# Patient Record
Sex: Female | Born: 1940 | Race: White | Hispanic: No | Smoking: Never smoker
Health system: Southern US, Community
[De-identification: ages and names within clinical notes are randomized; demographics above are authoritative.]

## PROBLEM LIST (undated history)

## (undated) DIAGNOSIS — F329 Major depressive disorder, single episode, unspecified: Secondary | ICD-10-CM

## (undated) DIAGNOSIS — I10 Essential (primary) hypertension: Secondary | ICD-10-CM

## (undated) DIAGNOSIS — E785 Hyperlipidemia, unspecified: Secondary | ICD-10-CM

## (undated) DIAGNOSIS — E669 Obesity, unspecified: Secondary | ICD-10-CM

## (undated) DIAGNOSIS — J189 Pneumonia, unspecified organism: Secondary | ICD-10-CM

## (undated) DIAGNOSIS — G4730 Sleep apnea, unspecified: Secondary | ICD-10-CM

## (undated) DIAGNOSIS — M1990 Unspecified osteoarthritis, unspecified site: Secondary | ICD-10-CM

## (undated) DIAGNOSIS — K219 Gastro-esophageal reflux disease without esophagitis: Secondary | ICD-10-CM

## (undated) DIAGNOSIS — K589 Irritable bowel syndrome without diarrhea: Secondary | ICD-10-CM

## (undated) DIAGNOSIS — K760 Fatty (change of) liver, not elsewhere classified: Secondary | ICD-10-CM

## (undated) DIAGNOSIS — F32A Depression, unspecified: Secondary | ICD-10-CM

## (undated) DIAGNOSIS — I639 Cerebral infarction, unspecified: Secondary | ICD-10-CM

## (undated) HISTORY — PX: KNEE SURGERY: SHX244

## (undated) HISTORY — PX: BLADDER SURGERY: SHX569

## (undated) HISTORY — PX: BREAST SURGERY: SHX581

## (undated) HISTORY — PX: TONSILLECTOMY: SUR1361

## (undated) HISTORY — DX: Major depressive disorder, single episode, unspecified: F32.9

## (undated) HISTORY — PX: CHOLECYSTECTOMY: SHX55

## (undated) HISTORY — DX: Depression, unspecified: F32.A

## (undated) HISTORY — PX: JOINT REPLACEMENT: SHX530

## (undated) HISTORY — PX: DEEP NECK LYMPH NODE BIOPSY / EXCISION: SUR126

## (undated) NOTE — *Deleted (*Deleted)
Physical Medicine and Rehabilitation Consult  Reason for Consult: Stroke with functional deficits.  Referring Physician: Dr Nelson Chimes.    HPI: [name] [name] is a 89 y.o. 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 She was in process of being worked up for embolic stroke due to unknown source with Holter monitor and sleep study.  CT head negative. MRI brain done revealing acute L-ACA territory infarcts. MRA brain showed severe stenosis L-A2/A3/ACC progressed from prior MRI and moderate stenosis P2 and P3 segments on left, moderate stenosis paraclinoid segment of B-ICA. Dr. Pearlean Brownie recommended changing Plavix to Brilinta/ASA for 3 months followed by ASA alone as well as stroke risk factors modification. Therapy evaluations completed revealing balance and proprioceptive deficits as well as suspicion of right visual field deficit affecting ADLs and mobility. CIR recommended due to functional decline.    Review of Systems  Constitutional: Negative for chills and fever.  HENT: Negative for hearing loss and tinnitus.   Eyes: Negative for blurred vision and double vision.  Respiratory: Negative for cough and shortness of breath.   Cardiovascular: Negative for chest pain and palpitations.  Gastrointestinal: Negative for constipation, diarrhea, heartburn and nausea.  Genitourinary: Negative for dysuria.  Musculoskeletal: Positive for joint pain (right knee/right ankle sprain with last stroke ). Negative for myalgias.  Skin: Negative for rash.  Neurological: Positive for focal weakness. Negative for dizziness and headaches.  Psychiatric/Behavioral: Positive for depression. Negative for memory loss.      Past Medical History:  Diagnosis Date  . Arthritis    knees  . Depression   . Fatty liver   . GERD  (gastroesophageal reflux disease)   . Hyperlipidemia   . Hypertension   . IBS (irritable bowel syndrome)   . Obesity   . Pneumonia   . Sleep apnea    uses CPAP  . Stroke (cerebrum) [facility])     Past Surgical History:  Procedure Laterality Date  . BLADDER SURGERY    . BREAST SURGERY     breast biopsy  . BROW LIFT Bilateral 04/14/2017   Procedure: BLEPHAROPLASTY UPPER EYELID WITH EXCESS SKIN;  Surgeon: Imagene Riches, MD;  Location: [facility];  Service: Ophthalmology;  Laterality: Bilateral;  . CATARACT EXTRACTION W/PHACO Left 02/05/2016   Procedure: CATARACT EXTRACTION PHACO AND INTRAOCULAR LENS PLACEMENT (IOC);  Surgeon: Galen Manila, MD;  Location: [facility];  Service: Ophthalmology;  Laterality: Left;  [place] 01:10 AP% 22.3 CDE 15.67 Fluid pack lot # [id number]  . CATARACT EXTRACTION W/PHACO Right 02/26/2016   Procedure: CATARACT EXTRACTION PHACO AND INTRAOCULAR LENS PLACEMENT (IOC);  Surgeon: Galen Manila, MD;  Location: [facility];  Service: Ophthalmology;  Laterality: Right;  [place] 57.4 AP% 24.0 CDE 13.75 Fluid Pack lot # [id number]  . CHOLECYSTECTOMY    . COLONOSCOPY WITH PROPOFOL N/A 12/18/2014   Procedure: COLONOSCOPY WITH PROPOFOL;  Surgeon: Scot Jun, MD;  Location: [facility];  Service: Endoscopy;  Laterality: N/A;  . DEEP NECK LYMPH NODE BIOPSY / EXCISION    . JOINT REPLACEMENT    . KNEE ARTHROPLASTY Left 06/20/2015   Procedure: COMPUTER ASSISTED TOTAL KNEE ARTHROPLASTY;  Surgeon: Donato Heinz, MD;  Location: [facility];  Service: Orthopedics;  Laterality: Left;  . PTOSIS REPAIR Bilateral 04/14/2017   Procedure: PTOSIS REPAIR RESECT EX;  Surgeon: Imagene Riches,  MD;  Location: [facility];  Service: Ophthalmology;  Laterality: Bilateral;  sleep apnea  . TONSILLECTOMY    . TOTAL HIP ARTHROPLASTY Right 07/21/2018   Procedure: TOTAL HIP ARTHROPLASTY ANTERIOR APPROACH;  Surgeon: Ollen Gross, MD;  Location: [facility];  Service: Orthopedics;  Laterality:  Right;    Family History  Problem Relation Age of Onset  . Heart attack Mother   . Dementia Father   . Heart attack Father 54  . Aortic aneurysm Brother   . Stroke Paternal Grandfather   . Colon cancer Neg Hx   . Breast cancer Neg Hx     Social History: Lives alone. Independent PTA with cane/walker--completed outpatient PT. Retired Technical brewer for [employer].   Has family in town who can check in after discharge. She  reports that she has never smoked. She has never used smokeless tobacco. She reports current alcohol use. She reports that she does not use drugs.    Allergies  Allergen Reactions  . Hydrocodone Nausea Only    Noted after surgery, may be able to tolerate with food    Medications Prior to Admission  Medication Sig Dispense Refill  . acetaminophen (TYLENOL) 500 MG tablet Take 500 mg by mouth every 6 (six) hours as needed for moderate pain.     [name] Kitchen atenolol (TENORMIN) 50 MG tablet TAKE ONE TABLET TWICE DAILY (Patient taking differently: Take 50 mg by mouth 2 (two) times daily. ) 180 tablet 3  . atorvastatin (LIPITOR) 80 MG tablet TAKE ONE TABLET BY MOUTH EVERY DAY AT 6PM (Patient taking differently: Take 80 mg by mouth every evening. ) 30 tablet 5  . Biotin 19147 MCG TABS Take 5,000 mcg by mouth daily.     . chlorpheniramine (CHLOR-TRIMETON) 4 MG tablet Take 4 mg by mouth daily as needed for allergies.    . cholecalciferol (VITAMIN D) 1000 UNITS tablet Take 1,000 Units by mouth daily.     . clopidogrel (PLAVIX) 75 MG tablet Take 1 tablet (75 mg total) by mouth daily. 90 tablet 3  . hydrOXYzine (ATARAX/VISTARIL) 10 MG tablet TAKE 1/2-1 TABLET BY MOUTH 3 TIMES DAILYAS NEEDED FOR ANXIETY (SEDATION CAUTION) (Patient taking differently: Take 5-10 mg by mouth 3 (three) times daily as needed (ANXIETY (SEDATION CAUTION)). ) 30 tablet 2  . ipratropium (ATROVENT) 0.06 % nasal spray one spray per nostril twice daily (up to three times daily) to help with runny nose (Patient taking  differently: Place 1 spray into both nostrils in the morning. ) 15 mL 5  . magnesium oxide (MAG-OX) 400 MG tablet Take 500 mg by mouth every other day.     . montelukast (SINGULAIR) 10 MG tablet Take 1 tablet (10 mg total) by mouth at bedtime. 30 tablet 5    Home: Home Living Family/patient expects to be discharged to:: Private residence Living Arrangements: Alone Available Help at Discharge: Family, Available 24 hours/day Type of Home: Other(Comment) (condo) Home Access: Level entry Home Layout: One level Bathroom Shower/Tub: Health visitor: Handicapped height Home Equipment: Environmental consultant - 2 wheels, Bedside commode, Wheelchair - manual, Grab bars - toilet, Grab bars - tub/shower, Information systems manager, Environmental consultant - 4 wheels  Functional History: Prior Function Level of Independence: Independent with assistive device(s) Comments: Utilizes rollator for mobility as needed. On good days goes without device Functional Status:  Mobility: Bed Mobility Overal bed mobility: Needs Assistance Bed Mobility: Supine to Sit Supine to sit: Min guard, HOB elevated General bed mobility comments: Pt required use of bed  rails with HOB elevated to advance legs off EOB and ascend trunk. Min guard assist for safety. Transfers Overall transfer level: Needs assistance Equipment used: Rolling walker (2 wheeled) Transfers: Sit to/from Stand Sit to Stand: Min assist General transfer comment: MinA for steadying with power up to stand. Extra time and cues provided for hand placement as pt tends to try to pull up on RW. Ambulation/Gait Ambulation/Gait assistance: Min assist Gait Distance (Feet): 150 Feet Assistive device: Rolling walker (2 wheeled) Gait Pattern/deviations: Step-through pattern, Decreased stride length, Narrow base of support, Trunk flexed General Gait Details: Pt ambulates with R leg externally rotated, L leg internally rotated, and excessively narrow BOS. Provided verbal and visual cues to  improve stance width, with min success. Pt displayed LOB resulting in minA to recover. Displays intermittent shaking at knees with gait. Gait velocity: decreased Gait velocity interpretation: <1.31 ft/sec, indicative of household ambulator    ADL: ADL Overall ADL's : Needs assistance/impaired Eating/Feeding: Set up, Sitting Grooming: Set up, Sitting Upper Body Bathing: Minimal assistance, Sitting Lower Body Bathing: Minimal assistance, Sit to/from stand, Sitting/lateral leans Upper Body Dressing : Minimal assistance, Sitting Lower Body Dressing: Moderate assistance, Sit to/from stand, Sitting/lateral leans Lower Body Dressing Details (indicate cue type and reason): to don panties with pad. Pt with difficulty threading panties over bil legs. Then required steadying assist when pulling them up. Toilet Transfer: Minimal assistance, RW, Ambulation, Cueing for safety, Cueing for sequencing Toileting- Clothing Manipulation and Hygiene: Set up, Sitting/lateral lean, Sit to/from stand Functional mobility during ADLs: Min guard, Rolling walker, Cueing for sequencing, Cueing for safety  Cognition: Cognition Overall Cognitive Status: Impaired/Different from baseline Orientation Level: Oriented X4 Cognition Arousal/Alertness: Awake/alert Behavior During Therapy: WFL for tasks assessed/performed Overall Cognitive Status: Impaired/Different from baseline Area of Impairment: Attention, Safety/judgement, Awareness, Problem solving Current Attention Level: Sustained Safety/Judgement: Decreased awareness of safety, Decreased awareness of deficits Awareness: Emergent Problem Solving: Difficulty sequencing, Requires verbal cues General Comments: pt with decreased awareness of deficits and requires increased cues and time to problem solve basic ADL commands   Blood pressure (!) 148/74, pulse 75, temperature 97.6 F (36.4 C), temperature source Oral, resp. rate 18, height 5\' 2"  (1.575 m), weight 76.7  kg, SpO2 97 %. Physical Exam Vitals and nursing note reviewed.  Constitutional:      Appearance: Normal appearance.     Comments: Labile --expressing concerns about her situation. NAD.   Neurological:     Mental Status: She is alert and oriented to person, place, and time.     Comments: Speech clear. Able to follow commands without difficulty.      Results for orders placed or performed during the hospital encounter of 04/18/20 (from the past 24 hour(s))  Hemoglobin A1c     Status: None   Collection Time: 04/19/20  9:33 AM  Result Value Ref Range   Hgb A1c MFr Bld 5.3 4.8 - 5.6 %   Mean Plasma Glucose 105.41 mg/dL  TSH     Status: None   Collection Time: 04/19/20  9:33 AM  Result Value Ref Range   TSH 1.235 0.350 - 4.500 uIU/mL  Platelet inhibition p2y12 (Not at [facility])     Status: Abnormal   Collection Time: 04/19/20 11:53 AM  Result Value Ref Range   Platelet Function  P2Y12 4 (L) 182 - 335 PRU  Basic metabolic panel     Status: Abnormal   Collection Time: 04/20/20  4:07 AM  Result Value Ref Range   Sodium 141 135 -  145 mmol/L   Potassium 3.6 3.5 - 5.1 mmol/L   Chloride 108 98 - 111 mmol/L   CO2 21 (L) 22 - 32 mmol/L   Glucose, Bld 110 (H) 70 - 99 mg/dL   BUN 9 8 - 23 mg/dL   Creatinine, Ser 1.61 0.44 - 1.00 mg/dL   Calcium 9.6 8.9 - 09.6 mg/dL   GFR, Estimated >04 >54 mL/min   Anion gap 12 5 - 15  CBC     Status: None   Collection Time: 04/20/20  4:07 AM  Result Value Ref Range   WBC 5.9 4.0 - 10.5 K/uL   RBC 4.43 3.87 - 5.11 MIL/uL   Hemoglobin 13.5 12.0 - 15.0 g/dL   HCT 09.8 36 - 46 %   MCV 93.2 80.0 - 100.0 fL   MCH 30.5 26.0 - 34.0 pg   MCHC 32.7 30.0 - 36.0 g/dL   RDW 11.9 14.7 - 82.9 %   Platelets 201 150 - 400 K/uL   nRBC 0.0 0.0 - 0.2 %  Magnesium     Status: None   Collection Time: 04/20/20  4:07 AM  Result Value Ref Range   Magnesium 2.0 1.7 - 2.4 mg/dL   CT HEAD WO CONTRAST  Result Date: 04/18/2020 CLINICAL DATA:  Numbness. EXAM: CT HEAD  WITHOUT CONTRAST TECHNIQUE: Contiguous axial images were obtained from the base of the skull through the vertex without intravenous contrast. COMPARISON:  February 16, 2020. FINDINGS: Brain: Mild chronic ischemic white matter disease is noted. 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 No mass effect or midline shift is noted. Ventricular size is within normal limits. There is no evidence of mass lesion, hemorrhage or acute infarction. Vascular: No hyperdense vessel or unexpected calcification. Skull: Normal. Negative for fracture or focal lesion. Sinuses/Orbits: No acute finding. Other: None. IMPRESSION: Mild chronic ischemic white matter disease. 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 No acute intracranial abnormality seen. Electronically Signed   By: Lupita Raider M.D.   On: 04/18/2020 13:45   MR ANGIO HEAD WO CONTRAST  Result Date: 04/18/2020 CLINICAL DATA:  Neuro deficit, acute stroke suspected. EXAM: MRA HEAD WITHOUT CONTRAST TECHNIQUE: Angiographic images of the Circle of Willis were obtained using MRA technique without intravenous contrast. COMPARISON:  MRA or of the brain February 16, 2020. FINDINGS: The bilateral vertebral arteries and the basilar artery have normal flow related enhancement. Luminal irregularities are noted along the posterior cerebral arteries with moderate stenosis at the P2P and P3 segments on the left. Luminal irregularities along the bilateral carotid siphons with moderate stenosis at the paraclinoid segment. Attenuation of the flow related enhancement at the left A2-A3 junction consistent with severe stenosis. The bilateral MCA and and right ACA vascular tree have normal flow related enhancement. The degree of stenosis of the left ACA has progressed from prior MRA. IMPRESSION: 1. 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 2. Moderate stenosis at the P2P and P3 segments on the left, and moderate stenosis at the paraclinoid  segment of the bilateral ICA. Electronically Signed   By: Baldemar Lenis M.D.   On: 04/18/2020 19:19   MR BRAIN WO CONTRAST  Result Date: 04/18/2020 CLINICAL DATA:  Numbness of right side EXAM: MRI HEAD WITHOUT CONTRAST TECHNIQUE: Multiplanar, multiecho pulse sequences of the brain and surrounding structures were obtained without intravenous contrast. COMPARISON:  02/16/2020 FINDINGS: Brain: There is patchy reduced diffusion involving the left superior frontal gyrus, cingulate gyrus, and body of the  corpus callosum. There is also some involvement of the parasagittal left parietal lobe. Left parietal focus susceptibility is again identified and probably reflects chronic microhemorrhage. Patchy and confluent areas of T2 hyperintensity in the supratentorial and pontine white matter are nonspecific but probably reflects stable moderate to advanced chronic microvascular ischemic changes. There is a chronic small vessel infarct of the right corona radiata. There is no intracranial mass or mass effect. Ventricles are stable in size. No hydrocephalus or extra-axial collection. Vascular: Major vessel flow voids at the skull base are preserved. Skull and upper cervical spine: Normal marrow signal is preserved. Sinuses/Orbits: Trace mucosal thick.  Bilateral lens replacement. Other: Sella is unremarkable.  Mastoid air cells are clear. IMPRESSION: Acute left ACA territory infarcts.  No hemorrhage or mass effect. Stable chronic findings detailed above including moderate to advanced chronic microvascular ischemic changes. Electronically Signed   By: Guadlupe Spanish M.D.   On: 04/18/2020 15:41    ***  Jacquelynn Cree, PA-C 04/20/2020

---

## 1997-10-06 ENCOUNTER — Ambulatory Visit (HOSPITAL_COMMUNITY): Admission: RE | Admit: 1997-10-06 | Discharge: 1997-10-06 | Payer: Self-pay | Admitting: Family Medicine

## 1999-02-19 ENCOUNTER — Encounter: Payer: Self-pay | Admitting: Family Medicine

## 1999-02-19 ENCOUNTER — Ambulatory Visit (HOSPITAL_COMMUNITY): Admission: RE | Admit: 1999-02-19 | Discharge: 1999-02-19 | Payer: Self-pay | Admitting: Family Medicine

## 2000-02-28 ENCOUNTER — Encounter: Payer: Self-pay | Admitting: Emergency Medicine

## 2000-02-28 ENCOUNTER — Emergency Department (HOSPITAL_COMMUNITY): Admission: EM | Admit: 2000-02-28 | Discharge: 2000-02-28 | Payer: Self-pay | Admitting: Emergency Medicine

## 2000-03-11 ENCOUNTER — Ambulatory Visit (HOSPITAL_COMMUNITY): Admission: RE | Admit: 2000-03-11 | Discharge: 2000-03-11 | Payer: Self-pay | Admitting: Family Medicine

## 2000-03-11 ENCOUNTER — Encounter: Payer: Self-pay | Admitting: Family Medicine

## 2000-09-29 ENCOUNTER — Emergency Department (HOSPITAL_COMMUNITY): Admission: EM | Admit: 2000-09-29 | Discharge: 2000-09-30 | Payer: Self-pay | Admitting: Emergency Medicine

## 2000-09-30 ENCOUNTER — Encounter: Payer: Self-pay | Admitting: Emergency Medicine

## 2001-03-18 ENCOUNTER — Ambulatory Visit (HOSPITAL_COMMUNITY): Admission: RE | Admit: 2001-03-18 | Discharge: 2001-03-18 | Payer: Self-pay | Admitting: Family Medicine

## 2001-03-18 ENCOUNTER — Encounter: Payer: Self-pay | Admitting: Family Medicine

## 2001-07-26 ENCOUNTER — Emergency Department (HOSPITAL_COMMUNITY): Admission: EM | Admit: 2001-07-26 | Discharge: 2001-07-26 | Payer: Self-pay | Admitting: Emergency Medicine

## 2001-11-19 ENCOUNTER — Encounter: Payer: Self-pay | Admitting: Family Medicine

## 2001-11-19 ENCOUNTER — Encounter: Admission: RE | Admit: 2001-11-19 | Discharge: 2001-11-19 | Payer: Self-pay | Admitting: Family Medicine

## 2002-04-01 ENCOUNTER — Encounter: Payer: Self-pay | Admitting: Family Medicine

## 2002-04-01 ENCOUNTER — Ambulatory Visit (HOSPITAL_COMMUNITY): Admission: RE | Admit: 2002-04-01 | Discharge: 2002-04-01 | Payer: Self-pay | Admitting: Family Medicine

## 2002-12-30 ENCOUNTER — Encounter: Payer: Self-pay | Admitting: Obstetrics and Gynecology

## 2002-12-30 ENCOUNTER — Encounter: Admission: RE | Admit: 2002-12-30 | Discharge: 2002-12-30 | Payer: Self-pay | Admitting: Obstetrics and Gynecology

## 2003-02-09 ENCOUNTER — Other Ambulatory Visit: Admission: RE | Admit: 2003-02-09 | Discharge: 2003-02-09 | Payer: Self-pay | Admitting: Obstetrics and Gynecology

## 2003-05-10 ENCOUNTER — Ambulatory Visit (HOSPITAL_COMMUNITY): Admission: RE | Admit: 2003-05-10 | Discharge: 2003-05-10 | Payer: Self-pay | Admitting: Family Medicine

## 2004-05-09 ENCOUNTER — Ambulatory Visit: Payer: Self-pay | Admitting: Family Medicine

## 2004-05-24 ENCOUNTER — Ambulatory Visit: Payer: Self-pay | Admitting: Family Medicine

## 2004-06-05 ENCOUNTER — Ambulatory Visit: Payer: Self-pay | Admitting: Family Medicine

## 2004-06-06 ENCOUNTER — Ambulatory Visit (HOSPITAL_COMMUNITY): Admission: RE | Admit: 2004-06-06 | Discharge: 2004-06-06 | Payer: Self-pay | Admitting: Obstetrics and Gynecology

## 2004-06-11 ENCOUNTER — Encounter: Admission: RE | Admit: 2004-06-11 | Discharge: 2004-09-09 | Payer: Self-pay | Admitting: Family Medicine

## 2004-06-20 ENCOUNTER — Other Ambulatory Visit: Admission: RE | Admit: 2004-06-20 | Discharge: 2004-06-20 | Payer: Self-pay | Admitting: Obstetrics and Gynecology

## 2004-09-09 ENCOUNTER — Ambulatory Visit: Payer: Self-pay | Admitting: Family Medicine

## 2004-10-03 ENCOUNTER — Ambulatory Visit: Payer: Self-pay | Admitting: Internal Medicine

## 2004-10-10 ENCOUNTER — Encounter: Admission: RE | Admit: 2004-10-10 | Discharge: 2005-01-08 | Payer: Self-pay | Admitting: Family Medicine

## 2004-11-15 ENCOUNTER — Ambulatory Visit: Payer: Self-pay | Admitting: Internal Medicine

## 2004-11-18 ENCOUNTER — Ambulatory Visit: Payer: Self-pay | Admitting: Family Medicine

## 2005-03-21 ENCOUNTER — Ambulatory Visit: Payer: Self-pay | Admitting: Internal Medicine

## 2005-07-25 ENCOUNTER — Ambulatory Visit (HOSPITAL_COMMUNITY): Admission: RE | Admit: 2005-07-25 | Discharge: 2005-07-25 | Payer: Self-pay | Admitting: Obstetrics and Gynecology

## 2005-07-29 ENCOUNTER — Ambulatory Visit: Payer: Self-pay | Admitting: Family Medicine

## 2005-09-03 ENCOUNTER — Ambulatory Visit: Payer: Self-pay | Admitting: Family Medicine

## 2005-09-16 ENCOUNTER — Ambulatory Visit: Payer: Self-pay | Admitting: Family Medicine

## 2005-10-02 ENCOUNTER — Ambulatory Visit: Payer: Self-pay | Admitting: Family Medicine

## 2005-11-13 ENCOUNTER — Other Ambulatory Visit: Admission: RE | Admit: 2005-11-13 | Discharge: 2005-11-13 | Payer: Self-pay | Admitting: Family Medicine

## 2005-11-13 ENCOUNTER — Encounter: Payer: Self-pay | Admitting: Family Medicine

## 2005-11-13 ENCOUNTER — Ambulatory Visit: Payer: Self-pay | Admitting: Family Medicine

## 2005-11-13 LAB — CONVERTED CEMR LAB: Pap Smear: NORMAL

## 2006-03-06 ENCOUNTER — Ambulatory Visit: Payer: Self-pay | Admitting: Family Medicine

## 2006-03-20 ENCOUNTER — Ambulatory Visit: Payer: Self-pay | Admitting: Family Medicine

## 2006-03-20 LAB — CONVERTED CEMR LAB: Hgb A1c MFr Bld: 5.9 %

## 2006-04-22 ENCOUNTER — Ambulatory Visit: Payer: Self-pay | Admitting: Family Medicine

## 2006-06-16 ENCOUNTER — Encounter: Payer: Self-pay | Admitting: Orthopedic Surgery

## 2006-06-26 ENCOUNTER — Encounter: Payer: Self-pay | Admitting: Orthopedic Surgery

## 2006-07-24 ENCOUNTER — Ambulatory Visit: Payer: Self-pay | Admitting: Family Medicine

## 2006-07-28 ENCOUNTER — Ambulatory Visit (HOSPITAL_COMMUNITY): Admission: RE | Admit: 2006-07-28 | Discharge: 2006-07-28 | Payer: Self-pay | Admitting: Family Medicine

## 2006-08-14 ENCOUNTER — Encounter: Payer: Self-pay | Admitting: Family Medicine

## 2006-08-14 DIAGNOSIS — E119 Type 2 diabetes mellitus without complications: Secondary | ICD-10-CM | POA: Insufficient documentation

## 2006-08-14 DIAGNOSIS — L719 Rosacea, unspecified: Secondary | ICD-10-CM | POA: Insufficient documentation

## 2006-08-14 DIAGNOSIS — K219 Gastro-esophageal reflux disease without esophagitis: Secondary | ICD-10-CM | POA: Insufficient documentation

## 2006-08-14 DIAGNOSIS — E7800 Pure hypercholesterolemia, unspecified: Secondary | ICD-10-CM | POA: Insufficient documentation

## 2006-08-14 DIAGNOSIS — F411 Generalized anxiety disorder: Secondary | ICD-10-CM | POA: Insufficient documentation

## 2006-08-14 DIAGNOSIS — K449 Diaphragmatic hernia without obstruction or gangrene: Secondary | ICD-10-CM | POA: Insufficient documentation

## 2006-08-14 DIAGNOSIS — I10 Essential (primary) hypertension: Secondary | ICD-10-CM | POA: Insufficient documentation

## 2006-08-14 DIAGNOSIS — M722 Plantar fascial fibromatosis: Secondary | ICD-10-CM | POA: Insufficient documentation

## 2006-08-14 DIAGNOSIS — K589 Irritable bowel syndrome without diarrhea: Secondary | ICD-10-CM | POA: Insufficient documentation

## 2006-08-14 DIAGNOSIS — M1711 Unilateral primary osteoarthritis, right knee: Secondary | ICD-10-CM | POA: Insufficient documentation

## 2006-08-14 DIAGNOSIS — B009 Herpesviral infection, unspecified: Secondary | ICD-10-CM | POA: Insufficient documentation

## 2006-09-26 ENCOUNTER — Emergency Department: Payer: Self-pay | Admitting: Emergency Medicine

## 2006-09-26 ENCOUNTER — Other Ambulatory Visit: Payer: Self-pay

## 2006-09-29 ENCOUNTER — Ambulatory Visit: Payer: Self-pay | Admitting: Internal Medicine

## 2006-10-06 ENCOUNTER — Ambulatory Visit: Payer: Self-pay | Admitting: Gastroenterology

## 2006-10-21 ENCOUNTER — Encounter: Payer: Self-pay | Admitting: Family Medicine

## 2006-11-10 ENCOUNTER — Encounter: Payer: Self-pay | Admitting: Orthopedic Surgery

## 2006-11-24 ENCOUNTER — Encounter: Payer: Self-pay | Admitting: Orthopedic Surgery

## 2006-12-01 ENCOUNTER — Encounter: Payer: Self-pay | Admitting: Family Medicine

## 2006-12-01 ENCOUNTER — Encounter (INDEPENDENT_AMBULATORY_CARE_PROVIDER_SITE_OTHER): Payer: Self-pay | Admitting: General Surgery

## 2006-12-01 ENCOUNTER — Ambulatory Visit (HOSPITAL_COMMUNITY): Admission: RE | Admit: 2006-12-01 | Discharge: 2006-12-02 | Payer: Self-pay | Admitting: General Surgery

## 2007-08-11 ENCOUNTER — Ambulatory Visit (HOSPITAL_COMMUNITY): Admission: RE | Admit: 2007-08-11 | Discharge: 2007-08-11 | Payer: Self-pay | Admitting: Family Medicine

## 2007-08-16 ENCOUNTER — Encounter (INDEPENDENT_AMBULATORY_CARE_PROVIDER_SITE_OTHER): Payer: Self-pay | Admitting: *Deleted

## 2008-08-14 ENCOUNTER — Ambulatory Visit (HOSPITAL_COMMUNITY): Admission: RE | Admit: 2008-08-14 | Discharge: 2008-08-14 | Payer: Self-pay | Admitting: Endocrinology

## 2008-11-16 ENCOUNTER — Ambulatory Visit: Payer: Self-pay

## 2008-12-01 ENCOUNTER — Ambulatory Visit: Payer: Self-pay | Admitting: Internal Medicine

## 2009-04-03 ENCOUNTER — Ambulatory Visit: Payer: Self-pay | Admitting: Unknown Physician Specialty

## 2009-04-10 ENCOUNTER — Ambulatory Visit: Payer: Self-pay | Admitting: Unknown Physician Specialty

## 2009-05-10 DIAGNOSIS — I10 Essential (primary) hypertension: Secondary | ICD-10-CM | POA: Insufficient documentation

## 2009-05-10 DIAGNOSIS — E669 Obesity, unspecified: Secondary | ICD-10-CM | POA: Insufficient documentation

## 2009-05-10 DIAGNOSIS — G43909 Migraine, unspecified, not intractable, without status migrainosus: Secondary | ICD-10-CM | POA: Insufficient documentation

## 2009-05-10 DIAGNOSIS — L940 Localized scleroderma [morphea]: Secondary | ICD-10-CM | POA: Insufficient documentation

## 2009-08-16 ENCOUNTER — Ambulatory Visit (HOSPITAL_COMMUNITY): Admission: RE | Admit: 2009-08-16 | Discharge: 2009-08-16 | Payer: Self-pay | Admitting: Family Medicine

## 2009-08-23 DIAGNOSIS — G4700 Insomnia, unspecified: Secondary | ICD-10-CM | POA: Insufficient documentation

## 2009-08-30 ENCOUNTER — Telehealth: Payer: Self-pay | Admitting: Internal Medicine

## 2009-11-08 DIAGNOSIS — R9431 Abnormal electrocardiogram [ECG] [EKG]: Secondary | ICD-10-CM | POA: Insufficient documentation

## 2009-11-30 ENCOUNTER — Telehealth: Payer: Self-pay | Admitting: Internal Medicine

## 2009-12-06 DIAGNOSIS — R599 Enlarged lymph nodes, unspecified: Secondary | ICD-10-CM | POA: Insufficient documentation

## 2009-12-24 ENCOUNTER — Ambulatory Visit (HOSPITAL_BASED_OUTPATIENT_CLINIC_OR_DEPARTMENT_OTHER): Admission: RE | Admit: 2009-12-24 | Discharge: 2009-12-24 | Payer: Self-pay | Admitting: General Surgery

## 2010-06-27 NOTE — Op Note (Signed)
Summary: Operative Report  Operative Report   Imported By: Eleonore Chiquito 12/16/2006 14:02:08  _____________________________________________________________________  External Attachment:    Type:   Image     Comment:   External Document

## 2010-06-27 NOTE — Miscellaneous (Signed)
Summary: mammo results  Clinical Lists Changes  Observations: Added new observation of MAMMO DUE: 05/2008 (08/16/2007 12:57) Added new observation of MAMMOGRAM: normal (08/16/2007 12:57)       Preventive Care Screening  Mammogram:    Date:  08/16/2007    Next Due:  05/2008    Results:  normal

## 2010-06-27 NOTE — Progress Notes (Signed)
Summary: Medication   Phone Note Call from Patient Call back at Home Phone [phone] Call back at [phone] Cell   Caller: Patient Call For: Dr. Marina Goodell Reason for Call: Talk to Nurse Summary of Call: pt. iwants to know the dosage she is suppose to be taking for the Metamucil Initial call taken by: Karna Christmas,  November 30, 2009 11:53 AM  Follow-up for Phone Call        Was using metamucil p.r.n. and stool would get pasty.Instructed to use daily and mix in 14 oz. of h20 followed by a extra glass of fliuds. Follow-up by: Teryl Lucy RN,  November 30, 2009 1:07 PM

## 2010-06-27 NOTE — Progress Notes (Signed)
Summary: triage / hemorrhoids  Medications Added ANAMANTLE HC 3-0.5 % KIT (LIDOCAINE-HYDROCORTISONE ACE) Use as instructed       Phone Note Call from Patient Call back at [phone] CELL   Caller: Patient Call For: [name] [name] Reason for Call: Talk to Nurse Summary of Call: Patient states that she has loose bms, hemorroids, and just feels irritated on her bottom wants to know what to do states that she has been using Preparation H but it's not helping. Initial call taken by: Tawni Levy,  August 30, 2009 9:51 AM  Follow-up for Phone Call        Message left to call back   Teryl Lucy RN  August 30, 2009 10:01 AM Pt. went on trip last week and started having soft pasty stools which makes her feel like she can't evacuate completely.Has skin irritation around rectum and can feel a hemorrhoid. Sm amt BRB on tissue this am. Follow-up by: Teryl Lucy RN,  August 30, 2009 10:18 AM  Additional Follow-up for Phone Call Additional follow up Details #1::        #1 Metamucil #2 sitz baths #3 AnaMantle HC p.r.n. Additional Follow-up by: Hilarie Fredrickson MD,  August 30, 2009 10:30 AM    Additional Follow-up for Phone Call Additional follow up Details #2::    Pt. ntfd. of DrPerry's orders. Follow-up by: Teryl Lucy RN,  August 30, 2009 10:38 AM  New/Updated Medications: ANAMANTLE HC 3-0.5 % KIT (LIDOCAINE-HYDROCORTISONE ACE) Use as instructed Prescriptions: ANAMANTLE HC 3-0.5 % KIT (LIDOCAINE-HYDROCORTISONE ACE) Use as instructed  #1 x 1   Entered by:   Teryl Lucy RN   Authorized by:   Hilarie Fredrickson MD   Signed by:   Teryl Lucy RN on 08/30/2009   Method used:   Electronically to        [id number] (retail)       [address]       [place], [place]  [address]       Ph: [phone]       Fax: [phone]   RxID:   [id number]

## 2010-07-16 ENCOUNTER — Other Ambulatory Visit (HOSPITAL_COMMUNITY): Payer: Self-pay | Admitting: Obstetrics and Gynecology

## 2010-07-16 DIAGNOSIS — Z1231 Encounter for screening mammogram for malignant neoplasm of breast: Secondary | ICD-10-CM

## 2010-08-20 ENCOUNTER — Ambulatory Visit (HOSPITAL_COMMUNITY)
Admission: RE | Admit: 2010-08-20 | Discharge: 2010-08-20 | Disposition: A | Discharge status: Home / Self Care | Payer: Medicare Other | Source: Ambulatory Visit | Attending: Obstetrics and Gynecology | Admitting: Obstetrics and Gynecology

## 2010-08-20 DIAGNOSIS — Z1231 Encounter for screening mammogram for malignant neoplasm of breast: Secondary | ICD-10-CM | POA: Insufficient documentation

## 2010-10-08 NOTE — Assessment & Plan Note (Signed)
[facility]                         GASTROENTEROLOGY OFFICE NOTE   NAME:[name], [name] BLOW                       MRN:          [id number]  DATE:09/29/2006                            DOB:          1940/08/20    OFFICE CONSULTATION NOTE:  Patient is self-referred.   REASON FOR CONSULTATION:  Abdominal pain.   HISTORY:  This is a 70 year old white female with a history of  gastroesophageal reflux disease, irritable bowel syndrome, hypertension,  dyslipidemia, and obesity, who presents herself for evaluation of  abdominal pain.  Patient was last evaluated in this office on March 13, 2004.  At that time, she was experiencing reflux symptoms with  regurgitation and occasional breakthrough pyrosis.  She was continued on  Protonix 40 mg b.i.d. and asked to adhere to reflux precautions with  attention to weight loss, elevation of head of bed, and avoiding large  or late meals.  She was to follow up in one year.  She has not been seen  in the office since that time.  She did, however, undergo screening  colonoscopy on November 15, 2004.  This was entirely normal.  Her last upper  endoscopy was performed in 2001.  She was found to have a peptic  stricture and small hiatal hernia only.  Patient had gone off her proton  pump inhibitors and was taking over-the-counter antacids and Zantac.  On  the evening of May 3rd, she developed rather severe epigastric pain  which radiated to the left upper quadrant and back.  She was also having  some chest discomfort, for which she went to the [facility] emergency room.  EKG revealed no acute changes.  Cardiac  enzymes were normal.  Her problems were felt to be due to reflux  disease.  She was prescribed Protonix and Reglan.  Since that time, her  symptoms have improved, although incompletely.  She is wondering if it  might be her gallbladder.  She does have some discomfort.  Patient has  had intermittent  problems with regurgitation.  She has been unsuccessful  at losing weight.  As a matter of fact, she continues to gain weight.  She notices more problems with regurgitation at night after larger  meals.   CURRENT MEDICATIONS:  1. Cozaar 100 mg daily.  2. Lopressor 50 mg b.i.d.  3. Vitamin A.  4. Vitamin C.  5. Protonix 40 mg b.i.d., recently started.  6. Calcium.  7. Aspirin 81 mg.  8. Folic acid.  9. Omega III.  10.Tums p.r.n.  11.Zantac p.r.n.  12.Reglan, recently started.   PHYSICAL EXAMINATION:  GENERAL:  A well-appearing female in no acute  distress.  VITAL SIGNS:  Blood pressure 160/90, heart rate 72.  Weight is 185.8  pounds (increase of 5 pounds).  HEENT:  Sclerae are anicteric.  Conjunctivae are pink.  Oral mucosa is  intact.  LYMPH:  No adenopathy.  LUNGS:  Clear.  HEART:  Regular.  ABDOMEN:  Obese and soft without tenderness, mass, or hernia.  Good  bowel sounds heard.   LABORATORY:  The laboratories from the  [facility]  on Sep 26, 2006 reveal unremarkable CBC.  Urinalysis was unremarkable.  Patient's metabolic panel revealed a mildly elevated glucose and  calcium.  Her lipase was a fraction above the upper limits of normal.  Liver tests were entirely normal.   IMPRESSION:  1. Recent symptoms most consistent with gastroesophageal reflux      disease after having been off medical therapy.  Improved after      resumption of medical therapy.  2. Irritable bowel syndrome, ongoing.   RECOMMENDATIONS:  1. Continue proton pump inhibitor therapy.  2. Reflux precautions with attention to weight loss.  Patient tells me      she has joined [employer] and is going to get involved with      physical therapy and exercise.  3. Schedule abdominal ultrasound to rule out gallstones.If ultrasound      negative, then follow up as needed.  4. She will continue her      general medical care with Dr. Milinda [name].     Wilhemina Bonito. Marina Goodell, MD  Electronically  Signed    JNP/MedQ  DD: 09/29/2006  DT: 09/29/2006  Job #: [id number]   cc:   [name] A. Milinda Antis, MD

## 2010-10-08 NOTE — Op Note (Signed)
NAMEBREIGH, ANNETT                ACCOUNT NO.:  [id number]   MEDICAL RECORD NO.:  [id number]          PATIENT TYPE:  OIB   LOCATION:  1418                         FACILITY:  [facility]   PHYSICIAN:  Anselm Pancoast. Weatherly, M.D.DATE OF BIRTH:  05-19-1941   DATE OF PROCEDURE:  12/01/2006  DATE OF DISCHARGE:                               OPERATIVE REPORT   PREOPERATIVE DIAGNOSIS:  Chronic cholecystitis with stones.   POSTOPERATIVE DIAGNOSIS:  Chronic cholecystitis with stones.   OPERATION:  Laparoscopic cholecystectomy.   SURGEON:  Anselm Pancoast. Zachery Dakins, M.D.   ASSISTANT:  Sharlet Salina T. Hoxworth, M.D.   HISTORY:  Maily Debarge is a 70 year old female who was referred to our  office by Dr. Yancey Flemings after she had been evaluated for epigastric  discomfort.  She has had an extensive workup. CT was performed which  showed a prominent liver.  The patient is 5 feet 2 inches and she weighs  190 pounds.  She originally saw Dr. Gerrit Friends, but because of  his schedule  was such that he could not get her on promptly, I had cared for a family  member and they asked to see me.  There had been an ultrasound that  showed questionable coarseness of the liver parenchyma. Her liver  function studies are normal and the consideration of a possible liver  biopsy had been entertained. The patient presents here today. She has  normal liver function studies as well as a normal CBC and I discussed  with her that we would look at the liver.  If the liver looked perfectly  normal on inspection, I would question the wisdom of proceed with a  liver biopsy as this is unlikely since she has got normal liver studies.  The patient is in agreement with this and preoperatively she was given  Unasyn.   DESCRIPTION OF PROCEDURE:  She has PAS stockings and was taken to the  operative suite.  Induction of general anesthesia by endotracheal tube,  oral tube into the stomach.  The abdomen was prepped with Betadine  surgical scrub  solution and draped in a sterile manner.  A small  incision below the umbilicus was made. The subcutaneous fatty tissue is  about 4 cm thick.  The fascia, which was thinned out, was identified and  picked up between two Kochers and a small opening was made.  There  appears to be a weakness at the umbilicus and later, on looking on the  inside, you could see there was a little subclinical umbilical hernia.  We put traction sutures superiorly and inferiorly and then opened the  peritoneum and slid the Hassan cannula in.  On inspection, the liver  looks perfectly normal, maybe a little bit of excess fatty tissue but  certainly no nodules or anything of any concern, and pictures were taken  of the liver and gallbladder.  The upper 10 mm trocar was placed in the  subxiphoid area and the two lateral 5 mm trocars were placed by Dr.  Johna Sheriff.   The gallbladder was retracted upward and outward and the peritoneum over  the proximal  portion of the gallbladder was opened. The cystic duct and  its junction with the gallbladder was identified and then a little clip  placed across the junction.  A small opening was made proximally, a Cook  catheter introduced, held in place with a clip, and x-ray was obtained.  We threaded the catheter down about 3 cm and it was really right in the  cystic duct common bile duct junction, there was good flow into the  duodenum.  The intrahepatic radicals filled nicely.  The catheter was  removed, the cystic duct was triply clipped and divided. The cystic  artery was identified, doubly clipped proximally, singly distally, and  divided.  Then, the gallbladder was removed from its bed with hook  electrocautery.  There was a little vessel on the peritoneum inferiorly  that was clipped and then the gallbladder was placed in an EndoCatch  bag.   The camera was switched to the upper 10 mm trocar and then the bag  containing the gallbladder removed from the fascial defect.   There was  about 1.5 cm stone within the gallbladder, everything was sent intact to  pathology.  The fascia at the umbilicus, I put two figure-of-eight  sutures in the fascia in addition to the superior and inferior U stitch  and looking from the inside, it appears that the fascia is closed and,  of course as we had noted, there was a little bit of a bulge at the  umbilicus that has got a little fatty preperitoneal tissue within it.  The fascia was anesthetized with Marcaine with adrenalin, the other  ports had been anesthetized when they were inserted.  The subcutaneous  tissue was closed with 4-0 Vicryl.  Benzoin and Steri-Strips on the  skin.  The patient tolerated the procedure nicely and wants to spend the  night.  She should be able to go home tomorrow.  She is supposed to be  doing some rehab with her knee under Dr. Jeannetta Ellis instructions and she  can resume that just as soon as abdominal pain permits.           ______________________________  Anselm Pancoast. Zachery Dakins, M.D.     WJW/MEDQ  D:  12/01/2006  T:  12/01/2006  Job:  [id number]   cc:   Marne A. Tower, MD  [address] [place], [place] [address]   Wilhemina Bonito. Marina Goodell, MD  [address]  [place]  [place] [address]

## 2011-02-13 ENCOUNTER — Ambulatory Visit: Payer: Self-pay | Admitting: Unknown Physician Specialty

## 2011-02-14 LAB — PATHOLOGY REPORT

## 2011-03-11 LAB — CBC
HCT: 42.1
Hemoglobin: 14.4
MCHC: 34.3
MCV: 89.2
Platelets: 332
RBC: 4.71
RDW: 13.3
WBC: 6.1

## 2011-03-11 LAB — DIFFERENTIAL
Basophils Absolute: 0
Basophils Relative: 1
Eosinophils Absolute: 0.2
Eosinophils Relative: 4
Lymphocytes Relative: 30
Lymphs Abs: 1.8
Monocytes Absolute: 0.5
Monocytes Relative: 8
Neutro Abs: 3.5
Neutrophils Relative %: 58

## 2011-03-11 LAB — COMPREHENSIVE METABOLIC PANEL
ALT: 18
AST: 21
Albumin: 3.7
Alkaline Phosphatase: 76
BUN: 13
CO2: 27
Calcium: 9.7
Chloride: 108
Creatinine, Ser: 0.94
GFR calc Af Amer: >60
GFR calc non Af Amer: 60 — ABNORMAL LOW
Glucose, Bld: 111 — ABNORMAL HIGH
Potassium: 4.5
Sodium: 139
Total Bilirubin: 0.7
Total Protein: 6.8

## 2011-07-31 ENCOUNTER — Other Ambulatory Visit (HOSPITAL_COMMUNITY): Payer: Self-pay | Admitting: Obstetrics and Gynecology

## 2011-07-31 DIAGNOSIS — Z1231 Encounter for screening mammogram for malignant neoplasm of breast: Secondary | ICD-10-CM

## 2011-08-26 ENCOUNTER — Ambulatory Visit (HOSPITAL_COMMUNITY)
Admission: RE | Admit: 2011-08-26 | Discharge: 2011-08-26 | Disposition: A | Discharge status: Home / Self Care | Payer: Medicare Other | Source: Ambulatory Visit | Attending: Obstetrics and Gynecology | Admitting: Obstetrics and Gynecology

## 2011-08-26 DIAGNOSIS — Z1231 Encounter for screening mammogram for malignant neoplasm of breast: Secondary | ICD-10-CM

## 2012-01-12 LAB — HM DEXA SCAN: HM Dexa Scan: NORMAL

## 2012-08-31 ENCOUNTER — Other Ambulatory Visit (HOSPITAL_COMMUNITY): Payer: Self-pay | Admitting: Family Medicine

## 2012-08-31 DIAGNOSIS — Z1231 Encounter for screening mammogram for malignant neoplasm of breast: Secondary | ICD-10-CM

## 2012-09-09 ENCOUNTER — Ambulatory Visit (HOSPITAL_COMMUNITY)
Admission: RE | Admit: 2012-09-09 | Discharge: 2012-09-09 | Disposition: A | Discharge status: Home / Self Care | Payer: Medicare Other | Source: Ambulatory Visit | Attending: Family Medicine | Admitting: Family Medicine

## 2012-09-09 DIAGNOSIS — Z1231 Encounter for screening mammogram for malignant neoplasm of breast: Secondary | ICD-10-CM | POA: Insufficient documentation

## 2012-09-13 ENCOUNTER — Ambulatory Visit (HOSPITAL_COMMUNITY): Payer: Medicare Other

## 2012-11-26 ENCOUNTER — Observation Stay: Payer: Self-pay | Admitting: Internal Medicine

## 2012-11-26 LAB — CBC
HCT: 41.7 % (ref 35.0–47.0)
HGB: 14.2 g/dL (ref 12.0–16.0)
MCH: 31 pg (ref 26.0–34.0)
MCHC: 34 g/dL (ref 32.0–36.0)
MCV: 91 fL (ref 80–100)
Platelet: 247 10*3/uL (ref 150–440)
RBC: 4.58 10*6/uL (ref 3.80–5.20)
RDW: 12.8 % (ref 11.5–14.5)
WBC: 8.2 10*3/uL (ref 3.6–11.0)

## 2012-11-26 LAB — COMPREHENSIVE METABOLIC PANEL
Albumin: 3.7 g/dL (ref 3.4–5.0)
Alkaline Phosphatase: 96 U/L (ref 50–136)
Anion Gap: 6 — ABNORMAL LOW (ref 7–16)
BUN: 14 mg/dL (ref 7–18)
Bilirubin,Total: 0.3 mg/dL (ref 0.2–1.0)
Calcium, Total: 9.1 mg/dL (ref 8.5–10.1)
Chloride: 110 mmol/L — ABNORMAL HIGH (ref 98–107)
Co2: 27 mmol/L (ref 21–32)
Creatinine: 0.88 mg/dL (ref 0.60–1.30)
EGFR (African American): >60
EGFR (Non-African Amer.): >60
Glucose: 97 mg/dL (ref 65–99)
Osmolality: 285 (ref 275–301)
Potassium: 4.3 mmol/L (ref 3.5–5.1)
SGOT(AST): 33 U/L (ref 15–37)
SGPT (ALT): 27 U/L (ref 12–78)
Sodium: 143 mmol/L (ref 136–145)
Total Protein: 6.7 g/dL (ref 6.4–8.2)

## 2012-11-26 LAB — URINALYSIS, COMPLETE
Bacteria: NONE SEEN
Bilirubin,UR: NEGATIVE
Blood: NEGATIVE
Glucose,UR: NEGATIVE mg/dL (ref 0–75)
Ketone: NEGATIVE
Leukocyte Esterase: NEGATIVE
Nitrite: NEGATIVE
Ph: 6 (ref 4.5–8.0)
Protein: NEGATIVE
RBC,UR: <1 /HPF (ref 0–5)
Specific Gravity: 1.009 (ref 1.003–1.030)
Squamous Epithelial: <1
WBC UR: 3 /HPF (ref 0–5)

## 2012-11-26 LAB — TROPONIN I: Troponin-I: <0.02 ng/mL

## 2012-11-27 DIAGNOSIS — I6789 Other cerebrovascular disease: Secondary | ICD-10-CM

## 2013-07-07 ENCOUNTER — Encounter (HOSPITAL_COMMUNITY): Payer: Self-pay | Admitting: Emergency Medicine

## 2013-07-07 ENCOUNTER — Emergency Department (HOSPITAL_COMMUNITY)
Admission: EM | Admit: 2013-07-07 | Discharge: 2013-07-07 | Disposition: A | Discharge status: Home / Self Care | Payer: Medicare HMO | Attending: Emergency Medicine | Admitting: Emergency Medicine

## 2013-07-07 ENCOUNTER — Emergency Department (HOSPITAL_COMMUNITY): Payer: Medicare HMO

## 2013-07-07 DIAGNOSIS — R110 Nausea: Secondary | ICD-10-CM | POA: Insufficient documentation

## 2013-07-07 DIAGNOSIS — E785 Hyperlipidemia, unspecified: Secondary | ICD-10-CM | POA: Insufficient documentation

## 2013-07-07 DIAGNOSIS — R61 Generalized hyperhidrosis: Secondary | ICD-10-CM | POA: Insufficient documentation

## 2013-07-07 DIAGNOSIS — K219 Gastro-esophageal reflux disease without esophagitis: Secondary | ICD-10-CM

## 2013-07-07 DIAGNOSIS — Z9089 Acquired absence of other organs: Secondary | ICD-10-CM | POA: Insufficient documentation

## 2013-07-07 DIAGNOSIS — Z79899 Other long term (current) drug therapy: Secondary | ICD-10-CM | POA: Insufficient documentation

## 2013-07-07 DIAGNOSIS — R0602 Shortness of breath: Secondary | ICD-10-CM | POA: Insufficient documentation

## 2013-07-07 DIAGNOSIS — R0789 Other chest pain: Secondary | ICD-10-CM | POA: Insufficient documentation

## 2013-07-07 DIAGNOSIS — Z7982 Long term (current) use of aspirin: Secondary | ICD-10-CM | POA: Insufficient documentation

## 2013-07-07 DIAGNOSIS — I10 Essential (primary) hypertension: Secondary | ICD-10-CM | POA: Insufficient documentation

## 2013-07-07 HISTORY — DX: Essential (primary) hypertension: I10

## 2013-07-07 LAB — TROPONIN I: Troponin I: <0.3 ng/mL (ref <0.30)

## 2013-07-07 LAB — CBC WITH DIFFERENTIAL/PLATELET
Basophils Absolute: 0 10*3/uL (ref 0.0–0.1)
Basophils Relative: 0 % (ref 0–1)
Eosinophils Absolute: 0.2 10*3/uL (ref 0.0–0.7)
Eosinophils Relative: 2 % (ref 0–5)
HCT: 42.7 % (ref 36.0–46.0)
Hemoglobin: 15 g/dL (ref 12.0–15.0)
Lymphocytes Relative: 17 % (ref 12–46)
Lymphs Abs: 1.5 10*3/uL (ref 0.7–4.0)
MCH: 31.9 pg (ref 26.0–34.0)
MCHC: 35.1 g/dL (ref 30.0–36.0)
MCV: 90.9 fL (ref 78.0–100.0)
Monocytes Absolute: 0.7 10*3/uL (ref 0.1–1.0)
Monocytes Relative: 9 % (ref 3–12)
Neutro Abs: 6.1 10*3/uL (ref 1.7–7.7)
Neutrophils Relative %: 72 % (ref 43–77)
Platelets: 219 10*3/uL (ref 150–400)
RBC: 4.7 MIL/uL (ref 3.87–5.11)
RDW: 12.8 % (ref 11.5–15.5)
WBC: 8.5 10*3/uL (ref 4.0–10.5)

## 2013-07-07 LAB — BASIC METABOLIC PANEL
BUN: 17 mg/dL (ref 6–23)
CO2: 23 mEq/L (ref 19–32)
Calcium: 9.7 mg/dL (ref 8.4–10.5)
Chloride: 102 mEq/L (ref 96–112)
Creatinine, Ser: 0.84 mg/dL (ref 0.50–1.10)
GFR calc Af Amer: 80 mL/min — ABNORMAL LOW (ref >90)
GFR calc non Af Amer: 69 mL/min — ABNORMAL LOW (ref >90)
Glucose, Bld: 114 mg/dL — ABNORMAL HIGH (ref 70–99)
Potassium: 4.2 mEq/L (ref 3.7–5.3)
Sodium: 139 mEq/L (ref 137–147)

## 2013-07-07 MED ORDER — OMEPRAZOLE 20 MG PO CPDR: 20.0000 mg | DELAYED_RELEASE_CAPSULE | Freq: Two times a day (BID) | ORAL | Status: DC

## 2013-07-07 MED ORDER — PANTOPRAZOLE SODIUM 40 MG IV SOLR
40.0000 mg | Freq: Once | INTRAVENOUS | Status: AC
  Administered 2013-07-07: 40 mg via INTRAVENOUS
  Filled 2013-07-07: qty 40

## 2013-07-07 MED ORDER — GI COCKTAIL ~~LOC~~
30.0000 mL | Freq: Once | ORAL | Status: AC
  Administered 2013-07-07: 30 mL via ORAL
  Filled 2013-07-07: qty 30

## 2013-07-07 MED ORDER — SUCRALFATE 1 G PO TABS: 1.0000 g | ORAL_TABLET | Freq: Four times a day (QID) | ORAL | Status: DC

## 2013-07-07 MED ORDER — ASPIRIN 81 MG PO CHEW
324.0000 mg | CHEWABLE_TABLET | Freq: Once | ORAL | Status: AC
  Administered 2013-07-07: 324 mg via ORAL
  Filled 2013-07-07: qty 4

## 2013-07-07 NOTE — ED Notes (Signed)
Pt reports that she has been having acid reflux since last night at 2330. Pt reports having chest tightness related to the reflux which radiates to her teeth. Pt has been taking tums throughout the night with some relief.  Pt alert x 4. NAD at this time.

## 2013-07-07 NOTE — ED Provider Notes (Signed)
CSN: [id number]     Arrival date & time 07/07/13  0453 History   First MD Initiated Contact with Patient 07/07/13 0502     Chief Complaint  Patient presents with  . Chest Pain  . Gastrophageal Reflux     (Consider location/radiation/quality/duration/timing/severity/associated sxs/prior Treatment) HPI 73 year old female presents to emergency department from home with complaint of reflux.  She reports onset around 9:30 last night of burning and pain in her chest.  She reports she is taking Marcello Moores multiple times without improvement in symptoms.  She has been woken several times with burning radiating pain.  She reports pain has been constant in her left upper chest and into her neck bilaterally.  Patient reports she has history of GERD.  She reports she ate meat low fat, and several sweepings last night, which she feels have triggered her flare.  She takes omeprazole once to twice a day as needed.   She reports during the last episode of chest pain./Reflux symptoms.  She became nauseated and diaphoretic and slightly short of breath.  No symptoms of that nature.  Currently.  She reports strong family history of coronary disease in multiple family members.  She denies any previous history of cardiac disease herself.  She has history of hypertension, hyperlipidemia, which she reports is well controlled.  She has history of cholecystectomy. Past Medical History  Diagnosis Date  . Hypertension    Past Surgical History  Procedure Laterality Date  . Cholecystectomy    . Knee surgery    . Breast surgery      breast byopsy   No family history on file. History  Substance Use Topics  . Smoking status: Never Smoker   . Smokeless tobacco: Not on file  . Alcohol Use: No   OB History   Grav Para Term Preterm Abortions TAB SAB Ect Mult Living                 Review of Systems   See History of Present Illness; otherwise all other systems are reviewed and negative  Allergies  Review of patient's  allergies indicates no known allergies.  Home Medications   Current Outpatient Rx  Name  Route  Sig  Dispense  Refill  . aspirin EC 81 MG tablet   Oral   Take 81 mg by mouth daily.         [name] Kitchen atenolol (TENORMIN) 50 MG tablet   Oral   Take 50 mg by mouth 2 (two) times daily.         . cholecalciferol (VITAMIN D) 1000 UNITS tablet   Oral   Take 1,000 Units by mouth daily.         . clobetasol ointment (TEMOVATE) 0.05 %   Topical   Apply 1 application topically daily as needed (for itching).         . Coenzyme Q10 (COQ-10) 200 MG CAPS   Oral   Take 1 capsule by mouth daily.         [name] Kitchen LORazepam (ATIVAN) 0.5 MG tablet   Oral   Take 0.5 mg by mouth every 8 (eight) hours as needed for anxiety.         [name] Kitchen omeprazole (PRILOSEC) 20 MG capsule   Oral   Take 20 mg by mouth daily.         . ranitidine (ZANTAC) 300 MG tablet   Oral   Take 300 mg by mouth daily.         [name] Kitchen  simvastatin (ZOCOR) 20 MG tablet   Oral   Take 20 mg by mouth daily.         . traZODone (DESYREL) 50 MG tablet   Oral   Take 25-50 mg by mouth at bedtime as needed for sleep.          BP 158/97  Temp(Src) 98.1 F (36.7 C) (Oral)  Resp 16  Ht 5' (1.524 m)  Wt 172 lb (78.019 kg)  BMI 33.59 kg/m2  SpO2 97% Physical Exam  Nursing note and vitals reviewed. Constitutional: She is oriented to person, place, and time. She appears well-developed and well-nourished. No distress.  HENT:  Head: Normocephalic and atraumatic.  Nose: Nose normal.  Mouth/Throat: Oropharynx is clear and moist.  Eyes: Conjunctivae and EOM are normal. Pupils are equal, round, and reactive to light.  Neck: Normal range of motion. Neck supple. No JVD present. No tracheal deviation present. No thyromegaly present.  Cardiovascular: Normal rate, regular rhythm, normal heart sounds and intact distal pulses.  Exam reveals no gallop and no friction rub.   No murmur heard. Pulmonary/Chest: Effort normal and breath sounds  normal. No stridor. No respiratory distress. She has no wheezes. She has no rales. She exhibits no tenderness.  Abdominal: Soft. Bowel sounds are normal. She exhibits no distension and no mass. There is no tenderness. There is no rebound and no guarding.  Musculoskeletal: Normal range of motion. She exhibits no edema and no tenderness.  Lymphadenopathy:    She has no cervical adenopathy.  Neurological: She is alert and oriented to person, place, and time. She exhibits normal muscle tone. Coordination normal.  Skin: Skin is warm and dry. No rash noted. No erythema. No pallor.  Psychiatric: She has a normal mood and affect. Her behavior is normal. Judgment and thought content normal.    ED Course  Procedures (including critical care time) Labs Review Labs Reviewed  BASIC METABOLIC PANEL - Abnormal; Notable for the following:    Glucose, Bld 114 (*)    GFR calc non Af Amer 69 (*)    GFR calc Af Amer 80 (*)    All other components within normal limits  CBC WITH DIFFERENTIAL  TROPONIN I   Imaging Review Dg Chest Port 1 View  07/07/2013   CLINICAL DATA:  Left upper chest pain.  EXAM: PORTABLE CHEST - 1 VIEW  COMPARISON:  DG CHEST 2 VIEW dated 12/01/2006  FINDINGS: Tortuous thoracic aorta. Cardiopericardial silhouette appears within normal limits. By basilar opacity is present, slightly greater on the left than right which appears to represent atelectasis. Thoracic scoliosis is noted. No airspace disease or pleural effusion. Monitoring leads project over the chest.  IMPRESSION: Suboptimal inspiration with basilar atelectasis   Electronically Signed   By: Dereck Ligas M.D.   On: 07/07/2013 05:39    EKG Interpretation    Date/Time:  Thursday July 07 2013 04:59:32 EST Ventricular Rate:  80 PR Interval:  188 QRS Duration: 90 QT Interval:  382 QTC Calculation: 441 R Axis:   23 Text Interpretation:  Sinus rhythm Abnormal R-wave progression, early transition Minimal ST depression, inferior  leads No old tracing to compare Confirmed by Chalise Pe  MD, Akasia Ahmad ([id number]) on 07/07/2013 5:24:15 AM            MDM   Final diagnoses:  GERD (gastroesophageal reflux disease)    72 year old female with chest pain.  Pain is burning in nature, and patient has strong history of reflux, however, given her risk factors, concern  for possible atypical presentation of ACS.  EKG with ST depressions in inferior leads.  We'll give GI cocktail, also give full strength aspirin, check labs, chest x-ray.  Patient has been ongoing symptoms since 9 PM.  Given length of persistence of symptoms, do not feel patient will need second troponin.  6:54 AM Patient reports complete resolution of symptoms.  Workup here unremarkable for ACS.  Plan to send patient home to followup with her primary care Dr. and/or gastroenterologist.      Kalman Drape, MD 07/07/13 [phone]

## 2013-07-07 NOTE — Discharge Instructions (Signed)
Chest Pain Observation It is often hard to give a specific diagnosis for the cause of chest pain. Among other possibilities your symptoms might be caused by inadequate oxygen delivery to your heart (angina). Angina that is not treated or evaluated can lead to a heart attack (myocardial infarction) or death. Blood tests, electrocardiograms, and X-rays may have been done to help determine a possible cause of your chest pain. After evaluation and observation, your health care provider has determined that it is unlikely your pain was caused by an unstable condition that requires hospitalization. However, a full evaluation of your pain may need to be completed, with additional diagnostic testing as directed. It is very important to keep your follow-up appointments. Not keeping your follow-up appointments could result in permanent heart damage, disability, or death. If there is any problem keeping your follow-up appointments, you must call your health care provider. HOME CARE INSTRUCTIONS  Due to the slight chance that your pain could be angina, it is important to follow your health care provider's treatment plan and also maintain a healthy lifestyle:  Maintain or work toward achieving a healthy weight.  Stay physically active and exercise regularly.  Decrease your salt intake.  Eat a balanced, healthy diet. Talk to a dietician to learn about heart healthy foods.  Increase your fiber intake by including whole grains, vegetables, fruits, and nuts in your diet.  Avoid situations that cause stress, anger, or depression.  Take medicines as advised by your health care provider. Report any side effects to your health care provider. Do not stop medicines or adjust the dosages on your own.  Quit smoking. Do not use nicotine patches or gum until you check with your health care provider.  Keep your blood pressure, blood sugar, and cholesterol levels within normal limits.  Limit alcohol intake to no more than  1 drink per day for women that are not pregnant and 2 drinks per day for men.  Do not abuse drugs. SEEK IMMEDIATE MEDICAL CARE IF: You have severe chest pain or pressure which may include symptoms such as:  You feel pain or pressure in you arms, neck, jaw, or back.  You have severe back or abdominal pain, feel sick to your stomach (nauseous), or throw up (vomit).  You are sweating profusely.  You are having a fast or irregular heartbeat.  You feel short of breath while at rest.  You notice increasing shortness of breath during rest, sleep, or with activity.  You have chest pain that does not get better after rest or after taking your usual medicine.  You wake from sleep with chest pain.  You are unable to sleep because you cannot breathe.  You develop a frequent cough or you are coughing up blood.  You feel dizzy, faint, or experience extreme fatigue.  You develop severe weakness, dizziness, fainting, or chills. Any of these symptoms may represent a serious problem that is an emergency. Do not wait to see if the symptoms will go away. Call your local emergency services (911 in the [place] Do not drive yourself to the hospital. MAKE SURE YOU:  Understand these instructions.  Will watch your condition.  Will get help right away if you are not doing well or get worse. Document Released: 06/14/2010 Document Revised: 01/12/2013 Document Reviewed: 11/11/2012 [facility] Patient Information 2014 [place], [place].  Diet for Gastroesophageal Reflux Disease, Adult Reflux (acid reflux) is when acid from your stomach flows up into the esophagus. When acid comes in contact with the esophagus,  the acid causes irritation and soreness (inflammation) in the esophagus. When reflux happens often or so severely that it causes damage to the esophagus, it is called gastroesophageal reflux disease (GERD). Nutrition therapy can help ease the discomfort of GERD. FOODS OR DRINKS TO AVOID OR  LIMIT  Smoking or chewing tobacco. Nicotine is one of the most potent stimulants to acid production in the gastrointestinal tract.  Caffeinated and decaffeinated coffee and black tea.  Regular or low-calorie carbonated beverages or energy drinks (caffeine-free carbonated beverages are allowed).   Strong spices, such as black pepper, white pepper, red pepper, cayenne, curry powder, and chili powder.  Peppermint or spearmint.  Chocolate.  High-fat foods, including meats and fried foods. Extra added fats including oils, butter, salad dressings, and nuts. Limit these to less than 8 tsp per day.  Fruits and vegetables if they are not tolerated, such as citrus fruits or tomatoes.  Alcohol.  Any food that seems to aggravate your condition. If you have questions regarding your diet, call your caregiver or a registered dietitian. OTHER THINGS THAT MAY HELP GERD INCLUDE:   Eating your meals slowly, in a relaxed setting.  Eating 5 to 6 small meals per day instead of 3 large meals.  Eliminating food for a period of time if it causes distress.  Not lying down until 3 hours after eating a meal.  Keeping the head of your bed raised 6 to 9 inches (15 to 23 cm) by using a foam wedge or blocks under the legs of the bed. Lying flat may make symptoms worse.  Being physically active. Weight loss may be helpful in reducing reflux in overweight or obese adults.  Wear loose fitting clothing EXAMPLE MEAL PLAN This meal plan is approximately 2,000 calories based on [email or web address] meal planning guidelines. Breakfast   cup cooked oatmeal.  1 cup strawberries.  1 cup low-fat milk.  1 oz almonds. Snack  1 cup cucumber slices.  6 oz yogurt (made from low-fat or fat-free milk). Lunch  2 slice whole-wheat bread.  2 oz sliced [place].  2 tsp mayonnaise.  1 cup blueberries.  1 cup snap peas. Snack  6 whole-wheat crackers.  1 oz string cheese. Dinner   cup brown rice.  1  cup mixed veggies.  1 tsp olive oil.  3 oz grilled fish. Document Released: 05/12/2005 Document Revised: 08/04/2011 Document Reviewed: 03/28/2011 [facility] Patient Information 2014 [place], [place].  Gastroesophageal Reflux Disease, Adult Gastroesophageal reflux disease (GERD) happens when acid from your stomach flows up into the esophagus. When acid comes in contact with the esophagus, the acid causes soreness (inflammation) in the esophagus. Over time, GERD may create small holes (ulcers) in the lining of the esophagus. CAUSES   Increased body weight. This puts pressure on the stomach, making acid rise from the stomach into the esophagus.  Smoking. This increases acid production in the stomach.  Drinking alcohol. This causes decreased pressure in the lower esophageal sphincter (valve or ring of muscle between the esophagus and stomach), allowing acid from the stomach into the esophagus.  Late evening meals and a full stomach. This increases pressure and acid production in the stomach.  A malformed lower esophageal sphincter. Sometimes, no cause is found. SYMPTOMS   Burning pain in the lower part of the mid-chest behind the breastbone and in the mid-stomach area. This may occur twice a week or more often.  Trouble swallowing.  Sore throat.  Dry cough.  Asthma-like symptoms including chest tightness, shortness of breath, or  wheezing. DIAGNOSIS  Your caregiver may be able to diagnose GERD based on your symptoms. In some cases, X-rays and other tests may be done to check for complications or to check the condition of your stomach and esophagus. TREATMENT  Your caregiver may recommend over-the-counter or prescription medicines to help decrease acid production. Ask your caregiver before starting or adding any new medicines.  HOME CARE INSTRUCTIONS   Change the factors that you can control. Ask your caregiver for guidance concerning weight loss, quitting smoking, and alcohol  consumption.  Avoid foods and drinks that make your symptoms worse, such as:  Caffeine or alcoholic drinks.  Chocolate.  Peppermint or mint flavorings.  Garlic and onions.  Spicy foods.  Citrus fruits, such as oranges, lemons, or limes.  Tomato-based foods such as sauce, chili, salsa, and pizza.  Fried and fatty foods.  Avoid lying down for the 3 hours prior to your bedtime or prior to taking a nap.  Eat small, frequent meals instead of large meals.  Wear loose-fitting clothing. Do not wear anything tight around your waist that causes pressure on your stomach.  Raise the head of your bed 6 to 8 inches with wood blocks to help you sleep. Extra pillows will not help.  Only take over-the-counter or prescription medicines for pain, discomfort, or fever as directed by your caregiver.  Do not take aspirin, ibuprofen, or other nonsteroidal anti-inflammatory drugs (NSAIDs). SEEK IMMEDIATE MEDICAL CARE IF:   You have pain in your arms, neck, jaw, teeth, or back.  Your pain increases or changes in intensity or duration.  You develop nausea, vomiting, or sweating (diaphoresis).  You develop shortness of breath, or you faint.  Your vomit is green, yellow, black, or looks like coffee grounds or blood.  Your stool is red, bloody, or black. These symptoms could be signs of other problems, such as heart disease, gastric bleeding, or esophageal bleeding. MAKE SURE YOU:   Understand these instructions.  Will watch your condition.  Will get help right away if you are not doing well or get worse. Document Released: 02/19/2005 Document Revised: 08/04/2011 Document Reviewed: 11/29/2010 [facility] Patient Information 2014 [place], [place].

## 2013-08-12 ENCOUNTER — Other Ambulatory Visit (HOSPITAL_COMMUNITY): Payer: Self-pay | Admitting: Family Medicine

## 2013-08-12 DIAGNOSIS — Z1231 Encounter for screening mammogram for malignant neoplasm of breast: Secondary | ICD-10-CM

## 2013-09-12 ENCOUNTER — Ambulatory Visit (HOSPITAL_COMMUNITY)
Admission: RE | Admit: 2013-09-12 | Discharge: 2013-09-12 | Disposition: A | Discharge status: Home / Self Care | Payer: Medicare HMO | Source: Ambulatory Visit | Attending: Family Medicine | Admitting: Family Medicine

## 2013-09-12 DIAGNOSIS — Z1231 Encounter for screening mammogram for malignant neoplasm of breast: Secondary | ICD-10-CM

## 2013-11-17 ENCOUNTER — Telehealth: Payer: Self-pay | Admitting: Family Medicine

## 2013-11-17 NOTE — Telephone Encounter (Signed)
Patient is asking if you'll see her as a new patient.  Her husband, Mosetta Putt, was your patient until he was put in a nursing home.  When she was told your first available appointment is in January, she asked if you could see her sooner.  She said she is having some emotional issues with her husbands illness.  She is a patient of [facility]. Patient said it's ok if she has to wait. Please advise.

## 2013-11-17 NOTE — Telephone Encounter (Signed)
30 min appointment, when possible.  Thanks.  

## 2013-12-09 NOTE — Telephone Encounter (Signed)
Patient called and cancelled her new patient appointment.  Patient said her husband still has Dr.Maloney as his Doctor and she doesn't feel comfortable switching while Dr.Maloney is still seeing her husband.  Patient said until that changes she wants to keep the communication open with Dr.Maloney.  Patient wants to keep the option open to switch to Dr.Duncan, if he doesn't mind.  Patient wanted to thank Dr.Duncan for letting her make the appointment.

## 2013-12-11 NOTE — Telephone Encounter (Signed)
Thanks. That is fine.  I'll see her when needed.

## 2013-12-15 ENCOUNTER — Ambulatory Visit: Payer: Medicare Other | Admitting: Family Medicine

## 2014-03-22 LAB — CBC AND DIFFERENTIAL
HCT: 45 % (ref 36–46)
Hemoglobin: 15.5 g/dL (ref 12.0–16.0)
Platelets: 252 10*3/uL (ref 150–399)

## 2014-03-30 LAB — HEMOGLOBIN A1C: Hgb A1c MFr Bld: 5.4 % (ref 4.0–6.0)

## 2014-05-30 ENCOUNTER — Encounter: Payer: Self-pay | Admitting: Internal Medicine

## 2014-05-30 LAB — HM COLONOSCOPY: HM Colonoscopy: NORMAL

## 2014-06-02 DIAGNOSIS — E780 Pure hypercholesterolemia: Secondary | ICD-10-CM | POA: Diagnosis not present

## 2014-06-02 LAB — HEPATIC FUNCTION PANEL
ALT: 15 U/L (ref 7–35)
AST: 17 U/L (ref 13–35)

## 2014-06-02 LAB — BASIC METABOLIC PANEL
BUN: 14 mg/dL (ref 4–21)
Creatinine: 0.9 mg/dL (ref 0.5–1.1)
Glucose: 104 mg/dL
Potassium: 4.5 mmol/L (ref 3.4–5.3)
Sodium: 142 mmol/L (ref 137–147)

## 2014-06-02 LAB — LIPID PANEL
Cholesterol: 169 mg/dL (ref 0–200)
HDL: 43 mg/dL (ref 35–70)
LDL Cholesterol: 98 mg/dL
Triglycerides: 142 mg/dL (ref 40–160)

## 2014-06-15 DIAGNOSIS — G4733 Obstructive sleep apnea (adult) (pediatric): Secondary | ICD-10-CM | POA: Diagnosis not present

## 2014-07-16 DIAGNOSIS — G4733 Obstructive sleep apnea (adult) (pediatric): Secondary | ICD-10-CM | POA: Diagnosis not present

## 2014-07-17 DIAGNOSIS — L814 Other melanin hyperpigmentation: Secondary | ICD-10-CM | POA: Diagnosis not present

## 2014-07-17 DIAGNOSIS — L718 Other rosacea: Secondary | ICD-10-CM | POA: Diagnosis not present

## 2014-07-17 DIAGNOSIS — B079 Viral wart, unspecified: Secondary | ICD-10-CM | POA: Diagnosis not present

## 2014-07-17 DIAGNOSIS — L738 Other specified follicular disorders: Secondary | ICD-10-CM | POA: Diagnosis not present

## 2014-07-17 DIAGNOSIS — L570 Actinic keratosis: Secondary | ICD-10-CM | POA: Diagnosis not present

## 2014-07-17 DIAGNOSIS — L720 Epidermal cyst: Secondary | ICD-10-CM | POA: Diagnosis not present

## 2014-08-14 DIAGNOSIS — H60541 Acute eczematoid otitis externa, right ear: Secondary | ICD-10-CM | POA: Diagnosis not present

## 2014-08-14 DIAGNOSIS — G4733 Obstructive sleep apnea (adult) (pediatric): Secondary | ICD-10-CM | POA: Diagnosis not present

## 2014-08-14 DIAGNOSIS — R040 Epistaxis: Secondary | ICD-10-CM | POA: Diagnosis not present

## 2014-08-27 ENCOUNTER — Encounter: Payer: Self-pay | Admitting: Internal Medicine

## 2014-09-14 DIAGNOSIS — G4733 Obstructive sleep apnea (adult) (pediatric): Secondary | ICD-10-CM | POA: Diagnosis not present

## 2014-09-15 NOTE — H&P (Signed)
PATIENT NAME:  [name] [name], MENEES MR#:  [id number] DATE OF BIRTH:  December 12, 1940  DATE OF ADMISSION:  11/26/2012  PRIMARY CARE PHYSICIAN: Jerrell Belfast, MD  CHIEF COMPLAINT: Headache with right-sided numbness and weakness.   HISTORY OF PRESENT ILLNESS: This is a 74 year old female who presents to the hospital with symptoms of headache and right-sided numbness and tingling and some lightheadedness. The patient said she woke up in her usual state of health, was doing fine. Shortly after she fixed her lunch, she felt a bit lightheaded and dizzy,  thought that she was almost going to pass out but did not. She also then developed a headache and had some right-sided numbness and right-sided weakness. She notified her daughter, who then brought her to the ER for further evaluation. Her symptoms have now completely resolved and she feels clinically much better. Hospitalist services were contacted for further treatment and evaluation for possible further treatment and work-up of a TIA/CVA. The patient presently denies a headache. No nausea. No vomiting. No chest pain. No shortness of breath. No abdominal pain. No numbness. No tingling. No other associated symptoms presently.   REVIEW OF SYSTEMS:    CONSTITUTIONAL: No documented fever. No weight gain. No weight loss.  EYES: No blurry or double vision.  EARS, NOSE, THROAT: No tinnitus. No postnasal drip. No redness of the oropharynx.  RESPIRATORY: No cough, no wheeze, no hemoptysis, no dyspnea.  CARDIOVASCULAR: No chest pain, no orthopnea, no palpitations, no syncope.  GASTROINTESTINAL: No nausea, no vomiting, no diarrhea, no abdominal pain, no melena or hematochezia.  GENITOURINARY: No dysuria. No  hematuria.  ENDOCRINE: No polyuria, nocturia, heat or cold intolerance.  HEMATOLOGIC: No anemia, no bruising, no bleeding.  INTEGUMENTARY: No rashes. No lesions.  MUSCULOSKELETAL: No arthritis. No swelling. No gout.  NEUROLOGIC: Positive numbness. No tingling. No  ataxia. No seizure-type activity.  PSYCHIATRIC: Positive anxiety. No insomnia. No ADD.   PAST MEDICAL HISTORY: Consistent with hypertension, hyperlipidemia, GERD, anxiety.   ALLERGIES: No known drug allergies.   SOCIAL HISTORY: No smoking. No alcohol abuse. No illicit drug abuse. Lives at home with her husband.   FAMILY HISTORY: Mother had macular degeneration, died from a sudden death. Father had Alzheimer's.   CURRENT MEDICATIONS: Atenolol 50 mg b.i.d., simvastatin 20 mg daily, ranitidine 300 mg daily, omeprazole 20 mg daily, clobetasol as needed, vitamin D 1000 international units daily, aspirin 81 mg daily and coenzyme Q 200 mg daily.   PHYSICAL EXAMINATION:  VITAL SIGNS: On admission, temperature is 98, pulse 66, respirations 18, blood pressure 133/68, sats 97% on room air.  GENERAL: She is a pleasant-appearing female in no apparent distress.  HEENT: Atraumatic, normocephalic. Extraocular muscles are intact. Pupils are equal and reactive to light. Sclerae anicteric. No conjunctival injection. No pharyngeal erythema.  NECK: Supple. There is no jugular venous distention. No bruits. No lymphadenopathy or thyromegaly.  HEART: Regular rate and rhythm. No murmurs, no rubs, no clicks.  LUNGS: Clear to auscultation bilaterally. No rales. No rhonchi. No wheezes.  ABDOMEN: Soft, flat, nontender, nondistended. Has good bowel sounds. No hepatosplenomegaly appreciated.  EXTREMITIES: No evidence of any cyanosis, clubbing or peripheral edema. Has +2 pedal and radial pulses bilaterally.  NEUROLOGIC: The patient is alert, awake, oriented x 3 with no focal motor or sensory deficits appreciated bilaterally.  SKIN: Moist and warm with no rashes appreciated.  LYMPHATIC: There is no cervical or axillary lymphadenopathy.   LABORATORY AND RADIOLOGICAL DATA: Serum glucose 97, BUN 14, creatinine 0.8, sodium 143, potassium 4.3,  chloride 110, bicarbonate 27. LFTs are within normal limits. Troponin is less than  0.02. White cell count 8.2, hemoglobin 14.2, hematocrit 41.7, platelet count 247. The patient's urinalysis is within normal limits.   The patient did have a CT of the head done without contrast, which showed no acute intracranial process. The patient also had a chest x-ray done on admission showing no acute cardiopulmonary disease.   ASSESSMENT AND PLAN: This is a 74 year old female with a history of hypertension, hyperlipidemia, gastroesophageal reflux disease, anxiety, who presents to the hospital with a headache and right-sided arm and leg numbness and weakness.  1.  Transient ischemic attack/cerebrovascular accident: This was likely the cause of the patient's symptoms of right-sided numbness and weakness. She is clinically asymptomatic and her symptoms have now resolved. Questionable if this is a true cerebrovascular accident or transient ischemic attack versus stress. The patient's husband was just recently diagnosed with Alzheimer's, so she is adjusting to that new diagnosis. She takes care of him at home. Her initial CT head is negative. She has a nonfocal neurological exam. For now, I will observe her on off-unit telemetry, give her aspirin, follow every-4-hour neuro checks. Get an MRI of her brain, a carotid duplex and a 2-dimensional echocardiogram.  2.  Hypertension: The patient is presently hemodynamically stable. Continue atenolol.  3.  Hyperlipidemia: Continue simvastatin. 4.  Gastroesophageal reflux disease: Continue with omeprazole and ranitidine.   CODE STATUS: The patient is a full code.   TIME SPENT WITH ADMISSION: 45 minutes.    ____________________________ Belia Heman. Verdell Carmine, MD vjs:jm D: 11/26/2012 19:38:53 ET T: 11/26/2012 20:34:49 ET JOB#: [id number]  cc: Belia Heman. Verdell Carmine, MD, <Dictator> Henreitta Leber MD ELECTRONICALLY SIGNED 11/28/2012 16:44

## 2014-09-15 NOTE — Discharge Summary (Signed)
PATIENT NAME:  [name] [name], PITTMAN MR#:  [id number] DATE OF BIRTH:  March 26, 1941  DATE OF ADMISSION:  11/26/2012 DATE OF DISCHARGE:  11/27/2012  ADMITTING DIAGNOSIS: Headache with right-sided numbness and weakness.   DISCHARGE DIAGNOSES: 1.  Headache and right-sided weakness and numbness, possibly due to transient ischemic attack. MRI was negative for a stroke.  2.  Hypertension.  3.  Hyperlipidemia.  4.  Gastroesophageal reflux disease.   PERTINENT LABORATORIES AND EVALUATIONS:  MRI of the brain showed no acute infarct or any other abnormality noted. Echocardiogram showed normal EF 50 to 55% global left ventricular systolic function. Ultrasound of the carotid Dopplers showed no definite evidence of hemodynamic significant stenosis. The peak systolic velocities in the mid and distal right internal carotid artery are mildly elevated, and this was felt to be artificial related to tortuosity. CT scan of the head showed no acute intracranial processes. WBC count 8.2, hemoglobin 14.2, platelet count was 247. Troponin was less than 0.02. EKG showed normal sinus rhythm, without any ST-T wave changes.   CONSULTANTS:  None.   HOSPITAL COURSE:  The patient is a 74 year old white female who has a history of hypertension, hyperlipidemia, gastroesophageal reflux disease, anxiety. Presented with right-sided numbness and weakness. The patient was admitted for a possible transient ischemic attack. She had a CT scan of the head, which was negative. She underwent a carotid Doppler, which did not show any significant stenosis. She had an echocardiogram of the heart which showed no thrombus. The patient's symptoms had resolved by the time she arrived to the ED. The patient also underwent an MRI of the brain, which was negative. The patient currently is asymptomatic and is doing well and is stable for discharge.     DISCHARGE MEDICATIONS:  1.  Atenolol 50, one tab p.o. b.i.d. 2.  Simvastatin 20 daily.  3.  Ranitidine  300 daily.  4.  Omeprazole 20 daily.  5.  Vitamin D3 1000 International Units daily.  6.  CoQ10, 200, one tab p.o. daily.  7.  Trazodone 50, one tab p.o. at bedtime as needed for sleep.  8.  Lorazepam 0.5, one tab p.o. daily as needed for anxiety.  9.  Clobetasol topically 0.05%, apply topically to affected area 2 times a day as needed.  10.  Aspirin  162 mg daily.   DIET:  Low-sodium, low-fat, low-cholesterol.   ACTIVITY:  As tolerated.   FOLLOWUP:  Follow with primary M.D. in 1 to 2 weeks.   TIME SPENT:  Thirty-two minutes spent.    ____________________________ Lafonda Mosses. Posey Pronto, MD shp:kc D: 11/28/2012 07:50:46 ET T: 11/28/2012 09:08:58 ET JOB#: [id number]  cc: Asriel Westrup H. Posey Pronto, MD, <Dictator> Alric Seton MD ELECTRONICALLY SIGNED 12/07/2012 10:54

## 2014-09-25 ENCOUNTER — Other Ambulatory Visit: Payer: Self-pay | Admitting: Family Medicine

## 2014-09-25 DIAGNOSIS — Z1231 Encounter for screening mammogram for malignant neoplasm of breast: Secondary | ICD-10-CM

## 2014-10-14 DIAGNOSIS — G4733 Obstructive sleep apnea (adult) (pediatric): Secondary | ICD-10-CM | POA: Diagnosis not present

## 2014-10-17 ENCOUNTER — Ambulatory Visit (HOSPITAL_COMMUNITY)
Admission: RE | Admit: 2014-10-17 | Discharge: 2014-10-17 | Disposition: A | Discharge status: Home / Self Care | Payer: Commercial Managed Care - HMO | Source: Ambulatory Visit | Attending: Family Medicine | Admitting: Family Medicine

## 2014-10-17 DIAGNOSIS — Z1231 Encounter for screening mammogram for malignant neoplasm of breast: Secondary | ICD-10-CM | POA: Diagnosis not present

## 2014-10-17 LAB — HM MAMMOGRAPHY

## 2014-10-31 ENCOUNTER — Other Ambulatory Visit: Payer: Self-pay | Admitting: Family Medicine

## 2014-10-31 DIAGNOSIS — I10 Essential (primary) hypertension: Secondary | ICD-10-CM

## 2014-10-31 DIAGNOSIS — E7800 Pure hypercholesterolemia, unspecified: Secondary | ICD-10-CM

## 2014-11-06 DIAGNOSIS — L814 Other melanin hyperpigmentation: Secondary | ICD-10-CM | POA: Diagnosis not present

## 2014-11-06 DIAGNOSIS — L821 Other seborrheic keratosis: Secondary | ICD-10-CM | POA: Diagnosis not present

## 2014-11-06 DIAGNOSIS — D1800 Hemangioma unspecified site: Secondary | ICD-10-CM | POA: Diagnosis not present

## 2014-11-06 DIAGNOSIS — L820 Inflamed seborrheic keratosis: Secondary | ICD-10-CM | POA: Diagnosis not present

## 2014-11-06 DIAGNOSIS — L400 Psoriasis vulgaris: Secondary | ICD-10-CM | POA: Diagnosis not present

## 2014-11-06 DIAGNOSIS — Z1283 Encounter for screening for malignant neoplasm of skin: Secondary | ICD-10-CM | POA: Diagnosis not present

## 2014-11-06 DIAGNOSIS — D229 Melanocytic nevi, unspecified: Secondary | ICD-10-CM | POA: Diagnosis not present

## 2014-11-06 DIAGNOSIS — L578 Other skin changes due to chronic exposure to nonionizing radiation: Secondary | ICD-10-CM | POA: Diagnosis not present

## 2014-11-14 DIAGNOSIS — G4733 Obstructive sleep apnea (adult) (pediatric): Secondary | ICD-10-CM | POA: Diagnosis not present

## 2014-11-15 DIAGNOSIS — Z1211 Encounter for screening for malignant neoplasm of colon: Secondary | ICD-10-CM | POA: Diagnosis not present

## 2014-11-24 DIAGNOSIS — F329 Major depressive disorder, single episode, unspecified: Secondary | ICD-10-CM | POA: Insufficient documentation

## 2014-11-24 DIAGNOSIS — IMO0002 Reserved for concepts with insufficient information to code with codable children: Secondary | ICD-10-CM | POA: Insufficient documentation

## 2014-11-24 DIAGNOSIS — R5383 Other fatigue: Secondary | ICD-10-CM | POA: Insufficient documentation

## 2014-11-24 DIAGNOSIS — F32A Depression, unspecified: Secondary | ICD-10-CM | POA: Insufficient documentation

## 2014-11-24 DIAGNOSIS — R42 Dizziness and giddiness: Secondary | ICD-10-CM | POA: Insufficient documentation

## 2014-11-24 DIAGNOSIS — J309 Allergic rhinitis, unspecified: Secondary | ICD-10-CM | POA: Insufficient documentation

## 2014-11-29 ENCOUNTER — Ambulatory Visit (INDEPENDENT_AMBULATORY_CARE_PROVIDER_SITE_OTHER): Payer: Medicare HMO | Admitting: Family Medicine

## 2014-11-29 ENCOUNTER — Encounter: Payer: Self-pay | Admitting: Family Medicine

## 2014-11-29 VITALS — BP 130/72 | HR 68 | Temp 98.0°F | Resp 20 | Ht 60.0 in | Wt 178.0 lb

## 2014-11-29 DIAGNOSIS — I10 Essential (primary) hypertension: Secondary | ICD-10-CM

## 2014-11-29 DIAGNOSIS — G8929 Other chronic pain: Secondary | ICD-10-CM

## 2014-11-29 DIAGNOSIS — M25562 Pain in left knee: Secondary | ICD-10-CM

## 2014-11-29 NOTE — Progress Notes (Signed)
Subjective:    Patient ID: [name] [name], female    DOB: 07-12-1940, 74 y.o.   MRN: [id number]  Knee Pain  The pain is present in the left knee (x 6 months. After surgery). The pain is at a severity of 6/10. The pain is moderate. The pain has been worsening since onset. Associated symptoms include an inability to bear weight, a loss of motion and muscle weakness. Pertinent negatives include no loss of sensation, numbness or tingling. The symptoms are aggravated by weight bearing and movement. She has tried NSAIDs (Aleve) for the symptoms. The treatment provided mild relief.      Review of Systems  Constitutional: Negative.   Respiratory: Negative for cough, chest tightness, shortness of breath and wheezing.   Cardiovascular: Positive for leg swelling. Negative for chest pain and palpitations.  Neurological: Negative for tingling and numbness.   BP 130/72 mmHg  Pulse 68  Temp(Src) 98 F (36.7 C) (Oral)  Resp 20  Ht 5' (1.524 m)  Wt 178 lb (80.74 kg)  BMI 34.76 kg/m2   Patient Active Problem List   Diagnosis Date Noted  . Allergic rhinitis 11/24/2014  . Adult BMI 30+ 11/24/2014  . Clinical depression 11/24/2014  . Dizziness 11/24/2014  . Fatigue 11/24/2014  . Adenopathy 12/06/2009  . Abnormal ECG 11/08/2009  . Cannot sleep 08/23/2009  . 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 Headache, migraine 05/10/2009  . HERPES SIMPLEX INFECTION 08/14/2006  . HYPERCHOLESTEROLEMIA 08/14/2006  . ANXIETY 08/14/2006  . Essential hypertension 08/14/2006  . HIATAL HERNIA 08/14/2006  . IRRITABLE BOWEL SYNDROME 08/14/2006  . 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 OSTEOARTHRITIS 08/14/2006  . 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 Hypertension    Current Outpatient Prescriptions on File Prior to Visit  Medication Sig  . Ascorbic Acid (VITAMIN C) 1000 MG tablet Take by mouth.  [name] Kitchen aspirin EC 81 MG tablet Take 81 mg by mouth daily.  [name] Kitchen atenolol (TENORMIN) 50 MG tablet TAKE 1 TABLET  TWICE DAILY  . cholecalciferol (VITAMIN D) 1000 UNITS tablet Take 1,000 Units by mouth daily.  . clobetasol ointment (TEMOVATE) 1.85 % Apply 1 application topically daily as needed (for itching).  . hyoscyamine (LEVSIN, ANASPAZ) 0.125 MG tablet Take by mouth.  . magnesium gluconate (MAGONATE) 500 MG tablet Take by mouth.  [name] Kitchen omeprazole (PRILOSEC) 20 MG capsule Take 1 capsule (20 mg total) by mouth 2 (two) times daily before a meal.  . sertraline (ZOLOFT) 50 MG tablet Take by mouth.  . simvastatin (ZOCOR) 20 MG tablet TAKE 1 TABLET EVERY DAY AT BEDTIME (Patient taking differently: 1 tablet 3 times a week)  . traZODone (DESYREL) 50 MG tablet Take 25-50 mg by mouth at bedtime as needed for sleep.   No current facility-administered medications on file prior to visit.   Allergies  Allergen Reactions  . Hydrocodone Nausea Only   Past Surgical History  Procedure Laterality Date  . Cholecystectomy    . Knee surgery    . Breast surgery      breast byopsy   History   Social History  . Marital Status: Married    Spouse Name: N/A  . Number of Children: N/A  . Years of Education: N/A   Occupational History  . Not on file.   Social History Main Topics  . Smoking status: Never Smoker   . Smokeless tobacco: Never Used  . Alcohol Use: No  . Drug Use: No  . Sexual Activity: Not on file  Other Topics Concern  . Not on file   Social History Narrative   Family History  Problem Relation Age of Onset  . Heart attack Mother   . Dementia Father   . Aortic aneurysm Brother        Objective:   Physical Exam  Constitutional: She is oriented to person, place, and time. She appears well-developed and well-nourished.  Cardiovascular: Normal rate and regular rhythm.   Musculoskeletal: She exhibits edema (left lateral knee and behind knee. ) and tenderness.  Neurological: She is alert and oriented to person, place, and time.  Psychiatric: She has a normal mood and affect. Her behavior is  normal. Judgment and thought content normal.   BP 130/72 mmHg  Pulse 68  Temp(Src) 98 F (36.7 C) (Oral)  Resp 20  Ht 5' (1.524 m)  Wt 178 lb (80.74 kg)  BMI 34.76 kg/m2        Assessment & Plan:   1. Knee pain, chronic, left New problem to me, but has seen orthopedics in the past. Needs to be referred back.  Will refer today.  - Ambulatory referral to Orthopedic Surgery  2. Essential hypertension Condition is stable. Please continue current medication and  plan of care as noted.  Will recheck at CPE.   3.  Mood- Stable on current medication. Still visiting husband daily and he is worsening. Will follow up as needed.   Margarita Rana, MD

## 2014-12-07 DIAGNOSIS — M1712 Unilateral primary osteoarthritis, left knee: Secondary | ICD-10-CM | POA: Diagnosis not present

## 2014-12-07 DIAGNOSIS — M25562 Pain in left knee: Secondary | ICD-10-CM | POA: Diagnosis not present

## 2014-12-14 DIAGNOSIS — G4733 Obstructive sleep apnea (adult) (pediatric): Secondary | ICD-10-CM | POA: Diagnosis not present

## 2014-12-15 ENCOUNTER — Encounter: Payer: Self-pay | Admitting: *Deleted

## 2014-12-15 DIAGNOSIS — G4733 Obstructive sleep apnea (adult) (pediatric): Secondary | ICD-10-CM | POA: Diagnosis not present

## 2014-12-18 ENCOUNTER — Ambulatory Visit: Payer: Commercial Managed Care - HMO | Admitting: Anesthesiology

## 2014-12-18 ENCOUNTER — Ambulatory Visit
Admission: RE | Admit: 2014-12-18 | Discharge: 2014-12-18 | Disposition: A | Discharge status: Home / Self Care | Payer: Commercial Managed Care - HMO | Source: Ambulatory Visit | Attending: Unknown Physician Specialty | Admitting: Unknown Physician Specialty

## 2014-12-18 ENCOUNTER — Encounter
Admission: RE | Disposition: A | Discharge status: Home / Self Care | Payer: Self-pay | Source: Ambulatory Visit | Attending: Unknown Physician Specialty

## 2014-12-18 ENCOUNTER — Encounter: Payer: Self-pay | Admitting: Anesthesiology

## 2014-12-18 DIAGNOSIS — I10 Essential (primary) hypertension: Secondary | ICD-10-CM | POA: Diagnosis not present

## 2014-12-18 DIAGNOSIS — D125 Benign neoplasm of sigmoid colon: Secondary | ICD-10-CM | POA: Insufficient documentation

## 2014-12-18 DIAGNOSIS — K635 Polyp of colon: Secondary | ICD-10-CM | POA: Diagnosis not present

## 2014-12-18 DIAGNOSIS — K648 Other hemorrhoids: Secondary | ICD-10-CM | POA: Insufficient documentation

## 2014-12-18 DIAGNOSIS — Z1211 Encounter for screening for malignant neoplasm of colon: Secondary | ICD-10-CM | POA: Insufficient documentation

## 2014-12-18 DIAGNOSIS — K640 First degree hemorrhoids: Secondary | ICD-10-CM | POA: Diagnosis not present

## 2014-12-18 HISTORY — DX: Sleep apnea, unspecified: G47.30

## 2014-12-18 HISTORY — DX: Fatty (change of) liver, not elsewhere classified: K76.0

## 2014-12-18 HISTORY — DX: Hyperlipidemia, unspecified: E78.5

## 2014-12-18 HISTORY — DX: Irritable bowel syndrome, unspecified: K58.9

## 2014-12-18 HISTORY — DX: Gastro-esophageal reflux disease without esophagitis: K21.9

## 2014-12-18 HISTORY — PX: COLONOSCOPY WITH PROPOFOL: SHX5780

## 2014-12-18 HISTORY — DX: Obesity, unspecified: E66.9

## 2014-12-18 SURGERY — COLONOSCOPY WITH PROPOFOL
Anesthesia: General

## 2014-12-18 MED ORDER — LIDOCAINE HCL (PF) 1 % IJ SOLN
INTRAMUSCULAR | Status: AC
  Administered 2014-12-18: 0.3 mL via INTRADERMAL
  Filled 2014-12-18: qty 2

## 2014-12-18 MED ORDER — LIDOCAINE HCL (CARDIAC) 20 MG/ML IV SOLN
INTRAVENOUS | Status: DC | PRN
  Administered 2014-12-18: 30 mg via INTRAVENOUS

## 2014-12-18 MED ORDER — LIDOCAINE HCL (PF) 1 % IJ SOLN
2.0000 mL | Freq: Once | INTRAMUSCULAR | Status: AC
  Administered 2014-12-18: 0.3 mL via INTRADERMAL

## 2014-12-18 MED ORDER — SODIUM CHLORIDE 0.9 % IV SOLN
INTRAVENOUS | Status: DC
  Administered 2014-12-18: 1000 mL via INTRAVENOUS

## 2014-12-18 MED ORDER — FENTANYL CITRATE (PF) 100 MCG/2ML IJ SOLN
INTRAMUSCULAR | Status: DC | PRN
  Administered 2014-12-18: 50 ug via INTRAVENOUS

## 2014-12-18 MED ORDER — PROPOFOL INFUSION 10 MG/ML OPTIME
INTRAVENOUS | Status: DC | PRN
  Administered 2014-12-18: 120 ug/kg/min via INTRAVENOUS

## 2014-12-18 MED ORDER — MIDAZOLAM HCL 2 MG/2ML IJ SOLN
INTRAMUSCULAR | Status: DC | PRN
  Administered 2014-12-18: 1 mg via INTRAVENOUS

## 2014-12-18 NOTE — Transfer of Care (Signed)
Immediate Anesthesia Transfer of Care Note  Patient: [name] [name]  Procedure(s) Performed: Procedure(s): COLONOSCOPY WITH PROPOFOL (N/A)  Patient Location: PACU  Anesthesia Type:General  Level of Consciousness: awake, alert  and oriented  Airway & Oxygen Therapy: Patient Spontanous Breathing and Patient connected to nasal cannula oxygen  Post-op Assessment: Report given to RN and Post -op Vital signs reviewed and stable  Post vital signs: Reviewed and stable  Last Vitals:  Filed Vitals:   12/18/14 1330  BP: 134/92  Pulse: 85  Temp: 37 C  Resp: 16    Complications: No apparent anesthesia complications

## 2014-12-18 NOTE — Anesthesia Procedure Notes (Signed)
Performed by: COOK-MARTIN, Ashlie Mcmenamy Pre-anesthesia Checklist: Patient identified, Emergency Drugs available, Suction available, Patient being monitored and Timeout performed Patient Re-evaluated:Patient Re-evaluated prior to inductionOxygen Delivery Method: Nasal cannula Preoxygenation: Pre-oxygenation with 100% oxygen Intubation Type: IV induction Ventilation: Oral airway inserted - appropriate to patient size Placement Confirmation: positive ETCO2 and CO2 detector     

## 2014-12-18 NOTE — Op Note (Signed)
[facility] Patient Name: [name] [name] Procedure Date: 12/18/2014 1:52 PM MRN: [id number] Account #: [id number] Date of Birth: Oct 02, 1940 Admit Type: Outpatient Age: 74 Room: [facility] ENDO ROOM 1 Gender: Female Note Status: Finalized Procedure:         Colonoscopy Indications:       Screening for colorectal malignant neoplasm Providers:         Manya Silvas, MD Referring MD:      Jerrell Belfast, MD (Referring MD) Medicines:         Propofol per Anesthesia Complications:     No immediate complications. Procedure:         Pre-Anesthesia Assessment:                    - After reviewing the risks and benefits, the patient was                     deemed in satisfactory condition to undergo the procedure.                    After obtaining informed consent, the colonoscope was                     passed under direct vision. Throughout the procedure, the                     patient's blood pressure, pulse, and oxygen saturations                     were monitored continuously. The Colonoscope was                     introduced through the anus and advanced to the the cecum,                     identified by appendiceal orifice and ileocecal valve. The                     colonoscopy was performed without difficulty. The patient                     tolerated the procedure well. The quality of the bowel                     preparation was good. Findings:      A diminutive polyp was found in the sigmoid colon. The polyp was       sessile. The polyp was removed with a jumbo cold forceps. Resection and       retrieval were complete.      Internal hemorrhoids were found during endoscopy. The hemorrhoids were       small and Grade I (internal hemorrhoids that do not prolapse).      The exam was otherwise without abnormality. Impression:        - One diminutive polyp in the sigmoid colon. Resected and                     retrieved.                    -  Internal hemorrhoids.                    - The examination was otherwise normal. Recommendation:    - Await pathology results. Herbie Baltimore  Federico Flake, MD 12/18/2014 2:32:32 PM This report has been signed electronically. Number of Addenda: 0 Note Initiated On: 12/18/2014 1:52 PM Scope Withdrawal Time: 0 hours 10 minutes 16 seconds  Total Procedure Duration: 0 hours 19 minutes 22 seconds       [facility]

## 2014-12-18 NOTE — Anesthesia Postprocedure Evaluation (Signed)
  Anesthesia Post-op Note  Patient: [name] [name]  Procedure(s) Performed: Procedure(s): COLONOSCOPY WITH PROPOFOL (N/A)  Anesthesia type:General  Patient location: PACU  Post pain: Pain level controlled  Post assessment: Post-op Vital signs reviewed, Patient's Cardiovascular Status Stable, Respiratory Function Stable, Patent Airway and No signs of Nausea or vomiting  Post vital signs: Reviewed and stable  Last Vitals:  Filed Vitals:   12/18/14 1330  BP: 134/92  Pulse: 85  Temp: 37 C  Resp: 16    Level of consciousness: awake, alert  and patient cooperative  Complications: No apparent anesthesia complications

## 2014-12-18 NOTE — Anesthesia Preprocedure Evaluation (Addendum)
Anesthesia Evaluation  Patient identified by MRN, date of birth, ID band Patient awake    Reviewed: Allergy & Precautions, NPO status , Patient's Chart, lab work & pertinent test results  History of Anesthesia Complications Negative for: history of anesthetic complications  Airway Mallampati: II  TM Distance: >3 FB Neck ROM: Full    Dental  (+) Teeth Intact   Pulmonary sleep apnea and Continuous Positive Airway Pressure Ventilation ,          Cardiovascular hypertension, Pt. on medications     Neuro/Psych Anxiety Depression    GI/Hepatic GERD-  Medicated and Poorly Controlled,  Endo/Other    Renal/GU      Musculoskeletal  (+) Arthritis -, Osteoarthritis,    Abdominal   Peds  Hematology   Anesthesia Other Findings   Reproductive/Obstetrics                            Anesthesia Physical Anesthesia Plan  ASA: III  Anesthesia Plan: General   Post-op Pain Management:    Induction: Intravenous  Airway Management Planned: Nasal Cannula  Additional Equipment:   Intra-op Plan:   Post-operative Plan:   Informed Consent: I have reviewed the patients History and Physical, chart, labs and discussed the procedure including the risks, benefits and alternatives for the proposed anesthesia with the patient or authorized representative who has indicated his/her understanding and acceptance.     Plan Discussed with:   Anesthesia Plan Comments:         Anesthesia Quick Evaluation

## 2014-12-19 ENCOUNTER — Encounter: Payer: Self-pay | Admitting: Unknown Physician Specialty

## 2014-12-20 LAB — SURGICAL PATHOLOGY

## 2015-01-14 DIAGNOSIS — G4733 Obstructive sleep apnea (adult) (pediatric): Secondary | ICD-10-CM | POA: Diagnosis not present

## 2015-02-14 DIAGNOSIS — G4733 Obstructive sleep apnea (adult) (pediatric): Secondary | ICD-10-CM | POA: Diagnosis not present

## 2015-02-16 ENCOUNTER — Other Ambulatory Visit: Payer: Self-pay | Admitting: Family Medicine

## 2015-02-16 DIAGNOSIS — F32A Depression, unspecified: Secondary | ICD-10-CM

## 2015-02-16 DIAGNOSIS — F329 Major depressive disorder, single episode, unspecified: Secondary | ICD-10-CM

## 2015-03-16 DIAGNOSIS — G4733 Obstructive sleep apnea (adult) (pediatric): Secondary | ICD-10-CM | POA: Diagnosis not present

## 2015-04-13 DIAGNOSIS — H04123 Dry eye syndrome of bilateral lacrimal glands: Secondary | ICD-10-CM | POA: Diagnosis not present

## 2015-04-16 DIAGNOSIS — G4733 Obstructive sleep apnea (adult) (pediatric): Secondary | ICD-10-CM | POA: Diagnosis not present

## 2015-04-17 DIAGNOSIS — H1033 Unspecified acute conjunctivitis, bilateral: Secondary | ICD-10-CM | POA: Diagnosis not present

## 2015-05-02 DIAGNOSIS — H2513 Age-related nuclear cataract, bilateral: Secondary | ICD-10-CM | POA: Diagnosis not present

## 2015-05-03 DIAGNOSIS — G8929 Other chronic pain: Secondary | ICD-10-CM | POA: Diagnosis not present

## 2015-05-03 DIAGNOSIS — M1712 Unilateral primary osteoarthritis, left knee: Secondary | ICD-10-CM | POA: Diagnosis not present

## 2015-05-03 DIAGNOSIS — M25562 Pain in left knee: Secondary | ICD-10-CM | POA: Diagnosis not present

## 2015-05-07 ENCOUNTER — Encounter: Payer: Self-pay | Admitting: Family Medicine

## 2015-05-07 ENCOUNTER — Ambulatory Visit (INDEPENDENT_AMBULATORY_CARE_PROVIDER_SITE_OTHER): Payer: Commercial Managed Care - HMO | Admitting: Family Medicine

## 2015-05-07 VITALS — BP 124/82 | HR 84 | Temp 98.6°F | Resp 16 | Wt 183.0 lb

## 2015-05-07 DIAGNOSIS — I10 Essential (primary) hypertension: Secondary | ICD-10-CM | POA: Diagnosis not present

## 2015-05-07 DIAGNOSIS — Z0000 Encounter for general adult medical examination without abnormal findings: Secondary | ICD-10-CM | POA: Diagnosis not present

## 2015-05-07 DIAGNOSIS — R7309 Other abnormal glucose: Secondary | ICD-10-CM | POA: Diagnosis not present

## 2015-05-07 DIAGNOSIS — E7800 Pure hypercholesterolemia, unspecified: Secondary | ICD-10-CM

## 2015-05-07 DIAGNOSIS — Z13820 Encounter for screening for osteoporosis: Secondary | ICD-10-CM

## 2015-05-07 NOTE — Progress Notes (Signed)
Patient ID: [name], female   DOB: Oct 30, 1940, 74 y.o.   MRN: [id number]         Patient: [name] [name], Female    DOB: 22-Mar-1941, 74 y.o.   MRN: [id number] Visit Date: 05/07/2015  Today's Provider: Margarita Rana, MD   Chief Complaint  Patient presents with  . Annual Exam   Subjective:    Annual wellness visit [name] [name] is a 74 y.o. female. She feels fairly well.  Recently saw Dr. Marry [name] and she was told she will need a knee replacement.  She reports not exercising much secondary to knee pain.  She reports she is sleeping well.  -----------------------------------------------------------   Review of Systems  Constitutional: Positive for activity change. Negative for fever, chills, diaphoresis, appetite change, fatigue and unexpected weight change.  HENT: Negative.   Eyes: Negative.   Respiratory: Positive for apnea. Negative for cough, choking, chest tightness, shortness of breath, wheezing and stridor.   Cardiovascular: Negative.   Gastrointestinal: Negative.   Endocrine: Negative.   Genitourinary: Negative.   Musculoskeletal: Positive for joint swelling, arthralgias and gait problem. Negative for myalgias, back pain, neck pain and neck stiffness.  Skin: Negative.   Allergic/Immunologic: Negative.   Neurological: Negative.   Hematological: Negative.   Psychiatric/Behavioral: Negative.     Social History   Social History  . Marital Status: Married    Spouse Name: N/A  . Number of Children: N/A  . Years of Education: N/A   Occupational History  . Not on file.   Social History Main Topics  . Smoking status: Never Smoker   . Smokeless tobacco: Never Used  . Alcohol Use: No  . Drug Use: No  . Sexual Activity: Not on file   Other Topics Concern  . Not on file   Social History Narrative    Patient Active Problem List   Diagnosis Date Noted  . Allergic rhinitis 11/24/2014  . Adult BMI 30+ 11/24/2014  . Clinical depression 11/24/2014  .  Dizziness 11/24/2014  . Fatigue 11/24/2014  . Adenopathy 12/06/2009  . Abnormal ECG 11/08/2009  . Cannot sleep 08/23/2009  . 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 Headache, migraine 05/10/2009  . HERPES SIMPLEX INFECTION 08/14/2006  . HYPERCHOLESTEROLEMIA 08/14/2006  . ANXIETY 08/14/2006  . Essential hypertension 08/14/2006  . HIATAL HERNIA 08/14/2006  . IRRITABLE BOWEL SYNDROME 08/14/2006  . 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 OSTEOARTHRITIS 08/14/2006  . PLANTAR FASCIITIS 08/14/2006    Past Surgical History  Procedure Laterality Date  . Cholecystectomy    . Knee surgery    . Breast surgery      breast byopsy  . Bladder surgery    . Deep neck lymph node biopsy / excision    . Colonoscopy with propofol N/A 12/18/2014    Procedure: COLONOSCOPY WITH PROPOFOL;  Surgeon: Manya Silvas, MD;  Location: [facility] ENDOSCOPY;  Service: Endoscopy;  Laterality: N/A;    Her family history includes Aortic aneurysm in her brother; Dementia in her father; Heart attack in her mother.    Previous Medications   ASCORBIC ACID (VITAMIN C) 1000 MG TABLET    Take by mouth.   ASPIRIN EC 81 MG TABLET    Take 81 mg by mouth daily.   ATENOLOL (TENORMIN) 50 MG TABLET    TAKE 1 TABLET TWICE DAILY   BIOTIN (BIOTIN MAXIMUM STRENGTH) 10 MG TABS    Take by mouth.   CHOLECALCIFEROL (VITAMIN D) 1000 UNITS TABLET    Take 1,000 Units by mouth daily.  CLOBETASOL OINTMENT (TEMOVATE) 0.05 %    Apply 1 application topically daily as needed (for itching).   DICLOFENAC SODIUM (VOLTAREN) 1 % GEL    Apply topically.   GLUCOSAMINE SULFATE (SM GLUCOSAMINE SULFATE) 1000 MG TABS    Take by mouth.   HYOSCYAMINE (LEVSIN, ANASPAZ) 0.125 MG TABLET    Take by mouth.   MAGNESIUM (M2 MAGNESIUM) 100 MG CAPS    Take by mouth.   MAGNESIUM GLUCONATE (MAGONATE) 500 MG TABLET    Take by mouth.   MULTIVITAMIN-LUTEIN (OCUVITE-LUTEIN) CAPS CAPSULE    Take 1 capsule by mouth daily.   OMEGA-3 FISH OIL (MAXEPA) 1000 MG CAPS CAPSULE    Take by mouth.    OMEPRAZOLE (PRILOSEC) 20 MG CAPSULE    Take 1 capsule (20 mg total) by mouth 2 (two) times daily before a meal.   PROBIOTIC PRODUCT (ALIGN PO)    Take by mouth.   RANITIDINE (ZANTAC) 300 MG TABLET    Take 300 mg by mouth at bedtime.   SERTRALINE (ZOLOFT) 50 MG TABLET    TAKE 1 TABLET EVERY DAY   SIMVASTATIN (ZOCOR) 20 MG TABLET    TAKE 1 TABLET EVERY DAY AT BEDTIME   TRAZODONE (DESYREL) 50 MG TABLET    Take 25-50 mg by mouth at bedtime as needed for sleep.    Patient Care Team: [name] Rana, MD as PCP - General (Family Medicine)     Objective:   Vitals: BP 124/82 mmHg  Pulse 84  Temp(Src) 98.6 F (37 C) (Oral)  Resp 16  Wt 183 lb (83.008 kg)  Physical Exam  Constitutional: She is oriented to person, place, and time. She appears well-developed and well-nourished.  HENT:  Head: Normocephalic and atraumatic.  Right Ear: External ear normal.  Left Ear: External ear normal.  Eyes: Conjunctivae and EOM are normal. Pupils are equal, round, and reactive to light.  Neck: Normal range of motion. Neck supple. Carotid bruit is not present.  Cardiovascular: Normal rate, regular rhythm and normal heart sounds.   Pulmonary/Chest: Effort normal.  Abdominal: Soft. Bowel sounds are normal. There is no tenderness.  Musculoskeletal: She exhibits no edema or tenderness.  Neurological: She is alert and oriented to person, place, and time.  Skin: Skin is warm and dry.  Psychiatric: She has a normal mood and affect. Her behavior is normal. Judgment and thought content normal.  Vitals reviewed.   Activities of Daily Living In your present state of health, do you have any difficulty performing the following activities: 05/07/2015  Hearing? N  Vision? N  Difficulty concentrating or making decisions? N  Walking or climbing stairs? N  Dressing or bathing? N  Doing errands, shopping? N    Fall Risk Assessment Fall Risk  05/07/2015 11/29/2014  Falls in the past year? No No     Depression  Screen PHQ 2/9 Scores 05/07/2015 11/29/2014  PHQ - 2 Score 0 -  Exception Documentation - Medical reason    Cognitive Testing - 6-CIT  Correct? Score   What year is it? yes 0 0 or 4  What month is it? yes 0 0 or 3  Memorize:    Pia Mau,  [birth year],  [place],      What time is it? (within 1 hour) yes 0 0 or 3  Count backwards from 20 yes 0 0, 2, or 4  Name the months of the year yes 0 0, 2, or 4  Repeat name & address above yes  0 0, 2, 4, 6, 8, or 10       TOTAL SCORE  0/28   Interpretation:  Normal  Normal (0-7) Abnormal (8-28)       Assessment & Plan:     Annual Wellness Visit  Reviewed patient's Family Medical History Reviewed and updated list of patient's medical providers Assessment of cognitive impairment was done Assessed patient's functional ability Established a written schedule for health screening [place] Completed and Reviewed  Exercise Activities and Dietary recommendations Goals    None      Immunization History  Administered Date(s) Administered  . Influenza Whole 05/09/2004  . Influenza-Unspecified 01/24/2014  . Td 10/24/2005    Health Maintenance  Topic Date Due  . FOOT EXAM  09/16/1952  . OPHTHALMOLOGY EXAM  09/16/1952  . URINE MICROALBUMIN  09/16/1952  . ZOSTAVAX  09/17/2002  . PNA vac Low Risk Adult (1 of 2 - PCV13) 09/17/2007  . HEMOGLOBIN A1C  09/28/2014  . TETANUS/TDAP  10/25/2015  . INFLUENZA VACCINE  12/25/2015  . MAMMOGRAM  10/16/2016  . COLONOSCOPY  12/17/2024  . DEXA SCAN  Completed      Discussed health benefits of physical activity, and encouraged her to engage in regular exercise appropriate for her age and condition.   1. Medicare annual wellness visit, subsequent As above.   2. Essential hypertension Check labs.  - CBC with Differential/Platelet - TSH  3. HYPERCHOLESTEROLEMIA Check labs.  - Lipid panel  4. Abnormal glucose Will check labs.  - Comprehensive metabolic panel -  Hemoglobin A1c  5. Osteoporosis screening Will schedule.  - DG Bone Density   Patient was seen and examined by Jerrell Belfast, MD, and note scribed by Ashley Royalty, CMA.  I have reviewed the document for accuracy and completeness and I agree with above. Jerrell Belfast, MD   [name] Rana, MD    ------------------------------------------------------------------------------------------------------------

## 2015-05-09 DIAGNOSIS — Z0100 Encounter for examination of eyes and vision without abnormal findings: Secondary | ICD-10-CM | POA: Diagnosis not present

## 2015-05-16 DIAGNOSIS — G4733 Obstructive sleep apnea (adult) (pediatric): Secondary | ICD-10-CM | POA: Diagnosis not present

## 2015-05-29 ENCOUNTER — Telehealth: Payer: Self-pay

## 2015-05-29 ENCOUNTER — Ambulatory Visit
Admission: RE | Admit: 2015-05-29 | Discharge: 2015-05-29 | Disposition: A | Discharge status: Home / Self Care | Payer: PPO | Source: Ambulatory Visit | Attending: Family Medicine | Admitting: Family Medicine

## 2015-05-29 DIAGNOSIS — Z780 Asymptomatic menopausal state: Secondary | ICD-10-CM | POA: Insufficient documentation

## 2015-05-29 DIAGNOSIS — Z13820 Encounter for screening for osteoporosis: Secondary | ICD-10-CM | POA: Insufficient documentation

## 2015-05-29 NOTE — Telephone Encounter (Signed)
-----   Message from Margarita Rana, MD sent at 05/29/2015  3:10 PM EST ----- Normal bone density. Recheck in 3 years. Thanks.

## 2015-05-29 NOTE — Telephone Encounter (Signed)
Informed pt as below. [name] [name], CMA  

## 2015-05-31 DIAGNOSIS — I10 Essential (primary) hypertension: Secondary | ICD-10-CM | POA: Diagnosis not present

## 2015-05-31 DIAGNOSIS — E7800 Pure hypercholesterolemia, unspecified: Secondary | ICD-10-CM | POA: Diagnosis not present

## 2015-05-31 DIAGNOSIS — R7309 Other abnormal glucose: Secondary | ICD-10-CM | POA: Diagnosis not present

## 2015-06-01 ENCOUNTER — Telehealth: Payer: Self-pay

## 2015-06-01 LAB — CBC WITH DIFFERENTIAL/PLATELET
Basophils Absolute: 0.1 10*3/uL (ref 0.0–0.2)
Basos: 1 %
EOS (ABSOLUTE): 0.2 10*3/uL (ref 0.0–0.4)
Eos: 4 %
Hematocrit: 45 % (ref 34.0–46.6)
Hemoglobin: 15.7 g/dL (ref 11.1–15.9)
Immature Grans (Abs): 0 10*3/uL (ref 0.0–0.1)
Immature Granulocytes: 1 %
Lymphocytes Absolute: 2 10*3/uL (ref 0.7–3.1)
Lymphs: 29 %
MCH: 31.1 pg (ref 26.6–33.0)
MCHC: 34.9 g/dL (ref 31.5–35.7)
MCV: 89 fL (ref 79–97)
Monocytes Absolute: 0.6 10*3/uL (ref 0.1–0.9)
Monocytes: 9 %
Neutrophils Absolute: 4 10*3/uL (ref 1.4–7.0)
Neutrophils: 56 %
Platelets: 280 10*3/uL (ref 150–379)
RBC: 5.05 x10E6/uL (ref 3.77–5.28)
RDW: 12.6 % (ref 12.3–15.4)
WBC: 6.9 10*3/uL (ref 3.4–10.8)

## 2015-06-01 LAB — COMPREHENSIVE METABOLIC PANEL
ALT: 18 IU/L (ref 0–32)
AST: 22 IU/L (ref 0–40)
Albumin/Globulin Ratio: 1.8 (ref 1.1–2.5)
Albumin: 4.2 g/dL (ref 3.5–4.8)
Alkaline Phosphatase: 85 IU/L (ref 39–117)
BUN/Creatinine Ratio: 16 (ref 11–26)
BUN: 15 mg/dL (ref 8–27)
Bilirubin Total: 0.4 mg/dL (ref 0.0–1.2)
CO2: 20 mmol/L (ref 18–29)
Calcium: 10 mg/dL (ref 8.7–10.3)
Chloride: 103 mmol/L (ref 96–106)
Creatinine, Ser: 0.91 mg/dL (ref 0.57–1.00)
GFR calc Af Amer: 73 mL/min/{1.73_m2} (ref >59)
GFR calc non Af Amer: 63 mL/min/{1.73_m2} (ref >59)
Globulin, Total: 2.3 g/dL (ref 1.5–4.5)
Glucose: 94 mg/dL (ref 65–99)
Potassium: 4.6 mmol/L (ref 3.5–5.2)
Sodium: 141 mmol/L (ref 134–144)
Total Protein: 6.5 g/dL (ref 6.0–8.5)

## 2015-06-01 LAB — TSH: TSH: 1.48 u[IU]/mL (ref 0.450–4.500)

## 2015-06-01 LAB — LIPID PANEL
Chol/HDL Ratio: 5 ratio units — ABNORMAL HIGH (ref 0.0–4.4)
Cholesterol, Total: 189 mg/dL (ref 100–199)
HDL: 38 mg/dL — ABNORMAL LOW (ref >39)
LDL Calculated: 108 mg/dL — ABNORMAL HIGH (ref 0–99)
Triglycerides: 216 mg/dL — ABNORMAL HIGH (ref 0–149)
VLDL Cholesterol Cal: 43 mg/dL — ABNORMAL HIGH (ref 5–40)

## 2015-06-01 LAB — HEMOGLOBIN A1C
Est. average glucose Bld gHb Est-mCnc: 117 mg/dL
Hgb A1c MFr Bld: 5.7 % — ABNORMAL HIGH (ref 4.8–5.6)

## 2015-06-01 NOTE — Telephone Encounter (Signed)
Advised pt of lab results. Pt verbally acknowledges understanding. [name] Drozdowski, CMA   

## 2015-06-01 NOTE — Telephone Encounter (Signed)
-----   Message from Margarita Rana, MD sent at 06/01/2015  9:28 AM EST ----- Labs stable. Please continue current medication. Thanks.

## 2015-06-06 ENCOUNTER — Encounter
Admission: RE | Admit: 2015-06-06 | Discharge: 2015-06-06 | Disposition: A | Discharge status: Home / Self Care | Payer: PPO | Source: Ambulatory Visit | Attending: Orthopedic Surgery | Admitting: Orthopedic Surgery

## 2015-06-06 DIAGNOSIS — Z01810 Encounter for preprocedural cardiovascular examination: Secondary | ICD-10-CM | POA: Diagnosis not present

## 2015-06-06 LAB — PROTIME-INR
INR: 0.97
Prothrombin Time: 13.1 seconds (ref 11.4–15.0)

## 2015-06-06 LAB — SURGICAL PCR SCREEN
MRSA, PCR: NEGATIVE
Staphylococcus aureus: NEGATIVE

## 2015-06-06 LAB — URINALYSIS COMPLETE WITH MICROSCOPIC (ARMC ONLY)
Bacteria, UA: NONE SEEN
Bilirubin Urine: NEGATIVE
Glucose, UA: NEGATIVE mg/dL
Hgb urine dipstick: NEGATIVE
Ketones, ur: NEGATIVE mg/dL
Nitrite: NEGATIVE
Protein, ur: NEGATIVE mg/dL
Specific Gravity, Urine: 1.019 (ref 1.005–1.030)
pH: 6 (ref 5.0–8.0)

## 2015-06-06 LAB — SEDIMENTATION RATE: Sed Rate: 8 mm/hr (ref 0–30)

## 2015-06-06 LAB — TYPE AND SCREEN
ABO/RH(D): A POS
Antibody Screen: NEGATIVE

## 2015-06-06 LAB — APTT: aPTT: 29 seconds (ref 24–36)

## 2015-06-06 LAB — ABO/RH: ABO/RH(D): A POS

## 2015-06-06 NOTE — Patient Instructions (Signed)
  Your procedure is scheduled on: Wednesday Jan. 25, 2017. Report to Same Day Surgery. To find out your arrival time please call [phone] between 1PM - 3PM on Tuesday Jan. 24, 2017.  Remember: Instructions that are not followed completely may result in serious medical risk, up to and including death, or upon the discretion of your surgeon and anesthesiologist your surgery may need to be rescheduled.    _x___ 1. Do not eat food or drink liquids after midnight. No gum chewing or hard candies.     _x___ 2. No Alcohol for 24 hours before or after surgery.   ____ 3. Bring all medications with you on the day of surgery if instructed.    __x__ 4. Notify your doctor if there is any change in your medical condition     (cold, fever, infections).     Do not wear jewelry, make-up, hairpins, clips or nail polish.  Do not wear lotions, powders, or perfumes. You may wear deodorant.  Do not shave 48 hours prior to surgery. Men may shave face and neck.  Do not bring valuables to the hospital.    [facility] is not responsible for any belongings or valuables.               Contacts, dentures or bridgework may not be worn into surgery.  Leave your suitcase in the car. After surgery it may be brought to your room.  For patients admitted to the hospital, discharge time is determined by your treatment team.   Patients discharged the day of surgery will not be allowed to drive home.    Please read over the following fact sheets that you were given:   [facility] Preparing for Surgery  _x___ Take these medicines the morning of surgery with A SIP OF WATER:    1. atenolol (TENORMIN)  2. Magnesium (M2 MAGNESIUM)   3. ranitidine (ZANTAC)  4. sertraline (ZOLOFT)    ____ Fleet Enema (as directed)   _x___ Use CHG Soap as directed  ____ Use inhalers on the day of surgery  ____ Stop metformin 2 days prior to surgery    ____ Take 1/2 of usual insulin dose the night before surgery and none on the  morning of surgery.   _x__ Stop aspirin on June 13, 2015 per Dr. Marry Guan.  _x___ Stop Anti-inflammatories on June 13, 2015.  OK to take Tylenol for pain.   __x__ Stop supplements such as  Biotin, glucosamine, vitamin D, until after surgery.    _x___ Bring C-Pap to the hospital.

## 2015-06-07 LAB — URINE CULTURE
Culture: 3000
Special Requests: NORMAL

## 2015-06-16 DIAGNOSIS — G4733 Obstructive sleep apnea (adult) (pediatric): Secondary | ICD-10-CM | POA: Diagnosis not present

## 2015-06-18 DIAGNOSIS — G4733 Obstructive sleep apnea (adult) (pediatric): Secondary | ICD-10-CM | POA: Diagnosis not present

## 2015-06-20 ENCOUNTER — Encounter
Admission: RE | Disposition: A | Discharge status: Skilled Nursing Facility | Payer: Self-pay | Source: Ambulatory Visit | Attending: Orthopedic Surgery

## 2015-06-20 ENCOUNTER — Inpatient Hospital Stay: Payer: PPO | Admitting: Certified Registered Nurse Anesthetist

## 2015-06-20 ENCOUNTER — Inpatient Hospital Stay
Admission: RE | Admit: 2015-06-20 | Discharge: 2015-06-22 | DRG: 470 | Disposition: A | Discharge status: Skilled Nursing Facility | Payer: PPO | Source: Ambulatory Visit | Attending: Orthopedic Surgery | Admitting: Orthopedic Surgery

## 2015-06-20 ENCOUNTER — Encounter: Payer: Self-pay | Admitting: Orthopedic Surgery

## 2015-06-20 ENCOUNTER — Inpatient Hospital Stay: Payer: PPO

## 2015-06-20 DIAGNOSIS — K219 Gastro-esophageal reflux disease without esophagitis: Secondary | ICD-10-CM | POA: Diagnosis present

## 2015-06-20 DIAGNOSIS — Z6835 Body mass index (BMI) 35.0-35.9, adult: Secondary | ICD-10-CM

## 2015-06-20 DIAGNOSIS — E785 Hyperlipidemia, unspecified: Secondary | ICD-10-CM | POA: Diagnosis present

## 2015-06-20 DIAGNOSIS — Z7982 Long term (current) use of aspirin: Secondary | ICD-10-CM

## 2015-06-20 DIAGNOSIS — I10 Essential (primary) hypertension: Secondary | ICD-10-CM | POA: Diagnosis not present

## 2015-06-20 DIAGNOSIS — G4733 Obstructive sleep apnea (adult) (pediatric): Secondary | ICD-10-CM | POA: Diagnosis present

## 2015-06-20 DIAGNOSIS — K760 Fatty (change of) liver, not elsewhere classified: Secondary | ICD-10-CM | POA: Diagnosis not present

## 2015-06-20 DIAGNOSIS — E669 Obesity, unspecified: Secondary | ICD-10-CM | POA: Diagnosis present

## 2015-06-20 DIAGNOSIS — Z96652 Presence of left artificial knee joint: Secondary | ICD-10-CM | POA: Diagnosis not present

## 2015-06-20 DIAGNOSIS — M1712 Unilateral primary osteoarthritis, left knee: Secondary | ICD-10-CM | POA: Diagnosis not present

## 2015-06-20 DIAGNOSIS — K589 Irritable bowel syndrome without diarrhea: Secondary | ICD-10-CM | POA: Diagnosis present

## 2015-06-20 DIAGNOSIS — Z471 Aftercare following joint replacement surgery: Secondary | ICD-10-CM | POA: Diagnosis not present

## 2015-06-20 DIAGNOSIS — Z79899 Other long term (current) drug therapy: Secondary | ICD-10-CM | POA: Diagnosis not present

## 2015-06-20 DIAGNOSIS — Z6836 Body mass index (BMI) 36.0-36.9, adult: Secondary | ICD-10-CM | POA: Diagnosis not present

## 2015-06-20 DIAGNOSIS — Z96659 Presence of unspecified artificial knee joint: Secondary | ICD-10-CM

## 2015-06-20 HISTORY — PX: KNEE ARTHROPLASTY: SHX992

## 2015-06-20 SURGERY — ARTHROPLASTY, KNEE, TOTAL, USING IMAGELESS COMPUTER-ASSISTED NAVIGATION
Anesthesia: Spinal | Site: Knee | Laterality: Left | Wound class: Clean

## 2015-06-20 MED ORDER — BUPIVACAINE LIPOSOME 1.3 % IJ SUSP
INTRAMUSCULAR | Status: AC
  Filled 2015-06-20: qty 20

## 2015-06-20 MED ORDER — PROMETHAZINE HCL 25 MG/ML IJ SOLN: 25.0000 mg | Freq: Four times a day (QID) | INTRAMUSCULAR | Status: DC | PRN

## 2015-06-20 MED ORDER — DIPHENHYDRAMINE HCL 12.5 MG/5ML PO ELIX: 12.5000 mg | ORAL_SOLUTION | ORAL | Status: DC | PRN

## 2015-06-20 MED ORDER — TRANEXAMIC ACID 1000 MG/10ML IV SOLN
1000.0000 mg | Freq: Once | INTRAVENOUS | Status: AC
  Administered 2015-06-20: 1000 mg via INTRAVENOUS
  Filled 2015-06-20: qty 10

## 2015-06-20 MED ORDER — ONDANSETRON HCL 4 MG/2ML IJ SOLN
4.0000 mg | Freq: Four times a day (QID) | INTRAMUSCULAR | Status: DC | PRN
  Administered 2015-06-20: 4 mg via INTRAVENOUS
  Filled 2015-06-20: qty 2

## 2015-06-20 MED ORDER — MAGNESIUM OXIDE 400 (241.3 MG) MG PO TABS
400.0000 mg | ORAL_TABLET | ORAL | Status: DC
  Administered 2015-06-21 – 2015-06-22 (×3): 400 mg via ORAL
  Filled 2015-06-20 (×3): qty 1

## 2015-06-20 MED ORDER — ONDANSETRON HCL 4 MG/2ML IJ SOLN
INTRAMUSCULAR | Status: DC | PRN
  Administered 2015-06-20: 4 mg via INTRAVENOUS

## 2015-06-20 MED ORDER — FERROUS SULFATE 325 (65 FE) MG PO TABS
325.0000 mg | ORAL_TABLET | Freq: Two times a day (BID) | ORAL | Status: DC
  Administered 2015-06-21 – 2015-06-22 (×2): 325 mg via ORAL
  Filled 2015-06-20 (×2): qty 1

## 2015-06-20 MED ORDER — SODIUM CHLORIDE 0.9 % IV SOLN
INTRAVENOUS | Status: DC | PRN
  Administered 2015-06-20: 60 mL

## 2015-06-20 MED ORDER — SENNOSIDES-DOCUSATE SODIUM 8.6-50 MG PO TABS
1.0000 | ORAL_TABLET | Freq: Two times a day (BID) | ORAL | Status: DC
  Administered 2015-06-20 – 2015-06-22 (×4): 1 via ORAL
  Filled 2015-06-20 (×4): qty 1

## 2015-06-20 MED ORDER — LIDOCAINE HCL (PF) 1 % IJ SOLN
INTRAMUSCULAR | Status: AC
  Filled 2015-06-20: qty 2

## 2015-06-20 MED ORDER — MIDAZOLAM HCL 5 MG/5ML IJ SOLN
INTRAMUSCULAR | Status: DC | PRN
  Administered 2015-06-20 (×2): 1 mg via INTRAVENOUS

## 2015-06-20 MED ORDER — CEFAZOLIN SODIUM-DEXTROSE 2-3 GM-% IV SOLR
2.0000 g | Freq: Four times a day (QID) | INTRAVENOUS | Status: AC
  Administered 2015-06-20 – 2015-06-21 (×4): 2 g via INTRAVENOUS
  Filled 2015-06-20 (×4): qty 50

## 2015-06-20 MED ORDER — ONDANSETRON HCL 4 MG/2ML IJ SOLN: 4.0000 mg | Freq: Once | INTRAMUSCULAR | Status: DC | PRN

## 2015-06-20 MED ORDER — LIDOCAINE HCL (CARDIAC) 20 MG/ML IV SOLN
INTRAVENOUS | Status: DC | PRN
  Administered 2015-06-20: 60 mg via INTRAVENOUS

## 2015-06-20 MED ORDER — SODIUM CHLORIDE 0.9 % IV SOLN
INTRAVENOUS | Status: DC
  Administered 2015-06-20: 18:00:00 via INTRAVENOUS

## 2015-06-20 MED ORDER — PROPOFOL 500 MG/50ML IV EMUL
INTRAVENOUS | Status: DC | PRN
  Administered 2015-06-20: 75 ug/kg/min via INTRAVENOUS

## 2015-06-20 MED ORDER — BISACODYL 10 MG RE SUPP: 10.0000 mg | Freq: Every day | RECTAL | Status: DC | PRN

## 2015-06-20 MED ORDER — MENTHOL 3 MG MT LOZG: 1.0000 | LOZENGE | OROMUCOSAL | Status: DC | PRN

## 2015-06-20 MED ORDER — VITAMIN D 1000 UNITS PO TABS
1000.0000 [IU] | ORAL_TABLET | Freq: Every day | ORAL | Status: DC
  Administered 2015-06-21 – 2015-06-22 (×2): 1000 [IU] via ORAL
  Filled 2015-06-20 (×2): qty 1

## 2015-06-20 MED ORDER — MORPHINE SULFATE (PF) 2 MG/ML IV SOLN: 2.0000 mg | INTRAVENOUS | Status: DC | PRN

## 2015-06-20 MED ORDER — SODIUM CHLORIDE 0.9 % IJ SOLN
INTRAMUSCULAR | Status: AC
  Filled 2015-06-20: qty 50

## 2015-06-20 MED ORDER — ENOXAPARIN SODIUM 30 MG/0.3ML ~~LOC~~ SOLN
30.0000 mg | Freq: Two times a day (BID) | SUBCUTANEOUS | Status: DC
  Administered 2015-06-21 – 2015-06-22 (×3): 30 mg via SUBCUTANEOUS
  Filled 2015-06-20 (×3): qty 0.3

## 2015-06-20 MED ORDER — DEXAMETHASONE SODIUM PHOSPHATE 10 MG/ML IJ SOLN
INTRAMUSCULAR | Status: DC | PRN
  Administered 2015-06-20: 4 mg via INTRAVENOUS

## 2015-06-20 MED ORDER — EPHEDRINE SULFATE 50 MG/ML IJ SOLN
INTRAMUSCULAR | Status: DC | PRN
  Administered 2015-06-20: 5 mg via INTRAVENOUS

## 2015-06-20 MED ORDER — HYOSCYAMINE SULFATE 0.125 MG PO TABS
0.1250 mg | ORAL_TABLET | Freq: Four times a day (QID) | ORAL | Status: DC | PRN
  Filled 2015-06-20: qty 1

## 2015-06-20 MED ORDER — BUPIVACAINE-EPINEPHRINE (PF) 0.25% -1:200000 IJ SOLN
INTRAMUSCULAR | Status: AC
  Filled 2015-06-20: qty 30

## 2015-06-20 MED ORDER — KETAMINE HCL 10 MG/ML IJ SOLN
INTRAMUSCULAR | Status: DC | PRN
  Administered 2015-06-20: 50 mg via INTRAVENOUS

## 2015-06-20 MED ORDER — PHENYLEPHRINE HCL 10 MG/ML IJ SOLN
INTRAMUSCULAR | Status: DC | PRN
  Administered 2015-06-20 (×5): 100 ug via INTRAVENOUS

## 2015-06-20 MED ORDER — CEFAZOLIN SODIUM-DEXTROSE 2-3 GM-% IV SOLR
INTRAVENOUS | Status: AC
  Filled 2015-06-20: qty 50

## 2015-06-20 MED ORDER — SIMVASTATIN 40 MG PO TABS: 40.0000 mg | ORAL_TABLET | Freq: Every day | ORAL | Status: DC

## 2015-06-20 MED ORDER — CALCIUM CARBONATE ANTACID 500 MG PO CHEW
1.5000 | CHEWABLE_TABLET | Freq: Every evening | ORAL | Status: DC | PRN
  Administered 2015-06-20: 300 mg via ORAL
  Filled 2015-06-20: qty 1.5
  Filled 2015-06-20: qty 1

## 2015-06-20 MED ORDER — BUPIVACAINE-EPINEPHRINE 0.25% -1:200000 IJ SOLN
INTRAMUSCULAR | Status: DC | PRN
  Administered 2015-06-20: 30 mL

## 2015-06-20 MED ORDER — BUPIVACAINE HCL (PF) 0.5 % IJ SOLN
INTRAMUSCULAR | Status: DC | PRN
  Administered 2015-06-20: 3 mL

## 2015-06-20 MED ORDER — CELECOXIB 200 MG PO CAPS
200.0000 mg | ORAL_CAPSULE | Freq: Two times a day (BID) | ORAL | Status: DC
  Administered 2015-06-20 – 2015-06-22 (×3): 200 mg via ORAL
  Filled 2015-06-20 (×3): qty 1

## 2015-06-20 MED ORDER — ACETAMINOPHEN 10 MG/ML IV SOLN
INTRAVENOUS | Status: DC | PRN
  Administered 2015-06-20: 1000 mg via INTRAVENOUS

## 2015-06-20 MED ORDER — HEPARIN SOD (PORK) LOCK FLUSH 100 UNIT/ML IV SOLN
INTRAVENOUS | Status: AC
  Filled 2015-06-20: qty 5

## 2015-06-20 MED ORDER — ONDANSETRON HCL 4 MG PO TABS: 4.0000 mg | ORAL_TABLET | Freq: Four times a day (QID) | ORAL | Status: DC | PRN

## 2015-06-20 MED ORDER — TRAMADOL HCL 50 MG PO TABS
50.0000 mg | ORAL_TABLET | ORAL | Status: DC | PRN
  Administered 2015-06-20 – 2015-06-21 (×4): 100 mg via ORAL
  Filled 2015-06-20 (×3): qty 2
  Filled 2015-06-20 (×2): qty 1
  Filled 2015-06-20: qty 2

## 2015-06-20 MED ORDER — NEOMYCIN-POLYMYXIN B GU 40-200000 IR SOLN
Status: AC
  Filled 2015-06-20: qty 20

## 2015-06-20 MED ORDER — SODIUM CHLORIDE 0.9 % IV SOLN
1000.0000 mg | INTRAVENOUS | Status: AC
  Administered 2015-06-20: 1000 mg via INTRAVENOUS
  Filled 2015-06-20: qty 10

## 2015-06-20 MED ORDER — FENTANYL CITRATE (PF) 100 MCG/2ML IJ SOLN
INTRAMUSCULAR | Status: DC | PRN
  Administered 2015-06-20: 50 ug via INTRAVENOUS
  Administered 2015-06-20 (×2): 25 ug via INTRAVENOUS

## 2015-06-20 MED ORDER — TRAZODONE HCL 50 MG PO TABS: 25.0000 mg | ORAL_TABLET | Freq: Every evening | ORAL | Status: DC | PRN

## 2015-06-20 MED ORDER — PHENOL 1.4 % MT LIQD: 1.0000 | OROMUCOSAL | Status: DC | PRN

## 2015-06-20 MED ORDER — MAGNESIUM HYDROXIDE 400 MG/5ML PO SUSP
30.0000 mL | Freq: Every day | ORAL | Status: DC | PRN
  Administered 2015-06-21 – 2015-06-22 (×2): 30 mL via ORAL
  Filled 2015-06-20 (×2): qty 30

## 2015-06-20 MED ORDER — ATENOLOL 50 MG PO TABS
50.0000 mg | ORAL_TABLET | Freq: Two times a day (BID) | ORAL | Status: DC
  Administered 2015-06-21 – 2015-06-22 (×3): 50 mg via ORAL
  Filled 2015-06-20 (×4): qty 1

## 2015-06-20 MED ORDER — OCUVITE-LUTEIN PO CAPS
1.0000 | ORAL_CAPSULE | Freq: Every day | ORAL | Status: DC
  Administered 2015-06-22: 1 via ORAL
  Filled 2015-06-20 (×2): qty 1

## 2015-06-20 MED ORDER — CEFAZOLIN SODIUM-DEXTROSE 2-3 GM-% IV SOLR
2.0000 g | Freq: Once | INTRAVENOUS | Status: AC
  Administered 2015-06-20: 2 g via INTRAVENOUS

## 2015-06-20 MED ORDER — RISAQUAD PO CAPS
1.0000 | ORAL_CAPSULE | Freq: Every day | ORAL | Status: DC
  Administered 2015-06-22: 1 via ORAL
  Filled 2015-06-20 (×3): qty 1

## 2015-06-20 MED ORDER — METOCLOPRAMIDE HCL 5 MG/ML IJ SOLN
10.0000 mg | Freq: Once | INTRAMUSCULAR | Status: AC
  Administered 2015-06-20: 10 mg via INTRAVENOUS
  Filled 2015-06-20: qty 2

## 2015-06-20 MED ORDER — LACTATED RINGERS IV SOLN
INTRAVENOUS | Status: DC
  Administered 2015-06-20: 50 mL via INTRAVENOUS

## 2015-06-20 MED ORDER — ACETAMINOPHEN 325 MG PO TABS
650.0000 mg | ORAL_TABLET | Freq: Four times a day (QID) | ORAL | Status: DC | PRN
  Filled 2015-06-20: qty 2

## 2015-06-20 MED ORDER — CLOBETASOL PROPIONATE 0.05 % EX OINT
1.0000 "application " | TOPICAL_OINTMENT | Freq: Every day | CUTANEOUS | Status: DC | PRN
  Filled 2015-06-20: qty 15

## 2015-06-20 MED ORDER — SERTRALINE HCL 50 MG PO TABS
50.0000 mg | ORAL_TABLET | Freq: Every day | ORAL | Status: DC
  Administered 2015-06-20 – 2015-06-21 (×2): 50 mg via ORAL
  Filled 2015-06-20 (×3): qty 1

## 2015-06-20 MED ORDER — ACETAMINOPHEN 10 MG/ML IV SOLN
INTRAVENOUS | Status: AC
  Filled 2015-06-20: qty 100

## 2015-06-20 MED ORDER — ACETAMINOPHEN 650 MG RE SUPP: 650.0000 mg | Freq: Four times a day (QID) | RECTAL | Status: DC | PRN

## 2015-06-20 MED ORDER — OXYCODONE HCL 5 MG PO TABS
5.0000 mg | ORAL_TABLET | ORAL | Status: DC | PRN
  Administered 2015-06-20 – 2015-06-22 (×5): 5 mg via ORAL
  Filled 2015-06-20 (×5): qty 1

## 2015-06-20 MED ORDER — TETRACAINE HCL 1 % IJ SOLN
INTRAMUSCULAR | Status: AC
  Filled 2015-06-20: qty 2

## 2015-06-20 MED ORDER — ALUM & MAG HYDROXIDE-SIMETH 200-200-20 MG/5ML PO SUSP
30.0000 mL | ORAL | Status: DC | PRN
  Administered 2015-06-21 – 2015-06-22 (×2): 30 mL via ORAL
  Filled 2015-06-20 (×2): qty 30

## 2015-06-20 MED ORDER — FENTANYL CITRATE (PF) 100 MCG/2ML IJ SOLN: 25.0000 ug | INTRAMUSCULAR | Status: DC | PRN

## 2015-06-20 MED ORDER — BIOTIN 10 MG PO TABS: 1.0000 | ORAL_TABLET | ORAL | Status: DC

## 2015-06-20 MED ORDER — NEOMYCIN-POLYMYXIN B GU 40-200000 IR SOLN
Status: DC | PRN
  Administered 2015-06-20: 2 mL

## 2015-06-20 MED ORDER — FLEET ENEMA 7-19 GM/118ML RE ENEM: 1.0000 | ENEMA | Freq: Once | RECTAL | Status: DC | PRN

## 2015-06-20 MED ORDER — TETRACAINE HCL 1 % IJ SOLN
INTRAMUSCULAR | Status: DC | PRN
  Administered 2015-06-20: 4 mg via INTRASPINAL

## 2015-06-20 MED ORDER — EFLORNITHINE HCL 13.9 % EX CREA: 1.0000 | TOPICAL_CREAM | Freq: Every day | CUTANEOUS | Status: DC

## 2015-06-20 MED ORDER — CALCIPOTRIENE-BETAMETH DIPROP 0.005-0.064 % EX SUSP: 1.0000 | Freq: Every day | CUTANEOUS | Status: DC | PRN

## 2015-06-20 SURGICAL SUPPLY — 60 items
AUTOTRANSFUS HAS 1/8 (MISCELLANEOUS) ×2
BATTERY INSTRU NAVIGATION (MISCELLANEOUS) ×8 IMPLANT
BLADE SAW 1 (BLADE) ×2 IMPLANT
BLADE SAW 1/2 (BLADE) ×2 IMPLANT
BTRY SRG DRVR LF (MISCELLANEOUS) ×4
CANISTER SUCT 1200ML W/VALVE (MISCELLANEOUS) ×2 IMPLANT
CANISTER SUCT 3000ML (MISCELLANEOUS) ×4 IMPLANT
CAP KNEE TOTAL 3 SIGMA ×1 IMPLANT
CATH TRAY METER 16FR LF (MISCELLANEOUS) ×2 IMPLANT
CEMENT HV SMART SET (Cement) ×4 IMPLANT
COOLER POLAR GLACIER W/PUMP (MISCELLANEOUS) ×2 IMPLANT
DRAPE SHEET LG 3/4 BI-LAMINATE (DRAPES) ×2 IMPLANT
DRSG DERMACEA 8X12 NADH (GAUZE/BANDAGES/DRESSINGS) ×2 IMPLANT
DRSG OPSITE POSTOP 4X14 (GAUZE/BANDAGES/DRESSINGS) ×2 IMPLANT
DRSG TEGADERM 4X4.75 (GAUZE/BANDAGES/DRESSINGS) ×2 IMPLANT
DURAPREP 26ML APPLICATOR (WOUND CARE) ×3 IMPLANT
ELECT CAUTERY BLADE 6.4 (BLADE) ×2 IMPLANT
ELECT REM PT RETURN 9FT ADLT (ELECTROSURGICAL) ×2
ELECTRODE REM PT RTRN 9FT ADLT (ELECTROSURGICAL) ×1 IMPLANT
EX-PIN ORTHOLOCK NAV 4X150 (PIN) ×4 IMPLANT
GLOVE BIOGEL M STRL SZ7.5 (GLOVE) ×4 IMPLANT
GLOVE INDICATOR 8.0 STRL GRN (GLOVE) ×2 IMPLANT
GLOVE SURG 9.0 ORTHO LTXF (GLOVE) ×2 IMPLANT
GLOVE SURG ORTHO 9.0 STRL STRW (GLOVE) ×2 IMPLANT
GOWN STRL REUS W/ TWL LRG LVL3 (GOWN DISPOSABLE) ×2 IMPLANT
GOWN STRL REUS W/ TWL LRG LVL4 (GOWN DISPOSABLE) ×1 IMPLANT
GOWN STRL REUS W/TWL 2XL LVL3 (GOWN DISPOSABLE) ×2 IMPLANT
GOWN STRL REUS W/TWL LRG LVL3 (GOWN DISPOSABLE) ×4
GOWN STRL REUS W/TWL LRG LVL4 (GOWN DISPOSABLE) ×2
HANDPIECE SUCTION TUBG SURGILV (MISCELLANEOUS) ×2 IMPLANT
HOLDER FOLEY CATH W/STRAP (MISCELLANEOUS) ×2 IMPLANT
HOOD PEEL AWAY FLYTE STAYCOOL (MISCELLANEOUS) ×4 IMPLANT
KIT RM TURNOVER STRD PROC AR (KITS) ×2 IMPLANT
KNIFE SCULPS 14X20 (INSTRUMENTS) ×2 IMPLANT
NDL SAFETY 18GX1.5 (NEEDLE) ×2 IMPLANT
NDL SPNL 20GX3.5 QUINCKE YW (NEEDLE) ×1 IMPLANT
NEEDLE SPNL 20GX3.5 QUINCKE YW (NEEDLE) ×2 IMPLANT
NS IRRIG 500ML POUR BTL (IV SOLUTION) ×2 IMPLANT
PACK TOTAL KNEE (MISCELLANEOUS) ×2 IMPLANT
PAD WRAPON POLAR KNEE (MISCELLANEOUS) ×1 IMPLANT
PIN FIXATION 1/8DIA X 3INL (PIN) ×2 IMPLANT
SOL .9 NS 3000ML IRR  AL (IV SOLUTION) ×1
SOL .9 NS 3000ML IRR AL (IV SOLUTION) ×1
SOL .9 NS 3000ML IRR UROMATIC (IV SOLUTION) ×1 IMPLANT
SOL PREP PVP 2OZ (MISCELLANEOUS) ×2
SOLUTION PREP PVP 2OZ (MISCELLANEOUS) ×1 IMPLANT
SPONGE DRAIN TRACH 4X4 STRL 2S (GAUZE/BANDAGES/DRESSINGS) ×2 IMPLANT
STAPLER SKIN PROX 35W (STAPLE) ×2 IMPLANT
SUCTION FRAZIER HANDLE 10FR (MISCELLANEOUS) ×2
SUCTION TUBE FRAZIER 10FR DISP (MISCELLANEOUS) ×1 IMPLANT
SUT VIC AB 0 CT1 36 (SUTURE) ×2 IMPLANT
SUT VIC AB 1 CT1 36 (SUTURE) ×4 IMPLANT
SUT VIC AB 2-0 CT2 27 (SUTURE) ×2 IMPLANT
SYR 20CC LL (SYRINGE) ×2 IMPLANT
SYR 30ML LL (SYRINGE) ×2 IMPLANT
SYR 50ML LL SCALE MARK (SYRINGE) ×2 IMPLANT
SYSTEM AUTOTRANSFUS DUAL TROCR (MISCELLANEOUS) ×1 IMPLANT
TOWEL OR 17X26 4PK STRL BLUE (TOWEL DISPOSABLE) ×2 IMPLANT
TOWER CARTRIDGE SMART MIX (DISPOSABLE) ×2 IMPLANT
WRAPON POLAR PAD KNEE (MISCELLANEOUS) ×2

## 2015-06-20 NOTE — Brief Op Note (Signed)
06/20/2015  4:15 PM  PATIENT:  [name] [name]  75 y.o. female  PRE-OPERATIVE DIAGNOSIS:  primary osteoarthritis of the left knee  POST-OPERATIVE DIAGNOSIS:  Same  PROCEDURE:  Procedure(s): COMPUTER ASSISTED TOTAL KNEE ARTHROPLASTY (Left)  SURGEON:  Surgeon(s) and Role:    * Dereck Leep, MD - Primary  ASSISTANTS: none   ANESTHESIA:   spinal  EBL:  Total I/O In: 1750 [I.V.:1750] Out: 250 [Urine:200; Blood:50]  BLOOD ADMINISTERED:none  DRAINS: 2 medium drains to a reinfusion system   LOCAL MEDICATIONS USED:  MARCAINE    and OTHER Exparel  SPECIMEN:  No Specimen  DISPOSITION OF SPECIMEN:  N/A  COUNTS:  YES  TOURNIQUET:   111 minutes  DICTATION: .Dragon Dictation  PLAN OF CARE: Admit to inpatient   PATIENT DISPOSITION:  PACU - hemodynamically stable.   Delay start of Pharmacological VTE agent (>24hrs) due to surgical blood loss or risk of bleeding: yes

## 2015-06-20 NOTE — Progress Notes (Signed)

## 2015-06-20 NOTE — Progress Notes (Signed)
MD paged patient n/v not relieved by PRN medication.

## 2015-06-20 NOTE — Anesthesia Procedure Notes (Addendum)
Date/Time: 06/20/2015 12:18 PM Performed by: Johnna Acosta Pre-anesthesia Checklist: Patient identified, Emergency Drugs available, Suction available and Patient being monitored Patient Re-evaluated:Patient Re-evaluated prior to inductionOxygen Delivery Method: Simple face mask   Spinal  Start time: 06/20/2015 12:06 PM End time: 06/20/2015 12:13 PM Staffing Anesthesiologist: Alvin Critchley Resident/CRNA: Johnna Acosta Performed by: resident/CRNA  Preanesthetic Checklist Completed: patient identified, site marked, surgical consent, pre-op evaluation, timeout performed, IV checked, risks and benefits discussed and monitors and equipment checked Spinal Block Patient position: sitting Prep: ChloraPrep Patient monitoring: continuous pulse ox and blood pressure Approach: midline Location: L3-4 Injection technique: single-shot Needle Needle type: Whitacre  Needle gauge: 25 G Assessment Sensory level: T4

## 2015-06-20 NOTE — Transfer of Care (Signed)
Immediate Anesthesia Transfer of Care Note  Patient: [name] [name]  Procedure(s) Performed: Procedure(s): COMPUTER ASSISTED TOTAL KNEE ARTHROPLASTY (Left)  Patient Location: PACU  Anesthesia Type:Spinal  Level of Consciousness: awake, alert  and oriented  Airway & Oxygen Therapy: Patient Spontanous Breathing and Patient connected to face mask oxygen  Post-op Assessment: Report given to RN and Post -op Vital signs reviewed and stable  Post vital signs: Reviewed and stable  Last Vitals:  Filed Vitals:   06/20/15 1017 06/20/15 1613  BP: 135/81 104/59  Pulse: 77 76  Temp: 36.5 C   Resp: 16 16    Complications: No apparent anesthesia complications

## 2015-06-20 NOTE — Op Note (Signed)
OPERATIVE NOTE  DATE OF SURGERY:  06/20/2015  PATIENT NAME:  [name] [name]   DOB: Nov 09, 1940  MRN: [id number]  PRE-OPERATIVE DIAGNOSIS: Degenerative arthrosis of the left knee, primary  POST-OPERATIVE DIAGNOSIS:  Same  PROCEDURE:  Left total knee arthroplasty using computer-assisted navigation  SURGEON:  Marciano Sequin. M.D.  ASSISTANT:  [name], PA (present and scrubbed throughout the case, critical for assistance with exposure, retraction, instrumentation, and closure)  ANESTHESIA: spinal  ESTIMATED BLOOD LOSS: 50 mL  FLUIDS REPLACED: 1750 mL of crystalloid  TOURNIQUET TIME: 111 minutes  DRAINS: 2 medium drains to a reinfusion system  SOFT TISSUE RELEASES: Anterior cruciate ligament, posterior cruciate ligament, deep and superficial medial collateral ligament, patellofemoral ligament   IMPLANTS UTILIZED: DePuy PFC Sigma size 3 posterior stabilized femoral component (cemented), size 2.5 MBT tibial component (cemented), 32 mm 3 peg oval dome patella (cemented), and a 10 mm stabilized rotating platform polyethylene insert.  INDICATIONS FOR SURGERY: [name] [name] is a 75 y.o. year old female with a long history of progressive knee pain. X-rays demonstrated severe degenerative changes in tricompartmental fashion. The patient had not seen any significant improvement despite conservative nonsurgical intervention. After discussion of the risks and benefits of surgical intervention, the patient expressed understanding of the risks benefits and agree with plans for total knee arthroplasty.   The risks, benefits, and alternatives were discussed at length including but not limited to the risks of infection, bleeding, nerve injury, stiffness, blood clots, the need for revision surgery, cardiopulmonary complications, among others, and they were willing to proceed.  PROCEDURE IN DETAIL: The patient was brought into the operating room and, after adequate spinal anesthesia was achieved, a  tourniquet was placed on the patient's upper thigh. The patient's knee and leg were cleaned and prepped with alcohol and DuraPrep and draped in the usual sterile fashion. A "timeout" was performed as per usual protocol. The lower extremity was exsanguinated using an Esmarch, and the tourniquet was inflated to 300 mmHg. An anterior longitudinal incision was made followed by a standard mid vastus approach. The deep fibers of the medial collateral ligament were elevated in a subperiosteal fashion off of the medial flare of the tibia so as to maintain a continuous soft tissue sleeve. The patella was subluxed laterally and the patellofemoral ligament was incised. Inspection of the knee demonstrated severe degenerative changes with full-thickness loss of articular cartilage. Osteophytes were debrided using a rongeur. Anterior and posterior cruciate ligaments were excised. Two 4.0 mm Schanz pins were inserted in the femur and into the tibia for attachment of the array of trackers used for computer-assisted navigation. Hip center was identified using a circumduction technique. Distal landmarks were mapped using the computer. The distal femur and proximal tibia were mapped using the computer. The distal femoral cutting guide was positioned using computer-assisted navigation so as to achieve a 5 distal valgus cut. The femur was sized and it was felt that a size 3 femoral component was appropriate. A size 3 femoral cutting guide was positioned and the anterior cut was performed and verified using the computer. This was followed by completion of the posterior and chamfer cuts. Femoral cutting guide for the central box was then positioned in the center box cut was performed.  Attention was then directed to the proximal tibia. Medial and lateral menisci were excised. The extramedullary tibial cutting guide was positioned using computer-assisted navigation so as to achieve a 0 varus-valgus alignment and 0 posterior slope. The  cut was performed  and verified using the computer. The proximal tibia was sized and it was felt that a size 2.5 tibial tray was appropriate. Tibial and femoral trials were inserted followed by insertion of a 10 mm polyethylene insert. The knee was felt to be tight both in flexion and extension. The trial components were removed and the extramedullary tibial cutting guide was repositioned so as to remove an additional 2 mm of bone. The proximal tibia was then recut. This allowed for excellent mediolateral soft tissue balancing both in flexion and in full extension. Finally, the patella was cut and prepared so as to accommodate a 32 mm 3 peg oval dome patella. A patella trial was placed and the knee was placed through a range of motion with excellent patellar tracking appreciated. The femoral trial was removed after debridement of posterior osteophytes. The central post-hole for the tibial component was reamed followed by insertion of a keel punch. Tibial trials were then removed. Cut surfaces of bone were irrigated with copious amounts of normal saline with antibiotic solution using pulsatile lavage and then suctioned dry. Polymethylmethacrylate cement was prepared in the usual fashion using a vacuum mixer. Cement was applied to the cut surface of the proximal tibia as well as along the undersurface of a size 2.5 MBT tibial component. Tibial component was positioned and impacted into place. Excess cement was removed using [employer] fast streamer. Cement was then applied to the cut surfaces of the femur as well as along the posterior flanges of the size 3 femoral component. The femoral component was positioned and impacted into place. Excess cement was removed using [employer] fast streamer. A 10 mm polyethylene trial was inserted and the knee was brought into full extension with steady axial compression applied. Finally, cement was applied to the backside of a 32 mm 3 peg oval dome patella and the patellar component was positioned  and patellar clamp applied. Excess cement was removed using [employer] fast streamer. After adequate curing of the cement, the tourniquet was deflated after a total tourniquet time of 111 minutes. Hemostasis was achieved using electrocautery. The knee was irrigated with copious amounts of normal saline with antibiotic solution using pulsatile lavage and then suctioned dry. 20 mL of 1.3% Exparel in 40 mL of normal saline was injected along the posterior capsule, medial and lateral gutters, and along the arthrotomy site. A 10 mm stabilized rotating platform polyethylene insert was inserted and the knee was placed through a range of motion with excellent mediolateral soft tissue balancing appreciated and excellent patellar tracking noted. 2 medium drains were placed in the wound bed and brought out through separate stab incisions to be attached to a reinfusion system. The medial parapatellar portion of the incision was reapproximated using interrupted sutures of #1 Vicryl. Subcutaneous tissue was then injected with a total of 30 cc of 0.25% Marcaine with epinephrine. Subcutaneous tissue was approximated in layers using first #0 Vicryl followed #2-0 Vicryl. The skin was approximated with skin staples. A sterile dressing was applied.  The patient tolerated the procedure well and was transported to the recovery room in stable condition.    James P. Holley Bouche., M.D.

## 2015-06-20 NOTE — H&P (Signed)
The patient has been re-examined, and the chart reviewed, and there have been no interval changes to the documented history and physical.    The risks, benefits, and alternatives have been discussed at length. The patient expressed understanding of the risks benefits and agreed with plans for surgical intervention.  James P. Hooten, Jr. M.D.    

## 2015-06-20 NOTE — Anesthesia Preprocedure Evaluation (Addendum)
Anesthesia Evaluation  Patient identified by MRN, date of birth, ID band Patient awake    Reviewed: Allergy & Precautions, NPO status , Patient's Chart, lab work & pertinent test results  Airway Mallampati: III  TM Distance: <3 FB     Dental  (+) Chipped, Caps   Pulmonary sleep apnea and Continuous Positive Airway Pressure Ventilation ,    Pulmonary exam normal breath sounds clear to auscultation       Cardiovascular hypertension, Pt. on medications and Pt. on home beta blockers Normal cardiovascular exam     Neuro/Psych  Headaches, Anxiety Depression    GI/Hepatic Neg liver ROS, GERD  Medicated and Controlled,  Endo/Other  negative endocrine ROS  Renal/GU negative Renal ROS  negative genitourinary   Musculoskeletal  (+) Arthritis , Osteoarthritis,    Abdominal Normal abdominal exam  (+)   Peds negative pediatric ROS (+)  Hematology negative hematology ROS (+)   Anesthesia Other Findings   Reproductive/Obstetrics                            Anesthesia Physical Anesthesia Plan  ASA: III  Anesthesia Plan: Spinal   Post-op Pain Management:    Induction: Intravenous  Airway Management Planned: Nasal Cannula  Additional Equipment:   Intra-op Plan:   Post-operative Plan:   Informed Consent: I have reviewed the patients History and Physical, chart, labs and discussed the procedure including the risks, benefits and alternatives for the proposed anesthesia with the patient or authorized representative who has indicated his/her understanding and acceptance.   Dental advisory given  Plan Discussed with: CRNA and Surgeon  Anesthesia Plan Comments:         Anesthesia Quick Evaluation

## 2015-06-21 LAB — CBC
HCT: 38 % (ref 35.0–47.0)
Hemoglobin: 12.8 g/dL (ref 12.0–16.0)
MCH: 31.1 pg (ref 26.0–34.0)
MCHC: 33.6 g/dL (ref 32.0–36.0)
MCV: 92.5 fL (ref 80.0–100.0)
Platelets: 223 10*3/uL (ref 150–440)
RBC: 4.11 MIL/uL (ref 3.80–5.20)
RDW: 13 % (ref 11.5–14.5)
WBC: 8.9 10*3/uL (ref 3.6–11.0)

## 2015-06-21 LAB — BASIC METABOLIC PANEL
Anion gap: 6 (ref 5–15)
BUN: 12 mg/dL (ref 6–20)
CO2: 25 mmol/L (ref 22–32)
Calcium: 8.6 mg/dL — ABNORMAL LOW (ref 8.9–10.3)
Chloride: 107 mmol/L (ref 101–111)
Creatinine, Ser: 0.77 mg/dL (ref 0.44–1.00)
GFR calc Af Amer: >60 mL/min (ref >60)
GFR calc non Af Amer: >60 mL/min (ref >60)
Glucose, Bld: 130 mg/dL — ABNORMAL HIGH (ref 65–99)
Potassium: 3.9 mmol/L (ref 3.5–5.1)
Sodium: 138 mmol/L (ref 135–145)

## 2015-06-21 MED ORDER — ENOXAPARIN SODIUM 30 MG/0.3ML ~~LOC~~ SOLN: 30.0000 mg | Freq: Two times a day (BID) | SUBCUTANEOUS | Status: DC

## 2015-06-21 MED ORDER — OXYCODONE HCL 5 MG PO TABS: 5.0000 mg | ORAL_TABLET | ORAL | Status: DC | PRN

## 2015-06-21 MED ORDER — TRAMADOL HCL 50 MG PO TABS: 50.0000 mg | ORAL_TABLET | ORAL | Status: DC | PRN

## 2015-06-21 NOTE — Progress Notes (Signed)
   Subjective: 1 Day Post-Op Procedure(s) (LRB): COMPUTER ASSISTED TOTAL KNEE ARTHROPLASTY (Left) Patient reports pain as 3 on 0-10 scale.   Patient is well, and has had no acute complaints or problems We will start therapy today.  Plan is to go Rehab after hospital stay. Pt would like to go to [facility] no nausea and no vomiting Patient denies any chest pains or shortness of breath. Objective: Vital signs in last 24 hours: Temp:  [97 F (36.1 C)-99.4 F (37.4 C)] 97.8 F (36.6 C) (01/26 0502) Pulse Rate:  [65-78] 78 (01/26 0502) Resp:  [11-20] 18 (01/26 0502) BP: (104-142)/(59-81) 110/66 mmHg (01/26 0502) SpO2:  [94 %-100 %] 95 % (01/26 0502) Weight:  [83.462 kg (184 lb)] 83.462 kg (184 lb) (01/25 1017) Pt still has original dressing in place Heels are non tender and elevated off the bed using rolled towels Intake/Output from previous day: 01/25 0701 - 01/26 0700 In: [id number] [P.O.:120; I.V.:2443.3; IV Piggyback:100] Out: 1385 [Urine:675; Emesis/NG output:500; Drains:160; Blood:50] Intake/Output this shift:     Recent Labs  06/21/15 0526  HGB 12.8    Recent Labs  06/21/15 0526  WBC 8.9  RBC 4.11  HCT 38.0  PLT 223    Recent Labs  06/21/15 0526  NA 138  K 3.9  CL 107  CO2 25  BUN 12  CREATININE 0.77  GLUCOSE 130*  CALCIUM 8.6*   No results for input(s): LABPT, INR in the last 72 hours.  EXAM General - Patient is Alert, Appropriate and Oriented Extremity - Neurologically intact Neurovascular intact Sensation intact distally Intact pulses distally Dorsiflexion/Plantar flexion intact Dressing - dressing C/D/I Motor Function - intact, moving foot and toes well on exam. Pt up walking with walker today to bathroom; moving very good  Past Medical History  Diagnosis Date  . Hypertension   . Sleep apnea   . GERD (gastroesophageal reflux disease)   . Hyperlipidemia   . Obesity   . Fatty liver   . IBS (irritable bowel syndrome)      Assessment/Plan: 1 Day Post-Op Procedure(s) (LRB): COMPUTER ASSISTED TOTAL KNEE ARTHROPLASTY (Left) Active Problems:   Left knee DJD   Total knee replacement status  Estimated body mass index is 35.94 kg/(m^2) as calculated from the following:   Height as of this encounter: 5' (1.524 m).   Weight as of this encounter: 83.462 kg (184 lb). Advance diet Up with therapy D/C IV fluids Plan for discharge tomorrow Discharge to SNF  Labs: reviewed DVT Prophylaxis - Lovenox, Foot Pumps and TED hose Weight-Bearing as tolerated to left leg D/C O2 and Pulse OX and try on Room Air Being working on a bowel movement Labs in am  [employer] [place] 06/21/2015, 7:41 AM

## 2015-06-21 NOTE — Evaluation (Signed)
Physical Therapy Evaluation Patient Details Name: [name] [name] MRN: [id number] DOB: [birth year] Today's Date: 06/21/2015   History of Present Illness  Pt is a 75 y.o. female s/p Left TKR on 06-20-15   Clinical Impression  Prior to admission, pt was independent without AD.  Pt lives alone on main floor of 2 story home with 5 steep steps to enter without railing.  Currently pt is min assist with bed mobility, transfers, and ambulation 20 feet with RW.  Pt was complaining of "feeling nauseous" during session but was able to perform all activities including bed ex's.  Pt would benefit from skilled PT to address noted impairments and functional limitations.  Pt currently appears appropriate for STR d/t assist levels required and increased difficulty advancing L LE with gait but will continue to assess disposition recommendations based on pt's progress with therapy.    Follow Up Recommendations SNF (pending progress)    Equipment Recommendations  Rolling walker with 5" wheels;3in1 (PT)    Recommendations for Other Services       Precautions / Restrictions Precautions Precautions: Fall Restrictions Weight Bearing Restrictions: Yes LLE Weight Bearing: Weight bearing as tolerated      Mobility  Bed Mobility Overal bed mobility: Needs Assistance Bed Mobility: Supine to Sit;Rolling Rolling: Independent   Supine to sit: Min assist     General bed mobility comments: Pt needed verbal cues for UE placement and assistance while utillzing side rail, increased time and assistance with involved LE in order to sit from supine  Transfers Overall transfer level: Needs assistance Equipment used: Rolling walker (2 wheeled) Transfers: Sit to/from Stand Sit to Stand: Min assist         General transfer comment: Pt needed verbal cues for UE placmement and assistance while utilzing side rail and RW,verbal cues for LE placement, increased time   Ambulation/Gait Ambulation/Gait assistance: Min  assist Ambulation Distance (Feet): 20 Feet Assistive device: Rolling walker (2 wheeled) Gait Pattern/deviations: Step-to pattern;Decreased stride length;Antalgic;Decreased stance time - left;Decreased step length - left Gait velocity: decreased   General Gait Details: increased UE support on RW; decreased ability for involved LE to clear floor during swing phase; decreased knee flexion during swing phase  Stairs            Wheelchair Mobility    Modified Rankin (Stroke Patients Only)       Balance Overall balance assessment: Needs assistance Sitting-balance support: Feet supported;Single extremity supported (Bed Rail) Sitting balance-Leahy Scale: Fair     Standing balance support: Bilateral upper extremity supported (RW ) Standing balance-Leahy Scale: Poor                               Pertinent Vitals/Pain Pain Assessment: 0-10 Pain Score: 4  Pain Location: Left knee Pain Descriptors / Indicators: Operative site guarding;Grimacing Pain Intervention(s): Limited activity within patient's tolerance;Monitored during session;Premedicated before session;Repositioned  See flow sheet for details.    Home Living Family/patient expects to be discharged to:: Private residence Living Arrangements: Alone     Home Access: Stairs to enter (Pt reports that the stairs are steep) Entrance Stairs-Rails: None Entrance Stairs-Number of Steps: 5 Home Layout: Two level;Able to live on main level with bedroom/bathroom Home Equipment: Walker - standard      Prior Function Level of Independence: Independent               Hand Dominance        Extremity/Trunk  Assessment   Upper Extremity Assessment: Overall WFL for tasks assessed           Lower Extremity Assessment: LLE deficits/detail   LLE Deficits / Details: Pt performed straight leg raise x10 indepedently; AROM left dorsiflexion at least 3/5;   Cervical / Trunk Assessment: Normal  Communication    Communication: No difficulties  Cognition Arousal/Alertness: Awake/alert Behavior During Therapy: WFL for tasks assessed/performed Overall Cognitive Status: Within Functional Limits for tasks assessed                      General Comments General comments (skin integrity, edema, etc.): hemovac and polar care in place  Nursing cleared pt for participation in physical therapy.  Pt agreeable to PT session. Pt is agreeable with her cousins present upon consultation. Pt reported that she would like to go to a rehabilitation facility upon discharge since she does not have anyone to help her at home.    Exercises Total Joint Exercises Ankle Circles/Pumps: Both;AROM;10 reps Quad Sets: Left;AROM;10 reps Short Arc Quad: AROM;10 reps;Left Heel Slides: AROM;10 reps;Left;AAROM (first 7 reps were AROM, last 3 reps were AAROM) Hip ABduction/ADduction: AROM;Left;10 reps Straight Leg Raises: AROM;Left;10 reps Goniometric ROM: -6 knee extension semi supine in bed; 61 knee flexion in sitting in chair      Assessment/Plan    PT Assessment Patient needs continued PT services  PT Diagnosis Difficulty walking;Acute pain   PT Problem List Decreased range of motion;Decreased activity tolerance;Decreased balance;Decreased mobility;Decreased strength;Decreased knowledge of use of DME;Pain  PT Treatment Interventions DME instruction;Gait training;Stair training;Functional mobility training;Therapeutic activities;Therapeutic exercise;Balance training;Patient/family education;Manual techniques   PT Goals (Current goals can be found in the Care Plan section) Acute Rehab PT Goals Patient Stated Goal: to go to a rehab facility  PT Goal Formulation: With patient Time For Goal Achievement: 07/05/15 Potential to Achieve Goals: Fair    Frequency BID   Barriers to discharge Decreased caregiver support      Co-evaluation               End of Session Equipment Utilized During Treatment: Gait  belt Activity Tolerance: Patient tolerated treatment well Patient left: in chair;with call bell/phone within reach (Pt was left with nurse aid for toiletting ) Nurse Communication: Mobility status         Time: [id number] PT Time Calculation (min) (ACUTE ONLY): 37 min   Charges:   PT Evaluation $PT Eval Low Complexity: 1 Procedure PT Treatments $Therapeutic Exercise: 8-22 mins   PT G Codes:       Mittie Bodo SPT [place] Torian [place] 06/21/2015, 10:56 AM Leitha Bleak, [place]

## 2015-06-21 NOTE — Evaluation (Signed)
Occupational Therapy Evaluation Patient Details Name: [name] [name] MRN: [id number] DOB: 10-12-1940 Today's Date: 06/21/2015    History of Present Illness Pt is a 75 y.o. female s/p Left TKR on 06-20-15    Clinical Impression   This patient is a 75 year old female who came to [facility] for a L total knee replacement.  Patient lives in a 2 story home alone, but can stay on first floor.  She had been independent with ADL and functional mobility. She now shows deficits with pain, mobility and activities of daily living and would benefit from Occupational Therapy for ADL/functional mobility training.      Follow Up Recommendations  SNF    Equipment Recommendations       Recommendations for Other Services       Precautions / Restrictions Precautions Precautions: Fall Restrictions Weight Bearing Restrictions: Yes LLE Weight Bearing: Weight bearing as tolerated      Mobility Bed Mobility  Transfers    Balance                              ADL                                         General ADL Comments: Patient had been independent. She now needs assist with lower body dressing. Practiced techniques for lower body dressing. Unable to reach left foot. Used hip kit to Donned/doffed socks and pants to knees (drain still in place). She required set up and minimal assist with verbal cues for technique and safety.      Vision     Perception     Praxis      Pertinent Vitals/Pain Pain Assessment: 0-10 Pain Score: 4  Pain Location: L knee Pain Descriptors / Indicators: Operative site guarding;Grimacing Pain Intervention(s): Limited activity within patient's tolerance;Monitored during session;Premedicated before session;Repositioned     Hand Dominance     Extremity/Trunk Assessment Upper Extremity Assessment Upper Extremity Assessment: Overall WFL for tasks assessed       Communication  Communication Communication: No difficulties   Cognition Arousal/Alertness: Awake/alert Behavior During Therapy: WFL for tasks assessed/performed Overall Cognitive Status: Within Functional Limits for tasks assessed                     General Comments       Exercises       Shoulder Instructions      Home Living Family/patient expects to be discharged to:: Skilled nursing facility Living Arrangements: Alone     Home Access: Stairs to enter Entrance Stairs-Number of Steps: 5 Entrance Stairs-Rails: None Home Layout: Two level;Able to live on main level with bedroom/bathroom Alternate Level Stairs-Number of Steps: flight  Alternate Level Stairs-Rails: Left           Home Equipment: Walker - standard          Prior Functioning/Environment Level of Independence: Independent             OT Diagnosis: Acute pain   OT Problem List:     OT Treatment/Interventions: Self-care/ADL training    OT Goals(Current goals can be found in the care plan section) Acute Rehab OT Goals Patient Stated Goal: to go to a rehab facility  OT Goal Formulation: With patient Time For Goal Achievement: 07/05/15 Potential to Achieve Goals:  Good  OT Frequency: Min 1X/week   Barriers to D/C:            Co-evaluation              End of Session Equipment Utilized During Treatment:  (hip kit)  Activity Tolerance:   Patient left: in chair;with call bell/phone within reach;with chair alarm set;with family/visitor present   Time: 1003-4961 OT Time Calculation (min): 35 min Charges:  OT General Charges $OT Visit: 1 Procedure OT Evaluation $OT Eval Low Complexity: 1 Procedure OT Treatments $Self Care/Home Management : 8-22 mins G-Codes:    Myrene Galas, MS/OTR/L   06/21/2015, 11:32 AM

## 2015-06-21 NOTE — Care Management Note (Signed)
Case Management Note  Patient Details  Name: [name] [name] MRN: [id number] Date of Birth: 03/31/1941  Subjective/Objective:                  Met with patient to discuss discharge planning. She is from home alone. She would like to go to [facility] at discharge; PT currently recommends SNF. She has no DME in the home as she has been independent with daily activities. She uses [facility] on [address] [phone] for Rx.  Action/Plan:  List of home health care agencies left with patient. CSW following for SNF. RNCM will continue to follow.   Expected Discharge Date:  06/23/15               Expected Discharge Plan:     In-House Referral:  Clinical Social Work  Discharge planning Services  CM Consult  Post Acute Care Choice:  Home Health, Durable Medical Equipment Choice offered to:  Patient  DME Arranged:    DME Agency:     HH Arranged:    [place] Agency:     Status of Service:  In process, will continue to follow  Medicare Important Message Given:    Date Medicare IM Given:    Medicare IM give by:    Date Additional Medicare IM Given:    Additional Medicare Important Message give by:     If discussed at [place] of Stay Meetings, dates discussed:    Additional Comments:  Marshell Garfinkel, RN 06/21/2015, 11:31 AM

## 2015-06-21 NOTE — Clinical Social Work Placement (Signed)
   CLINICAL SOCIAL WORK PLACEMENT  NOTE  Date:  06/21/2015  Patient Details  Name: [name] [name] MRN: [id number] Date of Birth: November 17, 1940  Clinical Social Work is seeking post-discharge placement for this patient at the [place] level of care (*CSW will initial, date and re-position this form in  chart as items are completed):  Yes   Patient/family provided with [facility] Work Department's list of facilities offering this level of care within the geographic area requested by the patient (or if unable, by the patient's family).  Yes   Patient/family informed of their freedom to choose among providers that offer the needed level of care, that participate in Medicare, Medicaid or managed care program needed by the patient, have an available bed and are willing to accept the patient.  Yes   Patient/family informed of [facility]'s ownership interest in [facility] and [facility], as well as of the fact that they are under no obligation to receive care at these facilities.  PASRR submitted to EDS on 06/21/15     PASRR number received on 06/21/15     Existing PASRR number confirmed on       FL2 transmitted to all facilities in geographic area requested by pt/family on 06/21/15     FL2 transmitted to all facilities within larger geographic area on       Patient informed that his/her managed care company has contracts with or will negotiate with certain facilities, including the following:            Patient/family informed of bed offers received.  Patient chooses bed at       Physician recommends and patient chooses bed at      Patient to be transferred to   on  .  Patient to be transferred to facility by       Patient family notified on   of transfer.  Name of family member notified:        PHYSICIAN       Additional Comment:    _______________________________________________ [name], LCSW 06/21/2015, 10:45 AM

## 2015-06-21 NOTE — Progress Notes (Signed)
Physical Therapy Treatment Patient Details Name: [name] [name] MRN: [id number] DOB: 17-Jan-1941 Today's Date: 06/21/2015    History of Present Illness Pt is a 75 y.o. female s/p Left TKR on 06-20-15     PT Comments    Pt performed in bed exercises and ambulated 60 feet with RW and contact guard assist.  Pt demonstrated increased need for bilateral upper extremity support through RW during ambulation d/t L knee pain and weakness.  Furthermore, as pt fatigued, pt exhibited increased bilateral upper extremity support on RW.  Distance ambulated limited d/t UE fatigue and fatigue in general (pt also reporting feeling sore from morning session). Tomorrow, will focus on increasing ambulation distance, LE strengthening, and L knee ROM.     Follow Up Recommendations  SNF     Equipment Recommendations  Rolling walker with 5" wheels;3in1 (PT)    Recommendations for Other Services       Precautions / Restrictions Precautions Precautions: Fall Restrictions Weight Bearing Restrictions: Yes LLE Weight Bearing: Weight bearing as tolerated    Mobility  Bed Mobility Overal bed mobility: Needs Assistance Bed Mobility: Supine to Sit;Rolling Rolling: Independent   Supine to sit: Min assist     General bed mobility comments: Pt needed verbal cues for UE placement and assistance while utillzing side rail, increased time and assistance with involved LE in order to sit from supine  Transfers Overall transfer level: Needs assistance Equipment used: Rolling walker (2 wheeled) Transfers: Sit to/from Stand Sit to Stand: Min assist         General transfer comment: Pt needed verbal cues for UE placmement and assistance while utilzing side rail and RW,verbal cues for LE placement, increased time   Ambulation/Gait Ambulation/Gait assistance: Min guard Ambulation Distance (Feet): 60 Feet Assistive device: Rolling walker (2 wheeled) Gait Pattern/deviations: Step-through pattern;Decreased step  length - left;Decreased stance time - left;Antalgic Gait velocity: decreased   General Gait Details: significant increased UE support on RW; decreased ability for involved LE to clear floor during swing phase; decreased knee flexion during swing phase   Stairs            Wheelchair Mobility    Modified Rankin (Stroke Patients Only)       Balance Overall balance assessment: Needs assistance Sitting-balance support: Feet supported;Single extremity supported Sitting balance-Leahy Scale: Fair     Standing balance support: Bilateral upper extremity supported Standing balance-Leahy Scale: Poor                      Cognition Arousal/Alertness: Awake/alert Behavior During Therapy: WFL for tasks assessed/performed Overall Cognitive Status: Within Functional Limits for tasks assessed                      Exercises Total Joint Exercises Ankle Circles/Pumps: Both;AROM;10 reps Quad Sets:  (Pt performed just prior to visit) Short Arc Quad: AROM;Left;10 reps Heel Slides: AROM;10 reps;Left;AAROM Hip ABduction/ADduction:  (Pt performed just prior to visit) Straight Leg Raises: AROM;Left;10 reps    General Comments General comments (skin integrity, edema, etc.): hemovac and polar care place   Pt also stated that as pt fatigued, pt felt more nauseous. Nursing cleared pt for participation in physical therapy.  Pt agreeable to PT session.       Pertinent Vitals/Pain Pain Assessment: 0-10 Pain Score: 6  Pain Location: L Knee posterior  Pain Descriptors / Indicators: Grimacing;Operative site guarding;Sore Pain Intervention(s): Limited activity within patient's tolerance;Monitored during session;Repositioned  Vitals were WNL during  therapy session.    Home Living Family/patient expects to be discharged to:: Skilled nursing facility Living Arrangements: Alone     Home Access: Stairs to enter Entrance Stairs-Rails: None Home Layout: Two level;Able to live on  main level with bedroom/bathroom Home Equipment: Walker - standard      Prior Function Level of Independence: Independent          PT Goals (current goals can now be found in the care plan section) Acute Rehab PT Goals Patient Stated Goal: to go to a rehab facility  PT Goal Formulation: With patient Time For Goal Achievement: 07/05/15 Potential to Achieve Goals: Fair Additional Goals Additional Goal #1: Pt's L knee ROM 0-90 degrees Progress towards PT goals: Progressing toward goals    Frequency  BID    PT Plan Current plan remains appropriate    Co-evaluation             End of Session Equipment Utilized During Treatment: Gait belt Activity Tolerance: Patient tolerated treatment well Patient left: in chair;with call bell/phone within reach (Pt was left with CNA for toiletting )     Time: 1414-1440 PT Time Calculation (min) (ACUTE ONLY): 26 min  Charges:  $Gait Training: 8-22 mins $Therapeutic Exercise: 8-22 mins                    G Codes:      Mittie Bodo SPT [place] Kammi Hechler 06/21/2015, 3:19 PM Leitha Bleak, [place]

## 2015-06-21 NOTE — Progress Notes (Signed)
OT Cancellation Note  Patient Details Name: [name] [name] MRN: [id number] DOB: 07/11/1940   Cancelled Treatment:    Reason Eval/Treat Not Completed: Other (comment) Patient on the commode. Will re-try time permitting.  Sharon Mt 06/21/2015, 10:21 AM

## 2015-06-21 NOTE — Discharge Instructions (Signed)

## 2015-06-21 NOTE — Progress Notes (Signed)
Patient POD 1 LTK. Patient n/v relived. Patient resting between care. Patient denies pain but states she will let nursing know. No acute distress noted.

## 2015-06-21 NOTE — Discharge Summary (Signed)
Physician Discharge Summary  Patient ID: CHRISTYNA FENDERSON MRN: [id number] DOB/AGE: 10-10-1940 75 y.o.  Admit date: 06/20/2015 Discharge date: 06/22/2015  Admission Diagnoses:  primary osteoarthritis   Discharge Diagnoses: Patient Active Problem List   Diagnosis Date Noted  . 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 Abnormal glucose 05/07/2015  . Allergic rhinitis 11/24/2014  . Adult BMI 30+ 11/24/2014  . Clinical depression 11/24/2014  . Dizziness 11/24/2014  . Fatigue 11/24/2014  . Adenopathy 12/06/2009  . Abnormal ECG 11/08/2009  . Cannot sleep 08/23/2009  . 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 Headache, migraine 05/10/2009  . HERPES SIMPLEX INFECTION 08/14/2006  . HYPERCHOLESTEROLEMIA 08/14/2006  . ANXIETY 08/14/2006  . Essential hypertension 08/14/2006  . HIATAL HERNIA 08/14/2006  . IRRITABLE BOWEL SYNDROME 08/14/2006  . 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 OSTEOARTHRITIS 08/14/2006  . PLANTAR FASCIITIS 08/14/2006    Past Medical History  Diagnosis Date  . Hypertension   . Sleep apnea   . GERD (gastroesophageal reflux disease)   . Hyperlipidemia   . Obesity   . Fatty liver   . IBS (irritable bowel syndrome)      Transfusion: Autovac transfusion given the first six hours after surgery   Consultants (if any):  Case management for placement  Discharged Condition: Improved  Hospital Course: FLOYDENE LOOBY is an 75 y.o. female who was admitted 06/20/2015 with a diagnosis of degenerative arthrosis of left knee and went to the operating room on 06/20/2015 and underwent the above named procedures.    Surgeries:Procedure(s): COMPUTER ASSISTED TOTAL KNEE ARTHROPLASTY on 06/20/2015  PRE-OPERATIVE DIAGNOSIS: Degenerative arthrosis of the left knee, primary  POST-OPERATIVE DIAGNOSIS: Same  PROCEDURE: Left total knee arthroplasty using computer-assisted navigation  SURGEON: Marciano Sequin. M.D.  ASSISTANT: [name], PA (present and scrubbed  throughout the case, critical for assistance with exposure, retraction, instrumentation, and closure)  ANESTHESIA: spinal  ESTIMATED BLOOD LOSS: 50 mL  FLUIDS REPLACED: 1750 mL of crystalloid  TOURNIQUET TIME: 111 minutes  DRAINS: 2 medium drains to a reinfusion system  SOFT TISSUE RELEASES: Anterior cruciate ligament, posterior cruciate ligament, deep and superficial medial collateral ligament, patellofemoral ligament   IMPLANTS UTILIZED: DePuy PFC Sigma size 3 posterior stabilized femoral component (cemented), size 2.5 MBT tibial component (cemented), 32 mm 3 peg oval dome patella (cemented), and a 10 mm stabilized rotating platform polyethylene insert.  INDICATIONS FOR SURGERY: SARA-JANE GEETER is a 75 y.o. year old female with a long history of progressive knee pain. X-rays demonstrated severe degenerative changes in tricompartmental fashion. The patient had not seen any significant improvement despite conservative nonsurgical intervention. After discussion of the risks and benefits of surgical intervention, the patient expressed understanding of the risks benefits and agree with plans for total knee arthroplasty.   The risks, benefits, and alternatives were discussed at length including but not limited to the risks of infection, bleeding, nerve injury, stiffness, blood clots, the need for revision surgery, cardiopulmonary complications, among others, and they were willing to proceed. Patient tolerated the surgery well. No complications .Patient was taken to PACU where she was stabilized and then transferred to the orthopedic floor.  Patient started on Lovenox 30 q 12 hrs. Foot pumps applied bilaterally at 80 mm hg. Heels elevated off bed with rolled towels. No evidence of DVT. Calves non tender. Negative Homan. Physical therapy started on day #1 for gait training and transfer with OT starting on  day #1 for ADL and assisted devices. Patient has done well  with therapy. Ambulated 60 feet upon  being discharged.  Patient's IV and foley were discontinue on day #1 and hemovac was d/c on day #2 along with dressing change.  She was given perioperative antibiotics:  Anti-infectives    Start     Dose/Rate Route Frequency Ordered Stop   06/20/15 1830  ceFAZolin (ANCEF) IVPB 2 g/50 mL premix     2 g 100 mL/hr over 30 Minutes Intravenous Every 6 hours 06/20/15 1751 06/21/15 1829   06/20/15 1015  ceFAZolin (ANCEF) IVPB 2 g/50 mL premix     2 g 100 mL/hr over 30 Minutes Intravenous  Once 06/20/15 1003 06/20/15 1238   06/20/15 0828  ceFAZolin (ANCEF) 2-3 GM-% IVPB SOLR    Comments:  SCHNIER, JULIE: cabinet override      06/20/15 0828 06/20/15 2029    .  She was fitted with AV1 compression foot pump devices bilaterally, early ambulation instructed in heel pumps, and fitted with TED stocking bilaterally for DVT prophylaxis.  She benefited maximally from the hospital stay and there were no complications.    Recent vital signs:  Filed Vitals:   06/20/15 2242 06/21/15 0502  BP: 122/67 110/66  Pulse: 75 78  Temp:  97.8 F (36.6 C)  Resp:  18    Recent laboratory studies:  Lab Results  Component Value Date   HGB 12.8 06/21/2015   HGB 15.5 03/22/2014   HGB 15.0 07/07/2013   Lab Results  Component Value Date   WBC 8.9 06/21/2015   PLT 223 06/21/2015   Lab Results  Component Value Date   INR 0.97 06/06/2015   Lab Results  Component Value Date   NA 138 06/21/2015   K 3.9 06/21/2015   CL 107 06/21/2015   CO2 25 06/21/2015   BUN 12 06/21/2015   CREATININE 0.77 06/21/2015   GLUCOSE 130* 06/21/2015    Discharge Medications:     Medication List    STOP taking these medications        aspirin EC 81 MG tablet     BIOTIN MAXIMUM STRENGTH 10 MG Tabs  Generic drug:  Biotin      TAKE these medications        ALIGN PO  Take by mouth.     atenolol 50 MG tablet  Commonly known as:  TENORMIN  TAKE 1 TABLET TWICE DAILY     calcipotriene-betamethasone external  suspension  Commonly known as:  TACLONEX SCALP  Apply 1 Dose topically daily as needed.     calcium carbonate 750 MG chewable tablet  Commonly known as:  TUMS EX  Chew 1 tablet by mouth at bedtime as needed for heartburn.     cholecalciferol 1000 units tablet  Commonly known as:  VITAMIN D  Take 1,000 Units by mouth daily. In am     clobetasol ointment 0.05 %  Commonly known as:  TEMOVATE  Apply 1 application topically daily as needed (for itching).     diclofenac sodium 1 % Gel  Commonly known as:  VOLTAREN  Apply topically.     enoxaparin 30 MG/0.3ML injection  Commonly known as:  LOVENOX  Inject 0.3 mLs (30 mg total) into the skin every 12 (twelve) hours.     GLUCOSAMINE CHONDR 1500 COMPLX PO  Take 1 tablet by mouth 2 (two) times daily.     hyoscyamine 0.125 MG tablet  Commonly known as:  LEVSIN, ANASPAZ  Take 0.125 mg by mouth daily as needed.     M2  MAGNESIUM 100 MG Caps  Generic drug:  Magnesium  Take 500 mg by mouth every morning.     multivitamin-lutein Caps capsule  Take 1 capsule by mouth daily.     oxyCODONE 5 MG immediate release tablet  Commonly known as:  Oxy IR/ROXICODONE  Take 1-2 tablets (5-10 mg total) by mouth every 4 (four) hours as needed for severe pain or breakthrough pain.     ranitidine 300 MG tablet  Commonly known as:  ZANTAC  Take 300 mg by mouth at bedtime.     sertraline 50 MG tablet  Commonly known as:  ZOLOFT  TAKE 1 TABLET EVERY DAY     simvastatin 20 MG tablet  Commonly known as:  ZOCOR  TAKE 1 TABLET EVERY DAY AT BEDTIME     traMADol 50 MG tablet  Commonly known as:  ULTRAM  Take 1-2 tablets (50-100 mg total) by mouth every 4 (four) hours as needed for moderate pain.     traZODone 50 MG tablet  Commonly known as:  DESYREL  Take 25-50 mg by mouth at bedtime as needed for sleep.     VANIQA 13.9 % cream  Generic drug:  Eflornithine HCl  Apply 1 Dose topically daily.        Diagnostic Studies: Dg Bone  Density  05/29/2015  EXAM: DUAL X-RAY ABSORPTIOMETRY (DXA) FOR BONE MINERAL DENSITY IMPRESSION: Dear Dr. Venia Minks, Your patient Annaya Altobelli completed a BMD test on 05/29/2015 using the Mona (analysis version: 14.10) manufactured by [employer]. The following summarizes the results of our evaluation. PATIENT BIOGRAPHICAL: Name: Wonnie, Fetcho Patient ID: [id number] Birth Date: Jul 02, 1940 Height: 59.0 in. Gender: Female Exam Date: 05/29/2015 Weight: 182.4 lbs. Indications: Advanced Age, Caucasian, Height Loss, History of Fracture (Adult), Postmenopausal Fractures: Right elbow, Right fibula Treatments: ASPRIN 81 MG, Multi-Vitamin with calcium, Prilosec, simvastatin ASSESSMENT: The BMD measured at Femur Total Left is 0.899 g/cm2 with a T-score of -0.9. This patient is considered normal according to [facility]) criteria. L-3 was excluded due to (degenerative changes. Site Region Measured Measured WHO Young Adult BMD Date       Age      Classification T-score AP Spine L1-L4 (L3) 05/29/2015 72.6 Normal -0.6 1.114 g/cm2 DualFemur Total Left 05/29/2015 72.6 Normal -0.9 0.899 g/cm2 World Health Organization [facility]) criteria for post-menopausal, Caucasian Women: Normal:       T-score at or above -1 SD Osteopenia:   T-score between -1 and -2.5 SD Osteoporosis: T-score at or below -2.5 SD RECOMMENDATIONS: [place] recommends that FDA-approved medical therapies be considered in postmenopausal women and men age 28 or older with a: 1. Hip or vertebral (clinical or morphometric) fracture. 2. T-score of < -2.5 at the spine or hip. 3. Ten-year fracture probability by FRAX of 3% or greater for hip fracture or 20% or greater for major osteoporotic fracture. All treatment decisions require clinical judgment and consideration of individual patient factors, including patient preferences, co-morbidities, previous drug use, risk factors not captured in the FRAX model (e.g. falls,  vitamin D deficiency, increased bone turnover, interval significant decline in bone density) and possible under - or over-estimation of fracture risk by FRAX. All patients should ensure an adequate intake of dietary calcium (1200 mg/d) and vitamin D (800 IU daily) unless contraindicated. FOLLOW-UP: People with diagnosed cases of osteoporosis or at high risk for fracture should have regular bone mineral density tests. For patients eligible for Medicare, routine testing is allowed once every 2  years. The testing frequency can be increased to one year for patients who have rapidly progressing disease, those who are receiving or discontinuing medical therapy to restore bone mass, or have additional risk factors. I have reviewed this report, and agree with the above findings. [facility] Radiology Electronically Signed   By: David  Martinique M.D.   On: 05/29/2015 14:52   Dg Knee Left Port  06/20/2015  CLINICAL DATA:  Postop left total knee arthroplasty. EXAM: PORTABLE LEFT KNEE - 1-2 VIEW COMPARISON:  None. FINDINGS: There are postsurgical changes from recent left total knee arthroplasty. The hardware is well positioned. There is no evidence of acute fracture or dislocation. Two surgical drains are in place. There is a small amount of gas within the soft tissue surrounding the knee. A small knee joint effusion. Anterior skin staples are present. IMPRESSION: No demonstrated complication following left total knee arthroplasty. Electronically Signed   By: Richardean Sale M.D.   On: 06/20/2015 17:50    Disposition: 01-Home or Self Care      Discharge Instructions    Diet - low sodium heart healthy    Complete by:  As directed      Increase activity slowly    Complete by:  As directed            Follow-up Information    Follow up with [facility] On 07/03/2015.   Specialty:  Physician Assistant   Why:  at 10:15am   Contact information:   [address] [facility] [place]  [address] [phone]       Follow up with Dereck Leep, MD On 07/31/2015.   Specialty:  Orthopedic Surgery   Why:  at 9:15am   Contact information:   [place] [address] [phone]        Signed: Watt Climes 06/21/2015, 7:49 AM

## 2015-06-21 NOTE — Progress Notes (Signed)
Patient dangled tolerated well. Foley removed

## 2015-06-21 NOTE — Clinical Social Work Note (Signed)
Clinical Social Work Assessment  Patient Details  Name: [name] [name] MRN: [id number] Date of Birth: 19-Oct-1940  Date of referral:  06/21/15               Reason for consult:  Facility Placement                Permission sought to share information with:  [name] [name] granted to share information::  Yes, Verbal Permission Granted  Name::      [name] [name]::   [name]   Relationship::     Contact Information:     Housing/Transportation Living arrangements for the past 2 months:  Uncertain of Information:  Patient Patient Interpreter Needed:  None Criminal Activity/Legal Involvement Pertinent to Current Situation/Hospitalization:  No - Comment as needed Significant Relationships:  Adult Children, Other Family Members Lives with:  Self Do you feel safe going back to the place where you live?  Yes Need for family participation in patient care:  Yes (Comment)  Care giving concerns:  Patient lives alone in [place].    Social Worker assessment / plan: Holiday representative (CSW) received SNF consult. PT is recommending SNF. CSW met with patient and her cousin and his wife were at bedside. Patient was alert and oriented and sitting up in the chair. CSW introduced self and explained role of CSW department. Patient reported that she has lived 2 years by herself in [name]. Per patient her husband passed away while a resident at [facility]. Per patient she has adult children that live in the area and provide support. CSW explained SNF process. Patient is agreeable to SNF search and prefers [name].   CSW presented bed offers. Patient chose [name]. [name] admissions coordinator at [name] is aware of accepted offer. [name] case manager is aware of above. CSW explained to patient that her insurance [facility] [name] will have to approve SNF stay. Patient verbalized her understanding. CSW also discussed  transportation options to [name]. Patient prefers to go via EMS. Patient is aware that EMS will have a co-pay.   Employment status:  Retired Nurse, adult PT Recommendations:  [name] / Referral to community resources:  [name] [name]  Patient/Family's Response to care:  Patient is agreeable to going to [name] for rehab.   Patient/Family's Understanding of and Emotional Response to Diagnosis, Current Treatment, and Prognosis:  Patient was pleasant throughout assessment.   Emotional Assessment Appearance:  Appears stated age Attitude/Demeanor/Rapport:    Affect (typically observed):  Accepting, Adaptable, Pleasant Orientation:  Oriented to Self, Oriented to Place, Oriented to  Time, Oriented to Situation Alcohol / Substance use:  Not Applicable Psych involvement (Current and /or in the community):  No (Comment)  Discharge Needs  Concerns to be addressed:  Discharge Planning Concerns Readmission within the last 30 days:  No Current discharge risk:  Dependent with Mobility Barriers to Discharge:  Continued Medical Work up   [name] Freshwater, LCSW 06/21/2015, 10:46 AM

## 2015-06-21 NOTE — Progress Notes (Signed)
Plan of care discussed with patient. Pain controlled with PRN medication. Patient states that she will notify nursing for pain medication. Patient demonstrates correct use of incentive spirometer. Hemovac in place. Foot pumps and polar.

## 2015-06-21 NOTE — Progress Notes (Signed)
Dr. Rudene Christians notifed placed new orders

## 2015-06-21 NOTE — Anesthesia Postprocedure Evaluation (Signed)
Anesthesia Post Note  Patient: [name] [name]  Procedure(s) Performed: Procedure(s) (LRB): COMPUTER ASSISTED TOTAL KNEE ARTHROPLASTY (Left)  Patient location during evaluation: Nursing Unit Anesthesia Type: Spinal Level of consciousness: awake, awake and alert and oriented Pain management: pain level controlled Vital Signs Assessment: post-procedure vital signs reviewed and stable Respiratory status: spontaneous breathing, nonlabored ventilation and respiratory function stable Cardiovascular status: blood pressure returned to baseline and stable Postop Assessment: no headache and no backache Anesthetic complications: no    Last Vitals:  Filed Vitals:   06/20/15 2242 06/21/15 0502  BP: 122/67 110/66  Pulse: 75 78  Temp:  36.6 C  Resp:  18    Last Pain:  Filed Vitals:   06/21/15 0732  PainSc: 0-No pain    LLE Motor Response: Purposeful movement (06/21/15 0732) LLE Sensation: Full sensation (06/21/15 0732) RLE Motor Response: Purposeful movement (06/21/15 0732) RLE Sensation: Full sensation (06/21/15 0732)      Ricki Miller

## 2015-06-21 NOTE — NC FL2 (Signed)
[place] LEVEL OF CARE SCREENING TOOL     IDENTIFICATION  Patient Name: [name] [name] Birthdate: November 03, 1940 Sex: female Admission Date (Current Location): 06/20/2015  [place] and [place] Number:  Engineering geologist and Address:  [facility], [address], [place], [place] [address]      Provider Number: [id number]  Attending Physician Name and Address:  Dereck Leep, MD  Relative Name and Phone Number:       Current Level of Care: Hospital Recommended Level of Care: [place] Prior Approval Number:    Date Approved/Denied:   PASRR Number:  ([id number])  Discharge Plan: SNF    Current Diagnoses: Patient Active Problem List   Diagnosis Date Noted  . 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 Abnormal glucose 05/07/2015  . Allergic rhinitis 11/24/2014  . Adult BMI 30+ 11/24/2014  . Clinical depression 11/24/2014  . Dizziness 11/24/2014  . Fatigue 11/24/2014  . Adenopathy 12/06/2009  . Abnormal ECG 11/08/2009  . Cannot sleep 08/23/2009  . 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 Headache, migraine 05/10/2009  . HERPES SIMPLEX INFECTION 08/14/2006  . HYPERCHOLESTEROLEMIA 08/14/2006  . ANXIETY 08/14/2006  . Essential hypertension 08/14/2006  . HIATAL HERNIA 08/14/2006  . IRRITABLE BOWEL SYNDROME 08/14/2006  . 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 OSTEOARTHRITIS 08/14/2006  . PLANTAR FASCIITIS 08/14/2006    Orientation RESPIRATION BLADDER Height & Weight    Self, Time, Situation, Place  Normal Continent 5' (152.4 cm) 184 lbs.  BEHAVIORAL SYMPTOMS/MOOD NEUROLOGICAL BOWEL NUTRITION STATUS   (none )  (none ) Continent Diet (Regular Diet )  AMBULATORY STATUS COMMUNICATION OF NEEDS Skin   Extensive Assist Verbally Surgical wounds (Incision: Left Knee )                       Personal Care Assistance Level of Assistance  Bathing, Feeding, Dressing Bathing Assistance: Limited  assistance Feeding assistance: Independent Dressing Assistance: Limited assistance     Functional Limitations Info  Sight, Hearing, Speech Sight Info: Impaired Hearing Info: Adequate Speech Info: Adequate    SPECIAL CARE FACTORS FREQUENCY  PT (By licensed PT), OT (By licensed OT)     PT Frequency:  (5) OT Frequency:  (5)            Contractures      Additional Factors Info  Code Status, Allergies Code Status Info:  (Full Code. ) Allergies Info:  (Hydrocodone)           Current Medications (06/21/2015):  This is the current hospital active medication list Current Facility-Administered Medications  Medication Dose Route Frequency Provider Last Rate Last Dose  . 0.9 %  sodium chloride infusion   Intravenous Continuous Dereck Leep, MD 100 mL/hr at 06/20/15 1827    . acetaminophen (TYLENOL) tablet 650 mg  650 mg Oral Q6H PRN Dereck Leep, MD       Or  . acetaminophen (TYLENOL) suppository 650 mg  650 mg Rectal Q6H PRN Dereck Leep, MD      . acidophilus (RISAQUAD) capsule 1 capsule  1 capsule Oral Daily Dereck Leep, MD   1 capsule at 06/20/15 1815  . alum & mag hydroxide-simeth (MAALOX/MYLANTA) 200-200-20 MG/5ML suspension 30 mL  30 mL Oral Q4H PRN Dereck Leep, MD      . atenolol (TENORMIN) tablet 50 mg  50 mg Oral BID Dereck Leep, MD   50 mg at 06/20/15 2200  .  bisacodyl (DULCOLAX) suppository 10 mg  10 mg Rectal Daily PRN Dereck Leep, MD      . calcium carbonate (TUMS - dosed in mg elemental calcium) chewable tablet 300 mg of elemental calcium  1.5 tablet Oral QHS PRN Dereck Leep, MD   300 mg of elemental calcium at 06/20/15 2115  . ceFAZolin (ANCEF) IVPB 2 g/50 mL premix  2 g Intravenous Q6H Dereck Leep, MD   2 g at 06/21/15 0526  . celecoxib (CELEBREX) capsule 200 mg  200 mg Oral Q12H Dereck Leep, MD   200 mg at 06/20/15 2114  . cholecalciferol (VITAMIN D) tablet 1,000 Units  1,000 Units Oral Daily Dereck Leep, MD   1,000 Units at 06/20/15  1815  . clobetasol ointment (TEMOVATE) [id number] % 1 application  1 application Topical Daily PRN Dereck Leep, MD      . diphenhydrAMINE (BENADRYL) 12.5 MG/5ML elixir 12.5-25 mg  12.5-25 mg Oral Q4H PRN Dereck Leep, MD      . enoxaparin (LOVENOX) injection 30 mg  30 mg Subcutaneous Q12H Dereck Leep, MD      . ferrous sulfate tablet 325 mg  325 mg Oral BID WC Dereck Leep, MD   325 mg at 06/20/15 1816  . hyoscyamine (LEVSIN, ANASPAZ) tablet 0.125 mg  0.125 mg Oral Q6H PRN Dereck Leep, MD      . magnesium hydroxide (MILK OF MAGNESIA) suspension 30 mL  30 mL Oral Daily PRN Dereck Leep, MD      . magnesium oxide (MAG-OX) tablet 400 mg  400 mg Oral BH-q7a Dereck Leep, MD      . menthol-cetylpyridinium (CEPACOL) lozenge 3 mg  1 lozenge Oral PRN Dereck Leep, MD       Or  . phenol (CHLORASEPTIC) mouth spray 1 spray  1 spray Mouth/Throat PRN Dereck Leep, MD      . morphine 2 MG/ML injection 2-4 mg  2-4 mg Intravenous Q4H PRN Dereck Leep, MD      . multivitamin-lutein (OCUVITE-LUTEIN) capsule 1 capsule  1 capsule Oral Daily Dereck Leep, MD   1 capsule at 06/20/15 1816  . ondansetron (ZOFRAN) tablet 4 mg  4 mg Oral Q6H PRN Dereck Leep, MD       Or  . ondansetron (ZOFRAN) injection 4 mg  4 mg Intravenous Q6H PRN Dereck Leep, MD   4 mg at 06/20/15 1801  . oxyCODONE (Oxy IR/ROXICODONE) immediate release tablet 5-10 mg  5-10 mg Oral Q4H PRN Dereck Leep, MD   5 mg at 06/20/15 1918  . promethazine (PHENERGAN) injection 25 mg  25 mg Intramuscular Q6H PRN Hessie Knows, MD      . senna-docusate (Senokot-S) tablet 1 tablet  1 tablet Oral BID Dereck Leep, MD   1 tablet at 06/20/15 2114  . sertraline (ZOLOFT) tablet 50 mg  50 mg Oral Daily Dereck Leep, MD   50 mg at 06/20/15 2113  . simvastatin (ZOCOR) tablet 40 mg  40 mg Oral q1800 Dereck Leep, MD   40 mg at 06/20/15 1816  . sodium phosphate (FLEET) 7-19 GM/118ML enema 1 enema  1 enema Rectal Once PRN Dereck Leep, MD       . traMADol Veatrice Bourbon) tablet 50-100 mg  50-100 mg Oral Q4H PRN Dereck Leep, MD   100 mg at 06/21/15 0523  . traZODone (DESYREL) tablet 25-50 mg  25-50 mg Oral  QHS PRN Dereck Leep, MD         Discharge Medications: Please see discharge summary for a list of discharge medications.  Relevant Imaging Results:  Relevant Lab Results:   Additional Information  (SSN: [id number])  [name], LCSW

## 2015-06-22 ENCOUNTER — Encounter
Admission: RE | Admit: 2015-06-22 | Discharge: 2015-06-22 | Disposition: A | Discharge status: Home / Self Care | Payer: PPO | Source: Ambulatory Visit | Attending: Internal Medicine | Admitting: Internal Medicine

## 2015-06-22 ENCOUNTER — Encounter: Payer: Self-pay | Admitting: Orthopedic Surgery

## 2015-06-22 DIAGNOSIS — E785 Hyperlipidemia, unspecified: Secondary | ICD-10-CM | POA: Diagnosis not present

## 2015-06-22 DIAGNOSIS — Z96652 Presence of left artificial knee joint: Secondary | ICD-10-CM | POA: Diagnosis not present

## 2015-06-22 DIAGNOSIS — M1990 Unspecified osteoarthritis, unspecified site: Secondary | ICD-10-CM | POA: Diagnosis not present

## 2015-06-22 DIAGNOSIS — M6281 Muscle weakness (generalized): Secondary | ICD-10-CM | POA: Diagnosis not present

## 2015-06-22 DIAGNOSIS — K760 Fatty (change of) liver, not elsewhere classified: Secondary | ICD-10-CM | POA: Diagnosis not present

## 2015-06-22 DIAGNOSIS — F329 Major depressive disorder, single episode, unspecified: Secondary | ICD-10-CM | POA: Diagnosis not present

## 2015-06-22 DIAGNOSIS — R2689 Other abnormalities of gait and mobility: Secondary | ICD-10-CM | POA: Diagnosis not present

## 2015-06-22 DIAGNOSIS — K219 Gastro-esophageal reflux disease without esophagitis: Secondary | ICD-10-CM | POA: Diagnosis not present

## 2015-06-22 DIAGNOSIS — I10 Essential (primary) hypertension: Secondary | ICD-10-CM | POA: Diagnosis not present

## 2015-06-22 DIAGNOSIS — K589 Irritable bowel syndrome without diarrhea: Secondary | ICD-10-CM | POA: Diagnosis not present

## 2015-06-22 DIAGNOSIS — E669 Obesity, unspecified: Secondary | ICD-10-CM | POA: Diagnosis not present

## 2015-06-22 DIAGNOSIS — Z471 Aftercare following joint replacement surgery: Secondary | ICD-10-CM | POA: Diagnosis not present

## 2015-06-22 LAB — CBC
HCT: 35.5 % (ref 35.0–47.0)
Hemoglobin: 12.1 g/dL (ref 12.0–16.0)
MCH: 31.6 pg (ref 26.0–34.0)
MCHC: 34.1 g/dL (ref 32.0–36.0)
MCV: 92.8 fL (ref 80.0–100.0)
Platelets: 204 10*3/uL (ref 150–440)
RBC: 3.82 MIL/uL (ref 3.80–5.20)
RDW: 13.4 % (ref 11.5–14.5)
WBC: 9.8 10*3/uL (ref 3.6–11.0)

## 2015-06-22 MED ORDER — LACTULOSE 10 GM/15ML PO SOLN
10.0000 g | Freq: Two times a day (BID) | ORAL | Status: DC | PRN
  Administered 2015-06-22: 10 g via ORAL
  Filled 2015-06-22: qty 30

## 2015-06-22 NOTE — Progress Notes (Signed)
Physical Therapy Treatment Patient Details Name: [name] [name] MRN: [id number] DOB: 05-20-1941 Today's Date: 06/22/2015    History of Present Illness Pt is a 75 y.o. female s/p Left TKR on 06-20-15     PT Comments    Pt was able to increase ambulation distance to 100 feet with RW with contact guard assist.  Pt complained of  increased nausea, increased fatigue/LE weakness, and feeling hot and "sweaty" with distance ambulated.  Pt displayed a step to gait with decreased L knee flexion during swing phase.  Will continue to progress pt with increasing ambulation distance per pt tolerance and LE ex's.   Follow Up Recommendations  SNF     Equipment Recommendations  Rolling walker with 5" wheels;3in1 (PT)    Recommendations for Other Services       Precautions / Restrictions Precautions Precautions: Fall Restrictions Weight Bearing Restrictions: Yes LLE Weight Bearing: Weight bearing as tolerated    Mobility  Bed Mobility Overal bed mobility: Needs Assistance Bed Mobility: Supine to Sit;Rolling Rolling: Independent   Supine to sit: Min guard     General bed mobility comments: Pt needed verbal cues for UE placement and assistance while utillzing side rail, increased time and assistance with involved LE in order to sit from supine  Transfers Overall transfer level: Needs assistance Equipment used: Rolling walker (2 wheeled) Transfers: Sit to/from Stand Sit to Stand: Min guard         General transfer comment: Pt needed assistance while utilzing side rail and RW, increased time   Ambulation/Gait Ambulation/Gait assistance: Min guard Ambulation Distance (Feet): 100 Feet Assistive device: Rolling walker (2 wheeled) Gait Pattern/deviations: Step-to pattern;Decreased stance time - left;Decreased step length - left;Antalgic Gait velocity: decreased   General Gait Details: increased UE support on RW; decreased knee flexion during swing phase   Stairs             Wheelchair Mobility    Modified Rankin (Stroke Patients Only)       Balance Overall balance assessment: Needs assistance Sitting-balance support: Feet supported;Single extremity supported Sitting balance-Leahy Scale: Fair     Standing balance support: Bilateral upper extremity supported (RW) Standing balance-Leahy Scale: Fair                      Cognition Arousal/Alertness: Awake/alert Behavior During Therapy: WFL for tasks assessed/performed Overall Cognitive Status: Within Functional Limits for tasks assessed                      Exercises Total Joint Exercises Ankle Circles/Pumps: Both;AROM;10 reps Heel Slides: 10 reps;Left;AAROM Straight Leg Raises: AROM;Left;10 reps Long Arc Quad: AROM;Left;10 reps;AAROM (AAROM reps 1-3, AROM 4-10) Goniometric ROM: L knee:  -10 degrees knee extension in semi supine in bed; 77 degrees knee flexion in chair    General Comments General comments (skin integrity, edema, etc.): minimal drainage was noted within bandage on L knee Nursing cleared pt for participation in physical therapy.  Pt agreeable to PT session.      Pertinent Vitals/Pain Pain Assessment: 0-10 Pain Score: 3  Pain Location: L knee posterior  Pain Descriptors / Indicators: Aching;Sore;Operative site guarding Pain Intervention(s): Limited activity within patient's tolerance;Monitored during session;Ice applied;Premedicated before session  See flow sheet for HR and O2 vitals.    Home Living                      Prior Function  PT Goals (current goals can now be found in the care plan section) Acute Rehab PT Goals Patient Stated Goal: to go to a rehab facility  PT Goal Formulation: With patient Time For Goal Achievement: 07/05/15 Potential to Achieve Goals: Fair Additional Goals Additional Goal #1: Pt's L knee ROM 0-90 degrees Progress towards PT goals: Progressing toward goals    Frequency  BID    PT Plan Current  plan remains appropriate    Co-evaluation             End of Session Equipment Utilized During Treatment: Gait belt Activity Tolerance: Patient tolerated treatment well Patient left: in chair;with call bell/phone within reach;with SCD's reapplied;with chair alarm set;with family/visitor present (towel rolls under heels, polarcare activated)     Time: [id number] PT Time Calculation (min) (ACUTE ONLY): 29 min  Charges:  $Gait Training: 8-22 mins $Therapeutic Exercise: 8-22 mins                    G Codes:      Mittie Bodo SPT [place] 06/22/2015, 10:44 AM Leitha Bleak, [place]

## 2015-06-22 NOTE — Care Management Important Message (Signed)
Important Message  Patient Details  Name: [name] [name] MRN: [id number] Date of Birth: May 23, 1941   Medicare Important Message Given:  Yes    Marshell Garfinkel, RN 06/22/2015, 9:29 AM

## 2015-06-22 NOTE — Progress Notes (Signed)
Patient is medically stable for D/C to [facility] today. Per [name] admissions coordinator at [facility] patient is going to room 209-B. RN will call report at [phone] and arrange EMS for transport. Health Team authorization has been received. Auth # [id number]. Clinical Education officer, museum (CSW) sent D/C Summary, FL2, and D/C Packet to [employer] via [employer]. Patient is aware of above. Patient's friend was at bedside and aware of above. Per patient she has notified her children of D/C today. Please reconsult if future social work needs arise. CSW signing off.   Blima Rich, LCSW [phone]

## 2015-06-22 NOTE — Progress Notes (Signed)
Pt ready for discharge. Report called to [place] at [facility]. Pt family will transfer belongings, pt has C-Pap with her for transport.

## 2015-06-22 NOTE — Clinical Social Work Placement (Signed)
   CLINICAL SOCIAL WORK PLACEMENT  NOTE  Date:  06/22/2015  Patient Details  Name: [name] [name] MRN: [id number] Date of Birth: January 18, 1941  Clinical Social Work is seeking post-discharge placement for this patient at the [place] level of care (*CSW will initial, date and re-position this form in  chart as items are completed):  Yes   Patient/family provided with [place] Work Department's list of facilities offering this level of care within the geographic area requested by the patient (or if unable, by the patient's family).  Yes   Patient/family informed of their freedom to choose among providers that offer the needed level of care, that participate in Medicare, Medicaid or managed care program needed by the patient, have an available bed and are willing to accept the patient.  Yes   Patient/family informed of [facility]'s ownership interest in [facility] and [facility], as well as of the fact that they are under no obligation to receive care at these facilities.  PASRR submitted to EDS on 06/21/15     PASRR number received on 06/21/15     Existing PASRR number confirmed on       FL2 transmitted to all facilities in geographic area requested by pt/family on 06/21/15     FL2 transmitted to all facilities within larger geographic area on       Patient informed that his/her managed care company has contracts with or will negotiate with certain facilities, including the following:        Yes   Patient/family informed of bed offers received.  Patient chooses bed at  [facility] )     Physician recommends and patient chooses bed at      Patient to be transferred to  [facility] ) on 06/22/15.  Patient to be transferred to facility by  [facility] )     Patient family notified on 06/22/15 of transfer.  Name of family member notified:   (Patient's friend is at bedside and aware of D/C. )     PHYSICIAN        Additional Comment:    _______________________________________________ [name], LCSW 06/22/2015, 1:33 PM

## 2015-06-22 NOTE — Progress Notes (Signed)
   Subjective: 2 Days Post-Op Procedure(s) (LRB): COMPUTER ASSISTED TOTAL KNEE ARTHROPLASTY (Left) Patient reports pain as mild.   Patient is well, and has had no acute complaints or problems We will start therapy today.  Plan is to go Rehab after hospital stay. no nausea and no vomiting Patient denies any chest pains or shortness of breath. Resting better.  Objective: Vital signs in last 24 hours: Temp:  [97.5 F (36.4 C)-98.6 F (37 C)] 97.8 F (36.6 C) (01/27 0729) Pulse Rate:  [66-80] 71 (01/27 0729) Resp:  [17-19] 18 (01/27 0729) BP: (106-125)/(47-68) 125/59 mmHg (01/27 0729) SpO2:  [89 %-97 %] 95 % (01/27 0729) well approximated incision Heels are non tender and elevated off the bed using rolled towels Intake/Output from previous day: 01/26 0701 - 01/27 0700 In: 720 [P.O.:720] Out: 960 [Urine:750; Drains:210] Intake/Output this shift:     Recent Labs  06/21/15 0526 06/22/15 0536  HGB 12.8 12.1    Recent Labs  06/21/15 0526 06/22/15 0536  WBC 8.9 9.8  RBC 4.11 3.82  HCT 38.0 35.5  PLT 223 204    Recent Labs  06/21/15 0526  NA 138  K 3.9  CL 107  CO2 25  BUN 12  CREATININE 0.77  GLUCOSE 130*  CALCIUM 8.6*   No results for input(s): LABPT, INR in the last 72 hours.  EXAM General - Patient is Alert, Appropriate and Oriented Extremity - Neurologically intact Neurovascular intact Sensation intact distally Intact pulses distally Dorsiflexion/Plantar flexion intact Dressing - scant drainage Motor Function - intact, moving foot and toes well on exam.    Past Medical History  Diagnosis Date  . Hypertension   . Sleep apnea   . GERD (gastroesophageal reflux disease)   . Hyperlipidemia   . Obesity   . Fatty liver   . IBS (irritable bowel syndrome)     Assessment/Plan: 2 Days Post-Op Procedure(s) (LRB): COMPUTER ASSISTED TOTAL KNEE ARTHROPLASTY (Left) Active Problems:   Left knee DJD   Total knee replacement status  Estimated body  mass index is 35.94 kg/(m^2) as calculated from the following:   Height as of this encounter: 5' (1.524 m).   Weight as of this encounter: 83.462 kg (184 lb). Up with therapy Plan for discharge tomorrow Discharge to SNF  Labs: reviewed DVT Prophylaxis - Lovenox, Foot Pumps and TED hose Weight-Bearing as tolerated to left leg hemovac d/c'd Please change dressing prior d/c  Jon R. [place] 06/22/2015, 7:41 AM

## 2015-06-25 DIAGNOSIS — I10 Essential (primary) hypertension: Secondary | ICD-10-CM | POA: Diagnosis not present

## 2015-06-25 DIAGNOSIS — S82009A Unspecified fracture of unspecified patella, initial encounter for closed fracture: Secondary | ICD-10-CM | POA: Diagnosis not present

## 2015-06-25 DIAGNOSIS — M159 Polyosteoarthritis, unspecified: Secondary | ICD-10-CM | POA: Diagnosis not present

## 2015-06-25 DIAGNOSIS — K210 Gastro-esophageal reflux disease with esophagitis: Secondary | ICD-10-CM | POA: Diagnosis not present

## 2015-06-27 ENCOUNTER — Encounter
Admission: RE | Admit: 2015-06-27 | Discharge: 2015-06-27 | Disposition: A | Discharge status: Home / Self Care | Payer: PPO | Source: Ambulatory Visit | Attending: Internal Medicine | Admitting: Internal Medicine

## 2015-06-27 DIAGNOSIS — I10 Essential (primary) hypertension: Secondary | ICD-10-CM | POA: Diagnosis not present

## 2015-06-27 DIAGNOSIS — F329 Major depressive disorder, single episode, unspecified: Secondary | ICD-10-CM | POA: Diagnosis not present

## 2015-06-27 DIAGNOSIS — Z96652 Presence of left artificial knee joint: Secondary | ICD-10-CM | POA: Diagnosis not present

## 2015-06-27 DIAGNOSIS — K589 Irritable bowel syndrome without diarrhea: Secondary | ICD-10-CM | POA: Diagnosis not present

## 2015-06-27 DIAGNOSIS — K219 Gastro-esophageal reflux disease without esophagitis: Secondary | ICD-10-CM | POA: Diagnosis not present

## 2015-06-27 DIAGNOSIS — M6281 Muscle weakness (generalized): Secondary | ICD-10-CM | POA: Diagnosis not present

## 2015-06-27 DIAGNOSIS — K760 Fatty (change of) liver, not elsewhere classified: Secondary | ICD-10-CM | POA: Diagnosis not present

## 2015-06-27 DIAGNOSIS — M1990 Unspecified osteoarthritis, unspecified site: Secondary | ICD-10-CM | POA: Diagnosis not present

## 2015-06-27 DIAGNOSIS — E669 Obesity, unspecified: Secondary | ICD-10-CM | POA: Diagnosis not present

## 2015-06-27 DIAGNOSIS — E785 Hyperlipidemia, unspecified: Secondary | ICD-10-CM | POA: Diagnosis not present

## 2015-06-27 DIAGNOSIS — R2689 Other abnormalities of gait and mobility: Secondary | ICD-10-CM | POA: Diagnosis not present

## 2015-06-27 DIAGNOSIS — Z471 Aftercare following joint replacement surgery: Secondary | ICD-10-CM | POA: Diagnosis not present

## 2015-07-06 ENCOUNTER — Telehealth: Payer: Self-pay

## 2015-07-06 NOTE — Telephone Encounter (Signed)
Called pt regarding sleep study; [facility] sent fax requesting documentation on sleep apnea/ CPAP. Pt reports [name] [name] ordered original sleep study, and pt states she is uninterested in making FU OV for sleep apnea at this time. Renaldo Fiddler, CMA

## 2015-07-10 DIAGNOSIS — Z96652 Presence of left artificial knee joint: Secondary | ICD-10-CM | POA: Diagnosis not present

## 2015-07-12 DIAGNOSIS — Z96652 Presence of left artificial knee joint: Secondary | ICD-10-CM | POA: Diagnosis not present

## 2015-07-16 DIAGNOSIS — Z96652 Presence of left artificial knee joint: Secondary | ICD-10-CM | POA: Diagnosis not present

## 2015-07-18 DIAGNOSIS — Z96652 Presence of left artificial knee joint: Secondary | ICD-10-CM | POA: Diagnosis not present

## 2015-07-20 DIAGNOSIS — Z96652 Presence of left artificial knee joint: Secondary | ICD-10-CM | POA: Diagnosis not present

## 2015-07-23 DIAGNOSIS — Z96652 Presence of left artificial knee joint: Secondary | ICD-10-CM | POA: Diagnosis not present

## 2015-07-25 DIAGNOSIS — Z96652 Presence of left artificial knee joint: Secondary | ICD-10-CM | POA: Diagnosis not present

## 2015-07-27 DIAGNOSIS — Z96652 Presence of left artificial knee joint: Secondary | ICD-10-CM | POA: Diagnosis not present

## 2015-07-31 DIAGNOSIS — Z96652 Presence of left artificial knee joint: Secondary | ICD-10-CM | POA: Diagnosis not present

## 2015-08-15 DIAGNOSIS — Z96652 Presence of left artificial knee joint: Secondary | ICD-10-CM | POA: Diagnosis not present

## 2015-08-29 DIAGNOSIS — N760 Acute vaginitis: Secondary | ICD-10-CM | POA: Diagnosis not present

## 2015-08-29 DIAGNOSIS — L900 Lichen sclerosus et atrophicus: Secondary | ICD-10-CM | POA: Diagnosis not present

## 2015-09-21 ENCOUNTER — Ambulatory Visit (INDEPENDENT_AMBULATORY_CARE_PROVIDER_SITE_OTHER): Payer: PPO | Admitting: Physician Assistant

## 2015-09-21 ENCOUNTER — Other Ambulatory Visit: Payer: Self-pay | Admitting: Physician Assistant

## 2015-09-21 ENCOUNTER — Encounter: Payer: Self-pay | Admitting: Physician Assistant

## 2015-09-21 VITALS — BP 160/90 | HR 67 | Temp 97.9°F | Resp 16 | Wt 181.2 lb

## 2015-09-21 DIAGNOSIS — M62830 Muscle spasm of back: Secondary | ICD-10-CM

## 2015-09-21 DIAGNOSIS — M549 Dorsalgia, unspecified: Secondary | ICD-10-CM | POA: Diagnosis not present

## 2015-09-21 DIAGNOSIS — R030 Elevated blood-pressure reading, without diagnosis of hypertension: Secondary | ICD-10-CM

## 2015-09-21 DIAGNOSIS — IMO0001 Reserved for inherently not codable concepts without codable children: Secondary | ICD-10-CM

## 2015-09-21 MED ORDER — METHOCARBAMOL 500 MG PO TABS: 500.0000 mg | ORAL_TABLET | Freq: Three times a day (TID) | ORAL | Status: DC | PRN

## 2015-09-21 NOTE — Patient Instructions (Signed)

## 2015-09-21 NOTE — Telephone Encounter (Signed)
Rx sent and patient notified.

## 2015-09-21 NOTE — Telephone Encounter (Signed)
Pt stated that Tawanna Sat offered a muscle relaxer but since she thought she had some at home she didn't need a new RX. Pt stated that she doesn't have any at home and would like an RX sent in today if possible. Pt stated that she doesn't know the name of the medication she had taken before but that Tawanna Sat would know. Pharmacy: [facility] [address] Please advise. Thanks TNP

## 2015-09-21 NOTE — Progress Notes (Signed)
Patient: [name] [name] Female    DOB: 03-28-1941   75 y.o.   MRN: [id number] Visit Date: 09/21/2015  Today's Provider: Mar Daring, PA-C   Chief Complaint  Patient presents with  . Back Pain   Subjective:    Back Pain This is a new problem. The current episode started today. The problem occurs constantly. The problem is unchanged. The pain is present in the thoracic spine (Left side and left shoulder). The quality of the pain is described as aching and burning. The pain does not radiate. The pain is at a severity of 9/10. The pain is moderate. The pain is the same all the time. The symptoms are aggravated by sitting. Pertinent negatives include no chest pain, fever, headaches, numbness, tingling or weakness. Treatments tried: Aleve. The treatment provided no relief.   Her mother passed away from sudden cardiac death but was 44 years old. Her father also had an MI in his 74s.      Allergies  Allergen Reactions  . Hydrocodone Nausea Only   Previous Medications   ATENOLOL (TENORMIN) 50 MG TABLET    TAKE 1 TABLET TWICE DAILY   BIOTIN 10 MG TABS    Take by mouth.   CALCIPOTRIENE-BETAMETHASONE (TACLONEX SCALP) EXTERNAL SUSPENSION    Apply 1 Dose topically daily as needed.   CALCIUM CARBONATE (TUMS EX) 750 MG CHEWABLE TABLET    Chew 1 tablet by mouth at bedtime as needed for heartburn.   CHOLECALCIFEROL (VITAMIN D) 1000 UNITS TABLET    Take 1,000 Units by mouth daily. In am   CLOBETASOL OINTMENT (TEMOVATE) 0.05 %    Apply 1 application topically daily as needed (for itching).   DICLOFENAC SODIUM (VOLTAREN) 1 % GEL    Apply topically.   EFLORNITHINE HCL (VANIQA) 13.9 % CREAM    Apply 1 Dose topically daily.   GLUCOSAMINE-CHONDROIT-VIT C-MN (GLUCOSAMINE CHONDR 1500 COMPLX PO)    Take 1 tablet by mouth 2 (two) times daily.   HYOSCYAMINE (LEVSIN, ANASPAZ) 0.125 MG TABLET    Take 0.125 mg by mouth daily as needed.    MAGNESIUM (M2 MAGNESIUM) 100 MG CAPS    Take 500 mg by mouth  every morning.    METHOCARBAMOL (ROBAXIN) 500 MG TABLET    Reported on 09/21/2015   MULTIVITAMIN-LUTEIN (OCUVITE-LUTEIN) CAPS CAPSULE    Take 1 capsule by mouth daily.   RANITIDINE (ZANTAC) 300 MG TABLET    Take 300 mg by mouth at bedtime. Reported on 09/21/2015   SERTRALINE (ZOLOFT) 50 MG TABLET    TAKE 1 TABLET EVERY DAY   SIMVASTATIN (ZOCOR) 20 MG TABLET    TAKE 1 TABLET EVERY DAY AT BEDTIME   TRAZODONE (DESYREL) 50 MG TABLET    Take 25-50 mg by mouth at bedtime as needed for sleep. Reported on 09/21/2015    Review of Systems  Constitutional: Negative for fever, chills and fatigue.  Respiratory: Negative for cough, chest tightness and shortness of breath.   Cardiovascular: Negative for chest pain, palpitations and leg swelling.  Musculoskeletal: Positive for back pain (left shoulder blader).  Neurological: Negative for tingling, weakness, numbness and headaches.    Social History  Substance Use Topics  . Smoking status: Never Smoker   . Smokeless tobacco: Never Used  . Alcohol Use: Yes     Comment: ocassional glass of wine 2-3 times a year   Objective:   BP 160/90 mmHg  Pulse 67  Temp(Src) 97.9 F (36.6 C) (Oral)  Resp 16  Wt 181 lb 3.2 oz (82.192 kg)  Physical Exam  Constitutional: She appears well-developed and well-nourished. No distress.  Neck: Normal range of motion. Neck supple. No JVD present. No spinous process tenderness and no muscular tenderness present. No tracheal deviation present. No thyromegaly present.  Cardiovascular: Normal rate, regular rhythm and normal heart sounds.  Exam reveals no gallop and no friction rub.   No murmur heard. Pulmonary/Chest: Effort normal and breath sounds normal. No respiratory distress. She has no wheezes. She has no rales.  Musculoskeletal:       Cervical back: Normal.       Thoracic back: She exhibits tenderness and spasm. She exhibits normal range of motion, no bony tenderness and normal pulse.       Back:  Lymphadenopathy:     She has no cervical adenopathy.  Skin: She is not diaphoretic.  Vitals reviewed.       Assessment & Plan:     1. Back muscle spasm She does have some robaxin at home from her TKR in January. She is going to see how many she has and will start this as prescribed on the bottle. Continue aleve 2 tabs po bid. Heating pad to area. Massage as tolerated. Call if symptoms worsen or fail to improve.  2. Mid back pain on left side EKG done due to acute onset, family history, and elevated BP. EKG was normal. - EKG 12-Lead  3. Elevated blood pressure See above medical treatment plan for #2. - EKG 12-Lead       Mar Daring, PA-C  [place]

## 2015-09-25 DIAGNOSIS — M546 Pain in thoracic spine: Secondary | ICD-10-CM | POA: Diagnosis not present

## 2015-09-25 DIAGNOSIS — M62830 Muscle spasm of back: Secondary | ICD-10-CM | POA: Diagnosis not present

## 2015-09-28 DIAGNOSIS — Z6834 Body mass index (BMI) 34.0-34.9, adult: Secondary | ICD-10-CM | POA: Diagnosis not present

## 2015-09-28 DIAGNOSIS — Z124 Encounter for screening for malignant neoplasm of cervix: Secondary | ICD-10-CM | POA: Diagnosis not present

## 2015-10-16 DIAGNOSIS — G8929 Other chronic pain: Secondary | ICD-10-CM | POA: Diagnosis not present

## 2015-10-16 DIAGNOSIS — M545 Low back pain: Secondary | ICD-10-CM | POA: Diagnosis not present

## 2015-10-18 DIAGNOSIS — M545 Low back pain: Secondary | ICD-10-CM | POA: Diagnosis not present

## 2015-10-18 DIAGNOSIS — G8929 Other chronic pain: Secondary | ICD-10-CM | POA: Diagnosis not present

## 2015-10-23 DIAGNOSIS — G8929 Other chronic pain: Secondary | ICD-10-CM | POA: Diagnosis not present

## 2015-10-23 DIAGNOSIS — M545 Low back pain: Secondary | ICD-10-CM | POA: Diagnosis not present

## 2015-10-25 DIAGNOSIS — M545 Low back pain: Secondary | ICD-10-CM | POA: Diagnosis not present

## 2015-10-25 DIAGNOSIS — G8929 Other chronic pain: Secondary | ICD-10-CM | POA: Diagnosis not present

## 2015-10-26 DIAGNOSIS — Z1231 Encounter for screening mammogram for malignant neoplasm of breast: Secondary | ICD-10-CM | POA: Diagnosis not present

## 2015-10-26 LAB — HM MAMMOGRAPHY: HM Mammogram: NORMAL (ref 0–4)

## 2015-10-30 DIAGNOSIS — M545 Low back pain: Secondary | ICD-10-CM | POA: Diagnosis not present

## 2015-10-30 DIAGNOSIS — G8929 Other chronic pain: Secondary | ICD-10-CM | POA: Diagnosis not present

## 2015-11-01 ENCOUNTER — Other Ambulatory Visit: Payer: Self-pay

## 2015-11-01 MED ORDER — RANITIDINE HCL 300 MG PO TABS: 300.0000 mg | ORAL_TABLET | Freq: Every day | ORAL | Status: DC

## 2015-11-01 NOTE — Telephone Encounter (Signed)
Mail order pharmacy requesting refill. Patient was last seen on 09/21/2015. Thanks!

## 2015-11-02 DIAGNOSIS — M545 Low back pain: Secondary | ICD-10-CM | POA: Diagnosis not present

## 2015-11-02 DIAGNOSIS — G8929 Other chronic pain: Secondary | ICD-10-CM | POA: Diagnosis not present

## 2015-11-06 DIAGNOSIS — G8929 Other chronic pain: Secondary | ICD-10-CM | POA: Diagnosis not present

## 2015-11-06 DIAGNOSIS — M545 Low back pain: Secondary | ICD-10-CM | POA: Diagnosis not present

## 2015-11-07 DIAGNOSIS — G4733 Obstructive sleep apnea (adult) (pediatric): Secondary | ICD-10-CM | POA: Diagnosis not present

## 2015-11-08 DIAGNOSIS — M545 Low back pain: Secondary | ICD-10-CM | POA: Diagnosis not present

## 2015-11-08 DIAGNOSIS — G8929 Other chronic pain: Secondary | ICD-10-CM | POA: Diagnosis not present

## 2015-11-12 DIAGNOSIS — Z1283 Encounter for screening for malignant neoplasm of skin: Secondary | ICD-10-CM | POA: Diagnosis not present

## 2015-11-12 DIAGNOSIS — L812 Freckles: Secondary | ICD-10-CM | POA: Diagnosis not present

## 2015-11-12 DIAGNOSIS — D229 Melanocytic nevi, unspecified: Secondary | ICD-10-CM | POA: Diagnosis not present

## 2015-11-12 DIAGNOSIS — L821 Other seborrheic keratosis: Secondary | ICD-10-CM | POA: Diagnosis not present

## 2015-11-12 DIAGNOSIS — L689 Hypertrichosis, unspecified: Secondary | ICD-10-CM | POA: Diagnosis not present

## 2015-11-12 DIAGNOSIS — L578 Other skin changes due to chronic exposure to nonionizing radiation: Secondary | ICD-10-CM | POA: Diagnosis not present

## 2015-11-12 DIAGNOSIS — D1800 Hemangioma unspecified site: Secondary | ICD-10-CM | POA: Diagnosis not present

## 2015-11-15 DIAGNOSIS — M545 Low back pain: Secondary | ICD-10-CM | POA: Diagnosis not present

## 2015-11-15 DIAGNOSIS — G8929 Other chronic pain: Secondary | ICD-10-CM | POA: Diagnosis not present

## 2015-12-07 DIAGNOSIS — H2512 Age-related nuclear cataract, left eye: Secondary | ICD-10-CM | POA: Diagnosis not present

## 2015-12-18 DIAGNOSIS — M1711 Unilateral primary osteoarthritis, right knee: Secondary | ICD-10-CM | POA: Diagnosis not present

## 2015-12-18 DIAGNOSIS — Z96652 Presence of left artificial knee joint: Secondary | ICD-10-CM | POA: Diagnosis not present

## 2015-12-28 ENCOUNTER — Other Ambulatory Visit: Payer: Self-pay

## 2015-12-28 DIAGNOSIS — I10 Essential (primary) hypertension: Secondary | ICD-10-CM

## 2015-12-28 MED ORDER — ATENOLOL 50 MG PO TABS: 50.0000 mg | ORAL_TABLET | Freq: Two times a day (BID) | ORAL | 1 refills | Status: DC

## 2015-12-28 NOTE — Telephone Encounter (Signed)
Pt is establishing with a new provider 01/07/2016; but will run out of atenolol before her appointment.   Thanks,   -Mickel Baas

## 2016-01-07 ENCOUNTER — Ambulatory Visit (INDEPENDENT_AMBULATORY_CARE_PROVIDER_SITE_OTHER): Payer: PPO | Admitting: Family Medicine

## 2016-01-07 ENCOUNTER — Encounter: Payer: Self-pay | Admitting: Family Medicine

## 2016-01-07 DIAGNOSIS — E668 Other obesity: Secondary | ICD-10-CM

## 2016-01-07 DIAGNOSIS — G4730 Sleep apnea, unspecified: Secondary | ICD-10-CM | POA: Insufficient documentation

## 2016-01-07 DIAGNOSIS — F329 Major depressive disorder, single episode, unspecified: Secondary | ICD-10-CM

## 2016-01-07 DIAGNOSIS — IMO0002 Reserved for concepts with insufficient information to code with codable children: Secondary | ICD-10-CM

## 2016-01-07 DIAGNOSIS — Z7189 Other specified counseling: Secondary | ICD-10-CM | POA: Insufficient documentation

## 2016-01-07 DIAGNOSIS — I10 Essential (primary) hypertension: Secondary | ICD-10-CM | POA: Diagnosis not present

## 2016-01-07 DIAGNOSIS — F32A Depression, unspecified: Secondary | ICD-10-CM

## 2016-01-07 LAB — COMPREHENSIVE METABOLIC PANEL
ALT: 16 U/L (ref 0–35)
AST: 22 U/L (ref 0–37)
Albumin: 4.2 g/dL (ref 3.5–5.2)
Alkaline Phosphatase: 82 U/L (ref 39–117)
BUN: 14 mg/dL (ref 6–23)
CO2: 27 mEq/L (ref 19–32)
Calcium: 9.9 mg/dL (ref 8.4–10.5)
Chloride: 105 mEq/L (ref 96–112)
Creatinine, Ser: 0.89 mg/dL (ref 0.40–1.20)
GFR: 66.02 mL/min (ref >60.00)
Glucose, Bld: 90 mg/dL (ref 70–99)
Potassium: 4.2 mEq/L (ref 3.5–5.1)
Sodium: 140 mEq/L (ref 135–145)
Total Bilirubin: 0.4 mg/dL (ref 0.2–1.2)
Total Protein: 6.8 g/dL (ref 6.0–8.3)

## 2016-01-07 LAB — LIPID PANEL
Cholesterol: 241 mg/dL — ABNORMAL HIGH (ref 0–200)
HDL: 42.5 mg/dL (ref >39.00)
NonHDL: 198.53
Total CHOL/HDL Ratio: 6
Triglycerides: 283 mg/dL — ABNORMAL HIGH (ref 0.0–149.0)
VLDL: 56.6 mg/dL — ABNORMAL HIGH (ref 0.0–40.0)

## 2016-01-07 LAB — LDL CHOLESTEROL, DIRECT: Direct LDL: 145 mg/dL

## 2016-01-07 MED ORDER — ATENOLOL 50 MG PO TABS: 50.0000 mg | ORAL_TABLET | Freq: Two times a day (BID) | ORAL | 3 refills | Status: DC

## 2016-01-07 MED ORDER — SERTRALINE HCL 50 MG PO TABS: 50.0000 mg | ORAL_TABLET | Freq: Every day | ORAL | 3 refills | Status: DC

## 2016-01-07 NOTE — Assessment & Plan Note (Signed)
She is working on diet and exercise.  D/w pt.

## 2016-01-07 NOTE — Progress Notes (Signed)
Pre visit review using our clinic review tool, if applicable. No additional management support is needed unless otherwise documented below in the visit note. 

## 2016-01-07 NOTE — Assessment & Plan Note (Signed)
Continue current med, recheck labs today.  D/w pt.  She agrees.  Continue work on diet and exercise.

## 2016-01-07 NOTE — Progress Notes (Signed)
New patient.  She is doing much better after L knee replacement with Dr. Marry Guan.  She has cataract surgery pending.    Her husband died with alzheimer's dementia in 2016, they were married 55 years.  Still on SSRI in the meantime.  She was prev very tearful at the time of her husband's death.  She didn't want to stop the medicine now.  H/o postpartum depression noted. She is still trying to adjust to living alone.  She has two dogs at home but o/w lives alone.    Zostavax ~2010 per patient report.  PNA vaccines prev done per patient report.  Tetanus d/w pt.  She'll check on coverage.    Daughter [name] designated if patient were incapacitated.  D/w pt.   Hypertension:    Using medication without problems or lightheadedness: yes Chest pain with exertion:no Edema:no Short of breath:no She is going to work on diet and exercise as her joints allow.    Mammogram done 10/26/15.  No evidence of malignancy.   PMH and SH reviewed  ROS: Per HPI unless specifically indicated in ROS section   Meds, vitals, and allergies reviewed.   GEN: nad, alert and oriented HEENT: mucous membranes moist NECK: supple w/o LA CV: rrr. PULM: ctab, no inc wob ABD: soft, +bs EXT: no edema

## 2016-01-07 NOTE — Assessment & Plan Note (Signed)
Her husband died with alzheimer's dementia in 2016, they were married 79 years.  Still on SSRI in the meantime.  She was prev very tearful at the time of her husband's death.  She didn't want to stop the medicine now.  H/o postpartum depression noted. She is still trying to adjust to living alone.  Continue SSRI for now.  Okay for outpatient f/u.  >30 minutes spent in face to face time with patient, >50% spent in counselling or coordination of care.

## 2016-01-07 NOTE — Patient Instructions (Addendum)
Check with your insurance to see if they will cover the tetanus shot at the pharmacy.   Go to the lab on the way out.  We'll contact you with your lab report. Take care.  Glad to see you.  Update me as needed.  I would get a flu shot each fall.

## 2016-01-07 NOTE — Assessment & Plan Note (Signed)
Daughter [name] Roof designated if patient were incapacitated. 

## 2016-01-08 ENCOUNTER — Other Ambulatory Visit: Payer: Self-pay | Admitting: Family Medicine

## 2016-01-08 DIAGNOSIS — E7800 Pure hypercholesterolemia, unspecified: Secondary | ICD-10-CM

## 2016-01-08 MED ORDER — SIMVASTATIN 20 MG PO TABS: 20.0000 mg | ORAL_TABLET | Freq: Every day | ORAL | 3 refills | Status: DC

## 2016-01-09 ENCOUNTER — Telehealth: Payer: Self-pay

## 2016-01-09 ENCOUNTER — Other Ambulatory Visit: Payer: Self-pay | Admitting: Family Medicine

## 2016-01-09 DIAGNOSIS — I10 Essential (primary) hypertension: Secondary | ICD-10-CM

## 2016-01-09 MED ORDER — ATENOLOL 25 MG PO TABS: 50.0000 mg | ORAL_TABLET | Freq: Two times a day (BID) | ORAL | 3 refills | Status: DC

## 2016-01-09 NOTE — Progress Notes (Signed)
Patient notified by telephone and verbalized understanding. 

## 2016-01-09 NOTE — Telephone Encounter (Signed)
Called patient to inform of medication change. No answer. Will try later.

## 2016-01-09 NOTE — Progress Notes (Signed)
rx changed.  Notify pt.  Thanks. See hard copy.

## 2016-01-24 DIAGNOSIS — H2512 Age-related nuclear cataract, left eye: Secondary | ICD-10-CM | POA: Diagnosis not present

## 2016-01-30 ENCOUNTER — Encounter: Payer: Self-pay | Admitting: *Deleted

## 2016-02-05 ENCOUNTER — Encounter: Payer: Self-pay | Admitting: *Deleted

## 2016-02-05 ENCOUNTER — Ambulatory Visit: Payer: PPO | Admitting: Anesthesiology

## 2016-02-05 ENCOUNTER — Encounter
Admission: RE | Disposition: A | Discharge status: Home / Self Care | Payer: Self-pay | Source: Ambulatory Visit | Attending: Ophthalmology

## 2016-02-05 ENCOUNTER — Ambulatory Visit
Admission: RE | Admit: 2016-02-05 | Discharge: 2016-02-05 | Disposition: A | Discharge status: Home / Self Care | Payer: PPO | Source: Ambulatory Visit | Attending: Ophthalmology | Admitting: Ophthalmology

## 2016-02-05 DIAGNOSIS — Z6834 Body mass index (BMI) 34.0-34.9, adult: Secondary | ICD-10-CM | POA: Diagnosis not present

## 2016-02-05 DIAGNOSIS — Z7982 Long term (current) use of aspirin: Secondary | ICD-10-CM | POA: Insufficient documentation

## 2016-02-05 DIAGNOSIS — F418 Other specified anxiety disorders: Secondary | ICD-10-CM | POA: Insufficient documentation

## 2016-02-05 DIAGNOSIS — I10 Essential (primary) hypertension: Secondary | ICD-10-CM | POA: Insufficient documentation

## 2016-02-05 DIAGNOSIS — G4730 Sleep apnea, unspecified: Secondary | ICD-10-CM | POA: Insufficient documentation

## 2016-02-05 DIAGNOSIS — Z96659 Presence of unspecified artificial knee joint: Secondary | ICD-10-CM | POA: Diagnosis not present

## 2016-02-05 DIAGNOSIS — H2512 Age-related nuclear cataract, left eye: Secondary | ICD-10-CM | POA: Diagnosis not present

## 2016-02-05 DIAGNOSIS — E669 Obesity, unspecified: Secondary | ICD-10-CM | POA: Diagnosis not present

## 2016-02-05 DIAGNOSIS — K219 Gastro-esophageal reflux disease without esophagitis: Secondary | ICD-10-CM | POA: Diagnosis not present

## 2016-02-05 DIAGNOSIS — Z79899 Other long term (current) drug therapy: Secondary | ICD-10-CM | POA: Insufficient documentation

## 2016-02-05 HISTORY — PX: CATARACT EXTRACTION W/PHACO: SHX586

## 2016-02-05 SURGERY — PHACOEMULSIFICATION, CATARACT, WITH IOL INSERTION
Anesthesia: Monitor Anesthesia Care | Site: Eye | Laterality: Left | Wound class: Clean

## 2016-02-05 MED ORDER — EPINEPHRINE HCL 1 MG/ML IJ SOLN
INTRAMUSCULAR | Status: AC
  Filled 2016-02-05: qty 1

## 2016-02-05 MED ORDER — ALFENTANIL 500 MCG/ML IJ INJ
INJECTION | INTRAMUSCULAR | Status: DC | PRN
  Administered 2016-02-05: 250 ug via INTRAVENOUS

## 2016-02-05 MED ORDER — ARMC OPHTHALMIC DILATING GEL
1.0000 "application " | OPHTHALMIC | Status: AC | PRN
  Administered 2016-02-05 (×2): 1 via OPHTHALMIC

## 2016-02-05 MED ORDER — MOXIFLOXACIN HCL 0.5 % OP SOLN: 1.0000 [drp] | OPHTHALMIC | Status: DC | PRN

## 2016-02-05 MED ORDER — CEFUROXIME OPHTHALMIC INJECTION 1 MG/0.1 ML
INJECTION | OPHTHALMIC | Status: AC
  Filled 2016-02-05: qty 0.1

## 2016-02-05 MED ORDER — TETRACAINE HCL 0.5 % OP SOLN
OPHTHALMIC | Status: AC
  Filled 2016-02-05: qty 2

## 2016-02-05 MED ORDER — MOXIFLOXACIN HCL 0.5 % OP SOLN
OPHTHALMIC | Status: AC
  Filled 2016-02-05: qty 3

## 2016-02-05 MED ORDER — NA CHONDROIT SULF-NA HYALURON 40-17 MG/ML IO SOLN
INTRAOCULAR | Status: AC
  Filled 2016-02-05: qty 1

## 2016-02-05 MED ORDER — CARBACHOL 0.01 % IO SOLN
INTRAOCULAR | Status: DC | PRN
  Administered 2016-02-05: .5 mL via INTRAOCULAR

## 2016-02-05 MED ORDER — POVIDONE-IODINE 5 % OP SOLN
1.0000 "application " | Freq: Once | OPHTHALMIC | Status: AC
  Administered 2016-02-05: 1 via OPHTHALMIC

## 2016-02-05 MED ORDER — SODIUM CHLORIDE 0.9 % IV SOLN
INTRAVENOUS | Status: DC
  Administered 2016-02-05: 09:00:00 via INTRAVENOUS

## 2016-02-05 MED ORDER — MOXIFLOXACIN HCL 0.5 % OP SOLN
OPHTHALMIC | Status: DC | PRN
  Administered 2016-02-05: 1 [drp] via OPHTHALMIC

## 2016-02-05 MED ORDER — NA CHONDROIT SULF-NA HYALURON 40-17 MG/ML IO SOLN
INTRAOCULAR | Status: DC | PRN
  Administered 2016-02-05: 1 mL via INTRAOCULAR

## 2016-02-05 MED ORDER — POVIDONE-IODINE 5 % OP SOLN
OPHTHALMIC | Status: AC
  Filled 2016-02-05: qty 30

## 2016-02-05 MED ORDER — EPINEPHRINE HCL 1 MG/ML IJ SOLN
INTRAMUSCULAR | Status: DC | PRN
  Administered 2016-02-05: 1 mL via OPHTHALMIC

## 2016-02-05 MED ORDER — CEFUROXIME OPHTHALMIC INJECTION 1 MG/0.1 ML
INJECTION | OPHTHALMIC | Status: DC | PRN
  Administered 2016-02-05: .5 mL via INTRACAMERAL

## 2016-02-05 MED ORDER — TETRACAINE HCL 0.5 % OP SOLN
1.0000 [drp] | Freq: Once | OPHTHALMIC | Status: AC
  Administered 2016-02-05: 1 [drp] via OPHTHALMIC

## 2016-02-05 MED ORDER — MIDAZOLAM HCL 2 MG/2ML IJ SOLN
INTRAMUSCULAR | Status: DC | PRN
Start: 2016-02-05 — End: 2016-02-05
  Administered 2016-02-05 (×2): 0.5 mg via INTRAVENOUS

## 2016-02-05 MED ORDER — ARMC OPHTHALMIC DILATING GEL
OPHTHALMIC | Status: AC
  Filled 2016-02-05: qty 0.25

## 2016-02-05 SURGICAL SUPPLY — 22 items
CANNULA ANT/CHMB 27G (MISCELLANEOUS) ×1 IMPLANT
CANNULA ANT/CHMB 27GA (MISCELLANEOUS) ×2 IMPLANT
CUP MEDICINE 2OZ PLAST GRAD ST (MISCELLANEOUS) ×2 IMPLANT
GLOVE BIO SURGEON STRL SZ8 (GLOVE) ×2 IMPLANT
GLOVE BIOGEL M 6.5 STRL (GLOVE) ×2 IMPLANT
GLOVE SURG LX 8.0 MICRO (GLOVE) ×1
GLOVE SURG LX STRL 8.0 MICRO (GLOVE) ×1 IMPLANT
GOWN STRL REUS W/ TWL LRG LVL3 (GOWN DISPOSABLE) ×2 IMPLANT
GOWN STRL REUS W/TWL LRG LVL3 (GOWN DISPOSABLE) ×4
LENS IOL TECNIS ITEC 15.5 (Intraocular Lens) ×1 IMPLANT
PACK CATARACT (MISCELLANEOUS) ×2 IMPLANT
PACK CATARACT BRASINGTON LX (MISCELLANEOUS) ×2 IMPLANT
PACK EYE AFTER SURG (MISCELLANEOUS) ×2 IMPLANT
SOL BSS BAG (MISCELLANEOUS) ×2
SOL PREP PVP 2OZ (MISCELLANEOUS) ×2
SOLUTION BSS BAG (MISCELLANEOUS) ×1 IMPLANT
SOLUTION PREP PVP 2OZ (MISCELLANEOUS) ×1 IMPLANT
SYR 3ML LL SCALE MARK (SYRINGE) ×2 IMPLANT
SYR 5ML LL (SYRINGE) ×2 IMPLANT
SYR TB 1ML 27GX1/2 LL (SYRINGE) ×2 IMPLANT
WATER STERILE IRR 250ML POUR (IV SOLUTION) ×2 IMPLANT
WIPE NON LINTING 3.25X3.25 (MISCELLANEOUS) ×2 IMPLANT

## 2016-02-05 NOTE — Op Note (Signed)
PREOPERATIVE DIAGNOSIS:  Nuclear sclerotic cataract of the left eye.   POSTOPERATIVE DIAGNOSIS:  nuclear sclerotic cataract left eye   OPERATIVE PROCEDURE:  Procedure(s): CATARACT EXTRACTION PHACO AND INTRAOCULAR LENS PLACEMENT (IOC)   SURGEON:  Birder Robson, MD.   ANESTHESIA:   Anesthesiologist: Andria Frames, MD CRNA: Johnna Acosta, CRNA  1.      Managed anesthesia care. 2.      Topical tetracaine drops followed by 2% Xylocaine jelly applied in the preoperative holding area.   COMPLICATIONS:  None.   TECHNIQUE:   Stop and chop   DESCRIPTION OF PROCEDURE:  The patient was examined and consented in the preoperative holding area where the aforementioned topical anesthesia was applied to the left eye and then brought back to the Operating Room where the left eye was prepped and draped in the usual sterile ophthalmic fashion and a lid speculum was placed. A paracentesis was created with the side port blade and the anterior chamber was filled with viscoelastic. A near clear corneal incision was performed with the steel keratome. A continuous curvilinear capsulorrhexis was performed with a cystotome followed by the capsulorrhexis forceps. Hydrodissection and hydrodelineation were carried out with BSS on a blunt cannula. The lens was removed in a stop and chop  technique and the remaining cortical material was removed with the irrigation-aspiration handpiece. The capsular bag was inflated with viscoelastic and the Technis [id number] lens was placed in the capsular bag without complication. The remaining viscoelastic was removed from the eye with the irrigation-aspiration handpiece. The wounds were hydrated. The anterior chamber was flushed with Miostat and the eye was inflated to physiologic pressure. 0.1 mL of cefuroxime concentration 10 mg/mL was placed in the anterior chamber. The wounds were found to be water tight. The eye was dressed with Vigamox. The patient was given protective  glasses to wear throughout the day and a shield with which to sleep tonight. The patient was also given drops with which to begin a drop regimen today and will follow-up with me in one day.  Implant Name Type Inv. Item Serial No. Manufacturer Lot No. LRB No. Used  LENS IOL DIOP 15.5 - [id number] Intraocular Lens LENS IOL DIOP 15.5 [id number]   Left 1   Procedure(s) with comments: CATARACT EXTRACTION PHACO AND INTRAOCULAR LENS PLACEMENT (IOC) (Left) - [place] 01:10 AP% 22.3 CDE 15.67 Fluid pack lot # [id number]  Electronically signed: [place] 02/05/2016 9:52 AM

## 2016-02-05 NOTE — Discharge Instructions (Signed)

## 2016-02-05 NOTE — Transfer of Care (Signed)
Immediate Anesthesia Transfer of Care Note  Patient: [name]  Procedure(s) Performed: Procedure(s) with comments: CATARACT EXTRACTION PHACO AND INTRAOCULAR LENS PLACEMENT (IOC) (Left) - [place] 01:10 AP% 22.3 CDE 15.67 Fluid pack lot # [id number]  Patient Location: PACU  Anesthesia Type:MAC  Level of Consciousness: awake, alert  and oriented  Airway & Oxygen Therapy: Patient Spontanous Breathing  Post-op Assessment: Report given to RN and Post -op Vital signs reviewed and stable  Post vital signs: Reviewed and stable  Last Vitals:  Vitals:   02/05/16 0831  BP: 138/77  Pulse: 69  Resp: 16  Temp: 36.8 C    Last Pain:  Vitals:   02/05/16 0831  TempSrc: Oral         Complications: No apparent anesthesia complications

## 2016-02-05 NOTE — Anesthesia Postprocedure Evaluation (Signed)
Anesthesia Post Note  Patient: SEIDY TRENTHAM  Procedure(s) Performed: Procedure(s) (LRB): CATARACT EXTRACTION PHACO AND INTRAOCULAR LENS PLACEMENT (IOC) (Left)  Patient location during evaluation: PACU Anesthesia Type: MAC Level of consciousness: awake and alert Pain management: pain level controlled Vital Signs Assessment: post-procedure vital signs reviewed and stable Respiratory status: spontaneous breathing, nonlabored ventilation, respiratory function stable and patient connected to nasal cannula oxygen Cardiovascular status: stable and blood pressure returned to baseline Anesthetic complications: no    Last Vitals:  Vitals:   02/05/16 0954 02/05/16 1009  BP: 128/67 139/74  Pulse:    Resp: 16 16  Temp: 36.7 C     Last Pain:  Vitals:   02/05/16 0954  TempSrc: Oral                 Precious Haws Izola Teague

## 2016-02-05 NOTE — Anesthesia Preprocedure Evaluation (Signed)
Anesthesia Evaluation  Patient identified by MRN, date of birth, ID band Patient awake    Reviewed: Allergy & Precautions, NPO status , Patient's Chart, lab work & pertinent test results  History of Anesthesia Complications Negative for: history of anesthetic complications  Airway Mallampati: II  TM Distance: >3 FB Neck ROM: limited    Dental  (+) Poor Dentition   Pulmonary sleep apnea and Continuous Positive Airway Pressure Ventilation ,    Pulmonary exam normal breath sounds clear to auscultation       Cardiovascular Exercise Tolerance: Good hypertension, Pt. on medications  Rhythm:regular Rate:Normal     Neuro/Psych  Headaches, PSYCHIATRIC DISORDERS Anxiety Depression  Neuromuscular disease    GI/Hepatic GERD  Medicated and Poorly Controlled,  Endo/Other    Renal/GU   Female GU complaint     Musculoskeletal  (+) Arthritis , Osteoarthritis,    Abdominal   Peds  Hematology   Anesthesia Other Findings Past Medical History: No date: Fatty liver No date: GERD (gastroesophageal reflux disease) No date: Hyperlipidemia No date: Hypertension No date: IBS (irritable bowel syndrome) No date: Obesity No date: Sleep apnea     Comment: uses CPAP   Reproductive/Obstetrics                             Anesthesia Physical  Anesthesia Plan  ASA: III  Anesthesia Plan: MAC   Post-op Pain Management:    Induction: Intravenous  Airway Management Planned: Nasal Cannula  Additional Equipment:   Intra-op Plan:   Post-operative Plan:   Informed Consent: I have reviewed the patients History and Physical, chart, labs and discussed the procedure including the risks, benefits and alternatives for the proposed anesthesia with the patient or authorized representative who has indicated his/her understanding and acceptance.     Plan Discussed with:   Anesthesia Plan Comments:          Anesthesia Quick Evaluation

## 2016-02-05 NOTE — Anesthesia Procedure Notes (Signed)
Procedure Name: MAC Date/Time: 02/05/2016 9:32 AM Performed by: Johnna Acosta Pre-anesthesia Checklist: Patient identified, Emergency Drugs available, Suction available, Patient being monitored and Timeout performed Patient Re-evaluated:Patient Re-evaluated prior to inductionOxygen Delivery Method: Nasal cannula

## 2016-02-05 NOTE — H&P (Signed)
  All labs reviewed. Abnormal studies sent to patients PCP when indicated.  Previous H&P reviewed, patient examined, there are NO CHANGES.  [name] [name] LOUIS9/12/20179:24 AM

## 2016-02-20 DIAGNOSIS — H2511 Age-related nuclear cataract, right eye: Secondary | ICD-10-CM | POA: Diagnosis not present

## 2016-02-21 ENCOUNTER — Encounter: Payer: Self-pay | Admitting: Family Medicine

## 2016-02-21 ENCOUNTER — Encounter: Payer: Self-pay | Admitting: *Deleted

## 2016-02-25 MED ORDER — TETRACAINE HCL 0.5 % OP SOLN: 1.0000 [drp] | Freq: Once | OPHTHALMIC | Status: DC

## 2016-02-25 MED ORDER — ARMC OPHTHALMIC DILATING DROPS: 1.0000 "application " | OPHTHALMIC | Status: DC

## 2016-02-25 MED ORDER — MOXIFLOXACIN HCL 0.5 % OP SOLN: 1.0000 [drp] | OPHTHALMIC | Status: DC

## 2016-02-25 MED ORDER — POVIDONE-IODINE 5 % OP SOLN: 1.0000 "application " | Freq: Once | OPHTHALMIC | Status: DC

## 2016-02-26 ENCOUNTER — Encounter: Payer: Self-pay | Admitting: *Deleted

## 2016-02-26 ENCOUNTER — Encounter
Admission: RE | Disposition: A | Discharge status: Home / Self Care | Payer: Self-pay | Source: Ambulatory Visit | Attending: Ophthalmology

## 2016-02-26 ENCOUNTER — Ambulatory Visit: Payer: PPO | Admitting: Anesthesiology

## 2016-02-26 ENCOUNTER — Ambulatory Visit
Admission: RE | Admit: 2016-02-26 | Discharge: 2016-02-26 | Disposition: A | Discharge status: Home / Self Care | Payer: PPO | Source: Ambulatory Visit | Attending: Ophthalmology | Admitting: Ophthalmology

## 2016-02-26 DIAGNOSIS — K589 Irritable bowel syndrome without diarrhea: Secondary | ICD-10-CM | POA: Insufficient documentation

## 2016-02-26 DIAGNOSIS — Z9849 Cataract extraction status, unspecified eye: Secondary | ICD-10-CM | POA: Insufficient documentation

## 2016-02-26 DIAGNOSIS — I10 Essential (primary) hypertension: Secondary | ICD-10-CM | POA: Insufficient documentation

## 2016-02-26 DIAGNOSIS — M1990 Unspecified osteoarthritis, unspecified site: Secondary | ICD-10-CM | POA: Diagnosis not present

## 2016-02-26 DIAGNOSIS — K219 Gastro-esophageal reflux disease without esophagitis: Secondary | ICD-10-CM | POA: Diagnosis not present

## 2016-02-26 DIAGNOSIS — G4730 Sleep apnea, unspecified: Secondary | ICD-10-CM | POA: Diagnosis not present

## 2016-02-26 DIAGNOSIS — H2511 Age-related nuclear cataract, right eye: Secondary | ICD-10-CM | POA: Diagnosis not present

## 2016-02-26 DIAGNOSIS — K760 Fatty (change of) liver, not elsewhere classified: Secondary | ICD-10-CM | POA: Insufficient documentation

## 2016-02-26 DIAGNOSIS — E7800 Pure hypercholesterolemia, unspecified: Secondary | ICD-10-CM | POA: Insufficient documentation

## 2016-02-26 DIAGNOSIS — Z96652 Presence of left artificial knee joint: Secondary | ICD-10-CM | POA: Insufficient documentation

## 2016-02-26 DIAGNOSIS — Z9049 Acquired absence of other specified parts of digestive tract: Secondary | ICD-10-CM | POA: Diagnosis not present

## 2016-02-26 HISTORY — PX: CATARACT EXTRACTION W/PHACO: SHX586

## 2016-02-26 SURGERY — PHACOEMULSIFICATION, CATARACT, WITH IOL INSERTION
Anesthesia: Monitor Anesthesia Care | Site: Eye | Laterality: Right | Wound class: Clean

## 2016-02-26 MED ORDER — MOXIFLOXACIN HCL 0.5 % OP SOLN
1.0000 [drp] | OPHTHALMIC | Status: AC
  Administered 2016-02-26 (×3): 1 [drp] via OPHTHALMIC

## 2016-02-26 MED ORDER — TETRACAINE HCL 0.5 % OP SOLN
1.0000 [drp] | Freq: Once | OPHTHALMIC | Status: AC
  Administered 2016-02-26: 1 [drp] via OPHTHALMIC

## 2016-02-26 MED ORDER — POVIDONE-IODINE 5 % OP SOLN
1.0000 "application " | Freq: Once | OPHTHALMIC | Status: AC
  Administered 2016-02-26: 1 via OPHTHALMIC

## 2016-02-26 MED ORDER — MIDAZOLAM HCL 2 MG/2ML IJ SOLN
INTRAMUSCULAR | Status: DC | PRN
  Administered 2016-02-26 (×2): 1 mg via INTRAVENOUS

## 2016-02-26 MED ORDER — LIDOCAINE HCL 3.5 % OP GEL
OPHTHALMIC | Status: AC
  Administered 2016-02-26: 1 via OPHTHALMIC
  Filled 2016-02-26: qty 1

## 2016-02-26 MED ORDER — EPINEPHRINE HCL 1 MG/ML IJ SOLN
INTRAMUSCULAR | Status: AC
  Filled 2016-02-26: qty 2

## 2016-02-26 MED ORDER — ARMC OPHTHALMIC DILATING DROPS
1.0000 "application " | OPHTHALMIC | Status: AC
  Administered 2016-02-26 (×3): 1 via OPHTHALMIC
  Filled 2016-02-26: qty 0.4

## 2016-02-26 MED ORDER — MOXIFLOXACIN HCL 0.5 % OP SOLN
OPHTHALMIC | Status: AC
  Administered 2016-02-26: 1 [drp] via OPHTHALMIC
  Filled 2016-02-26: qty 3

## 2016-02-26 MED ORDER — SODIUM CHLORIDE 0.9 % IV SOLN
INTRAVENOUS | Status: DC
  Administered 2016-02-26: 07:00:00 via INTRAVENOUS

## 2016-02-26 MED ORDER — POVIDONE-IODINE 5 % OP SOLN
OPHTHALMIC | Status: AC
  Administered 2016-02-26: 1 via OPHTHALMIC
  Filled 2016-02-26: qty 30

## 2016-02-26 MED ORDER — EPINEPHRINE HCL 1 MG/ML IJ SOLN
INTRAOCULAR | Status: DC | PRN
  Administered 2016-02-26: 1 mL via OPHTHALMIC

## 2016-02-26 MED ORDER — NA CHONDROIT SULF-NA HYALURON 40-17 MG/ML IO SOLN
INTRAOCULAR | Status: DC | PRN
  Administered 2016-02-26: 1 mL via INTRAOCULAR

## 2016-02-26 MED ORDER — LIDOCAINE HCL 3.5 % OP GEL
1.0000 "application " | Freq: Once | OPHTHALMIC | Status: AC
  Administered 2016-02-26: 1 via OPHTHALMIC

## 2016-02-26 MED ORDER — MOXIFLOXACIN HCL 0.5 % OP SOLN
OPHTHALMIC | Status: DC | PRN
  Administered 2016-02-26: 1 [drp] via OPHTHALMIC

## 2016-02-26 MED ORDER — NA CHONDROIT SULF-NA HYALURON 40-17 MG/ML IO SOLN
INTRAOCULAR | Status: AC
  Filled 2016-02-26: qty 1

## 2016-02-26 MED ORDER — LIDOCAINE HCL (PF) 4 % IJ SOLN
INTRAOCULAR | Status: DC | PRN
  Administered 2016-02-26: 4 mL via OPHTHALMIC

## 2016-02-26 MED ORDER — CEFUROXIME OPHTHALMIC INJECTION 1 MG/0.1 ML
INJECTION | OPHTHALMIC | Status: DC | PRN
  Administered 2016-02-26: 0.1 mL via INTRACAMERAL

## 2016-02-26 MED ORDER — CARBACHOL 0.01 % IO SOLN
INTRAOCULAR | Status: DC | PRN
  Administered 2016-02-26: 0.5 mL via INTRAOCULAR

## 2016-02-26 MED ORDER — TETRACAINE HCL 0.5 % OP SOLN
OPHTHALMIC | Status: AC
  Administered 2016-02-26: 1 [drp] via OPHTHALMIC
  Filled 2016-02-26: qty 2

## 2016-02-26 SURGICAL SUPPLY — 22 items
CANNULA ANT/CHMB 27G (MISCELLANEOUS) ×1 IMPLANT
CANNULA ANT/CHMB 27GA (MISCELLANEOUS) ×4 IMPLANT
CUP MEDICINE 2OZ PLAST GRAD ST (MISCELLANEOUS) ×3 IMPLANT
GLOVE BIO SURGEON STRL SZ8 (GLOVE) ×2 IMPLANT
GLOVE BIOGEL M 6.5 STRL (GLOVE) ×2 IMPLANT
GLOVE SURG LX 8.0 MICRO (GLOVE) ×1
GLOVE SURG LX STRL 8.0 MICRO (GLOVE) ×1 IMPLANT
GOWN STRL REUS W/ TWL LRG LVL3 (GOWN DISPOSABLE) ×2 IMPLANT
GOWN STRL REUS W/TWL LRG LVL3 (GOWN DISPOSABLE) ×4
LENS IOL TECNIS ITEC 20.0 (Intraocular Lens) ×1 IMPLANT
PACK CATARACT (MISCELLANEOUS) ×2 IMPLANT
PACK CATARACT BRASINGTON LX (MISCELLANEOUS) ×2 IMPLANT
PACK EYE AFTER SURG (MISCELLANEOUS) ×2 IMPLANT
SOL BSS BAG (MISCELLANEOUS) ×2
SOL PREP PVP 2OZ (MISCELLANEOUS) ×2
SOLUTION BSS BAG (MISCELLANEOUS) ×1 IMPLANT
SOLUTION PREP PVP 2OZ (MISCELLANEOUS) ×1 IMPLANT
SYR 3ML LL SCALE MARK (SYRINGE) ×3 IMPLANT
SYR 5ML LL (SYRINGE) ×2 IMPLANT
SYR TB 1ML 27GX1/2 LL (SYRINGE) ×2 IMPLANT
WATER STERILE IRR 250ML POUR (IV SOLUTION) ×2 IMPLANT
WIPE NON LINTING 3.25X3.25 (MISCELLANEOUS) ×2 IMPLANT

## 2016-02-26 NOTE — Anesthesia Preprocedure Evaluation (Signed)
Anesthesia Evaluation  Patient identified by MRN, date of birth, ID band Patient awake    Reviewed: Allergy & Precautions, H&P , NPO status , Patient's Chart, lab work & pertinent test results, reviewed documented beta blocker date and time   History of Anesthesia Complications Negative for: history of anesthetic complications  Airway Mallampati: II  TM Distance: >3 FB Neck ROM: full    Dental no notable dental hx. (+) Poor Dentition   Pulmonary neg shortness of breath, sleep apnea and Continuous Positive Airway Pressure Ventilation , neg COPD, neg recent URI,    Pulmonary exam normal breath sounds clear to auscultation       Cardiovascular Exercise Tolerance: Good hypertension, Pt. on medications (-) angina(-) CAD, (-) Past MI, (-) Cardiac Stents and (-) CABG Normal cardiovascular exam(-) dysrhythmias (-) Valvular Problems/Murmurs Rhythm:regular Rate:Normal     Neuro/Psych  Headaches, PSYCHIATRIC DISORDERS  Neuromuscular disease negative neurological ROS  negative psych ROS   GI/Hepatic Neg liver ROS, GERD  Medicated and Poorly Controlled,  Endo/Other  negative endocrine ROS  Renal/GU negative Renal ROS  Female GU complaint  negative genitourinary   Musculoskeletal  (+) Arthritis , Osteoarthritis,    Abdominal   Peds  Hematology negative hematology ROS (+)   Anesthesia Other Findings Past Medical History: No date: Fatty liver No date: GERD (gastroesophageal reflux disease) No date: Hyperlipidemia No date: Hypertension No date: IBS (irritable bowel syndrome) No date: Obesity No date: Sleep apnea     Comment: uses CPAP   Reproductive/Obstetrics negative OB ROS                             Anesthesia Physical  Anesthesia Plan  ASA: III  Anesthesia Plan: MAC   Post-op Pain Management:    Induction: Intravenous  Airway Management Planned: Nasal Cannula  Additional Equipment:    Intra-op Plan:   Post-operative Plan:   Informed Consent: I have reviewed the patients History and Physical, chart, labs and discussed the procedure including the risks, benefits and alternatives for the proposed anesthesia with the patient or authorized representative who has indicated his/her understanding and acceptance.   Dental Advisory Given  Plan Discussed with: Anesthesiologist, CRNA and Surgeon  Anesthesia Plan Comments:         Anesthesia Quick Evaluation

## 2016-02-26 NOTE — Op Note (Signed)
PREOPERATIVE DIAGNOSIS:  Nuclear sclerotic cataract of the right eye.   POSTOPERATIVE DIAGNOSIS: NUCLEAR SCLEROTIC CATARACT RIGHT EYE   OPERATIVE PROCEDURE:  Procedure(s): CATARACT EXTRACTION PHACO AND INTRAOCULAR LENS PLACEMENT (IOC)   SURGEON:  Birder Robson, MD.   ANESTHESIA:  Anesthesiologist: Martha Clan, MD CRNA: Demetrius Charity, CRNA  1.      Managed anesthesia care. 2.      Topical tetracaine drops followed by 2% Xylocaine jelly applied in the preoperative holding area.        3.   0.2 ml of epi-Shugarcaine was  placed in the anterior chamber following the paracentesis.    COMPLICATIONS:  None.   TECHNIQUE:   Stop and chop   DESCRIPTION OF PROCEDURE:  The patient was examined and consented in the preoperative holding area where the aforementioned topical anesthesia was applied to the right eye and then brought back to the Operating Room where the right eye was prepped and draped in the usual sterile ophthalmic fashion and a lid speculum was placed. A paracentesis was created with the side port blade and the anterior chamber was filled with viscoelastic. A near clear corneal incision was performed with the steel keratome. A continuous curvilinear capsulorrhexis was performed with a cystotome followed by the capsulorrhexis forceps. Hydrodissection and hydrodelineation were carried out with BSS on a blunt cannula. The lens was removed in a stop and chop  technique and the remaining cortical material was removed with the irrigation-aspiration handpiece. The capsular bag was inflated with viscoelastic and the Technis [id number]  lens was placed in the capsular bag without complication. The remaining viscoelastic was removed from the eye with the irrigation-aspiration handpiece. The wounds were hydrated. The anterior chamber was flushed with Miostat and the eye was inflated to physiologic pressure. 0.1 mL of cefuroxime concentration 10 mg/mL was placed in the anterior chamber. The wounds  were found to be water tight. The eye was dressed with Vigamox. The patient was given protective glasses to wear throughout the day and a shield with which to sleep tonight. The patient was also given drops with which to begin a drop regimen today and will follow-up with me in one day.  Implant Name Type Inv. Item Serial No. Manufacturer Lot No. LRB No. Used  LENS IOL DIOP 20.0 - [id number] Intraocular Lens LENS IOL DIOP 20.0 [id number]   Right 1   Procedure(s) with comments: CATARACT EXTRACTION PHACO AND INTRAOCULAR LENS PLACEMENT (IOC) (Right) - [place] 57.4 AP% 24.0 CDE 13.75 Fluid Pack lot # [id number]  Electronically signed: [place] 02/26/2016 8:19 AM

## 2016-02-26 NOTE — H&P (Signed)
All labs reviewed. Abnormal studies sent to patients PCP when indicated.  Previous H&P reviewed, patient examined, there are NO CHANGES.  [name] [name] LOUIS10/3/20177:54 AM

## 2016-02-26 NOTE — Anesthesia Postprocedure Evaluation (Signed)
Anesthesia Post Note  Patient: [name] [name]  Procedure(s) Performed: Procedure(s) (LRB): CATARACT EXTRACTION PHACO AND INTRAOCULAR LENS PLACEMENT (IOC) (Right)  Patient location during evaluation: PACU Anesthesia Type: MAC Level of consciousness: awake and alert and oriented Pain management: satisfactory to patient Vital Signs Assessment: post-procedure vital signs reviewed and stable Respiratory status: respiratory function stable Cardiovascular status: stable Anesthetic complications: no    Last Vitals:  Vitals:   02/26/16 0643  BP: 135/66  Pulse: 75  Resp: 20  Temp: 36.7 C    Last Pain:  Vitals:   02/26/16 0643  TempSrc: Tympanic                 Blima Singer

## 2016-02-26 NOTE — Discharge Instructions (Signed)
Eye Surgery Discharge Instructions  Expect mild scratchy sensation or mild soreness. DO NOT RUB YOUR EYE!  The day of surgery:  Minimal physical activity, but bed rest is not required  No reading, computer work, or close hand work  No bending, lifting, or straining.  May watch TV  For 24 hours:  No driving, legal decisions, or alcoholic beverages  Safety precautions  Eat anything you prefer: It is better to start with liquids, then soup then solid foods.  _____ Eye patch should be worn until postoperative exam tomorrow.  ____ Solar shield eyeglasses should be worn for comfort in the sunlight/patch while sleeping  Resume all regular medications including aspirin or Coumadin if these were discontinued prior to surgery. You may shower, bathe, shave, or wash your hair. Tylenol may be taken for mild discomfort.  Call your doctor if you experience significant pain, nausea, or vomiting, fever > 101 or other signs of infection. [phone] or [phone] Specific instructions:  Follow-up Information    Tim Lair, MD .   Specialty:  Ophthalmology Why:  October 4 at 10:40am Contact information: [place] [address] [phone]

## 2016-02-26 NOTE — Transfer of Care (Signed)
Immediate Anesthesia Transfer of Care Note  Patient: [name] [name]  Procedure(s) Performed: Procedure(s) with comments: CATARACT EXTRACTION PHACO AND INTRAOCULAR LENS PLACEMENT (IOC) (Right) - [place] 57.4 AP% 24.0 CDE 13.75 Fluid Pack lot # [id number]  Patient Location: PACU  Anesthesia Type:MAC  Level of Consciousness: awake, alert  and oriented  Airway & Oxygen Therapy: Patient Spontanous Breathing  Post-op Assessment: Report given to RN and Post -op Vital signs reviewed and stable  Post vital signs: Reviewed and stable  Last Vitals:  Vitals:   02/26/16 0643  BP: 135/66  Pulse: 75  Resp: 20  Temp: 36.7 C    Last Pain:  Vitals:   02/26/16 0643  TempSrc: Tympanic         Complications: No apparent anesthesia complications

## 2016-03-12 ENCOUNTER — Other Ambulatory Visit: Payer: PPO

## 2016-03-20 ENCOUNTER — Other Ambulatory Visit (INDEPENDENT_AMBULATORY_CARE_PROVIDER_SITE_OTHER): Payer: PPO

## 2016-03-20 DIAGNOSIS — E7800 Pure hypercholesterolemia, unspecified: Secondary | ICD-10-CM | POA: Diagnosis not present

## 2016-03-20 LAB — HEPATIC FUNCTION PANEL
ALT: 14 U/L (ref 0–35)
AST: 17 U/L (ref 0–37)
Albumin: 4 g/dL (ref 3.5–5.2)
Alkaline Phosphatase: 77 U/L (ref 39–117)
Bilirubin, Direct: 0.1 mg/dL (ref 0.0–0.3)
Total Bilirubin: 0.4 mg/dL (ref 0.2–1.2)
Total Protein: 6.8 g/dL (ref 6.0–8.3)

## 2016-03-20 LAB — LIPID PANEL
Cholesterol: 156 mg/dL (ref 0–200)
HDL: 39.6 mg/dL (ref >39.00)
LDL Cholesterol: 84 mg/dL (ref 0–99)
NonHDL: 116.43
Total CHOL/HDL Ratio: 4
Triglycerides: 162 mg/dL — ABNORMAL HIGH (ref 0.0–149.0)
VLDL: 32.4 mg/dL (ref 0.0–40.0)

## 2016-05-07 ENCOUNTER — Ambulatory Visit: Payer: PPO | Admitting: Family Medicine

## 2016-05-08 ENCOUNTER — Encounter: Payer: Self-pay | Admitting: Family Medicine

## 2016-05-08 ENCOUNTER — Ambulatory Visit (INDEPENDENT_AMBULATORY_CARE_PROVIDER_SITE_OTHER): Payer: PPO | Admitting: Family Medicine

## 2016-05-08 VITALS — BP 138/90 | HR 68 | Wt 184.0 lb

## 2016-05-08 DIAGNOSIS — Z6835 Body mass index (BMI) 35.0-35.9, adult: Secondary | ICD-10-CM | POA: Diagnosis not present

## 2016-05-08 DIAGNOSIS — R1030 Lower abdominal pain, unspecified: Secondary | ICD-10-CM

## 2016-05-08 DIAGNOSIS — R300 Dysuria: Secondary | ICD-10-CM

## 2016-05-08 LAB — POCT URINALYSIS DIP (MANUAL ENTRY)
Bilirubin, UA: NEGATIVE
Glucose, UA: NEGATIVE
Ketones, POC UA: NEGATIVE
Leukocytes, UA: NEGATIVE
Nitrite, UA: NEGATIVE
Protein Ur, POC: NEGATIVE
Spec Grav, UA: >=1.03
Urobilinogen, UA: 0.2
pH, UA: 6

## 2016-05-08 NOTE — Patient Instructions (Signed)
Please increase your fluid intake We will notify you about your culture results Let us know if symptoms worsen Exercise and stretch daily

## 2016-05-08 NOTE — Progress Notes (Signed)
Pre visit review using our clinic review tool, if applicable. No additional management support is needed unless otherwise documented below in the visit note. 

## 2016-05-08 NOTE — Progress Notes (Signed)
Subjective:    Patient ID: [name] [name], female    DOB: 09-14-1940, 75 y.o.   MRN: [id number]  HPI This is a 75 yo female who presents today with 1 week of low back pain, pelvic pressure, mild dysuria and urgency. Rarely has problems with UTI. Diarrhea several times yesterday with some abdominal pain- resolved toay. No fever. No nausea or vomiting. Admits to poor liquid intake. Has noticed urine more concentrated.   Has history of left knee replacement. Has intermittent low back and hip pain. Walks a little for exercise. Has PT exercises but hasn't been doing them. Has gained weight through the fall.   Past Medical History:  Diagnosis Date  . Fatty liver   . GERD (gastroesophageal reflux disease)   . Hyperlipidemia   . Hypertension   . IBS (irritable bowel syndrome)   . Obesity   . Sleep apnea    uses CPAP   Past Surgical History:  Procedure Laterality Date  . BLADDER SURGERY    . BREAST SURGERY     breast byopsy  . CATARACT EXTRACTION W/PHACO Left 02/05/2016   Procedure: CATARACT EXTRACTION PHACO AND INTRAOCULAR LENS PLACEMENT (IOC);  Surgeon: Birder Robson, MD;  Location: [facility] ORS;  Service: Ophthalmology;  Laterality: Left;  [place] 01:10 AP% 22.3 CDE 15.67 Fluid pack lot # [id number]  . CATARACT EXTRACTION W/PHACO Right 02/26/2016   Procedure: CATARACT EXTRACTION PHACO AND INTRAOCULAR LENS PLACEMENT (IOC);  Surgeon: Birder Robson, MD;  Location: [facility] ORS;  Service: Ophthalmology;  Laterality: Right;  [place] 57.4 AP% 24.0 CDE 13.75 Fluid Pack lot # [id number]  . CHOLECYSTECTOMY    . COLONOSCOPY WITH PROPOFOL N/A 12/18/2014   Procedure: COLONOSCOPY WITH PROPOFOL;  Surgeon: Manya Silvas, MD;  Location: [facility] ENDOSCOPY;  Service: Endoscopy;  Laterality: N/A;  . DEEP NECK LYMPH NODE BIOPSY / EXCISION    . JOINT REPLACEMENT    . KNEE ARTHROPLASTY Left 06/20/2015   Procedure: COMPUTER ASSISTED TOTAL KNEE ARTHROPLASTY;  Surgeon: Dereck Leep, MD;  Location: [facility] ORS;  Service:  Orthopedics;  Laterality: Left;  . KNEE SURGERY    . TONSILLECTOMY     Family History  Problem Relation Age of Onset  . Heart attack Mother   . Dementia Father   . Aortic aneurysm Brother   . Colon cancer Neg Hx   . Breast cancer Neg Hx    Social History  Substance Use Topics  . Smoking status: Never Smoker  . Smokeless tobacco: Never Used  . Alcohol use Yes     Comment: ocassional glass of wine 2-3 times a year      Review of Systems Per HPI    Objective:   Physical Exam  Constitutional: She is oriented to person, place, and time. She appears well-developed and well-nourished. No distress.  obese  HENT:  Head: Normocephalic and atraumatic.  Eyes: Conjunctivae are normal.  Cardiovascular: Normal rate, regular rhythm and normal heart sounds.   Pulmonary/Chest: Effort normal and breath sounds normal.  Abdominal: Soft. She exhibits no distension. There is no tenderness. There is no rebound, no guarding and no CVA tenderness.  Musculoskeletal: She exhibits no tenderness.  Neurological: She is alert and oriented to person, place, and time.  Skin: Skin is warm and dry. She is not diaphoretic.  Psychiatric: She has a normal mood and affect. Her behavior is normal. Judgment and thought content normal.  Vitals reviewed.     BP 138/90 (BP Location: Left Arm, Patient Position: Sitting, Cuff Size:  Large)   Pulse 68   Wt 184 lb (83.5 kg)   SpO2 96%   BMI 35.94 kg/m  Wt Readings from Last 3 Encounters:  05/08/16 184 lb (83.5 kg)  02/05/16 175 lb (79.4 kg)  01/07/16 178 lb 8 oz (81 kg)   Results for orders placed or performed in visit on 05/08/16  POCT urinalysis dipstick  Result Value Ref Range   Color, UA light yellow (A) yellow   Clarity, UA clear clear   Glucose, UA negative negative   Bilirubin, UA negative negative   Ketones, POC UA negative negative   Spec Grav, UA >=1.030    Blood, UA small (A) negative   pH, UA 6.0    Protein Ur, POC negative negative    Urobilinogen, UA 0.2    Nitrite, UA Negative Negative   Leukocytes, UA Negative Negative       Assessment & Plan:  1. Dysuria - urine dipstick clear, will check culture - POCT urinalysis dipstick - Urine culture - encouraged her to aggressively hydrate over next several days  2. Lower abdominal pain - suspect she has had a mild viral illness with diarrhea yesterday - POCT urinalysis dipstick - Urine culture  3. BMI 35.0-35.9,adult - encouraged increased exercise,and perform PT exercises   Clarene Reamer, FNP-BC  [facility], [place]  05/08/2016 9:06 AM

## 2016-05-09 LAB — URINE CULTURE

## 2016-06-03 ENCOUNTER — Telehealth: Payer: Self-pay | Admitting: *Deleted

## 2016-06-03 DIAGNOSIS — I10 Essential (primary) hypertension: Secondary | ICD-10-CM

## 2016-06-03 NOTE — Telephone Encounter (Signed)
Fax received stating that Atenolol 25 mg is on manufacturer backorder due to unavailability of active ingredient.  The backorder has been ongoing and currently has no resolution date.  Please send a new Rx to replace the Atenolol 25 mg. Tablet.  Fax is in your In Box.

## 2016-06-04 MED ORDER — METOPROLOL TARTRATE 50 MG PO TABS: 50.0000 mg | ORAL_TABLET | Freq: Two times a day (BID) | ORAL | 3 refills | Status: DC

## 2016-06-04 NOTE — Telephone Encounter (Signed)
Change to metoprolol 50mg  BID.  If lightheaded, if pulse <60, or if BP <110/<70, then cut dose in half and update me.   If pulse >100 or BP >140/>90, then let me know so we can work on dosing.  Thanks.  rx sent.  And she can take her last dose of atenolol one night and then start metoprolol the next AM when she gets the rx.

## 2016-06-04 NOTE — Telephone Encounter (Signed)
Pt left v/m ;[facility] in [place] has atenolol 50 mg.

## 2016-06-04 NOTE — Telephone Encounter (Signed)
Pt called back and said she does not need refill of atenolol now; pt received refill atenolol 25 mg in Nov 2017. Pt request cb. Pt will be getting all meds thru [facility] and not mail order.

## 2016-06-04 NOTE — Telephone Encounter (Signed)
Patient notified as instructed by telephone and verbalized understanding. Patient stated that she does not want to change medication. Patient stated that there is a pharmacy in [place] that has the Atenolol 50 mg and she will call back with the name of the pharmacy. Patient stated that she has been taking Atenolol 25 mg two twice a day, but would like to get the 50 mg and take one twice a day. Patient stated that she will call back with the name of the pharmacy that has this dose. Patient stated that she is going to call her insurance company and tell them the problem that she is having with the mail order pharmacy. Patient stated that if she can get the Atenolol from a local pharmacy she is going to call her mail order pharmacy and cancel the new script. Patient will call back with update after she makes her phone calls/

## 2016-06-04 NOTE — Addendum Note (Signed)
Addended by: Tonia Ghent on: 06/04/2016 01:11 PM   Modules accepted: Orders

## 2016-06-05 MED ORDER — ATENOLOL 25 MG PO TABS: 50.0000 mg | ORAL_TABLET | Freq: Two times a day (BID) | ORAL | Status: DC

## 2016-06-05 NOTE — Telephone Encounter (Signed)
Thanks    Med list updated

## 2016-06-05 NOTE — Addendum Note (Signed)
Addended by: Tonia Ghent on: 06/05/2016 05:54 AM   Modules accepted: Orders

## 2016-06-07 DIAGNOSIS — G4733 Obstructive sleep apnea (adult) (pediatric): Secondary | ICD-10-CM | POA: Diagnosis not present

## 2016-06-11 ENCOUNTER — Encounter: Payer: Self-pay | Admitting: Family Medicine

## 2016-06-11 ENCOUNTER — Ambulatory Visit: Payer: PPO | Admitting: Family Medicine

## 2016-06-16 ENCOUNTER — Encounter: Payer: Self-pay | Admitting: Family Medicine

## 2016-06-16 ENCOUNTER — Ambulatory Visit (INDEPENDENT_AMBULATORY_CARE_PROVIDER_SITE_OTHER): Payer: PPO | Admitting: Family Medicine

## 2016-06-16 VITALS — BP 122/84 | HR 92 | Temp 98.8°F | Wt 181.5 lb

## 2016-06-16 DIAGNOSIS — M549 Dorsalgia, unspecified: Secondary | ICD-10-CM

## 2016-06-16 DIAGNOSIS — K589 Irritable bowel syndrome without diarrhea: Secondary | ICD-10-CM | POA: Diagnosis not present

## 2016-06-16 NOTE — Patient Instructions (Addendum)
Rosaria Ferries will call about your referral. Take care.  Glad to see you.  Total your calories, cut 5% and spread it across 3 meals a day.

## 2016-06-16 NOTE — Assessment & Plan Note (Signed)
Likely exacerbated temporarily with too many beans.  D/w pt.  Okay to try gas x.  Needs healthier diet in general, and weight loss, see AVS, d/w pt.

## 2016-06-16 NOTE — Progress Notes (Signed)
Pre visit review using our clinic review tool, if applicable. No additional management support is needed unless otherwise documented below in the visit note. 

## 2016-06-16 NOTE — Assessment & Plan Note (Signed)
She has been told she has spinal stenosis in the years past, dx per other docs.  Now with pain on standing, better with sitting.  D/w pt about PT. No weakness in BLE.  Reasonable to try PT again.  Needs weight loss, see AVS.

## 2016-06-16 NOTE — Progress Notes (Signed)
She ate a can of beans last week and subsequently she had abd pain, R sided.  A lot of gas in the meantime, then diarrhea/loose stools.  Abd pain finally got better in a few days.  Her nephew died of pancreatic cancer and she was worried.  "I feel great today (re: her abd pain)."    R hip pain.  In the AM, some better at the days goes on, worse after she gets up after sitting again.  She has been told she has spinal stenosis in the years past, dx per other docs.  D/w pt about PT.  She has more R than L back and leg pain.    Her back pain improved with PT after prev L knee surgery.    Prev node excision on the L side of neck per Dr. Rise Patience.  Now with fatty changes on the back of the neck.    Meds, vitals, and allergies reviewed.   ROS: Per HPI unless specifically indicated in ROS section   GEN: nad, alert and oriented HEENT: mucous membranes moist NECK: supple w/o LA, fatty tissue in SQ on the back of the neck but not a discrete lipoma CV: rrr. PULM: ctab, no inc wob ABD: soft, +bs, not ttp EXT: no edema SKIN: no acute rash SLR neg B No midline back pain.

## 2016-06-24 DIAGNOSIS — Z96652 Presence of left artificial knee joint: Secondary | ICD-10-CM | POA: Diagnosis not present

## 2016-07-04 DIAGNOSIS — G8929 Other chronic pain: Secondary | ICD-10-CM | POA: Diagnosis not present

## 2016-07-04 DIAGNOSIS — M545 Low back pain: Secondary | ICD-10-CM | POA: Diagnosis not present

## 2016-07-08 DIAGNOSIS — G8929 Other chronic pain: Secondary | ICD-10-CM | POA: Diagnosis not present

## 2016-07-08 DIAGNOSIS — M545 Low back pain: Secondary | ICD-10-CM | POA: Diagnosis not present

## 2016-07-11 DIAGNOSIS — G8929 Other chronic pain: Secondary | ICD-10-CM | POA: Diagnosis not present

## 2016-07-11 DIAGNOSIS — M545 Low back pain: Secondary | ICD-10-CM | POA: Diagnosis not present

## 2016-07-15 DIAGNOSIS — M545 Low back pain: Secondary | ICD-10-CM | POA: Diagnosis not present

## 2016-07-15 DIAGNOSIS — G8929 Other chronic pain: Secondary | ICD-10-CM | POA: Diagnosis not present

## 2016-07-18 DIAGNOSIS — G8929 Other chronic pain: Secondary | ICD-10-CM | POA: Diagnosis not present

## 2016-07-18 DIAGNOSIS — M545 Low back pain: Secondary | ICD-10-CM | POA: Diagnosis not present

## 2016-07-21 DIAGNOSIS — M545 Low back pain: Secondary | ICD-10-CM | POA: Diagnosis not present

## 2016-07-21 DIAGNOSIS — G8929 Other chronic pain: Secondary | ICD-10-CM | POA: Diagnosis not present

## 2016-07-24 ENCOUNTER — Telehealth: Payer: Self-pay | Admitting: Family Medicine

## 2016-07-24 NOTE — Telephone Encounter (Signed)
LVM for pt to call back and schedule AWV + labs with Lesia and CPE with PCP. °

## 2016-07-25 DIAGNOSIS — M545 Low back pain: Secondary | ICD-10-CM | POA: Diagnosis not present

## 2016-07-25 DIAGNOSIS — G8929 Other chronic pain: Secondary | ICD-10-CM | POA: Diagnosis not present

## 2016-07-29 DIAGNOSIS — G8929 Other chronic pain: Secondary | ICD-10-CM | POA: Diagnosis not present

## 2016-07-29 DIAGNOSIS — M545 Low back pain: Secondary | ICD-10-CM | POA: Diagnosis not present

## 2016-07-31 ENCOUNTER — Telehealth: Payer: Self-pay

## 2016-07-31 DIAGNOSIS — I10 Essential (primary) hypertension: Secondary | ICD-10-CM

## 2016-07-31 NOTE — Telephone Encounter (Signed)
Pt said the atenolol 25 mg is such a small pill that pt has found the atenolol 50 mg taking 1 tab [facility] at [facility]. Pt has annual exam on 08/21/16 but does not want to wait until then to discuss change. Pt said getting at local pharmacy will not cost pt anymore than getting at mail order. Pt only wants 30 day supply at this time.Please advise.

## 2016-07-31 NOTE — Telephone Encounter (Signed)
Patient returned Allison's call.  Patient scheduled AWV and CPE.

## 2016-08-01 DIAGNOSIS — G8929 Other chronic pain: Secondary | ICD-10-CM | POA: Diagnosis not present

## 2016-08-01 DIAGNOSIS — M545 Low back pain: Secondary | ICD-10-CM | POA: Diagnosis not present

## 2016-08-01 MED ORDER — ATENOLOL 50 MG PO TABS: 50.0000 mg | ORAL_TABLET | Freq: Two times a day (BID) | ORAL | 0 refills | Status: DC

## 2016-08-01 NOTE — Telephone Encounter (Signed)
Sent. Thanks.   

## 2016-08-05 DIAGNOSIS — M545 Low back pain: Secondary | ICD-10-CM | POA: Diagnosis not present

## 2016-08-05 DIAGNOSIS — G8929 Other chronic pain: Secondary | ICD-10-CM | POA: Diagnosis not present

## 2016-08-08 DIAGNOSIS — G8929 Other chronic pain: Secondary | ICD-10-CM | POA: Diagnosis not present

## 2016-08-08 DIAGNOSIS — M545 Low back pain: Secondary | ICD-10-CM | POA: Diagnosis not present

## 2016-08-10 ENCOUNTER — Other Ambulatory Visit: Payer: Self-pay | Admitting: Family Medicine

## 2016-08-10 DIAGNOSIS — E7800 Pure hypercholesterolemia, unspecified: Secondary | ICD-10-CM

## 2016-08-14 ENCOUNTER — Ambulatory Visit (INDEPENDENT_AMBULATORY_CARE_PROVIDER_SITE_OTHER): Payer: PPO

## 2016-08-14 VITALS — BP 126/82 | HR 67 | Temp 98.4°F | Ht 59.0 in | Wt 183.2 lb

## 2016-08-14 DIAGNOSIS — E7800 Pure hypercholesterolemia, unspecified: Secondary | ICD-10-CM | POA: Diagnosis not present

## 2016-08-14 DIAGNOSIS — R7309 Other abnormal glucose: Secondary | ICD-10-CM | POA: Diagnosis not present

## 2016-08-14 DIAGNOSIS — Z0000 Encounter for general adult medical examination without abnormal findings: Secondary | ICD-10-CM

## 2016-08-14 LAB — LIPID PANEL
Cholesterol: 188 mg/dL (ref 0–200)
HDL: 37.5 mg/dL — ABNORMAL LOW (ref >39.00)
NonHDL: 150.98
Total CHOL/HDL Ratio: 5
Triglycerides: 228 mg/dL — ABNORMAL HIGH (ref 0.0–149.0)
VLDL: 45.6 mg/dL — ABNORMAL HIGH (ref 0.0–40.0)

## 2016-08-14 LAB — COMPREHENSIVE METABOLIC PANEL
ALT: 15 U/L (ref 0–35)
AST: 21 U/L (ref 0–37)
Albumin: 4.2 g/dL (ref 3.5–5.2)
Alkaline Phosphatase: 80 U/L (ref 39–117)
BUN: 15 mg/dL (ref 6–23)
CO2: 27 mEq/L (ref 19–32)
Calcium: 9.8 mg/dL (ref 8.4–10.5)
Chloride: 106 mEq/L (ref 96–112)
Creatinine, Ser: 0.84 mg/dL (ref 0.40–1.20)
GFR: 70.46 mL/min (ref >60.00)
Glucose, Bld: 88 mg/dL (ref 70–99)
Potassium: 4 mEq/L (ref 3.5–5.1)
Sodium: 140 mEq/L (ref 135–145)
Total Bilirubin: 0.6 mg/dL (ref 0.2–1.2)
Total Protein: 6.9 g/dL (ref 6.0–8.3)

## 2016-08-14 LAB — HEMOGLOBIN A1C: Hgb A1c MFr Bld: 5.9 % (ref 4.6–6.5)

## 2016-08-14 LAB — LDL CHOLESTEROL, DIRECT: Direct LDL: 117 mg/dL

## 2016-08-14 NOTE — Patient Instructions (Signed)
Ms. Larmon , Thank you for taking time to come for your Medicare Wellness Visit. I appreciate your ongoing commitment to your health goals. Please review the following plan we discussed and let me know if I can assist you in the future.   These are the goals we discussed: Goals    . Increase physical activity          Starting 08/14/2016, I will continue exercise for at least 30 min 3 times weekly.        This is a list of the screening recommended for you and due dates:  Health Maintenance  Topic Date Due  . Mammogram  10/25/2017  . Colon Cancer Screening  12/17/2024  . Tetanus Vaccine  01/17/2026  . Flu Shot  Completed  . DEXA scan (bone density measurement)  Completed  . Pneumonia vaccines  Completed   Preventive Care for Adults  A healthy lifestyle and preventive care can promote health and wellness. Preventive health guidelines for adults include the following key practices.  . A routine yearly physical is a good way to check with your health care provider about your health and preventive screening. It is a chance to share any concerns and updates on your health and to receive a thorough exam.  . Visit your dentist for a routine exam and preventive care every 6 months. Brush your teeth twice a day and floss once a day. Good oral hygiene prevents tooth decay and gum disease.  . The frequency of eye exams is based on your age, health, family medical history, use  of contact lenses, and other factors. Follow your health care provider's ecommendations for frequency of eye exams.  . Eat a healthy diet. Foods like vegetables, fruits, whole grains, low-fat dairy products, and lean protein foods contain the nutrients you need without too many calories. Decrease your intake of foods high in solid fats, added sugars, and salt. Eat the right amount of calories for you. Get information about a proper diet from your health care provider, if necessary.  . Regular physical exercise is one of  the most important things you can do for your health. Most adults should get at least 150 minutes of moderate-intensity exercise (any activity that increases your heart rate and causes you to sweat) each week. In addition, most adults need muscle-strengthening exercises on 2 or more days a week.  Silver Sneakers may be a benefit available to you. To determine eligibility, you may visit the website: www.silversneakers.com or contact program at [phone] Mon-Fri between 8AM-8PM.   . Maintain a healthy weight. The body mass index (BMI) is a screening tool to identify possible weight problems. It provides an estimate of body fat based on height and weight. Your health care provider can find your BMI and can help you achieve or maintain a healthy weight.   For adults 20 years and older: ? A BMI below 18.5 is considered underweight. ? A BMI of 18.5 to 24.9 is normal. ? A BMI of 25 to 29.9 is considered overweight. ? A BMI of 30 and above is considered obese.   . Maintain normal blood lipids and cholesterol levels by exercising and minimizing your intake of saturated fat. Eat a balanced diet with plenty of fruit and vegetables. Blood tests for lipids and cholesterol should begin at age 59 and be repeated every 5 years. If your lipid or cholesterol levels are high, you are over 50, or you are at high risk for heart disease, you may  need your cholesterol levels checked more frequently. Ongoing high lipid and cholesterol levels should be treated with medicines if diet and exercise are not working.  . If you smoke, find out from your health care provider how to quit. If you do not use tobacco, please do not start.  . If you choose to drink alcohol, please do not consume more than 2 drinks per day. One drink is considered to be 12 ounces (355 mL) of beer, 5 ounces (148 mL) of wine, or 1.5 ounces (44 mL) of liquor.  . If you are 44-3 years old, ask your health care provider if you should take aspirin to  prevent strokes.  . Use sunscreen. Apply sunscreen liberally and repeatedly throughout the day. You should seek shade when your shadow is shorter than you. Protect yourself by wearing long sleeves, pants, a wide-brimmed hat, and sunglasses year round, whenever you are outdoors.  . Once a month, do a whole body skin exam, using a mirror to look at the skin on your back. Tell your health care provider of new moles, moles that have irregular borders, moles that are larger than a pencil eraser, or moles that have changed in shape or color.

## 2016-08-14 NOTE — Progress Notes (Signed)
Pre visit review using our clinic review tool, if applicable. No additional management support is needed unless otherwise documented below in the visit note. 

## 2016-08-14 NOTE — Progress Notes (Signed)
Subjective:   [name] [name] is a 76 y.o. female who presents for Medicare Annual (Subsequent) preventive examination.  Review of Systems:  N/A Cardiac Risk Factors include: advanced age (>19men, >82 women);obesity (BMI >30kg/m2);dyslipidemia;hypertension     Objective:     Vitals: BP 126/82 (BP Location: Right Arm, Patient Position: Sitting, Cuff Size: Normal)   Pulse 67   Temp 98.4 F (36.9 C) (Oral)   Ht 4\' 11"  (1.499 m) Comment: no shoes  Wt 183 lb 4 oz (83.1 kg)   SpO2 96%   BMI 37.01 kg/m   Body mass index is 37.01 kg/m.   Tobacco History  Smoking Status  . Never Smoker  Smokeless Tobacco  . Never Used     Counseling given: No   Past Medical History:  Diagnosis Date  . Fatty liver   . GERD (gastroesophageal reflux disease)   . Hyperlipidemia   . Hypertension   . IBS (irritable bowel syndrome)   . Obesity   . Sleep apnea    uses CPAP   Past Surgical History:  Procedure Laterality Date  . BLADDER SURGERY    . BREAST SURGERY     breast byopsy  . CATARACT EXTRACTION W/PHACO Left 02/05/2016   Procedure: CATARACT EXTRACTION PHACO AND INTRAOCULAR LENS PLACEMENT (IOC);  Surgeon: Birder Robson, MD;  Location: [facility] ORS;  Service: Ophthalmology;  Laterality: Left;  [place] 01:10 AP% 22.3 CDE 15.67 Fluid pack lot # [id number]  . CATARACT EXTRACTION W/PHACO Right 02/26/2016   Procedure: CATARACT EXTRACTION PHACO AND INTRAOCULAR LENS PLACEMENT (IOC);  Surgeon: Birder Robson, MD;  Location: [facility] ORS;  Service: Ophthalmology;  Laterality: Right;  [place] 57.4 AP% 24.0 CDE 13.75 Fluid Pack lot # [id number]  . CHOLECYSTECTOMY    . COLONOSCOPY WITH PROPOFOL N/A 12/18/2014   Procedure: COLONOSCOPY WITH PROPOFOL;  Surgeon: Manya Silvas, MD;  Location: [facility] ENDOSCOPY;  Service: Endoscopy;  Laterality: N/A;  . DEEP NECK LYMPH NODE BIOPSY / EXCISION    . JOINT REPLACEMENT    . KNEE ARTHROPLASTY Left 06/20/2015   Procedure: COMPUTER ASSISTED TOTAL KNEE ARTHROPLASTY;   Surgeon: Dereck Leep, MD;  Location: [facility] ORS;  Service: Orthopedics;  Laterality: Left;  . KNEE SURGERY    . TONSILLECTOMY     Family History  Problem Relation Age of Onset  . Heart attack Mother   . Dementia Father   . Aortic aneurysm Brother   . Colon cancer Neg Hx   . Breast cancer Neg Hx    History  Sexual Activity  . Sexual activity: No    Outpatient Encounter Prescriptions as of 08/14/2016  Medication Sig  . aspirin EC 81 MG tablet Take 81 mg by mouth 2 (two) times daily.  [name] Kitchen atenolol (TENORMIN) 50 MG tablet Take 1 tablet (50 mg total) by mouth 2 (two) times daily.  . Biotin 10000 MCG TABS Take 10,000 mcg by mouth daily.  . calcium carbonate (TUMS EX) 750 MG chewable tablet Chew 1 tablet by mouth at bedtime as needed for heartburn.  . cetirizine (ZYRTEC) 10 MG tablet Take 10 mg by mouth as needed.   . cholecalciferol (VITAMIN D) 1000 UNITS tablet Take 1,000 Units by mouth daily. In am  . clobetasol ointment (TEMOVATE) 1.91 % Apply 1 application topically daily as needed (for itching).  . Glucosamine-Chondroit-Vit C-Mn (GLUCOSAMINE CHONDR 1500 COMPLX PO) Take 1 tablet by mouth 2 (two) times daily.  . Magnesium (M2 MAGNESIUM) 100 MG CAPS Take 500 mg by mouth every morning.   [name] Kitchen  naproxen sodium (ANAPROX) 220 MG tablet Take 220 mg by mouth as needed.  . NON FORMULARY Vital Reds (Polyphenol Blend) one scoop daily in water.  [name] Kitchen omeprazole (PRILOSEC) 20 MG capsule Take 20 mg by mouth as needed.  . Probiotic Product (PROBIOTIC DAILY PO) Take by mouth daily.  . ranitidine (ZANTAC) 300 MG tablet Take 1 tablet (300 mg total) by mouth at bedtime. Reported on 09/21/2015  . sertraline (ZOLOFT) 50 MG tablet Take 1 tablet (50 mg total) by mouth daily. (Patient taking differently: Take 50 mg by mouth 3 (three) times a week. )  . simvastatin (ZOCOR) 40 MG tablet Take 40 mg by mouth 3 (three) times a week.    No facility-administered encounter medications on file as of 08/14/2016.      Activities of Daily Living In your present state of health, do you have any difficulty performing the following activities: 08/14/2016  Hearing? N  Vision? N  Difficulty concentrating or making decisions? N  Walking or climbing stairs? Y  Dressing or bathing? N  Doing errands, shopping? N  Preparing Food and eating ? N  Using the Toilet? N  In the past six months, have you accidently leaked urine? Y  Do you have problems with loss of bowel control? N  Managing your Medications? N  Managing your Finances? N  Housekeeping or managing your Housekeeping? N  Some recent data might be hidden    Patient Care Team: Tonia Ghent, MD as PCP - General (Family Medicine) Birder Robson, MD as Referring Physician (Ophthalmology) Dereck Leep, MD as Consulting Physician (Orthopedic Surgery)    Assessment:     Hearing Screening   125Hz  250Hz  500Hz  1000Hz  2000Hz  3000Hz  4000Hz  6000Hz  8000Hz   Right ear:   40 40 40  0    Left ear:   40 40 40  40    Vision Screening Comments: Last vision exam in 2018 with Dr. George Ina   Exercise Activities and Dietary recommendations Current Exercise Habits: Home exercise routine, Type of exercise: Other - see comments;stretching (recumbent bike), Time (Minutes): 30, Frequency (Times/Week): 3, Weekly Exercise (Minutes/Week): 90, Intensity: Mild, Exercise limited by: orthopedic condition(s)  Goals    . Increase physical activity          Starting 08/14/2016, I will continue exercise for at least 30 min 3 times weekly.       Fall Risk Fall Risk  08/14/2016 05/07/2015 11/29/2014  Falls in the past year? No No No   Depression Screen PHQ 2/9 Scores 08/14/2016 05/07/2015 11/29/2014  PHQ - 2 Score 0 0 -  Exception Documentation - - Medical reason     Cognitive Function MMSE - Mini Mental State Exam 08/14/2016  Orientation to time 5  Orientation to Place 5  Registration 3  Attention/ Calculation 0  Recall 3  Language- name 2 objects 0  Language-  repeat 1  Language- follow 3 step command 3  Language- read & follow direction 0  Write a sentence 0  Copy design 0  Total score 20       PLEASE NOTE: A Mini-Cog screen was completed. Maximum score is 20. A value of 0 denotes this part of Folstein MMSE was not completed or the patient failed this part of the Mini-Cog screening.   Mini-Cog Screening Orientation to Time - Max 5 pts Orientation to Place - Max 5 pts Registration - Max 3 pts Recall - Max 3 pts Language Repeat - Max 1 pts Language Follow 3 Step  Command - Max 3 pts   Immunization History  Administered Date(s) Administered  . Influenza Whole 05/09/2004  . Influenza, Seasonal, Injecte, Preservative Fre 03/09/2016  . Influenza,inj,Quad PF,36+ Mos 02/06/2014  . Influenza-Unspecified 01/24/2014  . Pneumococcal Conjugate-13 04/25/2014  . Pneumococcal Polysaccharide-23 03/30/2007  . Pneumococcal-Unspecified 09/17/2007  . Td 10/24/2005  . Tdap 01/18/2016  . Zoster 05/26/2008   Screening Tests Health Maintenance  Topic Date Due  . MAMMOGRAM  10/25/2017  . COLONOSCOPY  12/17/2024  . TETANUS/TDAP  01/17/2026  . INFLUENZA VACCINE  Completed  . DEXA SCAN  Completed  . PNA vac Low Risk Adult  Completed      Plan:     I have personally reviewed and addressed the Medicare Annual Wellness questionnaire and have noted the following in the patient's chart:  A. Medical and social history B. Use of alcohol, tobacco or illicit drugs  C. Current medications and supplements D. Functional ability and status E.  Nutritional status F.  Physical activity G. Advance directives H. List of other physicians I.  Hospitalizations, surgeries, and ER visits in previous 12 months J.  [place] to include hearing, vision, cognitive, depression L. Referrals and appointments - none  In addition, I have reviewed and discussed with patient certain preventive protocols, quality metrics, and best practice recommendations. A  written personalized care plan for preventive services as well as general preventive health recommendations were provided to patient.  See attached scanned questionnaire for additional information.   Signed,   Lindell Noe, MHA, BS, LPN Health Coach

## 2016-08-14 NOTE — Progress Notes (Signed)
PCP notes:   Health maintenance:  No gaps identified.   Abnormal screenings:   Hearing - failed  Patient concerns:   Patient has complaint of back pain. Pain scale: 2/10.   Nurse concerns:  None  Next PCP appt:   08/21/16 @ 0945  I reviewed health advisor's note, was available for consultation on the day of service listed in this note, and agree with documentation and plan. Elsie Stain, MD.

## 2016-08-21 ENCOUNTER — Ambulatory Visit (INDEPENDENT_AMBULATORY_CARE_PROVIDER_SITE_OTHER): Payer: PPO | Admitting: Family Medicine

## 2016-08-21 ENCOUNTER — Encounter: Payer: Self-pay | Admitting: Family Medicine

## 2016-08-21 DIAGNOSIS — E7800 Pure hypercholesterolemia, unspecified: Secondary | ICD-10-CM | POA: Diagnosis not present

## 2016-08-21 DIAGNOSIS — Z7189 Other specified counseling: Secondary | ICD-10-CM | POA: Diagnosis not present

## 2016-08-21 DIAGNOSIS — F329 Major depressive disorder, single episode, unspecified: Secondary | ICD-10-CM | POA: Diagnosis not present

## 2016-08-21 DIAGNOSIS — F411 Generalized anxiety disorder: Secondary | ICD-10-CM

## 2016-08-21 DIAGNOSIS — F32A Depression, unspecified: Secondary | ICD-10-CM

## 2016-08-21 DIAGNOSIS — I10 Essential (primary) hypertension: Secondary | ICD-10-CM

## 2016-08-21 MED ORDER — RANITIDINE HCL 300 MG PO TABS: 300.0000 mg | ORAL_TABLET | Freq: Every day | ORAL | 3 refills | Status: DC

## 2016-08-21 MED ORDER — ATENOLOL 50 MG PO TABS: 50.0000 mg | ORAL_TABLET | Freq: Two times a day (BID) | ORAL | 3 refills | Status: DC

## 2016-08-21 MED ORDER — SIMVASTATIN 40 MG PO TABS: 40.0000 mg | ORAL_TABLET | ORAL | 3 refills | Status: DC

## 2016-08-21 MED ORDER — SERTRALINE HCL 50 MG PO TABS: 50.0000 mg | ORAL_TABLET | Freq: Every day | ORAL | 3 refills | Status: DC

## 2016-08-21 NOTE — Patient Instructions (Signed)
Restart the sertraline.  It's okay to be your own advocate.  Take care.  Glad to see you.

## 2016-08-21 NOTE — Progress Notes (Signed)
She is busy with home and church work.  D/w pt.  She feels covered up/overwhelmed with all of her responsibilities.  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  She is off SSRI in the meantime.  She weaned down then stopped.  D/w pt.  It is likely reasonable to restart SSRI, she agrees.    Hearing - failed.  Declined hearing aids.  D/w pt.    Patient had complained of back pain. Pain scale: 2/10.  It is clearly getting better.  She has more pain when getting up in the AM and if on her feet for prolonged period.  She has home exercises to do.  She is stretching.  D/w pt.   Hypertension:    Using medication without problems or lightheadedness: yes Chest pain with exertion:no Edema:no Short of breath:no  Elevated Cholesterol: Using medications without problems:yes Muscle aches: no Diet compliance:encouraged Exercise:encouraged She is able to tolerate simvastatin at 3x/week.   Labs d/w pt.   Daughter [name] designated if patient were incapacitated.   PMH and SH reviewed  ROS: Per HPI unless specifically indicated in ROS section   Meds, vitals, and allergies reviewed.   GEN: nad, alert and oriented HEENT: mucous membranes moist NECK: supple w/o LA CV: rrr.  no murmur PULM: ctab, no inc wob ABD: soft, +bs EXT: no edema SKIN: no acute rash

## 2016-08-21 NOTE — Progress Notes (Signed)
Pre visit review using our clinic review tool, if applicable. No additional management support is needed unless otherwise documented below in the visit note. 

## 2016-08-24 NOTE — Assessment & Plan Note (Signed)
Okay for outpatient f/u.  She feels overwhelmed.  Restart/continue SSRI at this point.  dw pt about the fact that it is okay to taper her commitments to a manageable level.  She agrees.

## 2016-08-24 NOTE — Assessment & Plan Note (Signed)
D/w pt about diet and exercise.   

## 2016-08-24 NOTE — Assessment & Plan Note (Addendum)
Controlled, continue current med.  No ADE on med.  D/w pt about diet and exercise.   >25 minutes spent in face to face time with patient, >50% spent in counselling or coordination of care.

## 2016-08-24 NOTE — Assessment & Plan Note (Signed)
Reasonably controlled, continue current med at current dose, this is likely the max dose for patient.  No ADE on med.  D/w pt about diet and exercise.

## 2016-08-24 NOTE — Assessment & Plan Note (Signed)
Daughter [name] designated if patient were incapacitated.

## 2016-09-03 ENCOUNTER — Telehealth: Payer: Self-pay | Admitting: *Deleted

## 2016-09-03 MED ORDER — SIMVASTATIN 20 MG PO TABS: 20.0000 mg | ORAL_TABLET | ORAL | 3 refills | Status: DC

## 2016-09-03 NOTE — Telephone Encounter (Signed)
Pt left vm stating she had wellness 3/29 and was given zocor 40mg , written Rx. She only takes it 3x/wk as advised per Dr Damita Dunnings due to her joints. Pt reports she was previously on 20mg  and wanted to confirm that she was to increase medication before having Rx filled. Call back number not provided. pls advise

## 2016-09-03 NOTE — Telephone Encounter (Signed)
Patient notified as instructed by telephone and verbalized understanding. 

## 2016-09-03 NOTE — Telephone Encounter (Signed)
When we talked, I thought she was taking 40mg  3 times a week.   If she was on 20mg  3 times a week, then I would continue that.  rx resent as 20mg .  Thanks.

## 2016-10-14 DIAGNOSIS — H04123 Dry eye syndrome of bilateral lacrimal glands: Secondary | ICD-10-CM | POA: Diagnosis not present

## 2016-10-29 DIAGNOSIS — Z1231 Encounter for screening mammogram for malignant neoplasm of breast: Secondary | ICD-10-CM | POA: Diagnosis not present

## 2016-10-29 DIAGNOSIS — Z01419 Encounter for gynecological examination (general) (routine) without abnormal findings: Secondary | ICD-10-CM | POA: Diagnosis not present

## 2016-10-29 DIAGNOSIS — Z6837 Body mass index (BMI) 37.0-37.9, adult: Secondary | ICD-10-CM | POA: Diagnosis not present

## 2016-11-20 DIAGNOSIS — L400 Psoriasis vulgaris: Secondary | ICD-10-CM | POA: Diagnosis not present

## 2016-11-20 DIAGNOSIS — L821 Other seborrheic keratosis: Secondary | ICD-10-CM | POA: Diagnosis not present

## 2016-11-20 DIAGNOSIS — L739 Follicular disorder, unspecified: Secondary | ICD-10-CM | POA: Diagnosis not present

## 2016-11-20 DIAGNOSIS — G4733 Obstructive sleep apnea (adult) (pediatric): Secondary | ICD-10-CM | POA: Diagnosis not present

## 2016-11-20 DIAGNOSIS — K130 Diseases of lips: Secondary | ICD-10-CM | POA: Diagnosis not present

## 2016-11-20 DIAGNOSIS — Z1283 Encounter for screening for malignant neoplasm of skin: Secondary | ICD-10-CM | POA: Diagnosis not present

## 2016-12-23 ENCOUNTER — Other Ambulatory Visit: Payer: Self-pay | Admitting: *Deleted

## 2016-12-23 MED ORDER — RANITIDINE HCL 300 MG PO TABS: 300.0000 mg | ORAL_TABLET | Freq: Every day | ORAL | 3 refills | Status: DC

## 2016-12-23 NOTE — Telephone Encounter (Signed)
Noted.  We can send in if she can't find the hard copy.  Thanks.

## 2016-12-23 NOTE — Telephone Encounter (Signed)
Patient called requesting that a refill be sent to [facility] for Ranitidine. Advised patient that she was given a written script at her office visit in March. Patient stated that she didn't want to have to go thru her paperwork. Advised patient that when a patient is given a written script usually the patient will mail it in or take to the pharmacy of her choice. Patient said to forget about sending it into [facility] and just send it to [facility]. Advised patient that since she has the written script she should be able to get it filled locally if that is her choice. Patient hung up the phone.

## 2017-01-23 DIAGNOSIS — H02403 Unspecified ptosis of bilateral eyelids: Secondary | ICD-10-CM | POA: Diagnosis not present

## 2017-02-19 DIAGNOSIS — H02831 Dermatochalasis of right upper eyelid: Secondary | ICD-10-CM | POA: Diagnosis not present

## 2017-02-19 DIAGNOSIS — H02834 Dermatochalasis of left upper eyelid: Secondary | ICD-10-CM | POA: Diagnosis not present

## 2017-02-20 DIAGNOSIS — G4733 Obstructive sleep apnea (adult) (pediatric): Secondary | ICD-10-CM | POA: Diagnosis not present

## 2017-03-09 ENCOUNTER — Encounter: Payer: Self-pay | Admitting: Family Medicine

## 2017-03-09 ENCOUNTER — Ambulatory Visit (INDEPENDENT_AMBULATORY_CARE_PROVIDER_SITE_OTHER): Payer: PPO | Admitting: Family Medicine

## 2017-03-09 VITALS — BP 136/80 | HR 72 | Temp 98.4°F | Wt 190.8 lb

## 2017-03-09 DIAGNOSIS — R300 Dysuria: Secondary | ICD-10-CM

## 2017-03-09 DIAGNOSIS — R3915 Urgency of urination: Secondary | ICD-10-CM

## 2017-03-09 LAB — POC URINALSYSI DIPSTICK (AUTOMATED)
Bilirubin, UA: NEGATIVE
Blood, UA: NEGATIVE
Glucose, UA: NEGATIVE
Ketones, UA: NEGATIVE
Nitrite, UA: NEGATIVE
Protein, UA: NEGATIVE
Spec Grav, UA: 1.02 (ref 1.010–1.025)
Urobilinogen, UA: 0.2 E.U./dL
pH, UA: 6 (ref 5.0–8.0)

## 2017-03-09 MED ORDER — SULFAMETHOXAZOLE-TRIMETHOPRIM 400-80 MG PO TABS: 1.0000 | ORAL_TABLET | Freq: Two times a day (BID) | ORAL | 0 refills | Status: DC

## 2017-03-09 NOTE — Patient Instructions (Addendum)
Drink plenty of water and start the antibiotics if you get worse.  We'll contact you with your lab report.  Take care.

## 2017-03-09 NOTE — Progress Notes (Signed)
She didn't feel normal after voiding and had to double void.  some urgency.  Sx ongoing for a month.  She has lower abd pain, L>R side.  No fevers.  No vomiting.  No blood in urine.    She has B hip pain, worse in the AM. She has f/u with Dr. Maureen Ralphs pending.  She has gone to PT.    Meds, vitals, and allergies reviewed.   ROS: Per HPI unless specifically indicated in ROS section   GEN: nad, alert and oriented HEENT: mucous membranes moist NECK: supple CV: rrr.  PULM: ctab, no inc wob ABD: soft, +bs, suprapubic area slightly tender EXT: no edema BACK: no CVA pain

## 2017-03-10 DIAGNOSIS — R300 Dysuria: Secondary | ICD-10-CM | POA: Insufficient documentation

## 2017-03-10 NOTE — Assessment & Plan Note (Signed)
Possible cystitis.  Nontoxic.  Drink plenty of water and start septra if worse in the meantime.  Await urine culture results in the meantime. She agrees.

## 2017-03-11 ENCOUNTER — Encounter: Payer: Self-pay | Admitting: Family Medicine

## 2017-03-11 LAB — URINE CULTURE
MICRO NUMBER:: 81146707
SPECIMEN QUALITY:: ADEQUATE

## 2017-04-08 ENCOUNTER — Encounter: Payer: Self-pay | Admitting: *Deleted

## 2017-04-10 NOTE — Discharge Instructions (Signed)
INSTRUCTIONS FOLLOWING OCULOPLASTIC SURGERY °[name] M. FOWLER, MD ° °AFTER YOUR EYE SURGERY, THER ARE MANY THINGS THWIHC YOU, THE PATIENT, CAN DO TO ASSURE THE BEST POSSIBLE RESULT FROM YOUR OPERATION.  THIS SHEET SHOULD BE REFERRED TO WHENEVER QUESTIONS ARISE.  IF THERE ARE ANY QUESTIONS NOT ANSWERED HERE, DO NOT HESITATE TO CALL OUR OFFICE AT [phone] OR [phone].  THERE IS ALWAYS [facility] AVAILABLE TO CALL IF QUESTIONS OR PROBLEMS ARISE. ° °VISION: Your vision may be blurred and out of focus after surgery until you are able to stop using your ointment, swelling resolves and your eye(s) heal. This may take 1 to 2 weeks at the least.  If your vision becomes gradually more dim or dark, this is not normal and you need to call our office immediately. ° °EYE CARE: For the first 48 hours after surgery, use ice packs frequently - “20 minutes on, 20 minutes off” - to help reduce swelling and bruising.  Small bags of frozen peas or corn make good ice packs along with cloths soaked in ice water.  If you are wearing a patch or other type of dressing following surgery, keep this on for the amount of time specified by your doctor.  For the first week following surgery, you will need to treat your stitches with great care.  If is OK to shower, but take care to not allow soapy water to run into your eye(s) to help reduce changes of infection.  You may gently clean the eyelashes and around the eye(s) with cotton balls and sterile water, BUT DO NOT RUB THE STITCHES VIGOROUSLY.  Keeping your stitches moist with ointment will help promote healing with minimal scar formation. ° °ACTIVITY: When you leave the surgery center, you should go home, rest and be inactive.  The eye(s) may feel scratchy and keeping the eyes closed will allow for faster healing.  The first week following surgery, avoid straining (anything making the face turn red) or lifting over 20 pounds.  Additionally, avoid bending which causes your head to go below  your waist.  Using your eyes will NOT harm them, so feel free to read, watch television, use the computer, etc as desired.  Driving depends on each individual, so check with your doctor if you have questions about driving. ° °MEDICATIONS:  You will be given a prescription for an ointment to use 4 times a day on your stitches.  You can use the ointment in your eyes if they feel scratchy or irritated.  If you eyelid(s) don’t close completely when you sleep, put some ointment in your eyes before bedtime. ° °EMERGENCY: If you experience SEVERE EYE PAIN OR HEADACHE UNRELIEVED BY TYLENOL OR PERCOCET, NAUSEA OR VOMITING, WORSENING REDNESS, OR WORSENING VISION (ESPECIALLY VISION THAT WA INITIALLY BETTER) CALL [phone] OR [phone] DURING BUSINESS HOURS OR AFTER HOURS. ° °General Anesthesia, Adult, Care After °These instructions provide you with information about caring for yourself after your procedure. Your health care provider may also give you more specific instructions. Your treatment has been planned according to current medical practices, but problems sometimes occur. Call your health care provider if you have any problems or questions after your procedure. °What can I expect after the procedure? °After the procedure, it is common to have: °· Vomiting. °· A sore throat. °· Mental slowness. ° °It is common to feel: °· Nauseous. °· Cold or shivery. °· Sleepy. °· Tired. °· Sore or achy, even in parts of your body where you did not have surgery. ° °  Follow these instructions at home: °For at least 24 hours after the procedure: °· Do not: °? Participate in activities where you could fall or become injured. °? Drive. °? Use heavy machinery. °? Drink alcohol. °? Take sleeping pills or medicines that cause drowsiness. °? Make important decisions or sign legal documents. °? Take care of children on your own. °· Rest. °Eating and drinking °· If you vomit, drink water, juice, or soup when you can drink without  vomiting. °· Drink enough fluid to keep your urine clear or pale yellow. °· Make sure you have little or no nausea before eating solid foods. °· Follow the diet recommended by your health care provider. °General instructions °· Have a responsible adult stay with you until you are awake and alert. °· Return to your normal activities as told by your health care provider. Ask your health care provider what activities are safe for you. °· Take over-the-counter and prescription medicines only as told by your health care provider. °· If you smoke, do not smoke without supervision. °· Keep all follow-up visits as told by your health care provider. This is important. °Contact a health care provider if: °· You continue to have nausea or vomiting at home, and medicines are not helpful. °· You cannot drink fluids or start eating again. °· You cannot urinate after 8-12 hours. °· You develop a skin rash. °· You have fever. °· You have increasing redness at the site of your procedure. °Get help right away if: °· You have difficulty breathing. °· You have chest pain. °· You have unexpected bleeding. °· You feel that you are having a life-threatening or urgent problem. °This information is not intended to replace advice given to you by your health care provider. Make sure you discuss any questions you have with your health care provider. °Document Released: 08/18/2000 Document Revised: 10/15/2015 Document Reviewed: 04/26/2015 °Elsevier Interactive Patient Education © 2018 [employer] ° °

## 2017-04-14 ENCOUNTER — Ambulatory Visit: Payer: PPO | Admitting: Anesthesiology

## 2017-04-14 ENCOUNTER — Encounter
Admission: RE | Disposition: A | Discharge status: Home / Self Care | Payer: Self-pay | Source: Ambulatory Visit | Attending: Ophthalmology

## 2017-04-14 ENCOUNTER — Ambulatory Visit
Admission: RE | Admit: 2017-04-14 | Discharge: 2017-04-14 | Disposition: A | Discharge status: Home / Self Care | Payer: PPO | Source: Ambulatory Visit | Attending: Ophthalmology | Admitting: Ophthalmology

## 2017-04-14 DIAGNOSIS — K449 Diaphragmatic hernia without obstruction or gangrene: Secondary | ICD-10-CM | POA: Insufficient documentation

## 2017-04-14 DIAGNOSIS — H02834 Dermatochalasis of left upper eyelid: Secondary | ICD-10-CM | POA: Insufficient documentation

## 2017-04-14 DIAGNOSIS — K760 Fatty (change of) liver, not elsewhere classified: Secondary | ICD-10-CM | POA: Diagnosis not present

## 2017-04-14 DIAGNOSIS — Z9849 Cataract extraction status, unspecified eye: Secondary | ICD-10-CM | POA: Diagnosis not present

## 2017-04-14 DIAGNOSIS — Z96652 Presence of left artificial knee joint: Secondary | ICD-10-CM | POA: Diagnosis not present

## 2017-04-14 DIAGNOSIS — F418 Other specified anxiety disorders: Secondary | ICD-10-CM | POA: Insufficient documentation

## 2017-04-14 DIAGNOSIS — K589 Irritable bowel syndrome without diarrhea: Secondary | ICD-10-CM | POA: Diagnosis not present

## 2017-04-14 DIAGNOSIS — G4730 Sleep apnea, unspecified: Secondary | ICD-10-CM | POA: Diagnosis not present

## 2017-04-14 DIAGNOSIS — K219 Gastro-esophageal reflux disease without esophagitis: Secondary | ICD-10-CM | POA: Diagnosis not present

## 2017-04-14 DIAGNOSIS — H02403 Unspecified ptosis of bilateral eyelids: Secondary | ICD-10-CM | POA: Diagnosis not present

## 2017-04-14 DIAGNOSIS — H02831 Dermatochalasis of right upper eyelid: Secondary | ICD-10-CM | POA: Diagnosis not present

## 2017-04-14 DIAGNOSIS — H02401 Unspecified ptosis of right eyelid: Secondary | ICD-10-CM | POA: Diagnosis not present

## 2017-04-14 DIAGNOSIS — Z9049 Acquired absence of other specified parts of digestive tract: Secondary | ICD-10-CM | POA: Insufficient documentation

## 2017-04-14 DIAGNOSIS — E7800 Pure hypercholesterolemia, unspecified: Secondary | ICD-10-CM | POA: Insufficient documentation

## 2017-04-14 DIAGNOSIS — I10 Essential (primary) hypertension: Secondary | ICD-10-CM | POA: Insufficient documentation

## 2017-04-14 DIAGNOSIS — R51 Headache: Secondary | ICD-10-CM | POA: Insufficient documentation

## 2017-04-14 DIAGNOSIS — Z6838 Body mass index (BMI) 38.0-38.9, adult: Secondary | ICD-10-CM | POA: Diagnosis not present

## 2017-04-14 DIAGNOSIS — M1990 Unspecified osteoarthritis, unspecified site: Secondary | ICD-10-CM | POA: Diagnosis not present

## 2017-04-14 DIAGNOSIS — H02402 Unspecified ptosis of left eyelid: Secondary | ICD-10-CM | POA: Diagnosis not present

## 2017-04-14 HISTORY — DX: Unspecified osteoarthritis, unspecified site: M19.90

## 2017-04-14 HISTORY — PX: PTOSIS REPAIR: SHX6568

## 2017-04-14 HISTORY — PX: BROW LIFT: SHX178

## 2017-04-14 SURGERY — BLEPHAROPLASTY
Anesthesia: General | Laterality: Bilateral | Wound class: Clean

## 2017-04-14 MED ORDER — PROPOFOL 500 MG/50ML IV EMUL
INTRAVENOUS | Status: DC | PRN
  Administered 2017-04-14: 25 ug/kg/min via INTRAVENOUS

## 2017-04-14 MED ORDER — OXYCODONE HCL 5 MG/5ML PO SOLN
5.0000 mg | Freq: Once | ORAL | Status: DC | PRN
Start: 2017-04-14 — End: 2017-04-14

## 2017-04-14 MED ORDER — LIDOCAINE-EPINEPHRINE 2 %-1:100000 IJ SOLN
INTRAMUSCULAR | Status: DC | PRN
  Administered 2017-04-14: 1 mL via OPHTHALMIC

## 2017-04-14 MED ORDER — LACTATED RINGERS IV SOLN
1000.0000 mL | INTRAVENOUS | Status: DC
  Administered 2017-04-14: 1000 mL via INTRAVENOUS

## 2017-04-14 MED ORDER — ERYTHROMYCIN 5 MG/GM OP OINT
TOPICAL_OINTMENT | OPHTHALMIC | Status: DC | PRN
  Administered 2017-04-14: 1 via OPHTHALMIC

## 2017-04-14 MED ORDER — LIDOCAINE HCL (CARDIAC) 20 MG/ML IV SOLN
INTRAVENOUS | Status: DC | PRN
  Administered 2017-04-14: 50 mg via INTRAVENOUS

## 2017-04-14 MED ORDER — BSS IO SOLN
INTRAOCULAR | Status: DC | PRN
  Administered 2017-04-14: 30 mL

## 2017-04-14 MED ORDER — FENTANYL CITRATE (PF) 100 MCG/2ML IJ SOLN: 25.0000 ug | INTRAMUSCULAR | Status: DC | PRN

## 2017-04-14 MED ORDER — ERYTHROMYCIN 5 MG/GM OP OINT: TOPICAL_OINTMENT | OPHTHALMIC | 3 refills | Status: DC

## 2017-04-14 MED ORDER — ALFENTANIL 500 MCG/ML IJ INJ
INJECTION | INTRAVENOUS | Status: DC | PRN
Start: 2017-04-14 — End: 2017-04-14
  Administered 2017-04-14: 1000 ug via INTRAVENOUS

## 2017-04-14 MED ORDER — OXYCODONE HCL 5 MG PO TABS: 5.0000 mg | ORAL_TABLET | Freq: Once | ORAL | Status: DC | PRN

## 2017-04-14 MED ORDER — TETRACAINE HCL 0.5 % OP SOLN
OPHTHALMIC | Status: DC | PRN
  Administered 2017-04-14: 2 [drp] via OPHTHALMIC

## 2017-04-14 MED ORDER — TRAMADOL HCL 50 MG PO TABS: ORAL_TABLET | ORAL | 0 refills | Status: DC

## 2017-04-14 MED ORDER — MIDAZOLAM HCL 2 MG/2ML IJ SOLN
INTRAMUSCULAR | Status: DC | PRN
  Administered 2017-04-14: 2 mg via INTRAVENOUS

## 2017-04-14 SURGICAL SUPPLY — 38 items
APPLICATOR COTTON TIP WD 3 STR (MISCELLANEOUS) ×4 IMPLANT
BLADE SURG 15 STRL LF DISP TIS (BLADE) ×1 IMPLANT
BLADE SURG 15 STRL SS (BLADE) ×2
CORD BIP STRL DISP 12FT (MISCELLANEOUS) ×2 IMPLANT
DRAPE HEAD BAR (DRAPES) ×2 IMPLANT
GAUZE SPONGE 4X4 12PLY STRL (GAUZE/BANDAGES/DRESSINGS) ×2 IMPLANT
GAUZE SPONGE NON-WVN 2X2 STRL (MISCELLANEOUS) ×10 IMPLANT
GLOVE SURG LX 7.0 MICRO (GLOVE) ×2
GLOVE SURG LX STRL 7.0 MICRO (GLOVE) ×2 IMPLANT
MARKER SKIN XFINE TIP W/RULER (MISCELLANEOUS) ×2 IMPLANT
NDL FILTER BLUNT 18X1 1/2 (NEEDLE) ×1 IMPLANT
NDL HYPO 30X.5 LL (NEEDLE) ×2 IMPLANT
NEEDLE FILTER BLUNT 18X 1/2SAF (NEEDLE) ×1
NEEDLE FILTER BLUNT 18X1 1/2 (NEEDLE) ×1 IMPLANT
NEEDLE HYPO 30X.5 LL (NEEDLE) ×4 IMPLANT
PACK DRAPE NASAL/ENT (PACKS) ×2 IMPLANT
SOL PREP PVP 2OZ (MISCELLANEOUS) ×2
SOLUTION PREP PVP 2OZ (MISCELLANEOUS) ×1 IMPLANT
SPONGE VERSALON 2X2 STRL (MISCELLANEOUS) ×20
SUT CHROMIC 4-0 (SUTURE)
SUT CHROMIC 4-0 M2 12X2 ARM (SUTURE)
SUT CHROMIC 5 0 P 3 (SUTURE) IMPLANT
SUT ETHILON 4 0 CL P 3 (SUTURE) IMPLANT
SUT MERSILENE 4-0 S-2 (SUTURE) IMPLANT
SUT PDS AB 4-0 P3 18 (SUTURE) IMPLANT
SUT PLAIN GUT (SUTURE) ×2 IMPLANT
SUT PROLENE 5 0 P 3 (SUTURE) ×2 IMPLANT
SUT PROLENE 6 0 P 1 18 (SUTURE) ×2 IMPLANT
SUT SILK 4 0 G 3 (SUTURE) IMPLANT
SUT VIC AB 5-0 P-3 18X BRD (SUTURE) IMPLANT
SUT VIC AB 5-0 P3 18 (SUTURE)
SUT VICRYL 6-0  S14 CTD (SUTURE)
SUT VICRYL 6-0 S14 CTD (SUTURE) IMPLANT
SUT VICRYL 7 0 TG140 8 (SUTURE) IMPLANT
SUTURE CHRMC 4-0 M2 12X2 ARM (SUTURE) IMPLANT
SYR 3ML LL SCALE MARK (SYRINGE) ×2 IMPLANT
SYRINGE 10CC LL (SYRINGE) ×2 IMPLANT
WATER STERILE IRR 250ML POUR (IV SOLUTION) ×2 IMPLANT

## 2017-04-14 NOTE — H&P (Signed)
See the history and physical completed at [facility] on 03/31/17 and scanned into the chart.

## 2017-04-14 NOTE — Transfer of Care (Signed)
Immediate Anesthesia Transfer of Care Note  Patient: [name] [name]  Procedure(s) Performed: BLEPHAROPLASTY UPPER EYELID WITH EXCESS SKIN (Bilateral ) PTOSIS REPAIR RESECT EX (Bilateral )  Patient Location: PACU  Anesthesia Type: General  Level of Consciousness: awake, alert  and patient cooperative  Airway and Oxygen Therapy: Patient Spontanous Breathing and Patient connected to supplemental oxygen  Post-op Assessment: Post-op Vital signs reviewed, Patient's Cardiovascular Status Stable, Respiratory Function Stable, Patent Airway and No signs of Nausea or vomiting  Post-op Vital Signs: Reviewed and stable  Complications: No apparent anesthesia complications

## 2017-04-14 NOTE — Anesthesia Procedure Notes (Signed)
Performed by: Jahna Liebert, CRNA Pre-anesthesia Checklist: Patient identified, Emergency Drugs available, Suction available, Timeout performed and Patient being monitored Patient Re-evaluated:Patient Re-evaluated prior to induction Oxygen Delivery Method: Nasal cannula Placement Confirmation: positive ETCO2       

## 2017-04-14 NOTE — Interval H&P Note (Signed)
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 After consideration of risks, benefits and other options for treatment, the patient has consented to  Procedure(s) with comments: BLEPHAROPLASTY UPPER EYELID WITH EXCESS SKIN (Bilateral) PTOSIS REPAIR RESECT EX (Bilateral) - sleep apnea as a surgical intervention .  The patient's history has been reviewed, patient examined, no change in status, stable for surgery.  I have reviewed the patient's chart and labs.  Questions were answered to the patient's satisfaction.     Vickki Muff, [name] M

## 2017-04-14 NOTE — Addendum Note (Signed)
Addendum  created 04/14/17 1306 by Londell Moh, CRNA   Intraprocedure Meds edited

## 2017-04-14 NOTE — Op Note (Signed)
Preoperative Diagnosis:  1. Visually significant blepharoptosis bilateral Upper Eyelid(s) 2. Visually significant dermatochalasis bilateral Upper Eyelid(s)  Postoperative Diagnosis:  Same.  Procedure(s) Performed:   1. Blepharoptosis repair with levator aponeurosis advancement bilateral Upper Eyelid(s) 2. Upper eyelid blepharoplasty with excess skin excision  bilateral Upper Eyelid(s)  Surgeon: Philis Pique. Vickki Muff, McintoshD.  Assistants: none  Anesthesia: MAC  Specimens: None.  Estimated Blood Loss: Minimal.  Complications: None.  Operative Findings: None Dictated  Procedure:   Allergies were reviewed and the patient Hydrocodone.   After the risks, benefits, complications and alternatives were discussed with the patient, appropriate informed consent was obtained.  While seated in an upright position and looking in primary gaze, the mid pupillary line was marked on the upper eyelid margins bilaterally. The patient was then brought to the operating suite and reclined supine.  Timeout was conducted and the patient was sedated.  Local anesthetic consisting of a 50-50 mixture of 2% lidocaine with epinephrine and 0.75% bupivacaine with added Hylenex was injected subcutaneously to both upper eyelid(s). After adequate local was instilled, the patient was prepped and draped in the usual sterile fashion for eyelid surgery.   Attention was turned to the upper eyelids. A 87m upper eyelid crease incision line was marked with calipers on both upper eyelid(s).  A pinch test was used to estimate the amount of excess skin to remove and this was marked in standard blepharoplasty style fashion. Attention was turned to the  right upper eyelid. A #15 blade was used to open the premarked incision line. A skin and partial muscle flap was excised and hemostasis was obtained with bipolar cautery.   A buttonhole was created medially in orbicularis and orbital septum to reveal the medial fat pocket. This was dissected  free from fascial attachments, cauterized towards the pedicle base and excised to produce a nice flattening of the medial corner of the upper eyelid.  Westcott scissors were then used to transect through orbicularis down to the tarsal plate. Epitarsus was dissected to create a smooth surface to suture to. Dissection was then carried superiorly in the plane between orbicularis and orbital septum. Once the preaponeurotic fat pocket was identified, the orbital septum was opened. This revealed the levator and its aponeurosis.    Attention was then turned to the opposite eyelid where the same procedure was performed in the same manner. Hemostasis was obtained with bipolar cautery throughout.   3 interrupted 6-0 Prolene sutures were then passed partial thickness through the tarsal plates of both upper eyelid(s). These sutures were placed in line with the mid pupillary, medial limbal, and lateral limbal lines. The sutures were fixed to the levator aponeurosis and adjusted until a nice lid height and contour were achieved. Once nice symmetry was achieved, the skin incisions were closed with a running 6-0 fast absorbing plain suture. The patient tolerated the procedure well.  Erythromycin ophthalmic ointment was applied to the incision site(s) followed by ice packs. The patient was taken to the recovery area where she recovered without difficulty.  Post-Op Plan/Instructions:  The patient was instructed to use ice packs frequently for the next 48 hours. She was instructed to use erythromycin ophthalmic ointment on her incisions 4 times a day for the next 12 to 14 days. Shewas given a prescription for tramadol for pain control should Tylenol not be effective. [facility] [place] [facility] or sooner as needed for problems.  [name] M.  Vickki Muff, McintoshD. Attending,Ophthalmology

## 2017-04-14 NOTE — Anesthesia Preprocedure Evaluation (Addendum)
Anesthesia Evaluation  Patient identified by MRN, date of birth, ID band Patient awake    Reviewed: Allergy & Precautions, NPO status , Patient's Chart, lab work & pertinent test results, reviewed documented beta blocker date and time   Airway Mallampati: II  TM Distance: >3 FB Neck ROM: Full    Dental no notable dental hx.    Pulmonary sleep apnea ,    Pulmonary exam normal breath sounds clear to auscultation       Cardiovascular hypertension, Normal cardiovascular exam Rhythm:Regular Rate:Normal     Neuro/Psych  Headaches, Anxiety Depression    GI/Hepatic Neg liver ROS, GERD  ,  Endo/Other  Morbid obesity  Renal/GU negative Renal ROS  negative genitourinary   Musculoskeletal  (+) Arthritis ,   Abdominal (+) + obese,   Peds  Hematology   Anesthesia Other Findings   Reproductive/Obstetrics negative OB ROS                            Anesthesia Physical Anesthesia Plan  ASA: II  Anesthesia Plan: General   Post-op Pain Management:    Induction: Intravenous  PONV Risk Score and Plan:   Airway Management Planned: Nasal Cannula and Natural Airway  Additional Equipment: None  Intra-op Plan:   Post-operative Plan:   Informed Consent: I have reviewed the patients History and Physical, chart, labs and discussed the procedure including the risks, benefits and alternatives for the proposed anesthesia with the patient or authorized representative who has indicated his/her understanding and acceptance.     Plan Discussed with: CRNA, Anesthesiologist and Surgeon  Anesthesia Plan Comments:         Anesthesia Quick Evaluation

## 2017-04-14 NOTE — Anesthesia Postprocedure Evaluation (Signed)
Anesthesia Post Note  Patient: [name] [name]  Procedure(s) Performed: BLEPHAROPLASTY UPPER EYELID WITH EXCESS SKIN (Bilateral ) PTOSIS REPAIR RESECT EX (Bilateral )  Patient location during evaluation: PACU Anesthesia Type: General Level of consciousness: awake Pain management: pain level controlled Vital Signs Assessment: post-procedure vital signs reviewed and stable Respiratory status: spontaneous breathing Cardiovascular status: blood pressure returned to baseline Postop Assessment: no headache Anesthetic complications: no    Lavonna Monarch

## 2017-04-15 ENCOUNTER — Encounter: Payer: Self-pay | Admitting: Ophthalmology

## 2017-04-23 DIAGNOSIS — M1612 Unilateral primary osteoarthritis, left hip: Secondary | ICD-10-CM | POA: Diagnosis not present

## 2017-04-23 DIAGNOSIS — M5136 Other intervertebral disc degeneration, lumbar region: Secondary | ICD-10-CM | POA: Diagnosis not present

## 2017-04-29 DIAGNOSIS — M1612 Unilateral primary osteoarthritis, left hip: Secondary | ICD-10-CM | POA: Diagnosis not present

## 2017-05-25 DIAGNOSIS — G4733 Obstructive sleep apnea (adult) (pediatric): Secondary | ICD-10-CM | POA: Diagnosis not present

## 2017-06-05 DIAGNOSIS — M1612 Unilateral primary osteoarthritis, left hip: Secondary | ICD-10-CM | POA: Diagnosis not present

## 2017-06-12 DIAGNOSIS — G4733 Obstructive sleep apnea (adult) (pediatric): Secondary | ICD-10-CM | POA: Diagnosis not present

## 2017-07-06 ENCOUNTER — Other Ambulatory Visit: Payer: Self-pay | Admitting: Family Medicine

## 2017-07-06 DIAGNOSIS — I10 Essential (primary) hypertension: Secondary | ICD-10-CM

## 2017-08-06 ENCOUNTER — Ambulatory Visit (INDEPENDENT_AMBULATORY_CARE_PROVIDER_SITE_OTHER): Payer: PPO | Admitting: Family Medicine

## 2017-08-06 ENCOUNTER — Encounter: Payer: Self-pay | Admitting: Family Medicine

## 2017-08-06 DIAGNOSIS — E7800 Pure hypercholesterolemia, unspecified: Secondary | ICD-10-CM | POA: Diagnosis not present

## 2017-08-06 DIAGNOSIS — N393 Stress incontinence (female) (male): Secondary | ICD-10-CM

## 2017-08-06 DIAGNOSIS — J069 Acute upper respiratory infection, unspecified: Secondary | ICD-10-CM | POA: Diagnosis not present

## 2017-08-06 MED ORDER — BENZONATATE 200 MG PO CAPS: 200.0000 mg | ORAL_CAPSULE | Freq: Three times a day (TID) | ORAL | 1 refills | Status: DC | PRN

## 2017-08-06 NOTE — Progress Notes (Signed)
She has seasonal allergies at baseline, for months.  Then has ST and dry cough starting about 5 days ago.   Rhinorrhea: yes congestion:no ear pain:no sore throat:prev yes, irritated.   Cough:yes, some sputum.   Myalgias: see below.   Facial pain: mild, R sided.   Fevers: no Hoarse voice: yes  She cut the simvastatin down to MWF prev.  She still had aches and then stopped the medicine. She had hip and leg pain, s/p injection per ortho clinic with relief, either from the injection or from being off statin, or both.  D/w pt.    She has some SUI and d/w pt about trying kegel exercises.  She wears a pad.  D/w pt.    Per HPI unless specifically indicated in ROS section   Meds, vitals, and allergies reviewed.   GEN: nad, alert and oriented HEENT: mucous membranes moist, TM w/o erythema, nasal epithelium injected, OP with cobblestoning NECK: supple w/o LA CV: rrr. PULM: ctab, no inc wob ABD: soft, +bs EXT: no edema Sinuses not ttp x4

## 2017-08-06 NOTE — Patient Instructions (Signed)
Rest and fluids.   Use mucinex with a lot of fluids.  Tessalon for the cough.  Update me as needed.  Stay off the simvastatin for now and we'll check your labs later on.  Take care.  Glad to see you.

## 2017-08-07 ENCOUNTER — Ambulatory Visit: Payer: Self-pay | Admitting: *Deleted

## 2017-08-07 DIAGNOSIS — J069 Acute upper respiratory infection, unspecified: Secondary | ICD-10-CM | POA: Insufficient documentation

## 2017-08-07 DIAGNOSIS — N393 Stress incontinence (female) (male): Secondary | ICD-10-CM | POA: Insufficient documentation

## 2017-08-07 NOTE — Assessment & Plan Note (Addendum)
She cut the simvastatin down to MWF prev.  She still had aches and then stopped the medicine. She had hip and leg pain, s/p injection per ortho clinic with relief, either from the injection or from being off statin, or both.  D/w pt. at this point I would stay off the statin, we can recheck her labs later on, she may need statin rechallenge in the future. >25 minutes spent in face to face time with patient, >50% spent in counselling or coordination of care, discussing joint aches, statin, urinary incontinence, URI, etc.

## 2017-08-07 NOTE — Assessment & Plan Note (Signed)
Likely viral.  Nontoxic.  Update me as needed. Rest and fluids.   Use mucinex with a lot of fluids.  Tessalon for the cough.  D/w pt.

## 2017-08-07 NOTE — Assessment & Plan Note (Signed)
We talked about Kegel exercises.  She can restart those.  Update me as needed.  She does not have burning with urination.  She wears a pad as needed.

## 2017-08-10 ENCOUNTER — Ambulatory Visit: Payer: PPO | Admitting: Family Medicine

## 2017-08-12 ENCOUNTER — Telehealth: Payer: Self-pay | Admitting: Family Medicine

## 2017-08-12 MED ORDER — PROMETHAZINE-DM 6.25-15 MG/5ML PO SYRP: 2.5000 mL | ORAL_SOLUTION | Freq: Four times a day (QID) | ORAL | 0 refills | Status: DC | PRN

## 2017-08-12 NOTE — Telephone Encounter (Signed)
rx sent.  F/u prn.  Thanks.

## 2017-08-12 NOTE — Telephone Encounter (Signed)
Patient notified as instructed by telephone and verbalized understanding. 

## 2017-08-12 NOTE — Telephone Encounter (Signed)
Tessalon not helping the cough a lot; cough is worse at night; prod cough with pale green phlegm, doesn't think has wheezing; pt cannot use cpap with cough. SOB after prolonged coughing. Throat is still scratchy but thinks due to coughing so much.No fever. Pt seen 08/06/17. Pt has tried [employer] and [place] without effectiveness.[facility].Please advise.

## 2017-08-12 NOTE — Telephone Encounter (Signed)
Copied from [place] [id number]. Topic: Quick Communication - See Telephone Encounter >> Aug 12, 2017  9:18 AM Aurelio Brash B wrote: CRM for notification. See Telephone encounter for:  PT  is requesting cough syrup , she states  she has a productive cough that wakes her up at night and makes going out difficult.    [place], [place] - [employer] [phone] (Phone) [phone] (Fax)    08/12/17.

## 2017-08-17 ENCOUNTER — Encounter: Payer: Self-pay | Admitting: Family Medicine

## 2017-08-17 ENCOUNTER — Ambulatory Visit (INDEPENDENT_AMBULATORY_CARE_PROVIDER_SITE_OTHER): Payer: PPO | Admitting: Family Medicine

## 2017-08-17 ENCOUNTER — Ambulatory Visit (INDEPENDENT_AMBULATORY_CARE_PROVIDER_SITE_OTHER): Payer: PPO

## 2017-08-17 VITALS — BP 120/90 | HR 69 | Temp 98.5°F | Ht 59.25 in | Wt 173.8 lb

## 2017-08-17 DIAGNOSIS — Z0000 Encounter for general adult medical examination without abnormal findings: Secondary | ICD-10-CM

## 2017-08-17 DIAGNOSIS — E7800 Pure hypercholesterolemia, unspecified: Secondary | ICD-10-CM | POA: Diagnosis not present

## 2017-08-17 DIAGNOSIS — R093 Abnormal sputum: Secondary | ICD-10-CM

## 2017-08-17 DIAGNOSIS — R059 Cough, unspecified: Secondary | ICD-10-CM

## 2017-08-17 DIAGNOSIS — I10 Essential (primary) hypertension: Secondary | ICD-10-CM

## 2017-08-17 DIAGNOSIS — R05 Cough: Secondary | ICD-10-CM | POA: Diagnosis not present

## 2017-08-17 LAB — COMPREHENSIVE METABOLIC PANEL
ALT: 19 U/L (ref 0–35)
AST: 23 U/L (ref 0–37)
Albumin: 3.8 g/dL (ref 3.5–5.2)
Alkaline Phosphatase: 87 U/L (ref 39–117)
BUN: 18 mg/dL (ref 6–23)
CO2: 28 mEq/L (ref 19–32)
Calcium: 9.8 mg/dL (ref 8.4–10.5)
Chloride: 106 mEq/L (ref 96–112)
Creatinine, Ser: 0.74 mg/dL (ref 0.40–1.20)
GFR: 81.34 mL/min (ref >60.00)
Glucose, Bld: 111 mg/dL — ABNORMAL HIGH (ref 70–99)
Potassium: 4.3 mEq/L (ref 3.5–5.1)
Sodium: 142 mEq/L (ref 135–145)
Total Bilirubin: 0.4 mg/dL (ref 0.2–1.2)
Total Protein: 6.9 g/dL (ref 6.0–8.3)

## 2017-08-17 LAB — CBC WITH DIFFERENTIAL/PLATELET
Basophils Absolute: 0 10*3/uL (ref 0.0–0.1)
Basophils Relative: 0.7 % (ref 0.0–3.0)
Eosinophils Absolute: 0.2 10*3/uL (ref 0.0–0.7)
Eosinophils Relative: 3.8 % (ref 0.0–5.0)
HCT: 43.6 % (ref 36.0–46.0)
Hemoglobin: 14.8 g/dL (ref 12.0–15.0)
Lymphocytes Relative: 23.5 % (ref 12.0–46.0)
Lymphs Abs: 1.5 10*3/uL (ref 0.7–4.0)
MCHC: 34.1 g/dL (ref 30.0–36.0)
MCV: 93.6 fl (ref 78.0–100.0)
Monocytes Absolute: 0.6 10*3/uL (ref 0.1–1.0)
Monocytes Relative: 9.1 % (ref 3.0–12.0)
Neutro Abs: 4 10*3/uL (ref 1.4–7.7)
Neutrophils Relative %: 62.9 % (ref 43.0–77.0)
Platelets: 303 10*3/uL (ref 150.0–400.0)
RBC: 4.65 Mil/uL (ref 3.87–5.11)
RDW: 13.4 % (ref 11.5–15.5)
WBC: 6.3 10*3/uL (ref 4.0–10.5)

## 2017-08-17 LAB — HEMOGLOBIN A1C: Hgb A1c MFr Bld: 5.4 % (ref 4.6–6.5)

## 2017-08-17 LAB — LIPID PANEL
Cholesterol: 192 mg/dL (ref 0–200)
HDL: 31.6 mg/dL — ABNORMAL LOW (ref >39.00)
LDL Cholesterol: 131 mg/dL — ABNORMAL HIGH (ref 0–99)
NonHDL: 160.23
Total CHOL/HDL Ratio: 6
Triglycerides: 145 mg/dL (ref 0.0–149.0)
VLDL: 29 mg/dL (ref 0.0–40.0)

## 2017-08-17 LAB — LDL CHOLESTEROL, DIRECT: Direct LDL: 140 mg/dL

## 2017-08-17 MED ORDER — DOXYCYCLINE HYCLATE 100 MG PO TABS: 100.0000 mg | ORAL_TABLET | Freq: Two times a day (BID) | ORAL | 0 refills | Status: DC

## 2017-08-17 NOTE — Progress Notes (Signed)
Subjective:   [name] [name] is a 77 y.o. female who presents for Medicare Annual (Subsequent) preventive examination.  Review of Systems:  N/A Cardiac Risk Factors include: advanced age (>26men, >1 women);obesity (BMI >30kg/m2);dyslipidemia     Objective:     Vitals: BP 120/90 (BP Location: Right Arm, Patient Position: Sitting, Cuff Size: Normal)   Pulse 69   Temp 98.5 F (36.9 C) (Oral)   Ht 4' 11.25" (1.505 m) Comment: no shoes  Wt 173 lb 12 oz (78.8 kg)   SpO2 96%   BMI 34.80 kg/m   Body mass index is 34.8 kg/m.  Advanced Directives 08/17/2017 04/14/2017 08/14/2016 02/26/2016 02/05/2016 06/20/2015 06/20/2015  Does Patient Have a Medical Advance Directive? Yes Yes Yes Yes Yes Yes Yes  Type of Paramedic of [place];Living will [place];Living will [place];Living will - Living will Living will -  Does patient want to make changes to medical advance directive? - - - - - No - Patient declined -  Copy of [place] in Chart? No - copy requested Yes No - copy requested - No - copy requested No - copy requested -    Tobacco Social History   Tobacco Use  Smoking Status Never Smoker  Smokeless Tobacco Never Used     Counseling given: No   Clinical Intake:  Pre-visit preparation completed: Yes  Pain Score: 2 (throat)     Nutritional Status: BMI > 30  Obese Nutritional Risks: None Diabetes: No  How often do you need to have someone help you when you read instructions, pamphlets, or other written materials from your doctor or pharmacy?: 1 - Never What is the last grade level you completed in school?: Bachelor degree  Interpreter Needed?: No  Comments: pt is a widow Information entered by :: LPinson, LPN  Past Medical History:  Diagnosis Date  . Arthritis    knees  . Depression   . Fatty liver   . GERD (gastroesophageal reflux disease)   . Hyperlipidemia   . Hypertension   .  IBS (irritable bowel syndrome)   . Obesity   . Sleep apnea    uses CPAP   Past Surgical History:  Procedure Laterality Date  . BLADDER SURGERY    . BREAST SURGERY     breast biopsy  . BROW LIFT Bilateral 04/14/2017   Procedure: BLEPHAROPLASTY UPPER EYELID WITH EXCESS SKIN;  Surgeon: Karle Starch, MD;  Location: [facility];  Service: Ophthalmology;  Laterality: Bilateral;  . CATARACT EXTRACTION W/PHACO Left 02/05/2016   Procedure: CATARACT EXTRACTION PHACO AND INTRAOCULAR LENS PLACEMENT (IOC);  Surgeon: Birder Robson, MD;  Location: [facility];  Service: Ophthalmology;  Laterality: Left;  [place] 01:10 AP% 22.3 CDE 15.67 Fluid pack lot # [id number]  . CATARACT EXTRACTION W/PHACO Right 02/26/2016   Procedure: CATARACT EXTRACTION PHACO AND INTRAOCULAR LENS PLACEMENT (IOC);  Surgeon: Birder Robson, MD;  Location: [facility];  Service: Ophthalmology;  Laterality: Right;  [place] 57.4 AP% 24.0 CDE 13.75 Fluid Pack lot # [id number]  . CHOLECYSTECTOMY    . COLONOSCOPY WITH PROPOFOL N/A 12/18/2014   Procedure: COLONOSCOPY WITH PROPOFOL;  Surgeon: Manya Silvas, MD;  [facility];  Service: Endoscopy;  Laterality: N/A;  . DEEP NECK LYMPH NODE BIOPSY / EXCISION    . JOINT REPLACEMENT    . KNEE ARTHROPLASTY Left 06/20/2015   Procedure: COMPUTER ASSISTED TOTAL KNEE ARTHROPLASTY;  Surgeon: Dereck Leep, MD;  Location: [facility];  Service: Orthopedics;  Laterality: Left;  . KNEE SURGERY    . PTOSIS REPAIR Bilateral 04/14/2017   Procedure: PTOSIS REPAIR RESECT EX;  Surgeon: Karle Starch, MD;  Location: [facility];  Service: Ophthalmology;  Laterality: Bilateral;  sleep apnea  . TONSILLECTOMY     Family History  Problem Relation Age of Onset  . Heart attack Mother   . Dementia Father   . Aortic aneurysm Brother   . Colon cancer Neg Hx   . Breast cancer Neg Hx    Social History   Socioeconomic History  . Marital status: Widowed    Spouse name: Not on file  . Number of  children: Not on file  . Years of education: Not on file  . Highest education level: Not on file  Occupational History  . Not on file  Social Needs  . Financial resource strain: Not on file  . Food insecurity:    Worry: Not on file    Inability: Not on file  . Transportation needs:    Medical: Not on file    Non-medical: Not on file  Tobacco Use  . Smoking status: Never Smoker  . Smokeless tobacco: Never Used  Substance and Sexual Activity  . Alcohol use: Yes    Comment: ocassional glass of wine 2-3 times a year  . Drug use: No  . Sexual activity: Never  Lifestyle  . Physical activity:    Days per week: Not on file    Minutes per session: Not on file  . Stress: Not on file  Relationships  . Social connections:    Talks on phone: Not on file    Gets together: Not on file    Attends religious service: Not on file    Active member of club or organization: Not on file    Attends meetings of clubs or organizations: Not on file    Relationship status: Not on file  Other Topics Concern  . Not on file  Social History Narrative   Widowed 2016 after 80 years of marriage   1 daughter locally   2 sons (twins)   8 grandkids    Outpatient Encounter Medications as of 08/17/2017  Medication Sig  . acetaminophen (TYLENOL) 650 MG CR tablet Take 650 mg every 8 (eight) hours as needed by mouth for pain.  [name] Kitchen aspirin EC 81 MG tablet Take 81 mg by mouth 2 (two) times daily.  [name] Kitchen atenolol (TENORMIN) 50 MG tablet TAKE ONE TABLET TWICE DAILY  . benzonatate (TESSALON) 200 MG capsule Take 1 capsule (200 mg total) by mouth 3 (three) times daily as needed.  . Biotin 10000 MCG TABS Take 10,000 mcg by mouth daily.  . calcium carbonate (TUMS EX) 750 MG chewable tablet Chew 1 tablet by mouth at bedtime as needed for heartburn.  . cetirizine (ZYRTEC) 10 MG tablet Take 10 mg by mouth as needed.   . cholecalciferol (VITAMIN D) 1000 UNITS tablet Take 1,000 Units by mouth daily. In am  . clobetasol  ointment (TEMOVATE) 7.02 % Apply 1 application topically daily as needed (for itching).  . Glucosamine-Chondroit-Vit C-Mn (GLUCOSAMINE CHONDR 1500 COMPLX PO) Take 1 tablet by mouth 2 (two) times daily.  . Magnesium 500 MG TABS Take by mouth daily.  . naproxen sodium (ANAPROX) 220 MG tablet Take 220 mg by mouth as needed.  . Probiotic Product (PROBIOTIC DAILY PO) Take by mouth daily.  . promethazine-dextromethorphan (PROMETHAZINE-DM) 6.25-15 MG/5ML syrup Take 2.5-5 mLs by mouth 4 (four) times  daily as needed for cough.   No facility-administered encounter medications on file as of 08/17/2017.     Activities of Daily Living In your present state of health, do you have any difficulty performing the following activities: 08/17/2017 04/14/2017  Hearing? N N  Vision? N N  Difficulty concentrating or making decisions? N N  Walking or climbing stairs? Y N  Comment right knee pain -  Dressing or bathing? N N  Doing errands, shopping? N -  Preparing Food and eating ? N -  Using the Toilet? N -  In the past six months, have you accidently leaked urine? Y -  Do you have problems with loss of bowel control? N -  Managing your Medications? N -  Managing your Finances? N -  Housekeeping or managing your Housekeeping? N -  Some recent data might be hidden    Patient Care Team: Tonia Ghent, MD as PCP - General (Family Medicine) Birder Robson, MD as Referring Physician (Ophthalmology) Marry Guan Laurice Record, MD as Consulting Physician (Orthopedic Surgery)    Assessment:   This is a routine wellness examination for Rozann.   Hearing Screening   125Hz  250Hz  500Hz  1000Hz  2000Hz  3000Hz  4000Hz  6000Hz  8000Hz   Right ear:   40 40 40  40    Left ear:   40 40 40  40    Vision Screening Comments: Last vision exam in 2018  Exercise Activities and Dietary recommendations Current Exercise Habits: The patient does not participate in regular exercise at present, Exercise limited by: respiratory  conditions(s)(respiratory concerns)  Goals    . DIET - INCREASE WATER INTAKE     Starting 08/17/2017, I will continue to drink 6-8 glasses of water daily.        Fall Risk Fall Risk  08/17/2017 08/21/2016 08/14/2016 05/07/2015 11/29/2014  Falls in the past year? No No No No No   Depression Screen PHQ 2/9 Scores 08/17/2017 08/21/2016 08/14/2016 05/07/2015  PHQ - 2 Score 0 0 0 0  PHQ- 9 Score 0 - - -  Exception Documentation - - - -     Cognitive Function MMSE - Mini Mental State Exam 08/17/2017 08/14/2016  Orientation to time 5 5  Orientation to Place 5 5  Registration 3 3  Attention/ Calculation 0 0  Recall 3 3  Language- name 2 objects 0 0  Language- repeat 1 1  Language- follow 3 step command 3 3  Language- read & follow direction 0 0  Write a sentence 0 0  Copy design 0 0  Total score 20 20     PLEASE NOTE: A Mini-Cog screen was completed. Maximum score is 20. A value of 0 denotes this part of [place] MMSE was not completed or the patient failed this part of the Mini-Cog screening.   Mini-Cog Screening Orientation to Time - Max 5 pts Orientation to Place - Max 5 pts Registration - Max 3 pts Recall - Max 3 pts Language Repeat - Max 1 pts Language Follow 3 Step Command - Max 3 pts     Immunization History  Administered Date(s) Administered  . Influenza Whole 05/09/2004  . Influenza, High Dose Seasonal PF 04/01/2017  . Influenza, Seasonal, Injecte, Preservative Fre 03/09/2016  . Influenza,inj,Quad PF,6+ Mos 02/06/2014  . Influenza-Unspecified 01/24/2014  . Pneumococcal Conjugate-13 04/25/2014  . Pneumococcal Polysaccharide-23 03/30/2007  . Pneumococcal-Unspecified 09/17/2007  . Td 10/24/2005  . Tdap 01/18/2016  . Zoster 05/26/2008    Screening Tests Health Maintenance  Topic Date Due  .  MAMMOGRAM  11/22/2017  . COLONOSCOPY  12/17/2024  . TETANUS/TDAP  01/17/2026  . INFLUENZA VACCINE  Completed  . DEXA SCAN  Completed  . PNA vac Low Risk Adult  Completed        Plan:     I have personally reviewed, addressed, and noted the following in the patient's chart:  A. Medical and social history B. Use of alcohol, tobacco or illicit drugs  C. Current medications and supplements D. Functional ability and status E.  Nutritional status F.  Physical activity G. Advance directives H. List of other physicians I.  Hospitalizations, surgeries, and ER visits in previous 12 months J.  [place] to include hearing, vision, cognitive, depression L. Referrals and appointments - none  In addition, I have reviewed and discussed with patient certain preventive protocols, quality metrics, and best practice recommendations. A written personalized care plan for preventive services as well as general preventive health recommendations were provided to patient.  See attached scanned questionnaire for additional information.   Signed,   Lindell Noe, MHA, BS, LPN Health Coach

## 2017-08-17 NOTE — Progress Notes (Signed)
PCP notes:   Health maintenance:  No gaps identified.   Abnormal screenings:   None  Patient concerns:   Pt reports continued concerns with upper respiratory symptoms. Reports sputum is greenish in color. States she is coughing up approximately 1/4 cup of sputum each time. Verbalized concerns with sore throat and states pain is 2/10. PCP notified. Same day acute appt scheduled with PCP.   Nurse concerns:  None  Next PCP appt:   08/17/17 @ 1030  I reviewed health advisor's note, was available for consultation on the day of service listed in this note, and agree with documentation and plan. Elsie Stain, MD.

## 2017-08-17 NOTE — Progress Notes (Signed)
duration of symptoms: 2 weeks.   Rhinorrhea: yes Congestion: yes ear pain: no sore throat:yes Cough: yes, with discolored sputum.  Myalgias: no Voice is hoarse.  No facial pain.   No wheeze.   She has tried to use CPAP, but that has been more difficult with congestion.   Cough syrup helps some, when needed.    Per HPI unless specifically indicated in ROS section   Meds, vitals, and allergies reviewed.   GEN: nad, alert and oriented HEENT: mucous membranes moist, TM w/o erythema, nasal epithelium injected, OP with cobblestoning NECK: supple w/o LA CV: rrr. PULM: ctab, no inc wob ABD: soft, +bs EXT: no edema

## 2017-08-17 NOTE — Patient Instructions (Signed)
Start the antibiotics today, try to get some rest and update me as needed.  Sunburn caution on the medicine.   Take care.  Glad to see you.

## 2017-08-17 NOTE — Patient Instructions (Signed)
[name] [name] , Thank you for taking time to come for your Medicare Wellness Visit. I appreciate your ongoing commitment to your health goals. Please review the following plan we discussed and let me know if I can assist you in the future.   These are the goals we discussed: Goals    . DIET - INCREASE WATER INTAKE     Starting 08/17/2017, I will continue to drink 6-8 glasses of water daily.        This is a list of the screening recommended for you and due dates:  Health Maintenance  Topic Date Due  . Mammogram  11/22/2017  . Colon Cancer Screening  12/17/2024  . Tetanus Vaccine  01/17/2026  . Flu Shot  Completed  . DEXA scan (bone density measurement)  Completed  . Pneumonia vaccines  Completed   Preventive Care for Adults  A healthy lifestyle and preventive care can promote health and wellness. Preventive health guidelines for adults include the following key practices.  . A routine yearly physical is a good way to check with your health care provider about your health and preventive screening. It is a chance to share any concerns and updates on your health and to receive a thorough exam.  . Visit your dentist for a routine exam and preventive care every 6 months. Brush your teeth twice a day and floss once a day. Good oral hygiene prevents tooth decay and gum disease.  . The frequency of eye exams is based on your age, health, family medical history, use  of contact lenses, and other factors. Follow your health care provider's recommendations for frequency of eye exams.  . Eat a healthy diet. Foods like vegetables, fruits, whole grains, low-fat dairy products, and lean protein foods contain the nutrients you need without too many calories. Decrease your intake of foods high in solid fats, added sugars, and salt. Eat the right amount of calories for you. Get information about a proper diet from your health care provider, if necessary.  . Regular physical exercise is one of the most  important things you can do for your health. Most adults should get at least 150 minutes of moderate-intensity exercise (any activity that increases your heart rate and causes you to sweat) each week. In addition, most adults need muscle-strengthening exercises on 2 or more days a week.  Silver Sneakers may be a benefit available to you. To determine eligibility, you may visit the website: www.silversneakers.com or contact program at 220-614-4078 Mon-Fri between 8AM-8PM.   . Maintain a healthy weight. The body mass index (BMI) is a screening tool to identify possible weight problems. It provides an estimate of body fat based on height and weight. Your health care provider can find your BMI and can help you achieve or maintain a healthy weight.   For adults 20 years and older: ? A BMI below 18.5 is considered underweight. ? A BMI of 18.5 to 24.9 is normal. ? A BMI of 25 to 29.9 is considered overweight. ? A BMI of 30 and above is considered obese.   . Maintain normal blood lipids and cholesterol levels by exercising and minimizing your intake of saturated fat. Eat a balanced diet with plenty of fruit and vegetables. Blood tests for lipids and cholesterol should begin at age 26 and be repeated every 5 years. If your lipid or cholesterol levels are high, you are over 50, or you are at high risk for heart disease, you may need your cholesterol levels checked  more frequently. Ongoing high lipid and cholesterol levels should be treated with medicines if diet and exercise are not working.  . If you smoke, find out from your health care provider how to quit. If you do not use tobacco, please do not start.  . If you choose to drink alcohol, please do not consume more than 2 drinks per day. One drink is considered to be 12 ounces (355 mL) of beer, 5 ounces (148 mL) of wine, or 1.5 ounces (44 mL) of liquor.  . If you are 12-72 years old, ask your health care provider if you should take aspirin to prevent  strokes.  . Use sunscreen. Apply sunscreen liberally and repeatedly throughout the day. You should seek shade when your shadow is shorter than you. Protect yourself by wearing long sleeves, pants, a wide-brimmed hat, and sunglasses year round, whenever you are outdoors.  . Once a month, do a whole body skin exam, using a mirror to look at the skin on your back. Tell your health care provider of new moles, moles that have irregular borders, moles that are larger than a pencil eraser, or moles that have changed in shape or color.

## 2017-08-18 ENCOUNTER — Telehealth: Payer: Self-pay | Admitting: *Deleted

## 2017-08-18 ENCOUNTER — Ambulatory Visit: Payer: PPO | Admitting: Family Medicine

## 2017-08-18 ENCOUNTER — Ambulatory Visit (INDEPENDENT_AMBULATORY_CARE_PROVIDER_SITE_OTHER)
Admission: RE | Admit: 2017-08-18 | Discharge: 2017-08-18 | Disposition: A | Discharge status: Home / Self Care | Payer: PPO | Source: Ambulatory Visit | Attending: Family Medicine | Admitting: Family Medicine

## 2017-08-18 DIAGNOSIS — R05 Cough: Secondary | ICD-10-CM | POA: Insufficient documentation

## 2017-08-18 DIAGNOSIS — R059 Cough, unspecified: Secondary | ICD-10-CM

## 2017-08-18 DIAGNOSIS — R0602 Shortness of breath: Secondary | ICD-10-CM | POA: Diagnosis not present

## 2017-08-18 NOTE — Assessment & Plan Note (Signed)
We will treat given the duration, discolored sputum.  Nontoxic.  Okay for outpatient follow-up.  Routine cautions given regarding doxycycline.  Supportive care otherwise.  She agrees.

## 2017-08-18 NOTE — Addendum Note (Signed)
Addended by: Tonia Ghent on: 08/18/2017 01:54 PM   Modules accepted: Orders

## 2017-08-18 NOTE — Telephone Encounter (Signed)
Patient advised.

## 2017-08-18 NOTE — Telephone Encounter (Signed)
I put in the order.  Wouldn't need OV to get this done.  Thanks.

## 2017-08-18 NOTE — Telephone Encounter (Signed)
Patient says she feels really weak and is spitting up green phlegm.  She has signed up to go on a trip, leaving Thursday and coming back on Saturday.  Patient is concerned about going on the trip and getting worse and requests a CXR to verify there is no pneumonia.

## 2017-08-21 ENCOUNTER — Encounter: Payer: PPO | Admitting: Family Medicine

## 2017-09-10 DIAGNOSIS — G4733 Obstructive sleep apnea (adult) (pediatric): Secondary | ICD-10-CM | POA: Diagnosis not present

## 2017-09-14 ENCOUNTER — Encounter: Payer: Self-pay | Admitting: Family Medicine

## 2017-09-14 ENCOUNTER — Ambulatory Visit (INDEPENDENT_AMBULATORY_CARE_PROVIDER_SITE_OTHER): Payer: PPO | Admitting: Family Medicine

## 2017-09-14 VITALS — BP 120/90 | HR 69 | Temp 98.5°F | Ht 59.25 in | Wt 173.8 lb

## 2017-09-14 DIAGNOSIS — R300 Dysuria: Secondary | ICD-10-CM

## 2017-09-14 DIAGNOSIS — Z7189 Other specified counseling: Secondary | ICD-10-CM

## 2017-09-14 DIAGNOSIS — I10 Essential (primary) hypertension: Secondary | ICD-10-CM

## 2017-09-14 DIAGNOSIS — N393 Stress incontinence (female) (male): Secondary | ICD-10-CM | POA: Diagnosis not present

## 2017-09-14 DIAGNOSIS — Z0000 Encounter for general adult medical examination without abnormal findings: Secondary | ICD-10-CM

## 2017-09-14 DIAGNOSIS — G4730 Sleep apnea, unspecified: Secondary | ICD-10-CM

## 2017-09-14 DIAGNOSIS — E7800 Pure hypercholesterolemia, unspecified: Secondary | ICD-10-CM

## 2017-09-14 MED ORDER — ATENOLOL 50 MG PO TABS: 50.0000 mg | ORAL_TABLET | Freq: Two times a day (BID) | ORAL | 3 refills | Status: DC

## 2017-09-14 NOTE — Patient Instructions (Signed)
Go to the lab on the way out.  We'll contact you with your lab report. Start with Kegel exercise- 10 second per contraction, 5 sets of 5 reps per day.   Don't change your meds for now.  Take care.  Glad to see you.

## 2017-09-14 NOTE — Progress Notes (Signed)
Her cough is better in the meantime.  D/w pt.    Hypertension:    Using medication without problems or lightheadedness: yes Chest pain with exertion:no Edema:no Short of breath:no D/w pt about diet and exercise.   D/w pt about statin use.  She had cut simvastatin down to MWF prev.  She still had aches and then stopped the medicine. She had hip and leg pain, s/p injection per ortho clinic with relief, either from the injection or from being off statin, or both.  She was off statin when labs were drawn.  It may make sense to stay off statin for now and see if the aches return when her injection wears off.     OSA on CPAP.  Compliant.  With relief with use.    Vaccines up to date.   Colonoscopy 2016 mammogram up to date.  DXA 2017 per outside clinic.   Living will d/w pt. Daughter [name] designated if patient were incapacitated.   She has occ dysuria.  She has some sensitivity at the end of urination with frequency.  She used AZO and that helped with symptoms.  She can f/u with gyn clinic if needed.  D/w pt about kegel exercises.    Meds, vitals, and allergies reviewed.   PMH and SH reviewed  ROS: Per HPI unless specifically indicated in ROS section   GEN: nad, alert and oriented HEENT: mucous membranes moist NECK: supple w/o LA CV: rrr. PULM: ctab, no inc wob ABD: soft, +bs, not ttp  EXT: no edema SKIN: no acute rash

## 2017-09-15 DIAGNOSIS — Z0000 Encounter for general adult medical examination without abnormal findings: Secondary | ICD-10-CM | POA: Insufficient documentation

## 2017-09-15 LAB — URINE CULTURE
MICRO NUMBER:: 90490627
SPECIMEN QUALITY:: ADEQUATE

## 2017-09-15 NOTE — Assessment & Plan Note (Signed)
Vaccines up to date.   Colonoscopy 2016 mammogram up to date.  DXA 2017 per outside clinic.   Living will d/w pt. Daughter [name] designated if patient were incapacitated.

## 2017-09-15 NOTE — Assessment & Plan Note (Signed)
Living will d/w pt. Daughter [name] designated if patient were incapacitated.

## 2017-09-15 NOTE — Assessment & Plan Note (Addendum)
No change in meds.  Continue work on diet and exercise.  Labs discussed with patient.  She agrees. >25 minutes spent in face to face time with patient, >50% spent in counselling or coordination of care, discussing hypertension, urinary symptoms, hypercholesterolemia, etc.

## 2017-09-15 NOTE — Assessment & Plan Note (Signed)
She had cut simvastatin down to MWF prev.  She still had aches and then stopped the medicine. She had hip and leg pain, s/p injection per ortho clinic with relief, either from the injection or from being off statin, or both.  She was off statin when labs were drawn.  It may make sense to stay off statin for now and see if the aches return when her injection wears off.    Labs d/w pt.

## 2017-09-15 NOTE — Assessment & Plan Note (Signed)
She has occ dysuria.  She has some sensitivity at the end of urination with frequency.  She used AZO and that helped with symptoms.  She can f/u with gyn clinic if needed.  D/w pt about kegel exercises.  Check ucx in the meantime.  She agrees.

## 2017-09-15 NOTE — Assessment & Plan Note (Signed)
OSA on CPAP.  Compliant.  With relief with use.

## 2017-09-21 ENCOUNTER — Other Ambulatory Visit: Payer: Self-pay | Admitting: Family Medicine

## 2017-10-12 DIAGNOSIS — R82998 Other abnormal findings in urine: Secondary | ICD-10-CM | POA: Diagnosis not present

## 2017-10-12 DIAGNOSIS — R350 Frequency of micturition: Secondary | ICD-10-CM | POA: Diagnosis not present

## 2017-10-16 ENCOUNTER — Encounter: Payer: Self-pay | Admitting: Family Medicine

## 2017-10-16 ENCOUNTER — Ambulatory Visit (INDEPENDENT_AMBULATORY_CARE_PROVIDER_SITE_OTHER): Payer: PPO | Admitting: Family Medicine

## 2017-10-16 DIAGNOSIS — M1711 Unilateral primary osteoarthritis, right knee: Secondary | ICD-10-CM

## 2017-10-16 DIAGNOSIS — M26629 Arthralgia of temporomandibular joint, unspecified side: Secondary | ICD-10-CM

## 2017-10-16 DIAGNOSIS — N393 Stress incontinence (female) (male): Secondary | ICD-10-CM | POA: Diagnosis not present

## 2017-10-16 NOTE — Progress Notes (Signed)
Prev urinary issues- She has occ dysuria.  She has some sensitivity at the end of urination with frequency.  She used AZO and that helped with symptoms.  D/w pt about kegel exercises.   ======================================= Update today:  She had seen [facility] and [facility] recently.  Per patient she had u/a done at that point and it was discussed about seeing urology.  She was asking about seeing them, specifically for pelvic floor PT.  She has appointment pending.  Discussed the rationale for urology evaluation also for seeing physical therapy there.  I have great confidence in the staff at [facility] urology, d/w pt.    She got a mouthguard for tooth grinding.  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  She was asking about options.    She was asking about steroid injection/treatment prior to a trip to [place].  She has prev injection per Dr. Marry Guan.  The R knee and hip both hurt.    Meds, vitals, and allergies reviewed.   ROS: Per HPI unless specifically indicated in ROS section   nad ncat audible crepitus on R TMJ ROM.   OP wnl MMM Neck supple, no LA rrr ctab

## 2017-10-16 NOTE — Patient Instructions (Addendum)
Ask the ortho clinic about injection (one or both) vs a steroid taper for your trip to [place].   Stop using the mouthguard for now and call them about options.  Use ice 5 minutes at a a time and don't chew gum.   Take care.  Glad to see you.

## 2017-10-18 DIAGNOSIS — M26629 Arthralgia of temporomandibular joint, unspecified side: Secondary | ICD-10-CM | POA: Insufficient documentation

## 2017-10-18 NOTE — Assessment & Plan Note (Signed)
Discussed with patient.  She mainly wants to be able to walk and have a good functional status during a trip to [place].  She does not have time to have major interventions between now on the trip.  I asked her to talk to orthopedics to see what they could do preemptively so that she would be able to walk better during the trip, even if it was just a short-term measure.  I will defer to orthopedics.  She understood to call for ortho input.

## 2017-10-18 NOTE — Assessment & Plan Note (Signed)
I think it makes a lot of sense for the patient to talk to urology.  I have great confidence in the staff at [facility].  I will await their consult note.  She has an appointment pending.

## 2017-10-18 NOTE — Assessment & Plan Note (Signed)
Unclear if the mouthguard exacerbated her symptoms or if the conditions that previously existed and led to the need for the mouthguard in the first place caused the problem. D/w pt.  Would stop using the mouthguard for now and she'll dentist about options.  Use ice 5 minutes at a a time and don't chew gum.  She agrees.

## 2017-10-20 DIAGNOSIS — R350 Frequency of micturition: Secondary | ICD-10-CM | POA: Diagnosis not present

## 2017-10-20 DIAGNOSIS — R3915 Urgency of urination: Secondary | ICD-10-CM | POA: Diagnosis not present

## 2017-10-20 DIAGNOSIS — M62838 Other muscle spasm: Secondary | ICD-10-CM | POA: Diagnosis not present

## 2017-10-20 DIAGNOSIS — N3941 Urge incontinence: Secondary | ICD-10-CM | POA: Diagnosis not present

## 2017-10-20 DIAGNOSIS — R102 Pelvic and perineal pain: Secondary | ICD-10-CM | POA: Diagnosis not present

## 2017-10-20 DIAGNOSIS — M6281 Muscle weakness (generalized): Secondary | ICD-10-CM | POA: Diagnosis not present

## 2017-10-20 DIAGNOSIS — M6289 Other specified disorders of muscle: Secondary | ICD-10-CM | POA: Diagnosis not present

## 2017-10-21 DIAGNOSIS — M6289 Other specified disorders of muscle: Secondary | ICD-10-CM | POA: Diagnosis not present

## 2017-10-21 DIAGNOSIS — N3941 Urge incontinence: Secondary | ICD-10-CM | POA: Diagnosis not present

## 2017-10-21 DIAGNOSIS — R350 Frequency of micturition: Secondary | ICD-10-CM | POA: Diagnosis not present

## 2017-10-21 DIAGNOSIS — M62838 Other muscle spasm: Secondary | ICD-10-CM | POA: Diagnosis not present

## 2017-10-21 DIAGNOSIS — R102 Pelvic and perineal pain: Secondary | ICD-10-CM | POA: Diagnosis not present

## 2017-10-21 DIAGNOSIS — R3915 Urgency of urination: Secondary | ICD-10-CM | POA: Diagnosis not present

## 2017-10-21 DIAGNOSIS — M6281 Muscle weakness (generalized): Secondary | ICD-10-CM | POA: Diagnosis not present

## 2017-10-28 DIAGNOSIS — N3941 Urge incontinence: Secondary | ICD-10-CM | POA: Diagnosis not present

## 2017-10-28 DIAGNOSIS — M6289 Other specified disorders of muscle: Secondary | ICD-10-CM | POA: Diagnosis not present

## 2017-10-28 DIAGNOSIS — R102 Pelvic and perineal pain: Secondary | ICD-10-CM | POA: Diagnosis not present

## 2017-10-28 DIAGNOSIS — M6281 Muscle weakness (generalized): Secondary | ICD-10-CM | POA: Diagnosis not present

## 2017-10-28 DIAGNOSIS — R3915 Urgency of urination: Secondary | ICD-10-CM | POA: Diagnosis not present

## 2017-10-28 DIAGNOSIS — R350 Frequency of micturition: Secondary | ICD-10-CM | POA: Diagnosis not present

## 2017-10-28 DIAGNOSIS — M62838 Other muscle spasm: Secondary | ICD-10-CM | POA: Diagnosis not present

## 2017-11-04 DIAGNOSIS — L300 Nummular dermatitis: Secondary | ICD-10-CM | POA: Diagnosis not present

## 2017-11-04 DIAGNOSIS — L820 Inflamed seborrheic keratosis: Secondary | ICD-10-CM | POA: Diagnosis not present

## 2017-11-04 DIAGNOSIS — L821 Other seborrheic keratosis: Secondary | ICD-10-CM | POA: Diagnosis not present

## 2017-11-05 DIAGNOSIS — M6289 Other specified disorders of muscle: Secondary | ICD-10-CM | POA: Diagnosis not present

## 2017-11-05 DIAGNOSIS — R3915 Urgency of urination: Secondary | ICD-10-CM | POA: Diagnosis not present

## 2017-11-05 DIAGNOSIS — R350 Frequency of micturition: Secondary | ICD-10-CM | POA: Diagnosis not present

## 2017-11-05 DIAGNOSIS — M62838 Other muscle spasm: Secondary | ICD-10-CM | POA: Diagnosis not present

## 2017-11-05 DIAGNOSIS — R102 Pelvic and perineal pain: Secondary | ICD-10-CM | POA: Diagnosis not present

## 2017-11-05 DIAGNOSIS — M6281 Muscle weakness (generalized): Secondary | ICD-10-CM | POA: Diagnosis not present

## 2017-11-09 DIAGNOSIS — M6289 Other specified disorders of muscle: Secondary | ICD-10-CM | POA: Diagnosis not present

## 2017-11-09 DIAGNOSIS — N3941 Urge incontinence: Secondary | ICD-10-CM | POA: Diagnosis not present

## 2017-11-09 DIAGNOSIS — M62838 Other muscle spasm: Secondary | ICD-10-CM | POA: Diagnosis not present

## 2017-11-09 DIAGNOSIS — R3915 Urgency of urination: Secondary | ICD-10-CM | POA: Diagnosis not present

## 2017-11-09 DIAGNOSIS — M6281 Muscle weakness (generalized): Secondary | ICD-10-CM | POA: Diagnosis not present

## 2017-11-09 DIAGNOSIS — R102 Pelvic and perineal pain: Secondary | ICD-10-CM | POA: Diagnosis not present

## 2017-11-10 DIAGNOSIS — M1711 Unilateral primary osteoarthritis, right knee: Secondary | ICD-10-CM | POA: Diagnosis not present

## 2017-11-10 DIAGNOSIS — M25561 Pain in right knee: Secondary | ICD-10-CM | POA: Diagnosis not present

## 2017-11-18 DIAGNOSIS — M62838 Other muscle spasm: Secondary | ICD-10-CM | POA: Diagnosis not present

## 2017-11-18 DIAGNOSIS — N3941 Urge incontinence: Secondary | ICD-10-CM | POA: Diagnosis not present

## 2017-11-18 DIAGNOSIS — Z1231 Encounter for screening mammogram for malignant neoplasm of breast: Secondary | ICD-10-CM | POA: Diagnosis not present

## 2017-11-18 DIAGNOSIS — Z124 Encounter for screening for malignant neoplasm of cervix: Secondary | ICD-10-CM | POA: Diagnosis not present

## 2017-11-18 DIAGNOSIS — N958 Other specified menopausal and perimenopausal disorders: Secondary | ICD-10-CM | POA: Diagnosis not present

## 2017-11-18 DIAGNOSIS — R350 Frequency of micturition: Secondary | ICD-10-CM | POA: Diagnosis not present

## 2017-11-18 DIAGNOSIS — R102 Pelvic and perineal pain: Secondary | ICD-10-CM | POA: Diagnosis not present

## 2017-11-18 DIAGNOSIS — Z01419 Encounter for gynecological examination (general) (routine) without abnormal findings: Secondary | ICD-10-CM | POA: Diagnosis not present

## 2017-11-18 DIAGNOSIS — Z6834 Body mass index (BMI) 34.0-34.9, adult: Secondary | ICD-10-CM | POA: Diagnosis not present

## 2017-11-18 DIAGNOSIS — M6281 Muscle weakness (generalized): Secondary | ICD-10-CM | POA: Diagnosis not present

## 2017-11-18 DIAGNOSIS — M6289 Other specified disorders of muscle: Secondary | ICD-10-CM | POA: Diagnosis not present

## 2017-11-18 DIAGNOSIS — R3915 Urgency of urination: Secondary | ICD-10-CM | POA: Diagnosis not present

## 2017-11-30 DIAGNOSIS — M6281 Muscle weakness (generalized): Secondary | ICD-10-CM | POA: Diagnosis not present

## 2017-11-30 DIAGNOSIS — R3915 Urgency of urination: Secondary | ICD-10-CM | POA: Diagnosis not present

## 2017-11-30 DIAGNOSIS — M6289 Other specified disorders of muscle: Secondary | ICD-10-CM | POA: Diagnosis not present

## 2017-11-30 DIAGNOSIS — N3941 Urge incontinence: Secondary | ICD-10-CM | POA: Diagnosis not present

## 2017-11-30 DIAGNOSIS — M62838 Other muscle spasm: Secondary | ICD-10-CM | POA: Diagnosis not present

## 2017-11-30 DIAGNOSIS — R102 Pelvic and perineal pain: Secondary | ICD-10-CM | POA: Diagnosis not present

## 2017-11-30 DIAGNOSIS — R350 Frequency of micturition: Secondary | ICD-10-CM | POA: Diagnosis not present

## 2017-12-10 DIAGNOSIS — L812 Freckles: Secondary | ICD-10-CM | POA: Diagnosis not present

## 2017-12-10 DIAGNOSIS — L603 Nail dystrophy: Secondary | ICD-10-CM | POA: Diagnosis not present

## 2017-12-10 DIAGNOSIS — Z1283 Encounter for screening for malignant neoplasm of skin: Secondary | ICD-10-CM | POA: Diagnosis not present

## 2017-12-10 DIAGNOSIS — D1801 Hemangioma of skin and subcutaneous tissue: Secondary | ICD-10-CM | POA: Diagnosis not present

## 2017-12-10 DIAGNOSIS — D225 Melanocytic nevi of trunk: Secondary | ICD-10-CM | POA: Diagnosis not present

## 2017-12-10 DIAGNOSIS — L821 Other seborrheic keratosis: Secondary | ICD-10-CM | POA: Diagnosis not present

## 2017-12-10 DIAGNOSIS — L578 Other skin changes due to chronic exposure to nonionizing radiation: Secondary | ICD-10-CM | POA: Diagnosis not present

## 2017-12-10 DIAGNOSIS — L400 Psoriasis vulgaris: Secondary | ICD-10-CM | POA: Diagnosis not present

## 2017-12-11 DIAGNOSIS — G4733 Obstructive sleep apnea (adult) (pediatric): Secondary | ICD-10-CM | POA: Diagnosis not present

## 2017-12-25 ENCOUNTER — Telehealth: Payer: Self-pay | Admitting: Family Medicine

## 2017-12-25 NOTE — Telephone Encounter (Signed)
I would try flonase.  She can get that OTC. Would try 2 sprays per nostril per day.  Thanks.

## 2017-12-25 NOTE — Telephone Encounter (Signed)
Copied from [place]. Topic: Quick Communication - See Telephone Encounter >> Dec 25, 2017  9:35 AM [name] [name] wrote: Pt is having more than normal allergies. Pt has been taking her  cetirizine (ZYRTEC) 10 MG tablet, but isn't controlling her eyes running and blowing her nose a lot. Pt wants to know if it needs to be increased or what else she needs to do. Please call her back to discuss asap.

## 2017-12-25 NOTE — Telephone Encounter (Signed)
Left detailed message on voicemail.  

## 2018-02-02 DIAGNOSIS — M1711 Unilateral primary osteoarthritis, right knee: Secondary | ICD-10-CM | POA: Diagnosis not present

## 2018-02-04 DIAGNOSIS — M1611 Unilateral primary osteoarthritis, right hip: Secondary | ICD-10-CM | POA: Diagnosis not present

## 2018-02-04 DIAGNOSIS — M1612 Unilateral primary osteoarthritis, left hip: Secondary | ICD-10-CM | POA: Diagnosis not present

## 2018-02-26 ENCOUNTER — Encounter: Payer: Self-pay | Admitting: Family Medicine

## 2018-02-26 ENCOUNTER — Ambulatory Visit (INDEPENDENT_AMBULATORY_CARE_PROVIDER_SITE_OTHER): Payer: PPO | Admitting: Family Medicine

## 2018-02-26 DIAGNOSIS — J069 Acute upper respiratory infection, unspecified: Secondary | ICD-10-CM | POA: Diagnosis not present

## 2018-02-26 MED ORDER — DOXYCYCLINE HYCLATE 100 MG PO TABS: 100.0000 mg | ORAL_TABLET | Freq: Two times a day (BID) | ORAL | 0 refills | Status: DC

## 2018-02-26 NOTE — Progress Notes (Signed)
Patient has started an antibiotic that she had left from previous.  [name], CMA

## 2018-02-26 NOTE — Patient Instructions (Signed)
Since your are better now and your lungs are clear, don't do anything different for now.  If you are worse again tomorrow morning, then start the doxycycline.  Rest and fluids in the meantime.  Take care.  Glad to see you.

## 2018-02-26 NOTE — Progress Notes (Signed)
Recently back from [place].  Presumed sick exposures during the trip.   Sx started about 2 weeks ago.  Initially with ST, hoarse voice. Then cough, sputum. Rhinorrhea.  She used OTC decongestant w/o relief.  She was getting better then has more sneezing/cough.  This AM with more fatigue.  Still with sputum.  She had taken ~3 doses of leftover doxy in the last 1.5 days.   No fevers.  Sputum is yellowish green.  She feels better this afternoon, compared to this AM.    Per HPI unless specifically indicated in ROS section  Meds, vitals, and allergies reviewed.   GEN: nad, alert and oriented HEENT: mucous membranes moist, TM w/o erythema, nasal epithelium injected, OP with cobblestoning NECK: supple w/o LA CV: rrr. PULM: ctab, no inc wob ABD: soft, +bs EXT: no edema

## 2018-02-28 NOTE — Assessment & Plan Note (Signed)
Improved this afternoon.  Nontoxic.  Lungs are clear.  In no apparent distress.  Okay for outpatient follow-up.  Supportive care.  Rest and fluids.  Hold prescription for doxycycline.  If she continues to improve then no need to start antibiotics.  If worse again tomorrow then start antibiotics and update me as needed.  She agrees.

## 2018-03-23 DIAGNOSIS — M170 Bilateral primary osteoarthritis of knee: Secondary | ICD-10-CM | POA: Diagnosis not present

## 2018-03-23 DIAGNOSIS — Z96652 Presence of left artificial knee joint: Secondary | ICD-10-CM | POA: Diagnosis not present

## 2018-03-23 DIAGNOSIS — M1711 Unilateral primary osteoarthritis, right knee: Secondary | ICD-10-CM | POA: Diagnosis not present

## 2018-04-30 DIAGNOSIS — M1711 Unilateral primary osteoarthritis, right knee: Secondary | ICD-10-CM | POA: Diagnosis not present

## 2018-06-17 ENCOUNTER — Ambulatory Visit (INDEPENDENT_AMBULATORY_CARE_PROVIDER_SITE_OTHER): Payer: PPO | Admitting: Family Medicine

## 2018-06-17 ENCOUNTER — Encounter: Payer: Self-pay | Admitting: Family Medicine

## 2018-06-17 VITALS — BP 116/80 | HR 71 | Temp 98.1°F | Ht 59.25 in | Wt 179.8 lb

## 2018-06-17 DIAGNOSIS — M2550 Pain in unspecified joint: Secondary | ICD-10-CM

## 2018-06-17 DIAGNOSIS — R197 Diarrhea, unspecified: Secondary | ICD-10-CM

## 2018-06-17 LAB — CBC WITH DIFFERENTIAL/PLATELET
Basophils Absolute: 0.1 10*3/uL (ref 0.0–0.1)
Basophils Relative: 0.9 % (ref 0.0–3.0)
Eosinophils Absolute: 0.1 10*3/uL (ref 0.0–0.7)
Eosinophils Relative: 1.6 % (ref 0.0–5.0)
HCT: 44.8 % (ref 36.0–46.0)
Hemoglobin: 15.2 g/dL — ABNORMAL HIGH (ref 12.0–15.0)
Lymphocytes Relative: 23.4 % (ref 12.0–46.0)
Lymphs Abs: 1.5 10*3/uL (ref 0.7–4.0)
MCHC: 34 g/dL (ref 30.0–36.0)
MCV: 92.7 fl (ref 78.0–100.0)
Monocytes Absolute: 0.5 10*3/uL (ref 0.1–1.0)
Monocytes Relative: 8 % (ref 3.0–12.0)
Neutro Abs: 4.2 10*3/uL (ref 1.4–7.7)
Neutrophils Relative %: 66.1 % (ref 43.0–77.0)
Platelets: 272 10*3/uL (ref 150.0–400.0)
RBC: 4.84 Mil/uL (ref 3.87–5.11)
RDW: 13 % (ref 11.5–15.5)
WBC: 6.3 10*3/uL (ref 4.0–10.5)

## 2018-06-17 LAB — COMPREHENSIVE METABOLIC PANEL
ALT: 14 U/L (ref 0–35)
AST: 18 U/L (ref 0–37)
Albumin: 4.3 g/dL (ref 3.5–5.2)
Alkaline Phosphatase: 108 U/L (ref 39–117)
BUN: 20 mg/dL (ref 6–23)
CO2: 27 mEq/L (ref 19–32)
Calcium: 10.3 mg/dL (ref 8.4–10.5)
Chloride: 105 mEq/L (ref 96–112)
Creatinine, Ser: 0.94 mg/dL (ref 0.40–1.20)
GFR: 57.93 mL/min — ABNORMAL LOW (ref >60.00)
Glucose, Bld: 101 mg/dL — ABNORMAL HIGH (ref 70–99)
Potassium: 4.1 mEq/L (ref 3.5–5.1)
Sodium: 140 mEq/L (ref 135–145)
Total Bilirubin: 0.5 mg/dL (ref 0.2–1.2)
Total Protein: 7 g/dL (ref 6.0–8.3)

## 2018-06-17 MED ORDER — HYOSCYAMINE SULFATE 0.125 MG PO TABS: 0.1250 mg | ORAL_TABLET | Freq: Three times a day (TID) | ORAL | 1 refills | Status: DC | PRN

## 2018-06-17 NOTE — Patient Instructions (Addendum)
I'll await the notes from ortho tomorrow.   Go to the lab on the way out.  We'll contact you with your lab report.  Restart hyoscyamine.   If you aren't better, then we need to set you up with GI.   Take care.  Glad to see you.

## 2018-06-17 NOTE — Progress Notes (Signed)
She had mult joint injections per ortho.  She did well on her trip to [place].    She has more hip pain radiating to the R>L groin.  She can't walk through the grocery store due to pain.  She has ortho f/u pending for tomorrow.  She has pain laying on her R side, pain bending to put her socks on.  Sleep is disrupted.    She tried keto diet and lost about 10 lbs last year.  She gained weight after her trip.  She has fecal urgency with diarrhea and "stomach rolling and gas."  Stools have been either runny loose. She added on benefiber recently .  She had seen GI prev but not recently.    She has similar sx during periods of high stress when her husband was sick. She tried hyoscyamine again recently and it helped.  Fried foods makes it worse.    No black stools.  No blood in stool.  No FCNAV.    Meds, vitals, and allergies reviewed.   ROS: Per HPI unless specifically indicated in ROS section   GEN: nad, alert and oriented HEENT: mucous membranes moist NECK: supple w/o LA CV: rrr PULM: ctab, no inc wob ABD: soft, +bs, no rebound.  Nontender. EXT: no edema SKIN: Well-perfused She has hip pain on walking with pain on internal rotation of hips bilaterally.

## 2018-06-18 DIAGNOSIS — M1611 Unilateral primary osteoarthritis, right hip: Secondary | ICD-10-CM | POA: Diagnosis not present

## 2018-06-18 DIAGNOSIS — M25561 Pain in right knee: Secondary | ICD-10-CM | POA: Diagnosis not present

## 2018-06-18 DIAGNOSIS — M25551 Pain in right hip: Secondary | ICD-10-CM | POA: Diagnosis not present

## 2018-06-18 DIAGNOSIS — M25552 Pain in left hip: Secondary | ICD-10-CM | POA: Diagnosis not present

## 2018-06-18 DIAGNOSIS — M1612 Unilateral primary osteoarthritis, left hip: Secondary | ICD-10-CM | POA: Diagnosis not present

## 2018-06-20 DIAGNOSIS — M2550 Pain in unspecified joint: Secondary | ICD-10-CM | POA: Insufficient documentation

## 2018-06-20 DIAGNOSIS — R197 Diarrhea, unspecified: Secondary | ICD-10-CM | POA: Insufficient documentation

## 2018-06-20 NOTE — Assessment & Plan Note (Signed)
She is going to follow-up with orthopedics and I will await those notes.

## 2018-06-20 NOTE — Assessment & Plan Note (Signed)
This could be multifactorial.  Nontoxic.  Still okay for outpatient follow-up.  Check routine labs today  Noted that she had similar symptoms when her husband was sick.  She does have recent stressors noted with significant joint pain and it is unclear how much that contributes.  I presume, as does the patient, that it does contribute some. Restart hyoscyamine.  Discussed healthy diet in the meantime. If not better, then we can refer to GI.   She agrees and she will update [place] as needed.

## 2018-06-22 ENCOUNTER — Telehealth: Payer: Self-pay

## 2018-06-22 NOTE — Telephone Encounter (Signed)
Pt left v/m; pt seeing Dr Reynaldo Minium about hip pain and possible surgery; pt said cannot get hip surgery until April. Pt is in a lot of pain. Pt is taking pain med from Dr Reynaldo Minium and wants to know if Dr Damita Dunnings thinks OK to take CBD. Pt request cb.

## 2018-06-22 NOTE — Telephone Encounter (Signed)
I can't give a firm recommendation one way or the other.  It may or may not help.  We don't have a lot of high quality evidence to recommend it.  I'll defer to her.  This is the advice I give patients when they ask about this.  Thanks.

## 2018-06-22 NOTE — Telephone Encounter (Signed)
Patient advised.

## 2018-07-15 NOTE — Telephone Encounter (Signed)
Pt need clearance form filled out so Dr Reynaldo Minium can do her surgery next week. Pt stated a from was sent last week but they didn't receive it back from Dr Damita Dunnings. Pt also stated Dr Reynaldo Minium office will sent another one today and they need it back by tomorrow. Please advise pt when form is completed

## 2018-07-15 NOTE — Telephone Encounter (Signed)
I haven't finished all of the paper work that recent came in, since I was out of clinic due to an illness. I'll check on it.   Thanks.

## 2018-07-16 ENCOUNTER — Encounter: Payer: Self-pay | Admitting: *Deleted

## 2018-07-16 NOTE — Telephone Encounter (Signed)
Note faxed with form.

## 2018-07-16 NOTE — Telephone Encounter (Addendum)
Please send this note to ortho along with the form.    She has a history of controlled hypertension but no history of coronary disease.  It would appear that she is appropriately low risk for surgery given the level of pain she is having currently and the significant improvement she could get from surgery.    The standard form from the orthopedic clinic request that she have a general physical/etc prior to surgery. Given my recent time out of work due to unforeseen illness and the reduction in clinic hours due to inclement weather, we are not able to get her scheduled for a routine physical by the end of the day today.  If orthopedics does truly require this, then they need to notify the patient, push back her scheduled surgery, and then schedule her here for a physical.  At this point, it does not appear to me that she needs to have a routine physical done.  I will defer preop laboratory evaluation to anesthesia and orthopedics.  Thanks.

## 2018-07-19 ENCOUNTER — Other Ambulatory Visit: Payer: Self-pay

## 2018-07-19 ENCOUNTER — Encounter (HOSPITAL_COMMUNITY): Payer: Self-pay

## 2018-07-19 ENCOUNTER — Encounter (HOSPITAL_COMMUNITY)
Admission: RE | Admit: 2018-07-19 | Discharge: 2018-07-19 | Disposition: A | Discharge status: Home / Self Care | Payer: PPO | Source: Ambulatory Visit | Attending: Orthopedic Surgery | Admitting: Orthopedic Surgery

## 2018-07-19 DIAGNOSIS — Z96652 Presence of left artificial knee joint: Secondary | ICD-10-CM | POA: Diagnosis present

## 2018-07-19 DIAGNOSIS — F419 Anxiety disorder, unspecified: Secondary | ICD-10-CM | POA: Diagnosis present

## 2018-07-19 DIAGNOSIS — Z961 Presence of intraocular lens: Secondary | ICD-10-CM | POA: Diagnosis present

## 2018-07-19 DIAGNOSIS — Z01818 Encounter for other preprocedural examination: Secondary | ICD-10-CM

## 2018-07-19 DIAGNOSIS — Z8249 Family history of ischemic heart disease and other diseases of the circulatory system: Secondary | ICD-10-CM | POA: Diagnosis not present

## 2018-07-19 DIAGNOSIS — Z6837 Body mass index (BMI) 37.0-37.9, adult: Secondary | ICD-10-CM | POA: Diagnosis not present

## 2018-07-19 DIAGNOSIS — M169 Osteoarthritis of hip, unspecified: Secondary | ICD-10-CM | POA: Diagnosis not present

## 2018-07-19 DIAGNOSIS — Z96641 Presence of right artificial hip joint: Secondary | ICD-10-CM | POA: Diagnosis not present

## 2018-07-19 DIAGNOSIS — E7800 Pure hypercholesterolemia, unspecified: Secondary | ICD-10-CM | POA: Diagnosis present

## 2018-07-19 DIAGNOSIS — K589 Irritable bowel syndrome without diarrhea: Secondary | ICD-10-CM | POA: Diagnosis present

## 2018-07-19 DIAGNOSIS — Z9049 Acquired absence of other specified parts of digestive tract: Secondary | ICD-10-CM | POA: Diagnosis not present

## 2018-07-19 DIAGNOSIS — K219 Gastro-esophageal reflux disease without esophagitis: Secondary | ICD-10-CM | POA: Diagnosis present

## 2018-07-19 DIAGNOSIS — Z79891 Long term (current) use of opiate analgesic: Secondary | ICD-10-CM | POA: Diagnosis not present

## 2018-07-19 DIAGNOSIS — E785 Hyperlipidemia, unspecified: Secondary | ICD-10-CM | POA: Diagnosis present

## 2018-07-19 DIAGNOSIS — Z885 Allergy status to narcotic agent status: Secondary | ICD-10-CM | POA: Diagnosis not present

## 2018-07-19 DIAGNOSIS — I10 Essential (primary) hypertension: Secondary | ICD-10-CM | POA: Diagnosis present

## 2018-07-19 DIAGNOSIS — Z79899 Other long term (current) drug therapy: Secondary | ICD-10-CM | POA: Diagnosis not present

## 2018-07-19 DIAGNOSIS — M25751 Osteophyte, right hip: Secondary | ICD-10-CM | POA: Diagnosis present

## 2018-07-19 DIAGNOSIS — F329 Major depressive disorder, single episode, unspecified: Secondary | ICD-10-CM | POA: Diagnosis present

## 2018-07-19 DIAGNOSIS — K760 Fatty (change of) liver, not elsewhere classified: Secondary | ICD-10-CM | POA: Diagnosis present

## 2018-07-19 DIAGNOSIS — M1611 Unilateral primary osteoarthritis, right hip: Secondary | ICD-10-CM | POA: Diagnosis present

## 2018-07-19 DIAGNOSIS — E669 Obesity, unspecified: Secondary | ICD-10-CM | POA: Diagnosis present

## 2018-07-19 DIAGNOSIS — Z9841 Cataract extraction status, right eye: Secondary | ICD-10-CM | POA: Diagnosis not present

## 2018-07-19 DIAGNOSIS — Z9842 Cataract extraction status, left eye: Secondary | ICD-10-CM | POA: Diagnosis not present

## 2018-07-19 DIAGNOSIS — Z791 Long term (current) use of non-steroidal anti-inflammatories (NSAID): Secondary | ICD-10-CM | POA: Diagnosis not present

## 2018-07-19 DIAGNOSIS — Z471 Aftercare following joint replacement surgery: Secondary | ICD-10-CM | POA: Diagnosis not present

## 2018-07-19 DIAGNOSIS — G4730 Sleep apnea, unspecified: Secondary | ICD-10-CM | POA: Diagnosis present

## 2018-07-19 HISTORY — DX: Pneumonia, unspecified organism: J18.9

## 2018-07-19 LAB — PROTIME-INR
INR: 1
Prothrombin Time: 12.7 seconds (ref 11.4–15.2)

## 2018-07-19 LAB — COMPREHENSIVE METABOLIC PANEL
ALT: 18 U/L (ref 0–44)
AST: 25 U/L (ref 15–41)
Albumin: 4.3 g/dL (ref 3.5–5.0)
Alkaline Phosphatase: 96 U/L (ref 38–126)
Anion gap: 7 (ref 5–15)
BUN: 19 mg/dL (ref 8–23)
CO2: 27 mmol/L (ref 22–32)
Calcium: 9.8 mg/dL (ref 8.9–10.3)
Chloride: 107 mmol/L (ref 98–111)
Creatinine, Ser: 0.88 mg/dL (ref 0.44–1.00)
GFR calc Af Amer: >60 mL/min (ref >60)
GFR calc non Af Amer: >60 mL/min (ref >60)
Glucose, Bld: 102 mg/dL — ABNORMAL HIGH (ref 70–99)
Potassium: 4.5 mmol/L (ref 3.5–5.1)
Sodium: 141 mmol/L (ref 135–145)
Total Bilirubin: 0.6 mg/dL (ref 0.3–1.2)
Total Protein: 7.3 g/dL (ref 6.5–8.1)

## 2018-07-19 LAB — SURGICAL PCR SCREEN
MRSA, PCR: NEGATIVE
Staphylococcus aureus: NEGATIVE

## 2018-07-19 LAB — CBC
HCT: 46.4 % — ABNORMAL HIGH (ref 36.0–46.0)
Hemoglobin: 15.4 g/dL — ABNORMAL HIGH (ref 12.0–15.0)
MCH: 31.4 pg (ref 26.0–34.0)
MCHC: 33.2 g/dL (ref 30.0–36.0)
MCV: 94.5 fL (ref 80.0–100.0)
Platelets: 216 10*3/uL (ref 150–400)
RBC: 4.91 MIL/uL (ref 3.87–5.11)
RDW: 12.8 % (ref 11.5–15.5)
WBC: 7 10*3/uL (ref 4.0–10.5)
nRBC: 0 % (ref 0.0–0.2)

## 2018-07-19 LAB — APTT: aPTT: 30 seconds (ref 24–36)

## 2018-07-19 LAB — ABO/RH: ABO/RH(D): A POS

## 2018-07-19 NOTE — H&P (Signed)
TOTAL HIP ADMISSION H&P  Patient is admitted for right total hip arthroplasty.  Subjective:  Chief Complaint: right hip pain  HPI: [name] [name], 78 y.o. female, has a history of pain and functional disability in the right hip(s) due to arthritis and patient has failed non-surgical conservative treatments for greater than 12 weeks to include corticosteriod injections and activity modification.  Onset of symptoms was gradual starting 1 years ago with rapidlly worsening course since that time.The patient noted no past surgery on the right hip(s).  Patient currently rates pain in the right hip at 10 out of 10 with activity. Patient has night pain, worsening of pain with activity and weight bearing and pain that interfers with activities of daily living. Patient has evidence of bone-on-bone arthritis centrally and slightly superior. Her joint spaces are more narrow compared to 1 year ago. There is more arthritis in the right hip than left. by imaging studies. This condition presents safety issues increasing the risk of falls. There is no current active infection.  Patient Active Problem List   Diagnosis Date Noted  . Diarrhea 06/20/2018  . Joint pain 06/20/2018  . 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 Healthcare maintenance 09/15/2017  . SUI (stress urinary incontinence, female) 08/07/2017  . Back pain 06/16/2016  . Advance care planning 01/07/2016  . Sleep apnea   . 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 Allergic rhinitis 11/24/2014  . Adult BMI 30+ 11/24/2014  . Depression 11/24/2014  . Headache, migraine 05/10/2009  . HERPES SIMPLEX INFECTION 08/14/2006  . HYPERCHOLESTEROLEMIA 08/14/2006  . Anxiety state 08/14/2006  . Essential hypertension 08/14/2006  . HIATAL HERNIA 08/14/2006  . IRRITABLE BOWEL SYNDROME 08/14/2006  . 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 Primary osteoarthritis of right knee 08/14/2006  . PLANTAR FASCIITIS  08/14/2006   Past Medical History:  Diagnosis Date  . Arthritis    knees  . Depression   . Fatty liver   . GERD (gastroesophageal reflux disease)   . Hyperlipidemia   . Hypertension   . IBS (irritable bowel syndrome)   . Obesity   . Pneumonia   . Sleep apnea    uses CPAP    Past Surgical History:  Procedure Laterality Date  . BLADDER SURGERY    . BREAST SURGERY     breast biopsy  . BROW LIFT Bilateral 04/14/2017   Procedure: BLEPHAROPLASTY UPPER EYELID WITH EXCESS SKIN;  Surgeon: Karle Starch, MD;  Location: [facility];  Service: Ophthalmology;  Laterality: Bilateral;  . CATARACT EXTRACTION W/PHACO Left 02/05/2016   Procedure: CATARACT EXTRACTION PHACO AND INTRAOCULAR LENS PLACEMENT (IOC);  Surgeon: Birder Robson, MD;  Location: [facility];  Service: Ophthalmology;  Laterality: Left;  [place] 01:10 AP% 22.3 CDE 15.67 Fluid pack lot # [id number]  . CATARACT EXTRACTION W/PHACO Right 02/26/2016   Procedure: CATARACT EXTRACTION PHACO AND INTRAOCULAR LENS PLACEMENT (IOC);  Surgeon: Birder Robson, MD;  Location: [facility];  Service: Ophthalmology;  Laterality: Right;  [place] 57.4 AP% 24.0 CDE 13.75 Fluid Pack lot # [id number]  . CHOLECYSTECTOMY    . COLONOSCOPY WITH PROPOFOL N/A 12/18/2014   Procedure: COLONOSCOPY WITH PROPOFOL;  Surgeon: Manya Silvas, MD;  [facility];  Service: Endoscopy;  Laterality: N/A;  . DEEP NECK LYMPH NODE BIOPSY / EXCISION    . JOINT REPLACEMENT    . KNEE ARTHROPLASTY Left 06/20/2015   Procedure: COMPUTER ASSISTED TOTAL KNEE ARTHROPLASTY;  Surgeon: Dereck Leep, MD;  Location: [facility];  Service: Orthopedics;  Laterality: Left;  . KNEE SURGERY    . PTOSIS REPAIR Bilateral 04/14/2017   Procedure: PTOSIS REPAIR RESECT EX;  Surgeon: Karle Starch, MD;  Location: [place];  Service: Ophthalmology;  Laterality: Bilateral;  sleep apnea  . TONSILLECTOMY      No current facility-administered medications for this encounter.     Current Outpatient Medications  Medication Sig Dispense Refill Last Dose  . acetaminophen (TYLENOL) 500 MG tablet Take 500 mg by mouth every 6 (six) hours as needed for moderate pain.    Taking  . atenolol (TENORMIN) 50 MG tablet Take 1 tablet (50 mg total) by mouth 2 (two) times daily. 180 tablet 3 Taking  . Biotin 10000 MCG TABS Take 10,000 mcg by mouth daily.   Taking  . cholecalciferol (VITAMIN D) 1000 UNITS tablet Take 1,000 Units by mouth daily.    Taking  . clobetasol ointment (TEMOVATE) 7.09 % Apply 1 application topically daily as needed (for itching).   Taking  . fluticasone (FLONASE) 50 MCG/ACT nasal spray Place 2 sprays into both nostrils daily as needed for allergies or rhinitis.     . Glucosamine-Chondroit-Vit C-Mn (GLUCOSAMINE CHONDR 1500 COMPLX PO) Take 1 tablet by mouth 2 (two) times daily.   Taking  . hyoscyamine (LEVSIN, ANASPAZ) 0.125 MG tablet Take 1 tablet (0.125 mg total) by mouth 3 (three) times daily as needed. 100 tablet 1   . loperamide (IMODIUM) 2 MG capsule Take 2 mg by mouth as needed for diarrhea or loose stools.   Taking  . meloxicam (MOBIC) 15 MG tablet Take 15 mg by mouth daily.   Taking  . traMADol (ULTRAM) 50 MG tablet Take 50 mg by mouth 2 (two) times daily as needed for moderate pain. Take 1 or 2 tablets a day as needed for pain.    Taking   Allergies  Allergen Reactions  . Hydrocodone Nausea Only    Noted after surgery    Social History   Tobacco Use  . Smoking status: Never Smoker  . Smokeless tobacco: Never Used  Substance Use Topics  . Alcohol use: Yes    Comment: ocassional glass of wine 2-3 times a year    Family History  Problem Relation Age of Onset  . Heart attack Mother   . Dementia Father   . Aortic aneurysm Brother   . Colon cancer Neg Hx   . Breast cancer Neg Hx      ROS  CONSTITUTIONAL: no Fever, no Chills, no Night Sweats, no Weight Loss  CARDIOVASCULAR: no Cough, no Shortness of Breath, no COPD, no  Asthma  GASTROINTESTINAL: no Vomiting, no Nausea  MUSCULOSKELETAL: JOINT PAIN, but no Swelling in Joints  NEUROLOGIC: no Numbness, no Tingling/Paresthesias, no Difficulty with Balance  Objective:  Physical Exam  Well nourished and well developed.  General: Alert and oriented x3, cooperative and pleasant, no acute distress. Head: normocephalic, atraumatic, neck supple. Eyes: EOMI. Respiratory: breath sounds clear in all fields, no wheezing, rales, or rhonchi. Cardiovascular: Regular rate and rhythm, no murmurs, gallops or rubs.  Abdomen: non-tender to palpation and soft, normoactive bowel sounds. Musculoskeletal: Right Hip Exam: ROM: Flexion to 110, Internal Rotation 20, External Rotation 30, and Abduction 30 with pain and discomfort.  There is no tenderness over the greater trochanter bursa.  Left Hip Exam: ROM: Flexion to 120, Internal Rotation 30, External Rotation 40, and Abduction 40 without pain and discomfort.  There is no tenderness over the greater trochanter bursa.  There  is no pain on provocative testing of the hip.  Calves soft and nontender. Motor function intact in LE. Strength 5/5 LE bilaterally.  Neuro: Distal pulses 2+. Sensation to light touch intact in LE.   Vital signs in last 24 hours: Temp:  [98.3 F (36.8 C)] 98.3 F (36.8 C) (02/24 0900) Pulse Rate:  [66] 66 (02/24 0900) Resp:  [16] 16 (02/24 0900) SpO2:  [97 %] 97 % (02/24 0900) Weight:  [78.9 kg] 78.9 kg (02/24 0900)  Labs:   Estimated body mass index is 35.14 kg/m as calculated from the following:   Height as of 07/19/18: 4\' 11"  (1.499 m).   Weight as of 07/19/18: 78.9 kg.   Imaging Review Plain radiographs demonstrate severe degenerative joint disease of the right hip(s). The bone quality appears to be adequate for age and reported activity level.      Assessment/Plan:  End stage arthritis, right hip(s)  The patient history, physical examination, clinical judgement of the provider  and imaging studies are consistent with end stage degenerative joint disease of the right hip(s) and total hip arthroplasty is deemed medically necessary. The treatment options including medical management, injection therapy, arthroscopy and arthroplasty were discussed at length. The risks and benefits of total hip arthroplasty were presented and reviewed. The risks due to aseptic loosening, infection, stiffness, dislocation/subluxation,  thromboembolic complications and other imponderables were discussed.  The patient acknowledged the explanation, agreed to proceed with the plan and consent was signed. Patient is being admitted for inpatient treatment for surgery, pain control, PT, OT, prophylactic antibiotics, VTE prophylaxis, progressive ambulation and ADL's and discharge planning.The patient is planning to be discharged home.   Therapy Plans: HHPT Disposition: Home with daughter Planned DVT Prophylaxis: aspirin 325mg  BID DME needed: none PCP: Dr. Elsie Stain TXA: IV Allergies: NKDA Anesthesia Concerns: none BMI: 37.5   Other: Nausea/vomiting with hydrocodone in the past.   - Patient was instructed on what medications to stop prior to surgery. - Follow-up visit in 2 weeks with Dr. Wynelle Link - Begin physical therapy following surgery - Pre-operative lab work as pre-surgical testing - Prescriptions will be provided in hospital at time of discharge  Griffith Citron, PA-C Orthopedic Surgery [facility] [place]

## 2018-07-19 NOTE — Patient Instructions (Signed)
[name] [name]  07/19/2018   Your procedure is scheduled on: 07/21/2018   Report to [facility] Main  Entrance  Report to Short Stay  [phone] AM    Call this number if you have problems the morning of surgery [phone]   Remember: Do not eat food or drink liquids :After Midnight. BRUSH YOUR TEETH MORNING OF SURGERY AND RINSE YOUR MOUTH OUT, NO CHEWING GUM CANDY OR MINTS.     Take these medicines the morning of surgery with A SIP OF WATER: Atentolol, Flonase if needed                                 You may not have any metal on your body including hair pins and              piercings  Do not wear jewelry, make-up, lotions, powders or perfumes, deodorant             Do not wear nail polish.  Do not shave  48 hours prior to surgery.               Do not bring valuables to the hospital. [place].  Contacts, dentures or bridgework may not be worn into surgery.  Leave suitcase in the car. After surgery it may be brought to your room.                Please read over the following fact sheets you were given: _____________________________________________________________________             [facility] - Preparing for Surgery Before surgery, you can play an important role.  Because skin is not sterile, your skin needs to be as free of germs as possible.  You can reduce the number of germs on your skin by washing with CHG (chlorahexidine gluconate) soap before surgery.  CHG is an antiseptic cleaner which kills germs and bonds with the skin to continue killing germs even after washing. Please DO NOT use if you have an allergy to CHG or antibacterial soaps.  If your skin becomes reddened/irritated stop using the CHG and inform your nurse when you arrive at Short Stay. Do not shave (including legs and underarms) for at least 48 hours prior to the first CHG shower.  You may shave your face/neck. Please follow these  instructions carefully:  1.  Shower with CHG Soap the night before surgery and the  morning of Surgery.  2.  If you choose to wash your hair, wash your hair first as usual with your  normal  shampoo.  3.  After you shampoo, rinse your hair and body thoroughly to remove the  shampoo.                           4.  Use CHG as you would any other liquid soap.  You can apply chg directly  to the skin and wash                       Gently with a scrungie or clean washcloth.  5.  Apply the CHG Soap to your body ONLY FROM THE NECK DOWN.   Do  not use on face/ open                           Wound or open sores. Avoid contact with eyes, ears mouth and genitals (private parts).                       Wash face,  Genitals (private parts) with your normal soap.             6.  Wash thoroughly, paying special attention to the area where your surgery  will be performed.  7.  Thoroughly rinse your body with warm water from the neck down.  8.  DO NOT shower/wash with your normal soap after using and rinsing off  the CHG Soap.                9.  Pat yourself dry with a clean towel.            10.  Wear clean pajamas.            11.  Place clean sheets on your bed the night of your first shower and do not  sleep with pets. Day of Surgery : Do not apply any lotions/deodorants the morning of surgery.  Please wear clean clothes to the hospital/surgery center.  FAILURE TO FOLLOW THESE INSTRUCTIONS MAY RESULT IN THE CANCELLATION OF YOUR SURGERY PATIENT SIGNATURE_________________________________  NURSE SIGNATURE__________________________________  ________________________________________________________________________  WHAT IS A BLOOD TRANSFUSION? Blood Transfusion Information  A transfusion is the replacement of blood or some of its parts. Blood is made up of multiple cells which provide different functions.  Red blood cells carry oxygen and are used for blood loss replacement.  White blood cells fight against  infection.  Platelets control bleeding.  Plasma helps clot blood.  Other blood products are available for specialized needs, such as hemophilia or other clotting disorders. BEFORE THE TRANSFUSION  Who gives blood for transfusions?   Healthy volunteers who are fully evaluated to make sure their blood is safe. This is blood bank blood. Transfusion therapy is the safest it has ever been in the practice of medicine. Before blood is taken from a donor, a complete history is taken to make sure that person has no history of diseases nor engages in risky social behavior (examples are intravenous drug use or sexual activity with multiple partners). The donor's travel history is screened to minimize risk of transmitting infections, such as malaria. The donated blood is tested for signs of infectious diseases, such as HIV and hepatitis. The blood is then tested to be sure it is compatible with you in order to minimize the chance of a transfusion reaction. If you or a relative donates blood, this is often done in anticipation of surgery and is not appropriate for emergency situations. It takes many days to process the donated blood. RISKS AND COMPLICATIONS Although transfusion therapy is very safe and saves many lives, the main dangers of transfusion include:   Getting an infectious disease.  Developing a transfusion reaction. This is an allergic reaction to something in the blood you were given. Every precaution is taken to prevent this. The decision to have a blood transfusion has been considered carefully by your caregiver before blood is given. Blood is not given unless the benefits outweigh the risks. AFTER THE TRANSFUSION  Right after receiving a blood transfusion, you will usually feel much better and more energetic. This is especially true  if your red blood cells have gotten low (anemic). The transfusion raises the level of the red blood cells which carry oxygen, and this usually causes an energy  increase.  The nurse administering the transfusion will monitor you carefully for complications. HOME CARE INSTRUCTIONS  No special instructions are needed after a transfusion. You may find your energy is better. Speak with your caregiver about any limitations on activity for underlying diseases you may have. SEEK MEDICAL CARE IF:   Your condition is not improving after your transfusion.  You develop redness or irritation at the intravenous (IV) site. SEEK IMMEDIATE MEDICAL CARE IF:  Any of the following symptoms occur over the next 12 hours:  Shaking chills.  You have a temperature by mouth above 102 F (38.9 C), not controlled by medicine.  Chest, back, or muscle pain.  People around you feel you are not acting correctly or are confused.  Shortness of breath or difficulty breathing.  Dizziness and fainting.  You get a rash or develop hives.  You have a decrease in urine output.  Your urine turns a dark color or changes to pink, red, or brown. Any of the following symptoms occur over the next 10 days:  You have a temperature by mouth above 102 F (38.9 C), not controlled by medicine.  Shortness of breath.  Weakness after normal activity.  The white part of the eye turns yellow (jaundice).  You have a decrease in the amount of urine or are urinating less often.  Your urine turns a dark color or changes to pink, red, or brown. Document Released: 05/09/2000 Document Revised: 08/04/2011 Document Reviewed: 12/27/2007 ExitCare Patient Information 2014 [place].  _______________________________________________________________________  Incentive Spirometer  An incentive spirometer is a tool that can help keep your lungs clear and active. This tool measures how well you are filling your lungs with each breath. Taking long deep breaths may help reverse or decrease the chance of developing breathing (pulmonary) problems (especially infection) following:  A long  period of time when you are unable to move or be active. BEFORE THE PROCEDURE   If the spirometer includes an indicator to show your best effort, your nurse or respiratory therapist will set it to a desired goal.  If possible, sit up straight or lean slightly forward. Try not to slouch.  Hold the incentive spirometer in an upright position. INSTRUCTIONS FOR USE  1. Sit on the edge of your bed if possible, or sit up as far as you can in bed or on a chair. 2. Hold the incentive spirometer in an upright position. 3. Breathe out normally. 4. Place the mouthpiece in your mouth and seal your lips tightly around it. 5. Breathe in slowly and as deeply as possible, raising the piston or the ball toward the top of the column. 6. Hold your breath for 3-5 seconds or for as long as possible. Allow the piston or ball to fall to the bottom of the column. 7. Remove the mouthpiece from your mouth and breathe out normally. 8. Rest for a few seconds and repeat Steps 1 through 7 at least 10 times every 1-2 hours when you are awake. Take your time and take a few normal breaths between deep breaths. 9. The spirometer may include an indicator to show your best effort. Use the indicator as a goal to work toward during each repetition. 10. After each set of 10 deep breaths, practice coughing to be sure your lungs are clear. If you have an  incision (the cut made at the time of surgery), support your incision when coughing by placing a pillow or rolled up towels firmly against it. Once you are able to get out of bed, walk around indoors and cough well. You may stop using the incentive spirometer when instructed by your caregiver.  RISKS AND COMPLICATIONS  Take your time so you do not get dizzy or light-headed.  If you are in pain, you may need to take or ask for pain medication before doing incentive spirometry. It is harder to take a deep breath if you are having pain. AFTER USE  Rest and breathe slowly and  easily.  It can be helpful to keep track of a log of your progress. Your caregiver can provide you with a simple table to help with this. If you are using the spirometer at home, follow these instructions: [place] IF:   You are having difficultly using the spirometer.  You have trouble using the spirometer as often as instructed.  Your pain medication is not giving enough relief while using the spirometer.  You develop fever of 100.5 F (38.1 C) or higher. SEEK IMMEDIATE MEDICAL CARE IF:   You cough up bloody sputum that had not been present before.  You develop fever of 102 F (38.9 C) or greater.  You develop worsening pain at or near the incision site. MAKE SURE YOU:   Understand these instructions.  Will watch your condition.  Will get help right away if you are not doing well or get worse. Document Released: 09/22/2006 Document Revised: 08/04/2011 Document Reviewed: 11/23/2006 [facility] Patient Information 2014 [place], [place].   ________________________________________________________________________

## 2018-07-20 NOTE — Anesthesia Preprocedure Evaluation (Addendum)
Anesthesia Evaluation  Patient identified by MRN, date of birth, ID band Patient awake    Reviewed: Allergy & Precautions, NPO status , Patient's Chart, lab work & pertinent test results, reviewed documented beta blocker date and time   Airway Mallampati: III  TM Distance: >3 FB Neck ROM: Full    Dental no notable dental hx.    Pulmonary sleep apnea and Continuous Positive Airway Pressure Ventilation ,    Pulmonary exam normal breath sounds clear to auscultation       Cardiovascular hypertension, Pt. on home beta blockers Normal cardiovascular exam Rhythm:Regular Rate:Normal  NSR, rate 60   Neuro/Psych  Headaches, PSYCHIATRIC DISORDERS Anxiety Depression    GI/Hepatic Neg liver ROS, GERD  Controlled,IBS (irritable bowel syndrome)   Endo/Other  negative endocrine ROS  Renal/GU negative Renal ROS     Musculoskeletal  (+) Arthritis ,   Abdominal (+) + obese,   Peds  Hematology HLD   Anesthesia Other Findings Right hip osteoarthritis  Reproductive/Obstetrics                            Anesthesia Physical Anesthesia Plan  ASA: III  Anesthesia Plan: Spinal   Post-op Pain Management:    Induction: Intravenous  PONV Risk Score and Plan: 2 and Ondansetron, Dexamethasone, Propofol infusion and Treatment may vary due to age or medical condition  Airway Management Planned: Natural Airway  Additional Equipment:   Intra-op Plan:   Post-operative Plan:   Informed Consent: I have reviewed the patients History and Physical, chart, labs and discussed the procedure including the risks, benefits and alternatives for the proposed anesthesia with the patient or authorized representative who has indicated his/her understanding and acceptance.     Dental advisory given  Plan Discussed with: CRNA  Anesthesia Plan Comments:         Anesthesia Quick Evaluation

## 2018-07-21 ENCOUNTER — Encounter (HOSPITAL_COMMUNITY): Payer: Self-pay

## 2018-07-21 ENCOUNTER — Inpatient Hospital Stay (HOSPITAL_COMMUNITY): Payer: PPO

## 2018-07-21 ENCOUNTER — Inpatient Hospital Stay (HOSPITAL_COMMUNITY)
Admission: RE | Admit: 2018-07-21 | Discharge: 2018-07-23 | DRG: 470 | Disposition: A | Discharge status: Home Health | Payer: PPO | Attending: Orthopedic Surgery | Admitting: Orthopedic Surgery

## 2018-07-21 ENCOUNTER — Inpatient Hospital Stay (HOSPITAL_COMMUNITY): Payer: PPO | Admitting: Physician Assistant

## 2018-07-21 ENCOUNTER — Inpatient Hospital Stay (HOSPITAL_COMMUNITY): Payer: PPO | Admitting: Anesthesiology

## 2018-07-21 ENCOUNTER — Other Ambulatory Visit: Payer: Self-pay

## 2018-07-21 ENCOUNTER — Encounter (HOSPITAL_COMMUNITY)
Admission: RE | Disposition: A | Discharge status: Home Health | Payer: Self-pay | Source: Home / Self Care | Attending: Orthopedic Surgery

## 2018-07-21 DIAGNOSIS — Z9841 Cataract extraction status, right eye: Secondary | ICD-10-CM | POA: Diagnosis not present

## 2018-07-21 DIAGNOSIS — Z96652 Presence of left artificial knee joint: Secondary | ICD-10-CM | POA: Diagnosis present

## 2018-07-21 DIAGNOSIS — Z79891 Long term (current) use of opiate analgesic: Secondary | ICD-10-CM

## 2018-07-21 DIAGNOSIS — E785 Hyperlipidemia, unspecified: Secondary | ICD-10-CM | POA: Diagnosis present

## 2018-07-21 DIAGNOSIS — Z885 Allergy status to narcotic agent status: Secondary | ICD-10-CM

## 2018-07-21 DIAGNOSIS — M25751 Osteophyte, right hip: Secondary | ICD-10-CM | POA: Diagnosis present

## 2018-07-21 DIAGNOSIS — Z419 Encounter for procedure for purposes other than remedying health state, unspecified: Secondary | ICD-10-CM

## 2018-07-21 DIAGNOSIS — Z6837 Body mass index (BMI) 37.0-37.9, adult: Secondary | ICD-10-CM | POA: Diagnosis not present

## 2018-07-21 DIAGNOSIS — G4730 Sleep apnea, unspecified: Secondary | ICD-10-CM | POA: Diagnosis present

## 2018-07-21 DIAGNOSIS — F419 Anxiety disorder, unspecified: Secondary | ICD-10-CM | POA: Diagnosis present

## 2018-07-21 DIAGNOSIS — K219 Gastro-esophageal reflux disease without esophagitis: Secondary | ICD-10-CM | POA: Diagnosis present

## 2018-07-21 DIAGNOSIS — Z961 Presence of intraocular lens: Secondary | ICD-10-CM | POA: Diagnosis present

## 2018-07-21 DIAGNOSIS — M1611 Unilateral primary osteoarthritis, right hip: Secondary | ICD-10-CM | POA: Diagnosis present

## 2018-07-21 DIAGNOSIS — Z471 Aftercare following joint replacement surgery: Secondary | ICD-10-CM | POA: Diagnosis not present

## 2018-07-21 DIAGNOSIS — F329 Major depressive disorder, single episode, unspecified: Secondary | ICD-10-CM | POA: Diagnosis present

## 2018-07-21 DIAGNOSIS — I10 Essential (primary) hypertension: Secondary | ICD-10-CM | POA: Diagnosis present

## 2018-07-21 DIAGNOSIS — E7800 Pure hypercholesterolemia, unspecified: Secondary | ICD-10-CM | POA: Diagnosis present

## 2018-07-21 DIAGNOSIS — E669 Obesity, unspecified: Secondary | ICD-10-CM | POA: Diagnosis present

## 2018-07-21 DIAGNOSIS — Z79899 Other long term (current) drug therapy: Secondary | ICD-10-CM | POA: Diagnosis not present

## 2018-07-21 DIAGNOSIS — M169 Osteoarthritis of hip, unspecified: Secondary | ICD-10-CM | POA: Diagnosis present

## 2018-07-21 DIAGNOSIS — Z96649 Presence of unspecified artificial hip joint: Secondary | ICD-10-CM

## 2018-07-21 DIAGNOSIS — K760 Fatty (change of) liver, not elsewhere classified: Secondary | ICD-10-CM | POA: Diagnosis present

## 2018-07-21 DIAGNOSIS — Z96641 Presence of right artificial hip joint: Secondary | ICD-10-CM | POA: Diagnosis not present

## 2018-07-21 DIAGNOSIS — Z791 Long term (current) use of non-steroidal anti-inflammatories (NSAID): Secondary | ICD-10-CM | POA: Diagnosis not present

## 2018-07-21 DIAGNOSIS — Z9842 Cataract extraction status, left eye: Secondary | ICD-10-CM

## 2018-07-21 DIAGNOSIS — Z8249 Family history of ischemic heart disease and other diseases of the circulatory system: Secondary | ICD-10-CM

## 2018-07-21 DIAGNOSIS — K589 Irritable bowel syndrome without diarrhea: Secondary | ICD-10-CM | POA: Diagnosis present

## 2018-07-21 DIAGNOSIS — Z9049 Acquired absence of other specified parts of digestive tract: Secondary | ICD-10-CM | POA: Diagnosis not present

## 2018-07-21 HISTORY — PX: TOTAL HIP ARTHROPLASTY: SHX124

## 2018-07-21 LAB — TYPE AND SCREEN
ABO/RH(D): A POS
Antibody Screen: NEGATIVE

## 2018-07-21 SURGERY — ARTHROPLASTY, HIP, TOTAL, ANTERIOR APPROACH
Anesthesia: Spinal | Laterality: Right

## 2018-07-21 MED ORDER — LIDOCAINE 2% (20 MG/ML) 5 ML SYRINGE
INTRAMUSCULAR | Status: AC
  Filled 2018-07-21: qty 5

## 2018-07-21 MED ORDER — METHOCARBAMOL 500 MG IVPB - SIMPLE MED
500.0000 mg | Freq: Four times a day (QID) | INTRAVENOUS | Status: DC | PRN
  Administered 2018-07-21: 500 mg via INTRAVENOUS
  Filled 2018-07-21: qty 50

## 2018-07-21 MED ORDER — ACETAMINOPHEN 500 MG PO TABS: 1000.0000 mg | ORAL_TABLET | Freq: Once | ORAL | Status: DC

## 2018-07-21 MED ORDER — FLEET ENEMA 7-19 GM/118ML RE ENEM: 1.0000 | ENEMA | Freq: Once | RECTAL | Status: DC | PRN

## 2018-07-21 MED ORDER — ACETAMINOPHEN 500 MG PO TABS
500.0000 mg | ORAL_TABLET | Freq: Four times a day (QID) | ORAL | Status: AC
  Administered 2018-07-21 – 2018-07-22 (×4): 500 mg via ORAL
  Filled 2018-07-21 (×4): qty 1

## 2018-07-21 MED ORDER — METHOCARBAMOL 500 MG PO TABS
500.0000 mg | ORAL_TABLET | Freq: Four times a day (QID) | ORAL | Status: DC | PRN
  Administered 2018-07-21 – 2018-07-23 (×5): 500 mg via ORAL
  Filled 2018-07-21 (×5): qty 1

## 2018-07-21 MED ORDER — KETOROLAC TROMETHAMINE 15 MG/ML IJ SOLN: 15.0000 mg | Freq: Once | INTRAMUSCULAR | Status: DC | PRN

## 2018-07-21 MED ORDER — PROPOFOL 10 MG/ML IV BOLUS
INTRAVENOUS | Status: DC | PRN
  Administered 2018-07-21: 20 mg via INTRAVENOUS

## 2018-07-21 MED ORDER — 0.9 % SODIUM CHLORIDE (POUR BTL) OPTIME
TOPICAL | Status: DC | PRN
  Administered 2018-07-21: 1000 mL

## 2018-07-21 MED ORDER — DEXAMETHASONE SODIUM PHOSPHATE 10 MG/ML IJ SOLN
8.0000 mg | Freq: Once | INTRAMUSCULAR | Status: AC
  Administered 2018-07-21: 10 mg via INTRAVENOUS

## 2018-07-21 MED ORDER — DEXAMETHASONE SODIUM PHOSPHATE 10 MG/ML IJ SOLN
INTRAMUSCULAR | Status: AC
  Filled 2018-07-21: qty 1

## 2018-07-21 MED ORDER — FENTANYL CITRATE (PF) 100 MCG/2ML IJ SOLN: 25.0000 ug | INTRAMUSCULAR | Status: DC | PRN

## 2018-07-21 MED ORDER — TRANEXAMIC ACID-NACL 1000-0.7 MG/100ML-% IV SOLN
1000.0000 mg | Freq: Once | INTRAVENOUS | Status: AC
  Administered 2018-07-21: 1000 mg via INTRAVENOUS
  Filled 2018-07-21: qty 100

## 2018-07-21 MED ORDER — BISACODYL 10 MG RE SUPP: 10.0000 mg | Freq: Every day | RECTAL | Status: DC | PRN

## 2018-07-21 MED ORDER — HYOSCYAMINE SULFATE 0.125 MG PO TABS
0.1250 mg | ORAL_TABLET | Freq: Three times a day (TID) | ORAL | Status: DC | PRN
  Filled 2018-07-21: qty 1

## 2018-07-21 MED ORDER — PHENOL 1.4 % MT LIQD
1.0000 | OROMUCOSAL | Status: DC | PRN
  Filled 2018-07-21: qty 177

## 2018-07-21 MED ORDER — ONDANSETRON HCL 4 MG/2ML IJ SOLN
INTRAMUSCULAR | Status: AC
  Filled 2018-07-21: qty 2

## 2018-07-21 MED ORDER — MENTHOL 3 MG MT LOZG: 1.0000 | LOZENGE | OROMUCOSAL | Status: DC | PRN

## 2018-07-21 MED ORDER — LOPERAMIDE HCL 2 MG PO CAPS: 2.0000 mg | ORAL_CAPSULE | ORAL | Status: DC | PRN

## 2018-07-21 MED ORDER — ONDANSETRON HCL 4 MG PO TABS: 4.0000 mg | ORAL_TABLET | Freq: Four times a day (QID) | ORAL | Status: DC | PRN

## 2018-07-21 MED ORDER — STERILE WATER FOR IRRIGATION IR SOLN
Status: DC | PRN
  Administered 2018-07-21: 2000 mL

## 2018-07-21 MED ORDER — DOCUSATE SODIUM 100 MG PO CAPS
100.0000 mg | ORAL_CAPSULE | Freq: Two times a day (BID) | ORAL | Status: DC
  Administered 2018-07-21 – 2018-07-23 (×4): 100 mg via ORAL
  Filled 2018-07-21 (×4): qty 1

## 2018-07-21 MED ORDER — CEFAZOLIN SODIUM-DEXTROSE 2-4 GM/100ML-% IV SOLN
2.0000 g | Freq: Four times a day (QID) | INTRAVENOUS | Status: AC
  Administered 2018-07-21 (×2): 2 g via INTRAVENOUS
  Filled 2018-07-21 (×2): qty 100

## 2018-07-21 MED ORDER — FLUTICASONE PROPIONATE 50 MCG/ACT NA SUSP
2.0000 | Freq: Every day | NASAL | Status: DC | PRN
  Filled 2018-07-21: qty 16

## 2018-07-21 MED ORDER — POLYETHYLENE GLYCOL 3350 17 G PO PACK: 17.0000 g | PACK | Freq: Every day | ORAL | Status: DC | PRN

## 2018-07-21 MED ORDER — BUPIVACAINE HCL (PF) 0.25 % IJ SOLN
INTRAMUSCULAR | Status: DC | PRN
  Administered 2018-07-21: 30 mL

## 2018-07-21 MED ORDER — BUPIVACAINE HCL (PF) 0.25 % IJ SOLN
INTRAMUSCULAR | Status: AC
  Filled 2018-07-21: qty 30

## 2018-07-21 MED ORDER — METOCLOPRAMIDE HCL 5 MG/ML IJ SOLN: 5.0000 mg | Freq: Three times a day (TID) | INTRAMUSCULAR | Status: DC | PRN

## 2018-07-21 MED ORDER — PROPOFOL 10 MG/ML IV BOLUS
INTRAVENOUS | Status: AC
  Filled 2018-07-21: qty 80

## 2018-07-21 MED ORDER — PHENYLEPHRINE HCL 10 MG/ML IJ SOLN
INTRAMUSCULAR | Status: AC
  Filled 2018-07-21: qty 2

## 2018-07-21 MED ORDER — LIDOCAINE HCL (CARDIAC) PF 100 MG/5ML IV SOSY
PREFILLED_SYRINGE | INTRAVENOUS | Status: DC | PRN
  Administered 2018-07-21: 60 mg via INTRAVENOUS

## 2018-07-21 MED ORDER — ASPIRIN EC 325 MG PO TBEC
325.0000 mg | DELAYED_RELEASE_TABLET | Freq: Two times a day (BID) | ORAL | Status: DC
  Administered 2018-07-22 – 2018-07-23 (×3): 325 mg via ORAL
  Filled 2018-07-21 (×3): qty 1

## 2018-07-21 MED ORDER — MORPHINE SULFATE (PF) 4 MG/ML IV SOLN: 0.5000 mg | INTRAVENOUS | Status: DC | PRN

## 2018-07-21 MED ORDER — ACETAMINOPHEN 10 MG/ML IV SOLN
1000.0000 mg | Freq: Four times a day (QID) | INTRAVENOUS | Status: DC
  Administered 2018-07-21: 1000 mg via INTRAVENOUS
  Filled 2018-07-21: qty 100

## 2018-07-21 MED ORDER — DEXAMETHASONE SODIUM PHOSPHATE 10 MG/ML IJ SOLN
10.0000 mg | Freq: Once | INTRAMUSCULAR | Status: AC
  Administered 2018-07-22: 10 mg via INTRAVENOUS
  Filled 2018-07-21: qty 1

## 2018-07-21 MED ORDER — ATENOLOL 50 MG PO TABS
50.0000 mg | ORAL_TABLET | Freq: Two times a day (BID) | ORAL | Status: DC
  Administered 2018-07-21 – 2018-07-23 (×4): 50 mg via ORAL
  Filled 2018-07-21 (×5): qty 1

## 2018-07-21 MED ORDER — DIPHENHYDRAMINE HCL 12.5 MG/5ML PO ELIX: 12.5000 mg | ORAL_SOLUTION | ORAL | Status: DC | PRN

## 2018-07-21 MED ORDER — TRANEXAMIC ACID-NACL 1000-0.7 MG/100ML-% IV SOLN
1000.0000 mg | INTRAVENOUS | Status: AC
  Administered 2018-07-21: 1000 mg via INTRAVENOUS
  Filled 2018-07-21: qty 100

## 2018-07-21 MED ORDER — LACTATED RINGERS IV SOLN: INTRAVENOUS | Status: DC

## 2018-07-21 MED ORDER — FENTANYL CITRATE (PF) 100 MCG/2ML IJ SOLN
INTRAMUSCULAR | Status: AC
  Filled 2018-07-21: qty 2

## 2018-07-21 MED ORDER — FENTANYL CITRATE (PF) 100 MCG/2ML IJ SOLN
INTRAMUSCULAR | Status: DC | PRN
  Administered 2018-07-21: 100 ug via INTRAVENOUS

## 2018-07-21 MED ORDER — METHOCARBAMOL 500 MG IVPB - SIMPLE MED
INTRAVENOUS | Status: AC
  Filled 2018-07-21: qty 50

## 2018-07-21 MED ORDER — ONDANSETRON HCL 4 MG/2ML IJ SOLN: 4.0000 mg | Freq: Once | INTRAMUSCULAR | Status: DC | PRN

## 2018-07-21 MED ORDER — OXYCODONE HCL 5 MG PO TABS
5.0000 mg | ORAL_TABLET | ORAL | Status: DC | PRN
  Administered 2018-07-21 – 2018-07-23 (×7): 5 mg via ORAL
  Filled 2018-07-21 (×8): qty 1

## 2018-07-21 MED ORDER — LACTATED RINGERS IV SOLN
INTRAVENOUS | Status: DC
  Administered 2018-07-21 (×2): via INTRAVENOUS

## 2018-07-21 MED ORDER — TRAMADOL HCL 50 MG PO TABS
50.0000 mg | ORAL_TABLET | Freq: Four times a day (QID) | ORAL | Status: DC | PRN
  Administered 2018-07-21 (×2): 100 mg via ORAL
  Filled 2018-07-21 (×2): qty 2

## 2018-07-21 MED ORDER — BUPIVACAINE IN DEXTROSE 0.75-8.25 % IT SOLN
INTRATHECAL | Status: DC | PRN
  Administered 2018-07-21: 1.8 mL via INTRATHECAL

## 2018-07-21 MED ORDER — ONDANSETRON HCL 4 MG/2ML IJ SOLN
4.0000 mg | Freq: Four times a day (QID) | INTRAMUSCULAR | Status: DC | PRN
  Administered 2018-07-22: 4 mg via INTRAVENOUS
  Filled 2018-07-21: qty 2

## 2018-07-21 MED ORDER — CEFAZOLIN SODIUM-DEXTROSE 2-4 GM/100ML-% IV SOLN
2.0000 g | INTRAVENOUS | Status: AC
  Administered 2018-07-21: 2 g via INTRAVENOUS
  Filled 2018-07-21: qty 100

## 2018-07-21 MED ORDER — PROPOFOL 500 MG/50ML IV EMUL
INTRAVENOUS | Status: DC | PRN
  Administered 2018-07-21: 75 ug/kg/min via INTRAVENOUS

## 2018-07-21 MED ORDER — CHLORHEXIDINE GLUCONATE 4 % EX LIQD: 60.0000 mL | Freq: Once | CUTANEOUS | Status: DC

## 2018-07-21 MED ORDER — METOCLOPRAMIDE HCL 5 MG PO TABS: 5.0000 mg | ORAL_TABLET | Freq: Three times a day (TID) | ORAL | Status: DC | PRN

## 2018-07-21 MED ORDER — ONDANSETRON HCL 4 MG/2ML IJ SOLN
INTRAMUSCULAR | Status: DC | PRN
  Administered 2018-07-21: 4 mg via INTRAVENOUS

## 2018-07-21 MED ORDER — SODIUM CHLORIDE 0.9 % IV SOLN
INTRAVENOUS | Status: DC
  Administered 2018-07-21 (×2): via INTRAVENOUS

## 2018-07-21 MED ORDER — SODIUM CHLORIDE 0.9 % IV SOLN
INTRAVENOUS | Status: DC | PRN
  Administered 2018-07-21: 50 ug/min via INTRAVENOUS

## 2018-07-21 SURGICAL SUPPLY — 49 items
BAG DECANTER FOR FLEXI CONT (MISCELLANEOUS) ×1 IMPLANT
BAG SPEC THK2 15X12 ZIP CLS (MISCELLANEOUS)
BAG ZIPLOCK 12X15 (MISCELLANEOUS) IMPLANT
BLADE SAG 18X100X1.27 (BLADE) ×2 IMPLANT
BLADE SURG SZ10 CARB STEEL (BLADE) ×4 IMPLANT
CHLORAPREP W/TINT 26ML (MISCELLANEOUS) ×1 IMPLANT
COVER PERINEAL POST (MISCELLANEOUS) ×2 IMPLANT
COVER SURGICAL LIGHT HANDLE (MISCELLANEOUS) ×2 IMPLANT
COVER WAND RF STERILE (DRAPES) IMPLANT
CUP ACETBLR 48 OD SECTOR II (Hips) ×1 IMPLANT
DECANTER SPIKE VIAL GLASS SM (MISCELLANEOUS) ×2 IMPLANT
DRAPE STERI IOBAN 125X83 (DRAPES) ×2 IMPLANT
DRAPE U-SHAPE 47X51 STRL (DRAPES) ×4 IMPLANT
DRSG ADAPTIC 3X8 NADH LF (GAUZE/BANDAGES/DRESSINGS) ×2 IMPLANT
DRSG MEPILEX BORDER 4X4 (GAUZE/BANDAGES/DRESSINGS) ×2 IMPLANT
DRSG MEPILEX BORDER 4X8 (GAUZE/BANDAGES/DRESSINGS) ×2 IMPLANT
ELECT REM PT RETURN 15FT ADLT (MISCELLANEOUS) ×2 IMPLANT
EVACUATOR 1/8 PVC DRAIN (DRAIN) ×2 IMPLANT
GLOVE BIO SURGEON STRL SZ7 (GLOVE) ×2 IMPLANT
GLOVE BIO SURGEON STRL SZ8 (GLOVE) ×2 IMPLANT
GLOVE BIOGEL PI IND STRL 6.5 (GLOVE) IMPLANT
GLOVE BIOGEL PI IND STRL 7.0 (GLOVE) ×1 IMPLANT
GLOVE BIOGEL PI IND STRL 7.5 (GLOVE) IMPLANT
GLOVE BIOGEL PI IND STRL 8 (GLOVE) ×1 IMPLANT
GLOVE BIOGEL PI INDICATOR 6.5 (GLOVE) ×1
GLOVE BIOGEL PI INDICATOR 7.0 (GLOVE) ×1
GLOVE BIOGEL PI INDICATOR 7.5 (GLOVE) ×3
GLOVE BIOGEL PI INDICATOR 8 (GLOVE) ×1
GLOVE SURG SS PI 6.5 STRL IVOR (GLOVE) ×1 IMPLANT
GLOVE SURG SS PI 7.5 STRL IVOR (GLOVE) ×1 IMPLANT
GOWN SPEC L4 XLG W/TWL (GOWN DISPOSABLE) ×1 IMPLANT
GOWN STRL REIN 2XL XLG LVL4 (GOWN DISPOSABLE) ×1 IMPLANT
GOWN STRL REUS W/TWL LRG LVL3 (GOWN DISPOSABLE) ×4 IMPLANT
HEAD FEM STD 28X+1.5 STRL (Hips) ×1 IMPLANT
HOLDER FOLEY CATH W/STRAP (MISCELLANEOUS) ×2 IMPLANT
LINER MARATHON 28 48 (Hips) ×1 IMPLANT
MANIFOLD NEPTUNE II (INSTRUMENTS) ×2 IMPLANT
PACK ANTERIOR HIP CUSTOM (KITS) ×2 IMPLANT
STEM FEM ACTIS HIGH SZ3 (Stem) ×1 IMPLANT
STRIP CLOSURE SKIN 1/2X4 (GAUZE/BANDAGES/DRESSINGS) ×3 IMPLANT
SUT ETHIBOND NAB CT1 #1 30IN (SUTURE) ×2 IMPLANT
SUT MNCRL AB 4-0 PS2 18 (SUTURE) ×2 IMPLANT
SUT STRATAFIX 0 PDS 27 VIOLET (SUTURE) ×2
SUT VIC AB 2-0 CT1 27 (SUTURE) ×4
SUT VIC AB 2-0 CT1 TAPERPNT 27 (SUTURE) ×2 IMPLANT
SUTURE STRATFX 0 PDS 27 VIOLET (SUTURE) ×1 IMPLANT
SYR 50ML LL SCALE MARK (SYRINGE) IMPLANT
TRAY FOLEY CATH 14FRSI W/METER (CATHETERS) ×1 IMPLANT
YANKAUER SUCT BULB TIP 10FT TU (MISCELLANEOUS) ×2 IMPLANT

## 2018-07-21 NOTE — Evaluation (Signed)
Physical Therapy Evaluation Patient Details Name: [name] [name] MRN: [id number] DOB: August 07, 1940 Today's Date: 07/21/2018   History of Present Illness  Pt s/p R THR and with hx of L TKR  Clinical Impression  Pt s/p R THR and presents with decreased R LE strength/ROM and post op pain limiting functional mobility.  Pt should progress to dc home with family assist.    Follow Up Recommendations Follow surgeon's recommendation for DC plan and follow-up therapies    Equipment Recommendations  None recommended by PT    Recommendations for Other Services       Precautions / Restrictions Precautions Precautions: Fall Restrictions Weight Bearing Restrictions: No Other Position/Activity Restrictions: WBAT      Mobility  Bed Mobility Overal bed mobility: Needs Assistance Bed Mobility: Supine to Sit     Supine to sit: Min assist     General bed mobility comments: cues for sequence and use of L LE to self assist  Transfers Overall transfer level: Needs assistance Equipment used: Rolling walker (2 wheeled) Transfers: Sit to/from Stand Sit to Stand: Min assist         General transfer comment: cues for LE management and use of UEs to self assist  Ambulation/Gait Ambulation/Gait assistance: Min assist Gait Distance (Feet): 34 Feet Assistive device: Rolling walker (2 wheeled) Gait Pattern/deviations: Step-to pattern;Decreased step length - right;Decreased step length - left;Shuffle;Trunk flexed Gait velocity: decr   General Gait Details: cues for sequence, posture and position from [employer]            Wheelchair Mobility    Modified Rankin (Stroke Patients Only)       Balance Overall balance assessment: Mild deficits observed, not formally tested                                           Pertinent Vitals/Pain Pain Assessment: 0-10 Pain Score: 6  Pain Location: R hip Pain Descriptors / Indicators: Aching;Sore Pain Intervention(s):  Limited activity within patient's tolerance;Premedicated before session;Monitored during session;Ice applied    Home Living Family/patient expects to be discharged to:: Private residence Living Arrangements: Alone Available Help at Discharge: Family;Friend(s);Available 24 hours/day Type of Home: House Home Access: Stairs to enter Entrance Stairs-Rails: None Entrance Stairs-Number of Steps: 2 Home Layout: Two level;Able to live on main level with bedroom/bathroom Home Equipment: Gilford Rile - 2 wheels      Prior Function Level of Independence: Independent               Hand Dominance        Extremity/Trunk Assessment   Upper Extremity Assessment Upper Extremity Assessment: Overall WFL for tasks assessed    Lower Extremity Assessment Lower Extremity Assessment: RLE deficits/detail       Communication   Communication: No difficulties  Cognition Arousal/Alertness: Awake/alert Behavior During Therapy: WFL for tasks assessed/performed Overall Cognitive Status: Within Functional Limits for tasks assessed                                        General Comments      Exercises Total Joint Exercises Ankle Circles/Pumps: AROM;Both;15 reps;Supine   Assessment/Plan    PT Assessment Patient needs continued PT services  PT Problem List Decreased strength;Decreased range of motion;Decreased activity tolerance;Decreased mobility;Pain;Decreased knowledge of use of DME  PT Treatment Interventions DME instruction;Gait training;Stair training;Functional mobility training;Therapeutic activities;Therapeutic exercise;Patient/family education    PT Goals (Current goals can be found in the Care Plan section)  Acute Rehab PT Goals Patient Stated Goal: regain IND PT Goal Formulation: With patient Time For Goal Achievement: 07/28/18 Potential to Achieve Goals: Good    Frequency 7X/week   Barriers to discharge        Co-evaluation                AM-PAC PT "6 Clicks" Mobility  Outcome Measure Help needed turning from your back to your side while in a flat bed without using bedrails?: A Little Help needed moving from lying on your back to sitting on the side of a flat bed without using bedrails?: A Little Help needed moving to and from a bed to a chair (including a wheelchair)?: A Little Help needed standing up from a chair using your arms (e.g., wheelchair or bedside chair)?: A Little Help needed to walk in hospital room?: A Little Help needed climbing 3-5 steps with a railing? : A Lot 6 Click Score: 17    End of Session Equipment Utilized During Treatment: Gait belt Activity Tolerance: Patient tolerated treatment well Patient left: in chair;with call bell/phone within reach;with family/visitor present Nurse Communication: Mobility status PT Visit Diagnosis: Difficulty in walking, not elsewhere classified (R26.2)    Time: 3354-5625 PT Time Calculation (min) (ACUTE ONLY): 31 min   Charges:   PT Evaluation $PT Eval Low Complexity: 1 Low PT Treatments $Gait Training: 8-22 mins        [place] Pager [phone] Office [phone]   Tifani Dack 07/21/2018, 5:09 PM

## 2018-07-21 NOTE — Anesthesia Postprocedure Evaluation (Signed)
Anesthesia Post Note  Patient: [name] [name]  Procedure(s) Performed: TOTAL HIP ARTHROPLASTY ANTERIOR APPROACH (Right )     Patient location during evaluation: PACU Anesthesia Type: Spinal Level of consciousness: oriented and awake and alert Pain management: pain level controlled Vital Signs Assessment: post-procedure vital signs reviewed and stable Respiratory status: spontaneous breathing, respiratory function stable and patient connected to nasal cannula oxygen Cardiovascular status: blood pressure returned to baseline and stable Postop Assessment: no headache, no backache, no apparent nausea or vomiting and spinal receding Anesthetic complications: no    Last Vitals:  Vitals:   07/21/18 1302 07/21/18 1404  BP: (!) 153/92 (!) 142/85  Pulse: 70 82  Resp: 16 16  Temp: 36.4 C   SpO2: 100% 99%    Last Pain:  Vitals:   07/21/18 1600  TempSrc:   PainSc: 7                  Zakaree Mcclenahan P [name]

## 2018-07-21 NOTE — Progress Notes (Signed)
Patient declines the use of nocturnal CPAP tonight. She states she wants nothing to interrupt her sleep tonight and the CPAP pressure often awakens her when it ramps up at home. She is aware that she can call at any time if she should change her mind. She asks that someone check back with her tomorrow night if she does not get discharged to initiate CPAP. RT will continue to follow.

## 2018-07-21 NOTE — Interval H&P Note (Signed)
History and Physical Interval Note:  07/21/2018 6:29 AM  [name] [name]  has presented today for surgery, with the diagnosis of right hip osteoarthritis  The various methods of treatment have been discussed with the patient and family. After consideration of risks, benefits and other options for treatment, the patient has consented to  Procedure(s): TOTAL HIP ARTHROPLASTY ANTERIOR APPROACH (Right) as a surgical intervention .  The patient's history has been reviewed, patient examined, no change in status, stable for surgery.  I have reviewed the patient's chart and labs.  Questions were answered to the patient's satisfaction.     Pilar Plate Dayonna Selbe

## 2018-07-21 NOTE — Discharge Instructions (Signed)
°Dr. Frank Aluisio °Total Joint Specialist °[facility] °[address] °[place], [place] [address] °[phone] ° °ANTERIOR APPROACH TOTAL HIP REPLACEMENT POSTOPERATIVE DIRECTIONS ° ° °Hip Rehabilitation, Guidelines Following Surgery  °The results of a hip operation are greatly improved after range of motion and muscle strengthening exercises. Follow all safety measures which are given to protect your hip. If any of these exercises cause increased pain or swelling in your joint, decrease the amount until you are comfortable again. Then slowly increase the exercises. Call your caregiver if you have problems or questions.  ° °HOME CARE INSTRUCTIONS  °• Remove items at home which could result in a fall. This includes throw rugs or furniture in walking pathways.  °· ICE to the affected hip every three hours for 30 minutes at a time and then as needed for pain and swelling.  Continue to use ice on the hip for pain and swelling from surgery. You may notice swelling that will progress down to the foot and ankle.  This is normal after surgery.  Elevate the leg when you are not up walking on it.   °· Continue to use the breathing machine which will help keep your temperature down.  It is common for your temperature to cycle up and down following surgery, especially at night when you are not up moving around and exerting yourself.  The breathing machine keeps your lungs expanded and your temperature down. ° °DIET °You may resume your previous home diet once your are discharged from the hospital. ° °DRESSING / WOUND CARE / SHOWERING °You may shower 3 days after surgery, but keep the wounds dry during showering.  You may use an occlusive plastic wrap (Press'n Seal for example), NO SOAKING/SUBMERGING IN THE BATHTUB.  If the bandage gets wet, change with a clean dry gauze.  If the incision gets wet, pat the wound dry with a clean towel. °You may start showering once you are discharged home but do not submerge the  incision under water. Just pat the incision dry and apply a dry gauze dressing on daily. °Change the surgical dressing daily and reapply a dry dressing each time. ° °ACTIVITY °Walk with your walker as instructed. °Use walker as long as suggested by your caregivers. °Avoid periods of inactivity such as sitting longer than an hour when not asleep. This helps prevent blood clots.  °You may resume a sexual relationship in one month or when given the OK by your doctor.  °You may return to work once you are cleared by your doctor.  °Do not drive a car for 6 weeks or until released by you surgeon.  °Do not drive while taking narcotics. ° °WEIGHT BEARING °Weight bearing as tolerated with assist device (walker, cane, etc) as directed, use it as long as suggested by your surgeon or therapist, typically at least 4-6 weeks. ° °POSTOPERATIVE CONSTIPATION PROTOCOL °Constipation - defined medically as fewer than three stools per week and severe constipation as less than one stool per week. ° °One of the most common issues patients have following surgery is constipation.  Even if you have a regular bowel pattern at home, your normal regimen is likely to be disrupted due to multiple reasons following surgery.  Combination of anesthesia, postoperative narcotics, change in appetite and fluid intake all can affect your bowels.  In order to avoid complications following surgery, here are some recommendations in order to help you during your recovery period. ° °Colace (docusate) - Pick up an over-the-counter form   of Colace or another stool softener and take twice a day as long as you are requiring postoperative pain medications.  Take with a full glass of water daily.  If you experience loose stools or diarrhea, hold the colace until you stool forms back up.  If your symptoms do not get better within 1 week or if they get worse, check with your doctor. ° °Dulcolax (bisacodyl) - Pick up over-the-counter and take as directed by the product  packaging as needed to assist with the movement of your bowels.  Take with a full glass of water.  Use this product as needed if not relieved by Colace only.  ° °MiraLax (polyethylene glycol) - Pick up over-the-counter to have on hand.  MiraLax is a solution that will increase the amount of water in your bowels to assist with bowel movements.  Take as directed and can mix with a glass of water, juice, soda, coffee, or tea.  Take if you go more than two days without a movement. °Do not use MiraLax more than once per day. Call your doctor if you are still constipated or irregular after using this medication for 7 days in a row. ° °If you continue to have problems with postoperative constipation, please contact the office for further assistance and recommendations.  If you experience "the worst abdominal pain ever" or develop nausea or vomiting, please contact the office immediatly for further recommendations for treatment. ° °ITCHING ° If you experience itching with your medications, try taking only a single pain pill, or even half a pain pill at a time.  You can also use Benadryl over the counter for itching or also to help with sleep.  ° °TED HOSE STOCKINGS °Wear the elastic stockings on both legs for three weeks following surgery during the day but you may remove then at night for sleeping. ° °MEDICATIONS °See your medication summary on the “After Visit Summary” that the nursing staff will review with you prior to discharge.  You may have some home medications which will be placed on hold until you complete the course of blood thinner medication.  It is important for you to complete the blood thinner medication as prescribed by your surgeon.  Continue your approved medications as instructed at time of discharge. ° °PRECAUTIONS °If you experience chest pain or shortness of breath - call 911 immediately for transfer to the hospital emergency department.  °If you develop a fever greater that 101 F, purulent drainage  from wound, increased redness or drainage from wound, foul odor from the wound/dressing, or calf pain - CONTACT YOUR SURGEON.   °                                                °FOLLOW-UP APPOINTMENTS °Make sure you keep all of your appointments after your operation with your surgeon and caregivers. You should call the office at the above phone number and make an appointment for approximately two weeks after the date of your surgery or on the date instructed by your surgeon outlined in the "After Visit Summary". ° °RANGE OF MOTION AND STRENGTHENING EXERCISES  °These exercises are designed to help you keep full movement of your hip joint. Follow your caregiver's or physical therapist's instructions. Perform all exercises about fifteen times, three times per day or as directed. Exercise both hips, even if you have   had only one joint replacement. These exercises can be done on a training (exercise) mat, on the floor, on a table or on a bed. Use whatever works the best and is most comfortable for you. Use music or television while you are exercising so that the exercises are a pleasant break in your day. This will make your life better with the exercises acting as a break in routine you can look forward to.  °• Lying on your back, slowly slide your foot toward your buttocks, raising your knee up off the floor. Then slowly slide your foot back down until your leg is straight again.  °• Lying on your back spread your legs as far apart as you can without causing discomfort.  °• Lying on your side, raise your upper leg and foot straight up from the floor as far as is comfortable. Slowly lower the leg and repeat.  °• Lying on your back, tighten up the muscle in the front of your thigh (quadriceps muscles). You can do this by keeping your leg straight and trying to raise your heel off the floor. This helps strengthen the largest muscle supporting your knee.  °• Lying on your back, tighten up the muscles of your buttocks both  with the legs straight and with the knee bent at a comfortable angle while keeping your heel on the floor.  ° °IF YOU ARE TRANSFERRED TO A SKILLED REHAB FACILITY °If the patient is transferred to a skilled rehab facility following release from the hospital, a list of the current medications will be sent to the facility for the patient to continue.  When discharged from the skilled rehab facility, please have the facility set up the patient's Home Health Physical Therapy prior to being released. Also, the skilled facility will be responsible for providing the patient with their medications at time of release from the facility to include their pain medication, the muscle relaxants, and their blood thinner medication. If the patient is still at the rehab facility at time of the two week follow up appointment, the skilled rehab facility will also need to assist the patient in arranging follow up appointment in our office and any transportation needs. ° °MAKE SURE YOU:  °• Understand these instructions.  °• Get help right away if you are not doing well or get worse.  ° ° °Pick up stool softner and laxative for home use following surgery while on pain medications. °Do not submerge incision under water. °Please use good hand washing techniques while changing dressing each day. °May shower starting three days after surgery. °Please use a clean towel to pat the incision dry following showers. °Continue to use ice for pain and swelling after surgery. °Do not use any lotions or creams on the incision until instructed by your surgeon. ° °

## 2018-07-21 NOTE — Anesthesia Procedure Notes (Signed)
Spinal  Patient location during procedure: OR Start time: 07/21/2018 7:10 AM End time: 07/21/2018 7:15 AM Staffing Anesthesiologist: Murvin Natal, MD Performed: anesthesiologist  Preanesthetic Checklist Completed: patient identified, surgical consent, pre-op evaluation, timeout performed, IV checked, risks and benefits discussed and monitors and equipment checked Spinal Block Patient position: sitting Prep: DuraPrep Patient monitoring: cardiac monitor, continuous pulse ox and blood pressure Approach: left paramedian Location: L4-5 Injection technique: single-shot Needle Needle type: Pencan  Needle gauge: 24 G Needle length: 9 cm Assessment Sensory level: T10 Additional Notes Functioning IV was confirmed and monitors were applied. Sterile prep and drape, including hand hygiene and sterile gloves were used. The patient was positioned and the spine was prepped. The skin was anesthetized with lidocaine.  Free flow of clear CSF was obtained prior to injecting local anesthetic into the CSF.  The spinal needle aspirated freely following injection.  The needle was carefully withdrawn.  The patient tolerated the procedure well.

## 2018-07-21 NOTE — Op Note (Signed)
OPERATIVE REPORT- TOTAL HIP ARTHROPLASTY   PREOPERATIVE DIAGNOSIS: Osteoarthritis of the Right hip.   POSTOPERATIVE DIAGNOSIS: Osteoarthritis of the Right  hip.   PROCEDURE: Right total hip arthroplasty, anterior approach.   SURGEON: Gaynelle Arabian, MD   ASSISTANT: Eloise Harman, PA-C  ANESTHESIA:  Spinal  ESTIMATED BLOOD LOSS:-450 mL    DRAINS: Hemovac x1.   COMPLICATIONS: None   CONDITION: PACU - hemodynamically stable.   BRIEF CLINICAL NOTE: [name] [name] is a 78 y.o. female who has advanced end-  stage arthritis of their Right  hip with progressively worsening pain and  dysfunction.The patient has failed nonoperative management and presents for  total hip arthroplasty.   PROCEDURE IN DETAIL: After successful administration of spinal  anesthetic, the traction boots for the [facility] bed were placed on both  feet and the patient was placed onto the [facility] bed, boots placed into the leg  holders. The Right hip was then isolated from the perineum with plastic  drapes and prepped and draped in the usual sterile fashion. ASIS and  greater trochanter were marked and a oblique incision was made, starting  at about 1 cm lateral and 2 cm distal to the ASIS and coursing towards  the anterior cortex of the femur. The skin was cut with a 10 blade  through subcutaneous tissue to the level of the fascia overlying the  tensor fascia lata muscle. The fascia was then incised in line with the  incision at the junction of the anterior third and posterior 2/3rd. The  muscle was teased off the fascia and then the interval between the TFL  and the rectus was developed. The Hohmann retractor was then placed at  the top of the femoral neck over the capsule. The vessels overlying the  capsule were cauterized and the fat on top of the capsule was removed.  A Hohmann retractor was then placed anterior underneath the rectus  femoris to give exposure to the entire anterior capsule. A T-shaped   capsulotomy was performed. The edges were tagged and the femoral head  was identified.       Osteophytes are removed off the superior acetabulum.  The femoral neck was then cut in situ with an oscillating saw. Traction  was then applied to the left lower extremity utilizing the [facility]  traction. The femoral head was then removed. Retractors were placed  around the acetabulum and then circumferential removal of the labrum was  performed. Osteophytes were also removed. Reaming starts at 45 mm to  medialize and  Increased in 2 mm increments to 47 mm. We reamed in  approximately 40 degrees of abduction, 20 degrees anteversion. A 48 mm  pinnacle acetabular shell was then impacted in anatomic position under  fluoroscopic guidance with excellent purchase. We did not need to place  any additional dome screws. A 28 mm neutral + 4 marathon liner was then  placed into the acetabular shell.       The femoral lift was then placed along the lateral aspect of the femur  just distal to the vastus ridge. The leg was  externally rotated and capsule  was stripped off the inferior aspect of the femoral neck down to the  level of the lesser trochanter, this was done with electrocautery. The femur was lifted after this was performed. The  leg was then placed in an extended and adducted position essentially delivering the femur. We also removed the capsule superiorly and the piriformis from the piriformis  fossa to gain excellent exposure of the  proximal femur. Rongeur was used to remove some cancellous bone to get  into the lateral portion of the proximal femur for placement of the  initial starter reamer. The starter broaches was placed  the starter broach  and was shown to go down the center of the canal. Broaching  with the Actis system was then performed starting at size 0  coursing  Up to size 3. A size 3 had excellent torsional and rotational  and axial stability. The trial high offset neck was then placed   with a 28 + 1.5 trial head. The hip was then reduced. We confirmed that  the stem was in the canal both on AP and lateral x-rays. It also has excellent sizing. The hip was reduced with outstanding stability through full extension and full external rotation.. AP pelvis was taken and the leg lengths were measured and found to be equal. Hip was then dislocated again and the femoral head and neck removed. The  femoral broach was removed. Size 3 Actis stem with a high offset  neck was then impacted into the femur following native anteversion. Has  excellent purchase in the canal. Excellent torsional and rotational and  axial stability. It is confirmed to be in the canal on AP and lateral  fluoroscopic views. The 28 + 1.5 metal head was placed and the hip  reduced with outstanding stability. Again AP pelvis was taken and it  confirmed that the leg lengths were equal. The wound was then copiously  irrigated with saline solution and the capsule reattached and repaired  with Ethibond suture. 30 ml of .25% Bupivicaine was  injected into the capsule and into the edge of the tensor fascia lata as well as subcutaneous tissue. The fascia overlying the tensor fascia lata was then closed with a running #1 V-Loc. Subcu was closed with interrupted 2-0 Vicryl and subcuticular running 4-0 Monocryl. Incision was cleaned  and dried. Steri-Strips and a bulky sterile dressing applied. Hemovac  drain was hooked to suction and then the patient was awakened and transported to  recovery in stable condition.        Please note that a surgical assistant was a medical necessity for this procedure to perform it in a safe and expeditious manner. Assistant was necessary to provide appropriate retraction of vital neurovascular structures and to prevent femoral fracture and allow for anatomic placement of the prosthesis.  Gaynelle Arabian, M.D.

## 2018-07-21 NOTE — Transfer of Care (Signed)
Immediate Anesthesia Transfer of Care Note  Patient: [name] [name]  Procedure(s) Performed: TOTAL HIP ARTHROPLASTY ANTERIOR APPROACH (Right )  Patient Location: PACU  Anesthesia Type:Spinal  Level of Consciousness: awake, alert , oriented and patient cooperative  Airway & Oxygen Therapy: Patient Spontanous Breathing and Patient connected to face mask oxygen  Post-op Assessment: Report given to RN, Post -op Vital signs reviewed and stable and Patient moving all extremities  Post vital signs: Reviewed and stable  Last Vitals:  Vitals Value Taken Time  BP    Temp    Pulse    Resp    SpO2      Last Pain:  Vitals:   07/21/18 0611  TempSrc:   PainSc: 0-No pain         Complications: No apparent anesthesia complications

## 2018-07-22 ENCOUNTER — Encounter (HOSPITAL_COMMUNITY): Payer: Self-pay | Admitting: Orthopedic Surgery

## 2018-07-22 LAB — BASIC METABOLIC PANEL
Anion gap: 5 (ref 5–15)
BUN: 13 mg/dL (ref 8–23)
CO2: 26 mmol/L (ref 22–32)
Calcium: 9 mg/dL (ref 8.9–10.3)
Chloride: 106 mmol/L (ref 98–111)
Creatinine, Ser: 0.73 mg/dL (ref 0.44–1.00)
GFR calc Af Amer: >60 mL/min (ref >60)
GFR calc non Af Amer: >60 mL/min (ref >60)
Glucose, Bld: 134 mg/dL — ABNORMAL HIGH (ref 70–99)
Potassium: 4.2 mmol/L (ref 3.5–5.1)
Sodium: 137 mmol/L (ref 135–145)

## 2018-07-22 LAB — CBC
HCT: 36.3 % (ref 36.0–46.0)
Hemoglobin: 11.8 g/dL — ABNORMAL LOW (ref 12.0–15.0)
MCH: 31.2 pg (ref 26.0–34.0)
MCHC: 32.5 g/dL (ref 30.0–36.0)
MCV: 96 fL (ref 80.0–100.0)
Platelets: 175 10*3/uL (ref 150–400)
RBC: 3.78 MIL/uL — ABNORMAL LOW (ref 3.87–5.11)
RDW: 12.5 % (ref 11.5–15.5)
WBC: 13.6 10*3/uL — ABNORMAL HIGH (ref 4.0–10.5)
nRBC: 0 % (ref 0.0–0.2)

## 2018-07-22 MED ORDER — METHOCARBAMOL 500 MG PO TABS: 500.0000 mg | ORAL_TABLET | Freq: Four times a day (QID) | ORAL | 0 refills | Status: DC | PRN

## 2018-07-22 MED ORDER — ASPIRIN 325 MG PO TBEC: 325.0000 mg | DELAYED_RELEASE_TABLET | Freq: Two times a day (BID) | ORAL | 0 refills | Status: AC

## 2018-07-22 MED ORDER — TRAMADOL HCL 50 MG PO TABS: 50.0000 mg | ORAL_TABLET | Freq: Four times a day (QID) | ORAL | 0 refills | Status: DC | PRN

## 2018-07-22 MED ORDER — OXYCODONE HCL 5 MG PO TABS: 5.0000 mg | ORAL_TABLET | Freq: Four times a day (QID) | ORAL | 0 refills | Status: DC | PRN

## 2018-07-22 NOTE — Progress Notes (Signed)
Patient continues to decline nocturnal CPAP. Order changed to prn per RT protocol.  

## 2018-07-22 NOTE — Plan of Care (Signed)
Plan of care reviewed and discussed with the patient. 

## 2018-07-22 NOTE — Progress Notes (Signed)
Physical Therapy Treatment Patient Details Name: [name] [name] MRN: [id number] DOB: 11-Jun-1940 Today's Date: 07/22/2018    History of Present Illness Pt s/p R THR and with hx of L TKR    PT Comments    Pt very cooperative but continues to struggle with mobility 2* pain and premorbid deconditioning.   Follow Up Recommendations  Follow surgeon's recommendation for DC plan and follow-up therapies     Equipment Recommendations  None recommended by PT    Recommendations for Other Services       Precautions / Restrictions Precautions Precautions: Fall Restrictions Weight Bearing Restrictions: No Other Position/Activity Restrictions: WBAT    Mobility  Bed Mobility Overal bed mobility: Needs Assistance Bed Mobility: Supine to Sit     Supine to sit: Min assist     General bed mobility comments: increased time with cues for sequence and use of L LE to self assist  Transfers Overall transfer level: Needs assistance Equipment used: Rolling walker (2 wheeled) Transfers: Sit to/from Stand Sit to Stand: Min assist         General transfer comment: cues for LE management and use of UEs to self assist  Ambulation/Gait Ambulation/Gait assistance: Min assist Gait Distance (Feet): 50 Feet Assistive device: Rolling walker (2 wheeled) Gait Pattern/deviations: Step-to pattern;Decreased step length - right;Decreased step length - left;Shuffle;Trunk flexed Gait velocity: decr   General Gait Details: cues for sequence, posture and position from [employer]             Wheelchair Mobility    Modified Rankin (Stroke Patients Only)       Balance Overall balance assessment: Mild deficits observed, not formally tested                                          Cognition Arousal/Alertness: Awake/alert Behavior During Therapy: WFL for tasks assessed/performed Overall Cognitive Status: Within Functional Limits for tasks assessed                                        Exercises Total Joint Exercises Ankle Circles/Pumps: AROM;Both;15 reps;Supine Quad Sets: AROM;Both;10 reps;Supine Heel Slides: AAROM;Right;20 reps;Supine Hip ABduction/ADduction: AAROM;Right;15 reps;Supine Long Arc Quad: AAROM;AROM;Right;10 reps;Seated    General Comments        Pertinent Vitals/Pain Pain Assessment: 0-10 Pain Score: 6  Pain Location: R hip Pain Descriptors / Indicators: Aching;Sore Pain Intervention(s): Limited activity within patient's tolerance;Monitored during session;Premedicated before session;Ice applied    Home Living                      Prior Function            PT Goals (current goals can now be found in the care plan section) Acute Rehab PT Goals Patient Stated Goal: regain IND PT Goal Formulation: With patient Time For Goal Achievement: 07/28/18 Potential to Achieve Goals: Good Progress towards PT goals: Progressing toward goals    Frequency    7X/week      PT Plan Current plan remains appropriate    Co-evaluation              AM-PAC PT "6 Clicks" Mobility   Outcome Measure  Help needed turning from your back to your side while in a flat bed without using bedrails?:  A Little Help needed moving from lying on your back to sitting on the side of a flat bed without using bedrails?: A Little Help needed moving to and from a bed to a chair (including a wheelchair)?: A Little Help needed standing up from a chair using your arms (e.g., wheelchair or bedside chair)?: A Little Help needed to walk in hospital room?: A Little Help needed climbing 3-5 steps with a railing? : A Lot 6 Click Score: 17    End of Session Equipment Utilized During Treatment: Gait belt Activity Tolerance: Patient tolerated treatment well Patient left: in chair;with call bell/phone within reach;with family/visitor present Nurse Communication: Mobility status PT Visit Diagnosis: Difficulty in walking, not elsewhere  classified (R26.2)     Time: 5686-1683 PT Time Calculation (min) (ACUTE ONLY): 33 min  Charges:  $Gait Training: 8-22 mins $Therapeutic Exercise: 8-22 mins                     [place] Pager [phone] Office [phone]    Cynitha Berte 07/22/2018, 1:45 PM

## 2018-07-22 NOTE — Progress Notes (Signed)
   Subjective: 1 Day Post-Op Procedure(s) (LRB): TOTAL HIP ARTHROPLASTY ANTERIOR APPROACH (Right) Patient reports pain as mild.   Patient seen in rounds with Dr. Wynelle Link. Patient is well, and has had no acute complaints or problems other than pain in the right hip. Denies chest pain or SOB. Foley catheter removed this AM. No issues overnight.  We will continue therapy today.   Objective: Vital signs in last 24 hours: Temp:  [97.5 F (36.4 C)-99.1 F (37.3 C)] 97.9 F (36.6 C) (02/27 0523) Pulse Rate:  [66-82] 80 (02/27 0523) Resp:  [9-18] 14 (02/27 0523) BP: (109-160)/(65-92) 156/78 (02/27 0523) SpO2:  [94 %-100 %] 95 % (02/27 0523)  Intake/Output from previous day:  Intake/Output Summary (Last 24 hours) at 07/22/2018 0808 Last data filed at 07/22/2018 0523 Gross per 24 hour  Intake 3467.24 ml  Output 2580 ml  Net 887.24 ml    Labs: Recent Labs    07/19/18 0948 07/22/18 0453  HGB 15.4* 11.8*   Recent Labs    07/19/18 0948 07/22/18 0453  WBC 7.0 13.6*  RBC 4.91 3.78*  HCT 46.4* 36.3  PLT 216 175   Recent Labs    07/19/18 0948 07/22/18 0453  NA 141 137  K 4.5 4.2  CL 107 106  CO2 27 26  BUN 19 13  CREATININE 0.88 0.73  GLUCOSE 102* 134*  CALCIUM 9.8 9.0   Recent Labs    07/19/18 0948  INR 1.0   Exam: General - Patient is Alert and Oriented Extremity - Neurologically intact Neurovascular intact Sensation intact distally Dorsiflexion/Plantar flexion intact Dressing - dressing C/D/I Motor Function - intact, moving foot and toes well on exam.   Past Medical History:  Diagnosis Date  . Arthritis    knees  . Depression   . Fatty liver   . GERD (gastroesophageal reflux disease)   . Hyperlipidemia   . Hypertension   . IBS (irritable bowel syndrome)   . Obesity   . Pneumonia   . Sleep apnea    uses CPAP    Assessment/Plan: 1 Day Post-Op Procedure(s) (LRB): TOTAL HIP ARTHROPLASTY ANTERIOR APPROACH (Right) Principal Problem:   OA  (osteoarthritis) of hip  Estimated body mass index is 35.14 kg/m as calculated from the following:   Height as of this encounter: 4\' 11"  (1.499 m).   Weight as of this encounter: 78.9 kg. Advance diet Up with therapy D/C IV fluids  DVT Prophylaxis - Aspirin Weight bearing as tolerated. D/C O2 and pulse ox and try on room air. Hemovac pulled without difficulty, will continue therapy.  Plan is to go Home after hospital stay. Possible discharge this afternoon with HHPT if progresses with therapy today and meeting her goals. Follow-up in the office in 2 weeks.   Theresa Duty, PA-C Orthopedic Surgery 07/22/2018, 8:08 AM

## 2018-07-22 NOTE — Progress Notes (Signed)
Pt has had slower progression with physical therapy, is not yet meeting goals to be cleared for discharge. Will stay overnight and continue working with therapy tomorrow.  

## 2018-07-22 NOTE — Evaluation (Signed)
Occupational Therapy Evaluation Patient Details Name: [name] [name] MRN: [id number] DOB: 10-05-1940 Today's Date: 07/22/2018    History of Present Illness Pt s/p R THR and with hx of L TKR   Clinical Impression   Pt admitted with above diagnoses, with pain in R hip and decreased activity tolerance and knowledge of DME/AE limiting ability to complete BADL at desired level of ind. Daughter present for session and education. Focused session on LB ADL training and BADL transfers. Introduced AE, pt familiar with sock aide. Pt donned underwear using reacher at min A, attempted without reacher needing mod A with increased pain. Educated on using DME for pain mgt. Pt donned sock with sock aide at set up A with VC's. Pt appropriate for long handled reacher, long handled sponge, long handled shoe horn, and sock aide this date. BSC over toilet t/f completed this date at min guard A. BSC education given for additional use as shower chair, pt and dtr in understanding. Pt continuing to need min A for bed mobility this date, educated on leg lifter as an option. At this time recommend pt receive HHOT at d/c to continue to maximize safety and ind in BADL activity in the home. Will continue to follow acutely per POC.    Follow Up Recommendations  Home health OT    Equipment Recommendations  Other (comment)(LB dressing equipment)    Recommendations for Other Services       Precautions / Restrictions Precautions Precautions: Fall Restrictions Weight Bearing Restrictions: No Other Position/Activity Restrictions: WBAT      Mobility Bed Mobility Overal bed mobility: Needs Assistance Bed Mobility: Sit to Supine     Supine to sit: Min assist Sit to supine: Min assist   General bed mobility comments: increased time, cues for sequencing and guiding of LLE- pt with anxious and quick movements without first cueing  Transfers Overall transfer level: Needs assistance Equipment used: Rolling walker (2  wheeled) Transfers: Sit to/from Stand Sit to Stand: Min assist         General transfer comment: pt recalling cues from PT session, appropriate hand placement with sit > stand with RW    Balance Overall balance assessment: Mild deficits observed, not formally tested                                         ADL either performed or assessed with clinical judgement   ADL Overall ADL's : Needs assistance/impaired Eating/Feeding: Independent;Sitting   Grooming: Min guard   Upper Body Bathing: Modified independent;Sitting   Lower Body Bathing: Moderate assistance;Sit to/from stand;Sitting/lateral leans   Upper Body Dressing : Set up;Sitting   Lower Body Dressing: Minimal assistance;With adaptive equipment;Sit to/from stand;Sitting/lateral leans Lower Body Dressing Details (indicate cue type and reason): using sock aide for socks and long handled reacher for underwear Toilet Transfer: Min guard;BSC;RW;Cueing for safety Toilet Transfer Details (indicate cue type and reason): cues for hand placement, wanting to use grab bar instead of BSC over toilet Toileting- Clothing Manipulation and Hygiene: Modified independent   Tub/ Shower Transfer: Minimal assistance;Shower seat;Rolling walker;3 in 1   Functional mobility during ADLs: Min guard;Rolling walker General ADL Comments: focused session on LB ADL training, introduced AE. BSC over toilet t/f completed     Vision Baseline Vision/History: No visual deficits       Perception     Praxis      Pertinent  Vitals/Pain Pain Assessment: 0-10 Pain Score: 6  Pain Location: R hip Pain Descriptors / Indicators: Aching;Sore Pain Intervention(s): Limited activity within patient's tolerance;Monitored during session;Repositioned     Hand Dominance     Extremity/Trunk Assessment Upper Extremity Assessment Upper Extremity Assessment: Overall WFL for tasks assessed   Lower Extremity Assessment Lower Extremity  Assessment: Defer to PT evaluation       Communication Communication Communication: No difficulties   Cognition Arousal/Alertness: Awake/alert Behavior During Therapy: Anxious Overall Cognitive Status: Within Functional Limits for tasks assessed                                 General Comments: very anxious with many questions/concerns, self doubting   General Comments       Exercises     Shoulder Instructions      Home Living Family/patient expects to be discharged to:: Private residence Living Arrangements: Alone Available Help at Discharge: Family;Friend(s);Available 24 hours/day Type of Home: House Home Access: Stairs to enter [employer] of Steps: 2 Entrance Stairs-Rails: None Home Layout: Two level;Able to live on main level with bedroom/bathroom Alternate Level Stairs-Number of Steps: 1 flight Alternate Level Stairs-Rails: Left           Home Equipment: Walker - standard;Walker - 4 wheels;Shower seat   Additional Comments: pt recieved BSC at time of eval, requesting for RW from PT (OT informed PT of this)      Prior Functioning/Environment Level of Independence: Independent                 OT Problem List: Decreased knowledge of use of DME or AE;Decreased range of motion;Decreased coordination;Decreased activity tolerance;Impaired balance (sitting and/or standing);Pain      OT Treatment/Interventions: Self-care/ADL training;Balance training;Therapeutic exercise;Therapeutic activities;DME and/or AE instruction;Patient/family education    OT Goals(Current goals can be found in the care plan section) Acute Rehab OT Goals Patient Stated Goal: to get back to moving around without pain OT Goal Formulation: With patient Time For Goal Achievement: 08/05/18 Potential to Achieve Goals: Good  OT Frequency: Min 2X/week   Barriers to D/C:            Co-evaluation              AM-PAC OT "6 Clicks" Daily Activity      Outcome Measure Help from another person eating meals?: None Help from another person taking care of personal grooming?: A Little Help from another person toileting, which includes using toliet, bedpan, or urinal?: A Little Help from another person bathing (including washing, rinsing, drying)?: A Lot Help from another person to put on and taking off regular upper body clothing?: None Help from another person to put on and taking off regular lower body clothing?: A Little 6 Click Score: 19   End of Session Equipment Utilized During Treatment: Gait belt;Rolling walker  Activity Tolerance: Patient tolerated treatment well Patient left: in bed;with call bell/phone within reach;with bed alarm set;with family/visitor present  OT Visit Diagnosis: Other abnormalities of gait and mobility (R26.89);Pain Pain - Right/Left: Right Pain - part of body: Hip                Time: 3419-3790 OT Time Calculation (min): 32 min Charges:  OT General Charges $OT Visit: 1 Visit OT Evaluation $OT Eval Low Complexity: 1 Low OT Treatments $Self Care/Home Management : 8-22 mins  Zenovia Jarred, MSOT, OTR/L Behavioral Health OT/ Acute Relief OT [facility]  Office: [place] 07/22/2018, 5:23 PM

## 2018-07-22 NOTE — Care Management Note (Signed)
Case Management Note  Patient Details  Name: [name] [name] MRN: [id number] Date of Birth: Feb 15, 1941  Subjective/Objective:                  Discharge planning  Action/Plan: [facility]=pt-[facility] dme-has at home Expected Discharge Date:  07/22/18               Expected Discharge Plan:  [place]  In-House Referral:     Discharge planning Services  CM Consult  Post Acute Care Choice:  Home Health Choice offered to:  Patient  DME Arranged:    DME Agency:     HH Arranged:  PT [place]:  [facility] (now [facility])  Status of Service:  Completed, signed off  If discussed at [employer] of Stay Meetings, dates discussed:    Additional Comments:  Leeroy Cha, RN 07/22/2018, 1:56 PM

## 2018-07-22 NOTE — Plan of Care (Signed)
  Problem: Activity: Goal: Ability to tolerate increased activity will improve Outcome: Progressing   

## 2018-07-22 NOTE — Progress Notes (Signed)
Physical Therapy Treatment Patient Details Name: [name] [name] MRN: [id number] DOB: January 04, 1941 Today's Date: 07/22/2018    History of Present Illness Pt s/p R THR and with hx of L TKR    PT Comments    Pt continues cooperative but with increased anxiety requiring increased time and frequent cueing to re-focus on task.  Pt continues to need assist and increased time and cueing for performance of all mobility tasks and ambulation distance is limited by pain and premorbid deconditioning.    Follow Up Recommendations  Follow surgeon's recommendation for DC plan and follow-up therapies     Equipment Recommendations  None recommended by PT    Recommendations for Other Services       Precautions / Restrictions Precautions Precautions: Fall Restrictions Weight Bearing Restrictions: No Other Position/Activity Restrictions: WBAT    Mobility  Bed Mobility Overal bed mobility: Needs Assistance Bed Mobility: Sit to Supine     Supine to sit: Min assist Sit to supine: Min assist   General bed mobility comments: increased time with cues for sequence and use of L LE to self assist  Transfers Overall transfer level: Needs assistance Equipment used: Rolling walker (2 wheeled) Transfers: Sit to/from Stand Sit to Stand: Min assist         General transfer comment: cues for LE management and use of UEs to self assist  Ambulation/Gait Ambulation/Gait assistance: Min assist Gait Distance (Feet): 60 Feet Assistive device: Rolling walker (2 wheeled) Gait Pattern/deviations: Step-to pattern;Decreased step length - right;Decreased step length - left;Shuffle;Trunk flexed Gait velocity: decr   General Gait Details: incresaed time with cues for sequence, posture, stride length, to decrease R LE circumduction and position from RW.    Stairs             Wheelchair Mobility    Modified Rankin (Stroke Patients Only)       Balance Overall balance assessment: Mild deficits  observed, not formally tested                                          Cognition Arousal/Alertness: Awake/alert Behavior During Therapy: Anxious Overall Cognitive Status: Within Functional Limits for tasks assessed                                        Exercises Total Joint Exercises Ankle Circles/Pumps: AROM;Both;15 reps;Supine Quad Sets: AROM;Both;10 reps;Supine Heel Slides: AAROM;Right;20 reps;Supine Hip ABduction/ADduction: AAROM;Right;15 reps;Supine Long Arc Quad: AAROM;AROM;Right;10 reps;Seated    General Comments        Pertinent Vitals/Pain Pain Assessment: 0-10 Pain Score: 6  Pain Location: R hip Pain Descriptors / Indicators: Aching;Sore Pain Intervention(s): Limited activity within patient's tolerance;Monitored during session;Premedicated before session    Home Living                      Prior Function            PT Goals (current goals can now be found in the care plan section) Acute Rehab PT Goals Patient Stated Goal: regain IND PT Goal Formulation: With patient Time For Goal Achievement: 07/28/18 Potential to Achieve Goals: Good Progress towards PT goals: Progressing toward goals    Frequency    7X/week      PT Plan Current plan remains appropriate  Co-evaluation              AM-PAC PT "6 Clicks" Mobility   Outcome Measure  Help needed turning from your back to your side while in a flat bed without using bedrails?: A Little Help needed moving from lying on your back to sitting on the side of a flat bed without using bedrails?: A Little Help needed moving to and from a bed to a chair (including a wheelchair)?: A Little Help needed standing up from a chair using your arms (e.g., wheelchair or bedside chair)?: A Little Help needed to walk in hospital room?: A Little Help needed climbing 3-5 steps with a railing? : A Lot 6 Click Score: 17    End of Session Equipment Utilized During  Treatment: Gait belt Activity Tolerance: Patient tolerated treatment well;Other (comment) Patient left: in bed;with call bell/phone within reach;with family/visitor present Nurse Communication: Mobility status PT Visit Diagnosis: Difficulty in walking, not elsewhere classified (R26.2)     Time: 7793-9030 PT Time Calculation (min) (ACUTE ONLY): 41 min  Charges:  $Gait Training: 8-22 mins $Therapeutic Exercise: 8-22 mins $Therapeutic Activity: 8-22 mins                     [place] Pager [phone] Office [phone]    Ellis Mehaffey 07/22/2018, 3:00 PM

## 2018-07-23 LAB — CBC
HCT: 37.4 % (ref 36.0–46.0)
Hemoglobin: 11.8 g/dL — ABNORMAL LOW (ref 12.0–15.0)
MCH: 30.8 pg (ref 26.0–34.0)
MCHC: 31.6 g/dL (ref 30.0–36.0)
MCV: 97.7 fL (ref 80.0–100.0)
Platelets: 181 10*3/uL (ref 150–400)
RBC: 3.83 MIL/uL — ABNORMAL LOW (ref 3.87–5.11)
RDW: 12.8 % (ref 11.5–15.5)
WBC: 12.2 10*3/uL — ABNORMAL HIGH (ref 4.0–10.5)
nRBC: 0 % (ref 0.0–0.2)

## 2018-07-23 LAB — BASIC METABOLIC PANEL
Anion gap: 4 — ABNORMAL LOW (ref 5–15)
BUN: 16 mg/dL (ref 8–23)
CO2: 27 mmol/L (ref 22–32)
Calcium: 9.3 mg/dL (ref 8.9–10.3)
Chloride: 109 mmol/L (ref 98–111)
Creatinine, Ser: 0.81 mg/dL (ref 0.44–1.00)
GFR calc Af Amer: >60 mL/min (ref >60)
GFR calc non Af Amer: >60 mL/min (ref >60)
Glucose, Bld: 120 mg/dL — ABNORMAL HIGH (ref 70–99)
Potassium: 4.2 mmol/L (ref 3.5–5.1)
Sodium: 140 mmol/L (ref 135–145)

## 2018-07-23 NOTE — Care Management Important Message (Signed)
Important Message  Patient Details  Name: [name] [name] MRN: [id number] Date of Birth: [birth year]   Medicare Important Message Given:  Yes    Kerin Salen 07/23/2018, 10:37 AMImportant Message  Patient Details  Name: [name] [name] MRN: [id number] Date of Birth: Oct 01, 1940   Medicare Important Message Given:  Yes    Kerin Salen 07/23/2018, 10:37 AM

## 2018-07-23 NOTE — Progress Notes (Signed)
   Subjective: 2 Days Post-Op Procedure(s) (LRB): TOTAL HIP ARTHROPLASTY ANTERIOR APPROACH (Right) Patient reports pain as mild.   Patient seen in rounds by Dr. Wynelle Link. Patient is well, and has had no acute complaints or problems. States she is ready to go home. Voiding without difficulty and positive flatus. Denies chest pain or SOB. No issues overnight.  Plan is to go Home after hospital stay.  Objective: Vital signs in last 24 hours: Temp:  [97.6 F (36.4 C)-98.6 F (37 C)] 98.3 F (36.8 C) (02/28 0620) Pulse Rate:  [76-80] 76 (02/28 0620) Resp:  [15-16] 16 (02/27 2159) BP: (127-161)/(75-92) 161/92 (02/28 0620) SpO2:  [94 %-98 %] 98 % (02/28 0620)  Intake/Output from previous day:  Intake/Output Summary (Last 24 hours) at 07/23/2018 0708 Last data filed at 07/23/2018 0200 Gross per 24 hour  Intake 1017.63 ml  Output 1700 ml  Net -682.37 ml    Labs: Recent Labs    07/22/18 0453 07/23/18 0435  HGB 11.8* 11.8*   Recent Labs    07/22/18 0453 07/23/18 0435  WBC 13.6* 12.2*  RBC 3.78* 3.83*  HCT 36.3 37.4  PLT 175 181   Recent Labs    07/22/18 0453 07/23/18 0435  NA 137 140  K 4.2 4.2  CL 106 109  CO2 26 27  BUN 13 16  CREATININE 0.73 0.81  GLUCOSE 134* 120*  CALCIUM 9.0 9.3   Exam: General - Patient is Alert and Oriented Extremity - Neurologically intact Neurovascular intact Sensation intact distally Dorsiflexion/Plantar flexion intact Dressing/Incision - clean, dry, no drainage Motor Function - intact, moving foot and toes well on exam.   Past Medical History:  Diagnosis Date  . Arthritis    knees  . Depression   . Fatty liver   . GERD (gastroesophageal reflux disease)   . Hyperlipidemia   . Hypertension   . IBS (irritable bowel syndrome)   . Obesity   . Pneumonia   . Sleep apnea    uses CPAP    Assessment/Plan: 2 Days Post-Op Procedure(s) (LRB): TOTAL HIP ARTHROPLASTY ANTERIOR APPROACH (Right) Principal Problem:   OA  (osteoarthritis) of hip  Estimated body mass index is 35.14 kg/m as calculated from the following:   Height as of this encounter: 4\' 11"  (1.499 m).   Weight as of this encounter: 78.9 kg. Up with therapy D/C IV fluids  DVT Prophylaxis - Aspirin Weight-bearing as tolerated  Plan for discharge once meeting goals with therapy. Scheduled for HHPT. Follow-up in the office in 2 weeks.   Theresa Duty, PA-C Orthopedic Surgery 07/23/2018, 7:08 AM

## 2018-07-23 NOTE — Progress Notes (Signed)
Patient discharged to home w/ daughter. Given all belongings, instructions, equipment. Patient and family verbalized understanding of instructions. Escorted to pov via w/c.

## 2018-07-23 NOTE — Progress Notes (Signed)
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  Pt/daughter verbalize comfort with all. Will sign off  Follow Up Recommendations  Home health OT    Equipment Recommendations  None recommended by OT    Recommendations for Other Services      Precautions / Restrictions Precautions Precautions: Fall Restrictions Weight Bearing Restrictions: No Other Position/Activity Restrictions: WBAT       Mobility Bed Mobility                  Transfers                      Balance                                           ADL either performed or assessed with clinical judgement   ADL                                         General ADL Comments: demonstrated sequence for stepping into shower stall and placement of shower chair across from shower door.  Handout given.   She has a hand held shower.  Showed daughter how to adjust the 3:1 and add splash guard to make up the distance between toilet and 3:1 seats.  Pt feels good about education completed yesterday.  Reviewed hand placement for sit to stand. Pt did not feel she needed to practice shower stall transfer. Reviewed general safety with RW     Vision       Perception     Praxis      Cognition Arousal/Alertness: Awake/alert Behavior During Therapy: WFL for tasks assessed/performed Overall Cognitive Status: Within Functional Limits for tasks assessed                                          Exercises     Shoulder Instructions       General Comments      Pertinent Vitals/ Pain       Pain Score: 4  Pain Location: R hip Pain Descriptors / Indicators: Aching;Sore Pain Intervention(s):  Limited activity within patient's tolerance;Monitored during session  Home Living                                          Prior Functioning/Environment              Frequency           Progress Toward Goals  OT Goals(current goals can now be found in the care plan section)  Progress towards OT goals: Progressing toward goals(pt/daughter verbalize understanding of all education)     Plan      Co-evaluation                 AM-PAC OT "6 Clicks" Daily Activity  Outcome Measure   Help from another person eating meals?: None Help from another person taking care of personal grooming?: A Little Help from another person toileting, which includes using toliet, bedpan, or urinal?: A Little Help from another person bathing (including washing, rinsing, drying)?: A Lot Help from another person to put on and taking off regular upper body clothing?: A Little Help from another person to put on and taking off regular lower body clothing?: A Little 6 Click Score: 18    End of Session    OT Visit Diagnosis: Other abnormalities of gait and mobility (R26.89);Pain Pain - Right/Left: Right   Activity Tolerance Patient tolerated treatment well   Patient Left in chair;with call bell/phone within reach;with family/visitor present   Nurse Communication          Time: 0034-9179 OT Time Calculation (min): 17 min  Charges: OT General Charges $OT Visit: 1 Visit OT Treatments $Self Care/Home Management : 8-22 mins  Lesle Chris, OTR/L Acute Rehabilitation Services [phone] [place] pager [phone] office 07/23/2018   [place] 07/23/2018, 11:15 AM

## 2018-07-23 NOTE — Progress Notes (Signed)
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  This am pt ambulated in hall (decreased difficulty with shoe on non-operative LE to equalize leg length), negotiated stairs, reviewed bed mobility and reviewed car transfers - dr present for family education.   Follow Up Recommendations  Follow surgeon's recommendation for DC plan and follow-up therapies     Equipment Recommendations  None recommended by PT    Recommendations for Other Services       Precautions / Restrictions Precautions Precautions: Fall Restrictions Weight Bearing Restrictions: No Other Position/Activity Restrictions: WBAT    Mobility  Bed Mobility Overal bed mobility: Needs Assistance Bed Mobility: Supine to Sit;Sit to Supine     Supine to sit: Min guard Sit to supine: Min guard   General bed mobility comments: increased time, cues for sequencing and guiding of LLE- pt utilizing strap to self assist R LE  Transfers Overall transfer level: Needs assistance Equipment used: Rolling walker (2 wheeled) Transfers: Sit to/from Stand Sit to Stand: Min guard         General transfer comment: cues for LE mangement and use of UEs to self assist; steady assist for balance  Ambulation/Gait Ambulation/Gait assistance: Min assist;Min guard Gait Distance (Feet): 75 Feet Assistive device: Rolling walker (2 wheeled) Gait Pattern/deviations: Step-to pattern;Decreased step length - right;Decreased step length - left;Shuffle;Trunk flexed Gait velocity: decr   General Gait Details: increased time with cues for sequence, posture, stride length.  Place shoe on L foot only to equalize leg length - pt with increased ease to advance L LE and noted decreased hip circumduction   Stairs Stairs: Yes Stairs  assistance: Min assist Stair Management: No rails;One rail Left;Step to pattern;Forwards;With crutches;With cane Number of Stairs: 6 General stair comments: 2 steps with rail and cane; 2 steps wtih rail and crutch; 2 steps bkwd with RW; cues for sequence and foot/device placement; dtr present; written instruction provided.   Wheelchair Mobility    Modified Rankin (Stroke Patients Only)       Balance Overall balance assessment: Mild deficits observed, not formally tested                                          Cognition Arousal/Alertness: Awake/alert Behavior During Therapy: WFL for tasks assessed/performed Overall Cognitive Status: Within Functional Limits for tasks assessed                                        Exercises      General Comments        Pertinent Vitals/Pain Pain Assessment: 0-10 Pain Score: 6  Pain Location: R hip with WB Pain Descriptors / Indicators: Aching;Sore Pain Intervention(s): Limited activity within patient's tolerance;Monitored during session;Premedicated before session;Ice applied    Home Living                      Prior Function            PT Goals (current goals can now be found in the care plan section) Acute Rehab PT Goals Patient Stated  Goal: to get back to moving around without pain PT Goal Formulation: With patient Time For Goal Achievement: 07/28/18 Potential to Achieve Goals: Good Progress towards PT goals: Progressing toward goals    Frequency    7X/week      PT Plan Current plan remains appropriate    Co-evaluation              AM-PAC PT "6 Clicks" Mobility   Outcome Measure  Help needed turning from your back to your side while in a flat bed without using bedrails?: A Little Help needed moving from lying on your back to sitting on the side of a flat bed without using bedrails?: A Little Help needed moving to and from a bed to a chair (including a  wheelchair)?: A Little Help needed standing up from a chair using your arms (e.g., wheelchair or bedside chair)?: A Little Help needed to walk in hospital room?: A Little Help needed climbing 3-5 steps with a railing? : A Little 6 Click Score: 18    End of Session Equipment Utilized During Treatment: Gait belt Activity Tolerance: Patient tolerated treatment well Patient left: in chair;with call bell/phone within reach;with family/visitor present Nurse Communication: Mobility status PT Visit Diagnosis: Difficulty in walking, not elsewhere classified (R26.2)     Time: 1561-5379 PT Time Calculation (min) (ACUTE ONLY): 45 min  Charges:  $Gait Training: 8-22 mins $Therapeutic Activity: 23-37 mins                     Debe Coder PT Acute Rehabilitation Services Pager [phone] Office [phone]     Deng Kemler 07/23/2018, 12:49 PM

## 2018-07-24 DIAGNOSIS — I10 Essential (primary) hypertension: Secondary | ICD-10-CM | POA: Diagnosis not present

## 2018-07-24 DIAGNOSIS — F411 Generalized anxiety disorder: Secondary | ICD-10-CM | POA: Diagnosis not present

## 2018-07-24 DIAGNOSIS — Z6835 Body mass index (BMI) 35.0-35.9, adult: Secondary | ICD-10-CM | POA: Diagnosis not present

## 2018-07-24 DIAGNOSIS — E785 Hyperlipidemia, unspecified: Secondary | ICD-10-CM | POA: Diagnosis not present

## 2018-07-24 DIAGNOSIS — K219 Gastro-esophageal reflux disease without esophagitis: Secondary | ICD-10-CM | POA: Diagnosis not present

## 2018-07-24 DIAGNOSIS — K760 Fatty (change of) liver, not elsewhere classified: Secondary | ICD-10-CM | POA: Diagnosis not present

## 2018-07-24 DIAGNOSIS — E669 Obesity, unspecified: Secondary | ICD-10-CM | POA: Diagnosis not present

## 2018-07-24 DIAGNOSIS — K589 Irritable bowel syndrome without diarrhea: Secondary | ICD-10-CM | POA: Diagnosis not present

## 2018-07-24 DIAGNOSIS — Z96641 Presence of right artificial hip joint: Secondary | ICD-10-CM | POA: Diagnosis not present

## 2018-07-24 DIAGNOSIS — G4730 Sleep apnea, unspecified: Secondary | ICD-10-CM | POA: Diagnosis not present

## 2018-07-24 DIAGNOSIS — Z471 Aftercare following joint replacement surgery: Secondary | ICD-10-CM | POA: Diagnosis not present

## 2018-07-24 DIAGNOSIS — F329 Major depressive disorder, single episode, unspecified: Secondary | ICD-10-CM | POA: Diagnosis not present

## 2018-07-26 NOTE — Discharge Summary (Signed)
Physician Discharge Summary   Patient ID: [name] [name] MRN: [id number] DOB/AGE: 07/06/1940 78 y.o.  Admit date: 07/21/2018 Discharge date: 07/23/2018  Primary Diagnosis: Osteoarthritis, right hip   Admission Diagnoses:  Past Medical History:  Diagnosis Date  . Arthritis    knees  . Depression   . Fatty liver   . GERD (gastroesophageal reflux disease)   . Hyperlipidemia   . Hypertension   . IBS (irritable bowel syndrome)   . Obesity   . Pneumonia   . Sleep apnea    uses CPAP   Discharge Diagnoses:   Principal Problem:   OA (osteoarthritis) of hip  Estimated body mass index is 35.14 kg/m as calculated from the following:   Height as of this encounter: 4\' 11"  (1.499 m).   Weight as of this encounter: 78.9 kg.  Procedure:  Procedure(s) (LRB): TOTAL HIP ARTHROPLASTY ANTERIOR APPROACH (Right)   Consults: None  HPI: [name] [name] is a 78 y.o. female who has advanced end-stage arthritis of their Right  hip with progressively worsening pain and dysfunction.The patient has failed nonoperative management and presents for total hip arthroplasty.   Laboratory Data: Admission on 07/21/2018, Discharged on 07/23/2018  Component Date Value Ref Range Status  . WBC 07/22/2018 13.6* 4.0 - 10.5 K/uL Final  . RBC 07/22/2018 3.78* 3.87 - 5.11 MIL/uL Final  . Hemoglobin 07/22/2018 11.8* 12.0 - 15.0 g/dL Final  . HCT 07/22/2018 36.3  36.0 - 46.0 % Final  . MCV 07/22/2018 96.0  80.0 - 100.0 fL Final  . MCH 07/22/2018 31.2  26.0 - 34.0 pg Final  . MCHC 07/22/2018 32.5  30.0 - 36.0 g/dL Final  . RDW 07/22/2018 12.5  11.5 - 15.5 % Final  . Platelets 07/22/2018 175  150 - 400 K/uL Final  . nRBC 07/22/2018 0.0  0.0 - 0.2 % Final   Performed at [facility], [place] [address] [place], [place] [address]  . Sodium 07/22/2018 137  135 - 145 mmol/L Final  . Potassium 07/22/2018 4.2  3.5 - 5.1 mmol/L Final  . Chloride 07/22/2018 106  98 - 111 mmol/L Final  . CO2 07/22/2018 26   22 - 32 mmol/L Final  . Glucose, Bld 07/22/2018 134* 70 - 99 mg/dL Final  . BUN 07/22/2018 13  8 - 23 mg/dL Final  . Creatinine, Ser 07/22/2018 0.73  0.44 - 1.00 mg/dL Final  . Calcium 07/22/2018 9.0  8.9 - 10.3 mg/dL Final  . GFR calc non Af Amer 07/22/2018 >60  >60 mL/min Final  . GFR calc Af Amer 07/22/2018 >60  >60 mL/min Final  . Anion gap 07/22/2018 5  5 - 15 Final   Performed at [facility], [place] [address], [place], [place] [address]  . WBC 07/23/2018 12.2* 4.0 - 10.5 K/uL Final  . RBC 07/23/2018 3.83* 3.87 - 5.11 MIL/uL Final  . Hemoglobin 07/23/2018 11.8* 12.0 - 15.0 g/dL Final  . HCT 07/23/2018 37.4  36.0 - 46.0 % Final  . MCV 07/23/2018 97.7  80.0 - 100.0 fL Final  . MCH 07/23/2018 30.8  26.0 - 34.0 pg Final  . MCHC 07/23/2018 31.6  30.0 - 36.0 g/dL Final  . RDW 07/23/2018 12.8  11.5 - 15.5 % Final  . Platelets 07/23/2018 181  150 - 400 K/uL Final  . nRBC 07/23/2018 0.0  0.0 - 0.2 % Final   Performed at [facility], [place] [address], [place], [place] [address]  . Sodium 07/23/2018  140  135 - 145 mmol/L Final  . Potassium 07/23/2018 4.2  3.5 - 5.1 mmol/L Final  . Chloride 07/23/2018 109  98 - 111 mmol/L Final  . CO2 07/23/2018 27  22 - 32 mmol/L Final  . Glucose, Bld 07/23/2018 120* 70 - 99 mg/dL Final  . BUN 07/23/2018 16  8 - 23 mg/dL Final  . Creatinine, Ser 07/23/2018 0.81  0.44 - 1.00 mg/dL Final  . Calcium 07/23/2018 9.3  8.9 - 10.3 mg/dL Final  . GFR calc non Af Amer 07/23/2018 >60  >60 mL/min Final  . GFR calc Af Amer 07/23/2018 >60  >60 mL/min Final  . Anion gap 07/23/2018 4* 5 - 15 Final   Performed at [facility], [place] [address] [place], [place] [address]  Hospital Outpatient Visit on 07/19/2018  Component Date Value Ref Range Status  . aPTT 07/19/2018 30  24 - 36 seconds Final   Performed at [facility], [place] [address], [place], [place] [address]  . WBC 07/19/2018 7.0  4.0 - 10.5  K/uL Final  . RBC 07/19/2018 4.91  3.87 - 5.11 MIL/uL Final  . Hemoglobin 07/19/2018 15.4* 12.0 - 15.0 g/dL Final  . HCT 07/19/2018 46.4* 36.0 - 46.0 % Final  . MCV 07/19/2018 94.5  80.0 - 100.0 fL Final  . MCH 07/19/2018 31.4  26.0 - 34.0 pg Final  . MCHC 07/19/2018 33.2  30.0 - 36.0 g/dL Final  . RDW 07/19/2018 12.8  11.5 - 15.5 % Final  . Platelets 07/19/2018 216  150 - 400 K/uL Final  . nRBC 07/19/2018 0.0  0.0 - 0.2 % Final   Performed at [facility], [place] [address] [place], [place] [address]  . Sodium 07/19/2018 141  135 - 145 mmol/L Final  . Potassium 07/19/2018 4.5  3.5 - 5.1 mmol/L Final  . Chloride 07/19/2018 107  98 - 111 mmol/L Final  . CO2 07/19/2018 27  22 - 32 mmol/L Final  . Glucose, Bld 07/19/2018 102* 70 - 99 mg/dL Final  . BUN 07/19/2018 19  8 - 23 mg/dL Final  . Creatinine, Ser 07/19/2018 0.88  0.44 - 1.00 mg/dL Final  . Calcium 07/19/2018 9.8  8.9 - 10.3 mg/dL Final  . Total Protein 07/19/2018 7.3  6.5 - 8.1 g/dL Final  . Albumin 07/19/2018 4.3  3.5 - 5.0 g/dL Final  . AST 07/19/2018 25  15 - 41 U/L Final  . ALT 07/19/2018 18  0 - 44 U/L Final  . Alkaline Phosphatase 07/19/2018 96  38 - 126 U/L Final  . Total Bilirubin 07/19/2018 0.6  0.3 - 1.2 mg/dL Final  . GFR calc non Af Amer 07/19/2018 >60  >60 mL/min Final  . GFR calc Af Amer 07/19/2018 >60  >60 mL/min Final  . Anion gap 07/19/2018 7  5 - 15 Final   Performed at [facility], [place] [address], [place], [place] [address]  . Prothrombin Time 07/19/2018 12.7  11.4 - 15.2 seconds Final  . INR 07/19/2018 1.0   Final   Performed at [place] [address] [place], [place] [address]  . ABO/RH(D) 07/19/2018 A POS   Final  . Antibody Screen 07/19/2018 NEG   Final  . Sample Expiration 07/19/2018 07/24/2018   Final  . Extend sample reason 07/19/2018    Final                   Value:NO TRANSFUSIONS OR  PREGNANCY IN THE PAST 3 MONTHS Performed at [place] [address], [place], [place] [address]   . MRSA, PCR 07/19/2018 NEGATIVE  NEGATIVE Final  . Staphylococcus aureus 07/19/2018 NEGATIVE  NEGATIVE Final   Comment: (NOTE) The Xpert SA Assay (FDA approved for NASAL specimens in patients 31 years of age and older), is one component of a comprehensive surveillance program. It is not intended to diagnose infection nor to guide or monitor treatment. Performed at [facility], [place] [address], [place], [place] [address]   . ABO/RH(D) 07/19/2018    Final                   Value:A POS Performed at [facility], [place] [address] [place], [place] [address]   Office Visit on 06/17/2018  Component Date Value Ref Range Status  . WBC 06/17/2018 6.3  4.0 - 10.5 K/uL Final  . RBC 06/17/2018 4.84  3.87 - 5.11 Mil/uL Final  . Hemoglobin 06/17/2018 15.2* 12.0 - 15.0 g/dL Final  . HCT 06/17/2018 44.8  36.0 - 46.0 % Final  . MCV 06/17/2018 92.7  78.0 - 100.0 fl Final  . MCHC 06/17/2018 34.0  30.0 - 36.0 g/dL Final  . RDW 06/17/2018 13.0  11.5 - 15.5 % Final  . Platelets 06/17/2018 272.0  150.0 - 400.0 K/uL Final  . Neutrophils Relative % 06/17/2018 66.1  43.0 - 77.0 % Final  . Lymphocytes Relative 06/17/2018 23.4  12.0 - 46.0 % Final  . Monocytes Relative 06/17/2018 8.0  3.0 - 12.0 % Final  . Eosinophils Relative 06/17/2018 1.6  0.0 - 5.0 % Final  . Basophils Relative 06/17/2018 0.9  0.0 - 3.0 % Final  . Neutro Abs 06/17/2018 4.2  1.4 - 7.7 K/uL Final  . Lymphs Abs 06/17/2018 1.5  0.7 - 4.0 K/uL Final  . Monocytes Absolute 06/17/2018 0.5  0.1 - 1.0 K/uL Final  . Eosinophils Absolute 06/17/2018 0.1  0.0 - 0.7 K/uL Final  . Basophils Absolute 06/17/2018 0.1  0.0 - 0.1 K/uL Final  . Sodium 06/17/2018 140  135 - 145 mEq/L Final  . Potassium 06/17/2018 4.1  3.5 - 5.1 mEq/L Final  . Chloride 06/17/2018 105  96 - 112 mEq/L Final  . CO2 06/17/2018 27  19 - 32 mEq/L Final  . Glucose, Bld  06/17/2018 101* 70 - 99 mg/dL Final  . BUN 06/17/2018 20  6 - 23 mg/dL Final  . Creatinine, Ser 06/17/2018 0.94  0.40 - 1.20 mg/dL Final  . Total Bilirubin 06/17/2018 0.5  0.2 - 1.2 mg/dL Final  . Alkaline Phosphatase 06/17/2018 108  39 - 117 U/L Final  . AST 06/17/2018 18  0 - 37 U/L Final  . ALT 06/17/2018 14  0 - 35 U/L Final  . Total Protein 06/17/2018 7.0  6.0 - 8.3 g/dL Final  . Albumin 06/17/2018 4.3  3.5 - 5.2 g/dL Final  . Calcium 06/17/2018 10.3  8.4 - 10.5 mg/dL Final  . GFR 06/17/2018 57.93* >60.00 mL/min Final     X-Rays:Dg Pelvis Portable  Result Date: 07/21/2018 CLINICAL DATA:  Post op right total hip replacement EXAM: PORTABLE PELVIS 1-2 VIEWS COMPARISON:  None. FINDINGS: Right hip arthroplasty components project in expected location. No fracture or dislocation. Surgical drain projects lateral to the right hip. Mild osteitis pubis. IMPRESSION: Right hip arthroplasty without apparent complication. Electronically Signed   By: Lucrezia Europe M.D.   On: 07/21/2018 12:18   Dg  C-arm 1-60 Min-no Report  Result Date: 07/21/2018 Fluoroscopy was utilized by the requesting physician.  No radiographic interpretation.   Dg Hip Operative Unilat W Or W/o Pelvis Right  Result Date: 07/21/2018 CLINICAL DATA:  Status post right total hip arthroplasty. EXAM: OPERATIVE right HIP (WITH PELVIS IF PERFORMED) 1 VIEWS TECHNIQUE: Fluoroscopic spot image(s) were submitted for interpretation post-operatively. COMPARISON:  None. FINDINGS: Interval right total hip arthroplasty. The hardware components are in anatomic alignment. No periprosthetic fracture or subluxation noted. IMPRESSION: 1. Status post right total hip arthroplasty. Electronically Signed   By: Kerby Moors M.D.   On: 07/21/2018 08:53    EKG: Orders placed or performed during the hospital encounter of 07/19/18  . EKG 12 lead  . EKG 12 lead     Hospital Course: [name] [name] is a 78 y.o. who was admitted to [facility]. They  were brought to the operating room on 07/21/2018 and underwent Procedure(s): [place].  Patient tolerated the procedure well and was later transferred to the recovery room and then to the orthopaedic floor for postoperative care. They were given PO and IV analgesics for pain control following their surgery. They were given 24 hours of postoperative antibiotics of  Anti-infectives (From admission, onward)   Start     Dose/Rate Route Frequency Ordered Stop   07/21/18 1400  ceFAZolin (ANCEF) IVPB 2g/100 mL premix     2 g 200 mL/hr over 30 Minutes Intravenous Every 6 hours 07/21/18 1046 07/21/18 2033   07/21/18 0600  ceFAZolin (ANCEF) IVPB 2g/100 mL premix     2 g 200 mL/hr over 30 Minutes Intravenous On call to O.R. 07/21/18 0932 07/21/18 0716     and started on DVT prophylaxis in the form of Aspirin.   PT and OT were ordered for total joint protocol. Discharge planning consulted to help with postop disposition and equipment needs. Patient had a decent night on the evening of surgery. They started to get up OOB with therapy on POD #0. Hemovac drain was pulled without difficulty on day one. Pt had slower progression with therapy and required an additional night in the hospital to continue working on her goals. Pt was seen during rounds on day two and was ready to go home pending progress with therapy. Dressing was changed and the incision was clean, dry, and intact with no drainage. Pt worked with therapy for one additional session and was meeting their goals. She was discharged to home later that day in stable condition.  Diet: Regular diet Activity: WBAT Follow-up: in 2 weeks Disposition: Home with HHPT Discharged Condition: stable   Discharge Instructions    Call MD / Call 911   Complete by:  As directed    If you experience chest pain or shortness of breath, CALL 911 and be transported to the hospital emergency room.  If you develope a fever above 101 F, pus  (white drainage) or increased drainage or redness at the wound, or calf pain, call your surgeon's office.   Change dressing   Complete by:  As directed    You may change your dressing on Friday, then change the dressing daily with sterile 4 x 4 inch gauze dressing and paper tape.   Constipation Prevention   Complete by:  As directed    Drink plenty of fluids.  Prune juice may be helpful.  You may use a stool softener, such as Colace (over the counter) 100 mg twice a day.  Use  MiraLax (over the counter) for constipation as needed.   Diet - low sodium heart healthy   Complete by:  As directed    Discharge instructions   Complete by:  As directed    Dr. Gaynelle Arabian Total Joint Specialist [facility] [address], [place], [place] [address] [phone]  ANTERIOR APPROACH TOTAL HIP REPLACEMENT POSTOPERATIVE DIRECTIONS   Hip Rehabilitation, Guidelines Following Surgery  The results of a hip operation are greatly improved after range of motion and muscle strengthening exercises. Follow all safety measures which are given to protect your hip. If any of these exercises cause increased pain or swelling in your joint, decrease the amount until you are comfortable again. Then slowly increase the exercises. Call your caregiver if you have problems or questions.   HOME CARE INSTRUCTIONS  Remove items at home which could result in a fall. This includes throw rugs or furniture in walking pathways.  ICE to the affected hip every three hours for 30 minutes at a time and then as needed for pain and swelling.  Continue to use ice on the hip for pain and swelling from surgery. You may notice swelling that will progress down to the foot and ankle.  This is normal after surgery.  Elevate the leg when you are not up walking on it.   Continue to use the breathing machine which will help keep your temperature down.  It is common for your temperature to cycle up and down following surgery,  especially at night when you are not up moving around and exerting yourself.  The breathing machine keeps your lungs expanded and your temperature down.  DIET You may resume your previous home diet once your are discharged from the hospital.  DRESSING / WOUND CARE / SHOWERING You may shower 3 days after surgery, but keep the wounds dry during showering.  You may use an occlusive plastic wrap (Press'n Seal for example), NO SOAKING/SUBMERGING IN THE BATHTUB.  If the bandage gets wet, change with a clean dry gauze.  If the incision gets wet, pat the wound dry with a clean towel. You may start showering once you are discharged home but do not submerge the incision under water. Just pat the incision dry and apply a dry gauze dressing on daily. Change the surgical dressing daily and reapply a dry dressing each time.  ACTIVITY Walk with your walker as instructed. Use walker as long as suggested by your caregivers. Avoid periods of inactivity such as sitting longer than an hour when not asleep. This helps prevent blood clots.  You may resume a sexual relationship in one month or when given the OK by your doctor.  You may return to work once you are cleared by your doctor.  Do not drive a car for 6 weeks or until released by you surgeon.  Do not drive while taking narcotics.  WEIGHT BEARING Weight bearing as tolerated with assist device (walker, cane, etc) as directed, use it as long as suggested by your surgeon or therapist, typically at least 4-6 weeks.  POSTOPERATIVE CONSTIPATION PROTOCOL Constipation - defined medically as fewer than three stools per week and severe constipation as less than one stool per week.  One of the most common issues patients have following surgery is constipation.  Even if you have a regular bowel pattern at home, your normal regimen is likely to be disrupted due to multiple reasons following surgery.  Combination of anesthesia, postoperative narcotics, change in  appetite and fluid  intake all can affect your bowels.  In order to avoid complications following surgery, here are some recommendations in order to help you during your recovery period.  Colace (docusate) - Pick up an over-the-counter form of Colace or another stool softener and take twice a day as long as you are requiring postoperative pain medications.  Take with a full glass of water daily.  If you experience loose stools or diarrhea, hold the colace until you stool forms back up.  If your symptoms do not get better within 1 week or if they get worse, check with your doctor.  Dulcolax (bisacodyl) - Pick up over-the-counter and take as directed by the product packaging as needed to assist with the movement of your bowels.  Take with a full glass of water.  Use this product as needed if not relieved by Colace only.   MiraLax (polyethylene glycol) - Pick up over-the-counter to have on hand.  MiraLax is a solution that will increase the amount of water in your bowels to assist with bowel movements.  Take as directed and can mix with a glass of water, juice, soda, coffee, or tea.  Take if you go more than two days without a movement. Do not use MiraLax more than once per day. Call your doctor if you are still constipated or irregular after using this medication for 7 days in a row.  If you continue to have problems with postoperative constipation, please contact the office for further assistance and recommendations.  If you experience "the worst abdominal pain ever" or develop nausea or vomiting, please contact the office immediatly for further recommendations for treatment.  ITCHING  If you experience itching with your medications, try taking only a single pain pill, or even half a pain pill at a time.  You can also use Benadryl over the counter for itching or also to help with sleep.   TED HOSE STOCKINGS Wear the elastic stockings on both legs for three weeks following surgery during the day but you  may remove then at night for sleeping.  MEDICATIONS See your medication summary on the "After Visit Summary" that the nursing staff will review with you prior to discharge.  You may have some home medications which will be placed on hold until you complete the course of blood thinner medication.  It is important for you to complete the blood thinner medication as prescribed by your surgeon.  Continue your approved medications as instructed at time of discharge.  PRECAUTIONS If you experience chest pain or shortness of breath - call 911 immediately for transfer to the hospital emergency department.  If you develop a fever greater that 101 F, purulent drainage from wound, increased redness or drainage from wound, foul odor from the wound/dressing, or calf pain - CONTACT YOUR SURGEON.                                                   FOLLOW-UP APPOINTMENTS Make sure you keep all of your appointments after your operation with your surgeon and caregivers. You should call the office at the above phone number and make an appointment for approximately two weeks after the date of your surgery or on the date instructed by your surgeon outlined in the "After Visit Summary".  RANGE OF MOTION AND STRENGTHENING EXERCISES  These exercises are designed to help  you keep full movement of your hip joint. Follow your caregiver's or physical therapist's instructions. Perform all exercises about fifteen times, three times per day or as directed. Exercise both hips, even if you have had only one joint replacement. These exercises can be done on a training (exercise) mat, on the floor, on a table or on a bed. Use whatever works the best and is most comfortable for you. Use music or television while you are exercising so that the exercises are a pleasant break in your day. This will make your life better with the exercises acting as a break in routine you can look forward to.  Lying on your back, slowly slide your foot toward  your buttocks, raising your knee up off the floor. Then slowly slide your foot back down until your leg is straight again.  Lying on your back spread your legs as far apart as you can without causing discomfort.  Lying on your side, raise your upper leg and foot straight up from the floor as far as is comfortable. Slowly lower the leg and repeat.  Lying on your back, tighten up the muscle in the front of your thigh (quadriceps muscles). You can do this by keeping your leg straight and trying to raise your heel off the floor. This helps strengthen the largest muscle supporting your knee.  Lying on your back, tighten up the muscles of your buttocks both with the legs straight and with the knee bent at a comfortable angle while keeping your heel on the floor.   IF YOU ARE TRANSFERRED TO A SKILLED REHAB FACILITY If the patient is transferred to a skilled rehab facility following release from the hospital, a list of the current medications will be sent to the facility for the patient to continue.  When discharged from the skilled rehab facility, please have the facility set up the patient's [place] prior to being released. Also, the skilled facility will be responsible for providing the patient with their medications at time of release from the facility to include their pain medication, the muscle relaxants, and their blood thinner medication. If the patient is still at the rehab facility at time of the two week follow up appointment, the skilled rehab facility will also need to assist the patient in arranging follow up appointment in our office and any transportation needs.  MAKE SURE YOU:  Understand these instructions.  Get help right away if you are not doing well or get worse.    Pick up stool softner and laxative for home use following surgery while on pain medications. Do not submerge incision under water. Please use good hand washing techniques while changing dressing each  day. May shower starting three days after surgery. Please use a clean towel to pat the incision dry following showers. Continue to use ice for pain and swelling after surgery. Do not use any lotions or creams on the incision until instructed by your surgeon.   Do not sit on low chairs, stoools or toilet seats, as it may be difficult to get up from low surfaces   Complete by:  As directed    Driving restrictions   Complete by:  As directed    No driving for two weeks   TED hose   Complete by:  As directed    Use stockings (TED hose) for three weeks on both leg(s).  You may remove them at night for sleeping.   Weight bearing as tolerated   Complete  by:  As directed      Allergies as of 07/23/2018      Reactions   Hydrocodone Nausea Only   Noted after surgery      Medication List    STOP taking these medications   meloxicam 15 MG tablet Commonly known as:  MOBIC     TAKE these medications   acetaminophen 500 MG tablet Commonly known as:  TYLENOL Take 500 mg by mouth every 6 (six) hours as needed for moderate pain.   aspirin 325 MG EC tablet Take 1 tablet (325 mg total) by mouth 2 (two) times daily for 20 days. Then take one 81 mg aspirin once a day for three weeks. Then discontinue aspirin.   atenolol 50 MG tablet Commonly known as:  TENORMIN Take 1 tablet (50 mg total) by mouth 2 (two) times daily.   Biotin 10000 MCG Tabs Take 10,000 mcg by mouth daily.   cholecalciferol 1000 units tablet Commonly known as:  VITAMIN D Take 1,000 Units by mouth daily.   clobetasol ointment 0.05 % Commonly known as:  TEMOVATE Apply 1 application topically daily as needed (for itching).   fluticasone 50 MCG/ACT nasal spray Commonly known as:  FLONASE Place 2 sprays into both nostrils daily as needed for allergies or rhinitis.   GLUCOSAMINE CHONDR 1500 COMPLX PO Take 1 tablet by mouth 2 (two) times daily.   hyoscyamine 0.125 MG tablet Commonly known as:  LEVSIN, ANASPAZ Take 1  tablet (0.125 mg total) by mouth 3 (three) times daily as needed.   loperamide 2 MG capsule Commonly known as:  IMODIUM Take 2 mg by mouth as needed for diarrhea or loose stools.   methocarbamol 500 MG tablet Commonly known as:  ROBAXIN Take 1 tablet (500 mg total) by mouth every 6 (six) hours as needed for muscle spasms.   oxyCODONE 5 MG immediate release tablet Commonly known as:  Oxy IR/ROXICODONE Take 1 tablet (5 mg total) by mouth every 6 (six) hours as needed for severe pain.   traMADol 50 MG tablet Commonly known as:  ULTRAM Take 1-2 tablets (50-100 mg total) by mouth every 6 (six) hours as needed for moderate pain (refractory to oxycodone). What changed:    how much to take  when to take this  reasons to take this  additional instructions            Discharge Care Instructions  (From admission, onward)         Start     Ordered   07/22/18 0000  Weight bearing as tolerated     07/22/18 0816   07/22/18 0000  Change dressing    Comments:  You may change your dressing on Friday, then change the dressing daily with sterile 4 x 4 inch gauze dressing and paper tape.   07/22/18 0816         Follow-up Information    Gaynelle Arabian, MD. Schedule an appointment as soon as possible for a visit on 08/05/2018.   Specialty:  Orthopedic Surgery Contact information: [address] [place] [address] [phone]           Signed: Theresa Duty, PA-C Orthopedic Surgery 07/26/2018, 7:46 AM

## 2018-08-02 DIAGNOSIS — E785 Hyperlipidemia, unspecified: Secondary | ICD-10-CM | POA: Diagnosis not present

## 2018-08-02 DIAGNOSIS — K589 Irritable bowel syndrome without diarrhea: Secondary | ICD-10-CM | POA: Diagnosis not present

## 2018-08-02 DIAGNOSIS — K219 Gastro-esophageal reflux disease without esophagitis: Secondary | ICD-10-CM | POA: Diagnosis not present

## 2018-08-02 DIAGNOSIS — Z471 Aftercare following joint replacement surgery: Secondary | ICD-10-CM | POA: Diagnosis not present

## 2018-08-02 DIAGNOSIS — K760 Fatty (change of) liver, not elsewhere classified: Secondary | ICD-10-CM | POA: Diagnosis not present

## 2018-08-02 DIAGNOSIS — G4730 Sleep apnea, unspecified: Secondary | ICD-10-CM | POA: Diagnosis not present

## 2018-08-02 DIAGNOSIS — F411 Generalized anxiety disorder: Secondary | ICD-10-CM | POA: Diagnosis not present

## 2018-08-02 DIAGNOSIS — Z96641 Presence of right artificial hip joint: Secondary | ICD-10-CM | POA: Diagnosis not present

## 2018-08-02 DIAGNOSIS — E669 Obesity, unspecified: Secondary | ICD-10-CM | POA: Diagnosis not present

## 2018-08-02 DIAGNOSIS — I10 Essential (primary) hypertension: Secondary | ICD-10-CM | POA: Diagnosis not present

## 2018-08-02 DIAGNOSIS — F329 Major depressive disorder, single episode, unspecified: Secondary | ICD-10-CM | POA: Diagnosis not present

## 2018-08-02 DIAGNOSIS — Z6835 Body mass index (BMI) 35.0-35.9, adult: Secondary | ICD-10-CM | POA: Diagnosis not present

## 2018-08-05 DIAGNOSIS — Z471 Aftercare following joint replacement surgery: Secondary | ICD-10-CM | POA: Diagnosis not present

## 2018-08-05 DIAGNOSIS — K219 Gastro-esophageal reflux disease without esophagitis: Secondary | ICD-10-CM | POA: Diagnosis not present

## 2018-08-05 DIAGNOSIS — F329 Major depressive disorder, single episode, unspecified: Secondary | ICD-10-CM | POA: Diagnosis not present

## 2018-08-05 DIAGNOSIS — F411 Generalized anxiety disorder: Secondary | ICD-10-CM | POA: Diagnosis not present

## 2018-08-05 DIAGNOSIS — K760 Fatty (change of) liver, not elsewhere classified: Secondary | ICD-10-CM | POA: Diagnosis not present

## 2018-08-05 DIAGNOSIS — E669 Obesity, unspecified: Secondary | ICD-10-CM | POA: Diagnosis not present

## 2018-08-05 DIAGNOSIS — Z96641 Presence of right artificial hip joint: Secondary | ICD-10-CM | POA: Diagnosis not present

## 2018-08-05 DIAGNOSIS — I10 Essential (primary) hypertension: Secondary | ICD-10-CM | POA: Diagnosis not present

## 2018-08-05 DIAGNOSIS — G4730 Sleep apnea, unspecified: Secondary | ICD-10-CM | POA: Diagnosis not present

## 2018-08-05 DIAGNOSIS — Z6835 Body mass index (BMI) 35.0-35.9, adult: Secondary | ICD-10-CM | POA: Diagnosis not present

## 2018-08-05 DIAGNOSIS — E785 Hyperlipidemia, unspecified: Secondary | ICD-10-CM | POA: Diagnosis not present

## 2018-08-05 DIAGNOSIS — K589 Irritable bowel syndrome without diarrhea: Secondary | ICD-10-CM | POA: Diagnosis not present

## 2018-08-24 ENCOUNTER — Ambulatory Visit: Payer: PPO

## 2018-08-24 DIAGNOSIS — Z96641 Presence of right artificial hip joint: Secondary | ICD-10-CM | POA: Diagnosis not present

## 2018-08-24 DIAGNOSIS — M1711 Unilateral primary osteoarthritis, right knee: Secondary | ICD-10-CM | POA: Diagnosis not present

## 2018-08-27 ENCOUNTER — Encounter: Payer: PPO | Admitting: Family Medicine

## 2018-09-28 DIAGNOSIS — M170 Bilateral primary osteoarthritis of knee: Secondary | ICD-10-CM | POA: Diagnosis not present

## 2018-09-28 DIAGNOSIS — M1711 Unilateral primary osteoarthritis, right knee: Secondary | ICD-10-CM | POA: Diagnosis not present

## 2018-09-28 DIAGNOSIS — Z471 Aftercare following joint replacement surgery: Secondary | ICD-10-CM | POA: Diagnosis not present

## 2018-09-28 DIAGNOSIS — M1712 Unilateral primary osteoarthritis, left knee: Secondary | ICD-10-CM | POA: Diagnosis not present

## 2018-09-28 DIAGNOSIS — Z96641 Presence of right artificial hip joint: Secondary | ICD-10-CM | POA: Diagnosis not present

## 2018-11-04 DIAGNOSIS — M1711 Unilateral primary osteoarthritis, right knee: Secondary | ICD-10-CM | POA: Diagnosis not present

## 2018-11-11 DIAGNOSIS — M1711 Unilateral primary osteoarthritis, right knee: Secondary | ICD-10-CM | POA: Diagnosis not present

## 2018-11-11 DIAGNOSIS — M25561 Pain in right knee: Secondary | ICD-10-CM | POA: Diagnosis not present

## 2018-11-18 DIAGNOSIS — M1711 Unilateral primary osteoarthritis, right knee: Secondary | ICD-10-CM | POA: Diagnosis not present

## 2018-11-19 DIAGNOSIS — H04221 Epiphora due to insufficient drainage, right lacrimal gland: Secondary | ICD-10-CM | POA: Diagnosis not present

## 2018-12-09 ENCOUNTER — Other Ambulatory Visit: Payer: Self-pay | Admitting: Family Medicine

## 2018-12-09 DIAGNOSIS — I10 Essential (primary) hypertension: Secondary | ICD-10-CM

## 2018-12-09 DIAGNOSIS — R739 Hyperglycemia, unspecified: Secondary | ICD-10-CM

## 2018-12-10 ENCOUNTER — Ambulatory Visit: Payer: PPO

## 2018-12-10 ENCOUNTER — Other Ambulatory Visit (INDEPENDENT_AMBULATORY_CARE_PROVIDER_SITE_OTHER): Payer: PPO

## 2018-12-10 DIAGNOSIS — R739 Hyperglycemia, unspecified: Secondary | ICD-10-CM | POA: Diagnosis not present

## 2018-12-10 DIAGNOSIS — I10 Essential (primary) hypertension: Secondary | ICD-10-CM | POA: Diagnosis not present

## 2018-12-10 LAB — CBC WITH DIFFERENTIAL/PLATELET
Basophils Absolute: 0.1 10*3/uL (ref 0.0–0.1)
Basophils Relative: 0.9 % (ref 0.0–3.0)
Eosinophils Absolute: 0.1 10*3/uL (ref 0.0–0.7)
Eosinophils Relative: 2.4 % (ref 0.0–5.0)
HCT: 43.9 % (ref 36.0–46.0)
Hemoglobin: 14.6 g/dL (ref 12.0–15.0)
Lymphocytes Relative: 25.6 % (ref 12.0–46.0)
Lymphs Abs: 1.5 10*3/uL (ref 0.7–4.0)
MCHC: 33.2 g/dL (ref 30.0–36.0)
MCV: 92.3 fl (ref 78.0–100.0)
Monocytes Absolute: 0.5 10*3/uL (ref 0.1–1.0)
Monocytes Relative: 8.5 % (ref 3.0–12.0)
Neutro Abs: 3.6 10*3/uL (ref 1.4–7.7)
Neutrophils Relative %: 62.6 % (ref 43.0–77.0)
Platelets: 234 10*3/uL (ref 150.0–400.0)
RBC: 4.76 Mil/uL (ref 3.87–5.11)
RDW: 13.5 % (ref 11.5–15.5)
WBC: 5.8 10*3/uL (ref 4.0–10.5)

## 2018-12-10 LAB — COMPREHENSIVE METABOLIC PANEL
ALT: 12 U/L (ref 0–35)
AST: 17 U/L (ref 0–37)
Albumin: 4.2 g/dL (ref 3.5–5.2)
Alkaline Phosphatase: 97 U/L (ref 39–117)
BUN: 15 mg/dL (ref 6–23)
CO2: 28 mEq/L (ref 19–32)
Calcium: 9.8 mg/dL (ref 8.4–10.5)
Chloride: 107 mEq/L (ref 96–112)
Creatinine, Ser: 0.88 mg/dL (ref 0.40–1.20)
GFR: 62.44 mL/min (ref >60.00)
Glucose, Bld: 99 mg/dL (ref 70–99)
Potassium: 4.2 mEq/L (ref 3.5–5.1)
Sodium: 141 mEq/L (ref 135–145)
Total Bilirubin: 0.5 mg/dL (ref 0.2–1.2)
Total Protein: 6.2 g/dL (ref 6.0–8.3)

## 2018-12-10 LAB — LIPID PANEL
Cholesterol: 244 mg/dL — ABNORMAL HIGH (ref 0–200)
HDL: 36.7 mg/dL — ABNORMAL LOW (ref >39.00)
NonHDL: 206.96
Total CHOL/HDL Ratio: 7
Triglycerides: 226 mg/dL — ABNORMAL HIGH (ref 0.0–149.0)
VLDL: 45.2 mg/dL — ABNORMAL HIGH (ref 0.0–40.0)

## 2018-12-10 LAB — LDL CHOLESTEROL, DIRECT: Direct LDL: 160 mg/dL

## 2018-12-10 LAB — HEMOGLOBIN A1C: Hgb A1c MFr Bld: 5.6 % (ref 4.6–6.5)

## 2018-12-16 ENCOUNTER — Other Ambulatory Visit: Payer: Self-pay

## 2018-12-16 ENCOUNTER — Ambulatory Visit (INDEPENDENT_AMBULATORY_CARE_PROVIDER_SITE_OTHER): Payer: PPO | Admitting: Family Medicine

## 2018-12-16 ENCOUNTER — Encounter: Payer: Self-pay | Admitting: Family Medicine

## 2018-12-16 VITALS — BP 128/76 | HR 80 | Temp 98.7°F | Ht 59.0 in | Wt 181.6 lb

## 2018-12-16 DIAGNOSIS — Z7189 Other specified counseling: Secondary | ICD-10-CM

## 2018-12-16 DIAGNOSIS — M2550 Pain in unspecified joint: Secondary | ICD-10-CM

## 2018-12-16 DIAGNOSIS — I10 Essential (primary) hypertension: Secondary | ICD-10-CM

## 2018-12-16 DIAGNOSIS — Z0000 Encounter for general adult medical examination without abnormal findings: Secondary | ICD-10-CM

## 2018-12-16 MED ORDER — ATENOLOL 50 MG PO TABS: 50.0000 mg | ORAL_TABLET | Freq: Two times a day (BID) | ORAL | 3 refills | Status: DC

## 2018-12-16 NOTE — Patient Instructions (Signed)
I would get a flu shot each fall.   Check with your insurance to see if they will cover the shingrix shot. Update me as needed.  Take care.  Glad to see you.

## 2018-12-16 NOTE — Progress Notes (Signed)
I have personally reviewed the Medicare Annual Wellness questionnaire and have noted 1. The patient's medical and social history 2. Their use of alcohol, tobacco or illicit drugs 3. Their current medications and supplements 4. The patient's functional ability including ADL's, fall risks, home safety risks and hearing or visual             impairment. 5. Diet and physical activities 6. Evidence for depression or mood disorders  The patients weight, height, BMI have been recorded in the chart and visual acuity is per eye clinic.  I have made referrals, counseling and provided education to the patient based review of the above and I have provided the pt with a written personalized care plan for preventive services.  Provider list updated- see scanned forms.  Routine anticipatory guidance given to patient.  See health maintenance. The possibility exists that previously documented standard health maintenance information may have been brought forward from a previous encounter into this note.  If needed, that same information has been updated to reflect the current situation based on today's encounter.    Flu 2019 Shingles discussed with patient. PNA up-to-date. Tetanus 2017. Colonoscopy 2016. Breast cancer screening 2019.  Follow-up pending. Bone density test 2017. Advance directive-daughter [name] [name] designated if patient were incapacitated. Cognitive function addressed- see scanned forms- and if abnormal then additional documentation follows.   Hypertension:    Using medication without problems or lightheadedness: yes Chest pain with exertion:no Edema:minimal ankle edema in the summertime, occ noted.   Short of breath:no Labs d/w pt.  Lipids worse after dec exercise with surgery and pandemic.    She has some loose stools with artificial sweeteners.  No blood stools.    She may end up having knee replacement but that may depend on covid considerations.  She had her hip surgery in  06/2018.  She had prev injections done.  Currently with OA limiting exercise.    PMH and SH reviewed  Meds, vitals, and allergies reviewed.   ROS: Per HPI.  Unless specifically indicated otherwise in HPI, the patient denies:  General: fever. Eyes: acute vision changes ENT: sore throat Cardiovascular: chest pain Respiratory: SOB GI: vomiting GU: dysuria Musculoskeletal: acute back pain Derm: acute rash Neuro: acute motor dysfunction Psych: worsening mood Endocrine: polydipsia Heme: bleeding Allergy: hayfever  GEN: nad, alert and oriented HEENT: ncat NECK: supple w/o LA CV: rrr. PULM: ctab, no inc wob ABD: soft, +bs EXT: no edema SKIN: no acute rash

## 2018-12-20 NOTE — Assessment & Plan Note (Signed)
She may end up having knee replacement but that may depend on covid considerations.  She had her hip surgery in 06/2018.  She had prev injections done.  Currently with OA limiting exercise.

## 2018-12-20 NOTE — Assessment & Plan Note (Signed)
Advance directive-daughter [name] designated if patient were incapacitated.

## 2018-12-20 NOTE — Assessment & Plan Note (Signed)
No change in meds for now.  She is going to try to work on diet and exercise as best she can Labs d/w pt.  Lipids worse after dec exercise with surgery and pandemic.

## 2018-12-20 NOTE — Assessment & Plan Note (Signed)
Flu 2019 Shingles discussed with patient. PNA up-to-date. Tetanus 2017. Colonoscopy 2016. Breast cancer screening 2019.  Follow-up pending. Bone density test 2017. Advance directive-daughter [name] designated if patient were incapacitated. Cognitive function addressed- see scanned forms- and if abnormal then additional documentation follows.

## 2018-12-30 DIAGNOSIS — Z1231 Encounter for screening mammogram for malignant neoplasm of breast: Secondary | ICD-10-CM | POA: Diagnosis not present

## 2018-12-30 DIAGNOSIS — Z6835 Body mass index (BMI) 35.0-35.9, adult: Secondary | ICD-10-CM | POA: Diagnosis not present

## 2018-12-30 DIAGNOSIS — M1711 Unilateral primary osteoarthritis, right knee: Secondary | ICD-10-CM | POA: Diagnosis not present

## 2018-12-30 DIAGNOSIS — Z01419 Encounter for gynecological examination (general) (routine) without abnormal findings: Secondary | ICD-10-CM | POA: Diagnosis not present

## 2018-12-30 DIAGNOSIS — R309 Painful micturition, unspecified: Secondary | ICD-10-CM | POA: Diagnosis not present

## 2018-12-30 DIAGNOSIS — R300 Dysuria: Secondary | ICD-10-CM | POA: Diagnosis not present

## 2019-01-05 DIAGNOSIS — G4733 Obstructive sleep apnea (adult) (pediatric): Secondary | ICD-10-CM | POA: Diagnosis not present

## 2019-01-10 DIAGNOSIS — D1800 Hemangioma unspecified site: Secondary | ICD-10-CM | POA: Diagnosis not present

## 2019-01-10 DIAGNOSIS — L814 Other melanin hyperpigmentation: Secondary | ICD-10-CM | POA: Diagnosis not present

## 2019-01-10 DIAGNOSIS — L689 Hypertrichosis, unspecified: Secondary | ICD-10-CM | POA: Diagnosis not present

## 2019-01-10 DIAGNOSIS — D225 Melanocytic nevi of trunk: Secondary | ICD-10-CM | POA: Diagnosis not present

## 2019-01-10 DIAGNOSIS — L603 Nail dystrophy: Secondary | ICD-10-CM | POA: Diagnosis not present

## 2019-01-10 DIAGNOSIS — L821 Other seborrheic keratosis: Secondary | ICD-10-CM | POA: Diagnosis not present

## 2019-01-10 DIAGNOSIS — Z1283 Encounter for screening for malignant neoplasm of skin: Secondary | ICD-10-CM | POA: Diagnosis not present

## 2019-01-10 DIAGNOSIS — L578 Other skin changes due to chronic exposure to nonionizing radiation: Secondary | ICD-10-CM | POA: Diagnosis not present

## 2019-01-10 DIAGNOSIS — D2230 Melanocytic nevi of unspecified part of face: Secondary | ICD-10-CM | POA: Diagnosis not present

## 2019-01-10 DIAGNOSIS — D229 Melanocytic nevi, unspecified: Secondary | ICD-10-CM | POA: Diagnosis not present

## 2019-01-10 DIAGNOSIS — L400 Psoriasis vulgaris: Secondary | ICD-10-CM | POA: Diagnosis not present

## 2019-02-02 DIAGNOSIS — G4733 Obstructive sleep apnea (adult) (pediatric): Secondary | ICD-10-CM | POA: Diagnosis not present

## 2019-02-04 DIAGNOSIS — M25561 Pain in right knee: Secondary | ICD-10-CM | POA: Diagnosis not present

## 2019-02-04 DIAGNOSIS — M1711 Unilateral primary osteoarthritis, right knee: Secondary | ICD-10-CM | POA: Diagnosis not present

## 2019-03-28 ENCOUNTER — Telehealth: Payer: Self-pay

## 2019-03-28 NOTE — Telephone Encounter (Addendum)
Pt calling and crying; pt sold her home to her son; and [place]; pt is to close on her home with her son mid Nov and pt had to pay out of her retirement money for home [place] and that money has to be paid back within a certain period of time . Pt has had critical decisions to make on a daily basis and today pt was overwhelmed pt stopped crying and pt does not want to see anyone other than Dr Damita Dunnings and on 03/29/19 pt has 3 other appts and prefers virtual appt either first or last thing on Dr Josefine Class schedule. Pt scheduled 30' virtual appt on 03/29/19 at 8 AM. No SI/HI. ED precautions given and pt voiced understanding.  FYI to Dr Damita Dunnings. (pt is aware if there is a change in Dr Josefine Class appt pt will receive call prior to appt.).

## 2019-03-28 NOTE — Telephone Encounter (Signed)
Noted. Thanks.

## 2019-03-29 ENCOUNTER — Ambulatory Visit (INDEPENDENT_AMBULATORY_CARE_PROVIDER_SITE_OTHER): Payer: PPO | Admitting: Family Medicine

## 2019-03-29 ENCOUNTER — Other Ambulatory Visit: Payer: Self-pay

## 2019-03-29 ENCOUNTER — Encounter: Payer: Self-pay | Admitting: Family Medicine

## 2019-03-29 DIAGNOSIS — Z659 Problem related to unspecified psychosocial circumstances: Secondary | ICD-10-CM | POA: Diagnosis not present

## 2019-03-29 MED ORDER — HYDROXYZINE HCL 10 MG PO TABS: 5.0000 mg | ORAL_TABLET | Freq: Three times a day (TID) | ORAL | 1 refills | Status: DC | PRN

## 2019-03-29 NOTE — Assessment & Plan Note (Signed)
D/w pt about options.  D/w pt about counseling, hydroxyzine prn, SSRI use daily.  She opted for prn hydroxyzine use with the plan to update me as needed.  Still okay for outpatient f/u.   />25 minutes spent in face to face time with patient, >50% spent in counselling or coordination of care

## 2019-03-29 NOTE — Progress Notes (Signed)
Virtual visit completed through WebEx or similar program Patient location: home  Provider location: Financial controller at [facility], office   Pandemic considerations d/w pt.   Limitations and rationale for visit method d/w patient.  Patient agreed to proceed.   CC: stress/anxiety.   HPI: She feels better today.  Stressors noted with revcent housing change.  She sold her house to her son.  She is moved to another location.  Her son already sold his house, so that accelerated his timeline but her payment was delayed.  Her son had to move in with her in the meantime, with his kids.  She is new at her new home now, in a stand alone house in a retirement community.  She is still independent.    Then she had trouble with cancelling a contract for the security system at her old home.  She has had multiple extra costs with the move that were stressful for her.    "I was getting a little bit antsy" over the weekend and she got tearful.  No SI/HI.  She is off sertraline, has been off for years.  She had gone to counseling with previous episodes of life stressors.  She has insomnia recently.    Her family is supportive.  She doesn't drink etoh except for rare occassions.    Meds and allergies reviewed.   ROS: Per HPI unless specifically indicated in ROS section   NAD Speech wnl Smiling.  Pleasant in conversation.  A/P: Other social stressor.  D/w pt about options.   D/w pt about counseling, hydroxyzine prn, SSRI use daily.  She opted for prn hydroxyzine use with the plan to update me as needed.  Still okay for outpatient f/u.   />25 minutes spent in face to face time with patient, >50% spent in counselling or coordination of care

## 2019-05-06 DIAGNOSIS — G4733 Obstructive sleep apnea (adult) (pediatric): Secondary | ICD-10-CM | POA: Diagnosis not present

## 2019-05-13 ENCOUNTER — Ambulatory Visit (INDEPENDENT_AMBULATORY_CARE_PROVIDER_SITE_OTHER): Payer: PPO | Admitting: Family Medicine

## 2019-05-13 ENCOUNTER — Other Ambulatory Visit: Payer: Self-pay

## 2019-05-13 ENCOUNTER — Encounter: Payer: Self-pay | Admitting: Family Medicine

## 2019-05-13 VITALS — BP 150/98 | HR 76 | Temp 96.8°F | Ht 59.0 in | Wt 188.2 lb

## 2019-05-13 DIAGNOSIS — H6980 Other specified disorders of Eustachian tube, unspecified ear: Secondary | ICD-10-CM

## 2019-05-13 NOTE — Patient Instructions (Signed)
Use flonase daily and gently try to pop your ears.  It should gradually get better.  Take care.  Glad to see you.

## 2019-05-13 NOTE — Progress Notes (Signed)
This visit occurred during the SARS-CoV-2 public health emergency.  Safety protocols were in place, including screening questions prior to the visit, additional usage of staff PPE, and extensive cleaning of exam room while observing appropriate contact time as indicated for disinfecting solutions.  She had L ear pain recently, intermittently.  Noted more at night.  Some allergy sx/sneezing but no fevers.  No R ear pain.  No ST, no cough.    We talked about kegel exercises.  She has tried but "I'm not doing them like I should."  They helped prev.   She has used hydroxyzine rarely.  Prev stressors d/w pt.   Meds, vitals, and allergies reviewed.   ROS: Per HPI unless specifically indicated in ROS section   GEN: nad, alert and oriented HEENT: ncat, tympanic membranes without erythema bilaterally but she does have left eustachian tube dysfunction noted on exam during Valsalva. NECK: supple w/o LA CV: rrr PULM: ctab, no inc wob

## 2019-05-15 DIAGNOSIS — H6980 Other specified disorders of Eustachian tube, unspecified ear: Secondary | ICD-10-CM | POA: Insufficient documentation

## 2019-05-15 NOTE — Assessment & Plan Note (Signed)
Discussed anatomy.  Restart Flonase.  Gently perform Valsalva.  Update me as needed.  She agrees.

## 2019-06-06 ENCOUNTER — Ambulatory Visit: Payer: Medicare Other | Attending: Internal Medicine

## 2019-06-06 DIAGNOSIS — Z23 Encounter for immunization: Secondary | ICD-10-CM | POA: Insufficient documentation

## 2019-06-06 NOTE — Progress Notes (Signed)
   Covid-19 Vaccination Clinic  Name:  [name] [name]    MRN: [id number] DOB: 15-Sep-1940  06/06/2019  [name] was observed post Covid-19 immunization for 15 minutes without incidence. She was provided with Vaccine Information Sheet and instruction to access the V-Safe system.   [name] was instructed to call 911 with any severe reactions post vaccine: [name] Kitchen Difficulty breathing  . Swelling of your face and throat  . A fast heartbeat  . A bad rash all over your body  . Dizziness and weakness    Immunizations Administered    Name Date Dose VIS Date Route   Pfizer COVID-19 Vaccine 06/06/2019 11:03 AM 0.3 mL 05/06/2019 Intramuscular   Manufacturer: [place]   Lot: [id number]   [place]: [id number]

## 2019-06-08 DIAGNOSIS — M1711 Unilateral primary osteoarthritis, right knee: Secondary | ICD-10-CM | POA: Diagnosis not present

## 2019-06-08 DIAGNOSIS — M545 Low back pain: Secondary | ICD-10-CM | POA: Diagnosis not present

## 2019-06-15 DIAGNOSIS — M545 Low back pain: Secondary | ICD-10-CM | POA: Diagnosis not present

## 2019-06-20 DIAGNOSIS — M545 Low back pain: Secondary | ICD-10-CM | POA: Diagnosis not present

## 2019-06-22 DIAGNOSIS — M5136 Other intervertebral disc degeneration, lumbar region: Secondary | ICD-10-CM | POA: Diagnosis not present

## 2019-06-22 DIAGNOSIS — M545 Low back pain: Secondary | ICD-10-CM | POA: Diagnosis not present

## 2019-06-23 DIAGNOSIS — M545 Low back pain: Secondary | ICD-10-CM | POA: Diagnosis not present

## 2019-06-24 ENCOUNTER — Ambulatory Visit: Payer: Medicare Other

## 2019-06-24 ENCOUNTER — Telehealth: Payer: Self-pay

## 2019-06-24 NOTE — Telephone Encounter (Signed)
Pt said she recently saw Dr Lucio's PA due to Lt.hip pain that moved to her back ( pt had hip surgery 1 yr ago). Pt said lower back vertebrae not having a lot of space and pt has hx of spinal stenosis. Pt was referred to DR Ramos and pt is to have MRI (pt thinks MRI is of the hip or back) on 06/29/19 for more detail of spinal stenosis.  Dr Nelva Bush did mention calcification in aortic area. Pt said years ago pt was screened for aortic aneurysm in 08-20-08 after death of her father and brother by aortic aneurysm. At that time pt was told aortic aneurysm was small and usually slow growing. Pt wants to know if MRI will show any more detail about aortic aneurysm or should pt have screening for aortic aneurysm. Pt request cb. Pt said not having problems to her knowledge. Pt said problems she is having now seem to be below the waist with hip and back.

## 2019-06-24 NOTE — Telephone Encounter (Signed)
Faxed requesting info.

## 2019-06-24 NOTE — Telephone Encounter (Signed)
I see u/s report from 2008 with abdominal aorta measures 2.3 cm in maximal diameter.   This was normal.  I don't see h/o AAA o/w in her records.    We may be able to get more information from the MR.  In the meantime, please call [name]' office to get the notes about the arotic calcification and let me see that.  We'll go from there.  Thanks.

## 2019-06-25 ENCOUNTER — Ambulatory Visit: Payer: Medicare HMO | Attending: Internal Medicine

## 2019-06-25 DIAGNOSIS — Z23 Encounter for immunization: Secondary | ICD-10-CM | POA: Insufficient documentation

## 2019-06-25 NOTE — Progress Notes (Signed)
   Covid-19 Vaccination Clinic  Name:  [name] [name]    MRN: [id number] DOB: 1940-08-30  06/25/2019  [name] was observed post Covid-19 immunization for 15 minutes without incidence. She was provided with Vaccine Information Sheet and instruction to access the V-Safe system.   [name] was instructed to call 911 with any severe reactions post vaccine: [name] Kitchen Difficulty breathing  . Swelling of your face and throat  . A fast heartbeat  . A bad rash all over your body  . Dizziness and weakness    Immunizations Administered    Name Date Dose VIS Date Route   Pfizer COVID-19 Vaccine 06/25/2019  1:31 PM 0.3 mL 05/06/2019 Intramuscular   Manufacturer: [place]   Lot: [id number]   [place]: [id number]

## 2019-06-27 DIAGNOSIS — M545 Low back pain: Secondary | ICD-10-CM | POA: Diagnosis not present

## 2019-06-27 NOTE — Telephone Encounter (Signed)
Pt left v/m requesting cb with status of call on 06/24/19 about aortic aneurysm.

## 2019-06-27 NOTE — Telephone Encounter (Signed)
I just don't know.  I will depend on the scanner and the field they can image.  I'll await that report and go from there.  Thanks.

## 2019-06-27 NOTE — Telephone Encounter (Signed)
Patient advised.

## 2019-06-27 NOTE — Telephone Encounter (Signed)
Patient advised that we are trying to secure records from [name].  Patient states she is having the MRI on Wednesday night at 7:30 and that [name] is checking for spinal stenosis.  Patient asks if this will likely show the abdominal aorta as well?

## 2019-06-29 ENCOUNTER — Encounter: Payer: Self-pay | Admitting: Family Medicine

## 2019-06-29 DIAGNOSIS — M545 Low back pain: Secondary | ICD-10-CM | POA: Diagnosis not present

## 2019-06-30 DIAGNOSIS — M545 Low back pain: Secondary | ICD-10-CM | POA: Diagnosis not present

## 2019-07-04 DIAGNOSIS — M545 Low back pain: Secondary | ICD-10-CM | POA: Diagnosis not present

## 2019-07-07 DIAGNOSIS — M545 Low back pain: Secondary | ICD-10-CM | POA: Diagnosis not present

## 2019-07-11 ENCOUNTER — Emergency Department (HOSPITAL_COMMUNITY): Payer: Medicare HMO

## 2019-07-11 ENCOUNTER — Encounter (HOSPITAL_COMMUNITY): Payer: Self-pay | Admitting: Emergency Medicine

## 2019-07-11 ENCOUNTER — Other Ambulatory Visit: Payer: Self-pay

## 2019-07-11 ENCOUNTER — Inpatient Hospital Stay (HOSPITAL_COMMUNITY)
Admission: EM | Admit: 2019-07-11 | Discharge: 2019-07-15 | DRG: 065 | Disposition: A | Discharge status: Home Health | Payer: Medicare HMO | Attending: Internal Medicine | Admitting: Internal Medicine

## 2019-07-11 DIAGNOSIS — R29701 NIHSS score 1: Secondary | ICD-10-CM | POA: Diagnosis present

## 2019-07-11 DIAGNOSIS — Z79899 Other long term (current) drug therapy: Secondary | ICD-10-CM | POA: Diagnosis not present

## 2019-07-11 DIAGNOSIS — Z96641 Presence of right artificial hip joint: Secondary | ICD-10-CM | POA: Diagnosis present

## 2019-07-11 DIAGNOSIS — I739 Peripheral vascular disease, unspecified: Secondary | ICD-10-CM | POA: Diagnosis present

## 2019-07-11 DIAGNOSIS — Z9841 Cataract extraction status, right eye: Secondary | ICD-10-CM

## 2019-07-11 DIAGNOSIS — I63233 Cerebral infarction due to unspecified occlusion or stenosis of bilateral carotid arteries: Secondary | ICD-10-CM | POA: Diagnosis not present

## 2019-07-11 DIAGNOSIS — R29810 Facial weakness: Secondary | ICD-10-CM | POA: Diagnosis present

## 2019-07-11 DIAGNOSIS — K219 Gastro-esophageal reflux disease without esophagitis: Secondary | ICD-10-CM | POA: Diagnosis present

## 2019-07-11 DIAGNOSIS — F329 Major depressive disorder, single episode, unspecified: Secondary | ICD-10-CM | POA: Diagnosis present

## 2019-07-11 DIAGNOSIS — E782 Mixed hyperlipidemia: Secondary | ICD-10-CM

## 2019-07-11 DIAGNOSIS — I639 Cerebral infarction, unspecified: Secondary | ICD-10-CM

## 2019-07-11 DIAGNOSIS — Z9049 Acquired absence of other specified parts of digestive tract: Secondary | ICD-10-CM | POA: Diagnosis not present

## 2019-07-11 DIAGNOSIS — Z9842 Cataract extraction status, left eye: Secondary | ICD-10-CM

## 2019-07-11 DIAGNOSIS — R471 Dysarthria and anarthria: Secondary | ICD-10-CM | POA: Diagnosis present

## 2019-07-11 DIAGNOSIS — Z961 Presence of intraocular lens: Secondary | ICD-10-CM | POA: Diagnosis present

## 2019-07-11 DIAGNOSIS — Z885 Allergy status to narcotic agent status: Secondary | ICD-10-CM | POA: Diagnosis not present

## 2019-07-11 DIAGNOSIS — M1711 Unilateral primary osteoarthritis, right knee: Secondary | ICD-10-CM | POA: Diagnosis not present

## 2019-07-11 DIAGNOSIS — M5440 Lumbago with sciatica, unspecified side: Secondary | ICD-10-CM | POA: Diagnosis present

## 2019-07-11 DIAGNOSIS — I672 Cerebral atherosclerosis: Secondary | ICD-10-CM | POA: Diagnosis present

## 2019-07-11 DIAGNOSIS — M5442 Lumbago with sciatica, left side: Secondary | ICD-10-CM | POA: Diagnosis not present

## 2019-07-11 DIAGNOSIS — Z7289 Other problems related to lifestyle: Secondary | ICD-10-CM

## 2019-07-11 DIAGNOSIS — E669 Obesity, unspecified: Secondary | ICD-10-CM | POA: Diagnosis present

## 2019-07-11 DIAGNOSIS — R29702 NIHSS score 2: Secondary | ICD-10-CM | POA: Diagnosis not present

## 2019-07-11 DIAGNOSIS — Z20822 Contact with and (suspected) exposure to covid-19: Secondary | ICD-10-CM | POA: Diagnosis present

## 2019-07-11 DIAGNOSIS — I63511 Cerebral infarction due to unspecified occlusion or stenosis of right middle cerebral artery: Principal | ICD-10-CM | POA: Diagnosis present

## 2019-07-11 DIAGNOSIS — G4733 Obstructive sleep apnea (adult) (pediatric): Secondary | ICD-10-CM | POA: Diagnosis present

## 2019-07-11 DIAGNOSIS — Z8249 Family history of ischemic heart disease and other diseases of the circulatory system: Secondary | ICD-10-CM

## 2019-07-11 DIAGNOSIS — G8194 Hemiplegia, unspecified affecting left nondominant side: Secondary | ICD-10-CM | POA: Diagnosis not present

## 2019-07-11 DIAGNOSIS — R531 Weakness: Secondary | ICD-10-CM | POA: Diagnosis not present

## 2019-07-11 DIAGNOSIS — K589 Irritable bowel syndrome without diarrhea: Secondary | ICD-10-CM | POA: Diagnosis present

## 2019-07-11 DIAGNOSIS — Z6835 Body mass index (BMI) 35.0-35.9, adult: Secondary | ICD-10-CM

## 2019-07-11 DIAGNOSIS — R490 Dysphonia: Secondary | ICD-10-CM | POA: Diagnosis present

## 2019-07-11 DIAGNOSIS — R0602 Shortness of breath: Secondary | ICD-10-CM | POA: Diagnosis not present

## 2019-07-11 DIAGNOSIS — E7800 Pure hypercholesterolemia, unspecified: Secondary | ICD-10-CM | POA: Diagnosis present

## 2019-07-11 DIAGNOSIS — R4781 Slurred speech: Secondary | ICD-10-CM | POA: Diagnosis not present

## 2019-07-11 DIAGNOSIS — G8929 Other chronic pain: Secondary | ICD-10-CM | POA: Diagnosis present

## 2019-07-11 DIAGNOSIS — Z791 Long term (current) use of non-steroidal anti-inflammatories (NSAID): Secondary | ICD-10-CM

## 2019-07-11 DIAGNOSIS — K760 Fatty (change of) liver, not elsewhere classified: Secondary | ICD-10-CM | POA: Diagnosis present

## 2019-07-11 DIAGNOSIS — I10 Essential (primary) hypertension: Secondary | ICD-10-CM | POA: Diagnosis not present

## 2019-07-11 DIAGNOSIS — Z96652 Presence of left artificial knee joint: Secondary | ICD-10-CM | POA: Diagnosis present

## 2019-07-11 DIAGNOSIS — F419 Anxiety disorder, unspecified: Secondary | ICD-10-CM | POA: Diagnosis present

## 2019-07-11 DIAGNOSIS — I6389 Other cerebral infarction: Secondary | ICD-10-CM | POA: Diagnosis not present

## 2019-07-11 DIAGNOSIS — I160 Hypertensive urgency: Secondary | ICD-10-CM | POA: Diagnosis present

## 2019-07-11 LAB — RAPID URINE DRUG SCREEN, HOSP PERFORMED
Amphetamines: NOT DETECTED
Barbiturates: NOT DETECTED
Benzodiazepines: NOT DETECTED
Cocaine: NOT DETECTED
Opiates: NOT DETECTED
Tetrahydrocannabinol: NOT DETECTED

## 2019-07-11 LAB — COMPREHENSIVE METABOLIC PANEL
ALT: 16 U/L (ref 0–44)
AST: 25 U/L (ref 15–41)
Albumin: 4 g/dL (ref 3.5–5.0)
Alkaline Phosphatase: 101 U/L (ref 38–126)
Anion gap: 12 (ref 5–15)
BUN: 13 mg/dL (ref 8–23)
CO2: 24 mmol/L (ref 22–32)
Calcium: 10.1 mg/dL (ref 8.9–10.3)
Chloride: 104 mmol/L (ref 98–111)
Creatinine, Ser: 0.93 mg/dL (ref 0.44–1.00)
GFR calc Af Amer: >60 mL/min (ref >60)
GFR calc non Af Amer: 60 mL/min — ABNORMAL LOW (ref >60)
Glucose, Bld: 102 mg/dL — ABNORMAL HIGH (ref 70–99)
Potassium: 4.4 mmol/L (ref 3.5–5.1)
Sodium: 140 mmol/L (ref 135–145)
Total Bilirubin: 0.8 mg/dL (ref 0.3–1.2)
Total Protein: 6.8 g/dL (ref 6.5–8.1)

## 2019-07-11 LAB — TROPONIN I (HIGH SENSITIVITY)
Troponin I (High Sensitivity): 8 ng/L (ref <18)
Troponin I (High Sensitivity): 9 ng/L (ref <18)

## 2019-07-11 LAB — DIFFERENTIAL
Abs Immature Granulocytes: 0.04 10*3/uL (ref 0.00–0.07)
Basophils Absolute: 0.1 10*3/uL (ref 0.0–0.1)
Basophils Relative: 1 %
Eosinophils Absolute: 0.1 10*3/uL (ref 0.0–0.5)
Eosinophils Relative: 2 %
Immature Granulocytes: 0 %
Lymphocytes Relative: 17 %
Lymphs Abs: 1.5 10*3/uL (ref 0.7–4.0)
Monocytes Absolute: 0.7 10*3/uL (ref 0.1–1.0)
Monocytes Relative: 7 %
Neutro Abs: 6.5 10*3/uL (ref 1.7–7.7)
Neutrophils Relative %: 73 %

## 2019-07-11 LAB — URINALYSIS, ROUTINE W REFLEX MICROSCOPIC
Bilirubin Urine: NEGATIVE
Glucose, UA: NEGATIVE mg/dL
Hgb urine dipstick: NEGATIVE
Ketones, ur: NEGATIVE mg/dL
Leukocytes,Ua: NEGATIVE
Nitrite: NEGATIVE
Protein, ur: NEGATIVE mg/dL
Specific Gravity, Urine: 1.004 — ABNORMAL LOW (ref 1.005–1.030)
pH: 7 (ref 5.0–8.0)

## 2019-07-11 LAB — PROTIME-INR
INR: 0.9 (ref 0.8–1.2)
Prothrombin Time: 12.3 seconds (ref 11.4–15.2)

## 2019-07-11 LAB — HEMOGLOBIN A1C
Hgb A1c MFr Bld: 5.5 % (ref 4.8–5.6)
Mean Plasma Glucose: 111.15 mg/dL

## 2019-07-11 LAB — CBC
HCT: 49.5 % — ABNORMAL HIGH (ref 36.0–46.0)
Hemoglobin: 16.4 g/dL — ABNORMAL HIGH (ref 12.0–15.0)
MCH: 31.8 pg (ref 26.0–34.0)
MCHC: 33.1 g/dL (ref 30.0–36.0)
MCV: 96.1 fL (ref 80.0–100.0)
Platelets: 260 10*3/uL (ref 150–400)
RBC: 5.15 MIL/uL — ABNORMAL HIGH (ref 3.87–5.11)
RDW: 12.5 % (ref 11.5–15.5)
WBC: 8.9 10*3/uL (ref 4.0–10.5)
nRBC: 0 % (ref 0.0–0.2)

## 2019-07-11 LAB — APTT: aPTT: 30 seconds (ref 24–36)

## 2019-07-11 LAB — ETHANOL: Alcohol, Ethyl (B): <10 mg/dL (ref <10)

## 2019-07-11 LAB — SARS CORONAVIRUS 2 (TAT 6-24 HRS): SARS Coronavirus 2: NEGATIVE

## 2019-07-11 MED ORDER — LORAZEPAM 2 MG/ML IJ SOLN
1.0000 mg | Freq: Once | INTRAMUSCULAR | Status: AC
  Administered 2019-07-11: 1 mg via INTRAVENOUS
  Filled 2019-07-11: qty 1

## 2019-07-11 MED ORDER — ENOXAPARIN SODIUM 40 MG/0.4ML ~~LOC~~ SOLN
40.0000 mg | SUBCUTANEOUS | Status: DC
  Administered 2019-07-11 – 2019-07-14 (×4): 40 mg via SUBCUTANEOUS
  Filled 2019-07-11 (×4): qty 0.4

## 2019-07-11 MED ORDER — FLUTICASONE PROPIONATE 50 MCG/ACT NA SUSP
2.0000 | Freq: Every day | NASAL | Status: DC | PRN
  Filled 2019-07-11: qty 16

## 2019-07-11 MED ORDER — VITAMIN D 25 MCG (1000 UNIT) PO TABS
1000.0000 [IU] | ORAL_TABLET | Freq: Every day | ORAL | Status: DC
  Administered 2019-07-11 – 2019-07-15 (×5): 1000 [IU] via ORAL
  Filled 2019-07-11 (×5): qty 1

## 2019-07-11 MED ORDER — ASPIRIN 81 MG PO CHEW
324.0000 mg | CHEWABLE_TABLET | Freq: Every day | ORAL | Status: DC
  Administered 2019-07-12: 324 mg via ORAL
  Filled 2019-07-11 (×2): qty 4

## 2019-07-11 MED ORDER — ATORVASTATIN CALCIUM 80 MG PO TABS
80.0000 mg | ORAL_TABLET | Freq: Every day | ORAL | Status: DC
  Administered 2019-07-11 – 2019-07-14 (×4): 80 mg via ORAL
  Filled 2019-07-11 (×4): qty 1

## 2019-07-11 MED ORDER — ASPIRIN 81 MG PO CHEW
324.0000 mg | CHEWABLE_TABLET | Freq: Once | ORAL | Status: AC
  Administered 2019-07-11: 324 mg via ORAL
  Filled 2019-07-11: qty 4

## 2019-07-11 MED ORDER — LABETALOL HCL 5 MG/ML IV SOLN: 10.0000 mg | INTRAVENOUS | Status: DC | PRN

## 2019-07-11 MED ORDER — HYDROXYZINE HCL 10 MG PO TABS
5.0000 mg | ORAL_TABLET | Freq: Three times a day (TID) | ORAL | Status: DC | PRN
  Administered 2019-07-14 – 2019-07-15 (×3): 10 mg via ORAL
  Filled 2019-07-11 (×5): qty 1

## 2019-07-11 MED ORDER — BIOTIN 10000 MCG PO TABS: 10000.0000 ug | ORAL_TABLET | Freq: Every day | ORAL | Status: DC

## 2019-07-11 MED ORDER — ACETAMINOPHEN 500 MG PO TABS
500.0000 mg | ORAL_TABLET | Freq: Four times a day (QID) | ORAL | Status: DC | PRN
  Administered 2019-07-11 – 2019-07-13 (×3): 500 mg via ORAL
  Filled 2019-07-11 (×4): qty 1

## 2019-07-11 NOTE — Consult Note (Signed)
Neurology Consultation  Reason for Consult: Stroke Referring Physician: Shelly Coss, MD  CC: Stroke  History is obtained from: Patient  HPI: [name] [name] is a 79 y.o. female   with history of obesity, hypertension, hyperlipidemia.  Patient initially came to the hospital secondary to feeling generalized weakness and shortness of breath.  Family also noted that she seemed to have a facial droop and dysarthria.  MRI was obtained showing a stroke thus neurology was consulted.    Patient states that over the last few months she has felt generalized weakness throughout.  She is having sciatic pain secondary to a bulging disc on her left hip along with knee pain on her right knee secondary to needing a full knee replacement.  Last night she went to bed at 11:30 PM feeling normal.  At 7:30 AM this morning she felt as though bilateral legs were very weak and slid from out underneath her which caused her to scoot to the floor.  She could not get herself back up however she did make her way to her phone to which she then called her son and daughter.  They were able to get her up and into a chair.  She states that she did feel very wobbly when standing.  At that point family members noticed she was slurring her words thus brought her to the hospital.    Patient denies smoking, illicit drugs, and does not take aspirin on a daily basis.  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 Arthritis    knees  . Depression   . Fatty liver   . GERD (gastroesophageal reflux disease)   . Hyperlipidemia   . Hypertension   . IBS (irritable bowel syndrome)   . Obesity   . Pneumonia   . Sleep apnea     uses CPAP    Family History  Problem Relation Age of Onset  . Heart attack Mother   . Dementia Father   . Aortic aneurysm Brother   . Colon cancer Neg Hx   . Breast cancer Neg Hx    Social History:   reports that she has never smoked. She has never used smokeless tobacco. She reports current alcohol use. She reports that she does not use drugs.  Medications  Current Facility-Administered Medications:  .  acetaminophen (TYLENOL) tablet 500 mg, 500 mg, Oral, Q6H PRN, Tawanna Solo, Amrit, MD .  aspirin chewable tablet 324 mg, 324 mg, Oral, Once, Shelly Coss, MD .  Derrill Memo ON 07/12/2019] aspirin chewable tablet 324 mg, 324 mg, Oral, Daily, Adhikari, Amrit, MD .  atorvastatin (LIPITOR) tablet 80 mg, 80 mg, Oral, q1800, Adhikari, Amrit, MD .  Biotin TABS 10,000 mcg, 10,000 mcg, Oral, Daily, Adhikari, Amrit, MD .  cholecalciferol (VITAMIN D3) tablet 1,000 Units, 1,000 Units, Oral, Daily, Adhikari, Amrit, MD .  enoxaparin (LOVENOX) injection 40 mg, 40 mg, Subcutaneous, Q24H, Adhikari, Amrit, MD .  fluticasone (FLONASE) 50 MCG/ACT nasal spray 2 spray, 2 spray, Each Nare, Daily PRN, Adhikari, Amrit, MD .  hydrOXYzine (ATARAX/VISTARIL) tablet 5-10 mg, 5-10 mg, Oral, TID PRN, Tawanna Solo, Amrit, MD .  labetalol (NORMODYNE) injection 10 mg, 10 mg, Intravenous, Q2H PRN, Shelly Coss, MD  Current  Outpatient Medications:  .  acetaminophen (TYLENOL) 500 MG tablet, Take 500 mg by mouth every 6 (six) hours as needed for moderate pain. , Disp: , Rfl:  .  atenolol (TENORMIN) 50 MG tablet, Take 1 tablet (50 mg total) by mouth 2 (two) times daily., Disp: 180 tablet, Rfl: 3 .  Biotin 10000 MCG TABS, Take 10,000 mcg by mouth daily., Disp: , Rfl:  .  cholecalciferol (VITAMIN D) 1000 UNITS tablet, Take 1,000 Units by mouth daily. , Disp: , Rfl:  .  clobetasol ointment (TEMOVATE) [id number] %, Apply 1 application topically daily as needed (for itching)., Disp: , Rfl:  .  fluticasone (FLONASE) 50 MCG/ACT nasal  spray, Place 2 sprays into both nostrils daily as needed for allergies or rhinitis., Disp: , Rfl:  .  Glucosamine-Chondroit-Vit C-Mn (GLUCOSAMINE CHONDR 1500 COMPLX PO), Take 1 tablet by mouth 2 (two) times daily., Disp: , Rfl:  .  hydrOXYzine (ATARAX/VISTARIL) 10 MG tablet, Take 0.5-1 tablets (5-10 mg total) by mouth 3 (three) times daily as needed for anxiety (sedation caution)., Disp: 30 tablet, Rfl: 1 .  hyoscyamine (LEVSIN, ANASPAZ) 0.125 MG tablet, Take 1 tablet (0.125 mg total) by mouth 3 (three) times daily as needed., Disp: 100 tablet, Rfl: 1 .  loperamide (IMODIUM) 2 MG capsule, Take 2 mg by mouth as needed for diarrhea or loose stools., Disp: , Rfl:  .  meloxicam (MOBIC) 15 MG tablet, Take 15 mg by mouth daily. With food., Disp: , Rfl:   ROS:     General ROS: negative for - chills, fatigue, fever, night sweats, weight gain or weight loss Psychological ROS: negative for - behavioral disorder, hallucinations, memory difficulties, mood swings or suicidal ideation Ophthalmic ROS: negative for - blurry vision, double vision, eye pain or loss of vision ENT ROS: negative for - epistaxis, nasal discharge, oral lesions, sore throat, tinnitus or vertigo Allergy and Immunology ROS: negative for - hives or itchy/watery eyes Hematological and Lymphatic ROS: negative for - bleeding problems, bruising or swollen lymph nodes Endocrine ROS: negative for - galactorrhea, hair pattern changes, polydipsia/polyuria or temperature intolerance Respiratory ROS: Positive for - shortness of breath Cardiovascular ROS: negative for - chest pain, dyspnea on exertion, edema or irregular heartbeat Gastrointestinal ROS: negative for - abdominal pain, diarrhea, hematemesis, nausea/vomiting or stool incontinence Genito-Urinary ROS: negative for - dysuria, hematuria, incontinence or urinary frequency/urgency Musculoskeletal ROS: Positive for -  generalized muscular weakness Neurological ROS: as noted in  HPI Dermatological ROS: negative for rash and skin lesion changes  Exam: Current vital signs: BP (!) 177/86   Pulse 78   Temp 98.2 F (36.8 C) (Oral)   Resp 15   SpO2 94%  Vital signs in last 24 hours: Temp:  [98.2 F (36.8 C)] 98.2 F (36.8 C) (02/15 1058) Pulse Rate:  [73-81] 78 (02/15 1745) Resp:  [7-22] 15 (02/15 1745) BP: (130-202)/(72-119) 177/86 (02/15 1745) SpO2:  [92 %-98 %] 94 % (02/15 1745)   Constitutional: Appears well-developed and well-nourished.  Psych: Affect appropriate to situation Eyes: No scleral injection HENT: No OP obstrucion Head: Normocephalic.  Cardiovascular: Normal rate and regular rhythm.  Respiratory: Effort normal, non-labored breathing GI: Soft.  No distension. There is no tenderness.  Skin: WDI  Neuro: Mental Status: Patient is awake, alert, oriented to person, place, month, year, and situation.Speech clear with no aphasia or dysarthria Patient is able to give a clear and coherent history. Cranial Nerves: II: Visual Fields are full.  III,IV, VI: EOMI without ptosis or diploplia. Pupils  equal, round and reactive to light V: Facial sensation is symmetric to temperature VII: Left facial droop VIII: hearing is intact to voice X: Palat elevates symmetrically XI: Shoulder shrug is symmetric. XII: tongue is midline without atrophy or fasciculations.  Motor: Tone is normal. Bulk is normal. 5/5 strength was present in the right arm, right leg, left leg with 4/5 strength in the left arm Drift negative aterixis negative Sensory: Sensation is symmetric to light touch and temperature in the arms and legs. DSS intact Deep Tendon Reflexes: 2+ and symmetric in the biceps  Plantars: Toes are downgoing bilaterally.  Cerebellar: FNF and HKS are intact bilaterally    Labs I have reviewed labs in epic and the results pertinent to this consultation are:   CBC    Component Value Date/Time   WBC 8.9 07/11/2019 1100   RBC 5.15 (H)  07/11/2019 1100   HGB 16.4 (H) 07/11/2019 1100   HGB 15.7 05/31/2015 1136   HCT 49.5 (H) 07/11/2019 1100   HCT 45.0 05/31/2015 1136   PLT 260 07/11/2019 1100   PLT 280 05/31/2015 1136   MCV 96.1 07/11/2019 1100   MCV 89 05/31/2015 1136   MCV 91 11/26/2012 1630   MCH 31.8 07/11/2019 1100   MCHC 33.1 07/11/2019 1100   RDW 12.5 07/11/2019 1100   RDW 12.6 05/31/2015 1136   RDW 12.8 11/26/2012 1630   LYMPHSABS 1.5 07/11/2019 1100   LYMPHSABS 2.0 05/31/2015 1136   MONOABS 0.7 07/11/2019 1100   EOSABS 0.1 07/11/2019 1100   EOSABS 0.2 05/31/2015 1136   BASOSABS 0.1 07/11/2019 1100   BASOSABS 0.1 05/31/2015 1136    CMP     Component Value Date/Time   NA 140 07/11/2019 1100   NA 141 05/31/2015 1136   NA 143 11/26/2012 1630   K 4.4 07/11/2019 1100   K 4.3 11/26/2012 1630   CL 104 07/11/2019 1100   CL 110 (H) 11/26/2012 1630   CO2 24 07/11/2019 1100   CO2 27 11/26/2012 1630   GLUCOSE 102 (H) 07/11/2019 1100   GLUCOSE 97 11/26/2012 1630   BUN 13 07/11/2019 1100   BUN 15 05/31/2015 1136   BUN 14 11/26/2012 1630   CREATININE 0.93 07/11/2019 1100   CREATININE 0.88 11/26/2012 1630   CALCIUM 10.1 07/11/2019 1100   CALCIUM 9.1 11/26/2012 1630   PROT 6.8 07/11/2019 1100   PROT 6.5 05/31/2015 1136   PROT 6.7 11/26/2012 1630   ALBUMIN 4.0 07/11/2019 1100   ALBUMIN 4.2 05/31/2015 1136   ALBUMIN 3.7 11/26/2012 1630   AST 25 07/11/2019 1100   AST 33 11/26/2012 1630   ALT 16 07/11/2019 1100   ALT 27 11/26/2012 1630   ALKPHOS 101 07/11/2019 1100   ALKPHOS 96 11/26/2012 1630   BILITOT 0.8 07/11/2019 1100   BILITOT 0.4 05/31/2015 1136   BILITOT 0.3 11/26/2012 1630   GFRNONAA 60 (L) 07/11/2019 1100   GFRNONAA >60 11/26/2012 1630   GFRAA >60 07/11/2019 1100   GFRAA >60 11/26/2012 1630    Lipid Panel     Component Value Date/Time   CHOL 244 (H) 12/10/2018 0947   CHOL 189 05/31/2015 1136   TRIG 226.0 (H) 12/10/2018 0947   HDL 36.70 (L) 12/10/2018 0947   HDL 38 (L) 05/31/2015  1136   CHOLHDL 7 12/10/2018 0947   VLDL 45.2 (H) 12/10/2018 0947   LDLCALC 131 (H) 08/17/2017 0949   LDLCALC 108 (H) 05/31/2015 1136   LDLDIRECT 160.0 12/10/2018 0947   Etta Quill PA-C  [facility] [phone]  M-F  (9:00 am- 5:00 PM)  07/11/2019, 5:54 PM    Imaging I have reviewed the images obtained:  MRI examination of the brain- subcortical infarct on the right.      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   Impression: -Stroke  Recommend  -CTA head neck -Transthoracic Echo, -Start patient on ASA 325mg  daily, plavix 75mg  daily after 300mg  load.  -Start or continue Atorvastatin 80 mg/other high intensity statin -BP goal: permissive HTN upto 220/120 mmHg -HBAIC and Lipid profile -Telemetry monitoring -Frequent neuro checks -NPO until passes stroke swallow screen -PT/OT # please page stroke NP  Or  PA  Or MD from 8am -4 pm  as this patient from this time will be  followed by the stroke.   You can look them up on [email or web address]  Password [email or web address]  Roland Rack, MD [facility] [phone]  If 7pm- 7am, please page neurology on call as listed in [place].

## 2019-07-11 NOTE — ED Notes (Signed)
Pt wheeled to rr and back

## 2019-07-11 NOTE — Progress Notes (Signed)
CPAP held at this time due to pending COVID test.

## 2019-07-11 NOTE — H&P (Signed)
History and Physical    [name] [name] [id number] DOB: February 04, 1941 DOA: 07/11/2019  PCP: Tonia Ghent, MD   Patient coming from: Home    Chief Complaint: Weakness, difficulty in ambulation, slurred speech.  HPI: [name] [name] is a 79 y.o. female with medical history significant of hypertension, hyperlipidemia, chronic back pain,sleep apnea on CPAP at night who presents to the emergency depratment from home with complaint of weakness of bilateral lower extremities, slurred speech.  Since last few days she has noticed  of instability on ambulation and thought that it might related to her back issues.She has been following with orthopedics for her oriented takes and follows with physical therapy. This morning,when she was trying to get out of the bed this morning,she felt weak on her lower extremities, could not stand and she slid onto the floor but she did not fall or injure herself , was unable to get back up.  She called her  family members and they were eventually able to put her back on the bed. She unable to stand on her feet for few minutes, she felt that her face was drooped on the right side and she was thought that her voice was hoarse and was unable to speak.  There was no mention of confusion, change in the vision, headache, nausea, vomiting, fever, chills, chest pain or shortness of breath. When she presented to the emergency department, she was hypertensive.   Patient seen and examined at the bedside in the emergency department.  Currently she is alert and oriented and does not have any focal logical deficits.  She had some very mild slurred speech, no facial droop observed.  She doesn't have  any weakness on any extremities, no tingling or numbness or loss of sensation..  ED Course: Blood pressure was elevated on presentation.  Lab works were fine.  MRI showed acute small vessel infarction involving coronary radiata and caudate .neurology were consulted.  Review of Systems: As  per HPI otherwise 10 point review of systems negative.    Past Medical History:  Diagnosis Date  . Arthritis    knees  . Depression   . Fatty liver   . GERD (gastroesophageal reflux disease)   . Hyperlipidemia   . Hypertension   . IBS (irritable bowel syndrome)   . Obesity   . Pneumonia   . Sleep apnea    uses CPAP    Past Surgical History:  Procedure Laterality Date  . BLADDER SURGERY    . BREAST SURGERY     breast biopsy  . BROW LIFT Bilateral 04/14/2017   Procedure: BLEPHAROPLASTY UPPER EYELID WITH EXCESS SKIN;  Surgeon: Karle Starch, MD;  Location: [place];  Service: Ophthalmology;  Laterality: Bilateral;  . CATARACT EXTRACTION W/PHACO Left 02/05/2016   Procedure: CATARACT EXTRACTION PHACO AND INTRAOCULAR LENS PLACEMENT (IOC);  Surgeon: Birder Robson, MD;  Location: [facility];  Service: Ophthalmology;  Laterality: Left;  [place] 01:10 AP% 22.3 CDE 15.67 Fluid pack lot # [id number]  . CATARACT EXTRACTION W/PHACO Right 02/26/2016   Procedure: CATARACT EXTRACTION PHACO AND INTRAOCULAR LENS PLACEMENT (IOC);  Surgeon: Birder Robson, MD;  Location: [facility];  Service: Ophthalmology;  Laterality: Right;  [place] 57.4 AP% 24.0 CDE 13.75 Fluid Pack lot # [id number]  . CHOLECYSTECTOMY    . COLONOSCOPY WITH PROPOFOL N/A 12/18/2014   Procedure: COLONOSCOPY WITH PROPOFOL;  Surgeon: Manya Silvas, MD;  Location: [facility];  Service: Endoscopy;  Laterality: N/A;  . DEEP NECK LYMPH NODE  BIOPSY / EXCISION    . JOINT REPLACEMENT    . KNEE ARTHROPLASTY Left 06/20/2015   Procedure: COMPUTER ASSISTED TOTAL KNEE ARTHROPLASTY;  Surgeon: Dereck Leep, MD;  Location: [facility];  Service: Orthopedics;  Laterality: Left;  . PTOSIS REPAIR Bilateral 04/14/2017   Procedure: PTOSIS REPAIR RESECT EX;  Surgeon: Karle Starch, MD;  Location: [place];  Service: Ophthalmology;  Laterality: Bilateral;  sleep apnea  . TONSILLECTOMY    . TOTAL HIP ARTHROPLASTY Right 07/21/2018    Procedure: TOTAL HIP ARTHROPLASTY ANTERIOR APPROACH;  Surgeon: Gaynelle Arabian, MD;  Location: [facility];  Service: Orthopedics;  Laterality: Right;     reports that she has never smoked. She has never used smokeless tobacco. She reports current alcohol use. She reports that she does not use drugs.  Allergies  Allergen Reactions  . Hydrocodone Nausea Only    Noted after surgery, may be able to tolerate with food    Family History  Problem Relation Age of Onset  . Heart attack Mother   . Dementia Father   . Aortic aneurysm Brother   . Colon cancer Neg Hx   . Breast cancer Neg Hx      Prior to Admission medications   Medication Sig Start Date End Date Taking? Authorizing Provider  acetaminophen (TYLENOL) 500 MG tablet Take 500 mg by mouth every 6 (six) hours as needed for moderate pain.     [provider]  atenolol (TENORMIN) 50 MG tablet Take 1 tablet (50 mg total) by mouth 2 (two) times daily. 12/16/18   Tonia Ghent, MD  Biotin 10000 MCG TABS Take 10,000 mcg by mouth daily.    [provider]  cholecalciferol (VITAMIN D) 1000 UNITS tablet Take 1,000 Units by mouth daily.     [provider]  clobetasol ointment (TEMOVATE) [id number] % Apply 1 application topically daily as needed (for itching).    [provider]  fluticasone (FLONASE) 50 MCG/ACT nasal spray Place 2 sprays into both nostrils daily as needed for allergies or rhinitis.    [provider]  Glucosamine-Chondroit-Vit C-Mn (GLUCOSAMINE CHONDR 1500 COMPLX PO) Take 1 tablet by mouth 2 (two) times daily.    [provider]  hydrOXYzine (ATARAX/VISTARIL) 10 MG tablet Take 0.5-1 tablets (5-10 mg total) by mouth 3 (three) times daily as needed for anxiety (sedation caution). 03/29/19   Tonia Ghent, MD  hyoscyamine (LEVSIN, ANASPAZ) 0.125 MG tablet Take 1 tablet (0.125 mg total) by mouth 3 (three) times daily as needed. 06/17/18   Tonia Ghent, MD  loperamide (IMODIUM) 2 MG  capsule Take 2 mg by mouth as needed for diarrhea or loose stools.    [provider]  meloxicam (MOBIC) 15 MG tablet Take 15 mg by mouth daily. With food.    [provider]    Physical Exam: Vitals:   07/11/19 1615 07/11/19 1630 07/11/19 1645 07/11/19 1700  BP: (!) 145/80 (!) 149/79 (!) 148/92 (!) 151/119  Pulse: 78  81   Resp: 19 17 (!) 21 16  Temp:      TempSrc:      SpO2: 95%  97%     Constitutional: Comfortable, pleasant elderly female Vitals:   07/11/19 1615 07/11/19 1630 07/11/19 1645 07/11/19 1700  BP: (!) 145/80 (!) 149/79 (!) 148/92 (!) 151/119  Pulse: 78  81   Resp: 19 17 (!) 21 16  Temp:      TempSrc:      SpO2: 95%  97%    Eyes: PERRL, lids and conjunctivae normal ENMT: Mucous membranes are moist.  Neck: normal, supple, no masses, no thyromegaly Respiratory: clear to auscultation bilaterally, no wheezing, no crackles. Normal respiratory effort. No accessory muscle use.  Cardiovascular: Regular rate and rhythm, no murmurs / rubs / gallops. No extremity edema.  Abdomen: no tenderness, no masses palpated. No hepatosplenomegaly. Bowel sounds positive.  Musculoskeletal: no clubbing / cyanosis. No joint deformity upper and lower extremities.  Skin: no rashes, lesions, ulcers. No induration Neurologic: CN 2-12 grossly intact.  Strength 5/5 in all 4.  Very mild dysarthria but speech is mostly fluent .Sensation intact psychiatric: Normal judgment and insight. Alert and oriented x 3. Normal mood.   Foley Catheter:None  Labs on Admission: I have personally reviewed following labs and imaging studies  CBC: Recent Labs  Lab 07/11/19 1100  WBC 8.9  NEUTROABS 6.5  HGB 16.4*  HCT 49.5*  MCV 96.1  PLT 123456   Basic Metabolic Panel: Recent Labs  Lab 07/11/19 1100  NA 140  K 4.4  CL 104  CO2 24  GLUCOSE 102*  BUN 13  CREATININE 0.93  CALCIUM 10.1   GFR: CrCl cannot be calculated (Unknown ideal weight.). Liver Function Tests: Recent Labs    Lab 07/11/19 1100  AST 25  ALT 16  ALKPHOS 101  BILITOT 0.8  PROT 6.8  ALBUMIN 4.0   No results for input(s): LIPASE, AMYLASE in the last 168 hours. No results for input(s): AMMONIA in the last 168 hours. Coagulation Profile: Recent Labs  Lab 07/11/19 1100  INR 0.9   Cardiac Enzymes: No results for input(s): CKTOTAL, CKMB, CKMBINDEX, TROPONINI in the last 168 hours. BNP (last 3 results) No results for input(s): PROBNP in the last 8760 hours. HbA1C: No results for input(s): HGBA1C in the last 72 hours. CBG: No results for input(s): GLUCAP in the last 168 hours. Lipid Profile: No results for input(s): CHOL, HDL, LDLCALC, TRIG, CHOLHDL, LDLDIRECT in the last 72 hours. Thyroid Function Tests: No results for input(s): TSH, T4TOTAL, FREET4, T3FREE, THYROIDAB in the last 72 hours. Anemia Panel: No results for input(s): VITAMINB12, FOLATE, FERRITIN, TIBC, IRON, RETICCTPCT in the last 72 hours. Urine analysis:    Component Value Date/Time   COLORURINE YELLOW 07/11/2019 1100   APPEARANCEUR CLEAR 07/11/2019 1100   APPEARANCEUR Clear 11/26/2012 1630   LABSPEC 1.004 (L) 07/11/2019 1100   LABSPEC 1.009 11/26/2012 1630   PHURINE 7.0 07/11/2019 1100   GLUCOSEU NEGATIVE 07/11/2019 1100   GLUCOSEU Negative 11/26/2012 1630   HGBUR NEGATIVE 07/11/2019 1100   BILIRUBINUR NEGATIVE 07/11/2019 1100   BILIRUBINUR Neg 03/09/2017 1601   BILIRUBINUR Negative 11/26/2012 1630   KETONESUR NEGATIVE 07/11/2019 1100   PROTEINUR NEGATIVE 07/11/2019 1100   UROBILINOGEN 0.2 03/09/2017 1601   NITRITE NEGATIVE 07/11/2019 1100   LEUKOCYTESUR NEGATIVE 07/11/2019 1100   LEUKOCYTESUR Negative 11/26/2012 1630    Radiological Exams on Admission: DG Chest 2 View  Result Date: 07/11/2019 CLINICAL DATA:  Shortness of breath EXAM: CHEST - 2 VIEW COMPARISON:  August 18, 2017 FINDINGS: Lungs are clear. Heart is upper normal in size with pulmonary vascularity normal. No adenopathy. There is degenerative change  in the thoracic spine. IMPRESSION: Lungs clear.  Heart upper normal in size. Electronically Signed   By: Lowella Grip III M.D.   On: 07/11/2019 11:56   CT HEAD WO CONTRAST  Result Date: 07/11/2019 CLINICAL DATA:  Slurred speech and trouble swallowing EXAM: CT HEAD WITHOUT CONTRAST TECHNIQUE: Contiguous axial images  were obtained from the base of the skull through the vertex without intravenous contrast. COMPARISON:  2014 FINDINGS: Brain: There is no acute intracranial hemorrhage, mass-effect, or edema. Gray-white differentiation is preserved. There is no extra-axial fluid collection. Ventricles and sulci are within normal limits in size and configuration. Vascular: No hyperdense vessel or unexpected calcification. Skull: Calvarium is unremarkable. Sinuses/Orbits: No acute finding. Other: None. IMPRESSION: No acute intracranial hemorrhage, mass effect, or evidence of acute infarction. Electronically Signed   By: Macy Mis M.D.   On: 07/11/2019 12:01   MR BRAIN WO CONTRAST  Result Date: 07/11/2019 CLINICAL DATA:  Slurred speech and trouble swallowing EXAM: MRI HEAD WITHOUT CONTRAST TECHNIQUE: Multiplanar, multiecho pulse sequences of the brain and surrounding structures were obtained without intravenous contrast. COMPARISON:  2014 FINDINGS: Motion artifact is present Brain: There are adjacent small foci of restricted diffusion centered within the right corona radiata with some involvement of the caudate body. No evidence of hemorrhage. Patchy and confluent areas of T2 hyperintensity in the supratentorial and pontine white matter are nonspecific but may reflect moderate chronic microvascular ischemic changes. There is no intracranial mass or significant mass effect. Ventricles and sulci are within normal limits in size and configuration. Vascular: Major vessel flow voids at the skull base are preserved. Skull and upper cervical spine: Marrow signal is within normal limits. Sinuses/Orbits: Minor  paranasal sinus mucosal thickening. Bilateral lens replacements. Other: Mastoid air cells are clear.  Sella is unremarkable. IMPRESSION: Acute small vessel infarcts involving the right corona radiata and caudate. Moderate chronic microvascular ischemic changes. Electronically Signed   By: Macy Mis M.D.   On: 07/11/2019 15:56     Assessment/Plan Principal Problem:   Ischemic stroke [facility]) Active Problems:   HYPERCHOLESTEROLEMIA   Essential hypertension   Non hemorrhagic stroke: MRI showed acute small vessel infarcts involving the right corona radiata and caudate.  No history of stroke or TIA in the past.  Currently she does not have any focal neuroogical deficits.  She is alert and oriented. Neurology has been consulted. Stroke work-up initiated.  Will check echocardiogram, carotid Doppler, HbA1C and serum lipid panel.Cud be cryptogenic stroke. EKg showed NSR. Will request for PT/OT/speech evaluation. Start on Lipitor 80 mg daily.  Started on aspirin 325 mg daily. Further recommendation as per neurology.  Hypertension: Hypertensive on presentation.  Allow permissive hypertension.  Continue PRN  meds for systolic blood pressure more than [id number] mmHg or diastolic more than [id number] mmHg .  Gradually normalize blood pressure in 5 to 7 days.  Home antihypertensives on hold.  Hyperlipidemia: Not taking any medication at home.  Will check lipid panel.Started on Lipitor 80 mg daily.  Chronic back pain: Continue supportive care.  Pain management.  Patient follows with physical therapy as an outpatient.  She has history of right hip  and left knee replacement.  Sleep apnea: Continue CPAP at night        Severity of Illness: The appropriate patient status for this patient is INPATIENT.  DVT prophylaxis: Lovenox Code Status: Full Family Communication: Discussed with son on phone. Consults called: Neurology called by ED     Shelly Coss MD [facility]  07/11/2019, 5:33 PM

## 2019-07-11 NOTE — ED Notes (Signed)
Transported to MRI

## 2019-07-11 NOTE — ED Notes (Signed)
Patient transported to CT 

## 2019-07-11 NOTE — Progress Notes (Signed)

## 2019-07-11 NOTE — ED Provider Notes (Signed)
[facility] EMERGENCY DEPARTMENT Provider Note   CSN: [id number] Arrival date & time: 07/11/19  1034     History Chief Complaint  Patient presents with  . Aphasia  . Gait Problem    [name] [name] is a 79 y.o. female.  Patient is a 79 year old female who presents with weakness.  She has a history of herniated disc in her lower back.  She says she has had problems for the last several months with this.  She says that over the last week she has had some increased weakness in both of her lower legs.  No numbness.  She felt a little unstable when she is walking but she feels like it is related to her back issues at her leg weakness.  She denies any loss of bowel or bladder control.  No numbness around her genital area.  No numbness in her upper extremities.  No weakness to her upper extremities.  She had an episode this morning where she was getting out of bed and she slid down to the floor because her socks slid on the vinyl flooring.  She did not fall and she did not injure herself but as she lying on the floor, she was unable to get back up.  She scooted to the bathroom and then was unable to pull herself up and scooted back and called her family who eventually were able to get her back up into the bed.  At that time she noticed that part of her face seemed a little droopy although she says that she normally has a crooked smile and she is not sure if this is normal for her or not.  She has been having a little hoarseness and some congestion in her throat over the last couple of days as well and a little bit of shortness of breath at times.  No associated chest pain.  No headache.  No change in her vision.  No slurring of her speech although she says her voice feels a little bit hoarse.        Past Medical History:  Diagnosis Date  . Arthritis    knees  . Depression   . Fatty liver   . GERD (gastroesophageal reflux disease)   . Hyperlipidemia   . Hypertension   . IBS  (irritable bowel syndrome)   . Obesity   . Pneumonia   . Sleep apnea    uses CPAP    Patient Active Problem List   Diagnosis Date Noted  . Dysfunction of eustachian tube 05/15/2019  . Other social stressor 03/29/2019  . 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 Diarrhea 06/20/2018  . Joint pain 06/20/2018  . 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 Medicare annual wellness visit, subsequent 09/15/2017  . SUI (stress urinary incontinence, female) 08/07/2017  . Back pain 06/16/2016  . Advance care planning 01/07/2016  . Sleep apnea   . 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 Allergic rhinitis 11/24/2014  . Adult BMI 30+ 11/24/2014  . Depression 11/24/2014  . Headache, migraine 05/10/2009  . HERPES SIMPLEX INFECTION 08/14/2006  . HYPERCHOLESTEROLEMIA 08/14/2006  . Anxiety state 08/14/2006  . Essential hypertension 08/14/2006  . HIATAL HERNIA 08/14/2006  . IRRITABLE BOWEL SYNDROME 08/14/2006  . 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 Primary osteoarthritis of right knee 08/14/2006  . PLANTAR FASCIITIS 08/14/2006    Past Surgical History:  Procedure Laterality Date  . BLADDER SURGERY    . BREAST SURGERY  breast biopsy  . BROW LIFT Bilateral 04/14/2017   Procedure: BLEPHAROPLASTY UPPER EYELID WITH EXCESS SKIN;  Surgeon: Karle Starch, MD;  Location: [facility];  Service: Ophthalmology;  Laterality: Bilateral;  . CATARACT EXTRACTION W/PHACO Left 02/05/2016   Procedure: CATARACT EXTRACTION PHACO AND INTRAOCULAR LENS PLACEMENT (IOC);  Surgeon: Birder Robson, MD;  Location: [facility];  Service: Ophthalmology;  Laterality: Left;  [place] 01:10 AP% 22.3 CDE 15.67 Fluid pack lot # [id number]  . CATARACT EXTRACTION W/PHACO Right 02/26/2016   Procedure: CATARACT EXTRACTION PHACO AND INTRAOCULAR LENS PLACEMENT (IOC);  Surgeon: Birder Robson, MD;  Location: [facility];  Service: Ophthalmology;  Laterality: Right;  [place] 57.4 AP% 24.0 CDE  13.75 Fluid Pack lot # [id number]  . CHOLECYSTECTOMY    . COLONOSCOPY WITH PROPOFOL N/A 12/18/2014   Procedure: COLONOSCOPY WITH PROPOFOL;  Surgeon: Manya Silvas, MD;  Location: [facility];  Service: Endoscopy;  Laterality: N/A;  . DEEP NECK LYMPH NODE BIOPSY / EXCISION    . JOINT REPLACEMENT    . KNEE ARTHROPLASTY Left 06/20/2015   Procedure: COMPUTER ASSISTED TOTAL KNEE ARTHROPLASTY;  Surgeon: Dereck Leep, MD;  Location: [facility];  Service: Orthopedics;  Laterality: Left;  . PTOSIS REPAIR Bilateral 04/14/2017   Procedure: PTOSIS REPAIR RESECT EX;  Surgeon: Karle Starch, MD;  Location: [facility];  Service: Ophthalmology;  Laterality: Bilateral;  sleep apnea  . TONSILLECTOMY    . TOTAL HIP ARTHROPLASTY Right 07/21/2018   Procedure: TOTAL HIP ARTHROPLASTY ANTERIOR APPROACH;  Surgeon: Gaynelle Arabian, MD;  Location: [facility];  Service: Orthopedics;  Laterality: Right;     OB History   No obstetric history on file.     Family History  Problem Relation Age of Onset  . Heart attack Mother   . Dementia Father   . Aortic aneurysm Brother   . Colon cancer Neg Hx   . Breast cancer Neg Hx     Social History   Tobacco Use  . Smoking status: Never Smoker  . Smokeless tobacco: Never Used  Substance Use Topics  . Alcohol use: Yes    Comment: ocassional glass of wine 2-3 times a year  . Drug use: No    Home Medications Prior to Admission medications   Medication Sig Start Date End Date Taking? Authorizing Provider  acetaminophen (TYLENOL) 500 MG tablet Take 500 mg by mouth every 6 (six) hours as needed for moderate pain.     [provider]  atenolol (TENORMIN) 50 MG tablet Take 1 tablet (50 mg total) by mouth 2 (two) times daily. 12/16/18   Tonia Ghent, MD  Biotin 10000 MCG TABS Take 10,000 mcg by mouth daily.    [provider]  cholecalciferol (VITAMIN D) 1000 UNITS tablet Take 1,000 Units by mouth daily.     [provider]  clobetasol  ointment (TEMOVATE) [id number] % Apply 1 application topically daily as needed (for itching).    [provider]  fluticasone (FLONASE) 50 MCG/ACT nasal spray Place 2 sprays into both nostrils daily as needed for allergies or rhinitis.    [provider]  Glucosamine-Chondroit-Vit C-Mn (GLUCOSAMINE CHONDR 1500 COMPLX PO) Take 1 tablet by mouth 2 (two) times daily.    [provider]  hydrOXYzine (ATARAX/VISTARIL) 10 MG tablet Take 0.5-1 tablets (5-10 mg total) by mouth 3 (three) times daily as needed for anxiety (sedation caution). 03/29/19   Tonia Ghent, MD  hyoscyamine (LEVSIN, ANASPAZ) 0.125 MG tablet Take 1 tablet (0.125 mg  total) by mouth 3 (three) times daily as needed. 06/17/18   Tonia Ghent, MD  loperamide (IMODIUM) 2 MG capsule Take 2 mg by mouth as needed for diarrhea or loose stools.    [provider]  meloxicam (MOBIC) 15 MG tablet Take 15 mg by mouth daily. With food.    [provider]    Allergies    Hydrocodone  Review of Systems   Review of Systems  Constitutional: Negative for chills, diaphoresis, fatigue and fever.  HENT: Positive for voice change (mild hoarseness). Negative for congestion, rhinorrhea and sneezing.   Eyes: Negative.   Respiratory: Positive for shortness of breath. Negative for cough and chest tightness.   Cardiovascular: Negative for chest pain and leg swelling.  Gastrointestinal: Negative for abdominal pain, blood in stool, diarrhea, nausea and vomiting.  Genitourinary: Negative for difficulty urinating, flank pain, frequency and hematuria.  Musculoskeletal: Positive for back pain. Negative for arthralgias.  Skin: Negative for rash.  Neurological: Positive for dizziness, weakness and light-headedness. Negative for speech difficulty, numbness and headaches.    Physical Exam Updated Vital Signs BP 130/72   Pulse 73   Temp 98.2 F (36.8 C) (Oral)   Resp 17   SpO2 93%   Physical Exam Constitutional:        Appearance: She is well-developed.  HENT:     Head: Normocephalic and atraumatic.  Eyes:     Pupils: Pupils are equal, round, and reactive to light.  Cardiovascular:     Rate and Rhythm: Normal rate and regular rhythm.     Heart sounds: Normal heart sounds.  Pulmonary:     Effort: Pulmonary effort is normal. No respiratory distress.     Breath sounds: Normal breath sounds. No wheezing or rales.  Chest:     Chest wall: No tenderness.  Abdominal:     General: Bowel sounds are normal.     Palpations: Abdomen is soft.     Tenderness: There is no abdominal tenderness. There is no guarding or rebound.  Musculoskeletal:        General: Normal range of motion.     Cervical back: Normal range of motion and neck supple.  Lymphadenopathy:     Cervical: No cervical adenopathy.  Skin:    General: Skin is warm and dry.     Findings: No rash.  Neurological:     Mental Status: She is alert and oriented to person, place, and time.     Comments: Motor 5/5 all extremities Sensation grossly intact to LT all extremities Finger to Nose intact, no pronator drift CN II-XII grossly intact Visual fields full to confrontation      ED Results / Procedures / Treatments   Labs (all labs ordered are listed, but only abnormal results are displayed) Labs Reviewed  CBC - Abnormal; Notable for the following components:      Result Value   RBC 5.15 (*)    Hemoglobin 16.4 (*)    HCT 49.5 (*)    All other components within normal limits  COMPREHENSIVE METABOLIC PANEL - Abnormal; Notable for the following components:   Glucose, Bld 102 (*)    GFR calc non Af Amer 60 (*)    All other components within normal limits  URINALYSIS, ROUTINE W REFLEX MICROSCOPIC - Abnormal; Notable for the following components:   Specific Gravity, Urine 1.004 (*)    All other components within normal limits  ETHANOL  PROTIME-INR  APTT  DIFFERENTIAL  RAPID URINE DRUG SCREEN, HOSP  PERFORMED  I-STAT CHEM 8, ED   TROPONIN I (HIGH SENSITIVITY)  TROPONIN I (HIGH SENSITIVITY)    EKG EKG Interpretation  Date/Time:  Monday July 11 2019 11:03:18 EST Ventricular Rate:  81 PR Interval:    QRS Duration: 104 QT Interval:  385 QTC Calculation: 447 R Axis:   29 Text Interpretation: Sinus rhythm Minimal ST depression, inferior leads since last tracing no significant change Confirmed by Malvin Johns [phone]) on 07/11/2019 11:53:26 AM   Radiology DG Chest 2 View  Result Date: 07/11/2019 CLINICAL DATA:  Shortness of breath EXAM: CHEST - 2 VIEW COMPARISON:  August 18, 2017 FINDINGS: Lungs are clear. Heart is upper normal in size with pulmonary vascularity normal. No adenopathy. There is degenerative change in the thoracic spine. IMPRESSION: Lungs clear.  Heart upper normal in size. Electronically Signed   By: Lowella Grip III M.D.   On: 07/11/2019 11:56   CT HEAD WO CONTRAST  Result Date: 07/11/2019 CLINICAL DATA:  Slurred speech and trouble swallowing EXAM: CT HEAD WITHOUT CONTRAST TECHNIQUE: Contiguous axial images were obtained from the base of the skull through the vertex without intravenous contrast. COMPARISON:  2014 FINDINGS: Brain: There is no acute intracranial hemorrhage, mass-effect, or edema. Gray-white differentiation is preserved. There is no extra-axial fluid collection. Ventricles and sulci are within normal limits in size and configuration. Vascular: No hyperdense vessel or unexpected calcification. Skull: Calvarium is unremarkable. Sinuses/Orbits: No acute finding. Other: None. IMPRESSION: No acute intracranial hemorrhage, mass effect, or evidence of acute infarction. Electronically Signed   By: Macy Mis M.D.   On: 07/11/2019 12:01    Procedures Procedures (including critical care time)  Medications Ordered in ED Medications  LORazepam (ATIVAN) injection 1 mg (1 mg Intravenous Given 07/11/19 1457)    ED Course  I have reviewed the triage vital signs and the nursing  notes.  Pertinent labs & imaging results that were available during my care of the patient were reviewed by me and considered in my medical decision making (see chart for details).  Clinical Course as of Jul 10 1554  Mon Jul 11, 2019  1528 Pt signed out to me by Dr. Tamera Punt.  Briefly this is a 79 yo female presenting to ED with leg weakness and possible facial droop (unclear if this is different from baseline, reportedly has facial unevenness), questionable voice change vs. Hoarseness.  No signs of cauda equina per initial EDP exam.  Patient feeling better.  Plan for MRI, ambulate patient, and discharge if negative for acute changes   [MT]    Clinical Course User Index [MT] Langston Masker, Carola Rhine, MD   MDM Rules/Calculators/A&P                      Patient is a 79 year old female who presents after sliding out of her bed this morning and being unable to get up.  She said that her legs are weaker but they have been weaker for about the last 3 weeks or so.  She said she had an MRI on February 3 and is scheduled to have an epidural injection by [name] next week.  He does not have any sensation deficits.  No suggestions of cauda equina.  She is able to lift both legs off the bed with good strength.  Given that she had a recent MRI in February and no significantly worsening symptoms, I do not feel that that needs to be repeated at this point.  However she vaguely gave some  symptoms of some possible facial drooping although she was not completely clear about this associated with some difficulty with her speech although she describes as more of a hoarseness rather than slurred speech or difficulty getting her words out.  However given the symptoms, I will get an MRI of her brain to rule out small stroke.  If this is normal and patient is able to ambulate, I feel that she can likely be discharged home.  She feels much better than when she got here.  She does not have any focal neurologic deficits.  Her chest  x-ray is clear without evidence of pneumonia.  She does not have other Covid symptoms.  Her other labs are nonconcerning.  Dr. Langston Masker to take over pending MR and ambulation. Final Clinical Impression(s) / ED Diagnoses Final diagnoses:  None    Rx / DC Orders ED Discharge Orders    None       Malvin Johns, MD 07/11/19 1559

## 2019-07-11 NOTE — ED Notes (Signed)
Pt is updating family

## 2019-07-11 NOTE — ED Triage Notes (Signed)
Pt arrives to ED with c/c slurred speech and trouble swallowing. Pt also reports over the last 1 week she has noticed a unbalance while walking but this seems worse this morning when she went to stand out of bed and slid down into floor. Pt reports waking up at 4am and noticing a difference in the left side of her face.

## 2019-07-12 ENCOUNTER — Inpatient Hospital Stay (HOSPITAL_COMMUNITY): Payer: Medicare HMO

## 2019-07-12 LAB — LIPID PANEL
Cholesterol: 222 mg/dL — ABNORMAL HIGH (ref 0–200)
HDL: 33 mg/dL — ABNORMAL LOW (ref >40)
LDL Cholesterol: 130 mg/dL — ABNORMAL HIGH (ref 0–99)
Total CHOL/HDL Ratio: 6.7 RATIO
Triglycerides: 295 mg/dL — ABNORMAL HIGH (ref <150)
VLDL: 59 mg/dL — ABNORMAL HIGH (ref 0–40)

## 2019-07-12 LAB — CBC
HCT: 44.6 % (ref 36.0–46.0)
Hemoglobin: 14.8 g/dL (ref 12.0–15.0)
MCH: 31.3 pg (ref 26.0–34.0)
MCHC: 33.2 g/dL (ref 30.0–36.0)
MCV: 94.3 fL (ref 80.0–100.0)
Platelets: 235 10*3/uL (ref 150–400)
RBC: 4.73 MIL/uL (ref 3.87–5.11)
RDW: 12.4 % (ref 11.5–15.5)
WBC: 8.1 10*3/uL (ref 4.0–10.5)
nRBC: 0 % (ref 0.0–0.2)

## 2019-07-12 LAB — BASIC METABOLIC PANEL
Anion gap: 10 (ref 5–15)
BUN: 12 mg/dL (ref 8–23)
CO2: 25 mmol/L (ref 22–32)
Calcium: 9.5 mg/dL (ref 8.9–10.3)
Chloride: 103 mmol/L (ref 98–111)
Creatinine, Ser: 0.93 mg/dL (ref 0.44–1.00)
GFR calc Af Amer: >60 mL/min (ref >60)
GFR calc non Af Amer: 60 mL/min — ABNORMAL LOW (ref >60)
Glucose, Bld: 101 mg/dL — ABNORMAL HIGH (ref 70–99)
Potassium: 3.9 mmol/L (ref 3.5–5.1)
Sodium: 138 mmol/L (ref 135–145)

## 2019-07-12 MED ORDER — ALUM & MAG HYDROXIDE-SIMETH 200-200-20 MG/5ML PO SUSP
15.0000 mL | Freq: Four times a day (QID) | ORAL | Status: DC | PRN
  Administered 2019-07-12 – 2019-07-14 (×3): 15 mL via ORAL
  Filled 2019-07-12 (×3): qty 30

## 2019-07-12 MED ORDER — STUDY - PACIFIC (STROKE) - BAY 2433334 (BLUE BOTTLE) 5, 15, 25MG OR PLACEBO TABLET (PI-SETHI)
1.0000 | ORAL_TABLET | Freq: Every day | ORAL | Status: DC
  Administered 2019-07-12 – 2019-07-15 (×4): 1 via ORAL
  Filled 2019-07-12 (×4): qty 1

## 2019-07-12 MED ORDER — ONDANSETRON HCL 4 MG/2ML IJ SOLN
4.0000 mg | Freq: Four times a day (QID) | INTRAMUSCULAR | Status: DC | PRN
  Administered 2019-07-12 – 2019-07-13 (×3): 4 mg via INTRAVENOUS
  Filled 2019-07-12 (×3): qty 2

## 2019-07-12 MED ORDER — CLOPIDOGREL BISULFATE 75 MG PO TABS
75.0000 mg | ORAL_TABLET | Freq: Every day | ORAL | Status: DC
  Administered 2019-07-12 – 2019-07-15 (×4): 75 mg via ORAL
  Filled 2019-07-12 (×4): qty 1

## 2019-07-12 MED ORDER — STUDY - PACIFIC (STROKE) - BAY 2433334 (GREEN BOTTLE) 5, 15, 25MG OR PLACEBO TABLET (PI-SETHI)
1.0000 | ORAL_TABLET | Freq: Every day | ORAL | Status: DC
  Administered 2019-07-12 – 2019-07-15 (×4): 1 via ORAL
  Filled 2019-07-12 (×4): qty 1

## 2019-07-12 MED ORDER — IOHEXOL 350 MG/ML SOLN
100.0000 mL | Freq: Once | INTRAVENOUS | Status: AC | PRN
  Administered 2019-07-12: 100 mL via INTRAVENOUS

## 2019-07-12 MED ORDER — ASPIRIN 81 MG PO CHEW
81.0000 mg | CHEWABLE_TABLET | Freq: Every day | ORAL | Status: DC
  Administered 2019-07-13 – 2019-07-15 (×3): 81 mg via ORAL
  Filled 2019-07-12 (×3): qty 1

## 2019-07-12 NOTE — TOC Initial Note (Signed)
Transition of Care [facility]) - Initial/Assessment Note    Patient Details  Name: [name] [name] MRN: [id number] Date of Birth: 09-25-1940  Transition of Care [facility]) CM/SW Contact:    Carles Collet, RN Phone Number: 07/12/2019, 11:10 AM  Clinical Narrative:              Damaris Schooner w patient and daughter at bedside. They are interested in her going back home at [place]. She lives by herself but her daughter confirmed she would have supervision. She has used [facility] in the past and would like to use them again.  She has a RW, 3/1, shower seat at home already. Will need HH orders and face to face.  [facility] liaison notified of referral.      Expected Discharge Plan: [place] Barriers to Discharge: Continued Medical Work up   Patient Goals and CMS Choice Patient states their goals for this hospitalization and ongoing recovery are:: to go home      Expected Discharge Plan and Services Expected Discharge Plan: [place] Arranged: PT, OT [facility] Agency: [facility] Date [place]: 07/12/19 Time HH Agency Contacted: 1107 Representative spoke with at [place]: tiffany  Prior Living Arrangements/Services                       Activities of Daily Living Home Assistive Devices/Equipment: CPAP ADL Screening (condition at time of admission) Patient's cognitive ability adequate to safely complete daily activities?: Yes Is the patient deaf or have difficulty hearing?: No Does the patient have difficulty seeing, even when wearing glasses/contacts?: No Does the patient have difficulty concentrating, remembering, or making decisions?: No Patient able to express need for assistance with ADLs?: No Does the patient have difficulty dressing or bathing?: No Independently performs ADLs?: Yes (appropriate for developmental age) Does the patient have difficulty walking or climbing stairs?:  Yes Weakness of Legs: Both Weakness of Arms/Hands: None  Permission Sought/Granted                  Emotional Assessment              Admission diagnosis:  Acute CVA (cerebrovascular accident) [facility]) [I63.9] Patient Active Problem List   Diagnosis Date Noted  . Ischemic stroke ([place]) 07/11/2019  . Acute CVA (cerebrovascular accident) ([place]) 07/11/2019  . Dysfunction of eustachian tube 05/15/2019  . Other social stressor 03/29/2019  . 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 Diarrhea 06/20/2018  . Joint pain 06/20/2018  . 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 Medicare annual wellness visit, subsequent 09/15/2017  . SUI (stress urinary incontinence, female) 08/07/2017  . Back pain 06/16/2016  . Advance care planning 01/07/2016  . Sleep apnea   . 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 Allergic rhinitis 11/24/2014  . Adult BMI 30+ 11/24/2014  . Depression 11/24/2014  . Headache, migraine 05/10/2009  . HERPES SIMPLEX INFECTION 08/14/2006  . HYPERCHOLESTEROLEMIA 08/14/2006  . Anxiety state 08/14/2006  . Essential hypertension 08/14/2006  . HIATAL HERNIA 08/14/2006  . IRRITABLE BOWEL SYNDROME 08/14/2006  . 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 Primary osteoarthritis of right knee 08/14/2006  . PLANTAR FASCIITIS 08/14/2006   PCP:  Tonia Ghent, MD  Pharmacy:   [place] [place] [facility] [address] [place] [address] Phone: [phone] Fax: [phone]     Social Determinants of Health (SDOH) Interventions    Readmission Risk Interventions No flowsheet data found.

## 2019-07-12 NOTE — Consult Note (Signed)
Physical Medicine and Rehabilitation Consult   Reason for Consult: stroke with functional deficits Referring Physician: Dr. Leonie Man.     HPI: [name] [name] is a 79 y.o. female with history of HTN, fatty liver, OSA, IBS, OA- right knee--needs replacement, LBP with LLE sciatica; who was admitted on 07/11/2019 with bilateral lower extremity weakness and slurred speech.  History taken from chart review, daughter, and patient.  MRI brain showed right corona radiata and caudate infarcts with moderate chronic microvascular changes.  Stroke felt to be due to small vessel disease and recommendations for DAPT followed by ASA alone. Facial droop has improved and mild dysarthria persistent--on regular diet per ST.  MBS ordered for today due to reports of premorbid globus sensation with question of pharyngeal/esophageal dysphagia.  Therapy evaluations done revealing significant balance deficits and CIR recommended for follow up therapy.   Review of Systems  Constitutional: Negative for chills and fever.  HENT: Negative for hearing loss and tinnitus.   Eyes: Negative for blurred vision and double vision.  Respiratory: Negative for cough and shortness of breath.   Cardiovascular: Negative for chest pain.  Gastrointestinal: Positive for constipation (takes Magnesium tabs few times a week) and heartburn.  Genitourinary: Positive for frequency. Negative for dysuria.       Has pelvic floor weakness. Gets up couple of times a night  Musculoskeletal: Positive for back pain (HNP with radiculopathy LLE--needs ESI) and joint pain (needs R-TKR).  Skin: Negative for rash.  Neurological: Positive for speech change and focal weakness. Negative for sensory change.  Psychiatric/Behavioral: The patient is nervous/anxious.      Past Medical History:  Diagnosis Date  . Arthritis    knees  . Depression   . Fatty liver   . GERD (gastroesophageal reflux disease)   . Hyperlipidemia   . Hypertension   . IBS  (irritable bowel syndrome)   . Obesity   . Pneumonia   . Sleep apnea    uses CPAP    Past Surgical History:  Procedure Laterality Date  . BLADDER SURGERY    . BREAST SURGERY     breast biopsy  . BROW LIFT Bilateral 04/14/2017   Procedure: BLEPHAROPLASTY UPPER EYELID WITH EXCESS SKIN;  Surgeon: Karle Starch, MD;  Location: [place];  Service: Ophthalmology;  Laterality: Bilateral;  . CATARACT EXTRACTION W/PHACO Left 02/05/2016   Procedure: CATARACT EXTRACTION PHACO AND INTRAOCULAR LENS PLACEMENT (IOC);  Surgeon: Birder Robson, MD;  Location: [facility];  Service: Ophthalmology;  Laterality: Left;  [place] 01:10 AP% 22.3 CDE 15.67 Fluid pack lot # [id number]  . CATARACT EXTRACTION W/PHACO Right 02/26/2016   Procedure: CATARACT EXTRACTION PHACO AND INTRAOCULAR LENS PLACEMENT (IOC);  Surgeon: Birder Robson, MD;  Location: [facility];  Service: Ophthalmology;  Laterality: Right;  [place] 57.4 AP% 24.0 CDE 13.75 Fluid Pack lot # [id number]  . CHOLECYSTECTOMY    . COLONOSCOPY WITH PROPOFOL N/A 12/18/2014   Procedure: COLONOSCOPY WITH PROPOFOL;  Surgeon: Manya Silvas, MD;  Location: [facility];  Service: Endoscopy;  Laterality: N/A;  . DEEP NECK LYMPH NODE BIOPSY / EXCISION    . JOINT REPLACEMENT    . KNEE ARTHROPLASTY Left 06/20/2015   Procedure: COMPUTER ASSISTED TOTAL KNEE ARTHROPLASTY;  Surgeon: Dereck Leep, MD;  Location: [facility];  Service: Orthopedics;  Laterality: Left;  . PTOSIS REPAIR Bilateral 04/14/2017   Procedure: PTOSIS REPAIR RESECT EX;  Surgeon: Karle Starch, MD;  Location: [place];  Service: Ophthalmology;  Laterality:  Bilateral;  sleep apnea  . TONSILLECTOMY    . TOTAL HIP ARTHROPLASTY Right 07/21/2018   Procedure: TOTAL HIP ARTHROPLASTY ANTERIOR APPROACH;  Surgeon: Gaynelle Arabian, MD;  Location: [facility];  Service: Orthopedics;  Laterality: Right;    Family History  Problem Relation Age of Onset  . Heart attack Mother   . Dementia Father   .  Aortic aneurysm Brother   . Colon cancer Neg Hx   . Breast cancer Neg Hx     Social History:  Lives alone and independent PTA. Retired Agricultural consultant for [employer]. Has been going to outpatient rehab for sciatica. She  reports that she has never smoked. She has never used smokeless tobacco. She reports current alcohol use--drinks a glass of wine a couple of times a month/special occasions. She reports that she does not use drugs.    Allergies  Allergen Reactions  . Hydrocodone Nausea Only    Noted after surgery, may be able to tolerate with food    Medications Prior to Admission  Medication Sig Dispense Refill  . acetaminophen (TYLENOL) 500 MG tablet Take 500 mg by mouth every 6 (six) hours as needed for moderate pain.     [name] Kitchen atenolol (TENORMIN) 50 MG tablet Take 1 tablet (50 mg total) by mouth 2 (two) times daily. 180 tablet 3  . Biotin 10000 MCG TABS Take 10,000 mcg by mouth daily.    . cetirizine (ZYRTEC) 10 MG tablet Take 10 mg by mouth daily as needed for allergies.    . cholecalciferol (VITAMIN D) 1000 UNITS tablet Take 1,000 Units by mouth daily.     . clobetasol ointment (TEMOVATE) [id number] % Apply 1 application topically daily as needed (for itching).    . fluticasone (FLONASE) 50 MCG/ACT nasal spray Place 2 sprays into both nostrils daily as needed for allergies or rhinitis.    . Glucosamine-Chondroit-Vit C-Mn (GLUCOSAMINE CHONDR 1500 COMPLX PO) Take 1 tablet by mouth 2 (two) times daily.    . hydrOXYzine (ATARAX/VISTARIL) 10 MG tablet Take 0.5-1 tablets (5-10 mg total) by mouth 3 (three) times daily as needed for anxiety (sedation caution). 30 tablet 1  . hyoscyamine (LEVSIN, ANASPAZ) 0.125 MG tablet Take 1 tablet (0.125 mg total) by mouth 3 (three) times daily as needed. (Patient taking differently: Take 0.125 mg by mouth 3 (three) times daily as needed for bladder spasms. ) 100 tablet 1  . methocarbamol (ROBAXIN) 500 MG tablet Take 500 mg by mouth daily as needed.    . Pumpkin  Seed-Soy Germ (AZO BLADDER CONTROL/GO-LESS PO) Take 1 tablet by mouth daily as needed (bladder control).      Home: Home Living Family/patient expects to be discharged to:: Private residence Living Arrangements: Alone Available Help at Discharge: Family Type of Home: Other(Comment)(condo) Home Access: Level entry [place]: One level Bathroom Shower/Tub: Multimedia programmer: [place]: Environmental consultant - 2 wheels, Bedside commode, Wheelchair - manual  Functional History: Prior Function Level of Independence: Independent Comments: multiple LE orthopedic issues plus sciatica with prior gait impairments, but did not use an AD; was going to OPPT for sciatica Functional Status:  Mobility: Bed Mobility Overal bed mobility: Needs Assistance Bed Mobility: Supine to Sit, Sit to Supine Supine to sit: HOB elevated, Min assist Sit to supine: Min assist General bed mobility comments: HOB remained elevated due to persistent nausea; incr effort with removing covers and coming to sit at EOB; nearly needed assist; +use of rail Transfers Overall transfer level: Needs assistance Equipment used: None  Transfers: Sit to/from Stand Sit to Stand: Min assist General transfer comment: steadying assist upon standing  Ambulation/Gait Ambulation/Gait assistance: Min assist Gait Distance (Feet): 30 Feet Assistive device: None Gait Pattern/deviations: Step-through pattern, Decreased step length - right, Decreased weight shift to right, Drifts right/left General Gait Details: patient with increasing lean to her left after 10 ft; drift to left and reach for outside support Gait velocity: decr    ADL: ADL Overall ADL's : Needs assistance/impaired Eating/Feeding: Modified independent, Sitting Grooming: Minimal assistance, Standing, Wash/dry hands Upper Body Bathing: Min guard, Sitting Lower Body Bathing: Minimal assistance, Sit to/from stand Upper Body Dressing : Min guard, Set up,  Sitting Lower Body Dressing: Minimal assistance, Sit to/from stand Toilet Transfer: Minimal assistance, Ambulation, Regular Toilet Toilet Transfer Details (indicate cue type and reason): use of HHA Toileting- Clothing Manipulation and Hygiene: Minimal assistance, Sit to/from stand Toileting - Clothing Manipulation Details (indicate cue type and reason): assist for balance while pt completes clothing management (gown and underwear) Functional mobility during ADLs: Minimal assistance(HHA) General ADL Comments: pt with reports of headache/nausea this session, completed toileting and standing grooming ADL during session  Cognition: Cognition Overall Cognitive Status: Within Functional Limits for tasks assessed Orientation Level: Oriented X4 Cognition Arousal/Alertness: Suspect due to medications(groggy; ? due to nausea meds) Behavior During Therapy: WFL for tasks assessed/performed Overall Cognitive Status: Within Functional Limits for tasks assessed General Comments: for basic tasks, will continue to assess, A&Ox4   Blood pressure (!) 156/104, pulse 94, temperature 98.4 F (36.9 C), resp. rate 20, height 5' (1.524 m), weight 83 kg, SpO2 94 %. Physical Exam  Nursing note and vitals reviewed. Constitutional: She is oriented to person, place, and time. She appears well-developed.  Morbidly obese  HENT:  Head: Normocephalic and atraumatic.  Eyes: EOM are normal. Right eye exhibits no discharge. Left eye exhibits no discharge.  Neck: No tracheal deviation present. No thyromegaly present.  Respiratory: Effort normal. No stridor. No respiratory distress.  GI: Soft.  Musculoskeletal:     Comments: No edema or tenderness in extremities  Neurological: She is alert and oriented to person, place, and time.  Motor: RUE/RLE: 5/5 proximal distal LUE: 4+/5 proximal distal LLE: 4-4+/5 proximal distal (some pain addition proximally), weaker at baseline Left facial weakness  Skin: Skin is warm and  dry.  Psychiatric:  Slightly slurred speech Some confusion    No results found for this or any previous visit (from the past 24 hour(s)). CT ANGIO HEAD W OR WO CONTRAST  Result Date: 07/12/2019 CLINICAL DATA:  79 year old female with acute lacunar 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 EXAM: CT ANGIOGRAPHY HEAD AND NECK TECHNIQUE: Multidetector CT imaging of the head and neck was performed using the standard protocol during bolus administration of intravenous contrast. Multiplanar CT image reconstructions and MIPs were obtained to evaluate the vascular anatomy. Carotid stenosis measurements (when applicable) are obtained utilizing NASCET criteria, using the distal internal carotid diameter as the denominator. CONTRAST:  136mL OMNIPAQUE IOHEXOL 350 MG/ML SOLN COMPARISON:  Brain MRI and head CT 07/11/2019. FINDINGS: CT HEAD Brain: Subtle hypodensity in the right corona radiata corresponding to the diffusion abnormality with no intracranial hemorrhage or mass effect. Stable gray-white matter differentiation elsewhere. No ventriculomegaly. Calvarium and skull base: No acute osseous abnormality identified. Paranasal sinuses: Visualized paranasal sinuses and mastoids are stable and well pneumatized. Orbits: No acute orbit or scalp soft tissue finding. Small dystrophic calcifications in the right parotid gland. CTA NECK Skeleton: Cervical spine  degeneration including mild C3-C4 spondylolisthesis. No acute osseous abnormality identified. Upper chest: Negative. Other neck: No acute soft tissue findings in the neck. Right parotid gland sialolithiasis again noted. Aortic arch: 3 vessel arch configuration with minimal arch atherosclerosis. Right carotid system: Mildly tortuous brachiocephalic artery and proximal right CCA without plaque or stenosis. Negative right carotid bifurcation. Tortuous cervical right ICA without plaque or stenosis. Left carotid system: Tortuous without plaque or  stenosis. Vertebral arteries: Minimal plaque in the proximal right subclavian artery without stenosis. Normal right vertebral artery origin. Tortuous right V1 and V2 segments. The right vertebral is patent to the skull base without plaque or stenosis. Mild plaque in the proximal left subclavian artery without stenosis. Tortuosity of the vessel with a kinked appearance at the thoracic inlet. Normal left vertebral artery origin. Tortuous left V1 and V2. Patent left vertebral to the skull base without plaque or stenosis. CTA HEAD Posterior circulation: Mild bilateral V4 segment plaque without stenosis. Patent PICA origins and vertebrobasilar junction. Patent basilar artery which is mildly tapered to the basilar tip (series 15, image 23). SCA and PCA origins remain patent. Posterior communicating arteries are diminutive or absent. Moderate irregularity and stenosis at the left P1/P2 junction on series 14, image 21. Contralateral mild right P2 and P3 stenosis. Anterior circulation: Both ICA siphons are patent. On the left there is moderate calcified plaque and moderate to severe stenosis at the anterior genu (series 13, image 116). Normal left ophthalmic artery origin. On the right similar bulky calcified plaque but no significant right siphon stenosis. Normal right ophthalmic artery origin. Patent carotid termini. Normal MCA and ACA origins. Dominant right A1. Anterior communicating artery and proximal ACA branches are normal. There is bilateral distal A2 moderate stenosis as seen on series 16, image 23 and series 15, image 17. Left MCA M1 segment and bifurcation are patent without stenosis. Left MCA branches are within normal limits. Right MCA M1 segment and bifurcation are patent without stenosis. Right MCA branches are within normal limits. Venous sinuses: Patent. Anatomic variants: Dominant right ACA A1. Review of the MIP images confirms the above findings IMPRESSION: 1. Negative for large vessel occlusion, and no  atherosclerosis or stenosis in the neck. 2. Positive for intracranial atherosclerosis with significant stenoses: - moderate to severe Left ICA anterior genu. - moderate bilateral ACA distal A2. - moderate Left PCA P1. 3. Subtle CT evidence of the acute right lacunar infarcts seen by MRI yesterday. No new intracranial abnormality. 4. Right parotid gland sialolithiasis. Electronically Signed   By: Genevie Ann M.D.   On: 07/12/2019 20:09   DG Chest 2 View  Result Date: 07/11/2019 CLINICAL DATA:  Shortness of breath EXAM: CHEST - 2 VIEW COMPARISON:  August 18, 2017 FINDINGS: Lungs are clear. Heart is upper normal in size with pulmonary vascularity normal. No adenopathy. There is degenerative change in the thoracic spine. IMPRESSION: Lungs clear.  Heart upper normal in size. Electronically Signed   By: Lowella Grip III M.D.   On: 07/11/2019 11:56   CT HEAD WO CONTRAST  Result Date: 07/11/2019 CLINICAL DATA:  Slurred speech and trouble swallowing EXAM: CT HEAD WITHOUT CONTRAST TECHNIQUE: Contiguous axial images were obtained from the base of the skull through the vertex without intravenous contrast. COMPARISON:  2014 FINDINGS: Brain: There is no acute intracranial hemorrhage, mass-effect, or edema. Gray-white differentiation is preserved. There is no extra-axial fluid collection. Ventricles and sulci are within normal limits in size and configuration. Vascular: No hyperdense vessel or unexpected calcification. Skull:  Calvarium is unremarkable. Sinuses/Orbits: No acute finding. Other: None. IMPRESSION: No acute intracranial hemorrhage, mass effect, or evidence of acute infarction. Electronically Signed   By: Macy Mis M.D.   On: 07/11/2019 12:01   CT ANGIO NECK W OR WO CONTRAST  Result Date: 07/12/2019 CLINICAL DATA:  79 year old female with acute lacunar 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 EXAM: CT ANGIOGRAPHY HEAD AND NECK TECHNIQUE: Multidetector CT imaging of the head and  neck was performed using the standard protocol during bolus administration of intravenous contrast. Multiplanar CT image reconstructions and MIPs were obtained to evaluate the vascular anatomy. Carotid stenosis measurements (when applicable) are obtained utilizing NASCET criteria, using the distal internal carotid diameter as the denominator. CONTRAST:  162mL OMNIPAQUE IOHEXOL 350 MG/ML SOLN COMPARISON:  Brain MRI and head CT 07/11/2019. FINDINGS: CT HEAD Brain: Subtle hypodensity in the right corona radiata corresponding to the diffusion abnormality with no intracranial hemorrhage or mass effect. Stable gray-white matter differentiation elsewhere. No ventriculomegaly. Calvarium and skull base: No acute osseous abnormality identified. Paranasal sinuses: Visualized paranasal sinuses and mastoids are stable and well pneumatized. Orbits: No acute orbit or scalp soft tissue finding. Small dystrophic calcifications in the right parotid gland. CTA NECK Skeleton: Cervical spine degeneration including mild C3-C4 spondylolisthesis. No acute osseous abnormality identified. Upper chest: Negative. Other neck: No acute soft tissue findings in the neck. Right parotid gland sialolithiasis again noted. Aortic arch: 3 vessel arch configuration with minimal arch atherosclerosis. Right carotid system: Mildly tortuous brachiocephalic artery and proximal right CCA without plaque or stenosis. Negative right carotid bifurcation. Tortuous cervical right ICA without plaque or stenosis. Left carotid system: Tortuous without plaque or stenosis. Vertebral arteries: Minimal plaque in the proximal right subclavian artery without stenosis. Normal right vertebral artery origin. Tortuous right V1 and V2 segments. The right vertebral is patent to the skull base without plaque or stenosis. Mild plaque in the proximal left subclavian artery without stenosis. Tortuosity of the vessel with a kinked appearance at the thoracic inlet. Normal left vertebral  artery origin. Tortuous left V1 and V2. Patent left vertebral to the skull base without plaque or stenosis. CTA HEAD Posterior circulation: Mild bilateral V4 segment plaque without stenosis. Patent PICA origins and vertebrobasilar junction. Patent basilar artery which is mildly tapered to the basilar tip (series 15, image 23). SCA and PCA origins remain patent. Posterior communicating arteries are diminutive or absent. Moderate irregularity and stenosis at the left P1/P2 junction on series 14, image 21. Contralateral mild right P2 and P3 stenosis. Anterior circulation: Both ICA siphons are patent. On the left there is moderate calcified plaque and moderate to severe stenosis at the anterior genu (series 13, image 116). Normal left ophthalmic artery origin. On the right similar bulky calcified plaque but no significant right siphon stenosis. Normal right ophthalmic artery origin. Patent carotid termini. Normal MCA and ACA origins. Dominant right A1. Anterior communicating artery and proximal ACA branches are normal. There is bilateral distal A2 moderate stenosis as seen on series 16, image 23 and series 15, image 17. Left MCA M1 segment and bifurcation are patent without stenosis. Left MCA branches are within normal limits. Right MCA M1 segment and bifurcation are patent without stenosis. Right MCA branches are within normal limits. Venous sinuses: Patent. Anatomic variants: Dominant right ACA A1. Review of the MIP images confirms the above findings IMPRESSION: 1. Negative for large vessel occlusion, and no atherosclerosis or stenosis in the neck. 2. Positive for intracranial atherosclerosis with significant  stenoses: - moderate to severe Left ICA anterior genu. - moderate bilateral ACA distal A2. - moderate Left PCA P1. 3. Subtle CT evidence of the acute right lacunar infarcts seen by MRI yesterday. No new intracranial abnormality. 4. Right parotid gland sialolithiasis. Electronically Signed   By: Genevie Ann M.D.   On:  07/12/2019 20:09   MR BRAIN WO CONTRAST  Result Date: 07/11/2019 CLINICAL DATA:  Slurred speech and trouble swallowing EXAM: MRI HEAD WITHOUT CONTRAST TECHNIQUE: Multiplanar, multiecho pulse sequences of the brain and surrounding structures were obtained without intravenous contrast. COMPARISON:  2014 FINDINGS: Motion artifact is present Brain: There are adjacent small foci of restricted diffusion centered within the right corona radiata with some involvement of the caudate body. No evidence of hemorrhage. Patchy and confluent areas of T2 hyperintensity in the supratentorial and pontine white matter are nonspecific but may reflect moderate chronic microvascular ischemic changes. There is no intracranial mass or significant mass effect. Ventricles and sulci are within normal limits in size and configuration. Vascular: Major vessel flow voids at the skull base are preserved. Skull and upper cervical spine: Marrow signal is within normal limits. Sinuses/Orbits: Minor paranasal sinus mucosal thickening. Bilateral lens replacements. Other: Mastoid air cells are clear.  Sella is unremarkable. IMPRESSION: Acute small vessel infarcts involving the right corona radiata and caudate. Moderate chronic microvascular ischemic changes. Electronically Signed   By: Macy Mis M.D.   On: 07/11/2019 15:56    Assessment/Plan: Diagnosis: right corona radiata and caudate infarcts  Stroke: Continue secondary stroke prophylaxis and Risk Factor Modification listed below:   Antiplatelet therapy:   Blood Pressure Management:  Continue current medication with prn's with permisive HTN per primary team Statin Agent:   Left sided hemiparesis PT/OT for mobility, ADL training  Motor recovery: Fluoxetine Labs independently reviewed.  Records reviewed and summated above.  1. Does the need for close, 24 hr/day medical supervision in concert with the patient's rehab needs make it unreasonable for this patient to be served in a  less intensive setting? Potentially 2. Co-Morbidities requiring supervision/potential complications: HTN (monitor and provide prns in accordance with increased physical exertion and pain), fatty liver, OSA, IBS, OA- right knee--needs replacement, LBP with LLE sciatica, HLD (cont meds) 3. Due to safety, disease management and patient education, does the patient require 24 hr/day rehab nursing? Potentially 4. Does the patient require coordinated care of a physician, rehab nurse, therapy disciplines of PT/OT to address physical and functional deficits in the context of the above medical diagnosis(es)? Potentially Addressing deficits in the following areas: balance, endurance, locomotion, strength, transferring, bathing, dressing, toileting, speech and psychosocial support 5. Can the patient actively participate in an intensive therapy program of at least 3 hrs of therapy per day at least 5 days per week? Yes 6. The potential for patient to make measurable gains while on inpatient rehab is good 7. Anticipated functional outcomes upon discharge from inpatient rehab are modified independent and supervision  with PT, modified independent and supervision with OT, modified independent and supervision with SLP. 8. Estimated rehab length of stay to reach the above functional goals is: 3-6 days. 9. Anticipated discharge destination: Home 10. Overall Rehab/Functional Prognosis: good  RECOMMENDATIONS: This patient's condition is appropriate for continued rehabilitative care in the following setting: Recommend CIR based on current-level of functioning. Patient has agreed to participate in recommended program. Yes Note that insurance prior authorization may be required for reimbursement for recommended care.  Comment: Rehab Admissions Coordinator to follow up.  I  have personally performed a face to face diagnostic evaluation, including, but not limited to relevant history and physical exam findings, of this  patient and developed relevant assessment and plan.  Additionally, I have reviewed and concur with the physician assistant's documentation above.   Delice Lesch, MD, ABPMR Bary Leriche, PA-C 07/13/2019

## 2019-07-12 NOTE — Evaluation (Signed)
Physical Therapy Evaluation Patient Details Name: [name] [name] MRN: [id number] DOB: 12-15-1940 Today's Date: 07/12/2019   History of Present Illness  79 y.o. female  with history of obesity, hypertension, hyperlipidemia, Lt sciatic nerve pain. Presented to ED 07/11/19 with a facial droop and dysarthria. 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 NIHSS=1; per neurologist permissive HTN up to 220/120  Clinical Impression   Pt admitted with above diagnosis. Patient currently has significantly impaired balance (scoring 18/56 on Berg Balance assessment) with difficulty finding midline (shifts to her left). She required mod assist to prevent fall during [place].  She denies history of falls or near falls, but admits her balance was "not the same" after multiple orthopedic surgeries (TKA, THA). She lives alone and was independent without using an assistive device PTA. She has a very supportive local family. Pt currently with functional limitations due to the deficits listed below (see PT Problem List). Pt will benefit from skilled PT to increase their independence and safety with mobility to allow discharge to the venue listed below.       Follow Up Recommendations CIR;Supervision/Assistance - 24 hour    Equipment Recommendations  Other (comment)(TBA, already has several things)    Recommendations for Other Services Rehab consult     Precautions / Restrictions Precautions Precautions: Fall Restrictions Weight Bearing Restrictions: No      Mobility  Bed Mobility Overal bed mobility: Needs Assistance Bed Mobility: Supine to Sit;Sit to Supine     Supine to sit: HOB elevated;Min assist Sit to supine: Min assist   General bed mobility comments: HOB remained elevated due to persistent nausea; incr effort with removing covers and coming to sit at EOB; nearly needed assist; +use of rail  Transfers Overall transfer level: Needs assistance Equipment used:  None Transfers: Sit to/from Stand Sit to Stand: Min assist         General transfer comment: steadying assist upon standing   Ambulation/Gait Ambulation/Gait assistance: Min assist Gait Distance (Feet): 30 Feet Assistive device: None Gait Pattern/deviations: Step-through pattern;Decreased step length - right;Decreased weight shift to right;Drifts right/left Gait velocity: decr   General Gait Details: patient with increasing lean to her left after 10 ft; drift to left and reach for outside support  Stairs            Wheelchair Mobility    Modified Rankin (Stroke Patients Only) Modified Rankin (Stroke Patients Only) Pre-Morbid Rankin Score: No symptoms Modified Rankin: Moderately severe disability     Balance Overall balance assessment: Needs assistance Sitting-balance support: Feet supported Sitting balance-Leahy Scale: Fair     Standing balance support: During functional activity;No upper extremity supported Standing balance-Leahy Scale: Poor Standing balance comment: static standing: with fatigue after 60 seconds, increased lean to her left with difficulty "fixing it"/finding midline         Rhomberg - Eyes Opened: 5(lean to left and then stepped feet apart) Rhomberg - Eyes Closed: 0   High Level Balance Comments: standing feet apart with eyes closed incr weight shift to her left, however did not step or require outside support x 10 sec Standardized Balance Assessment Standardized Balance Assessment : Berg Balance Test Berg Balance Test Sit to Stand: Needs minimal aid to stand or to stabilize Standing Unsupported: Able to stand 30 seconds unsupported Sitting with Back Unsupported but Feet Supported on Floor or Stool: Able to sit safely and securely 2 minutes Stand to Sit: Controls descent by using hands Transfers: Needs one person  to assist Standing Unsupported with Eyes Closed: Able to stand 10 seconds with supervision Standing Ubsupported with Feet  Together: Needs help to attain position and unable to hold for 15 seconds From Standing, Reach Forward with Outstretched Arm: Reaches forward but needs supervision From Standing Position, Pick up Object from Floor: Unable to pick up and needs supervision From Standing Position, Turn to Look Behind Over each Shoulder: Needs supervision when turning Turn 360 Degrees: Needs close supervision or verbal cueing Standing Unsupported, Alternately Place Feet on Step/Stool: Needs assistance to keep from falling or unable to try(difficult clearing lt foot; caught foot with near fall (mod ) Standing Unsupported, One Foot in Front: Loses balance while stepping or standing Standing on One Leg: Unable to try or needs assist to prevent fall Total Score: 18         Pertinent Vitals/Pain Pain Assessment: No/denies pain    Home Living Family/patient expects to be discharged to:: Private residence Living Arrangements: Alone Available Help at Discharge: Family Type of Home: Other(Comment)(condo) Home Access: Level entry     Home Layout: One level Home Equipment: Environmental consultant - 2 wheels;Bedside commode;Wheelchair - manual      Prior Function Level of Independence: Independent         Comments: multiple LE orthopedic issues plus sciatica with prior gait impairments, but did not use an AD; was going to OPPT for sciatica     Hand Dominance   Dominant Hand: Right    Extremity/Trunk Assessment   Upper Extremity Assessment Upper Extremity Assessment: Defer to OT evaluation    Lower Extremity Assessment Lower Extremity Assessment: Generalized weakness;LLE deficits/detail LLE Deficits / Details: h/o sciatica pain below buttock, but not to knee; denies sensation changes; grossly 3+/5 throughout    Cervical / Trunk Assessment Cervical / Trunk Assessment: Other exceptions Cervical / Trunk Exceptions: obesity  Communication   Communication: No difficulties  Cognition Arousal/Alertness: Suspect due  to medications(groggy; ? due to nausea meds) Behavior During Therapy: WFL for tasks assessed/performed Overall Cognitive Status: Within Functional Limits for tasks assessed                                 General Comments: for basic tasks, will continue to assess, A&Ox4      General Comments General comments (skin integrity, edema, etc.): Educated pt and daughter on post-acute levels of care; pt/family are adamant they do not want SNF; family can provide 24/7 amongst several people    Exercises     Assessment/Plan    PT Assessment Patient needs continued PT services  PT Problem List Decreased strength;Decreased balance;Decreased mobility;Decreased knowledge of use of DME;Decreased safety awareness;Decreased knowledge of precautions;Obesity       PT Treatment Interventions DME instruction;Gait training;Functional mobility training;Therapeutic activities;Balance training;Neuromuscular re-education;Patient/family education    PT Goals (Current goals can be found in the Care Plan section)  Acute Rehab PT Goals Patient Stated Goal: return home when able PT Goal Formulation: With patient/family Time For Goal Achievement: 07/26/19 Potential to Achieve Goals: Good    Frequency Min 4X/week   Barriers to discharge        Co-evaluation               AM-PAC PT "6 Clicks" Mobility  Outcome Measure Help needed turning from your back to your side while in a flat bed without using bedrails?: A Little Help needed moving from lying on your back to sitting on the  side of a flat bed without using bedrails?: A Little Help needed moving to and from a bed to a chair (including a wheelchair)?: A Little Help needed standing up from a chair using your arms (e.g., wheelchair or bedside chair)?: A Little Help needed to walk in hospital room?: A Little Help needed climbing 3-5 steps with a railing? : A Lot 6 Click Score: 17    End of Session Equipment Utilized During  Treatment: Gait belt Activity Tolerance: Patient tolerated treatment well Patient left: in bed;with call bell/phone within reach;with family/visitor present;Other (comment)(SLP at bedside for evaluatin) Nurse Communication: Mobility status PT Visit Diagnosis: Unsteadiness on feet (R26.81);Other symptoms and signs involving the nervous system (R29.898)    Time: [id number] PT Time Calculation (min) (ACUTE ONLY): 34 min   Charges:   PT Evaluation $PT Eval Moderate Complexity: 1 Mod PT Treatments $Therapeutic Activity: 8-22 mins         Arby Barrette, PT Pager [phone]   Rexanne Mano 07/12/2019, 2:39 PM

## 2019-07-12 NOTE — Progress Notes (Signed)
Patient indicated that she will not be using her Home CPAP tonight.  I offered a [place] PPV machine but patient declined.

## 2019-07-12 NOTE — Progress Notes (Signed)
OVERNIGHT EVENTS:   Received patient around 2245. First neuro NIH assessment completed at 2345= 1. 0130 NIH= 1.   Around 0330, patient was woken up for NIH assessment. Pt. Had left facial droop, worsening dysarthria and left arm drift. Called RRT nurse. While nurse was on his way, patient arm drift subsided, and left facial droop/dysarthria improved, although remains more prominent than in previous assessments. Will continue to monitor.

## 2019-07-12 NOTE — Progress Notes (Signed)
STROKE TEAM PROGRESS NOTE   INTERVAL HISTORY Her daughter is at the bedside.  She lives alone, multiple kids nearby who are willing to help her. Hopes to be able to return home alone. Therapy evals pending. . She still has some dysarthria and facial asymmetry but is improving.  She states she went to bed on Sunday night 1130 and was fine.  She woke up at 3:00 and walk to the bathroom and was okay.  When she woke up at 730 she noted weakness in the legs as well as facial droop and since admission facial droop has persisted the leg weakness has improved.  Vitals:   07/11/19 2200 07/11/19 2241 07/12/19 0330 07/12/19 0834  BP: (!) 143/81 (!) 158/80 136/78 (!) 170/95  Pulse: 82 85 76 81  Resp: 14 20  17   Temp:  075-GRM F (36.9 C)  98.2 F (36.8 C)  TempSrc:      SpO2: 95% 96% 96% 93%  Weight:  83 kg    Height:  5' (1.524 m)      CBC:  Recent Labs  Lab 07/11/19 1100 07/12/19 0144  WBC 8.9 8.1  NEUTROABS 6.5  --   HGB 16.4* 14.8  HCT 49.5* 44.6  MCV 96.1 94.3  PLT 260 [id number]    Basic Metabolic Panel:  Recent Labs  Lab 07/11/19 1100 07/12/19 0144  NA 140 138  K 4.4 3.9  CL 104 103  CO2 24 25  GLUCOSE 102* 101*  BUN 13 12  CREATININE 0.93 0.93  CALCIUM 10.1 9.5   Lipid Panel:     Component Value Date/Time   CHOL 222 (H) 07/12/2019 0144   CHOL 189 05/31/2015 1136   TRIG 295 (H) 07/12/2019 0144   HDL 33 (L) 07/12/2019 0144   HDL 38 (L) 05/31/2015 1136   CHOLHDL 6.7 07/12/2019 0144   VLDL 59 (H) 07/12/2019 0144   LDLCALC 130 (H) 07/12/2019 0144   LDLCALC 108 (H) 05/31/2015 1136   HgbA1c:  Lab Results  Component Value Date   HGBA1C 5.5 07/11/2019   Urine Drug Screen:     Component Value Date/Time   LABOPIA NONE DETECTED 07/11/2019 1100   COCAINSCRNUR NONE DETECTED 07/11/2019 1100   LABBENZ NONE DETECTED 07/11/2019 1100   AMPHETMU NONE DETECTED 07/11/2019 1100   THCU NONE DETECTED 07/11/2019 1100   LABBARB NONE DETECTED 07/11/2019 1100    Alcohol Level      Component Value Date/Time   ETH <10 07/11/2019 1100    IMAGING past 48 hours DG Chest 2 View  Result Date: 07/11/2019 CLINICAL DATA:  Shortness of breath EXAM: CHEST - 2 VIEW COMPARISON:  August 18, 2017 FINDINGS: Lungs are clear. Heart is upper normal in size with pulmonary vascularity normal. No adenopathy. There is degenerative change in the thoracic spine. IMPRESSION: Lungs clear.  Heart upper normal in size. Electronically Signed   By: Lowella Grip III M.D.   On: 07/11/2019 11:56   CT HEAD WO CONTRAST  Result Date: 07/11/2019 CLINICAL DATA:  Slurred speech and trouble swallowing EXAM: CT HEAD WITHOUT CONTRAST TECHNIQUE: Contiguous axial images were obtained from the base of the skull through the vertex without intravenous contrast. COMPARISON:  2014 FINDINGS: Brain: There is no acute intracranial hemorrhage, mass-effect, or edema. Gray-white differentiation is preserved. There is no extra-axial fluid collection. Ventricles and sulci are within normal limits in size and configuration. Vascular: No hyperdense vessel or unexpected calcification. Skull: Calvarium is unremarkable. Sinuses/Orbits: No acute finding. Other: None. IMPRESSION: No  acute intracranial hemorrhage, mass effect, or evidence of acute infarction. Electronically Signed   By: Macy Mis M.D.   On: 07/11/2019 12:01   MR BRAIN WO CONTRAST  Result Date: 07/11/2019 CLINICAL DATA:  Slurred speech and trouble swallowing EXAM: MRI HEAD WITHOUT CONTRAST TECHNIQUE: Multiplanar, multiecho pulse sequences of the brain and surrounding structures were obtained without intravenous contrast. COMPARISON:  2014 FINDINGS: Motion artifact is present Brain: There are adjacent small foci of restricted diffusion centered within the right corona radiata with some involvement of the caudate body. No evidence of hemorrhage. Patchy and confluent areas of T2 hyperintensity in the supratentorial and pontine white matter are nonspecific but may  reflect moderate chronic microvascular ischemic changes. There is no intracranial mass or significant mass effect. Ventricles and sulci are within normal limits in size and configuration. Vascular: Major vessel flow voids at the skull base are preserved. Skull and upper cervical spine: Marrow signal is within normal limits. Sinuses/Orbits: Minor paranasal sinus mucosal thickening. Bilateral lens replacements. Other: Mastoid air cells are clear.  Sella is unremarkable. IMPRESSION: Acute small vessel infarcts involving the right corona radiata and caudate. Moderate chronic microvascular ischemic changes. Electronically Signed   By: Macy Mis M.D.   On: 07/11/2019 15:56    PHYSICAL EXAM Pleasant mildly obese elderly Caucasian lady not in distress. . Afebrile. Head is nontraumatic. Neck is supple without bruit.    Cardiac exam no murmur or gallop. Lungs are clear to auscultation. Distal pulses are well felt. Neurological Exam ;  Awake  Alert oriented x 3.  Mildly dysarthric speech.  No aphasia.  Follows commands well.  Eye movements full without nystagmus.fundi were not visualized. Vision acuity and fields appear normal. Hearing is normal. Palatal movements are normal.  Mild left lower facial weakness.  Tongue midline.Normal strength, tone, reflexes and coordination.  Diminished fine finger movements on the left.  Mild left grip weakness.  Orbits right over left upper extremity.  Normal sensation. Gait deferred. NIH stroke scale 2.  Premorbid modified Rankin 1 ASSESSMENT/PLAN Ms. RADIE KALLAS is a 79 y.o. female with history of HTN, HLD, obesity presenting with dysarthria, L facial droop, mild L hemiparesis.   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   MRI  R corona radiata and caudate head small vessel disease. infarcts  CTA head & neck pending   2D Echo pending LDL 130  HgbA1c 5.5  Lovenox 40 mg sq daily for VTE  prophylaxis  No antithrombotic prior to admission, now on clopidogrel 75 mg daily and aspirin 324 mg. Change aspirin to 81. Continue DAPT x 3 weeks then aspirin alone.   Therapy recommendations: Pending  Disposition:  pending   Patient interested in participating in the [facility] will follow up for possible enrollment. Please contact them at [phone] for any questions.    Hypertensive Urgency  BP elevated on arrival 202/110  BP now 170s, Stable . Permissive hypertension (OK if < 220/120) but gradually normalize in 5-7 days . Long-term BP goal normotensive  Hyperlipidemia  Home meds:  No statin  Now on lipitor 80  LDL 130, goal < 70  Continue statin at discharge  Other Stroke Risk Factors  Advanced age  ETOH use, alcohol level <10, advised to drink no more than 1 drink(s) a day  Obesity, Body mass index is 35.74 kg/m., recommend weight loss, diet and exercise as appropriate. Additional  info requested from RN  Obstructive sleep apnea, on CPAP at home  Pt/dtr reports hx imaging evidence of small AAA    Other Active Problems  Chronic back pain  Hospital day # 1  I have personally obtained history,examined this patient, reviewed notes, independently viewed imaging studies, participated in medical decision making and plan of care.ROS completed by me personally and pertinent positives fully documented  I have made any additions or clarifications directly to the above note.  She presented with left-sided facial weakness and left leg weakness secondary to right basal ganglia infarct likely from small vessel disease.  Continue ongoing stroke work-up and therapy evaluation.  Recommend aspirin and Plavix for 3 weeks followed by aspirin alone. She is interested in participating in the [employer] stroke prevention trial( standard of care antiplatelets and Factor xi inhibitor versus standard of care antiplates and placebo)   She  was given information to review and decide.  Her daughter was also present at the bedside who is also interested. Greater than 50% time during this 35-minute visit was spent on counseling and coordination of care about stroke and discussion about stroke prevention and treatment and answering questions. Antony Contras, MD Medical Director [facility] Pager: [phone] 07/12/2019 4:42 PM   To contact Stroke Continuity provider, please refer to [email or web address] After hours, contact General Neurology

## 2019-07-12 NOTE — ED Provider Notes (Signed)
Clinical Course as of Jul 11 1149  Mon Jul 11, 2019  1528 Pt signed out to me by Dr. Tamera Punt.  Briefly this is a 79 yo female presenting to ED with leg weakness and possible facial droop (unclear if this is different from baseline, reportedly has facial unevenness), questionable voice change vs. Hoarseness.  No signs of cauda equina per initial EDP exam.  Patient feeling better.  Plan for MRI, ambulate patient, and discharge if negative for acute changes   [MT]  1658 Spoke to Dr Leonel Ramsay who is en route to hospital, will discuss case.  Patient has no clear neurological deficits on my exam   [MT]  1715 Dr Leonel Ramsay recommending admission to medicine, full dose aspirin   [MT]    Clinical Course User Index [MT] Yulieth Carrender, Carola Rhine, MD      Wyvonnia Dusky, MD 07/12/19 [phone]

## 2019-07-12 NOTE — Progress Notes (Addendum)
PROGRESS NOTE    [name] [name]  [id number] DOB: 04-Apr-1941 DOA: 07/11/2019 PCP: Tonia Ghent, MD     Brief Narrative: [name] [name] is a 79 y.o. female with medical history significant of hypertension, hyperlipidemia, chronic back pain, sleep apnea on CPAP at night who presents to the emergency department from home with complaint of weakness of bilateral lower extremities, slurred speech. 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 Neurology was consulted.  New events last 24 hours / Subjective: Continues to have left facial droop as well as some garbled speech. She states that she feels like there is something in her mouth which makes it hard for her to get words out. Stood up out of bed and felt weak.   Assessment & Plan:   Principal Problem:   Ischemic stroke ([place]) Active Problems:   HYPERCHOLESTEROLEMIA   Essential hypertension   Acute CVA (cerebrovascular accident) ([place])   Acute CVA -CT head: No acute intracranial hemorrhage, mass effect, or evidence of acute infarction. -MRI brain: Acute small vessel infarcts involving the right corona radiata and caudate. -Neurology following -Echocardiogram pending  -CTA head and neck pending  -PT OT SLP -Aspirin, Plavix -Lipitor  Hypertension -Allow permissive hypertension  Hyperlipidemia -LDL 130 -Lipitor  OSA -CPAP nightly    DVT prophylaxis: Lovenox  Code Status: Full code Family Communication: No family at bedside Disposition Plan:  . Patient is from home prior to admission. . Currently in-hospital treatment needed due to further stroke work-up, PT OT evaluation pending . Suspect patient will discharge back home in next 1 to 2 days   Consultants:   Neurology  Procedures:   None  Antimicrobials:  Anti-infectives (From admission, onward)   None        Objective: Vitals:   07/11/19 2200 07/11/19 2241 07/12/19 0330 07/12/19 0834  BP: (!) 143/81 (!) 158/80  136/78 (!) 170/95  Pulse: 82 85 76 81  Resp: 14 20  17   Temp:  98.4 F (36.9 C)  98.2 F (36.8 C)  TempSrc:      SpO2: 95% 96% 96% 93%  Weight:  83 kg    Height:  5' (1.524 m)     No intake or output data in the 24 hours ending 07/12/19 0943 Filed Weights   07/11/19 2241  Weight: 83 kg    Examination:  General exam: Appears calm and comfortable  Respiratory system: Clear to auscultation. Respiratory effort normal. No respiratory distress. No conversational dyspnea.  Cardiovascular system: S1 & S2 heard, RRR. No murmurs. No pedal edema. Gastrointestinal system: Abdomen is nondistended, soft and nontender. Normal bowel sounds heard. Central nervous system: Alert and oriented. Left facial droop, mild dysarthria, EOMI, no weakness of extremities  Extremities: Symmetric in appearance  Skin: No rashes, lesions or ulcers on exposed skin  Psychiatry: Judgement and insight appear normal. Mood & affect appropriate.   Data Reviewed: I have personally reviewed following labs and imaging studies  CBC: Recent Labs  Lab 07/11/19 1100 07/12/19 0144  WBC 8.9 8.1  NEUTROABS 6.5  --   HGB 16.4* 14.8  HCT 49.5* 44.6  MCV 96.1 94.3  PLT 260 [id number]   Basic Metabolic Panel: Recent Labs  Lab 07/11/19 1100 07/12/19 0144  NA 140 138  K 4.4 3.9  CL 104 103  CO2 24 25  GLUCOSE 102* 101*  BUN 13 12  CREATININE 0.93 0.93  CALCIUM 10.1 9.5   GFR: Estimated Creatinine Clearance: 49.2 mL/min (by C-G formula  based on SCr of 0.93 mg/dL). Liver Function Tests: Recent Labs  Lab 07/11/19 1100  AST 25  ALT 16  ALKPHOS 101  BILITOT 0.8  PROT 6.8  ALBUMIN 4.0   No results for input(s): LIPASE, AMYLASE in the last 168 hours. No results for input(s): AMMONIA in the last 168 hours. Coagulation Profile: Recent Labs  Lab 07/11/19 1100  INR 0.9   Cardiac Enzymes: No results for input(s): CKTOTAL, CKMB, CKMBINDEX, TROPONINI in the last 168 hours. BNP (last 3 results) No results for  input(s): PROBNP in the last 8760 hours. HbA1C: Recent Labs    07/11/19 1100  HGBA1C 5.5   CBG: No results for input(s): GLUCAP in the last 168 hours. Lipid Profile: Recent Labs    07/12/19 0144  CHOL 222*  HDL 33*  LDLCALC 130*  TRIG 295*  CHOLHDL 6.7   Thyroid Function Tests: No results for input(s): TSH, T4TOTAL, FREET4, T3FREE, THYROIDAB in the last 72 hours. Anemia Panel: No results for input(s): VITAMINB12, FOLATE, FERRITIN, TIBC, IRON, RETICCTPCT in the last 72 hours. Sepsis Labs: No results for input(s): PROCALCITON, LATICACIDVEN in the last 168 hours.  Recent Results (from the past 240 hour(s))  SARS CORONAVIRUS 2 (TAT 6-24 HRS) Nasopharyngeal Nasopharyngeal Swab     Status: None   Collection Time: 07/11/19  5:40 PM   Specimen: Nasopharyngeal Swab  Result Value Ref Range Status   SARS Coronavirus 2 NEGATIVE NEGATIVE Final    Comment: (NOTE) SARS-CoV-2 target nucleic acids are NOT DETECTED. The SARS-CoV-2 RNA is generally detectable in upper and lower respiratory specimens during the acute phase of infection. Negative results do not preclude SARS-CoV-2 infection, do not rule out co-infections with other pathogens, and should not be used as the sole basis for treatment or other patient management decisions. Negative results must be combined with clinical observations, patient history, and epidemiological information. The expected result is Negative. Fact Sheet for Patients: SugarRoll.be Fact Sheet for Healthcare Providers: https://www.woods-mathews.com/ This test is not yet approved or cleared by the [place] FDA and  has been authorized for detection and/or diagnosis of SARS-CoV-2 by FDA under an Emergency Use Authorization (EUA). This EUA will remain  in effect (meaning this test can be used) for the duration of the COVID-19 declaration under Section 56 4(b)(1) of the Act, 21 U.S.C. section 360bbb-3(b)(1), unless  the authorization is terminated or revoked sooner. Performed at [facility], [place] [address], [place], [place] [address]       Radiology Studies: DG Chest 2 View  Result Date: 07/11/2019 CLINICAL DATA:  Shortness of breath EXAM: CHEST - 2 VIEW COMPARISON:  August 18, 2017 FINDINGS: Lungs are clear. Heart is upper normal in size with pulmonary vascularity normal. No adenopathy. There is degenerative change in the thoracic spine. IMPRESSION: Lungs clear.  Heart upper normal in size. Electronically Signed   By: Lowella Grip III M.D.   On: 07/11/2019 11:56   CT HEAD WO CONTRAST  Result Date: 07/11/2019 CLINICAL DATA:  Slurred speech and trouble swallowing EXAM: CT HEAD WITHOUT CONTRAST TECHNIQUE: Contiguous axial images were obtained from the base of the skull through the vertex without intravenous contrast. COMPARISON:  2014 FINDINGS: Brain: There is no acute intracranial hemorrhage, mass-effect, or edema. Gray-white differentiation is preserved. There is no extra-axial fluid collection. Ventricles and sulci are within normal limits in size and configuration. Vascular: No hyperdense vessel or unexpected calcification. Skull: Calvarium is unremarkable. Sinuses/Orbits: No acute finding. Other: None. IMPRESSION: No acute intracranial hemorrhage,  mass effect, or evidence of acute infarction. Electronically Signed   By: Macy Mis M.D.   On: 07/11/2019 12:01   MR BRAIN WO CONTRAST  Result Date: 07/11/2019 CLINICAL DATA:  Slurred speech and trouble swallowing EXAM: MRI HEAD WITHOUT CONTRAST TECHNIQUE: Multiplanar, multiecho pulse sequences of the brain and surrounding structures were obtained without intravenous contrast. COMPARISON:  2014 FINDINGS: Motion artifact is present Brain: There are adjacent small foci of restricted diffusion centered within the right corona radiata with some involvement of the caudate body. No evidence of hemorrhage. Patchy and confluent areas of T2 hyperintensity  in the supratentorial and pontine white matter are nonspecific but may reflect moderate chronic microvascular ischemic changes. There is no intracranial mass or significant mass effect. Ventricles and sulci are within normal limits in size and configuration. Vascular: Major vessel flow voids at the skull base are preserved. Skull and upper cervical spine: Marrow signal is within normal limits. Sinuses/Orbits: Minor paranasal sinus mucosal thickening. Bilateral lens replacements. Other: Mastoid air cells are clear.  Sella is unremarkable. IMPRESSION: Acute small vessel infarcts involving the right corona radiata and caudate. Moderate chronic microvascular ischemic changes. Electronically Signed   By: Macy Mis M.D.   On: 07/11/2019 15:56      Scheduled Meds: . aspirin  324 mg Oral Daily  . atorvastatin  80 mg Oral q1800  . cholecalciferol  1,000 Units Oral Daily  . enoxaparin (LOVENOX) injection  40 mg Subcutaneous Q24H   Continuous Infusions:   LOS: 1 day      Time spent: 35 minutes   Dessa Phi, DO [facility] 07/12/2019, 9:43 AM   Available via Epic secure chat 7am-7pm After these hours, please refer to coverage provider listed on [email or web address]

## 2019-07-12 NOTE — Evaluation (Signed)
Clinical/Bedside Swallow Evaluation Patient Details  Name: [name] [name] MRN: [id number] Date of Birth: 17-Feb-1941  Today's Date: 07/12/2019 Time: SLP Start Time (ACUTE ONLY): 1406 SLP Stop Time (ACUTE ONLY): 1426 SLP Time Calculation (min) (ACUTE ONLY): 20 min  Past Medical History:  Past Medical History:  Diagnosis Date  . Arthritis    knees  . Depression   . Fatty liver   . GERD (gastroesophageal reflux disease)   . Hyperlipidemia   . Hypertension   . IBS (irritable bowel syndrome)   . Obesity   . Pneumonia   . Sleep apnea    uses CPAP   Past Surgical History:  Past Surgical History:  Procedure Laterality Date  . BLADDER SURGERY    . BREAST SURGERY     breast biopsy  . BROW LIFT Bilateral 04/14/2017   Procedure: BLEPHAROPLASTY UPPER EYELID WITH EXCESS SKIN;  Surgeon: Karle Starch, MD;  Location: [facility];  Service: Ophthalmology;  Laterality: Bilateral;  . CATARACT EXTRACTION W/PHACO Left 02/05/2016   Procedure: CATARACT EXTRACTION PHACO AND INTRAOCULAR LENS PLACEMENT (IOC);  Surgeon: Birder Robson, MD;  Location: [facility];  Service: Ophthalmology;  Laterality: Left;  [place] 01:10 AP% 22.3 CDE 15.67 Fluid pack lot # [id number]  . CATARACT EXTRACTION W/PHACO Right 02/26/2016   Procedure: CATARACT EXTRACTION PHACO AND INTRAOCULAR LENS PLACEMENT (IOC);  Surgeon: Birder Robson, MD;  Location: [facility];  Service: Ophthalmology;  Laterality: Right;  [place] 57.4 AP% 24.0 CDE 13.75 Fluid Pack lot # [id number]  . CHOLECYSTECTOMY    . COLONOSCOPY WITH PROPOFOL N/A 12/18/2014   Procedure: COLONOSCOPY WITH PROPOFOL;  Surgeon: Manya Silvas, MD;  Location: [facility];  Service: Endoscopy;  Laterality: N/A;  . DEEP NECK LYMPH NODE BIOPSY / EXCISION    . JOINT REPLACEMENT    . KNEE ARTHROPLASTY Left 06/20/2015   Procedure: COMPUTER ASSISTED TOTAL KNEE ARTHROPLASTY;  Surgeon: Dereck Leep, MD;  Location: [facility];  Service: Orthopedics;  Laterality: Left;  . PTOSIS  REPAIR Bilateral 04/14/2017   Procedure: PTOSIS REPAIR RESECT EX;  Surgeon: Karle Starch, MD;  Location: [facility];  Service: Ophthalmology;  Laterality: Bilateral;  sleep apnea  . TONSILLECTOMY    . TOTAL HIP ARTHROPLASTY Right 07/21/2018   Procedure: TOTAL HIP ARTHROPLASTY ANTERIOR APPROACH;  Surgeon: Gaynelle Arabian, MD;  Location: [facility];  Service: Orthopedics;  Laterality: Right;   HPI:  79 y.o. female  with history of obesity, hypertension, hyperlipidemia, Lt sciatic nerve pain. Presented to ED 07/11/19 with a facial droop and dysarthria. 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 CXR lungsl clear. Drooling, coughing with thin and prolonged mastication per RN.   Assessment / Plan / Recommendation Clinical Impression  Pt demonstrated a questionable pharyngeal and/or esophageal dysphagia. She reports pharyngeal/chest globus sensation prior to admit and takes Tums (stopped meds due to possible side effects). Obvious throat clear immediately and delayed thin liquids and eructation x 1. Insrumental testing needed to evaluate pharyngeal integrity with various textures and planned for tomorrow. Continue regular/thin, pills with water, sit upright and stay up 30 min.   SLP Visit Diagnosis: Dysphagia, unspecified (R13.10)    Aspiration Risk  Mild aspiration risk    Diet Recommendation Regular;Thin liquid   Liquid Administration via: Cup;Straw Medication Administration: Whole meds with liquid Supervision: Patient able to self feed Compensations: Other (Comment)(no verbalizing after swallows) Postural Changes: Seated upright at 90 degrees;Remain upright for at least 30 minutes after po intake  Other  Recommendations Oral Care Recommendations: Oral care BID   Follow up Recommendations Inpatient Rehab      Frequency and Duration            Prognosis        Swallow Study   General HPI: 79 y.o. female  with history of obesity,  hypertension, hyperlipidemia, Lt sciatic nerve pain. Presented to ED 07/11/19 with a facial droop and dysarthria. 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 CXR lungsl clear. Drooling, coughing with thin and prolonged mastication per RN. Type of Study: Bedside Swallow Evaluation Previous Swallow Assessment: (none) Diet Prior to this Study: Regular;Thin liquids Temperature Spikes Noted: No Respiratory Status: Room air History of Recent Intubation: No Behavior/Cognition: Alert;Cooperative;Pleasant mood Oral Cavity Assessment: Within Functional Limits Oral Care Completed by SLP: No Oral Cavity - Dentition: Adequate natural dentition Vision: Functional for self-feeding Self-Feeding Abilities: Able to feed self Patient Positioning: Upright in bed Baseline Vocal Quality: Normal Volitional Cough: Strong Volitional Swallow: Able to elicit    Oral/Motor/Sensory Function Overall Oral Motor/Sensory Function: Mild impairment Facial ROM: Reduced left Facial Symmetry: Abnormal symmetry left Lingual Symmetry: Abnormal symmetry left   Ice Chips Ice chips: Not tested   Thin Liquid Thin Liquid: Impaired Presentation: Cup Oral Phase Impairments: Reduced labial seal Oral Phase Functional Implications: Left anterior spillage Pharyngeal  Phase Impairments: Throat Clearing - Delayed;Throat Clearing - Immediate    Nectar Thick Nectar Thick Liquid: Not tested   Honey Thick Honey Thick Liquid: Not tested   Puree Puree: Within functional limits   Solid     Solid: Within functional limits      Houston Siren 07/12/2019,3:14 PM   Orbie Pyo [place].Ed Risk analyst [phone] Office [phone]

## 2019-07-12 NOTE — Plan of Care (Signed)

## 2019-07-12 NOTE — Procedures (Signed)
Patient feeling nauseous. Requested that we attempt 07/13/19. Will attempt again.

## 2019-07-12 NOTE — Evaluation (Addendum)
Occupational Therapy Evaluation Patient Details Name: [name] [name] MRN: [id number] DOB: 24-Nov-1940 Today's Date: 07/12/2019    History of Present Illness 79 y.o. female  with history of obesity, hypertension, hyperlipidemia, Lt sciatic nerve pain. Presented to ED 07/11/19 with a facial droop and dysarthria. 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 NIHSS=1; per neurologist permissive HTN up to 220/120   Clinical Impression   This 79 y/o female presents with the above. PTA pt reports independence with ADL, iADL and functional mobility, lives alone. Pt currently presenting with reports of headache/nausea, weakness and decreased standing balance impacting her functional performance. Pt requiring minA for room level mobility (use of HHA), toileting and standing grooming ADL. Pt with gross LUE weakness (compared to RUE) and mild L lateral lean with mobility tasks. Also noted pt tending to approach doorways/items in room quite closely towards her L side (?L inattention/L side perceptual deficits). She will benefit from continued acute OT services and given current deficits currently recommend follow up CIR level therapies after discharge to progress pt towards her PLOF. Will follow.     Follow Up Recommendations   CIR; supervision/24hr assist    Equipment Recommendations  Tub/shower seat           Precautions / Restrictions Precautions Precautions: Fall Restrictions Weight Bearing Restrictions: No      Mobility Bed Mobility Overal bed mobility: Needs Assistance Bed Mobility: Supine to Sit;Sit to Supine     Supine to sit: HOB elevated;Min assist Sit to supine: Min assist   General bed mobility comments: pt seeking HHA for trunk elevation, cues to scoot towards EOB, light assist for LE management when returning to supine, RN reports Echo arriving soon therfore ended session in bed vs recliner  Transfers Overall transfer level: Needs  assistance Equipment used: None Transfers: Sit to/from Stand Sit to Stand: Min assist         General transfer comment: boosting and steadying assist, provided HHA upon standing, stood from EOB and toilet    Balance Overall balance assessment: Needs assistance Sitting-balance support: Feet supported Sitting balance-Leahy Scale: Fair     Standing balance support: Single extremity supported;During functional activity Standing balance-Leahy Scale: Fair Standing balance comment: overall pt reliant on at least single UE support or external assist                           ADL either performed or assessed with clinical judgement   ADL Overall ADL's : Needs assistance/impaired Eating/Feeding: Modified independent;Sitting   Grooming: Minimal assistance;Standing;Wash/dry hands   Upper Body Bathing: Min guard;Sitting   Lower Body Bathing: Minimal assistance;Sit to/from stand   Upper Body Dressing : Min guard;Set up;Sitting   Lower Body Dressing: Minimal assistance;Sit to/from stand   Toilet Transfer: Minimal assistance;Ambulation;Regular Glass blower/designer Details (indicate cue type and reason): use of HHA Toileting- Clothing Manipulation and Hygiene: Minimal assistance;Sit to/from stand Toileting - Clothing Manipulation Details (indicate cue type and reason): assist for balance while pt completes clothing management (gown and underwear)     Functional mobility during ADLs: Minimal assistance(HHA) General ADL Comments: pt with reports of headache/nausea this session, completed toileting and standing grooming ADL during session     Vision Baseline Vision/History: Cataracts Patient Visual Report: No change from baseline Additional Comments: will continue to assess, deferred formal assessment due to headache/nausea and pt requesting return to supine     Perception     Praxis  Pertinent Vitals/Pain Pain Assessment: Faces Faces Pain Scale: Hurts even  more Pain Location: headache Pain Descriptors / Indicators: Headache;Discomfort Pain Intervention(s): Limited activity within patient's tolerance;Monitored during session;Premedicated before session     Hand Dominance Right   Extremity/Trunk Assessment Upper Extremity Assessment Upper Extremity Assessment: LUE deficits/detail LUE Deficits / Details: grossly weaker than RUE (4-/5)  LUE Sensation: (denies sensation loss) LUE Coordination: WNL   Lower Extremity Assessment Lower Extremity Assessment: Defer to PT evaluation       Communication Communication Communication: No difficulties   Cognition Arousal/Alertness: Awake/alert Behavior During Therapy: WFL for tasks assessed/performed Overall Cognitive Status: Within Functional Limits for tasks assessed                                 General Comments: for basic tasks, will continue to assess, A&Ox4   General Comments       Exercises     Shoulder Instructions      Home Living Family/patient expects to be discharged to:: Private residence Living Arrangements: Alone Available Help at Discharge: Family Type of Home: Other(Comment)(condo) Home Access: Level entry     Home Layout: One level     Bathroom Shower/Tub: Occupational psychologist: Standard     Home Equipment: Environmental consultant - 2 wheels;Bedside commode;Wheelchair - manual          Prior Functioning/Environment Level of Independence: Independent                 OT Problem List: Decreased strength;Decreased range of motion;Decreased activity tolerance;Impaired balance (sitting and/or standing);Pain;Decreased knowledge of use of DME or AE      OT Treatment/Interventions: Self-care/ADL training;Therapeutic exercise;Energy conservation;DME and/or AE instruction;Therapeutic activities;Patient/family education;Balance training    OT Goals(Current goals can be found in the care plan section) Acute Rehab OT Goals Patient Stated Goal:  return home when able OT Goal Formulation: With patient Time For Goal Achievement: 07/26/19 Potential to Achieve Goals: Good  OT Frequency: Min 2X/week   Barriers to D/C:            Co-evaluation              AM-PAC OT "6 Clicks" Daily Activity     Outcome Measure Help from another person eating meals?: None Help from another person taking care of personal grooming?: A Little Help from another person toileting, which includes using toliet, bedpan, or urinal?: A Little Help from another person bathing (including washing, rinsing, drying)?: A Little Help from another person to put on and taking off regular upper body clothing?: A Little Help from another person to put on and taking off regular lower body clothing?: A Little 6 Click Score: 19   End of Session Equipment Utilized During Treatment: Gait belt Nurse Communication: Mobility status  Activity Tolerance: Patient tolerated treatment well Patient left: in bed;with call bell/phone within reach;with bed alarm set  OT Visit Diagnosis: Unsteadiness on feet (R26.81);Muscle weakness (generalized) (M62.81);Other symptoms and signs involving the nervous system (R29.898)                Time: 0940-1000 OT Time Calculation (min): 20 min Charges:  OT General Charges $OT Visit: 1 Visit OT Evaluation $OT Eval Moderate Complexity: 1 Mod  Lou Cal, OT [employer] Pager [phone] Office [phone]   Raymondo Band 07/12/2019, 12:53 PM

## 2019-07-12 NOTE — Progress Notes (Signed)
Patient, pleasant, alert and oriented X4 at bedside. Patient has some left sided weakness. When attempted to get up to use BR patient had an unsteady gait and was leaning to left.

## 2019-07-13 ENCOUNTER — Inpatient Hospital Stay (HOSPITAL_COMMUNITY): Payer: Medicare HMO

## 2019-07-13 DIAGNOSIS — I639 Cerebral infarction, unspecified: Secondary | ICD-10-CM

## 2019-07-13 DIAGNOSIS — E782 Mixed hyperlipidemia: Secondary | ICD-10-CM

## 2019-07-13 DIAGNOSIS — M1711 Unilateral primary osteoarthritis, right knee: Secondary | ICD-10-CM

## 2019-07-13 DIAGNOSIS — G8929 Other chronic pain: Secondary | ICD-10-CM

## 2019-07-13 DIAGNOSIS — I6389 Other cerebral infarction: Secondary | ICD-10-CM

## 2019-07-13 DIAGNOSIS — M5442 Lumbago with sciatica, left side: Secondary | ICD-10-CM

## 2019-07-13 DIAGNOSIS — G4733 Obstructive sleep apnea (adult) (pediatric): Secondary | ICD-10-CM

## 2019-07-13 DIAGNOSIS — I10 Essential (primary) hypertension: Secondary | ICD-10-CM

## 2019-07-13 LAB — ECHOCARDIOGRAM COMPLETE
Height: 60 in
Weight: 2928 oz

## 2019-07-13 MED ORDER — LORAZEPAM 0.5 MG PO TABS
0.5000 mg | ORAL_TABLET | ORAL | Status: AC
  Administered 2019-07-13: 0.5 mg via ORAL
  Filled 2019-07-13 (×2): qty 1

## 2019-07-13 NOTE — Progress Notes (Signed)
Modified Barium Swallow Progress Note  Patient Details  Name: [name] [name] MRN: [id number] Date of Birth: 1940/10/02  Today's Date: 07/13/2019  Modified Barium Swallow completed.  Full report located under Chart Review in the Imaging Section.  Brief recommendations include the following:  Clinical Impression  Minimal oropharyngeal dysphagia demonstrated and suspected esophageal involvement. There were two instances of thin barium entering laryngeal vestibule prior to full epiglottic deflection however penetrates appeared to be ejected fully during the swallow. Material observed entering upper esophageal sphincter with mild hesitation to transit x 1 with eructation. Elevation of larynx was decreased intermittently therefore pill lodged in valleculae with thin liquid trials and transited with additional sips thin. MBS does not diagnose below the level of the  UES however esophageal scan revealed retention (mild) in mid esophagus that did not clear at end of study. She may benefit from GI consult. Recommend she continue regular texture, thin liquids, cups preferred, pills whole in puree, remain upright after meals, small bites/sips, alternate liquids and solids and refrain from verbalizing after swallows. Will follow to reiterate strategies and provide further feedback.          Swallow Evaluation Recommendations       SLP Diet Recommendations: Regular solids;Thin liquid   Liquid Administration via: Cup   Medication Administration: Whole meds with puree   Supervision: Patient able to self feed   Compensations: Minimize environmental distractions;Slow rate;Small sips/bites   Postural Changes: Remain semi-upright after after feeds/meals (Comment);Seated upright at 90 degrees   Oral Care Recommendations: Oral care BID        [place] Siren 07/13/2019,9:56 AM  Orbie Pyo [place].Ed Risk analyst [phone] Office [phone]

## 2019-07-13 NOTE — Progress Notes (Signed)
[name] Kitchen  PROGRESS NOTE    [name] [name]  [id number] DOB: 12-02-1940 DOA: 07/11/2019 PCP: Tonia Ghent, MD   Brief Narrative:   [name] [name] is a 79 y.o.femalewith medical history significant ofhypertension, hyperlipidemia, chronic back pain, sleep apneaon CPAP at nightwho presents to the emergency departmentfrom home with complaint of weaknessof bilateral lower extremities, slurred speech.MRI showed acute small vessel infarction involving coronary radiata and caudate. Neurology was consulted.  07/13/19: No acute events ON. MRI for Bayer study today. PT/OT rec CIR. CIR has eval'd. Awaiting insurance approval. Can start BP normalization tomorrow. Otherwise well.    Assessment & Plan:   Principal Problem:   Ischemic stroke ([name] [name]) Active Problems:   HYPERCHOLESTEROLEMIA   Essential hypertension   Acute CVA (cerebrovascular accident) ([place])   Mixed hyperlipidemia   OSA (obstructive sleep apnea)  Acute CVA     - CT head: No acute intracranial hemorrhage, mass effect, or evidence of acute infarction.     - MRI brain: Acute small vessel infarcts involving the right corona radiata and caudate.     - CAT H/N: Positive for intracranial atherosclerosis with significant stenoses:- moderate to severe Left ICA anterior genu. - moderate bilateral ACA distal A2. - moderate Left PCA P1.     - Neurology onboard     - Echocardiogram results pending      - PT/OT/SLP: rec CIR; they have eval'd; awaiting insurance approval     - HDL: 33, LDL 130, Tchol 222     - A1c: 5.5      - DAPT, statin     - BP control starting tomorrow     - SLP: reg, thin liquid  Hypertension     - Allow permissive hypertension through today     - start BP control in AM  Hyperlipidemia     - LDL 130     - Lipitor  OSA     - CPAP nightly  DVT prophylaxis: lovenox Code Status: FULL Family Communication: None at bedside   Disposition Plan: To CIR when approved by insurance  Consultants:   Neurology    ROS:  Denies CP, N, V, dyspnea . Remainder 10-pt ROS is negative for all not previously mentioned.  Subjective: "Will this improve?"  Objective: Vitals:   07/12/19 1644 07/12/19 2342 07/13/19 0740 07/13/19 1621  BP: (!) 148/91 139/86 (!) 156/104 (!) 156/80  Pulse: 82 94 94 85  Resp: 19 20 20 19   Temp:  98.3 F (36.8 C) 98.4 F (36.9 C) 98.3 F (36.8 C)  TempSrc:      SpO2: 94% 95% 94% 95%  Weight:      Height:       No intake or output data in the 24 hours ending 07/13/19 1643 Filed Weights   07/11/19 2241  Weight: 83 kg    Examination:  General: 79 y.o. female resting in bed in NAD Cardiovascular: RRR, +S1, S2, no m/g/r, equal pulses throughout Respiratory: CTABL, no w/r/r, normal WOB GI: BS+, NDNT, no masses noted, no organomegaly noted MSK: No e/c/c Neuro: Alert to name, follows commands Psyc: Appropriate interaction and affect, calm/cooperative   Data Reviewed: I have personally reviewed following labs and imaging studies.  CBC: Recent Labs  Lab 07/11/19 1100 07/12/19 0144  WBC 8.9 8.1  NEUTROABS 6.5  --   HGB 16.4* 14.8  HCT 49.5* 44.6  MCV 96.1 94.3  PLT 260 [id number]   Basic Metabolic Panel: Recent Labs  Lab 07/11/19 1100 07/12/19 0144  NA 140 138  K 4.4 3.9  CL 104 103  CO2 24 25  GLUCOSE 102* 101*  BUN 13 12  CREATININE 0.93 0.93  CALCIUM 10.1 9.5   GFR: Estimated Creatinine Clearance: 49.2 mL/min (by C-G formula based on SCr of 0.93 mg/dL). Liver Function Tests: Recent Labs  Lab 07/11/19 1100  AST 25  ALT 16  ALKPHOS 101  BILITOT 0.8  PROT 6.8  ALBUMIN 4.0   No results for input(s): LIPASE, AMYLASE in the last 168 hours. No results for input(s): AMMONIA in the last 168 hours. Coagulation Profile: Recent Labs  Lab 07/11/19 1100  INR 0.9   Cardiac Enzymes: No results for input(s): CKTOTAL, CKMB, CKMBINDEX, TROPONINI in the last 168 hours. BNP (last 3 results) No results for input(s): PROBNP in the last 8760  hours. HbA1C: Recent Labs    07/11/19 1100  HGBA1C 5.5   CBG: No results for input(s): GLUCAP in the last 168 hours. Lipid Profile: Recent Labs    07/12/19 0144  CHOL 222*  HDL 33*  LDLCALC 130*  TRIG 295*  CHOLHDL 6.7   Thyroid Function Tests: No results for input(s): TSH, T4TOTAL, FREET4, T3FREE, THYROIDAB in the last 72 hours. Anemia Panel: No results for input(s): VITAMINB12, FOLATE, FERRITIN, TIBC, IRON, RETICCTPCT in the last 72 hours. Sepsis Labs: No results for input(s): PROCALCITON, LATICACIDVEN in the last 168 hours.  Recent Results (from the past 240 hour(s))  SARS CORONAVIRUS 2 (TAT 6-24 HRS) Nasopharyngeal Nasopharyngeal Swab     Status: None   Collection Time: 07/11/19  5:40 PM   Specimen: Nasopharyngeal Swab  Result Value Ref Range Status   SARS Coronavirus 2 NEGATIVE NEGATIVE Final    Comment: (NOTE) SARS-CoV-2 target nucleic acids are NOT DETECTED. The SARS-CoV-2 RNA is generally detectable in upper and lower respiratory specimens during the acute phase of infection. Negative results do not preclude SARS-CoV-2 infection, do not rule out co-infections with other pathogens, and should not be used as the sole basis for treatment or other patient management decisions. Negative results must be combined with clinical observations, patient history, and epidemiological information. The expected result is Negative. Fact Sheet for Patients: SugarRoll.be Fact Sheet for Healthcare Providers: https://www.woods-mathews.com/ This test is not yet approved or cleared by the [place] FDA and  has been authorized for detection and/or diagnosis of SARS-CoV-2 by FDA under an Emergency Use Authorization (EUA). This EUA will remain  in effect (meaning this test can be used) for the duration of the COVID-19 declaration under Section 56 4(b)(1) of the Act, 21 U.S.C. section 360bbb-3(b)(1), unless the authorization is terminated  or revoked sooner. Performed at [facility], [place] [address] [place], [place] [address]       Radiology Studies: CT ANGIO HEAD W OR WO CONTRAST  Result Date: 07/12/2019 CLINICAL DATA:  79 year old female with acute lacunar 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 EXAM: CT ANGIOGRAPHY HEAD AND NECK TECHNIQUE: Multidetector CT imaging of the head and neck was performed using the standard protocol during bolus administration of intravenous contrast. Multiplanar CT image reconstructions and MIPs were obtained to evaluate the vascular anatomy. Carotid stenosis measurements (when applicable) are obtained utilizing NASCET criteria, using the distal internal carotid diameter as the denominator. CONTRAST:  169mL OMNIPAQUE IOHEXOL 350 MG/ML SOLN COMPARISON:  Brain MRI and head CT 07/11/2019. FINDINGS: CT HEAD Brain: Subtle hypodensity in the right corona radiata corresponding to the diffusion abnormality with no intracranial hemorrhage or mass effect.  Stable gray-white matter differentiation elsewhere. No ventriculomegaly. Calvarium and skull base: No acute osseous abnormality identified. Paranasal sinuses: Visualized paranasal sinuses and mastoids are stable and well pneumatized. Orbits: No acute orbit or scalp soft tissue finding. Small dystrophic calcifications in the right parotid gland. CTA NECK Skeleton: Cervical spine degeneration including mild C3-C4 spondylolisthesis. No acute osseous abnormality identified. Upper chest: Negative. Other neck: No acute soft tissue findings in the neck. Right parotid gland sialolithiasis again noted. Aortic arch: 3 vessel arch configuration with minimal arch atherosclerosis. Right carotid system: Mildly tortuous brachiocephalic artery and proximal right CCA without plaque or stenosis. Negative right carotid bifurcation. Tortuous cervical right ICA without plaque or stenosis. Left carotid system: Tortuous without plaque or stenosis. Vertebral  arteries: Minimal plaque in the proximal right subclavian artery without stenosis. Normal right vertebral artery origin. Tortuous right V1 and V2 segments. The right vertebral is patent to the skull base without plaque or stenosis. Mild plaque in the proximal left subclavian artery without stenosis. Tortuosity of the vessel with a kinked appearance at the thoracic inlet. Normal left vertebral artery origin. Tortuous left V1 and V2. Patent left vertebral to the skull base without plaque or stenosis. CTA HEAD Posterior circulation: Mild bilateral V4 segment plaque without stenosis. Patent PICA origins and vertebrobasilar junction. Patent basilar artery which is mildly tapered to the basilar tip (series 15, image 23). SCA and PCA origins remain patent. Posterior communicating arteries are diminutive or absent. Moderate irregularity and stenosis at the left P1/P2 junction on series 14, image 21. Contralateral mild right P2 and P3 stenosis. Anterior circulation: Both ICA siphons are patent. On the left there is moderate calcified plaque and moderate to severe stenosis at the anterior genu (series 13, image 116). Normal left ophthalmic artery origin. On the right similar bulky calcified plaque but no significant right siphon stenosis. Normal right ophthalmic artery origin. Patent carotid termini. Normal MCA and ACA origins. Dominant right A1. Anterior communicating artery and proximal ACA branches are normal. There is bilateral distal A2 moderate stenosis as seen on series 16, image 23 and series 15, image 17. Left MCA M1 segment and bifurcation are patent without stenosis. Left MCA branches are within normal limits. Right MCA M1 segment and bifurcation are patent without stenosis. Right MCA branches are within normal limits. Venous sinuses: Patent. Anatomic variants: Dominant right ACA A1. Review of the MIP images confirms the above findings IMPRESSION: 1. Negative for large vessel occlusion, and no atherosclerosis or  stenosis in the neck. 2. Positive for intracranial atherosclerosis with significant stenoses: - moderate to severe Left ICA anterior genu. - moderate bilateral ACA distal A2. - moderate Left PCA P1. 3. Subtle CT evidence of the acute right lacunar infarcts seen by MRI yesterday. No new intracranial abnormality. 4. Right parotid gland sialolithiasis. Electronically Signed   By: Genevie Ann M.D.   On: 07/12/2019 20:09   CT ANGIO NECK W OR WO CONTRAST  Result Date: 07/12/2019 CLINICAL DATA:  79 year old female with acute lacunar 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 EXAM: CT ANGIOGRAPHY HEAD AND NECK TECHNIQUE: Multidetector CT imaging of the head and neck was performed using the standard protocol during bolus administration of intravenous contrast. Multiplanar CT image reconstructions and MIPs were obtained to evaluate the vascular anatomy. Carotid stenosis measurements (when applicable) are obtained utilizing NASCET criteria, using the distal internal carotid diameter as the denominator. CONTRAST:  118mL OMNIPAQUE IOHEXOL 350 MG/ML SOLN COMPARISON:  Brain MRI and head CT 07/11/2019. FINDINGS:  CT HEAD Brain: Subtle hypodensity in the right corona radiata corresponding to the diffusion abnormality with no intracranial hemorrhage or mass effect. Stable gray-white matter differentiation elsewhere. No ventriculomegaly. Calvarium and skull base: No acute osseous abnormality identified. Paranasal sinuses: Visualized paranasal sinuses and mastoids are stable and well pneumatized. Orbits: No acute orbit or scalp soft tissue finding. Small dystrophic calcifications in the right parotid gland. CTA NECK Skeleton: Cervical spine degeneration including mild C3-C4 spondylolisthesis. No acute osseous abnormality identified. Upper chest: Negative. Other neck: No acute soft tissue findings in the neck. Right parotid gland sialolithiasis again noted. Aortic arch: 3 vessel arch configuration with minimal arch  atherosclerosis. Right carotid system: Mildly tortuous brachiocephalic artery and proximal right CCA without plaque or stenosis. Negative right carotid bifurcation. Tortuous cervical right ICA without plaque or stenosis. Left carotid system: Tortuous without plaque or stenosis. Vertebral arteries: Minimal plaque in the proximal right subclavian artery without stenosis. Normal right vertebral artery origin. Tortuous right V1 and V2 segments. The right vertebral is patent to the skull base without plaque or stenosis. Mild plaque in the proximal left subclavian artery without stenosis. Tortuosity of the vessel with a kinked appearance at the thoracic inlet. Normal left vertebral artery origin. Tortuous left V1 and V2. Patent left vertebral to the skull base without plaque or stenosis. CTA HEAD Posterior circulation: Mild bilateral V4 segment plaque without stenosis. Patent PICA origins and vertebrobasilar junction. Patent basilar artery which is mildly tapered to the basilar tip (series 15, image 23). SCA and PCA origins remain patent. Posterior communicating arteries are diminutive or absent. Moderate irregularity and stenosis at the left P1/P2 junction on series 14, image 21. Contralateral mild right P2 and P3 stenosis. Anterior circulation: Both ICA siphons are patent. On the left there is moderate calcified plaque and moderate to severe stenosis at the anterior genu (series 13, image 116). Normal left ophthalmic artery origin. On the right similar bulky calcified plaque but no significant right siphon stenosis. Normal right ophthalmic artery origin. Patent carotid termini. Normal MCA and ACA origins. Dominant right A1. Anterior communicating artery and proximal ACA branches are normal. There is bilateral distal A2 moderate stenosis as seen on series 16, image 23 and series 15, image 17. Left MCA M1 segment and bifurcation are patent without stenosis. Left MCA branches are within normal limits. Right MCA M1 segment  and bifurcation are patent without stenosis. Right MCA branches are within normal limits. Venous sinuses: Patent. Anatomic variants: Dominant right ACA A1. Review of the MIP images confirms the above findings IMPRESSION: 1. Negative for large vessel occlusion, and no atherosclerosis or stenosis in the neck. 2. Positive for intracranial atherosclerosis with significant stenoses: - moderate to severe Left ICA anterior genu. - moderate bilateral ACA distal A2. - moderate Left PCA P1. 3. Subtle CT evidence of the acute right lacunar infarcts seen by MRI yesterday. No new intracranial abnormality. 4. Right parotid gland sialolithiasis. Electronically Signed   By: Genevie Ann M.D.   On: 07/12/2019 20:09   MR BRAIN WO CONTRAST  Result Date: 07/13/2019 CLINICAL DATA:  Stroke, follow-up. Additional history provided: [employer] stroke trial protocol, no new symptoms or complaints. EXAM: MRI HEAD WITHOUT CONTRAST TECHNIQUE: Multiplanar, multiecho pulse sequences of the brain and surrounding structures were obtained without intravenous contrast. COMPARISON:  CT angiogram head/neck 07/12/2019, brain MRI 07/11/2019. FINDINGS: Brain: A research protocol brain MRI was performed. The acquired sequences consist of axial diffusion-weighted imaging, an axial T2 FLAIR sequence, axial SWI sequence, axial T2 weighted  sequence and axial T1 weighted sequence. Restricted diffusion consistent with early subacute info centered within the right corona radiata/body of caudate, slightly increased in size as compared to MRI 07/11/2019. Corresponding T2/FLAIR hyperintensity at this site. No evidence of hemorrhage at this site. No new acute infarct is demonstrated elsewhere within the brain. Moderate patchy T2/FLAIR hyperintensity within the cerebral white matter is unchanged from prior examination and nonspecific, but consistent with chronic small vessel ischemic disease. Mild chronic ischemic changes are also present within the pons.  Redemonstrated punctate chronic microhemorrhage within the left parietal lobe (series 4, image 66). Cerebral volume is normal for age. There is no evidence of intracranial mass. No midline shift or extra-axial fluid collection. Vascular: Flow voids maintained within the proximal large arterial vessels. Skull and upper cervical spine: No focal marrow lesion. Sinuses/Orbits: Visualized orbits demonstrate no acute abnormality. Minimal ethmoid sinus mucosal thickening. No significant mastoid effusion. IMPRESSION: 1. Early subacute infarct centered within the right corona radiata/caudate body, slightly increased in size from MRI 07/11/2019. No significant mass effect. No evidence of hemorrhage at this site. 2. No new acute infarct demonstrated elsewhere within the brain. 3. Stable background of moderate chronic small vessel ischemic disease. Electronically Signed   By: Kellie Simmering DO   On: 07/13/2019 12:13   DG Swallowing Func-Speech Pathology  Result Date: 07/13/2019 Objective Swallowing Evaluation: Type of Study: MBS-Modified Barium Swallow Study  Patient Details Name: KELAH SPROUSE MRN: [id number] Date of Birth: 02/26/1941 Today's Date: 07/13/2019 Time: SLP Start Time (ACUTE ONLY): 0900 -SLP Stop Time (ACUTE ONLY): 0915 SLP Time Calculation (min) (ACUTE ONLY): 15 min Past Medical History: Past Medical History: Diagnosis Date . Arthritis   knees . Depression  . Fatty liver  . GERD (gastroesophageal reflux disease)  . Hyperlipidemia  . Hypertension  . IBS (irritable bowel syndrome)  . Obesity  . Pneumonia  . Sleep apnea   uses CPAP Past Surgical History: Past Surgical History: Procedure Laterality Date . BLADDER SURGERY   . BREAST SURGERY    breast biopsy . BROW LIFT Bilateral 04/14/2017  Procedure: BLEPHAROPLASTY UPPER EYELID WITH EXCESS SKIN;  Surgeon: Karle Starch, MD;  Location: [facility];  Service: Ophthalmology;  Laterality: Bilateral; . CATARACT EXTRACTION W/PHACO Left 02/05/2016  Procedure: CATARACT  EXTRACTION PHACO AND INTRAOCULAR LENS PLACEMENT (IOC);  Surgeon: Birder Robson, MD;  Location: [facility];  Service: Ophthalmology;  Laterality: Left;  [place] 01:10 AP% 22.3 CDE 15.67 Fluid pack lot # [id number] . CATARACT EXTRACTION W/PHACO Right 02/26/2016  Procedure: CATARACT EXTRACTION PHACO AND INTRAOCULAR LENS PLACEMENT (IOC);  Surgeon: Birder Robson, MD;  Location: [facility];  Service: Ophthalmology;  Laterality: Right;  [place] 57.4 AP% 24.0 CDE 13.75 Fluid Pack lot # [id number] . CHOLECYSTECTOMY   . COLONOSCOPY WITH PROPOFOL N/A 12/18/2014  Procedure: COLONOSCOPY WITH PROPOFOL;  Surgeon: Manya Silvas, MD;  Location: [facility];  Service: Endoscopy;  Laterality: N/A; . DEEP NECK LYMPH NODE BIOPSY / EXCISION   . JOINT REPLACEMENT   . KNEE ARTHROPLASTY Left 06/20/2015  Procedure: COMPUTER ASSISTED TOTAL KNEE ARTHROPLASTY;  Surgeon: Dereck Leep, MD;  Location: [facility];  Service: Orthopedics;  Laterality: Left; . PTOSIS REPAIR Bilateral 04/14/2017  Procedure: PTOSIS REPAIR RESECT EX;  Surgeon: Karle Starch, MD;  Location: [facility];  Service: Ophthalmology;  Laterality: Bilateral;  sleep apnea . TONSILLECTOMY   . TOTAL HIP ARTHROPLASTY Right 07/21/2018  Procedure: TOTAL HIP ARTHROPLASTY ANTERIOR APPROACH;  Surgeon: Gaynelle Arabian, MD;  Location: [facility];  Service:  Orthopedics;  Laterality: Right; HPI: 79 y.o. female  with history of obesity, hypertension, hyperlipidemia, Lt sciatic nerve pain. Presented to ED 07/11/19 with a facial droop and dysarthria. 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 CXR lungsl clear. Drooling, coughing with thin and prolonged mastication per RN.  No data recorded Assessment / Plan / Recommendation CHL IP CLINICAL IMPRESSIONS 07/13/2019 Clinical Impression Minimal oropharyngeal dysphagia demonstrated and suspected esophageal involvement. There were two instances of thin barium entering laryngeal vestibule prior to full epiglottic deflection  however penetrates appeared to be ejected fully during the swallow. Material observed entering upper esophageal sphincter with mild hesitation to transit x 1 with eructation. Elevation of larynx was decreased intermittently therefore pill lodged in valleculae with thin liquid trials and transited with additional sips thin. MBS does not diagnose below the level of the  UES however esophageal scan revealed retention (mild) in mid esophagus that did not clear at end of study. She may benefit from GI consult. Recommend she continue regular texture, thin liquids, cups preferred, pills whole in puree, remain upright after meals, small bites/sips, alternate liquids and solids and refrain from verbalizing after swallows. Will follow to reiterate strategies and provide further feedback.        SLP Visit Diagnosis Dysphagia, pharyngoesophageal phase (R13.14) Attention and concentration deficit following -- Frontal lobe and executive function deficit following -- Impact on safety and function Mild aspiration risk   CHL IP TREATMENT RECOMMENDATION 07/13/2019 Treatment Recommendations Therapy as outlined in treatment plan below   Prognosis 07/13/2019 Prognosis for Safe Diet Advancement Good Barriers to Reach Goals -- Barriers/Prognosis Comment -- CHL IP DIET RECOMMENDATION 07/13/2019 SLP Diet Recommendations Regular solids;Thin liquid Liquid Administration via Cup Medication Administration Whole meds with puree Compensations Minimize environmental distractions;Slow rate;Small sips/bites Postural Changes Remain semi-upright after after feeds/meals (Comment);Seated upright at 90 degrees   CHL IP OTHER RECOMMENDATIONS 07/13/2019 Recommended Consults -- Oral Care Recommendations Oral care BID Other Recommendations --   CHL IP FOLLOW UP RECOMMENDATIONS 07/13/2019 Follow up Recommendations Inpatient Rehab   CHL IP FREQUENCY AND DURATION 07/13/2019 Speech Therapy Frequency (ACUTE ONLY) min 2x/week Treatment Duration 2 weeks      CHL IP ORAL  PHASE 07/13/2019 Oral Phase Impaired Oral - Pudding Teaspoon -- Oral - Pudding Cup -- Oral - Honey Teaspoon -- Oral - Honey Cup -- Oral - Nectar Teaspoon -- Oral - Nectar Cup -- Oral - Nectar Straw -- Oral - Thin Teaspoon -- Oral - Thin Cup WFL Oral - Thin Straw WFL Oral - Puree -- Oral - Mech Soft -- Oral - Regular Delayed oral transit Oral - Multi-Consistency -- Oral - Pill WFL Oral Phase - Comment --  CHL IP PHARYNGEAL PHASE 07/13/2019 Pharyngeal Phase Impaired Pharyngeal- Pudding Teaspoon -- Pharyngeal -- Pharyngeal- Pudding Cup -- Pharyngeal -- Pharyngeal- Honey Teaspoon -- Pharyngeal -- Pharyngeal- Honey Cup -- Pharyngeal -- Pharyngeal- Nectar Teaspoon -- Pharyngeal -- Pharyngeal- Nectar Cup -- Pharyngeal -- Pharyngeal- Nectar Straw -- Pharyngeal -- Pharyngeal- Thin Teaspoon -- Pharyngeal -- Pharyngeal- Thin Cup Penetration/Aspiration during swallow Pharyngeal Material enters airway, remains ABOVE vocal cords then ejected out Pharyngeal- Thin Straw Penetration/Aspiration during swallow Pharyngeal Material enters airway, remains ABOVE vocal cords then ejected out Pharyngeal- Puree -- Pharyngeal -- Pharyngeal- Mechanical Soft -- Pharyngeal -- Pharyngeal- Regular WFL Pharyngeal -- Pharyngeal- Multi-consistency -- Pharyngeal -- Pharyngeal- Pill Pharyngeal residue - valleculae;Reduced anterior laryngeal mobility Pharyngeal -- Pharyngeal Comment --  CHL IP CERVICAL ESOPHAGEAL PHASE 07/13/2019 Cervical Esophageal Phase (  No Data) Pudding Teaspoon -- Pudding Cup -- Honey Teaspoon -- Honey Cup -- Nectar Teaspoon -- Nectar Cup -- Nectar Straw -- Thin Teaspoon -- Thin Cup -- Thin Straw -- Puree -- Mechanical Soft -- Regular -- Multi-consistency -- Pill -- Cervical Esophageal Comment -- [place] Siren 07/13/2019, 9:56 AM Orbie Pyo Colvin Caroli.Ed Actor Pager [phone] Office [phone]                Scheduled Meds: . aspirin  81 mg Oral Daily  . atorvastatin  80 mg Oral q1800  .  cholecalciferol  1,000 Units Oral Daily  . clopidogrel  75 mg Oral Daily  . enoxaparin (LOVENOX) injection  40 mg Subcutaneous Q24H  . BAY [id number] or placebo ([place])  1 tablet Oral Daily   And  . BAY [id number] or placebo (Blue Bottle)  1 tablet Oral Daily   Continuous Infusions:   LOS: 2 days    Time spent: 25 minutes spent in the coordination of care today.    Jonnie Finner, DO [facility]  If 7PM-7AM, please contact night-coverage [email or web address] 07/13/2019, 4:43 PM

## 2019-07-13 NOTE — Progress Notes (Signed)
Physical Therapy Treatment Patient Details Name: [name] [name] MRN: [id number] DOB: 1941-03-20 Today's Date: 07/13/2019    History of Present Illness 79 y.o. female  with history of obesity, hypertension, hyperlipidemia, Lt sciatic nerve pain. Presented to ED 07/11/19 with a facial droop and dysarthria. 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 NIHSS=1; per neurologist permissive HTN up to 220/120    PT Comments    Pt was up with daughter assisting her to the restroom. She was agreeable to participate with therapy. Pt was able to increase ambulation distance during today's session using the RW. Pt did not use RW at baseline, but did briefly after a hip replacement. She reports feeling more stable with RW. Had pt perform sit<>stand 3 different ways to place the R LE at a disadvantage. Pt continues to demonstrate L LE weaknss as she was unable to rise into standing when R LE was placed far forward. Current plan remains appropriate. Will continue to follow acutely.    Follow Up Recommendations  CIR;Supervision/Assistance - 24 hour     Equipment Recommendations  Other (comment)(TBA, already has several things)    Recommendations for Other Services Rehab consult     Precautions / Restrictions Precautions Precautions: Fall Restrictions Weight Bearing Restrictions: No    Mobility  Bed Mobility               General bed mobility comments: Pt up in restroom with daughter on arrival  Transfers Overall transfer level: Needs assistance Equipment used: None Transfers: Sit to/from Stand Sit to Stand: Min guard         General transfer comment: min guard for balance. No physical assist  Ambulation/Gait Ambulation/Gait assistance: Min assist;Min guard Gait Distance (Feet): 100 Feet Assistive device: Rolling walker (2 wheeled) Gait Pattern/deviations: Step-through pattern;Decreased step length - right;Decreased weight shift to right;Drifts  right/left Gait velocity: decr   General Gait Details: Pt requiring VC secondary to L drift. Does well with RW and pt reports feeling more comfortable with the RW for support   Stairs             Wheelchair Mobility    Modified Rankin (Stroke Patients Only) Modified Rankin (Stroke Patients Only) Pre-Morbid Rankin Score: No symptoms Modified Rankin: Moderately severe disability     Balance Overall balance assessment: Needs assistance Sitting-balance support: Feet supported Sitting balance-Leahy Scale: Fair     Standing balance support: During functional activity;No upper extremity supported Standing balance-Leahy Scale: Fair                              Cognition Arousal/Alertness: Awake/alert Behavior During Therapy: WFL for tasks assessed/performed Overall Cognitive Status: Within Functional Limits for tasks assessed                                        Exercises Other Exercises Other Exercises: sit <> stand; 3 different ways; feet in front, R LE to the side; and R LE forward; performed for neuromuscular re-ed.  Pt unable to perform with RLE placed forward at a disadvantage.    General Comments        Pertinent Vitals/Pain Pain Assessment: No/denies pain    Home Living                      Prior Function  PT Goals (current goals can now be found in the care plan section) Acute Rehab PT Goals Patient Stated Goal: return home when able PT Goal Formulation: With patient/family Time For Goal Achievement: 07/26/19 Potential to Achieve Goals: Good Progress towards PT goals: Progressing toward goals    Frequency    Min 4X/week      PT Plan Current plan remains appropriate    Co-evaluation              AM-PAC PT "6 Clicks" Mobility   Outcome Measure  Help needed turning from your back to your side while in a flat bed without using bedrails?: A Little Help needed moving from lying on your  back to sitting on the side of a flat bed without using bedrails?: A Little Help needed moving to and from a bed to a chair (including a wheelchair)?: A Little Help needed standing up from a chair using your arms (e.g., wheelchair or bedside chair)?: A Little Help needed to walk in hospital room?: A Little Help needed climbing 3-5 steps with a railing? : A Lot 6 Click Score: 17    End of Session Equipment Utilized During Treatment: Gait belt Activity Tolerance: Patient tolerated treatment well Patient left: with call bell/phone within reach;with family/visitor present;in chair Nurse Communication: Mobility status PT Visit Diagnosis: Unsteadiness on feet (R26.81);Other symptoms and signs involving the nervous system (R29.898)     Time: [id number] PT Time Calculation (min) (ACUTE ONLY): 24 min  Charges:  $Gait Training: 8-22 mins $Neuromuscular Re-education: 8-22 mins                     Benjiman Core, [place] Pager [phone] Acute Rehab    Allena Katz 07/13/2019, 1:43 PM

## 2019-07-13 NOTE — Progress Notes (Signed)
  Speech Language Pathology Treatment: Dysphagia  Patient Details Name: [name] [name] MRN: [id number] DOB: 05/17/1941 Today's Date: 07/13/2019 Time: [id number] SLP Time Calculation (min) (ACUTE ONLY): 15 min  Assessment / Plan / Recommendation Clinical Impression  Purpose of session was to provide more in depth education with pt and daughter re: this mornings MBS results. SLP reviewed findings and answered questions. Advised pt to consider GI assessment once discharged from hospital. She reported having EGD "years go" where she was told it did not need to be stretched. Reviewed strategies to mitigate penetration risks and esophageal symptoms. **Daughter mentioned (out of ear shot of pt) memory difficulties since stroke (did not recall what test she had this morning or walking with PT). Pt is concerned with her left facial weakness. If pt able to go to rehab, ST can perform cognitive assessment. If not, this therapist can request order.    HPI HPI: 79 y.o. female  with history of obesity, hypertension, hyperlipidemia, Lt sciatic nerve pain. Presented to ED 07/11/19 with a facial droop and dysarthria. 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 CXR lungsl clear. Drooling, coughing with thin and prolonged mastication per RN.      SLP Plan  Continue with current plan of care       Recommendations  Diet recommendations: Regular;Thin liquid Liquids provided via: Cup Medication Administration: Whole meds with puree Supervision: Patient able to self feed;Full supervision/cueing for compensatory strategies Compensations: Slow rate;Minimize environmental distractions;Small sips/bites Postural Changes and/or Swallow Maneuvers: Seated upright 90 degrees;Upright 30-60 min after meal                Oral Care Recommendations: Oral care BID Follow up Recommendations: Inpatient Rehab SLP Visit Diagnosis: Dysphagia, pharyngoesophageal phase (R13.14) Plan: Continue  with current plan of care                       Houston Siren 07/13/2019, 4:26 PM  Orbie Pyo Colvin Caroli.Ed Risk analyst [phone] Office [phone]

## 2019-07-13 NOTE — Progress Notes (Signed)
STROKE TEAM PROGRESS NOTE   INTERVAL HISTORY Her daughter is at the bedside.    She still has some dysarthria and facial asymmetry but is improving.  She had repeat MRI this morning as part of the [employer] stroke study which shows slight increase in size of her infarct but clinically there are no changes Echocardiogram is pending.  LDL cholesterol is elevated at 130 mg percent and hemoglobin A1c 5.5 physical therapist have recommended inpatient rehab Vitals:   07/12/19 1644 07/12/19 2342 07/13/19 0740 07/13/19 1621  BP: (!) 148/91 139/86 (!) 156/104 (!) 156/80  Pulse: 82 94 94 85  Resp: 19 20 20 19   Temp:  98.3 F (36.8 C) 98.4 F (36.9 C) 98.3 F (36.8 C)  TempSrc:      SpO2: 94% 95% 94% 95%  Weight:      Height:        CBC:  Recent Labs  Lab 07/11/19 1100 07/12/19 0144  WBC 8.9 8.1  NEUTROABS 6.5  --   HGB 16.4* 14.8  HCT 49.5* 44.6  MCV 96.1 94.3  PLT 260 [id number]    Basic Metabolic Panel:  Recent Labs  Lab 07/11/19 1100 07/12/19 0144  NA 140 138  K 4.4 3.9  CL 104 103  CO2 24 25  GLUCOSE 102* 101*  BUN 13 12  CREATININE 0.93 0.93  CALCIUM 10.1 9.5   Lipid Panel:     Component Value Date/Time   CHOL 222 (H) 07/12/2019 0144   CHOL 189 05/31/2015 1136   TRIG 295 (H) 07/12/2019 0144   HDL 33 (L) 07/12/2019 0144   HDL 38 (L) 05/31/2015 1136   CHOLHDL 6.7 07/12/2019 0144   VLDL 59 (H) 07/12/2019 0144   LDLCALC 130 (H) 07/12/2019 0144   LDLCALC 108 (H) 05/31/2015 1136   HgbA1c:  Lab Results  Component Value Date   HGBA1C 5.5 07/11/2019   Urine Drug Screen:     Component Value Date/Time   LABOPIA NONE DETECTED 07/11/2019 1100   COCAINSCRNUR NONE DETECTED 07/11/2019 1100   LABBENZ NONE DETECTED 07/11/2019 1100   AMPHETMU NONE DETECTED 07/11/2019 1100   THCU NONE DETECTED 07/11/2019 1100   LABBARB NONE DETECTED 07/11/2019 1100    Alcohol Level     Component Value Date/Time   ETH <10 07/11/2019 1100    IMAGING past 48 hours CT ANGIO HEAD W OR  WO CONTRAST  Result Date: 07/12/2019 CLINICAL DATA:  79 year old female with acute lacunar 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 EXAM: CT ANGIOGRAPHY HEAD AND NECK TECHNIQUE: Multidetector CT imaging of the head and neck was performed using the standard protocol during bolus administration of intravenous contrast. Multiplanar CT image reconstructions and MIPs were obtained to evaluate the vascular anatomy. Carotid stenosis measurements (when applicable) are obtained utilizing NASCET criteria, using the distal internal carotid diameter as the denominator. CONTRAST:  164mL OMNIPAQUE IOHEXOL 350 MG/ML SOLN COMPARISON:  Brain MRI and head CT 07/11/2019. FINDINGS: CT HEAD Brain: Subtle hypodensity in the right corona radiata corresponding to the diffusion abnormality with no intracranial hemorrhage or mass effect. Stable gray-white matter differentiation elsewhere. No ventriculomegaly. Calvarium and skull base: No acute osseous abnormality identified. Paranasal sinuses: Visualized paranasal sinuses and mastoids are stable and well pneumatized. Orbits: No acute orbit or scalp soft tissue finding. Small dystrophic calcifications in the right parotid gland. CTA NECK Skeleton: Cervical spine degeneration including mild C3-C4 spondylolisthesis. No acute osseous abnormality identified. Upper chest: Negative. Other neck: No acute soft  tissue findings in the neck. Right parotid gland sialolithiasis again noted. Aortic arch: 3 vessel arch configuration with minimal arch atherosclerosis. Right carotid system: Mildly tortuous brachiocephalic artery and proximal right CCA without plaque or stenosis. Negative right carotid bifurcation. Tortuous cervical right ICA without plaque or stenosis. Left carotid system: Tortuous without plaque or stenosis. Vertebral arteries: Minimal plaque in the proximal right subclavian artery without stenosis. Normal right vertebral artery origin. Tortuous right V1 and V2  segments. The right vertebral is patent to the skull base without plaque or stenosis. Mild plaque in the proximal left subclavian artery without stenosis. Tortuosity of the vessel with a kinked appearance at the thoracic inlet. Normal left vertebral artery origin. Tortuous left V1 and V2. Patent left vertebral to the skull base without plaque or stenosis. CTA HEAD Posterior circulation: Mild bilateral V4 segment plaque without stenosis. Patent PICA origins and vertebrobasilar junction. Patent basilar artery which is mildly tapered to the basilar tip (series 15, image 23). SCA and PCA origins remain patent. Posterior communicating arteries are diminutive or absent. Moderate irregularity and stenosis at the left P1/P2 junction on series 14, image 21. Contralateral mild right P2 and P3 stenosis. Anterior circulation: Both ICA siphons are patent. On the left there is moderate calcified plaque and moderate to severe stenosis at the anterior genu (series 13, image 116). Normal left ophthalmic artery origin. On the right similar bulky calcified plaque but no significant right siphon stenosis. Normal right ophthalmic artery origin. Patent carotid termini. Normal MCA and ACA origins. Dominant right A1. Anterior communicating artery and proximal ACA branches are normal. There is bilateral distal A2 moderate stenosis as seen on series 16, image 23 and series 15, image 17. Left MCA M1 segment and bifurcation are patent without stenosis. Left MCA branches are within normal limits. Right MCA M1 segment and bifurcation are patent without stenosis. Right MCA branches are within normal limits. Venous sinuses: Patent. Anatomic variants: Dominant right ACA A1. Review of the MIP images confirms the above findings IMPRESSION: 1. Negative for large vessel occlusion, and no atherosclerosis or stenosis in the neck. 2. Positive for intracranial atherosclerosis with significant stenoses: - moderate to severe Left ICA anterior genu. -  moderate bilateral ACA distal A2. - moderate Left PCA P1. 3. Subtle CT evidence of the acute right lacunar infarcts seen by MRI yesterday. No new intracranial abnormality. 4. Right parotid gland sialolithiasis. Electronically Signed   By: Genevie Ann M.D.   On: 07/12/2019 20:09   CT ANGIO NECK W OR WO CONTRAST  Result Date: 07/12/2019 CLINICAL DATA:  79 year old female with acute lacunar 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 EXAM: CT ANGIOGRAPHY HEAD AND NECK TECHNIQUE: Multidetector CT imaging of the head and neck was performed using the standard protocol during bolus administration of intravenous contrast. Multiplanar CT image reconstructions and MIPs were obtained to evaluate the vascular anatomy. Carotid stenosis measurements (when applicable) are obtained utilizing NASCET criteria, using the distal internal carotid diameter as the denominator. CONTRAST:  120mL OMNIPAQUE IOHEXOL 350 MG/ML SOLN COMPARISON:  Brain MRI and head CT 07/11/2019. FINDINGS: CT HEAD Brain: Subtle hypodensity in the right corona radiata corresponding to the diffusion abnormality with no intracranial hemorrhage or mass effect. Stable gray-white matter differentiation elsewhere. No ventriculomegaly. Calvarium and skull base: No acute osseous abnormality identified. Paranasal sinuses: Visualized paranasal sinuses and mastoids are stable and well pneumatized. Orbits: No acute orbit or scalp soft tissue finding. Small dystrophic calcifications in the right parotid gland. CTA  NECK Skeleton: Cervical spine degeneration including mild C3-C4 spondylolisthesis. No acute osseous abnormality identified. Upper chest: Negative. Other neck: No acute soft tissue findings in the neck. Right parotid gland sialolithiasis again noted. Aortic arch: 3 vessel arch configuration with minimal arch atherosclerosis. Right carotid system: Mildly tortuous brachiocephalic artery and proximal right CCA without plaque or stenosis. Negative  right carotid bifurcation. Tortuous cervical right ICA without plaque or stenosis. Left carotid system: Tortuous without plaque or stenosis. Vertebral arteries: Minimal plaque in the proximal right subclavian artery without stenosis. Normal right vertebral artery origin. Tortuous right V1 and V2 segments. The right vertebral is patent to the skull base without plaque or stenosis. Mild plaque in the proximal left subclavian artery without stenosis. Tortuosity of the vessel with a kinked appearance at the thoracic inlet. Normal left vertebral artery origin. Tortuous left V1 and V2. Patent left vertebral to the skull base without plaque or stenosis. CTA HEAD Posterior circulation: Mild bilateral V4 segment plaque without stenosis. Patent PICA origins and vertebrobasilar junction. Patent basilar artery which is mildly tapered to the basilar tip (series 15, image 23). SCA and PCA origins remain patent. Posterior communicating arteries are diminutive or absent. Moderate irregularity and stenosis at the left P1/P2 junction on series 14, image 21. Contralateral mild right P2 and P3 stenosis. Anterior circulation: Both ICA siphons are patent. On the left there is moderate calcified plaque and moderate to severe stenosis at the anterior genu (series 13, image 116). Normal left ophthalmic artery origin. On the right similar bulky calcified plaque but no significant right siphon stenosis. Normal right ophthalmic artery origin. Patent carotid termini. Normal MCA and ACA origins. Dominant right A1. Anterior communicating artery and proximal ACA branches are normal. There is bilateral distal A2 moderate stenosis as seen on series 16, image 23 and series 15, image 17. Left MCA M1 segment and bifurcation are patent without stenosis. Left MCA branches are within normal limits. Right MCA M1 segment and bifurcation are patent without stenosis. Right MCA branches are within normal limits. Venous sinuses: Patent. Anatomic variants:  Dominant right ACA A1. Review of the MIP images confirms the above findings IMPRESSION: 1. Negative for large vessel occlusion, and no atherosclerosis or stenosis in the neck. 2. Positive for intracranial atherosclerosis with significant stenoses: - moderate to severe Left ICA anterior genu. - moderate bilateral ACA distal A2. - moderate Left PCA P1. 3. Subtle CT evidence of the acute right lacunar infarcts seen by MRI yesterday. No new intracranial abnormality. 4. Right parotid gland sialolithiasis. Electronically Signed   By: Genevie Ann M.D.   On: 07/12/2019 20:09   MR BRAIN WO CONTRAST  Result Date: 07/13/2019 CLINICAL DATA:  Stroke, follow-up. Additional history provided: [employer] stroke trial protocol, no new symptoms or complaints. EXAM: MRI HEAD WITHOUT CONTRAST TECHNIQUE: Multiplanar, multiecho pulse sequences of the brain and surrounding structures were obtained without intravenous contrast. COMPARISON:  CT angiogram head/neck 07/12/2019, brain MRI 07/11/2019. FINDINGS: Brain: A research protocol brain MRI was performed. The acquired sequences consist of axial diffusion-weighted imaging, an axial T2 FLAIR sequence, axial SWI sequence, axial T2 weighted sequence and axial T1 weighted sequence. Restricted diffusion consistent with early subacute info centered within the right corona radiata/body of caudate, slightly increased in size as compared to MRI 07/11/2019. Corresponding T2/FLAIR hyperintensity at this site. No evidence of hemorrhage at this site. No new acute infarct is demonstrated elsewhere within the brain. Moderate patchy T2/FLAIR hyperintensity within the cerebral white matter is unchanged from prior examination and  nonspecific, but consistent with chronic small vessel ischemic disease. Mild chronic ischemic changes are also present within the pons. Redemonstrated punctate chronic microhemorrhage within the left parietal lobe (series 4, image 66). Cerebral volume is normal for age. There is  no evidence of intracranial mass. No midline shift or extra-axial fluid collection. Vascular: Flow voids maintained within the proximal large arterial vessels. Skull and upper cervical spine: No focal marrow lesion. Sinuses/Orbits: Visualized orbits demonstrate no acute abnormality. Minimal ethmoid sinus mucosal thickening. No significant mastoid effusion. IMPRESSION: 1. Early subacute infarct centered within the right corona radiata/caudate body, slightly increased in size from MRI 07/11/2019. No significant mass effect. No evidence of hemorrhage at this site. 2. No new acute infarct demonstrated elsewhere within the brain. 3. Stable background of moderate chronic small vessel ischemic disease. Electronically Signed   By: Kellie Simmering DO   On: 07/13/2019 12:13   DG Swallowing Func-Speech Pathology  Result Date: 07/13/2019 Objective Swallowing Evaluation: Type of Study: MBS-Modified Barium Swallow Study  Patient Details Name: SHAMEIKA GERMANI MRN: [id number] Date of Birth: 1940-07-03 Today's Date: 07/13/2019 Time: SLP Start Time (ACUTE ONLY): 0900 -SLP Stop Time (ACUTE ONLY): 0915 SLP Time Calculation (min) (ACUTE ONLY): 15 min Past Medical History: Past Medical History: Diagnosis Date . Arthritis   knees . Depression  . Fatty liver  . GERD (gastroesophageal reflux disease)  . Hyperlipidemia  . Hypertension  . IBS (irritable bowel syndrome)  . Obesity  . Pneumonia  . Sleep apnea   uses CPAP Past Surgical History: Past Surgical History: Procedure Laterality Date . BLADDER SURGERY   . BREAST SURGERY    breast biopsy . BROW LIFT Bilateral 04/14/2017  Procedure: BLEPHAROPLASTY UPPER EYELID WITH EXCESS SKIN;  Surgeon: Karle Starch, MD;  Location: [place];  Service: Ophthalmology;  Laterality: Bilateral; . CATARACT EXTRACTION W/PHACO Left 02/05/2016  Procedure: CATARACT EXTRACTION PHACO AND INTRAOCULAR LENS PLACEMENT (IOC);  Surgeon: Birder Robson, MD;  Location: [facility];  Service: Ophthalmology;   Laterality: Left;  [place] 01:10 AP% 22.3 CDE 15.67 Fluid pack lot # [id number] . CATARACT EXTRACTION W/PHACO Right 02/26/2016  Procedure: CATARACT EXTRACTION PHACO AND INTRAOCULAR LENS PLACEMENT (IOC);  Surgeon: Birder Robson, MD;  Location: [facility];  Service: Ophthalmology;  Laterality: Right;  [place] 57.4 AP% 24.0 CDE 13.75 Fluid Pack lot # [id number] . CHOLECYSTECTOMY   . COLONOSCOPY WITH PROPOFOL N/A 12/18/2014  Procedure: COLONOSCOPY WITH PROPOFOL;  Surgeon: Manya Silvas, MD;  Location: [facility];  Service: Endoscopy;  Laterality: N/A; . DEEP NECK LYMPH NODE BIOPSY / EXCISION   . JOINT REPLACEMENT   . KNEE ARTHROPLASTY Left 06/20/2015  Procedure: COMPUTER ASSISTED TOTAL KNEE ARTHROPLASTY;  Surgeon: Dereck Leep, MD;  Location: [facility];  Service: Orthopedics;  Laterality: Left; . PTOSIS REPAIR Bilateral 04/14/2017  Procedure: PTOSIS REPAIR RESECT EX;  Surgeon: Karle Starch, MD;  Location: [place];  Service: Ophthalmology;  Laterality: Bilateral;  sleep apnea . TONSILLECTOMY   . TOTAL HIP ARTHROPLASTY Right 07/21/2018  Procedure: TOTAL HIP ARTHROPLASTY ANTERIOR APPROACH;  Surgeon: Gaynelle Arabian, MD;  Location: [facility];  Service: Orthopedics;  Laterality: Right; HPI: 79 y.o. female  with history of obesity, hypertension, hyperlipidemia, Lt sciatic nerve pain. Presented to ED 07/11/19 with a facial droop and dysarthria. 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 CXR lungsl clear. Drooling, coughing with thin and prolonged mastication per RN.  No data recorded Assessment / Plan / Recommendation CHL IP CLINICAL IMPRESSIONS 07/13/2019  Clinical Impression Minimal oropharyngeal dysphagia demonstrated and suspected esophageal involvement. There were two instances of thin barium entering laryngeal vestibule prior to full epiglottic deflection however penetrates appeared to be ejected fully during the swallow. Material observed entering upper esophageal sphincter with mild  hesitation to transit x 1 with eructation. Elevation of larynx was decreased intermittently therefore pill lodged in valleculae with thin liquid trials and transited with additional sips thin. MBS does not diagnose below the level of the  UES however esophageal scan revealed retention (mild) in mid esophagus that did not clear at end of study. She may benefit from GI consult. Recommend she continue regular texture, thin liquids, cups preferred, pills whole in puree, remain upright after meals, small bites/sips, alternate liquids and solids and refrain from verbalizing after swallows. Will follow to reiterate strategies and provide further feedback.        SLP Visit Diagnosis Dysphagia, pharyngoesophageal phase (R13.14) Attention and concentration deficit following -- Frontal lobe and executive function deficit following -- Impact on safety and function Mild aspiration risk   CHL IP TREATMENT RECOMMENDATION 07/13/2019 Treatment Recommendations Therapy as outlined in treatment plan below   Prognosis 07/13/2019 Prognosis for Safe Diet Advancement Good Barriers to Reach Goals -- Barriers/Prognosis Comment -- CHL IP DIET RECOMMENDATION 07/13/2019 SLP Diet Recommendations Regular solids;Thin liquid Liquid Administration via Cup Medication Administration Whole meds with puree Compensations Minimize environmental distractions;Slow rate;Small sips/bites Postural Changes Remain semi-upright after after feeds/meals (Comment);Seated upright at 90 degrees   CHL IP OTHER RECOMMENDATIONS 07/13/2019 Recommended Consults -- Oral Care Recommendations Oral care BID Other Recommendations --   CHL IP FOLLOW UP RECOMMENDATIONS 07/13/2019 Follow up Recommendations Inpatient Rehab   CHL IP FREQUENCY AND DURATION 07/13/2019 Speech Therapy Frequency (ACUTE ONLY) min 2x/week Treatment Duration 2 weeks      CHL IP ORAL PHASE 07/13/2019 Oral Phase Impaired Oral - Pudding Teaspoon -- Oral - Pudding Cup -- Oral - Honey Teaspoon -- Oral - Honey Cup --  Oral - Nectar Teaspoon -- Oral - Nectar Cup -- Oral - Nectar Straw -- Oral - Thin Teaspoon -- Oral - Thin Cup WFL Oral - Thin Straw WFL Oral - Puree -- Oral - Mech Soft -- Oral - Regular Delayed oral transit Oral - Multi-Consistency -- Oral - Pill WFL Oral Phase - Comment --  CHL IP PHARYNGEAL PHASE 07/13/2019 Pharyngeal Phase Impaired Pharyngeal- Pudding Teaspoon -- Pharyngeal -- Pharyngeal- Pudding Cup -- Pharyngeal -- Pharyngeal- Honey Teaspoon -- Pharyngeal -- Pharyngeal- Honey Cup -- Pharyngeal -- Pharyngeal- Nectar Teaspoon -- Pharyngeal -- Pharyngeal- Nectar Cup -- Pharyngeal -- Pharyngeal- Nectar Straw -- Pharyngeal -- Pharyngeal- Thin Teaspoon -- Pharyngeal -- Pharyngeal- Thin Cup Penetration/Aspiration during swallow Pharyngeal Material enters airway, remains ABOVE vocal cords then ejected out Pharyngeal- Thin Straw Penetration/Aspiration during swallow Pharyngeal Material enters airway, remains ABOVE vocal cords then ejected out Pharyngeal- Puree -- Pharyngeal -- Pharyngeal- Mechanical Soft -- Pharyngeal -- Pharyngeal- Regular WFL Pharyngeal -- Pharyngeal- Multi-consistency -- Pharyngeal -- Pharyngeal- Pill Pharyngeal residue - valleculae;Reduced anterior laryngeal mobility Pharyngeal -- Pharyngeal Comment --  CHL IP CERVICAL ESOPHAGEAL PHASE 07/13/2019 Cervical Esophageal Phase (No Data) Pudding Teaspoon -- Pudding Cup -- Honey Teaspoon -- Honey Cup -- Nectar Teaspoon -- Nectar Cup -- Nectar Straw -- Thin Teaspoon -- Thin Cup -- Thin Straw -- Puree -- Mechanical Soft -- Regular -- Multi-consistency -- Pill -- Cervical Esophageal Comment -- [place] Siren 07/13/2019, 9:56 AM Orbie Pyo Colvin Caroli.Ed Risk analyst [phone] Office [phone]  PHYSICAL EXAM Pleasant mildly obese elderly Caucasian lady not in distress. . Afebrile. Head is nontraumatic. Neck is supple without bruit.    Cardiac exam no murmur or gallop. Lungs are clear to auscultation. Distal pulses  are well felt. Neurological Exam ;  Awake  Alert oriented x 3.  Mildly dysarthric speech.  No aphasia.  Follows commands well.  Eye movements full without nystagmus.fundi were not visualized. Vision acuity and fields appear normal. Hearing is normal. Palatal movements are normal.  Mild left lower facial weakness.  Tongue midline.Normal strength, tone, reflexes and coordination.  Diminished fine finger movements on the left.  Mild left grip weakness.  Orbits right over left upper extremity.  Normal sensation. Gait deferred. NIH stroke scale 2.  Premorbid modified Rankin 1 ASSESSMENT/PLAN Ms. LORAINNE MCKINZEY is a 79 y.o. female with history of HTN, HLD, obesity presenting with dysarthria, L facial droop, mild L hemiparesis.   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   MRI  R corona radiata and caudate head small vessel disease. infarcts  CTA head & neck pending   2D Echo pending   LDL 130  HgbA1c 5.5  Lovenox 40 mg sq daily for VTE prophylaxis  No antithrombotic prior to admission, now on clopidogrel 75 mg daily and aspirin 324 mg. Change aspirin to 81. Continue DAPT x 3 weeks then aspirin alone.   Therapy recommendations: CLR  Disposition: CLR  Patient interested in participating in the [employer] stroke trial. [facility] will follow up for possible enrollment. Please contact them at [phone] for any questions.    Hypertensive Urgency  BP elevated on arrival 202/110  BP now 170s, Stable . Permissive hypertension (OK if < 220/120) but gradually normalize in 5-7 days . Long-term BP goal normotensive  Hyperlipidemia  Home meds:  No statin  Now on lipitor 80  LDL 130, goal < 70  Continue statin at discharge  Other Stroke Risk Factors  Advanced age  ETOH use, alcohol level <10, advised to drink no more than 1 drink(s) a day  Obesity, Body mass index is 35.74 kg/m.,  recommend weight loss, diet and exercise as appropriate. Additional info requested from RN  Obstructive sleep apnea, on CPAP at home  Pt/dtr reports hx imaging evidence of small AAA    Other Active Problems  Chronic back pain  Hospital day # 2  I have personally obtained history,examined this patient, reviewed notes, independently viewed imaging studies, participated in medical decision making and plan of care.ROS completed by me personally and pertinent positives fully documented  I have made any additions or clarifications directly to the above note.  She presented with left-sided facial weakness and left leg weakness secondary to right basal ganglia infarct likely from small vessel disease.  Continue ongoing stroke work-up and therapy evaluation.  Recommend aspirin and Plavix for 3 weeks followed by aspirin alone. She is  participating in the [employer] stroke prevention trial( standard of care antiplatelets and Factor xi inhibitor versus standard of care antiplates and placebo)     Greater than 50% time during this 25-minute visit was spent on counseling and coordination of care about stroke and discussion about stroke prevention and treatment and answering questions. Antony Contras, MD Medical Director [facility] Pager: [phone] 07/13/2019 4:34 PM   To contact Stroke Continuity provider, please refer to [email or web address] After hours, contact General Neurology

## 2019-07-13 NOTE — Plan of Care (Signed)

## 2019-07-13 NOTE — Progress Notes (Signed)
  Speech Language Pathology  Patient Details Name: [name] [name] MRN: [id number] DOB: 14-Feb-1941 Today's Date: 07/13/2019 Time:  -      MBS scheduled for 9:00 today                   GO                Houston Siren 07/13/2019, 7:55 AM Orbie Pyo Colvin Caroli.Ed Risk analyst [phone] Office [phone]

## 2019-07-13 NOTE — Progress Notes (Signed)
Echo attempted at 11am, patient in MRI. Will re-attempt later. [name] [name]

## 2019-07-13 NOTE — Progress Notes (Signed)
Inpatient Rehabilitation Admissions Coordinator  I met with patient an and her daughter at bedside for rehab assessment. We discussed goals and expectations of a possible inpt rehab admit. They are in agreement. I have begun insurance authorization with [facility] for a possible admit. I will follow up tomorrow.  Danne Baxter, RN, MSN Rehab Admissions Coordinator [phone] 07/13/2019 6:07 PM

## 2019-07-13 NOTE — Progress Notes (Signed)
Echocardiogram 2D Echocardiogram has been performed.  Oneal Deputy Esteban Kobashigawa 07/13/2019, 3:08 PM

## 2019-07-14 LAB — COMPREHENSIVE METABOLIC PANEL
ALT: 16 U/L (ref 0–44)
AST: 24 U/L (ref 15–41)
Albumin: 3.7 g/dL (ref 3.5–5.0)
Alkaline Phosphatase: 78 U/L (ref 38–126)
Anion gap: 10 (ref 5–15)
BUN: 12 mg/dL (ref 8–23)
CO2: 25 mmol/L (ref 22–32)
Calcium: 9.6 mg/dL (ref 8.9–10.3)
Chloride: 104 mmol/L (ref 98–111)
Creatinine, Ser: 0.91 mg/dL (ref 0.44–1.00)
GFR calc Af Amer: >60 mL/min (ref >60)
GFR calc non Af Amer: >60 mL/min (ref >60)
Glucose, Bld: 113 mg/dL — ABNORMAL HIGH (ref 70–99)
Potassium: 3.7 mmol/L (ref 3.5–5.1)
Sodium: 139 mmol/L (ref 135–145)
Total Bilirubin: 0.9 mg/dL (ref 0.3–1.2)
Total Protein: 6.2 g/dL — ABNORMAL LOW (ref 6.5–8.1)

## 2019-07-14 LAB — CBC WITH DIFFERENTIAL/PLATELET
Abs Immature Granulocytes: 0.05 10*3/uL (ref 0.00–0.07)
Basophils Absolute: 0.1 10*3/uL (ref 0.0–0.1)
Basophils Relative: 1 %
Eosinophils Absolute: 0.2 10*3/uL (ref 0.0–0.5)
Eosinophils Relative: 3 %
HCT: 45.7 % (ref 36.0–46.0)
Hemoglobin: 15.1 g/dL — ABNORMAL HIGH (ref 12.0–15.0)
Immature Granulocytes: 1 %
Lymphocytes Relative: 26 %
Lymphs Abs: 1.7 10*3/uL (ref 0.7–4.0)
MCH: 31.1 pg (ref 26.0–34.0)
MCHC: 33 g/dL (ref 30.0–36.0)
MCV: 94 fL (ref 80.0–100.0)
Monocytes Absolute: 0.7 10*3/uL (ref 0.1–1.0)
Monocytes Relative: 10 %
Neutro Abs: 4 10*3/uL (ref 1.7–7.7)
Neutrophils Relative %: 59 %
Platelets: 236 10*3/uL (ref 150–400)
RBC: 4.86 MIL/uL (ref 3.87–5.11)
RDW: 12.5 % (ref 11.5–15.5)
WBC: 6.7 10*3/uL (ref 4.0–10.5)
nRBC: 0 % (ref 0.0–0.2)

## 2019-07-14 LAB — MAGNESIUM: Magnesium: 2.2 mg/dL (ref 1.7–2.4)

## 2019-07-14 MED ORDER — PROCHLORPERAZINE MALEATE 5 MG PO TABS
5.0000 mg | ORAL_TABLET | Freq: Four times a day (QID) | ORAL | Status: DC | PRN
  Filled 2019-07-14: qty 1

## 2019-07-14 MED ORDER — ATENOLOL 25 MG PO TABS
25.0000 mg | ORAL_TABLET | Freq: Every day | ORAL | Status: DC
  Administered 2019-07-14 – 2019-07-15 (×2): 25 mg via ORAL
  Filled 2019-07-14 (×2): qty 1

## 2019-07-14 NOTE — Progress Notes (Addendum)
[name] Kitchen  PROGRESS NOTE    [name] [name]  [id number] DOB: [birth year] DOA: 07/11/2019 PCP: Tonia Ghent, MD   Brief Narrative:   Brief Narrative:   [name] a78 y.o.femalewith medical history significant ofhypertension, hyperlipidemia, chronic back pain, sleep apneaon CPAP at nightwho presents to the emergencydepartmentfrom home with complaint of weaknessof bilateral lower extremities, slurred speech.MRI showed acute small vessel infarction involving coronary radiata and caudate. Neurology wasconsulted.  07/14/19: No acute events ON. Adding back atenolol at lower dose. Gradually increase to home dosing. Awaiting approval from insurance for CIR. Lab work looks good. Updated dtr by phone.   Assessment & Plan:   Principal Problem:   Ischemic stroke ([place]) Active Problems:   HYPERCHOLESTEROLEMIA   Essential hypertension   Acute CVA (cerebrovascular accident) ([place])   Mixed hyperlipidemia   OSA (obstructive sleep apnea)  Acute CVA     - CT head: No acute intracranial hemorrhage, mass effect, or evidence of acute infarction.     - MRI brain: Acute small vessel infarcts involving the right corona radiata and caudate.     - CAT H/N: Positive for intracranial atherosclerosis with significant stenoses:- moderate to severe Left ICA anterior genu. - moderate bilateral ACA distal A2. - moderate Left PCA P1.     - Neurology onboard     - Echocardiogram results pending      - HDL: 33, LDL 130, Tchol 222     - A1c: 5.5      - DAPT, statin     - SLP: reg, thin liquid     - 07/14/19: PT/OT/SLP: rec CIR; they have eval'd; awaiting insurance approval     - Neuro has s/o'd     - DAPT x 3 weeks, then ASA thereafter     - echo: Left Ventricle: Left ventricular ejection fraction, by estimation, is 50 to 55%. The left ventricle has low normal function. The left ventricle has no regional wall motion abnormalities. The left ventricular internal  cavity size was normal in size. There is  no left ventricular hypertrophy. Left ventricular diastolic parameters are indeterminate. No intracardiac source of embolism detected on this transthoracic study.  Hypertension     - Allow permissive hypertension through today     - 07/14/19: resuming atenolol at lower dose, gradually increase to home dose  Hyperlipidemia     - LDL 130     - Lipitor  OSA     - CPAP nightly  DVT prophylaxis: lovenos Code Status: FULL   Disposition Plan: To CIR when approved by insurance  Consultants:   Neurology   ROS:  C/o headache. Denies CP, N, V. Remainder 10-pt ROS is negative for all not previously mentioned.  Subjective: "I feel like it's not going to get better."  Objective: Vitals:   07/13/19 0740 07/13/19 1621 07/13/19 2134 07/14/19 0746  BP: (!) 156/104 (!) 156/80 (!) 150/86 (!) 160/75  Pulse: 94 85 95 (!) 109  Resp: 20 19 19    Temp: 98.4 F (36.9 C) 98.3 F (36.8 C) 98.6 F (37 C) 98.2 F (36.8 C)  TempSrc:   Oral   SpO2: 94% 95% 95% 94%  Weight:      Height:       No intake or output data in the 24 hours ending 07/14/19 1535 Filed Weights   07/11/19 2241  Weight: 83 kg    Examination:  General: 79 y.o. female resting in bed in NAD Cardiovascular: RRR, +S1, S2, no m/g/r Respiratory: CTABL,  no w/r/r, normal WOB GI: BS+, NDNT, no masses noted, soft MSK: No e/c/c Neuro: Alert to name, follows commands Psyc: Appropriate interaction and affect, calm/cooperative   Data Reviewed: I have personally reviewed following labs and imaging studies.  CBC: Recent Labs  Lab 07/11/19 1100 07/12/19 0144 07/14/19 0233  WBC 8.9 8.1 6.7  NEUTROABS 6.5  --  4.0  HGB 16.4* 14.8 15.1*  HCT 49.5* 44.6 45.7  MCV 96.1 94.3 94.0  PLT 260 235 [id number]   Basic Metabolic Panel: Recent Labs  Lab 07/11/19 1100 07/12/19 0144 07/14/19 0233  NA 140 138 139  K 4.4 3.9 3.7  CL 104 103 104  CO2 24 25 25   GLUCOSE 102* 101* 113*  BUN 13 12 12   CREATININE 0.93 0.93 0.91  CALCIUM  10.1 9.5 9.6  MG  --   --  2.2   GFR: Estimated Creatinine Clearance: 50.2 mL/min (by C-G formula based on SCr of 0.91 mg/dL). Liver Function Tests: Recent Labs  Lab 07/11/19 1100 07/14/19 0233  AST 25 24  ALT 16 16  ALKPHOS 101 78  BILITOT 0.8 0.9  PROT 6.8 6.2*  ALBUMIN 4.0 3.7   No results for input(s): LIPASE, AMYLASE in the last 168 hours. No results for input(s): AMMONIA in the last 168 hours. Coagulation Profile: Recent Labs  Lab 07/11/19 1100  INR 0.9   Cardiac Enzymes: No results for input(s): CKTOTAL, CKMB, CKMBINDEX, TROPONINI in the last 168 hours. BNP (last 3 results) No results for input(s): PROBNP in the last 8760 hours. HbA1C: No results for input(s): HGBA1C in the last 72 hours. CBG: No results for input(s): GLUCAP in the last 168 hours. Lipid Profile: Recent Labs    07/12/19 0144  CHOL 222*  HDL 33*  LDLCALC 130*  TRIG 295*  CHOLHDL 6.7   Thyroid Function Tests: No results for input(s): TSH, T4TOTAL, FREET4, T3FREE, THYROIDAB in the last 72 hours. Anemia Panel: No results for input(s): VITAMINB12, FOLATE, FERRITIN, TIBC, IRON, RETICCTPCT in the last 72 hours. Sepsis Labs: No results for input(s): PROCALCITON, LATICACIDVEN in the last 168 hours.  Recent Results (from the past 240 hour(s))  SARS CORONAVIRUS 2 (TAT 6-24 HRS) Nasopharyngeal Nasopharyngeal Swab     Status: None   Collection Time: 07/11/19  5:40 PM   Specimen: Nasopharyngeal Swab  Result Value Ref Range Status   SARS Coronavirus 2 NEGATIVE NEGATIVE Final    Comment: (NOTE) SARS-CoV-2 target nucleic acids are NOT DETECTED. The SARS-CoV-2 RNA is generally detectable in upper and lower respiratory specimens during the acute phase of infection. Negative results do not preclude SARS-CoV-2 infection, do not rule out co-infections with other pathogens, and should not be used as the sole basis for treatment or other patient management decisions. Negative results must be combined with  clinical observations, patient history, and epidemiological information. The expected result is Negative. Fact Sheet for Patients: SugarRoll.be Fact Sheet for Healthcare Providers: https://www.woods-mathews.com/ This test is not yet approved or cleared by the [place] FDA and  has been authorized for detection and/or diagnosis of SARS-CoV-2 by FDA under an Emergency Use Authorization (EUA). This EUA will remain  in effect (meaning this test can be used) for the duration of the COVID-19 declaration under Section 56 4(b)(1) of the Act, 21 U.S.C. section 360bbb-3(b)(1), unless the authorization is terminated or revoked sooner. Performed at [facility], [place] [address], [place], [place] [address]       Radiology Studies: CT ANGIO HEAD W OR WO  CONTRAST  Result Date: 07/12/2019 CLINICAL DATA:  79 year old female with acute lacunar 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 EXAM: CT ANGIOGRAPHY HEAD AND NECK TECHNIQUE: Multidetector CT imaging of the head and neck was performed using the standard protocol during bolus administration of intravenous contrast. Multiplanar CT image reconstructions and MIPs were obtained to evaluate the vascular anatomy. Carotid stenosis measurements (when applicable) are obtained utilizing NASCET criteria, using the distal internal carotid diameter as the denominator. CONTRAST:  116mL OMNIPAQUE IOHEXOL 350 MG/ML SOLN COMPARISON:  Brain MRI and head CT 07/11/2019. FINDINGS: CT HEAD Brain: Subtle hypodensity in the right corona radiata corresponding to the diffusion abnormality with no intracranial hemorrhage or mass effect. Stable gray-white matter differentiation elsewhere. No ventriculomegaly. Calvarium and skull base: No acute osseous abnormality identified. Paranasal sinuses: Visualized paranasal sinuses and mastoids are stable and well pneumatized. Orbits: No acute orbit or scalp soft tissue  finding. Small dystrophic calcifications in the right parotid gland. CTA NECK Skeleton: Cervical spine degeneration including mild C3-C4 spondylolisthesis. No acute osseous abnormality identified. Upper chest: Negative. Other neck: No acute soft tissue findings in the neck. Right parotid gland sialolithiasis again noted. Aortic arch: 3 vessel arch configuration with minimal arch atherosclerosis. Right carotid system: Mildly tortuous brachiocephalic artery and proximal right CCA without plaque or stenosis. Negative right carotid bifurcation. Tortuous cervical right ICA without plaque or stenosis. Left carotid system: Tortuous without plaque or stenosis. Vertebral arteries: Minimal plaque in the proximal right subclavian artery without stenosis. Normal right vertebral artery origin. Tortuous right V1 and V2 segments. The right vertebral is patent to the skull base without plaque or stenosis. Mild plaque in the proximal left subclavian artery without stenosis. Tortuosity of the vessel with a kinked appearance at the thoracic inlet. Normal left vertebral artery origin. Tortuous left V1 and V2. Patent left vertebral to the skull base without plaque or stenosis. CTA HEAD Posterior circulation: Mild bilateral V4 segment plaque without stenosis. Patent PICA origins and vertebrobasilar junction. Patent basilar artery which is mildly tapered to the basilar tip (series 15, image 23). SCA and PCA origins remain patent. Posterior communicating arteries are diminutive or absent. Moderate irregularity and stenosis at the left P1/P2 junction on series 14, image 21. Contralateral mild right P2 and P3 stenosis. Anterior circulation: Both ICA siphons are patent. On the left there is moderate calcified plaque and moderate to severe stenosis at the anterior genu (series 13, image 116). Normal left ophthalmic artery origin. On the right similar bulky calcified plaque but no significant right siphon stenosis. Normal right ophthalmic  artery origin. Patent carotid termini. Normal MCA and ACA origins. Dominant right A1. Anterior communicating artery and proximal ACA branches are normal. There is bilateral distal A2 moderate stenosis as seen on series 16, image 23 and series 15, image 17. Left MCA M1 segment and bifurcation are patent without stenosis. Left MCA branches are within normal limits. Right MCA M1 segment and bifurcation are patent without stenosis. Right MCA branches are within normal limits. Venous sinuses: Patent. Anatomic variants: Dominant right ACA A1. Review of the MIP images confirms the above findings IMPRESSION: 1. Negative for large vessel occlusion, and no atherosclerosis or stenosis in the neck. 2. Positive for intracranial atherosclerosis with significant stenoses: - moderate to severe Left ICA anterior genu. - moderate bilateral ACA distal A2. - moderate Left PCA P1. 3. Subtle CT evidence of the acute right lacunar infarcts seen by MRI yesterday. No new intracranial abnormality. 4. Right parotid gland sialolithiasis.  Electronically Signed   By: Genevie Ann M.D.   On: 07/12/2019 20:09   CT ANGIO NECK W OR WO CONTRAST  Result Date: 07/12/2019 CLINICAL DATA:  79 year old female with acute lacunar 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 EXAM: CT ANGIOGRAPHY HEAD AND NECK TECHNIQUE: Multidetector CT imaging of the head and neck was performed using the standard protocol during bolus administration of intravenous contrast. Multiplanar CT image reconstructions and MIPs were obtained to evaluate the vascular anatomy. Carotid stenosis measurements (when applicable) are obtained utilizing NASCET criteria, using the distal internal carotid diameter as the denominator. CONTRAST:  112mL OMNIPAQUE IOHEXOL 350 MG/ML SOLN COMPARISON:  Brain MRI and head CT 07/11/2019. FINDINGS: CT HEAD Brain: Subtle hypodensity in the right corona radiata corresponding to the diffusion abnormality with no intracranial hemorrhage  or mass effect. Stable gray-white matter differentiation elsewhere. No ventriculomegaly. Calvarium and skull base: No acute osseous abnormality identified. Paranasal sinuses: Visualized paranasal sinuses and mastoids are stable and well pneumatized. Orbits: No acute orbit or scalp soft tissue finding. Small dystrophic calcifications in the right parotid gland. CTA NECK Skeleton: Cervical spine degeneration including mild C3-C4 spondylolisthesis. No acute osseous abnormality identified. Upper chest: Negative. Other neck: No acute soft tissue findings in the neck. Right parotid gland sialolithiasis again noted. Aortic arch: 3 vessel arch configuration with minimal arch atherosclerosis. Right carotid system: Mildly tortuous brachiocephalic artery and proximal right CCA without plaque or stenosis. Negative right carotid bifurcation. Tortuous cervical right ICA without plaque or stenosis. Left carotid system: Tortuous without plaque or stenosis. Vertebral arteries: Minimal plaque in the proximal right subclavian artery without stenosis. Normal right vertebral artery origin. Tortuous right V1 and V2 segments. The right vertebral is patent to the skull base without plaque or stenosis. Mild plaque in the proximal left subclavian artery without stenosis. Tortuosity of the vessel with a kinked appearance at the thoracic inlet. Normal left vertebral artery origin. Tortuous left V1 and V2. Patent left vertebral to the skull base without plaque or stenosis. CTA HEAD Posterior circulation: Mild bilateral V4 segment plaque without stenosis. Patent PICA origins and vertebrobasilar junction. Patent basilar artery which is mildly tapered to the basilar tip (series 15, image 23). SCA and PCA origins remain patent. Posterior communicating arteries are diminutive or absent. Moderate irregularity and stenosis at the left P1/P2 junction on series 14, image 21. Contralateral mild right P2 and P3 stenosis. Anterior circulation: Both ICA  siphons are patent. On the left there is moderate calcified plaque and moderate to severe stenosis at the anterior genu (series 13, image 116). Normal left ophthalmic artery origin. On the right similar bulky calcified plaque but no significant right siphon stenosis. Normal right ophthalmic artery origin. Patent carotid termini. Normal MCA and ACA origins. Dominant right A1. Anterior communicating artery and proximal ACA branches are normal. There is bilateral distal A2 moderate stenosis as seen on series 16, image 23 and series 15, image 17. Left MCA M1 segment and bifurcation are patent without stenosis. Left MCA branches are within normal limits. Right MCA M1 segment and bifurcation are patent without stenosis. Right MCA branches are within normal limits. Venous sinuses: Patent. Anatomic variants: Dominant right ACA A1. Review of the MIP images confirms the above findings IMPRESSION: 1. Negative for large vessel occlusion, and no atherosclerosis or stenosis in the neck. 2. Positive for intracranial atherosclerosis with significant stenoses: - moderate to severe Left ICA anterior genu. - moderate bilateral ACA distal A2. - moderate Left PCA P1. 3.  Subtle CT evidence of the acute right lacunar infarcts seen by MRI yesterday. No new intracranial abnormality. 4. Right parotid gland sialolithiasis. Electronically Signed   By: Genevie Ann M.D.   On: 07/12/2019 20:09   MR BRAIN WO CONTRAST  Result Date: 07/13/2019 CLINICAL DATA:  Stroke, follow-up. Additional history provided: [employer] stroke trial protocol, no new symptoms or complaints. EXAM: MRI HEAD WITHOUT CONTRAST TECHNIQUE: Multiplanar, multiecho pulse sequences of the brain and surrounding structures were obtained without intravenous contrast. COMPARISON:  CT angiogram head/neck 07/12/2019, brain MRI 07/11/2019. FINDINGS: Brain: A research protocol brain MRI was performed. The acquired sequences consist of axial diffusion-weighted imaging, an axial T2 FLAIR  sequence, axial SWI sequence, axial T2 weighted sequence and axial T1 weighted sequence. Restricted diffusion consistent with early subacute info centered within the right corona radiata/body of caudate, slightly increased in size as compared to MRI 07/11/2019. Corresponding T2/FLAIR hyperintensity at this site. No evidence of hemorrhage at this site. No new acute infarct is demonstrated elsewhere within the brain. Moderate patchy T2/FLAIR hyperintensity within the cerebral white matter is unchanged from prior examination and nonspecific, but consistent with chronic small vessel ischemic disease. Mild chronic ischemic changes are also present within the pons. Redemonstrated punctate chronic microhemorrhage within the left parietal lobe (series 4, image 66). Cerebral volume is normal for age. There is no evidence of intracranial mass. No midline shift or extra-axial fluid collection. Vascular: Flow voids maintained within the proximal large arterial vessels. Skull and upper cervical spine: No focal marrow lesion. Sinuses/Orbits: Visualized orbits demonstrate no acute abnormality. Minimal ethmoid sinus mucosal thickening. No significant mastoid effusion. IMPRESSION: 1. Early subacute infarct centered within the right corona radiata/caudate body, slightly increased in size from MRI 07/11/2019. No significant mass effect. No evidence of hemorrhage at this site. 2. No new acute infarct demonstrated elsewhere within the brain. 3. Stable background of moderate chronic small vessel ischemic disease. Electronically Signed   By: Kellie Simmering DO   On: 07/13/2019 12:13   DG Swallowing Func-Speech Pathology  Result Date: 07/13/2019 Objective Swallowing Evaluation: Type of Study: MBS-Modified Barium Swallow Study  Patient Details Name: [name] [name] MRN: [id number] Date of Birth: 03-04-1941 Today's Date: 07/13/2019 Time: SLP Start Time (ACUTE ONLY): 0900 -SLP Stop Time (ACUTE ONLY): 0915 SLP Time Calculation (min) (ACUTE ONLY):  15 min Past Medical History: Past Medical History: Diagnosis Date . Arthritis   knees . Depression  . Fatty liver  . GERD (gastroesophageal reflux disease)  . Hyperlipidemia  . Hypertension  . IBS (irritable bowel syndrome)  . Obesity  . Pneumonia  . Sleep apnea   uses CPAP Past Surgical History: Past Surgical History: Procedure Laterality Date . BLADDER SURGERY   . BREAST SURGERY    breast biopsy . BROW LIFT Bilateral 04/14/2017  Procedure: BLEPHAROPLASTY UPPER EYELID WITH EXCESS SKIN;  Surgeon: Karle Starch, MD;  Location: [place];  Service: Ophthalmology;  Laterality: Bilateral; . CATARACT EXTRACTION W/PHACO Left 02/05/2016  Procedure: CATARACT EXTRACTION PHACO AND INTRAOCULAR LENS PLACEMENT (IOC);  Surgeon: Birder Robson, MD;  Location: [facility];  Service: Ophthalmology;  Laterality: Left;  [place] 01:10 AP% 22.3 CDE 15.67 Fluid pack lot # [id number] . CATARACT EXTRACTION W/PHACO Right 02/26/2016  Procedure: CATARACT EXTRACTION PHACO AND INTRAOCULAR LENS PLACEMENT (IOC);  Surgeon: Birder Robson, MD;  Location: [facility];  Service: Ophthalmology;  Laterality: Right;  [place] 57.4 AP% 24.0 CDE 13.75 Fluid Pack lot # [id number] . CHOLECYSTECTOMY   . COLONOSCOPY WITH PROPOFOL N/A 12/18/2014  Procedure: COLONOSCOPY WITH PROPOFOL;  Surgeon: Manya Silvas, MD;  Location: [facility];  Service: Endoscopy;  Laterality: N/A; . DEEP NECK LYMPH NODE BIOPSY / EXCISION   . JOINT REPLACEMENT   . KNEE ARTHROPLASTY Left 06/20/2015  Procedure: COMPUTER ASSISTED TOTAL KNEE ARTHROPLASTY;  Surgeon: Dereck Leep, MD;  Location: [facility];  Service: Orthopedics;  Laterality: Left; . PTOSIS REPAIR Bilateral 04/14/2017  Procedure: PTOSIS REPAIR RESECT EX;  Surgeon: Karle Starch, MD;  Location: [place];  Service: Ophthalmology;  Laterality: Bilateral;  sleep apnea . TONSILLECTOMY   . TOTAL HIP ARTHROPLASTY Right 07/21/2018  Procedure: TOTAL HIP ARTHROPLASTY ANTERIOR APPROACH;  Surgeon: Gaynelle Arabian, MD;  Location: [facility];  Service: Orthopedics;  Laterality: Right; HPI: 79 y.o. female  with history of obesity, hypertension, hyperlipidemia, Lt sciatic nerve pain. Presented to ED 07/11/19 with a facial droop and dysarthria. 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 CXR lungsl clear. Drooling, coughing with thin and prolonged mastication per RN.  No data recorded Assessment / Plan / Recommendation CHL IP CLINICAL IMPRESSIONS 07/13/2019 Clinical Impression Minimal oropharyngeal dysphagia demonstrated and suspected esophageal involvement. There were two instances of thin barium entering laryngeal vestibule prior to full epiglottic deflection however penetrates appeared to be ejected fully during the swallow. Material observed entering upper esophageal sphincter with mild hesitation to transit x 1 with eructation. Elevation of larynx was decreased intermittently therefore pill lodged in valleculae with thin liquid trials and transited with additional sips thin. MBS does not diagnose below the level of the  UES however esophageal scan revealed retention (mild) in mid esophagus that did not clear at end of study. She may benefit from GI consult. Recommend she continue regular texture, thin liquids, cups preferred, pills whole in puree, remain upright after meals, small bites/sips, alternate liquids and solids and refrain from verbalizing after swallows. Will follow to reiterate strategies and provide further feedback.        SLP Visit Diagnosis Dysphagia, pharyngoesophageal phase (R13.14) Attention and concentration deficit following -- Frontal lobe and executive function deficit following -- Impact on safety and function Mild aspiration risk   CHL IP TREATMENT RECOMMENDATION 07/13/2019 Treatment Recommendations Therapy as outlined in treatment plan below   Prognosis 07/13/2019 Prognosis for Safe Diet Advancement Good Barriers to Reach Goals -- Barriers/Prognosis Comment -- CHL IP DIET RECOMMENDATION  07/13/2019 SLP Diet Recommendations Regular solids;Thin liquid Liquid Administration via Cup Medication Administration Whole meds with puree Compensations Minimize environmental distractions;Slow rate;Small sips/bites Postural Changes Remain semi-upright after after feeds/meals (Comment);Seated upright at 90 degrees   CHL IP OTHER RECOMMENDATIONS 07/13/2019 Recommended Consults -- Oral Care Recommendations Oral care BID Other Recommendations --   CHL IP FOLLOW UP RECOMMENDATIONS 07/13/2019 Follow up Recommendations Inpatient Rehab   CHL IP FREQUENCY AND DURATION 07/13/2019 Speech Therapy Frequency (ACUTE ONLY) min 2x/week Treatment Duration 2 weeks      CHL IP ORAL PHASE 07/13/2019 Oral Phase Impaired Oral - Pudding Teaspoon -- Oral - Pudding Cup -- Oral - Honey Teaspoon -- Oral - Honey Cup -- Oral - Nectar Teaspoon -- Oral - Nectar Cup -- Oral - Nectar Straw -- Oral - Thin Teaspoon -- Oral - Thin Cup WFL Oral - Thin Straw WFL Oral - Puree -- Oral - Mech Soft -- Oral - Regular Delayed oral transit Oral - Multi-Consistency -- Oral - Pill WFL Oral Phase - Comment --  CHL IP PHARYNGEAL PHASE 07/13/2019 Pharyngeal Phase Impaired Pharyngeal- Pudding Teaspoon -- Pharyngeal --  Pharyngeal- Pudding Cup -- Pharyngeal -- Pharyngeal- Honey Teaspoon -- Pharyngeal -- Pharyngeal- Honey Cup -- Pharyngeal -- Pharyngeal- Nectar Teaspoon -- Pharyngeal -- Pharyngeal- Nectar Cup -- Pharyngeal -- Pharyngeal- Nectar Straw -- Pharyngeal -- Pharyngeal- Thin Teaspoon -- Pharyngeal -- Pharyngeal- Thin Cup Penetration/Aspiration during swallow Pharyngeal Material enters airway, remains ABOVE vocal cords then ejected out Pharyngeal- Thin Straw Penetration/Aspiration during swallow Pharyngeal Material enters airway, remains ABOVE vocal cords then ejected out Pharyngeal- Puree -- Pharyngeal -- Pharyngeal- Mechanical Soft -- Pharyngeal -- Pharyngeal- Regular WFL Pharyngeal -- Pharyngeal- Multi-consistency -- Pharyngeal -- Pharyngeal- Pill Pharyngeal  residue - valleculae;Reduced anterior laryngeal mobility Pharyngeal -- Pharyngeal Comment --  CHL IP CERVICAL ESOPHAGEAL PHASE 07/13/2019 Cervical Esophageal Phase (No Data) Pudding Teaspoon -- Pudding Cup -- Honey Teaspoon -- Honey Cup -- Nectar Teaspoon -- Nectar Cup -- Nectar Straw -- Thin Teaspoon -- Thin Cup -- Thin Straw -- Puree -- Mechanical Soft -- Regular -- Multi-consistency -- Pill -- Cervical Esophageal Comment -- [place] Siren 07/13/2019, 9:56 AM Orbie Pyo Colvin Caroli.Ed Actor Pager [phone] Office [phone]              ECHOCARDIOGRAM COMPLETE  Result Date: 07/13/2019    ECHOCARDIOGRAM REPORT   Patient Name:   [name] [name] Date of Exam: 07/13/2019 Medical Rec #:  [id number]      Height:       60.0 in Accession #:    [id number]     Weight:       183.0 lb Date of Birth:  19-Feb-1941      BSA:          1.80 m Patient Age:    44 years       BP:           156/104 mmHg Patient Gender: F              HR:           100 bpm. Exam Location:  Inpatient Procedure: 2D Echo, Color Doppler and Cardiac Doppler Indications:    Stroke i163.9  History:        Patient has no prior history of Echocardiogram examinations.                 Risk Factors:Hypertension, Dyslipidemia and Sleep Apnea.  Sonographer:    Raquel Sarna Senior RDCS Referring Phys: [phone] AMRIT ADHIKARI  Sonographer Comments: Technically difficult study due to poor echo windows. IMPRESSIONS  1. Low normal LVEF without focal wall motion abnormalities, though limited sensitivity given technically difficult study.  2. Left ventricular ejection fraction, by estimation, is 50 to 55%. The left ventricle has low normal function. The left ventricle has no regional wall motion abnormalities. Left ventricular diastolic parameters are indeterminate.  3. Right ventricular systolic function is low normal. The right ventricular size is normal.  4. The mitral valve is normal in structure and function. Trivial mitral valve regurgitation.  No evidence of mitral stenosis.  5. The aortic valve is normal in structure and function. Aortic valve regurgitation is not visualized. No aortic stenosis is present. Conclusion(s)/Recomendation(s): No intracardiac source of embolism detected on this transthoracic study. A transesophageal echocardiogram is recommended to exclude cardiac source of embolism if clinically indicated. FINDINGS  Left Ventricle: Left ventricular ejection fraction, by estimation, is 50 to 55%. The left ventricle has low normal function. The left ventricle has no regional wall motion abnormalities. The left ventricular internal cavity size was normal in size. There is no left ventricular hypertrophy.  Left ventricular diastolic parameters are indeterminate. Right Ventricle: The right ventricular size is normal. No increase in right ventricular wall thickness. Right ventricular systolic function is low normal. Left Atrium: Left atrial size was normal in size. Right Atrium: Right atrial size was normal in size. Pericardium: There is no evidence of pericardial effusion. Mitral Valve: The mitral valve is normal in structure and function. Trivial mitral valve regurgitation. No evidence of mitral valve stenosis. Tricuspid Valve: The tricuspid valve is normal in structure. Tricuspid valve regurgitation is trivial. No evidence of tricuspid stenosis. Aortic Valve: The aortic valve is normal in structure and function. Aortic valve regurgitation is not visualized. No aortic stenosis is present. Pulmonic Valve: The pulmonic valve was not well visualized. Pulmonic valve regurgitation is not visualized. No evidence of pulmonic stenosis. Aorta: The aortic root, ascending aorta and aortic arch are all structurally normal, with no evidence of dilitation or obstruction. Pulmonary Artery: The pulmonary artery is not well seen. IAS/Shunts: The atrial septum is grossly normal.  LEFT VENTRICLE PLAX 2D LVIDd:         3.84 cm  Diastology LVIDs:         3.13 cm  LV  e' lateral:   7.62 cm/s LV PW:         0.97 cm  LV E/e' lateral: 7.1 LV IVS:        1.34 cm  LV e' medial:    4.03 cm/s LVOT diam:     2.10 cm  LV E/e' medial:  13.4 LV SV:         43.64 ml LV SV Index:   12.89 LVOT Area:     3.46 cm  RIGHT VENTRICLE RV S prime:     10.20 cm/s TAPSE (M-mode): 1.4 cm LEFT ATRIUM           Index LA diam:      3.00 cm 1.67 cm/m LA Vol (A2C): 20.2 ml 11.24 ml/m LA Vol (A4C): 33.0 ml 18.36 ml/m  AORTIC VALVE LVOT Vmax:   80.90 cm/s LVOT Vmean:  55.400 cm/s LVOT VTI:    0.126 m  AORTA Ao Root diam: 2.70 cm Ao Asc diam:  3.40 cm MITRAL VALVE MV Area (PHT): 5.84 cm    SHUNTS MV Decel Time: 130 msec    Systemic VTI:  0.13 m MV E velocity: 54.00 cm/s  Systemic Diam: 2.10 cm MV A velocity: 94.30 cm/s MV E/A ratio:  0.57 Buford Dresser MD Electronically signed by Buford Dresser MD Signature Date/Time: 07/13/2019/9:02:49 PM    Final      Scheduled Meds: . aspirin  81 mg Oral Daily  . atenolol  25 mg Oral Daily  . atorvastatin  80 mg Oral q1800  . cholecalciferol  1,000 Units Oral Daily  . clopidogrel  75 mg Oral Daily  . enoxaparin (LOVENOX) injection  40 mg Subcutaneous Q24H  . BAY [id number] or placebo ([place])  1 tablet Oral Daily   And  . BAY [id number] or placebo ([place])  1 tablet Oral Daily   Continuous Infusions:   LOS: 3 days    Time spent: 25 minutes spent in the coordination of care today.    Jonnie Finner, DO [facility]  If 7PM-7AM, please contact night-coverage [email or web address] 07/14/2019, 3:35 PM

## 2019-07-14 NOTE — Progress Notes (Signed)
Inpatient [place] has not made a determination on approval for [facility] yet. I have notified patient with her daughter at bedside. I will follow up tomorrow.  Danne Baxter, RN, MSN Rehab Admissions Coordinator [phone] 07/14/2019 3:14 PM

## 2019-07-14 NOTE — Progress Notes (Signed)
Patient on her home cpap device.

## 2019-07-14 NOTE — Progress Notes (Signed)
Pt reports that her daughter gave her a tums while visiting. RN educated patient on policy to make RN aware of symptoms so we can track medicines taken by patient. Pt verbalized understanding

## 2019-07-14 NOTE — Plan of Care (Signed)

## 2019-07-14 NOTE — Progress Notes (Signed)
STROKE TEAM PROGRESS NOTE   INTERVAL HISTORY She is sitting up comfortably in bed.   She still has some dysarthria and facial asymmetry but is improving.  . Await inpatient rehab bed Vitals:   07/13/19 0740 07/13/19 1621 07/13/19 2134 07/14/19 0746  BP: (!) 156/104 (!) 156/80 (!) 150/86 (!) 160/75  Pulse: 94 85 95 (!) 109  Resp: 20 19 19    Temp: 98.4 F (36.9 C) 98.3 F (36.8 C) 98.6 F (37 C) 98.2 F (36.8 C)  TempSrc:   Oral   SpO2: 94% 95% 95% 94%  Weight:      Height:        CBC:  Recent Labs  Lab 07/11/19 1100 07/11/19 1100 07/12/19 0144 07/14/19 0233  WBC 8.9   < > 8.1 6.7  NEUTROABS 6.5  --   --  4.0  HGB 16.4*   < > 14.8 15.1*  HCT 49.5*   < > 44.6 45.7  MCV 96.1   < > 94.3 94.0  PLT 260   < > 235 236   < > = values in this interval not displayed.    Basic Metabolic Panel:  Recent Labs  Lab 07/12/19 0144 07/14/19 0233  NA 138 139  K 3.9 3.7  CL 103 104  CO2 25 25  GLUCOSE 101* 113*  BUN 12 12  CREATININE 0.93 0.91  CALCIUM 9.5 9.6  MG  --  2.2   Lipid Panel:     Component Value Date/Time   CHOL 222 (H) 07/12/2019 0144   CHOL 189 05/31/2015 1136   TRIG 295 (H) 07/12/2019 0144   HDL 33 (L) 07/12/2019 0144   HDL 38 (L) 05/31/2015 1136   CHOLHDL 6.7 07/12/2019 0144   VLDL 59 (H) 07/12/2019 0144   LDLCALC 130 (H) 07/12/2019 0144   LDLCALC 108 (H) 05/31/2015 1136   HgbA1c:  Lab Results  Component Value Date   HGBA1C 5.5 07/11/2019   Urine Drug Screen:     Component Value Date/Time   LABOPIA NONE DETECTED 07/11/2019 1100   COCAINSCRNUR NONE DETECTED 07/11/2019 1100   LABBENZ NONE DETECTED 07/11/2019 1100   AMPHETMU NONE DETECTED 07/11/2019 1100   THCU NONE DETECTED 07/11/2019 1100   LABBARB NONE DETECTED 07/11/2019 1100    Alcohol Level     Component Value Date/Time   ETH <10 07/11/2019 1100    IMAGING past 48 hours CT ANGIO HEAD W OR WO CONTRAST  Result Date: 07/12/2019 CLINICAL DATA:  79 year old female with acute lacunar  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 EXAM: CT ANGIOGRAPHY HEAD AND NECK TECHNIQUE: Multidetector CT imaging of the head and neck was performed using the standard protocol during bolus administration of intravenous contrast. Multiplanar CT image reconstructions and MIPs were obtained to evaluate the vascular anatomy. Carotid stenosis measurements (when applicable) are obtained utilizing NASCET criteria, using the distal internal carotid diameter as the denominator. CONTRAST:  147mL OMNIPAQUE IOHEXOL 350 MG/ML SOLN COMPARISON:  Brain MRI and head CT 07/11/2019. FINDINGS: CT HEAD Brain: Subtle hypodensity in the right corona radiata corresponding to the diffusion abnormality with no intracranial hemorrhage or mass effect. Stable gray-white matter differentiation elsewhere. No ventriculomegaly. Calvarium and skull base: No acute osseous abnormality identified. Paranasal sinuses: Visualized paranasal sinuses and mastoids are stable and well pneumatized. Orbits: No acute orbit or scalp soft tissue finding. Small dystrophic calcifications in the right parotid gland. CTA NECK Skeleton: Cervical spine degeneration including mild C3-C4 spondylolisthesis. No acute osseous abnormality  identified. Upper chest: Negative. Other neck: No acute soft tissue findings in the neck. Right parotid gland sialolithiasis again noted. Aortic arch: 3 vessel arch configuration with minimal arch atherosclerosis. Right carotid system: Mildly tortuous brachiocephalic artery and proximal right CCA without plaque or stenosis. Negative right carotid bifurcation. Tortuous cervical right ICA without plaque or stenosis. Left carotid system: Tortuous without plaque or stenosis. Vertebral arteries: Minimal plaque in the proximal right subclavian artery without stenosis. Normal right vertebral artery origin. Tortuous right V1 and V2 segments. The right vertebral is patent to the skull base without plaque or stenosis. Mild  plaque in the proximal left subclavian artery without stenosis. Tortuosity of the vessel with a kinked appearance at the thoracic inlet. Normal left vertebral artery origin. Tortuous left V1 and V2. Patent left vertebral to the skull base without plaque or stenosis. CTA HEAD Posterior circulation: Mild bilateral V4 segment plaque without stenosis. Patent PICA origins and vertebrobasilar junction. Patent basilar artery which is mildly tapered to the basilar tip (series 15, image 23). SCA and PCA origins remain patent. Posterior communicating arteries are diminutive or absent. Moderate irregularity and stenosis at the left P1/P2 junction on series 14, image 21. Contralateral mild right P2 and P3 stenosis. Anterior circulation: Both ICA siphons are patent. On the left there is moderate calcified plaque and moderate to severe stenosis at the anterior genu (series 13, image 116). Normal left ophthalmic artery origin. On the right similar bulky calcified plaque but no significant right siphon stenosis. Normal right ophthalmic artery origin. Patent carotid termini. Normal MCA and ACA origins. Dominant right A1. Anterior communicating artery and proximal ACA branches are normal. There is bilateral distal A2 moderate stenosis as seen on series 16, image 23 and series 15, image 17. Left MCA M1 segment and bifurcation are patent without stenosis. Left MCA branches are within normal limits. Right MCA M1 segment and bifurcation are patent without stenosis. Right MCA branches are within normal limits. Venous sinuses: Patent. Anatomic variants: Dominant right ACA A1. Review of the MIP images confirms the above findings IMPRESSION: 1. Negative for large vessel occlusion, and no atherosclerosis or stenosis in the neck. 2. Positive for intracranial atherosclerosis with significant stenoses: - moderate to severe Left ICA anterior genu. - moderate bilateral ACA distal A2. - moderate Left PCA P1. 3. Subtle CT evidence of the acute right  lacunar infarcts seen by MRI yesterday. No new intracranial abnormality. 4. Right parotid gland sialolithiasis. Electronically Signed   By: Genevie Ann M.D.   On: 07/12/2019 20:09   CT ANGIO NECK W OR WO CONTRAST  Result Date: 07/12/2019 CLINICAL DATA:  79 year old female with acute lacunar 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 EXAM: CT ANGIOGRAPHY HEAD AND NECK TECHNIQUE: Multidetector CT imaging of the head and neck was performed using the standard protocol during bolus administration of intravenous contrast. Multiplanar CT image reconstructions and MIPs were obtained to evaluate the vascular anatomy. Carotid stenosis measurements (when applicable) are obtained utilizing NASCET criteria, using the distal internal carotid diameter as the denominator. CONTRAST:  174mL OMNIPAQUE IOHEXOL 350 MG/ML SOLN COMPARISON:  Brain MRI and head CT 07/11/2019. FINDINGS: CT HEAD Brain: Subtle hypodensity in the right corona radiata corresponding to the diffusion abnormality with no intracranial hemorrhage or mass effect. Stable gray-white matter differentiation elsewhere. No ventriculomegaly. Calvarium and skull base: No acute osseous abnormality identified. Paranasal sinuses: Visualized paranasal sinuses and mastoids are stable and well pneumatized. Orbits: No acute orbit or scalp soft tissue finding.  Small dystrophic calcifications in the right parotid gland. CTA NECK Skeleton: Cervical spine degeneration including mild C3-C4 spondylolisthesis. No acute osseous abnormality identified. Upper chest: Negative. Other neck: No acute soft tissue findings in the neck. Right parotid gland sialolithiasis again noted. Aortic arch: 3 vessel arch configuration with minimal arch atherosclerosis. Right carotid system: Mildly tortuous brachiocephalic artery and proximal right CCA without plaque or stenosis. Negative right carotid bifurcation. Tortuous cervical right ICA without plaque or stenosis. Left carotid  system: Tortuous without plaque or stenosis. Vertebral arteries: Minimal plaque in the proximal right subclavian artery without stenosis. Normal right vertebral artery origin. Tortuous right V1 and V2 segments. The right vertebral is patent to the skull base without plaque or stenosis. Mild plaque in the proximal left subclavian artery without stenosis. Tortuosity of the vessel with a kinked appearance at the thoracic inlet. Normal left vertebral artery origin. Tortuous left V1 and V2. Patent left vertebral to the skull base without plaque or stenosis. CTA HEAD Posterior circulation: Mild bilateral V4 segment plaque without stenosis. Patent PICA origins and vertebrobasilar junction. Patent basilar artery which is mildly tapered to the basilar tip (series 15, image 23). SCA and PCA origins remain patent. Posterior communicating arteries are diminutive or absent. Moderate irregularity and stenosis at the left P1/P2 junction on series 14, image 21. Contralateral mild right P2 and P3 stenosis. Anterior circulation: Both ICA siphons are patent. On the left there is moderate calcified plaque and moderate to severe stenosis at the anterior genu (series 13, image 116). Normal left ophthalmic artery origin. On the right similar bulky calcified plaque but no significant right siphon stenosis. Normal right ophthalmic artery origin. Patent carotid termini. Normal MCA and ACA origins. Dominant right A1. Anterior communicating artery and proximal ACA branches are normal. There is bilateral distal A2 moderate stenosis as seen on series 16, image 23 and series 15, image 17. Left MCA M1 segment and bifurcation are patent without stenosis. Left MCA branches are within normal limits. Right MCA M1 segment and bifurcation are patent without stenosis. Right MCA branches are within normal limits. Venous sinuses: Patent. Anatomic variants: Dominant right ACA A1. Review of the MIP images confirms the above findings IMPRESSION: 1. Negative  for large vessel occlusion, and no atherosclerosis or stenosis in the neck. 2. Positive for intracranial atherosclerosis with significant stenoses: - moderate to severe Left ICA anterior genu. - moderate bilateral ACA distal A2. - moderate Left PCA P1. 3. Subtle CT evidence of the acute right lacunar infarcts seen by MRI yesterday. No new intracranial abnormality. 4. Right parotid gland sialolithiasis. Electronically Signed   By: Genevie Ann M.D.   On: 07/12/2019 20:09   MR BRAIN WO CONTRAST  Result Date: 07/13/2019 CLINICAL DATA:  Stroke, follow-up. Additional history provided: [employer] stroke trial protocol, no new symptoms or complaints. EXAM: MRI HEAD WITHOUT CONTRAST TECHNIQUE: Multiplanar, multiecho pulse sequences of the brain and surrounding structures were obtained without intravenous contrast. COMPARISON:  CT angiogram head/neck 07/12/2019, brain MRI 07/11/2019. FINDINGS: Brain: A research protocol brain MRI was performed. The acquired sequences consist of axial diffusion-weighted imaging, an axial T2 FLAIR sequence, axial SWI sequence, axial T2 weighted sequence and axial T1 weighted sequence. Restricted diffusion consistent with early subacute info centered within the right corona radiata/body of caudate, slightly increased in size as compared to MRI 07/11/2019. Corresponding T2/FLAIR hyperintensity at this site. No evidence of hemorrhage at this site. No new acute infarct is demonstrated elsewhere within the brain. Moderate patchy T2/FLAIR hyperintensity within the  cerebral white matter is unchanged from prior examination and nonspecific, but consistent with chronic small vessel ischemic disease. Mild chronic ischemic changes are also present within the pons. Redemonstrated punctate chronic microhemorrhage within the left parietal lobe (series 4, image 66). Cerebral volume is normal for age. There is no evidence of intracranial mass. No midline shift or extra-axial fluid collection. Vascular: Flow  voids maintained within the proximal large arterial vessels. Skull and upper cervical spine: No focal marrow lesion. Sinuses/Orbits: Visualized orbits demonstrate no acute abnormality. Minimal ethmoid sinus mucosal thickening. No significant mastoid effusion. IMPRESSION: 1. Early subacute infarct centered within the right corona radiata/caudate body, slightly increased in size from MRI 07/11/2019. No significant mass effect. No evidence of hemorrhage at this site. 2. No new acute infarct demonstrated elsewhere within the brain. 3. Stable background of moderate chronic small vessel ischemic disease. Electronically Signed   By: Kellie Simmering DO   On: 07/13/2019 12:13   DG Swallowing Func-Speech Pathology  Result Date: 07/13/2019 Objective Swallowing Evaluation: Type of Study: MBS-Modified Barium Swallow Study  Patient Details Name: SAMAURIA GLAAB MRN: [id number] Date of Birth: [birth year] Today's Date: 07/13/2019 Time: SLP Start Time (ACUTE ONLY): 0900 -SLP Stop Time (ACUTE ONLY): 0915 SLP Time Calculation (min) (ACUTE ONLY): 15 min Past Medical History: Past Medical History: Diagnosis Date . Arthritis   knees . Depression  . Fatty liver  . GERD (gastroesophageal reflux disease)  . Hyperlipidemia  . Hypertension  . IBS (irritable bowel syndrome)  . Obesity  . Pneumonia  . Sleep apnea   uses CPAP Past Surgical History: Past Surgical History: Procedure Laterality Date . BLADDER SURGERY   . BREAST SURGERY    breast biopsy . BROW LIFT Bilateral 04/14/2017  Procedure: BLEPHAROPLASTY UPPER EYELID WITH EXCESS SKIN;  Surgeon: Karle Starch, MD;  Location: [facility];  Service: Ophthalmology;  Laterality: Bilateral; . CATARACT EXTRACTION W/PHACO Left 02/05/2016  Procedure: CATARACT EXTRACTION PHACO AND INTRAOCULAR LENS PLACEMENT (IOC);  Surgeon: Birder Robson, MD;  Location: [facility];  Service: Ophthalmology;  Laterality: Left;  [place] 01:10 AP% 22.3 CDE 15.67 Fluid pack lot # [id number] . CATARACT EXTRACTION W/PHACO Right  02/26/2016  Procedure: CATARACT EXTRACTION PHACO AND INTRAOCULAR LENS PLACEMENT (IOC);  Surgeon: Birder Robson, MD;  Location: [facility];  Service: Ophthalmology;  Laterality: Right;  [place] 57.4 AP% 24.0 CDE 13.75 Fluid Pack lot # [id number] . CHOLECYSTECTOMY   . COLONOSCOPY WITH PROPOFOL N/A 12/18/2014  Procedure: COLONOSCOPY WITH PROPOFOL;  Surgeon: Manya Silvas, MD;  Location: [facility];  Service: Endoscopy;  Laterality: N/A; . DEEP NECK LYMPH NODE BIOPSY / EXCISION   . JOINT REPLACEMENT   . KNEE ARTHROPLASTY Left 06/20/2015  Procedure: COMPUTER ASSISTED TOTAL KNEE ARTHROPLASTY;  Surgeon: Dereck Leep, MD;  Location: [facility];  Service: Orthopedics;  Laterality: Left; . PTOSIS REPAIR Bilateral 04/14/2017  Procedure: PTOSIS REPAIR RESECT EX;  Surgeon: Karle Starch, MD;  Location: [facility];  Service: Ophthalmology;  Laterality: Bilateral;  sleep apnea . TONSILLECTOMY   . TOTAL HIP ARTHROPLASTY Right 07/21/2018  Procedure: TOTAL HIP ARTHROPLASTY ANTERIOR APPROACH;  Surgeon: Gaynelle Arabian, MD;  Location: [facility];  Service: Orthopedics;  Laterality: Right; HPI: 79 y.o. female  with history of obesity, hypertension, hyperlipidemia, Lt sciatic nerve pain. Presented to ED 07/11/19 with a facial droop and dysarthria. 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 CXR lungsl clear. Drooling, coughing with thin and prolonged mastication per RN.  No data recorded Assessment /  Plan / Recommendation CHL IP CLINICAL IMPRESSIONS 07/13/2019 Clinical Impression Minimal oropharyngeal dysphagia demonstrated and suspected esophageal involvement. There were two instances of thin barium entering laryngeal vestibule prior to full epiglottic deflection however penetrates appeared to be ejected fully during the swallow. Material observed entering upper esophageal sphincter with mild hesitation to transit x 1 with eructation. Elevation of larynx was decreased intermittently therefore pill  lodged in valleculae with thin liquid trials and transited with additional sips thin. MBS does not diagnose below the level of the  UES however esophageal scan revealed retention (mild) in mid esophagus that did not clear at end of study. She may benefit from GI consult. Recommend she continue regular texture, thin liquids, cups preferred, pills whole in puree, remain upright after meals, small bites/sips, alternate liquids and solids and refrain from verbalizing after swallows. Will follow to reiterate strategies and provide further feedback.        SLP Visit Diagnosis Dysphagia, pharyngoesophageal phase (R13.14) Attention and concentration deficit following -- Frontal lobe and executive function deficit following -- Impact on safety and function Mild aspiration risk   CHL IP TREATMENT RECOMMENDATION 07/13/2019 Treatment Recommendations Therapy as outlined in treatment plan below   Prognosis 07/13/2019 Prognosis for Safe Diet Advancement Good Barriers to Reach Goals -- Barriers/Prognosis Comment -- CHL IP DIET RECOMMENDATION 07/13/2019 SLP Diet Recommendations Regular solids;Thin liquid Liquid Administration via Cup Medication Administration Whole meds with puree Compensations Minimize environmental distractions;Slow rate;Small sips/bites Postural Changes Remain semi-upright after after feeds/meals (Comment);Seated upright at 90 degrees   CHL IP OTHER RECOMMENDATIONS 07/13/2019 Recommended Consults -- Oral Care Recommendations Oral care BID Other Recommendations --   CHL IP FOLLOW UP RECOMMENDATIONS 07/13/2019 Follow up Recommendations Inpatient Rehab   CHL IP FREQUENCY AND DURATION 07/13/2019 Speech Therapy Frequency (ACUTE ONLY) min 2x/week Treatment Duration 2 weeks      CHL IP ORAL PHASE 07/13/2019 Oral Phase Impaired Oral - Pudding Teaspoon -- Oral - Pudding Cup -- Oral - Honey Teaspoon -- Oral - Honey Cup -- Oral - Nectar Teaspoon -- Oral - Nectar Cup -- Oral - Nectar Straw -- Oral - Thin Teaspoon -- Oral - Thin Cup  WFL Oral - Thin Straw WFL Oral - Puree -- Oral - Mech Soft -- Oral - Regular Delayed oral transit Oral - Multi-Consistency -- Oral - Pill WFL Oral Phase - Comment --  CHL IP PHARYNGEAL PHASE 07/13/2019 Pharyngeal Phase Impaired Pharyngeal- Pudding Teaspoon -- Pharyngeal -- Pharyngeal- Pudding Cup -- Pharyngeal -- Pharyngeal- Honey Teaspoon -- Pharyngeal -- Pharyngeal- Honey Cup -- Pharyngeal -- Pharyngeal- Nectar Teaspoon -- Pharyngeal -- Pharyngeal- Nectar Cup -- Pharyngeal -- Pharyngeal- Nectar Straw -- Pharyngeal -- Pharyngeal- Thin Teaspoon -- Pharyngeal -- Pharyngeal- Thin Cup Penetration/Aspiration during swallow Pharyngeal Material enters airway, remains ABOVE vocal cords then ejected out Pharyngeal- Thin Straw Penetration/Aspiration during swallow Pharyngeal Material enters airway, remains ABOVE vocal cords then ejected out Pharyngeal- Puree -- Pharyngeal -- Pharyngeal- Mechanical Soft -- Pharyngeal -- Pharyngeal- Regular WFL Pharyngeal -- Pharyngeal- Multi-consistency -- Pharyngeal -- Pharyngeal- Pill Pharyngeal residue - valleculae;Reduced anterior laryngeal mobility Pharyngeal -- Pharyngeal Comment --  CHL IP CERVICAL ESOPHAGEAL PHASE 07/13/2019 Cervical Esophageal Phase (No Data) Pudding Teaspoon -- Pudding Cup -- Honey Teaspoon -- Honey Cup -- Nectar Teaspoon -- Nectar Cup -- Nectar Straw -- Thin Teaspoon -- Thin Cup -- Thin Straw -- Puree -- Mechanical Soft -- Regular -- Multi-consistency -- Pill -- Cervical Esophageal Comment -- [place] Siren 07/13/2019, 9:56 AM Orbie Pyo Colvin Caroli.Ed Actor  Pager [phone] Office [phone]              ECHOCARDIOGRAM COMPLETE  Result Date: 07/13/2019    ECHOCARDIOGRAM REPORT   Patient Name:   JARETSY LEDBETTER Date of Exam: 07/13/2019 Medical Rec #:  [id number]      Height:       60.0 in Accession #:    [id number]     Weight:       183.0 lb Date of Birth:  1940-05-27      BSA:          1.80 m Patient Age:    79 years       BP:            156/104 mmHg Patient Gender: F              HR:           100 bpm. Exam Location:  Inpatient Procedure: 2D Echo, Color Doppler and Cardiac Doppler Indications:    Stroke i163.9  History:        Patient has no prior history of Echocardiogram examinations.                 Risk Factors:Hypertension, Dyslipidemia and Sleep Apnea.  Sonographer:    Raquel Sarna Senior RDCS Referring Phys: [phone] AMRIT ADHIKARI  Sonographer Comments: Technically difficult study due to poor echo windows. IMPRESSIONS  1. Low normal LVEF without focal wall motion abnormalities, though limited sensitivity given technically difficult study.  2. Left ventricular ejection fraction, by estimation, is 50 to 55%. The left ventricle has low normal function. The left ventricle has no regional wall motion abnormalities. Left ventricular diastolic parameters are indeterminate.  3. Right ventricular systolic function is low normal. The right ventricular size is normal.  4. The mitral valve is normal in structure and function. Trivial mitral valve regurgitation. No evidence of mitral stenosis.  5. The aortic valve is normal in structure and function. Aortic valve regurgitation is not visualized. No aortic stenosis is present. Conclusion(s)/Recomendation(s): No intracardiac source of embolism detected on this transthoracic study. A transesophageal echocardiogram is recommended to exclude cardiac source of embolism if clinically indicated. FINDINGS  Left Ventricle: Left ventricular ejection fraction, by estimation, is 50 to 55%. The left ventricle has low normal function. The left ventricle has no regional wall motion abnormalities. The left ventricular internal cavity size was normal in size. There is no left ventricular hypertrophy. Left ventricular diastolic parameters are indeterminate. Right Ventricle: The right ventricular size is normal. No increase in right ventricular wall thickness. Right ventricular systolic function is low normal. Left Atrium: Left  atrial size was normal in size. Right Atrium: Right atrial size was normal in size. Pericardium: There is no evidence of pericardial effusion. Mitral Valve: The mitral valve is normal in structure and function. Trivial mitral valve regurgitation. No evidence of mitral valve stenosis. Tricuspid Valve: The tricuspid valve is normal in structure. Tricuspid valve regurgitation is trivial. No evidence of tricuspid stenosis. Aortic Valve: The aortic valve is normal in structure and function. Aortic valve regurgitation is not visualized. No aortic stenosis is present. Pulmonic Valve: The pulmonic valve was not well visualized. Pulmonic valve regurgitation is not visualized. No evidence of pulmonic stenosis. Aorta: The aortic root, ascending aorta and aortic arch are all structurally normal, with no evidence of dilitation or obstruction. Pulmonary Artery: The pulmonary artery is not well seen. IAS/Shunts: The atrial septum is grossly normal.  LEFT VENTRICLE PLAX 2D LVIDd:  3.84 cm  Diastology LVIDs:         3.13 cm  LV e' lateral:   7.62 cm/s LV PW:         0.97 cm  LV E/e' lateral: 7.1 LV IVS:        1.34 cm  LV e' medial:    4.03 cm/s LVOT diam:     2.10 cm  LV E/e' medial:  13.4 LV SV:         43.64 ml LV SV Index:   12.89 LVOT Area:     3.46 cm  RIGHT VENTRICLE RV S prime:     10.20 cm/s TAPSE (M-mode): 1.4 cm LEFT ATRIUM           Index LA diam:      3.00 cm 1.67 cm/m LA Vol (A2C): 20.2 ml 11.24 ml/m LA Vol (A4C): 33.0 ml 18.36 ml/m  AORTIC VALVE LVOT Vmax:   80.90 cm/s LVOT Vmean:  55.400 cm/s LVOT VTI:    0.126 m  AORTA Ao Root diam: 2.70 cm Ao Asc diam:  3.40 cm MITRAL VALVE MV Area (PHT): 5.84 cm    SHUNTS MV Decel Time: 130 msec    Systemic VTI:  0.13 m MV E velocity: 54.00 cm/s  Systemic Diam: 2.10 cm MV A velocity: 94.30 cm/s MV E/A ratio:  0.57 Buford Dresser MD Electronically signed by Buford Dresser MD Signature Date/Time: 07/13/2019/9:02:49 PM    Final     PHYSICAL EXAM Pleasant  mildly obese elderly Caucasian lady not in distress. . Afebrile. Head is nontraumatic. Neck is supple without bruit.    Cardiac exam no murmur or gallop. Lungs are clear to auscultation. Distal pulses are well felt. Neurological Exam ;  Awake  Alert oriented x 3.  Mildly dysarthric speech.  No aphasia.  Follows commands well.  Eye movements full without nystagmus.fundi were not visualized. Vision acuity and fields appear normal. Hearing is normal. Palatal movements are normal.  Mild left lower facial weakness.  Tongue midline.Normal strength, tone, reflexes and coordination.  Diminished fine finger movements on the left.  Mild left grip weakness.  Orbits right over left upper extremity.  Normal sensation. Gait deferred. NIH stroke scale 2.  Premorbid modified Rankin 1 ASSESSMENT/PLAN Ms. SHONI KOLSTAD is a 79 y.o. female with history of HTN, HLD, obesity presenting with dysarthria, L facial droop, mild L hemiparesis.   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   MRI  R corona radiata and caudate head small vessel disease. infarcts  CTA head & neck pending   2D Echo pending   LDL 130  HgbA1c 5.5  Lovenox 40 mg sq daily for VTE prophylaxis  No antithrombotic prior to admission, now on clopidogrel 75 mg daily and aspirin 324 mg. Change aspirin to 81. Continue DAPT x 3 weeks then aspirin alone.   Therapy recommendations: CLR  Disposition: CLR  Patient interested in participating in the [employer] stroke trial. [facility] will follow up for possible enrollment. Please contact them at [phone] for any questions.    Hypertensive Urgency  BP elevated on arrival 202/110  BP now 170s, Stable . Permissive hypertension (OK if < 220/120) but gradually normalize in 5-7 days . Long-term BP goal normotensive  Hyperlipidemia  Home meds:  No statin  Now on lipitor 80  LDL 130, goal <  70  Continue statin at discharge  Other Stroke  Risk Factors  Advanced age  ETOH use, alcohol level <10, advised to drink no more than 1 drink(s) a day  Obesity, Body mass index is 35.74 kg/m., recommend weight loss, diet and exercise as appropriate. Additional info requested from RN  Obstructive sleep apnea, on CPAP at home  Pt/dtr reports hx imaging evidence of small AAA    Other Active Problems  Chronic back pain  Hospital day # 3  Transfer to inpatient rehab when bed available.  Recommend aspirin and Plavix for 3 weeks followed by aspirin alone. She is  participating in the [employer] stroke prevention trial( standard of care antiplatelets and Factor xi inhibitor versus standard of care antiplates and placebo)     Follow-up as an outpatient in the stroke research clinic as per study protocol.  Stroke team will sign off.  Kindly call for questions. Antony Contras, MD Medical Director [facility] Pager: [phone] 07/14/2019 1:12 PM   To contact Stroke Continuity provider, please refer to [email or web address] After hours, contact General Neurology

## 2019-07-14 NOTE — Progress Notes (Signed)
  Speech Language Pathology Treatment: Dysphagia  Patient Details Name: [name] [name] MRN: [id number] DOB: 03-Apr-1941 Today's Date: 07/14/2019 Time: [id number] SLP Time Calculation (min) (ACUTE ONLY): 22 min  Assessment / Plan / Recommendation Clinical Impression  Pt seen for dysphagia intervention following up after MBS yesterday. Pt reported she was unable to eat much today due to "uneasiness/queasiness with stomach" and stated it feels like GERD symptoms. Pt was belching during session after sips Ensure- sticky note to MD re: consideration of GER meds if indicated.   Pt is very self conscious of left facial/labial droop and leakage with liquids and soups and has stated several times with this therapist and OT-assistant. Introduced exercises for lips/face and explained there is no evidence to support. Therapist demonstrated each and pt imitated needing mild cues for accuracy and increased precision. Pt also concerned with speech since stroke- educated on speech strategies (did not charge for this as no orders to assess speech-cognition).     HPI HPI: 79 y.o. female  with history of obesity, hypertension, hyperlipidemia, Lt sciatic nerve pain. Presented to ED 07/11/19 with a facial droop and dysarthria. 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 CXR lungsl clear. Drooling, coughing with thin and prolonged mastication per RN.      SLP Plan  Continue with current plan of care       Recommendations  Diet recommendations: Regular;Thin liquid Liquids provided via: Cup Medication Administration: Whole meds with puree Supervision: Patient able to self feed;Full supervision/cueing for compensatory strategies Compensations: Slow rate;Minimize environmental distractions;Small sips/bites Postural Changes and/or Swallow Maneuvers: Seated upright 90 degrees;Upright 30-60 min after meal                Plan: Continue with current plan of care             Houston Siren 07/14/2019, 4:50 PM  Orbie Pyo Colvin Caroli.Ed Risk analyst [phone] Office [phone]

## 2019-07-14 NOTE — Progress Notes (Signed)
Physical Therapy Treatment Patient Details Name: [name] [name] MRN: [id number] DOB: 1940-11-18 Today's Date: 07/14/2019    History of Present Illness 79 y.o. female  with history of obesity, hypertension, hyperlipidemia, Lt sciatic nerve pain. Presented to ED 07/11/19 with a facial droop and dysarthria. 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 NIHSS=1; per neurologist permissive HTN up to 220/120    PT Comments    Patient seen for mobility progression. This session focused on gait training. Pt with chronic R knee pain and L LE weakness which is more prevalent with increased gait distance. Pt requires min A for OOB without use of AD. Continue to recommend CIR for further skilled PT services to maximize independence and safety with mobility.     Follow Up Recommendations  CIR;Supervision/Assistance - 24 hour     Equipment Recommendations  Other (comment)(TBA, already has several things)    Recommendations for Other Services       Precautions / Restrictions Precautions Precautions: Fall Restrictions Weight Bearing Restrictions: No    Mobility  Bed Mobility Overal bed mobility: Modified Independent Bed Mobility: Supine to Sit;Sit to Supine     Supine to sit: Min guard;Supervision Sit to supine: Min guard;Supervision   General bed mobility comments: pt OOB in chair upon arrival   Transfers Overall transfer level: Needs assistance Equipment used: None Transfers: Sit to/from Stand Sit to Stand: Min guard;Min assist         General transfer comment: assist to steady  Ambulation/Gait Ambulation/Gait assistance: Min assist Gait Distance (Feet): 80 Feet Assistive device: 1 person hand held assist(assist at trunk with gait belt) Gait Pattern/deviations: Step-through pattern;Drifts right/left Gait velocity: decr   General Gait Details: assistance for balance; with increased distance pt with antalgic gait and increased L LE weakness  noted; pt reports chronic R knee pain    Stairs             Wheelchair Mobility    Modified Rankin (Stroke Patients Only) Modified Rankin (Stroke Patients Only) Pre-Morbid Rankin Score: No symptoms Modified Rankin: Moderately severe disability     Balance Overall balance assessment: Needs assistance Sitting-balance support: Feet supported;No upper extremity supported Sitting balance-Leahy Scale: Fair     Standing balance support: Single extremity supported;During functional activity Standing balance-Leahy Scale: Poor Standing balance comment: completed oral care in standing, one hand on sink                            Cognition Arousal/Alertness: Awake/alert Behavior During Therapy: WFL for tasks assessed/performed Overall Cognitive Status: Within Functional Limits for tasks assessed                                 General Comments: for basic tasks, will continue to assess, A&Ox4      Exercises      General Comments        Pertinent Vitals/Pain Pain Assessment: No/denies pain Pain Location: headache earlier in AM but no pain now    Home Living                      Prior Function            PT Goals (current goals can now be found in the care plan section) Acute Rehab PT Goals Patient Stated Goal: return home when able Progress towards PT goals:  Progressing toward goals    Frequency    Min 4X/week      PT Plan Current plan remains appropriate    Co-evaluation              AM-PAC PT "6 Clicks" Mobility   Outcome Measure  Help needed turning from your back to your side while in a flat bed without using bedrails?: A Little Help needed moving from lying on your back to sitting on the side of a flat bed without using bedrails?: A Little Help needed moving to and from a bed to a chair (including a wheelchair)?: A Little Help needed standing up from a chair using your arms (e.g., wheelchair or bedside  chair)?: A Little Help needed to walk in hospital room?: A Little Help needed climbing 3-5 steps with a railing? : A Lot 6 Click Score: 17    End of Session Equipment Utilized During Treatment: Gait belt Activity Tolerance: Patient tolerated treatment well Patient left: with call bell/phone within reach;in chair;with family/visitor present Nurse Communication: Mobility status PT Visit Diagnosis: Unsteadiness on feet (R26.81);Other symptoms and signs involving the nervous system (R29.898)     Time: [id number] PT Time Calculation (min) (ACUTE ONLY): 18 min  Charges:  $Gait Training: 8-22 mins                     Earney Navy, PTA Acute Rehabilitation Services Pager: [phone] Office: [phone]     Darliss Cheney 07/14/2019, 4:44 PM

## 2019-07-14 NOTE — Progress Notes (Signed)
Occupational Therapy Treatment Patient Details Name: [name] [name] MRN: [id number] DOB: 1940/10/22 Today's Date: 07/14/2019    History of present illness 79 y.o. female  with history of obesity, hypertension, hyperlipidemia, Lt sciatic nerve pain. Presented to ED 07/11/19 with a facial droop and dysarthria. 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 NIHSS=1; per neurologist permissive HTN up to 220/120   OT comments  Patient continues to make steady progress towards goals in skilled OT session. Patient's session encompassed grooming ADL's at sink to assess functional mobility. Pt upset upon therapist's arrival in regards to feeling like doctor, "was forcing her out" after having anxiety in regards to trial eligibility as well as perseverative on facial droop and drooling when she eats. Pt had just gotten back to bed after going to the bathroom, however willing to ambulate to sink to complete oral care. Pt continues to make strong progress towards goals, however requires cues for minimal left inattention (left walker behind after completing oral care). Pt would continue to make an excellent candidate for CIR in order to progress with overall functional mobility and self-care activities as patient was independent prior to admission. Will continue to follow acutely.    Follow Up Recommendations  Supervision/Assistance - 24 hour;CIR    Equipment Recommendations  Tub/shower seat    Recommendations for Other Services      Precautions / Restrictions Precautions Precautions: Fall Restrictions Weight Bearing Restrictions: No       Mobility Bed Mobility Overal bed mobility: Modified Independent Bed Mobility: Supine to Sit;Sit to Supine     Supine to sit: Min guard;Supervision Sit to supine: Min guard;Supervision      Transfers Overall transfer level: Needs assistance Equipment used: Rolling walker (2 wheeled) Transfers: Sit to/from Stand Sit to  Stand: Min guard         General transfer comment: min guard for balance, RW used soley for safety    Balance Overall balance assessment: Needs assistance Sitting-balance support: Feet supported;No upper extremity supported Sitting balance-Leahy Scale: Fair     Standing balance support: Single extremity supported;During functional activity Standing balance-Leahy Scale: Fair Standing balance comment: completed oral care in standing, one hand on sink                           ADL either performed or assessed with clinical judgement   ADL Overall ADL's : Needs assistance/impaired     Grooming: Oral care;Brushing hair;Set up;Min guard;Sitting;Standing;Cueing for safety Grooming Details (indicate cue type and reason): Oral care completed in standing, brushing hair in sitting             Lower Body Dressing: Sitting/lateral leans;Set up;Min guard;Cueing for safety Lower Body Dressing Details (indicate cue type and reason): adjusting socks             Functional mobility during ADLs: Min guard;Set up General ADL Comments: pt with reports of headache/nausea this session, completed standing  and sitting grooming ADL during session     Vision Baseline Vision/History: Cataracts Patient Visual Report: No change from baseline     Perception     Praxis      Cognition Arousal/Alertness: Awake/alert Behavior During Therapy: WFL for tasks assessed/performed Overall Cognitive Status: Within Functional Limits for tasks assessed  General Comments: for basic tasks, will continue to assess, A&Ox4        Exercises     Shoulder Instructions       General Comments      Pertinent Vitals/ Pain       Pain Assessment: No/denies pain Pain Location: headache earlier in AM but no pain now  Home Living                                          Prior Functioning/Environment               Frequency  Min 2X/week        Progress Toward Goals  OT Goals(current goals can now be found in the care plan section)  Progress towards OT goals: Progressing toward goals  Acute Rehab OT Goals Patient Stated Goal: return home when able OT Goal Formulation: With patient Time For Goal Achievement: 07/26/19 Potential to Achieve Goals: Good  Plan Discharge plan remains appropriate    Co-evaluation                 AM-PAC OT "6 Clicks" Daily Activity     Outcome Measure   Help from another person eating meals?: None Help from another person taking care of personal grooming?: A Little Help from another person toileting, which includes using toliet, bedpan, or urinal?: A Little Help from another person bathing (including washing, rinsing, drying)?: A Little Help from another person to put on and taking off regular upper body clothing?: A Little Help from another person to put on and taking off regular lower body clothing?: A Little 6 Click Score: 19    End of Session Equipment Utilized During Treatment: Gait belt;Rolling walker  OT Visit Diagnosis: Unsteadiness on feet (R26.81);Muscle weakness (generalized) (M62.81);Other symptoms and signs involving the nervous system (R29.898)   Activity Tolerance Patient tolerated treatment well   Patient Left in chair;with call bell/phone within reach   Nurse Communication Mobility status        Time: [id number] OT Time Calculation (min): 15 min  Charges: OT General Charges $OT Visit: 1 Visit OT Treatments $Self Care/Home Management : 8-22 mins  Corinne Ports E. Taina Landry, COTA/L Acute Rehabilitation Services [phone] [place] 07/14/2019, 2:21 PM

## 2019-07-15 ENCOUNTER — Telehealth: Payer: Self-pay

## 2019-07-15 MED ORDER — PANTOPRAZOLE SODIUM 40 MG PO TBEC
40.0000 mg | DELAYED_RELEASE_TABLET | Freq: Every day | ORAL | Status: DC
  Administered 2019-07-15: 40 mg via ORAL
  Filled 2019-07-15: qty 1

## 2019-07-15 MED ORDER — ATORVASTATIN CALCIUM 80 MG PO TABS: 80.0000 mg | ORAL_TABLET | Freq: Every day | ORAL | 0 refills | Status: DC

## 2019-07-15 MED ORDER — CLOPIDOGREL BISULFATE 75 MG PO TABS: 75.0000 mg | ORAL_TABLET | Freq: Every day | ORAL | 0 refills | Status: AC

## 2019-07-15 MED ORDER — LOPERAMIDE HCL 2 MG PO CAPS
2.0000 mg | ORAL_CAPSULE | ORAL | Status: DC | PRN
  Administered 2019-07-15: 2 mg via ORAL
  Filled 2019-07-15: qty 1

## 2019-07-15 MED ORDER — ASPIRIN 81 MG PO CHEW: 81.0000 mg | CHEWABLE_TABLET | Freq: Every day | ORAL | 0 refills | Status: AC

## 2019-07-15 NOTE — TOC Transition Note (Signed)
Transition of Care [facility]) - CM/SW Discharge Note   Patient Details  Name: [name] [name] MRN: [id number] Date of Birth: Jul 12, 1940  [facility] CM/SW Contact:  Benard Halsted, LCSW Phone Number: 07/15/2019, 3:31 PM   Clinical Narrative:    CSW confirmed with patient's daughter that patient will be discharging home today. [facility] has already accepted the referral. MD putting orders in. Daughter denied need for equipment. No other needs identified. Patient's daughter will transport patient.    Final next level of care: Home w Home Health Services Barriers to Discharge: No Barriers Identified   Patient Goals and CMS Choice Patient states their goals for this hospitalization and ongoing recovery are:: to go home   Choice offered to / list presented to : Patient, Adult Children  Discharge Placement                Patient to be transferred to facility by: Car Name of family member notified: Daughter, [name] Patient and family notified of of transfer: 07/15/19  Discharge Plan and Services                DME Arranged: N/A         HH Arranged: PT, OT [place] Agency: [place] (now [facility]) Date [place]: 07/12/19 Time [place]: 1107 Representative spoke with at [place]: [place] ([place]) Interventions     Readmission Risk Interventions No flowsheet data found.

## 2019-07-15 NOTE — Telephone Encounter (Signed)
Patient's daughter, [name] [name], called states that patient has been at [facility] since 07/11/19 due to having stroke. Attending physician at the hospital suggested for patient to contact her PCP to ask for anti depression medication. [name] [name] was there in the room with patient during the phone call with me. I asked if there were active symptoms or concerns and [name] [name] stated that patient has been very emotional since the stroke and the attending physician said that patient's do have depression symptoms after having stroke. Patient may be discharged today per [name] [name] but not sure 100% at this time. Per patient she has not been given Hydroxyzine while at the hospital and she takes this medication as needed.

## 2019-07-15 NOTE — Discharge Summary (Signed)
. Physician Discharge Summary  [name] [name] [id number] DOB: 05-03-1941 DOA: 07/11/2019  PCP: Tonia Ghent, MD  Admit date: 07/11/2019 Discharge date: 07/15/2019  Admitted From: Home Disposition:  Discharged to home.  Recommendations for Outpatient Follow-up:  1. Follow up with PCP in 1 weeks 2. Please obtain BMP/CBC in one week  Discharge Condition: Stable  CODE STATUS: FULL   Brief/Interim Summary: [name] [name] a79 y.o.femalewith medical history significant ofhypertension, hyperlipidemia, chronic back pain, sleep apneaon CPAP at nightwho presents to the emergencydepartmentfrom home with complaint of weaknessof bilateral lower extremities, slurred speech.MRI showed acute small vessel infarction involving coronary radiata and caudate. Neurology wasconsulted.  07/15/19: W/u complete. Insurance denies CIR stay. Patient will [facility] with [facility]. Resume BP meds at normal dosing.   Discharge Diagnoses:  Principal Problem:   Ischemic stroke ([place]) Active Problems:   HYPERCHOLESTEROLEMIA   Essential hypertension   Acute CVA (cerebrovascular accident) ([place])   Mixed hyperlipidemia   OSA (obstructive sleep apnea)  Acute CVA - CT head: No acute intracranial hemorrhage, mass effect, or evidence of acute infarction. - MRI brain: Acute small vessel infarcts involving the right corona radiata and caudate. - CAT H/N: Positive for intracranial atherosclerosis with significant stenoses:- moderate to severe Left ICA anterior genu. - moderate bilateral ACA distal A2. - moderate Left PCA P1. - Neurology onboard - Echocardiogram results pending  - HDL: 33, LDL 130, Tchol 222 - A1c: 5.5  - DAPT, statin - SLP: reg, thin liquid     - 07/14/19: PT/OT/SLP: rec CIR; they have eval'd; awaiting insurance approval     - Neuro has s/o'd     - DAPT x 3 weeks, then ASA thereafter     - echo: Left Ventricle: Left ventricular ejection fraction, by  estimation, is 50 to 55%. The left ventricle has low normal function. The left ventricle has no regional wall motion abnormalities. The left ventricular internal  cavity size was normal in size. There is no left ventricular hypertrophy. Left ventricular diastolic parameters are indeterminate. No intracardiac source of embolism detected on this transthoracic study.     - 07/15/19: she has been denied CIR placement by her insurance company. She will go home on [facility]      - she will continue [employer] as per protocol.  Hypertension - Allow permissive hypertension through today - 07/14/19: resuming atenolol at lower dose, gradually increase to home dose     - 07/15/19: resume home meds at normal dosing  Hyperlipidemia - LDL 130 - Lipitor  OSA - CPAP nightly  Discharge Instructions   Allergies as of 07/15/2019      Reactions   Hydrocodone Nausea Only   Noted after surgery, may be able to tolerate with food      Medication List    TAKE these medications   acetaminophen 500 MG tablet Commonly known as: TYLENOL Take 500 mg by mouth every 6 (six) hours as needed for moderate pain.   aspirin 81 MG chewable tablet Chew 1 tablet (81 mg total) by mouth daily. Start taking on: July 16, 2019   atenolol 50 MG tablet Commonly known as: TENORMIN Take 1 tablet (50 mg total) by mouth 2 (two) times daily.   atorvastatin 80 MG tablet Commonly known as: LIPITOR Take 1 tablet (80 mg total) by mouth daily at 6 PM.   AZO BLADDER CONTROL/GO-LESS PO Take 1 tablet by mouth daily as needed (bladder control).   Biotin 10000 MCG Tabs Take 10,000 mcg by  mouth daily.   cetirizine 10 MG tablet Commonly known as: ZYRTEC Take 10 mg by mouth daily as needed for allergies.   cholecalciferol 1000 units tablet Commonly known as: VITAMIN D Take 1,000 Units by mouth daily.   clobetasol ointment 0.05 % Commonly known as: TEMOVATE Apply 1 application topically daily as needed  (for itching).   clopidogrel 75 MG tablet Commonly known as: PLAVIX Take 1 tablet (75 mg total) by mouth daily for 17 days. Start taking on: July 16, 2019   fluticasone 50 MCG/ACT nasal spray Commonly known as: FLONASE Place 2 sprays into both nostrils daily as needed for allergies or rhinitis.   GLUCOSAMINE CHONDR 1500 COMPLX PO Take 1 tablet by mouth 2 (two) times daily.   hydrOXYzine 10 MG tablet Commonly known as: ATARAX/VISTARIL Take 0.5-1 tablets (5-10 mg total) by mouth 3 (three) times daily as needed for anxiety (sedation caution).   hyoscyamine 0.125 MG tablet Commonly known as: LEVSIN Take 1 tablet (0.125 mg total) by mouth 3 (three) times daily as needed. What changed: reasons to take this   methocarbamol 500 MG tablet Commonly known as: ROBAXIN Take 500 mg by mouth daily as needed.      Follow-up Information    Home, [facility] Follow up.   Specialty: [place] Why: They will call you in a few days to set up your first home appointment Contact information: [address] [place] [address] [phone]          Allergies  Allergen Reactions  . Hydrocodone Nausea Only    Noted after surgery, may be able to tolerate with food    Consultations:  Neurology   Procedures/Studies: CT ANGIO HEAD W OR WO CONTRAST  Result Date: 07/12/2019 CLINICAL DATA:  79 year old female with acute lacunar 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 EXAM: CT ANGIOGRAPHY HEAD AND NECK TECHNIQUE: Multidetector CT imaging of the head and neck was performed using the standard protocol during bolus administration of intravenous contrast. Multiplanar CT image reconstructions and MIPs were obtained to evaluate the vascular anatomy. Carotid stenosis measurements (when applicable) are obtained utilizing NASCET criteria, using the distal internal carotid diameter as the denominator. CONTRAST:  146mL OMNIPAQUE IOHEXOL 350 MG/ML SOLN  COMPARISON:  Brain MRI and head CT 07/11/2019. FINDINGS: CT HEAD Brain: Subtle hypodensity in the right corona radiata corresponding to the diffusion abnormality with no intracranial hemorrhage or mass effect. Stable gray-white matter differentiation elsewhere. No ventriculomegaly. Calvarium and skull base: No acute osseous abnormality identified. Paranasal sinuses: Visualized paranasal sinuses and mastoids are stable and well pneumatized. Orbits: No acute orbit or scalp soft tissue finding. Small dystrophic calcifications in the right parotid gland. CTA NECK Skeleton: Cervical spine degeneration including mild C3-C4 spondylolisthesis. No acute osseous abnormality identified. Upper chest: Negative. Other neck: No acute soft tissue findings in the neck. Right parotid gland sialolithiasis again noted. Aortic arch: 3 vessel arch configuration with minimal arch atherosclerosis. Right carotid system: Mildly tortuous brachiocephalic artery and proximal right CCA without plaque or stenosis. Negative right carotid bifurcation. Tortuous cervical right ICA without plaque or stenosis. Left carotid system: Tortuous without plaque or stenosis. Vertebral arteries: Minimal plaque in the proximal right subclavian artery without stenosis. Normal right vertebral artery origin. Tortuous right V1 and V2 segments. The right vertebral is patent to the skull base without plaque or stenosis. Mild plaque in the proximal left subclavian artery without stenosis. Tortuosity of the vessel with a kinked appearance at  the thoracic inlet. Normal left vertebral artery origin. Tortuous left V1 and V2. Patent left vertebral to the skull base without plaque or stenosis. CTA HEAD Posterior circulation: Mild bilateral V4 segment plaque without stenosis. Patent PICA origins and vertebrobasilar junction. Patent basilar artery which is mildly tapered to the basilar tip (series 15, image 23). SCA and PCA origins remain patent. Posterior communicating  arteries are diminutive or absent. Moderate irregularity and stenosis at the left P1/P2 junction on series 14, image 21. Contralateral mild right P2 and P3 stenosis. Anterior circulation: Both ICA siphons are patent. On the left there is moderate calcified plaque and moderate to severe stenosis at the anterior genu (series 13, image 116). Normal left ophthalmic artery origin. On the right similar bulky calcified plaque but no significant right siphon stenosis. Normal right ophthalmic artery origin. Patent carotid termini. Normal MCA and ACA origins. Dominant right A1. Anterior communicating artery and proximal ACA branches are normal. There is bilateral distal A2 moderate stenosis as seen on series 16, image 23 and series 15, image 17. Left MCA M1 segment and bifurcation are patent without stenosis. Left MCA branches are within normal limits. Right MCA M1 segment and bifurcation are patent without stenosis. Right MCA branches are within normal limits. Venous sinuses: Patent. Anatomic variants: Dominant right ACA A1. Review of the MIP images confirms the above findings IMPRESSION: 1. Negative for large vessel occlusion, and no atherosclerosis or stenosis in the neck. 2. Positive for intracranial atherosclerosis with significant stenoses: - moderate to severe Left ICA anterior genu. - moderate bilateral ACA distal A2. - moderate Left PCA P1. 3. Subtle CT evidence of the acute right lacunar infarcts seen by MRI yesterday. No new intracranial abnormality. 4. Right parotid gland sialolithiasis. Electronically Signed   By: Genevie Ann M.D.   On: 07/12/2019 20:09   DG Chest 2 View  Result Date: 07/11/2019 CLINICAL DATA:  Shortness of breath EXAM: CHEST - 2 VIEW COMPARISON:  August 18, 2017 FINDINGS: Lungs are clear. Heart is upper normal in size with pulmonary vascularity normal. No adenopathy. There is degenerative change in the thoracic spine. IMPRESSION: Lungs clear.  Heart upper normal in size. Electronically Signed    By: Lowella Grip III M.D.   On: 07/11/2019 11:56   CT HEAD WO CONTRAST  Result Date: 07/11/2019 CLINICAL DATA:  Slurred speech and trouble swallowing EXAM: CT HEAD WITHOUT CONTRAST TECHNIQUE: Contiguous axial images were obtained from the base of the skull through the vertex without intravenous contrast. COMPARISON:  2014 FINDINGS: Brain: There is no acute intracranial hemorrhage, mass-effect, or edema. Gray-white differentiation is preserved. There is no extra-axial fluid collection. Ventricles and sulci are within normal limits in size and configuration. Vascular: No hyperdense vessel or unexpected calcification. Skull: Calvarium is unremarkable. Sinuses/Orbits: No acute finding. Other: None. IMPRESSION: No acute intracranial hemorrhage, mass effect, or evidence of acute infarction. Electronically Signed   By: Macy Mis M.D.   On: 07/11/2019 12:01   CT ANGIO NECK W OR WO CONTRAST  Result Date: 07/12/2019 CLINICAL DATA:  79 year old female with acute lacunar 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 EXAM: CT ANGIOGRAPHY HEAD AND NECK TECHNIQUE: Multidetector CT imaging of the head and neck was performed using the standard protocol during bolus administration of intravenous contrast. Multiplanar CT image reconstructions and MIPs were obtained to evaluate the vascular anatomy. Carotid stenosis measurements (when applicable) are obtained utilizing NASCET criteria, using the distal internal carotid diameter as the denominator. CONTRAST:  164mL  OMNIPAQUE IOHEXOL 350 MG/ML SOLN COMPARISON:  Brain MRI and head CT 07/11/2019. FINDINGS: CT HEAD Brain: Subtle hypodensity in the right corona radiata corresponding to the diffusion abnormality with no intracranial hemorrhage or mass effect. Stable gray-white matter differentiation elsewhere. No ventriculomegaly. Calvarium and skull base: No acute osseous abnormality identified. Paranasal sinuses: Visualized paranasal sinuses and  mastoids are stable and well pneumatized. Orbits: No acute orbit or scalp soft tissue finding. Small dystrophic calcifications in the right parotid gland. CTA NECK Skeleton: Cervical spine degeneration including mild C3-C4 spondylolisthesis. No acute osseous abnormality identified. Upper chest: Negative. Other neck: No acute soft tissue findings in the neck. Right parotid gland sialolithiasis again noted. Aortic arch: 3 vessel arch configuration with minimal arch atherosclerosis. Right carotid system: Mildly tortuous brachiocephalic artery and proximal right CCA without plaque or stenosis. Negative right carotid bifurcation. Tortuous cervical right ICA without plaque or stenosis. Left carotid system: Tortuous without plaque or stenosis. Vertebral arteries: Minimal plaque in the proximal right subclavian artery without stenosis. Normal right vertebral artery origin. Tortuous right V1 and V2 segments. The right vertebral is patent to the skull base without plaque or stenosis. Mild plaque in the proximal left subclavian artery without stenosis. Tortuosity of the vessel with a kinked appearance at the thoracic inlet. Normal left vertebral artery origin. Tortuous left V1 and V2. Patent left vertebral to the skull base without plaque or stenosis. CTA HEAD Posterior circulation: Mild bilateral V4 segment plaque without stenosis. Patent PICA origins and vertebrobasilar junction. Patent basilar artery which is mildly tapered to the basilar tip (series 15, image 23). SCA and PCA origins remain patent. Posterior communicating arteries are diminutive or absent. Moderate irregularity and stenosis at the left P1/P2 junction on series 14, image 21. Contralateral mild right P2 and P3 stenosis. Anterior circulation: Both ICA siphons are patent. On the left there is moderate calcified plaque and moderate to severe stenosis at the anterior genu (series 13, image 116). Normal left ophthalmic artery origin. On the right similar bulky  calcified plaque but no significant right siphon stenosis. Normal right ophthalmic artery origin. Patent carotid termini. Normal MCA and ACA origins. Dominant right A1. Anterior communicating artery and proximal ACA branches are normal. There is bilateral distal A2 moderate stenosis as seen on series 16, image 23 and series 15, image 17. Left MCA M1 segment and bifurcation are patent without stenosis. Left MCA branches are within normal limits. Right MCA M1 segment and bifurcation are patent without stenosis. Right MCA branches are within normal limits. Venous sinuses: Patent. Anatomic variants: Dominant right ACA A1. Review of the MIP images confirms the above findings IMPRESSION: 1. Negative for large vessel occlusion, and no atherosclerosis or stenosis in the neck. 2. Positive for intracranial atherosclerosis with significant stenoses: - moderate to severe Left ICA anterior genu. - moderate bilateral ACA distal A2. - moderate Left PCA P1. 3. Subtle CT evidence of the acute right lacunar infarcts seen by MRI yesterday. No new intracranial abnormality. 4. Right parotid gland sialolithiasis. Electronically Signed   By: Genevie Ann M.D.   On: 07/12/2019 20:09   MR BRAIN WO CONTRAST  Result Date: 07/13/2019 CLINICAL DATA:  Stroke, follow-up. Additional history provided: [employer] stroke trial protocol, no new symptoms or complaints. EXAM: MRI HEAD WITHOUT CONTRAST TECHNIQUE: Multiplanar, multiecho pulse sequences of the brain and surrounding structures were obtained without intravenous contrast. COMPARISON:  CT angiogram head/neck 07/12/2019, brain MRI 07/11/2019. FINDINGS: Brain: A research protocol brain MRI was performed. The acquired sequences consist of  axial diffusion-weighted imaging, an axial T2 FLAIR sequence, axial SWI sequence, axial T2 weighted sequence and axial T1 weighted sequence. Restricted diffusion consistent with early subacute info centered within the right corona radiata/body of caudate,  slightly increased in size as compared to MRI 07/11/2019. Corresponding T2/FLAIR hyperintensity at this site. No evidence of hemorrhage at this site. No new acute infarct is demonstrated elsewhere within the brain. Moderate patchy T2/FLAIR hyperintensity within the cerebral white matter is unchanged from prior examination and nonspecific, but consistent with chronic small vessel ischemic disease. Mild chronic ischemic changes are also present within the pons. Redemonstrated punctate chronic microhemorrhage within the left parietal lobe (series 4, image 66). Cerebral volume is normal for age. There is no evidence of intracranial mass. No midline shift or extra-axial fluid collection. Vascular: Flow voids maintained within the proximal large arterial vessels. Skull and upper cervical spine: No focal marrow lesion. Sinuses/Orbits: Visualized orbits demonstrate no acute abnormality. Minimal ethmoid sinus mucosal thickening. No significant mastoid effusion. IMPRESSION: 1. Early subacute infarct centered within the right corona radiata/caudate body, slightly increased in size from MRI 07/11/2019. No significant mass effect. No evidence of hemorrhage at this site. 2. No new acute infarct demonstrated elsewhere within the brain. 3. Stable background of moderate chronic small vessel ischemic disease. Electronically Signed   By: Kellie Simmering DO   On: 07/13/2019 12:13   MR BRAIN WO CONTRAST  Result Date: 07/11/2019 CLINICAL DATA:  Slurred speech and trouble swallowing EXAM: MRI HEAD WITHOUT CONTRAST TECHNIQUE: Multiplanar, multiecho pulse sequences of the brain and surrounding structures were obtained without intravenous contrast. COMPARISON:  2014 FINDINGS: Motion artifact is present Brain: There are adjacent small foci of restricted diffusion centered within the right corona radiata with some involvement of the caudate body. No evidence of hemorrhage. Patchy and confluent areas of T2 hyperintensity in the supratentorial  and pontine white matter are nonspecific but may reflect moderate chronic microvascular ischemic changes. There is no intracranial mass or significant mass effect. Ventricles and sulci are within normal limits in size and configuration. Vascular: Major vessel flow voids at the skull base are preserved. Skull and upper cervical spine: Marrow signal is within normal limits. Sinuses/Orbits: Minor paranasal sinus mucosal thickening. Bilateral lens replacements. Other: Mastoid air cells are clear.  Sella is unremarkable. IMPRESSION: Acute small vessel infarcts involving the right corona radiata and caudate. Moderate chronic microvascular ischemic changes. Electronically Signed   By: Macy Mis M.D.   On: 07/11/2019 15:56   DG Swallowing Func-Speech Pathology  Result Date: 07/13/2019 Objective Swallowing Evaluation: Type of Study: MBS-Modified Barium Swallow Study  Patient Details Name: HAILEN SHERIN MRN: [id number] Date of Birth: 28-Apr-1941 Today's Date: 07/13/2019 Time: SLP Start Time (ACUTE ONLY): 0900 -SLP Stop Time (ACUTE ONLY): 0915 SLP Time Calculation (min) (ACUTE ONLY): 15 min Past Medical History: Past Medical History: Diagnosis Date . Arthritis   knees . Depression  . Fatty liver  . GERD (gastroesophageal reflux disease)  . Hyperlipidemia  . Hypertension  . IBS (irritable bowel syndrome)  . Obesity  . Pneumonia  . Sleep apnea   uses CPAP Past Surgical History: Past Surgical History: Procedure Laterality Date . BLADDER SURGERY   . BREAST SURGERY    breast biopsy . BROW LIFT Bilateral 04/14/2017  Procedure: BLEPHAROPLASTY UPPER EYELID WITH EXCESS SKIN;  Surgeon: Karle Starch, MD;  Location: [place];  Service: Ophthalmology;  Laterality: Bilateral; . CATARACT EXTRACTION W/PHACO Left 02/05/2016  Procedure: CATARACT EXTRACTION PHACO AND INTRAOCULAR LENS PLACEMENT (IOC);  Surgeon: Birder Robson, MD;  Location: [facility];  Service: Ophthalmology;  Laterality: Left;  [place] 01:10 AP% 22.3 CDE 15.67  Fluid pack lot # [id number] . CATARACT EXTRACTION W/PHACO Right 02/26/2016  Procedure: CATARACT EXTRACTION PHACO AND INTRAOCULAR LENS PLACEMENT (IOC);  Surgeon: Birder Robson, MD;  Location: [facility];  Service: Ophthalmology;  Laterality: Right;  [place] 57.4 AP% 24.0 CDE 13.75 Fluid Pack lot # [id number] . CHOLECYSTECTOMY   . COLONOSCOPY WITH PROPOFOL N/A 12/18/2014  Procedure: COLONOSCOPY WITH PROPOFOL;  Surgeon: Manya Silvas, MD;  Location: [facility];  Service: Endoscopy;  Laterality: N/A; . DEEP NECK LYMPH NODE BIOPSY / EXCISION   . JOINT REPLACEMENT   . KNEE ARTHROPLASTY Left 06/20/2015  Procedure: COMPUTER ASSISTED TOTAL KNEE ARTHROPLASTY;  Surgeon: Dereck Leep, MD;  Location: [facility];  Service: Orthopedics;  Laterality: Left; . PTOSIS REPAIR Bilateral 04/14/2017  Procedure: PTOSIS REPAIR RESECT EX;  Surgeon: Karle Starch, MD;  Location: [place];  Service: Ophthalmology;  Laterality: Bilateral;  sleep apnea . TONSILLECTOMY   . TOTAL HIP ARTHROPLASTY Right 07/21/2018  Procedure: TOTAL HIP ARTHROPLASTY ANTERIOR APPROACH;  Surgeon: Gaynelle Arabian, MD;  Location: [facility];  Service: Orthopedics;  Laterality: Right; HPI: 79 y.o. female  with history of obesity, hypertension, hyperlipidemia, Lt sciatic nerve pain. Presented to ED 07/11/19 with a facial droop and dysarthria. 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 CXR lungsl clear. Drooling, coughing with thin and prolonged mastication per RN.  No data recorded Assessment / Plan / Recommendation CHL IP CLINICAL IMPRESSIONS 07/13/2019 Clinical Impression Minimal oropharyngeal dysphagia demonstrated and suspected esophageal involvement. There were two instances of thin barium entering laryngeal vestibule prior to full epiglottic deflection however penetrates appeared to be ejected fully during the swallow. Material observed entering upper esophageal sphincter with mild hesitation to transit x 1 with eructation.  Elevation of larynx was decreased intermittently therefore pill lodged in valleculae with thin liquid trials and transited with additional sips thin. MBS does not diagnose below the level of the  UES however esophageal scan revealed retention (mild) in mid esophagus that did not clear at end of study. She may benefit from GI consult. Recommend she continue regular texture, thin liquids, cups preferred, pills whole in puree, remain upright after meals, small bites/sips, alternate liquids and solids and refrain from verbalizing after swallows. Will follow to reiterate strategies and provide further feedback.        SLP Visit Diagnosis Dysphagia, pharyngoesophageal phase (R13.14) Attention and concentration deficit following -- Frontal lobe and executive function deficit following -- Impact on safety and function Mild aspiration risk   CHL IP TREATMENT RECOMMENDATION 07/13/2019 Treatment Recommendations Therapy as outlined in treatment plan below   Prognosis 07/13/2019 Prognosis for Safe Diet Advancement Good Barriers to Reach Goals -- Barriers/Prognosis Comment -- CHL IP DIET RECOMMENDATION 07/13/2019 SLP Diet Recommendations Regular solids;Thin liquid Liquid Administration via Cup Medication Administration Whole meds with puree Compensations Minimize environmental distractions;Slow rate;Small sips/bites Postural Changes Remain semi-upright after after feeds/meals (Comment);Seated upright at 90 degrees   CHL IP OTHER RECOMMENDATIONS 07/13/2019 Recommended Consults -- Oral Care Recommendations Oral care BID Other Recommendations --   CHL IP FOLLOW UP RECOMMENDATIONS 07/13/2019 Follow up Recommendations Inpatient Rehab   CHL IP FREQUENCY AND DURATION 07/13/2019 Speech Therapy Frequency (ACUTE ONLY) min 2x/week Treatment Duration 2 weeks      CHL IP ORAL PHASE 07/13/2019 Oral Phase Impaired Oral - Pudding Teaspoon -- Oral - Pudding Cup -- Oral -  Honey Teaspoon -- Oral - Honey Cup -- Oral - Nectar Teaspoon -- Oral - Nectar Cup  -- Oral - Nectar Straw -- Oral - Thin Teaspoon -- Oral - Thin Cup WFL Oral - Thin Straw WFL Oral - Puree -- Oral - Mech Soft -- Oral - Regular Delayed oral transit Oral - Multi-Consistency -- Oral - Pill WFL Oral Phase - Comment --  CHL IP PHARYNGEAL PHASE 07/13/2019 Pharyngeal Phase Impaired Pharyngeal- Pudding Teaspoon -- Pharyngeal -- Pharyngeal- Pudding Cup -- Pharyngeal -- Pharyngeal- Honey Teaspoon -- Pharyngeal -- Pharyngeal- Honey Cup -- Pharyngeal -- Pharyngeal- Nectar Teaspoon -- Pharyngeal -- Pharyngeal- Nectar Cup -- Pharyngeal -- Pharyngeal- Nectar Straw -- Pharyngeal -- Pharyngeal- Thin Teaspoon -- Pharyngeal -- Pharyngeal- Thin Cup Penetration/Aspiration during swallow Pharyngeal Material enters airway, remains ABOVE vocal cords then ejected out Pharyngeal- Thin Straw Penetration/Aspiration during swallow Pharyngeal Material enters airway, remains ABOVE vocal cords then ejected out Pharyngeal- Puree -- Pharyngeal -- Pharyngeal- Mechanical Soft -- Pharyngeal -- Pharyngeal- Regular WFL Pharyngeal -- Pharyngeal- Multi-consistency -- Pharyngeal -- Pharyngeal- Pill Pharyngeal residue - valleculae;Reduced anterior laryngeal mobility Pharyngeal -- Pharyngeal Comment --  CHL IP CERVICAL ESOPHAGEAL PHASE 07/13/2019 Cervical Esophageal Phase (No Data) Pudding Teaspoon -- Pudding Cup -- Honey Teaspoon -- Honey Cup -- Nectar Teaspoon -- Nectar Cup -- Nectar Straw -- Thin Teaspoon -- Thin Cup -- Thin Straw -- Puree -- Mechanical Soft -- Regular -- Multi-consistency -- Pill -- Cervical Esophageal Comment -- [place] Siren 07/13/2019, 9:56 AM Orbie Pyo Colvin Caroli.Ed Actor Pager [phone] Office [phone]              ECHOCARDIOGRAM COMPLETE  Result Date: 07/13/2019    ECHOCARDIOGRAM REPORT   Patient Name:   DIONISIA PORRES Date of Exam: 07/13/2019 Medical Rec #:  [id number]      Height:       60.0 in Accession #:    [id number]     Weight:       183.0 lb Date of Birth:  11-29-1940       BSA:          1.80 m Patient Age:    47 years       BP:           156/104 mmHg Patient Gender: F              HR:           100 bpm. Exam Location:  Inpatient Procedure: 2D Echo, Color Doppler and Cardiac Doppler Indications:    Stroke i163.9  History:        Patient has no prior history of Echocardiogram examinations.                 Risk Factors:Hypertension, Dyslipidemia and Sleep Apnea.  Sonographer:    Raquel Sarna Senior RDCS Referring Phys: [phone] AMRIT ADHIKARI  Sonographer Comments: Technically difficult study due to poor echo windows. IMPRESSIONS  1. Low normal LVEF without focal wall motion abnormalities, though limited sensitivity given technically difficult study.  2. Left ventricular ejection fraction, by estimation, is 50 to 55%. The left ventricle has low normal function. The left ventricle has no regional wall motion abnormalities. Left ventricular diastolic parameters are indeterminate.  3. Right ventricular systolic function is low normal. The right ventricular size is normal.  4. The mitral valve is normal in structure and function. Trivial mitral valve regurgitation. No evidence of mitral stenosis.  5. The aortic valve is normal in structure and function. Aortic  valve regurgitation is not visualized. No aortic stenosis is present. Conclusion(s)/Recomendation(s): No intracardiac source of embolism detected on this transthoracic study. A transesophageal echocardiogram is recommended to exclude cardiac source of embolism if clinically indicated. FINDINGS  Left Ventricle: Left ventricular ejection fraction, by estimation, is 50 to 55%. The left ventricle has low normal function. The left ventricle has no regional wall motion abnormalities. The left ventricular internal cavity size was normal in size. There is no left ventricular hypertrophy. Left ventricular diastolic parameters are indeterminate. Right Ventricle: The right ventricular size is normal. No increase in right ventricular wall thickness.  Right ventricular systolic function is low normal. Left Atrium: Left atrial size was normal in size. Right Atrium: Right atrial size was normal in size. Pericardium: There is no evidence of pericardial effusion. Mitral Valve: The mitral valve is normal in structure and function. Trivial mitral valve regurgitation. No evidence of mitral valve stenosis. Tricuspid Valve: The tricuspid valve is normal in structure. Tricuspid valve regurgitation is trivial. No evidence of tricuspid stenosis. Aortic Valve: The aortic valve is normal in structure and function. Aortic valve regurgitation is not visualized. No aortic stenosis is present. Pulmonic Valve: The pulmonic valve was not well visualized. Pulmonic valve regurgitation is not visualized. No evidence of pulmonic stenosis. Aorta: The aortic root, ascending aorta and aortic arch are all structurally normal, with no evidence of dilitation or obstruction. Pulmonary Artery: The pulmonary artery is not well seen. IAS/Shunts: The atrial septum is grossly normal.  LEFT VENTRICLE PLAX 2D LVIDd:         3.84 cm  Diastology LVIDs:         3.13 cm  LV e' lateral:   7.62 cm/s LV PW:         0.97 cm  LV E/e' lateral: 7.1 LV IVS:        1.34 cm  LV e' medial:    4.03 cm/s LVOT diam:     2.10 cm  LV E/e' medial:  13.4 LV SV:         43.64 ml LV SV Index:   12.89 LVOT Area:     3.46 cm  RIGHT VENTRICLE RV S prime:     10.20 cm/s TAPSE (M-mode): 1.4 cm LEFT ATRIUM           Index LA diam:      3.00 cm 1.67 cm/m LA Vol (A2C): 20.2 ml 11.24 ml/m LA Vol (A4C): 33.0 ml 18.36 ml/m  AORTIC VALVE LVOT Vmax:   80.90 cm/s LVOT Vmean:  55.400 cm/s LVOT VTI:    0.126 m  AORTA Ao Root diam: 2.70 cm Ao Asc diam:  3.40 cm MITRAL VALVE MV Area (PHT): 5.84 cm    SHUNTS MV Decel Time: 130 msec    Systemic VTI:  0.13 m MV E velocity: 54.00 cm/s  Systemic Diam: 2.10 cm MV A velocity: 94.30 cm/s MV E/A ratio:  0.57 Buford Dresser MD Electronically signed by Buford Dresser MD Signature  Date/Time: 07/13/2019/9:02:49 PM    Final       Subjective: Reports anxiety over condition.   Discharge Exam: Vitals:   07/15/19 0400 07/15/19 1050  BP: 139/90 (!) 179/105  Pulse:  93  Resp:  18  Temp: 98.2 F (36.8 C) 98.7 F (37.1 C)  SpO2: 94% 96%   Vitals:   07/14/19 1637 07/14/19 1948 07/15/19 0400 07/15/19 1050  BP: 133/86 139/78 139/90 (!) 179/105  Pulse: 84 88  93  Resp: 18   18  Temp: 98.5 F (36.9 C) 99.1 F (37.3 C) 98.2 F (36.8 C) 98.7 F (37.1 C)  TempSrc:  Oral    SpO2: 96% 93% 94% 96%  Weight:      Height:        General: 79 y.o. female resting in bed in NAD Cardiovascular: RRR, +S1, S2, no m/g/r, equal pulses throughout Respiratory: CTABL, no w/r/r, normal WOB GI: BS+, NDNT, no masses noted, no organomegaly noted MSK: No e/c/c Neuro: Alert to name, follows commands, some LUE weakness noted Psyc: Appropriate interaction and affect, calm/cooperative   The results of significant diagnostics from this hospitalization (including imaging, microbiology, ancillary and laboratory) are listed below for reference.     Microbiology: Recent Results (from the past 240 hour(s))  SARS CORONAVIRUS 2 (TAT 6-24 HRS) Nasopharyngeal Nasopharyngeal Swab     Status: None   Collection Time: 07/11/19  5:40 PM   Specimen: Nasopharyngeal Swab  Result Value Ref Range Status   SARS Coronavirus 2 NEGATIVE NEGATIVE Final    Comment: (NOTE) SARS-CoV-2 target nucleic acids are NOT DETECTED. The SARS-CoV-2 RNA is generally detectable in upper and lower respiratory specimens during the acute phase of infection. Negative results do not preclude SARS-CoV-2 infection, do not rule out co-infections with other pathogens, and should not be used as the sole basis for treatment or other patient management decisions. Negative results must be combined with clinical observations, patient history, and epidemiological information. The expected result is Negative. Fact Sheet for  Patients: SugarRoll.be Fact Sheet for Healthcare Providers: https://www.woods-mathews.com/ This test is not yet approved or cleared by the [place] FDA and  has been authorized for detection and/or diagnosis of SARS-CoV-2 by FDA under an Emergency Use Authorization (EUA). This EUA will remain  in effect (meaning this test can be used) for the duration of the COVID-19 declaration under Section 56 4(b)(1) of the Act, 21 U.S.C. section 360bbb-3(b)(1), unless the authorization is terminated or revoked sooner. Performed at [facility], [place] [address] [place], [place] [address]      Labs: BNP (last 3 results) No results for input(s): BNP in the last 8760 hours. Basic Metabolic Panel: Recent Labs  Lab 07/11/19 1100 07/12/19 0144 07/14/19 0233  NA 140 138 139  K 4.4 3.9 3.7  CL 104 103 104  CO2 24 25 25   GLUCOSE 102* 101* 113*  BUN 13 12 12   CREATININE 0.93 0.93 0.91  CALCIUM 10.1 9.5 9.6  MG  --   --  2.2   Liver Function Tests: Recent Labs  Lab 07/11/19 1100 07/14/19 0233  AST 25 24  ALT 16 16  ALKPHOS 101 78  BILITOT 0.8 0.9  PROT 6.8 6.2*  ALBUMIN 4.0 3.7   No results for input(s): LIPASE, AMYLASE in the last 168 hours. No results for input(s): AMMONIA in the last 168 hours. CBC: Recent Labs  Lab 07/11/19 1100 07/12/19 0144 07/14/19 0233  WBC 8.9 8.1 6.7  NEUTROABS 6.5  --  4.0  HGB 16.4* 14.8 15.1*  HCT 49.5* 44.6 45.7  MCV 96.1 94.3 94.0  PLT 260 235 236   Cardiac Enzymes: No results for input(s): CKTOTAL, CKMB, CKMBINDEX, TROPONINI in the last 168 hours. BNP: Invalid input(s): POCBNP CBG: No results for input(s): GLUCAP in the last 168 hours. D-Dimer No results for input(s): DDIMER in the last 72 hours. Hgb A1c No results for input(s): HGBA1C in the last 72 hours. Lipid Profile No results for input(s): CHOL, HDL, LDLCALC, TRIG, CHOLHDL, LDLDIRECT in  the last 72 hours. Thyroid function  studies No results for input(s): TSH, T4TOTAL, T3FREE, THYROIDAB in the last 72 hours.  Invalid input(s): FREET3 Anemia work up No results for input(s): VITAMINB12, FOLATE, FERRITIN, TIBC, IRON, RETICCTPCT in the last 72 hours. Urinalysis    Component Value Date/Time   COLORURINE YELLOW 07/11/2019 1100   APPEARANCEUR CLEAR 07/11/2019 1100   APPEARANCEUR Clear 11/26/2012 1630   LABSPEC 1.004 (L) 07/11/2019 1100   LABSPEC 1.009 11/26/2012 1630   PHURINE 7.0 07/11/2019 1100   GLUCOSEU NEGATIVE 07/11/2019 1100   GLUCOSEU Negative 11/26/2012 1630   HGBUR NEGATIVE 07/11/2019 1100   BILIRUBINUR NEGATIVE 07/11/2019 1100   BILIRUBINUR Neg 03/09/2017 1601   BILIRUBINUR Negative 11/26/2012 1630   KETONESUR NEGATIVE 07/11/2019 1100   PROTEINUR NEGATIVE 07/11/2019 1100   UROBILINOGEN 0.2 03/09/2017 1601   NITRITE NEGATIVE 07/11/2019 1100   LEUKOCYTESUR NEGATIVE 07/11/2019 1100   LEUKOCYTESUR Negative 11/26/2012 1630   Sepsis Labs Invalid input(s): PROCALCITONIN,  WBC,  LACTICIDVEN Microbiology Recent Results (from the past 240 hour(s))  SARS CORONAVIRUS 2 (TAT 6-24 HRS) Nasopharyngeal Nasopharyngeal Swab     Status: None   Collection Time: 07/11/19  5:40 PM   Specimen: Nasopharyngeal Swab  Result Value Ref Range Status   SARS Coronavirus 2 NEGATIVE NEGATIVE Final    Comment: (NOTE) SARS-CoV-2 target nucleic acids are NOT DETECTED. The SARS-CoV-2 RNA is generally detectable in upper and lower respiratory specimens during the acute phase of infection. Negative results do not preclude SARS-CoV-2 infection, do not rule out co-infections with other pathogens, and should not be used as the sole basis for treatment or other patient management decisions. Negative results must be combined with clinical observations, patient history, and epidemiological information. The expected result is Negative. Fact Sheet for Patients: SugarRoll.be Fact Sheet for Healthcare  Providers: https://www.woods-mathews.com/ This test is not yet approved or cleared by the [place] FDA and  has been authorized for detection and/or diagnosis of SARS-CoV-2 by FDA under an Emergency Use Authorization (EUA). This EUA will remain  in effect (meaning this test can be used) for the duration of the COVID-19 declaration under Section 56 4(b)(1) of the Act, 21 U.S.C. section 360bbb-3(b)(1), unless the authorization is terminated or revoked sooner. Performed at [facility], [place] [address] [place], [place] [address]      Time coordinating discharge: 35 minutes  SIGNED:   Jonnie Finner, DO  [facility] 07/15/2019, 3:29 PM   If 7PM-7AM, please contact night-coverage [email or web address]

## 2019-07-15 NOTE — Progress Notes (Signed)
Patient did not want to go on her HCPAP.

## 2019-07-15 NOTE — Progress Notes (Signed)
Physical Therapy Treatment Patient Details Name: [name] [name] MRN: [id number] DOB: [birth year] Today's Date: 07/15/2019    History of Present Illness 79 y.o. female  with history of obesity, hypertension, hyperlipidemia, Lt sciatic nerve pain. Presented to ED 07/11/19 with a facial droop and dysarthria. 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 NIHSS=1; per neurologist permissive HTN up to 220/120    PT Comments    Pt making good progress with mobility.  She was able to increase gait to 350' with RW but needs cues for safety and heel strike on left.  Continues to demonstrate decreased balance and mild decrease in L LE strength- will continue to benefit from PT.  Insurance has denied CIR and family plan on providing 24 hr support and HHPT.     Follow Up Recommendations  Home health PT;Supervision/Assistance - 24 hour     Equipment Recommendations  None recommended by PT(has DME)    Recommendations for Other Services       Precautions / Restrictions Precautions Precautions: Fall    Mobility  Bed Mobility               General bed mobility comments: pt OOB in chair upon arrival   Transfers Overall transfer level: Needs assistance Equipment used: None Transfers: Sit to/from Stand Sit to Stand: Supervision         General transfer comment: supervision with standing using UE to push up  Ambulation/Gait Ambulation/Gait assistance: Min guard Gait Distance (Feet): 350 Feet Assistive device: Rolling walker (2 wheeled) Gait Pattern/deviations: Step-through pattern Gait velocity: decr   General Gait Details: drifts left at times; when not focused has decreased foot clearance on left - can correct with cues for heel strike (educated pt and dtr on monitoring);   Stairs             Wheelchair Mobility    Modified Rankin (Stroke Patients Only)       Balance Overall balance assessment: Needs assistance Sitting-balance  support: Feet supported;No upper extremity supported Sitting balance-Leahy Scale: Good     Standing balance support: Bilateral upper extremity supported;During functional activity Standing balance-Leahy Scale: Fair               High level balance activites: Head turns(able to perform head turns with gait using RW)              Cognition Arousal/Alertness: Awake/alert Behavior During Therapy: WFL for tasks assessed/performed Overall Cognitive Status: Within Functional Limits for tasks assessed                                 General Comments: Pt reports feeling "like I could burst out in tears."  - reports MD is aware and wants her to see PCP about anti anxiety/depressant med;  A and O x 4; daughter present; reports Insurance has denied CIR      Exercises General Exercises - Lower Extremity Hip Flexion/Marching: AROM;10 reps;Standing(with min guard) Heel Raises: AROM;Both;10 reps;Standing(with RW) Other Exercises Other Exercises: Sit<>stand: R LE forward to focus on L LE, required use of hands on knees but performed x 5 with min guard    General Comments        Pertinent Vitals/Pain Pain Assessment: No/denies pain    Home Living                      Prior Function  PT Goals (current goals can now be found in the care plan section) Progress towards PT goals: Progressing toward goals    Frequency    Min 4X/week      PT Plan Current plan remains appropriate    Co-evaluation              AM-PAC PT "6 Clicks" Mobility   Outcome Measure  Help needed turning from your back to your side while in a flat bed without using bedrails?: None Help needed moving from lying on your back to sitting on the side of a flat bed without using bedrails?: None Help needed moving to and from a bed to a chair (including a wheelchair)?: None Help needed standing up from a chair using your arms (e.g., wheelchair or bedside chair)?:  None Help needed to walk in hospital room?: None Help needed climbing 3-5 steps with a railing? : A Little 6 Click Score: 23    End of Session Equipment Utilized During Treatment: Gait belt Activity Tolerance: Patient tolerated treatment well Patient left: with call bell/phone within reach;in chair;with family/visitor present Nurse Communication: Mobility status PT Visit Diagnosis: Unsteadiness on feet (R26.81);Other symptoms and signs involving the nervous system (R29.898)     Time: [id number] PT Time Calculation (min) (ACUTE ONLY): 25 min  Charges:  $Gait Training: 8-22 mins $Therapeutic Exercise: 8-22 mins                     Maggie Font, PT Acute Rehab Services Pager [phone] [facility] [phone] [facility] [phone]    Karlton Lemon 07/15/2019, 2:33 PM

## 2019-07-15 NOTE — Telephone Encounter (Signed)
Advised pt's daughter. Made one week hospital f/u since pt is being d/c today. Advised if anything is needed to contact office. [name] verbalized understanding.

## 2019-07-15 NOTE — Progress Notes (Signed)
Inpatient Rehabilitation Admissions Coordinator  I have received a denial from [facility] for an inpt rehab admit. I contacted Dr. Marylyn Ishihara and he is aware. I met with patient at bedside and spoke with her daughter, [name]. They are aware and request  [facility] and DME . I have alerted RN CM , Crystal and RN CM, Aldona Bar. We will sign off at this time.  Danne Baxter, RN, MSN Rehab Admissions Coordinator [phone] 07/15/2019 10:43 AM

## 2019-07-15 NOTE — Telephone Encounter (Signed)
I would try restarting hydroxyzine as needed in the meantime and see if that helps.  She needs f/u here in the near future (or via video/phone) and we can address then.  Thanks.

## 2019-07-18 ENCOUNTER — Other Ambulatory Visit: Payer: Self-pay

## 2019-07-18 ENCOUNTER — Telehealth: Payer: Self-pay

## 2019-07-18 NOTE — Telephone Encounter (Signed)
Noted. Thanks.

## 2019-07-18 NOTE — Telephone Encounter (Signed)
Transition Care Management Follow-up Telephone Call  Date of discharge and from where: 07/15/2019[facility]  How have you been since you were released from the hospital? Patient is feeling fine since being discharge home.   Any questions or concerns? Yes , Patient would prefer to have inpatient rehab completed at [facility]. She wants to receive the best rehab possible since her stroke.   Items Reviewed:  Did the pt receive and understand the discharge instructions provided? Yes   Medications obtained and verified? Yes   Any new allergies since your discharge? No   Dietary orders reviewed? Yes  Do you have support at home? Yes   Functional Questionnaire: (I = Independent and D = Dependent) ADLs: I  Bathing/Dressing- I  Meal Prep- I  Eating- I  Maintaining continence- I  Transferring/Ambulation- I  Managing Meds- I  Follow up appointments reviewed:   PCP Hospital f/u appt confirmed? Yes  Scheduled to see Dr. Damita Dunnings on 07/21/2019 @ 2:30 pm.  [facility] f/u appt confirmed? N/A   Are transportation arrangements needed? No   If their condition worsens, is the pt aware to call PCP or go to the Emergency Dept.? Yes  Was the patient provided with contact information for the PCP's office or ED? Yes  Was to pt encouraged to call back with questions or concerns? Yes

## 2019-07-18 NOTE — Patient Outreach (Signed)
[facility]) Care Management  07/18/2019  [name] [name] July 06, 1940 [id number]   EMMI- stroke RED ON EMMI ALERT Day # 1 Date: 07/17/19 Red Alert Reason:  Problems setting up rehab? Yes  Outreach attempt: Spoke with patient and daughter.  She states that [facility] had not contacted her over the weekend. She states that they have been in contact with hospital social worker and states that home health to reach out today to schedule visit. Patient anxious to start therapy to begin her rehab. She states she has her medications and PCP appointment on Thursday.  Daughter who has been helping her will get her to her appointment.  She states that she is never alone and that all her 3 children are active in her care. She denies any needs at this.  Advised to call CM if she has further questions or concerns.  She verbalized understanding.  Patient has CM contact information.   Plan: RN CM will close case.     Jone Baseman, RN, MSN [facility] Care Management Care Management Coordinator Direct Line [phone] Toll Free: [phone]  Fax: [phone]

## 2019-07-19 DIAGNOSIS — R1310 Dysphagia, unspecified: Secondary | ICD-10-CM | POA: Diagnosis not present

## 2019-07-19 DIAGNOSIS — G8929 Other chronic pain: Secondary | ICD-10-CM | POA: Diagnosis not present

## 2019-07-19 DIAGNOSIS — G4733 Obstructive sleep apnea (adult) (pediatric): Secondary | ICD-10-CM | POA: Diagnosis not present

## 2019-07-19 DIAGNOSIS — F411 Generalized anxiety disorder: Secondary | ICD-10-CM | POA: Diagnosis not present

## 2019-07-19 DIAGNOSIS — M1711 Unilateral primary osteoarthritis, right knee: Secondary | ICD-10-CM | POA: Diagnosis not present

## 2019-07-19 DIAGNOSIS — F329 Major depressive disorder, single episode, unspecified: Secondary | ICD-10-CM | POA: Diagnosis not present

## 2019-07-19 DIAGNOSIS — I10 Essential (primary) hypertension: Secondary | ICD-10-CM | POA: Diagnosis not present

## 2019-07-19 DIAGNOSIS — I69392 Facial weakness following cerebral infarction: Secondary | ICD-10-CM | POA: Diagnosis not present

## 2019-07-19 DIAGNOSIS — M5126 Other intervertebral disc displacement, lumbar region: Secondary | ICD-10-CM | POA: Diagnosis not present

## 2019-07-20 ENCOUNTER — Telehealth: Payer: Self-pay

## 2019-07-20 NOTE — Telephone Encounter (Signed)
Barnetta Chapel PT notified as instructed by telephone. Verbal order given as instructed.

## 2019-07-20 NOTE — Telephone Encounter (Signed)
Please give the order.  Thanks.  Noted about her blood pressure.  We can talk about this at follow-up.  Thanks.

## 2019-07-20 NOTE — Telephone Encounter (Signed)
Barnetta Chapel PT with [facility] left v/m requesting verbal orders HH PT 1 x a wk for 1 wk;  2 x a wk for 2 wks and 1 x a wk for 3 wks. Barnetta Chapel also wanted Dr Damita Dunnings to know BP 155/90 and pt already has 30' appt with Dr Damita Dunnings for [place] on 07/21/19 at 2:30.

## 2019-07-21 ENCOUNTER — Ambulatory Visit (INDEPENDENT_AMBULATORY_CARE_PROVIDER_SITE_OTHER): Payer: Medicare HMO | Admitting: Family Medicine

## 2019-07-21 ENCOUNTER — Other Ambulatory Visit: Payer: Self-pay

## 2019-07-21 ENCOUNTER — Encounter: Payer: Self-pay | Admitting: Family Medicine

## 2019-07-21 VITALS — BP 136/72 | HR 77 | Temp 97.5°F | Ht 60.0 in | Wt 183.0 lb

## 2019-07-21 DIAGNOSIS — F329 Major depressive disorder, single episode, unspecified: Secondary | ICD-10-CM

## 2019-07-21 DIAGNOSIS — F411 Generalized anxiety disorder: Secondary | ICD-10-CM | POA: Diagnosis not present

## 2019-07-21 DIAGNOSIS — R1310 Dysphagia, unspecified: Secondary | ICD-10-CM | POA: Diagnosis not present

## 2019-07-21 DIAGNOSIS — I10 Essential (primary) hypertension: Secondary | ICD-10-CM

## 2019-07-21 DIAGNOSIS — M4800 Spinal stenosis, site unspecified: Secondary | ICD-10-CM | POA: Diagnosis not present

## 2019-07-21 DIAGNOSIS — M5126 Other intervertebral disc displacement, lumbar region: Secondary | ICD-10-CM | POA: Diagnosis not present

## 2019-07-21 DIAGNOSIS — E7800 Pure hypercholesterolemia, unspecified: Secondary | ICD-10-CM

## 2019-07-21 DIAGNOSIS — G8929 Other chronic pain: Secondary | ICD-10-CM | POA: Diagnosis not present

## 2019-07-21 DIAGNOSIS — F32A Depression, unspecified: Secondary | ICD-10-CM

## 2019-07-21 DIAGNOSIS — I69392 Facial weakness following cerebral infarction: Secondary | ICD-10-CM | POA: Diagnosis not present

## 2019-07-21 DIAGNOSIS — I639 Cerebral infarction, unspecified: Secondary | ICD-10-CM | POA: Diagnosis not present

## 2019-07-21 DIAGNOSIS — G4733 Obstructive sleep apnea (adult) (pediatric): Secondary | ICD-10-CM | POA: Diagnosis not present

## 2019-07-21 DIAGNOSIS — M1711 Unilateral primary osteoarthritis, right knee: Secondary | ICD-10-CM | POA: Diagnosis not present

## 2019-07-21 MED ORDER — SERTRALINE HCL 25 MG PO TABS: 25.0000 mg | ORAL_TABLET | Freq: Every day | ORAL | 1 refills | Status: DC

## 2019-07-21 NOTE — Progress Notes (Signed)
This visit occurred during the SARS-CoV-2 public health emergency.  Safety protocols were in place, including screening questions prior to the visit, additional usage of staff PPE, and extensive cleaning of exam room while observing appropriate contact time as indicated for disinfecting solutions.  =============== Admit date: 07/11/2019 Discharge date: 07/15/2019  Admitted From: Home Disposition:  Discharged to home.  Recommendations for Outpatient Follow-up:  1. Follow up with PCP in 1 weeks 2. Please obtain BMP/CBC in one week  Discharge Condition: Stable  CODE STATUS: FULL   Brief/Interim Summary: [name] [name] a78 y.o.femalewith medical history significant ofhypertension, hyperlipidemia, chronic back pain, sleep apneaon CPAP at nightwho presents to the emergencydepartmentfrom home with complaint of weaknessof bilateral lower extremities, slurred speech.MRI showed acute small vessel infarction involving coronary radiata and caudate. Neurology wasconsulted.  07/15/19: W/u complete. Insurance denies CIR stay. Patient will go home with [facility]. Resume BP meds at normal dosing.   Discharge Diagnoses:  Principal Problem:   Ischemic stroke ([place]) Active Problems:   HYPERCHOLESTEROLEMIA   Essential hypertension   Acute CVA (cerebrovascular accident) ([place])   Mixed hyperlipidemia   OSA (obstructive sleep apnea)  Acute CVA - CT head: No acute intracranial hemorrhage, mass effect, or evidence of acute infarction. - MRI brain: Acute small vessel infarcts involving the right corona radiata and caudate. - CAT H/N: Positive for intracranial atherosclerosis with significant stenoses:- moderate to severe Left ICA anterior genu. - moderate bilateral ACA distal A2. - moderate Left PCA P1. - Neurology onboard - Echocardiogram results pending  - HDL: 33, LDL 130, Tchol 222 - A1c: 5.5  - DAPT, statin - SLP: reg, thin liquid -  07/14/19:PT/OT/SLP: rec CIR; they have eval'd; awaiting insurance approval - Neuro has s/o'd - DAPT x 3 weeks, then ASA thereafter - echo: Left Ventricle: Left ventricular ejection fraction, by estimation, is 50 to 55%. The left ventricle has low normal function. The left ventricle has no regional wall motion abnormalities. The left ventricular internal  cavity size was normal in size. There is no left ventricular hypertrophy. Left ventricular diastolic parameters are indeterminate. No intracardiac source of embolism detected on this transthoracic study.     - 07/15/19: she has been denied CIR placement by her insurance company. She will go home on [facility].      - she will continue [employer] as per protocol.  Hypertension - Allow permissive hypertension through today -07/14/19: resuming atenolol at lower dose, gradually increase to home dose     - 07/15/19: resume home meds at normal dosing  Hyperlipidemia - LDL 130 - Lipitor  OSA - CPAP nightly =============== Inpatient f/u.   Admitted with stroke.  Inpatient imaging discussed with patient.  Inpatient course discussed with patient.  See above.  Rationale for secondary prevention discussed with patient.  Current med use discussed with patient.  She did not qualify for inpatient rehab through her insurance.  She is going to have home health set up at home.  She has family helping out at home.  She has made some progress in the meantime.  She is regaining her strength and her facial droop is improved.  She is clearly not back to baseline but she has made some progress. No new neurologic deficits in the meantime.  Strength and L hand and balance are clearly better since presentation.  Aspiration cautions discussed with patient, she is chopping her food well.  Hypertension.  Blood pressure controlled.  Compliant with meds.  No adverse effect on medication  Hyperlipidemia.  Discussed with patient about statin use and  rationale.  Tolerating medication so far.  Not yet due for follow-up lipid panel.    Mood d/w pt.  Panic sx, anxious.  rx prev sent for hydroxyzine.  She had some sedation on med but it helps when she takes it at night.  She had previously been on sertraline we talked about restarting this.  MRI L spine per [name].  She does have spinal stenosis noted.  Her aorta was not visualized on the MRI.  Discussed with patient.  She had Covid vaccine previously.  She had injection in her knee for OA.    PMH and SH reviewed  ROS: Per HPI unless specifically indicated in ROS section   Meds, vitals, and allergies reviewed.   GEN: nad, alert and oriented HEENT: ncat she has some mild residual left facial droop.  Cranial nerves intact otherwise. NECK: supple w/o LA CV: rrr.  PULM: ctab, no inc wob ABD: soft, +bs EXT: no edema SKIN: no acute rash

## 2019-07-21 NOTE — Patient Instructions (Addendum)
Restart sertraline 25mg  a day.  Use the hydroxyzine of if needed.  Go to the lab on the way out.   If you have mychart we'll likely use that to update you.    Take care.  Glad to see you.

## 2019-07-22 ENCOUNTER — Telehealth: Payer: Self-pay | Admitting: Family Medicine

## 2019-07-22 ENCOUNTER — Encounter: Payer: Self-pay | Admitting: Family Medicine

## 2019-07-22 ENCOUNTER — Telehealth: Payer: Self-pay

## 2019-07-22 LAB — CBC WITH DIFFERENTIAL/PLATELET
Basophils Absolute: 0.1 10*3/uL (ref 0.0–0.1)
Basophils Relative: 1.1 % (ref 0.0–3.0)
Eosinophils Absolute: 0.2 10*3/uL (ref 0.0–0.7)
Eosinophils Relative: 2.7 % (ref 0.0–5.0)
HCT: 44.8 % (ref 36.0–46.0)
Hemoglobin: 15.2 g/dL — ABNORMAL HIGH (ref 12.0–15.0)
Lymphocytes Relative: 22.5 % (ref 12.0–46.0)
Lymphs Abs: 2.1 10*3/uL (ref 0.7–4.0)
MCHC: 34 g/dL (ref 30.0–36.0)
MCV: 93.7 fl (ref 78.0–100.0)
Monocytes Absolute: 0.8 10*3/uL (ref 0.1–1.0)
Monocytes Relative: 8.5 % (ref 3.0–12.0)
Neutro Abs: 6.1 10*3/uL (ref 1.4–7.7)
Neutrophils Relative %: 65.2 % (ref 43.0–77.0)
Platelets: 314 10*3/uL (ref 150.0–400.0)
RBC: 4.78 Mil/uL (ref 3.87–5.11)
RDW: 12.8 % (ref 11.5–15.5)
WBC: 9.3 10*3/uL (ref 4.0–10.5)

## 2019-07-22 LAB — BASIC METABOLIC PANEL
BUN: 16 mg/dL (ref 6–23)
CO2: 26 mEq/L (ref 19–32)
Calcium: 10.5 mg/dL (ref 8.4–10.5)
Chloride: 105 mEq/L (ref 96–112)
Creatinine, Ser: 1.18 mg/dL (ref 0.40–1.20)
GFR: 44.43 mL/min — ABNORMAL LOW (ref >60.00)
Glucose, Bld: 95 mg/dL (ref 70–99)
Potassium: 4.7 mEq/L (ref 3.5–5.1)
Sodium: 140 mEq/L (ref 135–145)

## 2019-07-22 NOTE — Telephone Encounter (Signed)
[name] [name] with [facility] advised.

## 2019-07-22 NOTE — Telephone Encounter (Signed)
Please give the order.  Thanks.   

## 2019-07-22 NOTE — Telephone Encounter (Signed)
[name] [name] with [facility] is asking for Verbal orders for OT 1 x week for 3 weeks Also asking for Speech Therapy evaluation. Please call  Orders to [phone]

## 2019-07-22 NOTE — Telephone Encounter (Signed)
[name] [name] (pt son) had [employer] to fax over FMLA paperwork to help take care of pt.  He stated she needed 24/7 care.  Him and his siblings are sharing the time needed to take care of mom  Paperwork in Dr Josefine Class in box

## 2019-07-24 DIAGNOSIS — M4800 Spinal stenosis, site unspecified: Secondary | ICD-10-CM | POA: Insufficient documentation

## 2019-07-24 NOTE — Assessment & Plan Note (Signed)
Per Dr. Herma Mering.  I will defer.

## 2019-07-24 NOTE — Assessment & Plan Note (Addendum)
Blood pressure controlled, continue statin.  See notes on follow-up labs.  She has made some progress in the meantime.  She is going to follow-up with home health physical therapy and Occupational Therapy.  She has family support.  We talked about aspiration precautions.  I expect her to make some progress hopefully in the near future.  She understood.  We talked about her mood after the upheaval with the stroke.  Reasonable to restart sertraline in the meantime and she will update me if this is not helping.  Still okay for outpatient follow-up.   I will route this note to neurology in the meantime, for input on follow-up at their clinic.

## 2019-07-24 NOTE — Assessment & Plan Note (Signed)
See above.  No change in meds at this point.

## 2019-07-24 NOTE — Telephone Encounter (Signed)
Form done. Thanks. 

## 2019-07-24 NOTE — Telephone Encounter (Signed)
Noted  

## 2019-07-25 NOTE — Telephone Encounter (Signed)
Copy for pt Copy for scan  [name] [name] is aware

## 2019-07-25 NOTE — Telephone Encounter (Signed)
Paperwork faxed °

## 2019-07-26 ENCOUNTER — Telehealth: Payer: Self-pay

## 2019-07-26 ENCOUNTER — Other Ambulatory Visit: Payer: Self-pay

## 2019-07-26 DIAGNOSIS — R1310 Dysphagia, unspecified: Secondary | ICD-10-CM | POA: Diagnosis not present

## 2019-07-26 DIAGNOSIS — M1711 Unilateral primary osteoarthritis, right knee: Secondary | ICD-10-CM | POA: Diagnosis not present

## 2019-07-26 DIAGNOSIS — G8929 Other chronic pain: Secondary | ICD-10-CM | POA: Diagnosis not present

## 2019-07-26 DIAGNOSIS — G4733 Obstructive sleep apnea (adult) (pediatric): Secondary | ICD-10-CM | POA: Diagnosis not present

## 2019-07-26 DIAGNOSIS — I10 Essential (primary) hypertension: Secondary | ICD-10-CM | POA: Diagnosis not present

## 2019-07-26 DIAGNOSIS — F411 Generalized anxiety disorder: Secondary | ICD-10-CM | POA: Diagnosis not present

## 2019-07-26 DIAGNOSIS — F329 Major depressive disorder, single episode, unspecified: Secondary | ICD-10-CM | POA: Diagnosis not present

## 2019-07-26 DIAGNOSIS — M5126 Other intervertebral disc displacement, lumbar region: Secondary | ICD-10-CM | POA: Diagnosis not present

## 2019-07-26 DIAGNOSIS — I69392 Facial weakness following cerebral infarction: Secondary | ICD-10-CM | POA: Diagnosis not present

## 2019-07-26 NOTE — Patient Outreach (Signed)
[facility]) Care Management  07/26/2019  [name] [name] 17-Jan-1941 [id number]   EMMI- Stroke RED ON EMMI ALERT Day # 9 Date: 07/25/19 Red Alert Reason:  Sad, Hopeless, anxious, empty? Yes  Outreach attempt: Spoke with patient. She confirmed that her answer was a mistake.  She did confirm however having some nightmares and trouble sleeping at night and that her doctor ordered her some Zoloft recently.  Discussed stroke recovery and normal problems with anxiety and depression afterwards.  She verbalized understanding.  Discussed ongoing [facility] services and further nurse telephone follow up. Patient declined at this time.     Plan: RN CM will close case.    Jone Baseman, RN, MSN [facility] Care Management Care Management Coordinator Direct Line [phone] Toll Free: [phone]  Fax: [phone]

## 2019-07-26 NOTE — Telephone Encounter (Signed)
Hershal Coria, Speech Therapist with [facility], needs verbal orders for hom speech therapy 1 x week for 4 weeks. Orders can be left on VM at [phone]

## 2019-07-27 DIAGNOSIS — G4733 Obstructive sleep apnea (adult) (pediatric): Secondary | ICD-10-CM | POA: Diagnosis not present

## 2019-07-27 DIAGNOSIS — M5126 Other intervertebral disc displacement, lumbar region: Secondary | ICD-10-CM | POA: Diagnosis not present

## 2019-07-27 DIAGNOSIS — G8929 Other chronic pain: Secondary | ICD-10-CM | POA: Diagnosis not present

## 2019-07-27 DIAGNOSIS — I69392 Facial weakness following cerebral infarction: Secondary | ICD-10-CM | POA: Diagnosis not present

## 2019-07-27 DIAGNOSIS — F411 Generalized anxiety disorder: Secondary | ICD-10-CM | POA: Diagnosis not present

## 2019-07-27 DIAGNOSIS — R1310 Dysphagia, unspecified: Secondary | ICD-10-CM | POA: Diagnosis not present

## 2019-07-27 DIAGNOSIS — I10 Essential (primary) hypertension: Secondary | ICD-10-CM | POA: Diagnosis not present

## 2019-07-27 DIAGNOSIS — M1711 Unilateral primary osteoarthritis, right knee: Secondary | ICD-10-CM | POA: Diagnosis not present

## 2019-07-27 DIAGNOSIS — F329 Major depressive disorder, single episode, unspecified: Secondary | ICD-10-CM | POA: Diagnosis not present

## 2019-07-27 NOTE — Telephone Encounter (Signed)
Please give the order.  Thanks.   

## 2019-07-28 DIAGNOSIS — R1310 Dysphagia, unspecified: Secondary | ICD-10-CM | POA: Diagnosis not present

## 2019-07-28 DIAGNOSIS — G4733 Obstructive sleep apnea (adult) (pediatric): Secondary | ICD-10-CM | POA: Diagnosis not present

## 2019-07-28 DIAGNOSIS — G8929 Other chronic pain: Secondary | ICD-10-CM | POA: Diagnosis not present

## 2019-07-28 DIAGNOSIS — F411 Generalized anxiety disorder: Secondary | ICD-10-CM | POA: Diagnosis not present

## 2019-07-28 DIAGNOSIS — I10 Essential (primary) hypertension: Secondary | ICD-10-CM | POA: Diagnosis not present

## 2019-07-28 DIAGNOSIS — F329 Major depressive disorder, single episode, unspecified: Secondary | ICD-10-CM | POA: Diagnosis not present

## 2019-07-28 DIAGNOSIS — M5126 Other intervertebral disc displacement, lumbar region: Secondary | ICD-10-CM | POA: Diagnosis not present

## 2019-07-28 DIAGNOSIS — I69392 Facial weakness following cerebral infarction: Secondary | ICD-10-CM | POA: Diagnosis not present

## 2019-07-28 DIAGNOSIS — M1711 Unilateral primary osteoarthritis, right knee: Secondary | ICD-10-CM | POA: Diagnosis not present

## 2019-07-28 NOTE — Telephone Encounter (Signed)
Left detailed message on voicemail of [name] [name] with [facility] as requested.

## 2019-08-01 DIAGNOSIS — F329 Major depressive disorder, single episode, unspecified: Secondary | ICD-10-CM | POA: Diagnosis not present

## 2019-08-01 DIAGNOSIS — G4733 Obstructive sleep apnea (adult) (pediatric): Secondary | ICD-10-CM | POA: Diagnosis not present

## 2019-08-01 DIAGNOSIS — F411 Generalized anxiety disorder: Secondary | ICD-10-CM | POA: Diagnosis not present

## 2019-08-01 DIAGNOSIS — I69392 Facial weakness following cerebral infarction: Secondary | ICD-10-CM | POA: Diagnosis not present

## 2019-08-01 DIAGNOSIS — R1310 Dysphagia, unspecified: Secondary | ICD-10-CM | POA: Diagnosis not present

## 2019-08-01 DIAGNOSIS — M1711 Unilateral primary osteoarthritis, right knee: Secondary | ICD-10-CM | POA: Diagnosis not present

## 2019-08-01 DIAGNOSIS — I10 Essential (primary) hypertension: Secondary | ICD-10-CM | POA: Diagnosis not present

## 2019-08-01 DIAGNOSIS — G8929 Other chronic pain: Secondary | ICD-10-CM | POA: Diagnosis not present

## 2019-08-01 DIAGNOSIS — M5126 Other intervertebral disc displacement, lumbar region: Secondary | ICD-10-CM | POA: Diagnosis not present

## 2019-08-02 DIAGNOSIS — G4733 Obstructive sleep apnea (adult) (pediatric): Secondary | ICD-10-CM | POA: Diagnosis not present

## 2019-08-02 DIAGNOSIS — M1711 Unilateral primary osteoarthritis, right knee: Secondary | ICD-10-CM | POA: Diagnosis not present

## 2019-08-02 DIAGNOSIS — M5126 Other intervertebral disc displacement, lumbar region: Secondary | ICD-10-CM | POA: Diagnosis not present

## 2019-08-02 DIAGNOSIS — I69392 Facial weakness following cerebral infarction: Secondary | ICD-10-CM | POA: Diagnosis not present

## 2019-08-02 DIAGNOSIS — F329 Major depressive disorder, single episode, unspecified: Secondary | ICD-10-CM | POA: Diagnosis not present

## 2019-08-02 DIAGNOSIS — G8929 Other chronic pain: Secondary | ICD-10-CM | POA: Diagnosis not present

## 2019-08-02 DIAGNOSIS — R1310 Dysphagia, unspecified: Secondary | ICD-10-CM | POA: Diagnosis not present

## 2019-08-02 DIAGNOSIS — F411 Generalized anxiety disorder: Secondary | ICD-10-CM | POA: Diagnosis not present

## 2019-08-02 DIAGNOSIS — I10 Essential (primary) hypertension: Secondary | ICD-10-CM | POA: Diagnosis not present

## 2019-08-03 DIAGNOSIS — I69392 Facial weakness following cerebral infarction: Secondary | ICD-10-CM | POA: Diagnosis not present

## 2019-08-03 DIAGNOSIS — M1711 Unilateral primary osteoarthritis, right knee: Secondary | ICD-10-CM | POA: Diagnosis not present

## 2019-08-03 DIAGNOSIS — M5126 Other intervertebral disc displacement, lumbar region: Secondary | ICD-10-CM | POA: Diagnosis not present

## 2019-08-03 DIAGNOSIS — G4733 Obstructive sleep apnea (adult) (pediatric): Secondary | ICD-10-CM | POA: Diagnosis not present

## 2019-08-03 DIAGNOSIS — I10 Essential (primary) hypertension: Secondary | ICD-10-CM | POA: Diagnosis not present

## 2019-08-03 DIAGNOSIS — F329 Major depressive disorder, single episode, unspecified: Secondary | ICD-10-CM | POA: Diagnosis not present

## 2019-08-03 DIAGNOSIS — R1310 Dysphagia, unspecified: Secondary | ICD-10-CM | POA: Diagnosis not present

## 2019-08-03 DIAGNOSIS — G8929 Other chronic pain: Secondary | ICD-10-CM | POA: Diagnosis not present

## 2019-08-03 DIAGNOSIS — F411 Generalized anxiety disorder: Secondary | ICD-10-CM | POA: Diagnosis not present

## 2019-08-04 DIAGNOSIS — M5126 Other intervertebral disc displacement, lumbar region: Secondary | ICD-10-CM | POA: Diagnosis not present

## 2019-08-04 DIAGNOSIS — R1310 Dysphagia, unspecified: Secondary | ICD-10-CM | POA: Diagnosis not present

## 2019-08-04 DIAGNOSIS — I10 Essential (primary) hypertension: Secondary | ICD-10-CM | POA: Diagnosis not present

## 2019-08-04 DIAGNOSIS — M1711 Unilateral primary osteoarthritis, right knee: Secondary | ICD-10-CM | POA: Diagnosis not present

## 2019-08-04 DIAGNOSIS — F411 Generalized anxiety disorder: Secondary | ICD-10-CM | POA: Diagnosis not present

## 2019-08-04 DIAGNOSIS — F329 Major depressive disorder, single episode, unspecified: Secondary | ICD-10-CM | POA: Diagnosis not present

## 2019-08-04 DIAGNOSIS — G8929 Other chronic pain: Secondary | ICD-10-CM | POA: Diagnosis not present

## 2019-08-04 DIAGNOSIS — G4733 Obstructive sleep apnea (adult) (pediatric): Secondary | ICD-10-CM | POA: Diagnosis not present

## 2019-08-04 DIAGNOSIS — I69392 Facial weakness following cerebral infarction: Secondary | ICD-10-CM | POA: Diagnosis not present

## 2019-08-09 DIAGNOSIS — M5126 Other intervertebral disc displacement, lumbar region: Secondary | ICD-10-CM | POA: Diagnosis not present

## 2019-08-09 DIAGNOSIS — I10 Essential (primary) hypertension: Secondary | ICD-10-CM | POA: Diagnosis not present

## 2019-08-09 DIAGNOSIS — F411 Generalized anxiety disorder: Secondary | ICD-10-CM | POA: Diagnosis not present

## 2019-08-09 DIAGNOSIS — G4733 Obstructive sleep apnea (adult) (pediatric): Secondary | ICD-10-CM | POA: Diagnosis not present

## 2019-08-09 DIAGNOSIS — M1711 Unilateral primary osteoarthritis, right knee: Secondary | ICD-10-CM | POA: Diagnosis not present

## 2019-08-09 DIAGNOSIS — G8929 Other chronic pain: Secondary | ICD-10-CM | POA: Diagnosis not present

## 2019-08-09 DIAGNOSIS — F329 Major depressive disorder, single episode, unspecified: Secondary | ICD-10-CM | POA: Diagnosis not present

## 2019-08-09 DIAGNOSIS — R1310 Dysphagia, unspecified: Secondary | ICD-10-CM | POA: Diagnosis not present

## 2019-08-09 DIAGNOSIS — I69392 Facial weakness following cerebral infarction: Secondary | ICD-10-CM | POA: Diagnosis not present

## 2019-08-10 DIAGNOSIS — I10 Essential (primary) hypertension: Secondary | ICD-10-CM | POA: Diagnosis not present

## 2019-08-10 DIAGNOSIS — M5126 Other intervertebral disc displacement, lumbar region: Secondary | ICD-10-CM | POA: Diagnosis not present

## 2019-08-10 DIAGNOSIS — F411 Generalized anxiety disorder: Secondary | ICD-10-CM | POA: Diagnosis not present

## 2019-08-10 DIAGNOSIS — R1310 Dysphagia, unspecified: Secondary | ICD-10-CM | POA: Diagnosis not present

## 2019-08-10 DIAGNOSIS — M1711 Unilateral primary osteoarthritis, right knee: Secondary | ICD-10-CM | POA: Diagnosis not present

## 2019-08-10 DIAGNOSIS — G4733 Obstructive sleep apnea (adult) (pediatric): Secondary | ICD-10-CM | POA: Diagnosis not present

## 2019-08-10 DIAGNOSIS — G8929 Other chronic pain: Secondary | ICD-10-CM | POA: Diagnosis not present

## 2019-08-10 DIAGNOSIS — F329 Major depressive disorder, single episode, unspecified: Secondary | ICD-10-CM | POA: Diagnosis not present

## 2019-08-10 DIAGNOSIS — I69392 Facial weakness following cerebral infarction: Secondary | ICD-10-CM | POA: Diagnosis not present

## 2019-08-11 DIAGNOSIS — M5136 Other intervertebral disc degeneration, lumbar region: Secondary | ICD-10-CM | POA: Diagnosis not present

## 2019-08-11 DIAGNOSIS — M1711 Unilateral primary osteoarthritis, right knee: Secondary | ICD-10-CM | POA: Diagnosis not present

## 2019-08-15 ENCOUNTER — Other Ambulatory Visit: Payer: Self-pay | Admitting: Family Medicine

## 2019-08-16 DIAGNOSIS — F411 Generalized anxiety disorder: Secondary | ICD-10-CM | POA: Diagnosis not present

## 2019-08-16 DIAGNOSIS — I10 Essential (primary) hypertension: Secondary | ICD-10-CM | POA: Diagnosis not present

## 2019-08-16 DIAGNOSIS — R1310 Dysphagia, unspecified: Secondary | ICD-10-CM | POA: Diagnosis not present

## 2019-08-16 DIAGNOSIS — G4733 Obstructive sleep apnea (adult) (pediatric): Secondary | ICD-10-CM | POA: Diagnosis not present

## 2019-08-16 DIAGNOSIS — F329 Major depressive disorder, single episode, unspecified: Secondary | ICD-10-CM | POA: Diagnosis not present

## 2019-08-16 DIAGNOSIS — M5126 Other intervertebral disc displacement, lumbar region: Secondary | ICD-10-CM | POA: Diagnosis not present

## 2019-08-16 DIAGNOSIS — G8929 Other chronic pain: Secondary | ICD-10-CM | POA: Diagnosis not present

## 2019-08-16 DIAGNOSIS — M1711 Unilateral primary osteoarthritis, right knee: Secondary | ICD-10-CM | POA: Diagnosis not present

## 2019-08-16 DIAGNOSIS — I69392 Facial weakness following cerebral infarction: Secondary | ICD-10-CM | POA: Diagnosis not present

## 2019-08-18 DIAGNOSIS — I69392 Facial weakness following cerebral infarction: Secondary | ICD-10-CM | POA: Diagnosis not present

## 2019-08-18 DIAGNOSIS — G4733 Obstructive sleep apnea (adult) (pediatric): Secondary | ICD-10-CM | POA: Diagnosis not present

## 2019-08-18 DIAGNOSIS — R1310 Dysphagia, unspecified: Secondary | ICD-10-CM | POA: Diagnosis not present

## 2019-08-18 DIAGNOSIS — M5126 Other intervertebral disc displacement, lumbar region: Secondary | ICD-10-CM | POA: Diagnosis not present

## 2019-08-18 DIAGNOSIS — M1711 Unilateral primary osteoarthritis, right knee: Secondary | ICD-10-CM | POA: Diagnosis not present

## 2019-08-18 DIAGNOSIS — G8929 Other chronic pain: Secondary | ICD-10-CM | POA: Diagnosis not present

## 2019-08-18 DIAGNOSIS — F411 Generalized anxiety disorder: Secondary | ICD-10-CM | POA: Diagnosis not present

## 2019-08-18 DIAGNOSIS — F329 Major depressive disorder, single episode, unspecified: Secondary | ICD-10-CM | POA: Diagnosis not present

## 2019-08-18 DIAGNOSIS — I10 Essential (primary) hypertension: Secondary | ICD-10-CM | POA: Diagnosis not present

## 2019-08-24 DIAGNOSIS — M5126 Other intervertebral disc displacement, lumbar region: Secondary | ICD-10-CM | POA: Diagnosis not present

## 2019-08-24 DIAGNOSIS — I10 Essential (primary) hypertension: Secondary | ICD-10-CM | POA: Diagnosis not present

## 2019-08-24 DIAGNOSIS — Z96641 Presence of right artificial hip joint: Secondary | ICD-10-CM

## 2019-08-24 DIAGNOSIS — Z96652 Presence of left artificial knee joint: Secondary | ICD-10-CM

## 2019-08-24 DIAGNOSIS — K760 Fatty (change of) liver, not elsewhere classified: Secondary | ICD-10-CM

## 2019-08-24 DIAGNOSIS — G43909 Migraine, unspecified, not intractable, without status migrainosus: Secondary | ICD-10-CM

## 2019-08-24 DIAGNOSIS — R1310 Dysphagia, unspecified: Secondary | ICD-10-CM

## 2019-08-24 DIAGNOSIS — F329 Major depressive disorder, single episode, unspecified: Secondary | ICD-10-CM

## 2019-08-24 DIAGNOSIS — E669 Obesity, unspecified: Secondary | ICD-10-CM

## 2019-08-24 DIAGNOSIS — I69898 Other sequelae of other cerebrovascular disease: Secondary | ICD-10-CM

## 2019-08-24 DIAGNOSIS — E782 Mixed hyperlipidemia: Secondary | ICD-10-CM

## 2019-08-24 DIAGNOSIS — F411 Generalized anxiety disorder: Secondary | ICD-10-CM

## 2019-08-24 DIAGNOSIS — Z9181 History of falling: Secondary | ICD-10-CM

## 2019-08-24 DIAGNOSIS — K589 Irritable bowel syndrome without diarrhea: Secondary | ICD-10-CM

## 2019-08-24 DIAGNOSIS — R4781 Slurred speech: Secondary | ICD-10-CM

## 2019-08-24 DIAGNOSIS — G4733 Obstructive sleep apnea (adult) (pediatric): Secondary | ICD-10-CM

## 2019-08-24 DIAGNOSIS — K219 Gastro-esophageal reflux disease without esophagitis: Secondary | ICD-10-CM

## 2019-08-24 DIAGNOSIS — Z8701 Personal history of pneumonia (recurrent): Secondary | ICD-10-CM

## 2019-08-24 DIAGNOSIS — I69392 Facial weakness following cerebral infarction: Secondary | ICD-10-CM | POA: Diagnosis not present

## 2019-08-24 DIAGNOSIS — G8929 Other chronic pain: Secondary | ICD-10-CM

## 2019-08-24 DIAGNOSIS — Z7982 Long term (current) use of aspirin: Secondary | ICD-10-CM

## 2019-08-24 DIAGNOSIS — M1711 Unilateral primary osteoarthritis, right knee: Secondary | ICD-10-CM | POA: Diagnosis not present

## 2019-08-24 DIAGNOSIS — Z7902 Long term (current) use of antithrombotics/antiplatelets: Secondary | ICD-10-CM

## 2019-09-01 DIAGNOSIS — G8929 Other chronic pain: Secondary | ICD-10-CM | POA: Diagnosis not present

## 2019-09-01 DIAGNOSIS — M5126 Other intervertebral disc displacement, lumbar region: Secondary | ICD-10-CM | POA: Diagnosis not present

## 2019-09-01 DIAGNOSIS — M1711 Unilateral primary osteoarthritis, right knee: Secondary | ICD-10-CM | POA: Diagnosis not present

## 2019-09-01 DIAGNOSIS — F329 Major depressive disorder, single episode, unspecified: Secondary | ICD-10-CM | POA: Diagnosis not present

## 2019-09-01 DIAGNOSIS — G4733 Obstructive sleep apnea (adult) (pediatric): Secondary | ICD-10-CM | POA: Diagnosis not present

## 2019-09-01 DIAGNOSIS — I10 Essential (primary) hypertension: Secondary | ICD-10-CM | POA: Diagnosis not present

## 2019-09-01 DIAGNOSIS — F411 Generalized anxiety disorder: Secondary | ICD-10-CM | POA: Diagnosis not present

## 2019-09-01 DIAGNOSIS — R1310 Dysphagia, unspecified: Secondary | ICD-10-CM | POA: Diagnosis not present

## 2019-09-01 DIAGNOSIS — I69392 Facial weakness following cerebral infarction: Secondary | ICD-10-CM | POA: Diagnosis not present

## 2019-09-02 ENCOUNTER — Ambulatory Visit (INDEPENDENT_AMBULATORY_CARE_PROVIDER_SITE_OTHER): Payer: Medicare HMO | Admitting: Family Medicine

## 2019-09-02 DIAGNOSIS — Z8673 Personal history of transient ischemic attack (TIA), and cerebral infarction without residual deficits: Secondary | ICD-10-CM | POA: Diagnosis not present

## 2019-09-02 DIAGNOSIS — J309 Allergic rhinitis, unspecified: Secondary | ICD-10-CM

## 2019-09-02 DIAGNOSIS — G4700 Insomnia, unspecified: Secondary | ICD-10-CM | POA: Diagnosis not present

## 2019-09-02 DIAGNOSIS — I639 Cerebral infarction, unspecified: Secondary | ICD-10-CM

## 2019-09-02 MED ORDER — HYDROXYZINE HCL 10 MG PO TABS: 5.0000 mg | ORAL_TABLET | Freq: Every evening | ORAL | 2 refills | Status: DC | PRN

## 2019-09-02 NOTE — Progress Notes (Addendum)
Virtual visit completed through WebEx or similar program Patient location: home  Provider location: Financial controller at [facility], office  Participants: Patient and me.  Pandemic considerations d/w pt.   Limitations and rationale for visit method d/w patient.  Patient agreed to proceed.   CC:  URI sx.   HPI: We talked about her situation in general.  She is making progress but she is still frustrated with the setback from the CVA.  She is using walker in the AMs but gets stronger as the day goes on.  She still has some facial/lip asymmetry but this appears to be better than prev.  Her speech sounds closer to her baseline in my estimation today.  She is careful with swallowing.  She has periodic visits with neuro re: study through that clinic.    Her knee is better but she is still putting up with hip pain.  She is trying to put off surgery.    Hydroxyzine used prn, 1/2 tab prn for insomnia with relief.  She continued on sertraline.  Hydroxyzine rx sent.    She has some L sided thorax muscle soreness likely related to strain/accomodiation after CVA.  This is in the same place, only L sided.  Noted at rest.  Chronic but note noted as much when she is active.  Tylenol helps.  She'll update me as needed.  D/w pt about trying heat as needed, she doesn't have absence of sensation that would prevent safe use of heat.    We talked about pollen load and change of season.  This is atypical for her.  She had rhinorrhea and eye watering.  She doesn't have as much trouble today.  She is already on zyrtec and flonase at baseline.  We talked about other options, ie singulair.  She had increased flonase to 2 sprays BID in each nostril.  She had increased in the meantime and that may have helped.    Meds and allergies reviewed.   ROS: Per HPI unless specifically indicated in ROS section   NAD Speech wnl  A/P:  History of CVA.  She is making some progress.  No change in meds at this point.  Continue Lipitor.  Reasonable to recheck labs and lipids at a visit with me in about 1 month.  We can call her to get that set up.    Insomnia.  Hydroxyzine used prn, 1/2 tab prn for insomnia with relief.  She continued on sertraline.  Hydroxyzine rx sent.  Continue as is.  Allergic rhinitis.  Discussed options. Would continue flonase 2 sprays BID in each nostril for now, until pollen load decreases. Routine cautions d/w pt.

## 2019-09-04 NOTE — Assessment & Plan Note (Signed)
She is making some progress.  No change in meds at this point.  Continue Lipitor. Reasonable to recheck labs and lipids at a visit with me in about 1 month.  We can call her to get that set up.

## 2019-09-04 NOTE — Assessment & Plan Note (Signed)
Hydroxyzine used prn, 1/2 tab prn for insomnia with relief.  She continued on sertraline.  Hydroxyzine rx sent.  Continue as is.

## 2019-09-04 NOTE — Assessment & Plan Note (Signed)
Discussed options. Would continue flonase 2 sprays BID in each nostril for now, until pollen load decreases. Routine cautions d/w pt.

## 2019-09-05 ENCOUNTER — Telehealth: Payer: Self-pay | Admitting: Family Medicine

## 2019-09-05 NOTE — Telephone Encounter (Signed)
Called and got pt scheduled for lab lipid check.

## 2019-09-07 ENCOUNTER — Other Ambulatory Visit (HOSPITAL_COMMUNITY): Payer: Self-pay | Admitting: Neurology

## 2019-09-07 DIAGNOSIS — I6330 Cerebral infarction due to thrombosis of unspecified cerebral artery: Secondary | ICD-10-CM

## 2019-09-08 ENCOUNTER — Encounter: Payer: Self-pay | Admitting: Family Medicine

## 2019-09-09 ENCOUNTER — Other Ambulatory Visit: Payer: Self-pay | Admitting: Family Medicine

## 2019-09-09 MED ORDER — PREDNISONE 10 MG PO TABS: ORAL_TABLET | ORAL | 0 refills | Status: DC

## 2019-09-13 DIAGNOSIS — I10 Essential (primary) hypertension: Secondary | ICD-10-CM | POA: Diagnosis not present

## 2019-09-13 DIAGNOSIS — F411 Generalized anxiety disorder: Secondary | ICD-10-CM | POA: Diagnosis not present

## 2019-09-13 DIAGNOSIS — I69392 Facial weakness following cerebral infarction: Secondary | ICD-10-CM | POA: Diagnosis not present

## 2019-09-13 DIAGNOSIS — G8929 Other chronic pain: Secondary | ICD-10-CM | POA: Diagnosis not present

## 2019-09-13 DIAGNOSIS — F329 Major depressive disorder, single episode, unspecified: Secondary | ICD-10-CM | POA: Diagnosis not present

## 2019-09-13 DIAGNOSIS — G4733 Obstructive sleep apnea (adult) (pediatric): Secondary | ICD-10-CM | POA: Diagnosis not present

## 2019-09-13 DIAGNOSIS — R1310 Dysphagia, unspecified: Secondary | ICD-10-CM | POA: Diagnosis not present

## 2019-09-13 DIAGNOSIS — M5126 Other intervertebral disc displacement, lumbar region: Secondary | ICD-10-CM | POA: Diagnosis not present

## 2019-09-13 DIAGNOSIS — M1711 Unilateral primary osteoarthritis, right knee: Secondary | ICD-10-CM | POA: Diagnosis not present

## 2019-09-14 DIAGNOSIS — Z8673 Personal history of transient ischemic attack (TIA), and cerebral infarction without residual deficits: Secondary | ICD-10-CM | POA: Insufficient documentation

## 2019-09-14 NOTE — Assessment & Plan Note (Addendum)
See above.  She is making some progress.  No change in meds at this point.  Continue Lipitor. Reasonable to recheck labs and lipids at a visit with me in about 1 month.  We can call her to get that set up.

## 2019-09-29 ENCOUNTER — Other Ambulatory Visit: Payer: Self-pay | Admitting: Family Medicine

## 2019-09-29 NOTE — Telephone Encounter (Signed)
Electronic refill request. Prednisone Last office visit:   09/02/2019 Last Filled:    15 tablet 0 09/09/2019  Please advise.

## 2019-09-30 MED ORDER — PREDNISONE 10 MG PO TABS: ORAL_TABLET | ORAL | 0 refills | Status: DC

## 2019-09-30 NOTE — Telephone Encounter (Signed)
Sent. Thanks.  Okay to restart but please get update on patient.

## 2019-09-30 NOTE — Telephone Encounter (Signed)
Patient says that she did get better previously but day before yesterday got the hoarseness, runny nose, and sneezing again.  No color to the discharge.

## 2019-10-02 NOTE — Telephone Encounter (Signed)
Noted.  Thanks.  Agree with prednisone repeat.  If not better with this then please let me know.  Thanks.

## 2019-10-07 ENCOUNTER — Other Ambulatory Visit (HOSPITAL_COMMUNITY): Payer: Medicare HMO

## 2019-10-10 ENCOUNTER — Telehealth: Payer: Self-pay | Admitting: Adult Health

## 2019-10-10 DIAGNOSIS — R29898 Other symptoms and signs involving the musculoskeletal system: Secondary | ICD-10-CM

## 2019-10-10 DIAGNOSIS — I69322 Dysarthria following cerebral infarction: Secondary | ICD-10-CM

## 2019-10-10 DIAGNOSIS — I69398 Other sequelae of cerebral infarction: Secondary | ICD-10-CM

## 2019-10-10 NOTE — Telephone Encounter (Signed)
Evaluated patient during clinical trial with residual stroke deficits of mild decrease left hand dexterity, imbalance, mild dysarthria and mild left lower facial weakness.  Recently completed home health therapies and interested in pursuing additional outpatient therapies.  Will place orders to neuro rehab PT/OT/ST for ongoing hopeful benefit and will recommend follow-up in 3 months in clinic for stroke follow-up.  She will continue to participate in clinical trial as well as follow-up with PCP for management of hypertension and hyperlipidemia as well as ongoing use of CPAP  She will call during the interval time with any questions or concerns

## 2019-10-12 ENCOUNTER — Other Ambulatory Visit: Payer: Self-pay | Admitting: Family Medicine

## 2019-10-12 DIAGNOSIS — I639 Cerebral infarction, unspecified: Secondary | ICD-10-CM

## 2019-10-13 ENCOUNTER — Other Ambulatory Visit (INDEPENDENT_AMBULATORY_CARE_PROVIDER_SITE_OTHER): Payer: Medicare HMO

## 2019-10-13 DIAGNOSIS — I639 Cerebral infarction, unspecified: Secondary | ICD-10-CM

## 2019-10-13 LAB — COMPREHENSIVE METABOLIC PANEL
ALT: 15 U/L (ref 0–35)
AST: 16 U/L (ref 0–37)
Albumin: 4.1 g/dL (ref 3.5–5.2)
Alkaline Phosphatase: 99 U/L (ref 39–117)
BUN: 13 mg/dL (ref 6–23)
CO2: 30 mEq/L (ref 19–32)
Calcium: 9.5 mg/dL (ref 8.4–10.5)
Chloride: 105 mEq/L (ref 96–112)
Creatinine, Ser: 0.86 mg/dL (ref 0.40–1.20)
GFR: 63.97 mL/min (ref >60.00)
Glucose, Bld: 95 mg/dL (ref 70–99)
Potassium: 3.8 mEq/L (ref 3.5–5.1)
Sodium: 141 mEq/L (ref 135–145)
Total Bilirubin: 0.6 mg/dL (ref 0.2–1.2)
Total Protein: 6.3 g/dL (ref 6.0–8.3)

## 2019-10-13 LAB — CBC WITH DIFFERENTIAL/PLATELET
Basophils Absolute: 0.1 10*3/uL (ref 0.0–0.1)
Basophils Relative: 0.7 % (ref 0.0–3.0)
Eosinophils Absolute: 0.3 10*3/uL (ref 0.0–0.7)
Eosinophils Relative: 3.6 % (ref 0.0–5.0)
HCT: 41.7 % (ref 36.0–46.0)
Hemoglobin: 14 g/dL (ref 12.0–15.0)
Lymphocytes Relative: 26.4 % (ref 12.0–46.0)
Lymphs Abs: 2.3 10*3/uL (ref 0.7–4.0)
MCHC: 33.7 g/dL (ref 30.0–36.0)
MCV: 95.2 fl (ref 78.0–100.0)
Monocytes Absolute: 0.8 10*3/uL (ref 0.1–1.0)
Monocytes Relative: 9.5 % (ref 3.0–12.0)
Neutro Abs: 5.2 10*3/uL (ref 1.4–7.7)
Neutrophils Relative %: 59.8 % (ref 43.0–77.0)
Platelets: 251 10*3/uL (ref 150.0–400.0)
RBC: 4.38 Mil/uL (ref 3.87–5.11)
RDW: 13.2 % (ref 11.5–15.5)
WBC: 8.7 10*3/uL (ref 4.0–10.5)

## 2019-10-13 LAB — LIPID PANEL
Cholesterol: 129 mg/dL (ref 0–200)
HDL: 42 mg/dL (ref >39.00)
LDL Cholesterol: 64 mg/dL (ref 0–99)
NonHDL: 86.66
Total CHOL/HDL Ratio: 3
Triglycerides: 114 mg/dL (ref 0.0–149.0)
VLDL: 22.8 mg/dL (ref 0.0–40.0)

## 2019-10-20 DIAGNOSIS — G4733 Obstructive sleep apnea (adult) (pediatric): Secondary | ICD-10-CM | POA: Diagnosis not present

## 2019-10-28 ENCOUNTER — Other Ambulatory Visit: Payer: Self-pay

## 2019-10-28 ENCOUNTER — Encounter: Payer: Self-pay | Admitting: Family Medicine

## 2019-10-28 ENCOUNTER — Ambulatory Visit (INDEPENDENT_AMBULATORY_CARE_PROVIDER_SITE_OTHER): Payer: Medicare HMO | Admitting: Family Medicine

## 2019-10-28 VITALS — BP 130/82 | HR 71 | Temp 96.9°F | Ht 60.0 in | Wt 180.1 lb

## 2019-10-28 DIAGNOSIS — J309 Allergic rhinitis, unspecified: Secondary | ICD-10-CM | POA: Diagnosis not present

## 2019-10-28 DIAGNOSIS — M2550 Pain in unspecified joint: Secondary | ICD-10-CM

## 2019-10-28 DIAGNOSIS — K439 Ventral hernia without obstruction or gangrene: Secondary | ICD-10-CM | POA: Diagnosis not present

## 2019-10-28 DIAGNOSIS — R499 Unspecified voice and resonance disorder: Secondary | ICD-10-CM | POA: Diagnosis not present

## 2019-10-28 DIAGNOSIS — Z8673 Personal history of transient ischemic attack (TIA), and cerebral infarction without residual deficits: Secondary | ICD-10-CM | POA: Diagnosis not present

## 2019-10-28 NOTE — Progress Notes (Signed)
This visit occurred during the SARS-CoV-2 public health emergency.  Safety protocols were in place, including screening questions prior to the visit, additional usage of staff PPE, and extensive cleaning of exam room while observing appropriate contact time as indicated for disinfecting solutions.  Prev labs d/w pt.  Cmet/cbc/lipids at goal.  She is working on weight loss.  Prev CVA d/w pt.  She doesn't feel like her balance is back to normal when not on flat ground (could be from knee pain vs cva vs both).  She is using the walker in the AM but not later in the day.  Cautions discussed with patient.  She is going to go to rehab for outpatient therapy starting next week.  She still has some L sided weakness.    She has occ L 5th finger quivering.  That is the side that was affected by the CVA and I presume it to be related, since it started after the CVA.    She has some hoarseness with a lot of talking.  She has been taking TUMS at baseline.  Off PPI recently.  Unclear if from CVA, allergies this spring, vs GERD.  Discussed.  Her allergies are better after prednisone course.    Lump in upper abd noted by patient.  Recently noted after hospital discharge.  Upper midine, more prominent on standing.    She had been taking glycosamine chondroitin w/o relief.  She was asking about taking tumeric but I wanted her to check with neurology first.  She has her knee injected about every 3 months.  She has called about follow up for that and her hip pain.    Meds, vitals, and allergies reviewed.   ROS: Per HPI unless specifically indicated in ROS section   GEN: nad, alert and oriented HEENT: ncat, slight L facial droop.   NECK: supple w/o LA CV: rrr. PULM: ctab, no inc wob ABD: soft, +bs, she has a soft mass in the upper midline of the abdomen that feels like it moves upon standing I question if she has a ventral hernia. EXT: no edema SKIN: no acute rash  At least 30 minutes were devoted to patient  care in this encounter (this can potentially include time spent reviewing the patient's file/history, interviewing and examining the patient, counseling/reviewing plan with patient, ordering referrals, ordering tests, reviewing relevant laboratory or x-ray data, and documenting the encounter).

## 2019-10-28 NOTE — Patient Instructions (Addendum)
We'll set you up with general surgery about the likely hernia.   Keep the appointment with neurology about the therapy.   Ask [name] about your back pain.   Let me see about getting an ENT appointment with Dr. Tami Ribas about your voice.  Take care.  Glad to see you.

## 2019-10-30 DIAGNOSIS — K439 Ventral hernia without obstruction or gangrene: Secondary | ICD-10-CM | POA: Insufficient documentation

## 2019-10-30 DIAGNOSIS — R499 Unspecified voice and resonance disorder: Secondary | ICD-10-CM | POA: Insufficient documentation

## 2019-10-30 NOTE — Assessment & Plan Note (Signed)
She had been taking glycosamine chondroitin w/o relief.  She was asking about taking tumeric but I wanted her to check with neurology first.  She has her knee injected about every 3 months.  She has called about follow up for that and her hip pain.

## 2019-10-30 NOTE — Assessment & Plan Note (Signed)
Likely, soft, okay for outpatient follow-up.  Refer to general surgery.  Discussed.  She agrees.

## 2019-10-30 NOTE — Assessment & Plan Note (Signed)
Her allergies are better after prednisone course.

## 2019-10-30 NOTE — Assessment & Plan Note (Signed)
Unclear if from CVA, allergies this spring, vs GERD.  Discussed.  I would like ENT input.  Refer.

## 2019-10-30 NOTE — Assessment & Plan Note (Signed)
No change in meds at this point.  Fall cautions discussed with patient.  She is going to follow-up with therapy.

## 2019-11-02 ENCOUNTER — Ambulatory Visit: Payer: Medicare HMO | Attending: Family Medicine | Admitting: Speech Pathology

## 2019-11-02 ENCOUNTER — Other Ambulatory Visit: Payer: Self-pay

## 2019-11-02 ENCOUNTER — Ambulatory Visit: Payer: Medicare HMO

## 2019-11-02 DIAGNOSIS — R2681 Unsteadiness on feet: Secondary | ICD-10-CM

## 2019-11-02 DIAGNOSIS — R262 Difficulty in walking, not elsewhere classified: Secondary | ICD-10-CM | POA: Insufficient documentation

## 2019-11-02 DIAGNOSIS — R2689 Other abnormalities of gait and mobility: Secondary | ICD-10-CM | POA: Insufficient documentation

## 2019-11-02 DIAGNOSIS — M6281 Muscle weakness (generalized): Secondary | ICD-10-CM | POA: Insufficient documentation

## 2019-11-02 DIAGNOSIS — R498 Other voice and resonance disorders: Secondary | ICD-10-CM | POA: Diagnosis not present

## 2019-11-02 DIAGNOSIS — R471 Dysarthria and anarthria: Secondary | ICD-10-CM | POA: Diagnosis not present

## 2019-11-02 NOTE — Patient Instructions (Signed)
    Do exercises 20x each, 3x a day - in the mirror, slow and big  1. Alternate pucker and smile - OOO-EEE  2. Open mouth big Ahh-OOO with mouth open big  3. Pucker and move your lips side to side  4. Puff up your cheeks with air BIG - swish air from side to side  5. Press lips together flat and pop them open   6. Pucker and kiss big   Don't eat 3 hours before bed  Don't lie down for 1 hour after eating  Avoid bending over or exercising after eating  Chewing gum for 20 minutes after meals may be helpful  Drink warm fluids with meals (warm decaffinated tea)  ENT at [place] them know that you will continue speech therapy in [facility] - Dr. Rowe Clack or Dr. Joya Gaskins  Ask Dr. Damita Dunnings for a referral to [facility] - once they put it in, call [facility] voice lab and make an appointment [phone]

## 2019-11-02 NOTE — Therapy (Signed)
[facility] [address] [place], [place], [address] Phone: [phone]   Fax:  [phone]  Speech Language Pathology Evaluation  Patient Details  Name: [name] [name] MRN: [id number] Date of Birth: 03-31-1941 Referring Provider (SLP): Frann Rider, NP   Encounter Date: 11/02/2019  End of Session - 11/02/19 1301    Visit Number  1    Number of Visits  17    Date for SLP Re-Evaluation  12/28/19    SLP Start Time  0932    SLP Stop Time   4097    SLP Time Calculation (min)  43 min    Activity Tolerance  Patient tolerated treatment well       Past Medical History:  Diagnosis Date  . Arthritis    knees  . Depression   . Fatty liver   . GERD (gastroesophageal reflux disease)   . Hyperlipidemia   . Hypertension   . IBS (irritable bowel syndrome)   . Obesity   . Pneumonia   . Sleep apnea    uses CPAP    Past Surgical History:  Procedure Laterality Date  . BLADDER SURGERY    . BREAST SURGERY     breast biopsy  . BROW LIFT Bilateral 04/14/2017   Procedure: BLEPHAROPLASTY UPPER EYELID WITH EXCESS SKIN;  Surgeon: Karle Starch, MD;  Location: [facility];  Service: Ophthalmology;  Laterality: Bilateral;  . CATARACT EXTRACTION W/PHACO Left 02/05/2016   Procedure: CATARACT EXTRACTION PHACO AND INTRAOCULAR LENS PLACEMENT (IOC);  Surgeon: Birder Robson, MD;  Location: [facility];  Service: Ophthalmology;  Laterality: Left;  [place] 01:10 AP% 22.3 CDE 15.67 Fluid pack lot # [id number]  . CATARACT EXTRACTION W/PHACO Right 02/26/2016   Procedure: CATARACT EXTRACTION PHACO AND INTRAOCULAR LENS PLACEMENT (IOC);  Surgeon: Birder Robson, MD;  Location: [facility];  Service: Ophthalmology;  Laterality: Right;  [place] 57.4 AP% 24.0 CDE 13.75 Fluid Pack lot # [id number]  . CHOLECYSTECTOMY    . COLONOSCOPY WITH PROPOFOL N/A 12/18/2014   Procedure: COLONOSCOPY WITH PROPOFOL;  Surgeon: Manya Silvas, MD;  Location: [facility]  ENDOSCOPY;  Service: Endoscopy;  Laterality: N/A;  . DEEP NECK LYMPH NODE BIOPSY / EXCISION    . JOINT REPLACEMENT    . KNEE ARTHROPLASTY Left 06/20/2015   Procedure: COMPUTER ASSISTED TOTAL KNEE ARTHROPLASTY;  Surgeon: Dereck Leep, MD;  Location: [facility];  Service: Orthopedics;  Laterality: Left;  . PTOSIS REPAIR Bilateral 04/14/2017   Procedure: PTOSIS REPAIR RESECT EX;  Surgeon: Karle Starch, MD;  Location: [facility];  Service: Ophthalmology;  Laterality: Bilateral;  sleep apnea  . TONSILLECTOMY    . TOTAL HIP ARTHROPLASTY Right 07/21/2018   Procedure: TOTAL HIP ARTHROPLASTY ANTERIOR APPROACH;  Surgeon: Gaynelle Arabian, MD;  Location: [facility];  Service: Orthopedics;  Laterality: Right;    There were no vitals filed for this visit.  Subjective Assessment - 11/02/19 0945    Subjective  "I have been hoarse since the stroke"         SLP Evaluation [facility] - 11/02/19 0946      SLP Visit Information   SLP Received On  11/02/19    Referring Provider (SLP)  Frann Rider, NP    Onset Date  07/11/19    Medical Diagnosis  CVA      Subjective   Patient/Family Stated Goal  "To sound like myself and not be hoarse"      General Information   HPI  [name] [name]  a78 y.o.femalewith medical history significant ofhypertension, hyperlipidemia, chronic back pain, sleep apneaon CPAP at nightwho presents to the emergencydepartmentfrom home with complaint of weaknessof bilateral lower extremities, slurred speech.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 Neurology wasconsulted. Hopsitalized 2/15 to 07/15/19. Received HHST for facial weakness.     Mobility Status  walks independently      Balance Screen   Has the patient fallen in the past 6 months  Yes    How many times?  2x    Has the patient had a decrease in activity level because of a fear of falling?   Yes    Is the patient reluctant to leave their home because of a fear of falling?    No      Prior Functional Status   Cognitive/Linguistic Baseline  --   Acute SLP noted daughter reports some memory changes s/p CVA   Type of Home  Other(Comment)   condo    Lives With  Alone    Available Support  Family    Vocation  Retired      Associate Professor   Overall Cognitive Status  No family/caregiver present to determine baseline cognitive functioning      Auditory Comprehension   Overall Auditory Comprehension  Appears within functional limits for tasks assessed      Reading Comprehension   Reading Status  Not tested      Verbal Expression   Overall Verbal Expression  Appears within functional limits for tasks assessed      Written Expression   Dominant Hand  Right    Written Expression  Not tested      Oral Motor/Sensory Function   Overall Oral Motor/Sensory Function  Impaired    Labial ROM  Reduced left    Labial Symmetry  Abnormal symmetry left    Labial Strength  Reduced Left    Labial Sensation  Reduced Left    Labial Coordination  Reduced    Lingual ROM  Within Functional Limits   deviates Left upon protrusion   Lingual Symmetry  Within Functional Limits    Lingual Strength  Within Functional Limits    Lingual Coordination  WFL    Facial ROM  Within Functional Limits    Velum  Within Functional Limits    Mandible  Within Functional Limits      Motor Speech   Overall Motor Speech  Impaired    Respiration  Within functional limits    Phonation  Hoarse    Resonance  Within functional limits    Articulation  Impaired    Level of Impairment  Conversation    Intelligibility  Intelligible    Motor Planning  Witnin functional limits    Effective Techniques  Slow rate;Over-articulate                      SLP Education - 11/02/19 1300    Education Details  HEP for labial weakness, LPR precautions; agree with PCP rec for ENT    Person(s) Educated  Patient    Methods  Explanation;Verbal cues;Handout    Comprehension  Returned  demonstration;Verbal cues required;Need further instruction       SLP Short Term Goals - 11/02/19 1317      SLP SHORT TERM GOAL #1   Title  Pt will complete HEP for labial weakness with mod I over 2 sessions    Time  4    Period  Weeks    Status  New      SLP SHORT TERM GOAL #2   Title  Pt will produce sibalant fricatives accurately at sentence level with rare min A 18/20 sentences    Time  4    Period  Weeks    Status  New      SLP SHORT TERM GOAL #3   Title  Pt will follow 3 reflux precautions over 3 sessions with mod I    Time  4    Period  Weeks    Status  New      SLP SHORT TERM GOAL #4   Title  Pt will follow 3 vocal hygiene strategies over 3 sessions with mod I    Baseline  voice goals to be modified pending ENT consult    Time  4    Period  Weeks    Status  New       SLP Long Term Goals - 11/02/19 1319      SLP LONG TERM GOAL #1   Title  Pt will produce sibalant fricatives accurately in simple conversation over 15 minutes, self corrected as needed, with occasional min A over 2 session    Time  8    Period  Weeks    Status  New      SLP LONG TERM GOAL #2   Title  Pt will achieve WNL phonation using strategies 15/20 sentences with occasional min A    Baseline  may be modified following ENT consult    Time  8    Period  Weeks    Status  New      SLP LONG TERM GOAL #3   Title  Pt will follow 4 vocal hygiene strategies over 3 sessions with rare min A    Time  8    Period  Weeks    Status  New      SLP LONG TERM GOAL #4   Title  Pt will improve Communicative Effectiveness Survey score by 2 points    Time  8    Period  Weeks    Status  New       Plan - 11/02/19 1301    Clinical Impression Statement  Ala Kratz is referred for outpt ST following a CVA 07/11/19. She is referred for ongoing speech and voice impairments. Family not present. Today, Zaylei presents with mild dysathria and moderate dysphonia.  Lavine c/o a hoarse voice which she feels has  worsened since her CVA and difficulty pronouncing /s/, /z/. She affirms long h/o of reflux but is not taking reflux meds consistently. She chews Tums throughout the day. Vidhi also reports feeling excess saliva in her mouth and throat, but denies drooling. Glottal Function Index is 17 (WNL is <4). Reflux Symptom Index (RSI) is 36 (<10-13 is WNL). Voice Handicap Index is 55, indicating a moderate amount of handicap. She also reports h/o hernia. MBSS 06/2019 also revealed possible esophageal dysphagia with pill. According to [facility], her PCP is recommending ENT consult. 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 Speech is intelligible with distortion of sibalent fricatives. Caretha reports being self conscious on phone calls and when talking with strangers. Her Communicative Effectiveness Survey score was 10, with reduced effectiveness speaking with strangers, speaking over the phone, speaking in a noisy place, and speaking when emotionally upset. She rated herself not effective having a conversation at a distance. Conitive impairments not appreciated this visits. Ezri reports carryover of safety strategies at home, is driving  and verbalizes anticipatory awareness of balance impairments. Her daughter reports some memory changes after the CVA in February, so cognitive tesing may be appropriate if indicated.  She denies pharyngeal dysphagia, however she does have left pocketing and uses finger sweep to clear her mouth during meals. She is not avoiding any foods. I recommend skilled ST to maximize intelligilbity and QOL. Goals will be modified after ENT consult. Initiated training in HEP for facial weakness, education re: LPR.    Speech Therapy Frequency  2x / week    Duration  --   8 weeks or 17 visits   Treatment/Interventions  Aspiration precaution training;Environmental controls;Cueing hierarchy;Oral motor exercises;SLP instruction and feedback;Compensatory strategies;Functional tasks;Cognitive  reorganization;Compensatory techniques;Diet toleration management by SLP;Internal/external aids;Multimodal communcation approach;Patient/family education       Patient will benefit from skilled therapeutic intervention in order to improve the following deficits and impairments:   Dysarthria and anarthria  Other voice and resonance disorders    Problem List Patient Active Problem List   Diagnosis Date Noted  . Abdominal wall hernia 10/30/2019  . Change in voice 10/30/2019  . History of stroke 09/14/2019  . Spinal stenosis 07/24/2019  . Mixed hyperlipidemia   . OSA (obstructive sleep apnea)   . Dysfunction of eustachian tube 05/15/2019  . Other social stressor 03/29/2019  . 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 Diarrhea 06/20/2018  . Joint pain 06/20/2018  . 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 Medicare annual wellness visit, subsequent 09/15/2017  . SUI (stress urinary incontinence, female) 08/07/2017  . Back pain 06/16/2016  . Advance care planning 01/07/2016  . Sleep apnea   . 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 Allergic rhinitis 11/24/2014  . Adult BMI 30+ 11/24/2014  . Depression 11/24/2014  . Insomnia 08/23/2009  . Headache, migraine 05/10/2009  . HERPES SIMPLEX INFECTION 08/14/2006  . HYPERCHOLESTEROLEMIA 08/14/2006  . Anxiety state 08/14/2006  . Essential hypertension 08/14/2006  . HIATAL HERNIA 08/14/2006  . IRRITABLE BOWEL SYNDROME 08/14/2006  . 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 Primary osteoarthritis of right knee 08/14/2006  . PLANTAR FASCIITIS 08/14/2006    Jeydan Barner, Annye Rusk MS, CCC-SLP 11/02/2019, 2:06 PM  [facility] [address] [place], [place], [address] Phone: [phone]   Fax:  [phone]  Name: JENNESIS RAMASWAMY MRN: [id number] Date of Birth: Apr 19, 1941

## 2019-11-02 NOTE — Therapy (Signed)
[facility] [address] [place], [place], [address] Phone: [phone]   Fax:  [phone]  Physical Therapy Evaluation  Patient Details  Name: [name] [name] MRN: [id number] Date of Birth: Dec 04, 1940 Referring Provider (PT): Referred by Frann Rider, NP, PCP is Elsie Stain, MD    Encounter Date: 11/02/2019  PT End of Session - 11/02/19 1308    Visit Number  1    Number of Visits  13    Date for PT Re-Evaluation  01/31/20   POC for 6 weeks, Cert for 90 days   Authorization Type  Medicare    PT Start Time  1020   finishing up with ST   PT Stop Time  1059    PT Time Calculation (min)  39 min    Equipment Utilized During Treatment  Gait belt    Activity Tolerance  Patient tolerated treatment well    Behavior During Therapy  [facility] for tasks assessed/performed       Past Medical History:  Diagnosis Date  . Arthritis    knees  . Depression   . Fatty liver   . GERD (gastroesophageal reflux disease)   . Hyperlipidemia   . Hypertension   . IBS (irritable bowel syndrome)   . Obesity   . Pneumonia   . Sleep apnea    uses CPAP    Past Surgical History:  Procedure Laterality Date  . BLADDER SURGERY    . BREAST SURGERY     breast biopsy  . BROW LIFT Bilateral 04/14/2017   Procedure: BLEPHAROPLASTY UPPER EYELID WITH EXCESS SKIN;  Surgeon: Karle Starch, MD;  Location: [place];  Service: Ophthalmology;  Laterality: Bilateral;  . CATARACT EXTRACTION W/PHACO Left 02/05/2016   Procedure: CATARACT EXTRACTION PHACO AND INTRAOCULAR LENS PLACEMENT (IOC);  Surgeon: Birder Robson, MD;  Location: [facility];  Service: Ophthalmology;  Laterality: Left;  [place] 01:10 AP% 22.3 CDE 15.67 Fluid pack lot # [id number]  . CATARACT EXTRACTION W/PHACO Right 02/26/2016   Procedure: CATARACT EXTRACTION PHACO AND INTRAOCULAR LENS PLACEMENT (IOC);  Surgeon: Birder Robson, MD;  Location: [facility];  Service: Ophthalmology;   Laterality: Right;  [place] 57.4 AP% 24.0 CDE 13.75 Fluid Pack lot # [id number]  . CHOLECYSTECTOMY    . COLONOSCOPY WITH PROPOFOL N/A 12/18/2014   Procedure: COLONOSCOPY WITH PROPOFOL;  Surgeon: Manya Silvas, MD;  Location: [facility] ENDOSCOPY;  Service: Endoscopy;  Laterality: N/A;  . DEEP NECK LYMPH NODE BIOPSY / EXCISION    . JOINT REPLACEMENT    . KNEE ARTHROPLASTY Left 06/20/2015   Procedure: COMPUTER ASSISTED TOTAL KNEE ARTHROPLASTY;  Surgeon: Dereck Leep, MD;  Location: [facility];  Service: Orthopedics;  Laterality: Left;  . PTOSIS REPAIR Bilateral 04/14/2017   Procedure: PTOSIS REPAIR RESECT EX;  Surgeon: Karle Starch, MD;  Location: [place];  Service: Ophthalmology;  Laterality: Bilateral;  sleep apnea  . TONSILLECTOMY    . TOTAL HIP ARTHROPLASTY Right 07/21/2018   Procedure: TOTAL HIP ARTHROPLASTY ANTERIOR APPROACH;  Surgeon: Gaynelle Arabian, MD;  Location: [facility];  Service: Orthopedics;  Laterality: Right;    There were no vitals filed for this visit.   Subjective Assessment - 11/02/19 1023    Subjective  Patient reports that she had a stroke on 07/11/19, reports she had a facial droop and dysarthria. Reports she discharged home with home health therapy services. She finished home health therapy in April. Reports she still doesn't feel back to herself. Reports she is  doing everything at home by herself. She does have pain in right knee and back pain that was occuring prior to stroke. Patient reports she has pain in bilateral buttocks, takes her several hours to loosen up. Also reports that she feels her balance is off, and some core weakness.    Pertinent History  Obesity, hypertension, hyperlipidemia, Lt sciatic nerve pain, sleep apnea on CPAP.    Limitations  Walking;Standing;Sitting    Patient Stated Goals  Feel more like myself    Currently in Pain?  No/denies         [facility] PT Assessment - 11/02/19 1031      Assessment   Medical Diagnosis  CVA    Referring Provider  (PT)  Referred by Frann Rider, NP, PCP is Elsie Stain, MD     Onset Date/Surgical Date  07/11/19   date of CVA   Hand Dominance  Right    Prior Therapy  Outpatient therapy for LBP      Precautions   Precautions  Fall      Balance Screen   Has the patient fallen in the past 6 months  Yes    How many times?  2    Has the patient had a decrease in activity level because of a fear of falling?   Yes    Is the patient reluctant to leave their home because of a fear of falling?   Yes      [place] residence    Living Arrangements  Alone    Available Help at Discharge  Family;Friend(s)    Type of [place] Access  Level entry    [place] - single point;Walker - 2 wheels;Grab bars - toilet;Grab bars - tub/shower;Shower seat;Hand held shower head      Prior Function   Level of Independence  Independent    Vocation  Retired    Paramedic, [name], Traveling      Cognition   Overall Cognitive Status  Within Functional Limits for tasks assessed   no family/caregiver present to determine baseline     Sensation   Light Touch  Appears Intact      Coordination   Gross Motor Movements are Fluid and Coordinated  Yes   with completion of toe taps     Posture/Postural Control   Posture/Postural Control  Postural limitations    Postural Limitations  Rounded Shoulders;Forward head      ROM / Strength   AROM / PROM / Strength  AROM;Strength      AROM   Overall AROM   Within functional limits for tasks performed    Overall AROM Comments  WFL for BLE's with tasks    AROM Assessment Site  Hip;Knee;Ankle    Right/Left Hip  --    Right/Left Knee  --    Right/Left Ankle  --      Strength   Overall Strength  Deficits    Overall Strength Comments  patient reports pain with MMT of RLE due to hip/knee pain that was chronic prior to stroke    Strength Assessment Site  Hip;Knee;Ankle     Right/Left Hip  Right;Left    Right Hip Flexion  4-/5    Right Hip ABduction  4+/5    Right Hip ADduction  4+/5    Left Hip Flexion  4-/5    Left  Hip ABduction  4+/5    Left Hip ADduction  4+/5    Right/Left Knee  Right;Left    Right Knee Flexion  4+/5    Right Knee Extension  4/5    Left Knee Flexion  4+/5    Left Knee Extension  4+/5    Right/Left Ankle  Right;Left    Right Ankle Dorsiflexion  4-/5    Left Ankle Dorsiflexion  4/5      Transfers   Transfers  Sit to Stand;Stand to Sit    Sit to Stand  6: Modified independent (Device/Increase time)    Stand to Sit  6: Modified independent (Device/Increase time)    Comments  Patient reports pain in hip with completion of sit <> stand. With 30 second chair stand test, pt able to complete 10 sit <> stands w/ UE support on knees.       Ambulation/Gait   Ambulation/Gait  Yes    Ambulation/Gait Assistance  5: Supervision    Ambulation/Gait Assistance Details  supv for safety    Ambulation Distance (Feet)  200 Feet    Assistive device  None    Gait Pattern  Step-through pattern;Decreased arm swing - right;Decreased arm swing - left;Decreased dorsiflexion - right;Decreased dorsiflexion - left;Poor foot clearance - left;Ataxic    Ambulation Surface  Level;Indoor    Gait velocity  14.72 secs = 2.22 ft/sec      Standardized Balance Assessment   Standardized Balance Assessment  Timed Up and Go Test      Timed Up and Go Test   TUG  Normal TUG    Normal TUG (seconds)  10.97      Functional Gait  Assessment   Gait assessed   Yes    Gait Level Surface  Walks 20 ft in less than 7 sec but greater than 5.5 sec, uses assistive device, slower speed, mild gait deviations, or deviates 6-10 in outside of the 12 in walkway width.    Change in Gait Speed  Able to change speed, demonstrates mild gait deviations, deviates 6-10 in outside of the 12 in walkway width, or no gait deviations, unable to achieve a major change in velocity, or uses a change in  velocity, or uses an assistive device.    Gait with Horizontal Head Turns  Performs head turns smoothly with slight change in gait velocity (eg, minor disruption to smooth gait path), deviates 6-10 in outside 12 in walkway width, or uses an assistive device.    Gait with Vertical Head Turns  Performs task with slight change in gait velocity (eg, minor disruption to smooth gait path), deviates 6 - 10 in outside 12 in walkway width or uses assistive device    Gait and Pivot Turn  Pivot turns safely within 3 sec and stops quickly with no loss of balance.    Step Over Obstacle  Is able to step over one shoe box (4.5 in total height) without changing gait speed. No evidence of imbalance.    Gait with Narrow Base of Support  Ambulates 4-7 steps.    Gait with Eyes Closed  Walks 20 ft, slow speed, abnormal gait pattern, evidence for imbalance, deviates 10-15 in outside 12 in walkway width. Requires more than 9 sec to ambulate 20 ft.    Ambulating Backwards  Walks 20 ft, slow speed, abnormal gait pattern, evidence for imbalance, deviates 10-15 in outside 12 in walkway width.    Steps  Two feet to a stair, must use rail.  Total Score  17    FGA comment:  17/30 = High Fall Risk                  Objective measurements completed on examination: See above findings.              PT Education - 11/02/19 1030    Education Details  Patient educated on evaluation findings and POC    Person(s) Educated  Patient    Methods  Explanation    Comprehension  Verbalized understanding       PT Short Term Goals - 11/02/19 1329      PT SHORT TERM GOAL #1   Title  Patient will be independent with Initial HEP    Time  3    Period  Weeks    Status  New    Target Date  11/23/19      PT SHORT TERM GOAL #2   Title  Patient will improve 30 second chair stand test to >/= 12 reps w/ UE support to demo improved BLE strength    Baseline  10 sit <> stands    Time  3    Period  Weeks    Status   New    Target Date  11/23/19      PT SHORT TERM GOAL #3   Title  Patient will improve FGA to >/= 20/30 to demonstrate improved balance and reduced risk for falls    Baseline  17/30    Time  3    Period  Weeks    Status  New    Target Date  11/23/19      PT SHORT TERM GOAL #4   Title  Patient will demonstrate ability to ambulate >/= 500 ft w/o AD to demonstrate improved community mobility and ability to ambulate within neighborhood    Time  3    Period  Weeks    Status  New    Target Date  11/23/19        PT Long Term Goals - 11/02/19 1332      PT LONG TERM GOAL #1   Title  Pt will be independent with Final HEP    Time  6    Period  Weeks    Status  New    Target Date  12/14/19      PT LONG TERM GOAL #2   Title  Patient will improve gait speed to >/= 3.0 ft/sec to demonstrate improved community mobility    Baseline  2.2 ft/sec    Time  6    Period  Weeks    Status  New    Target Date  12/14/19      PT LONG TERM GOAL #3   Title  Patient will improve FGA to >/= 24/30 to demonstrate reduced risk for falls    Baseline  17/30    Time  6    Period  Weeks    Status  New    Target Date  12/14/19      PT LONG TERM GOAL #4   Title  Patient will demonstrate ability to complete TUG <9 secs w/o AD to demonstrate improved mobility    Baseline  10.97    Time  6    Period  Weeks    Status  New    Target Date  12/14/19      PT LONG TERM GOAL #5   Title  Patient will demonstrate ability to ambulate >/=  600 ft outdoors on unlevel surfaces, Mod I, to demonstrate improved ability to ambulate around neighborhood    Time  6    Period  Weeks    Status  New    Target Date  12/14/19             Plan - 11/02/19 1311    Clinical Impression Statement  Patient is a 79 y.o. female referred to Neuro OPPT due to CVA that occurred in 06/2019. Patient PMH is significant for: Obesity, hypertension, hyperlipidemia, Lt sciatic nerve pain, and sleep apnea on CPAP. With completion of FGA,  patient scored a 17/30, demonstrating difficulty with dynamic gait activities. Upon evaluation patient presented with: abnormal gait, decreased strength, pain, impaired balance, increased fall risk and overall impaired mobility. Patient reporting pain in RLE and Back during evaluation but reports this has been chronic issue present prior to CVA. Patient will benefit from skilled PT services to address deficits and improve functional mobility.    Personal Factors and Comorbidities  Comorbidity 3+    Comorbidities  Obesity, hypertension, hyperlipidemia, Lt sciatic nerve pain, sleep apnea on CPAP    Examination-Activity Limitations  Squat;Transfers;Stairs;Stand    Examination-Participation Restrictions  Community Activity;Cleaning    Stability/Clinical Decision Making  Evolving/Moderate complexity    Clinical Decision Making  Moderate    Rehab Potential  Good    PT Frequency  2x / week    PT Duration  6 weeks    PT Treatment/Interventions  ADLs/Self Care Home Management;Moist Heat;Cryotherapy;Electrical Stimulation;DME Instruction;Gait training;Stair training;Functional mobility training;Therapeutic activities;Therapeutic exercise;Balance training;Neuromuscular re-education;Patient/family education;Orthotic Fit/Training;Passive range of motion    PT Next Visit Plan  Initiate HEP, Static Balance Activities in Corner    Consulted and Agree with Plan of Care  Patient       Patient will benefit from skilled therapeutic intervention in order to improve the following deficits and impairments:  Abnormal gait, Decreased balance, Decreased endurance, Difficulty walking, Decreased activity tolerance, Decreased safety awareness, Pain, Postural dysfunction, Decreased strength  Visit Diagnosis: Muscle weakness (generalized)  Other abnormalities of gait and mobility  Difficulty in walking, not elsewhere classified  Unsteadiness on feet     Problem List Patient Active Problem List   Diagnosis Date  Noted  . Abdominal wall hernia 10/30/2019  . Change in voice 10/30/2019  . History of stroke 09/14/2019  . Spinal stenosis 07/24/2019  . Mixed hyperlipidemia   . OSA (obstructive sleep apnea)   . Dysfunction of eustachian tube 05/15/2019  . Other social stressor 03/29/2019  . 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 Diarrhea 06/20/2018  . Joint pain 06/20/2018  . 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 Medicare annual wellness visit, subsequent 09/15/2017  . SUI (stress urinary incontinence, female) 08/07/2017  . Back pain 06/16/2016  . Advance care planning 01/07/2016  . Sleep apnea   . 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 Allergic rhinitis 11/24/2014  . Adult BMI 30+ 11/24/2014  . Depression 11/24/2014  . Insomnia 08/23/2009  . Headache, migraine 05/10/2009  . HERPES SIMPLEX INFECTION 08/14/2006  . HYPERCHOLESTEROLEMIA 08/14/2006  . Anxiety state 08/14/2006  . Essential hypertension 08/14/2006  . HIATAL HERNIA 08/14/2006  . IRRITABLE BOWEL SYNDROME 08/14/2006  . 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 Primary osteoarthritis of right knee 08/14/2006  . PLANTAR FASCIITIS 08/14/2006    Jones Bales, PT, DPT 11/02/2019, 1:42 PM  [facility] [address] [place], [place], [address] Phone: [phone]  Fax:  [phone]  Name: [name] [name] MRN: [id number] Date of Birth: September 12, 1940

## 2019-11-04 ENCOUNTER — Other Ambulatory Visit: Payer: Self-pay

## 2019-11-04 ENCOUNTER — Ambulatory Visit: Payer: Medicare HMO | Admitting: Physical Therapy

## 2019-11-04 ENCOUNTER — Encounter: Payer: Self-pay | Admitting: Physical Therapy

## 2019-11-04 ENCOUNTER — Ambulatory Visit: Payer: Medicare HMO

## 2019-11-04 DIAGNOSIS — R498 Other voice and resonance disorders: Secondary | ICD-10-CM | POA: Diagnosis not present

## 2019-11-04 DIAGNOSIS — M5136 Other intervertebral disc degeneration, lumbar region: Secondary | ICD-10-CM | POA: Diagnosis not present

## 2019-11-04 DIAGNOSIS — R2681 Unsteadiness on feet: Secondary | ICD-10-CM | POA: Diagnosis not present

## 2019-11-04 DIAGNOSIS — M48062 Spinal stenosis, lumbar region with neurogenic claudication: Secondary | ICD-10-CM | POA: Diagnosis not present

## 2019-11-04 DIAGNOSIS — R471 Dysarthria and anarthria: Secondary | ICD-10-CM | POA: Diagnosis not present

## 2019-11-04 DIAGNOSIS — R262 Difficulty in walking, not elsewhere classified: Secondary | ICD-10-CM | POA: Diagnosis not present

## 2019-11-04 DIAGNOSIS — M6281 Muscle weakness (generalized): Secondary | ICD-10-CM | POA: Diagnosis not present

## 2019-11-04 DIAGNOSIS — R2689 Other abnormalities of gait and mobility: Secondary | ICD-10-CM

## 2019-11-04 NOTE — Patient Instructions (Signed)
  Continue to do the exercises Mickel Baas gave you - 20 times is a good number to have some fatigued muscles. That's how you gain strength!  Do the "s" and "z" words as you like. Mickel Baas or I may add those to your exercises sometime later on.

## 2019-11-04 NOTE — Patient Instructions (Signed)
Access Code: [email or web address] URL: [email or web address] Date: 11/04/2019 Prepared by: Janann August  Exercises Tandem Walking with Counter Support - 2 x daily - 5 x weekly - 3 sets Standing Marching - 2 x daily - 5 x weekly - 3 sets Backward Walking with Counter Support - 2 x daily - 5 x weekly - 3 sets Romberg Stance Eyes Closed on Foam Pad - 2 x daily - 5 x weekly - 2 sets - 10 reps

## 2019-11-04 NOTE — Therapy (Signed)
[facility] [address] [place], [place], [address] Phone: [phone]   Fax:  [phone]  Speech Language Pathology Treatment  Patient Details  Name: [name] [name] MRN: [id number] Date of Birth: 1940/12/20 Referring Provider (SLP): Frann Rider, NP   Encounter Date: 11/04/2019   End of Session - 11/04/19 1650    Visit Number 2    Number of Visits 17    Date for SLP Re-Evaluation 12/28/19    SLP Start Time 1533    SLP Stop Time  1615    SLP Time Calculation (min) 42 min    Activity Tolerance Patient tolerated treatment well           Past Medical History:  Diagnosis Date  . Arthritis    knees  . Depression   . Fatty liver   . GERD (gastroesophageal reflux disease)   . Hyperlipidemia   . Hypertension   . IBS (irritable bowel syndrome)   . Obesity   . Pneumonia   . Sleep apnea    uses CPAP    Past Surgical History:  Procedure Laterality Date  . BLADDER SURGERY    . BREAST SURGERY     breast biopsy  . BROW LIFT Bilateral 04/14/2017   Procedure: BLEPHAROPLASTY UPPER EYELID WITH EXCESS SKIN;  Surgeon: Karle Starch, MD;  Location: [facility];  Service: Ophthalmology;  Laterality: Bilateral;  . CATARACT EXTRACTION W/PHACO Left 02/05/2016   Procedure: CATARACT EXTRACTION PHACO AND INTRAOCULAR LENS PLACEMENT (IOC);  Surgeon: Birder Robson, MD;  Location: [facility];  Service: Ophthalmology;  Laterality: Left;  [place] 01:10 AP% 22.3 CDE 15.67 Fluid pack lot # [id number]  . CATARACT EXTRACTION W/PHACO Right 02/26/2016   Procedure: CATARACT EXTRACTION PHACO AND INTRAOCULAR LENS PLACEMENT (IOC);  Surgeon: Birder Robson, MD;  Location: [facility];  Service: Ophthalmology;  Laterality: Right;  [place] 57.4 AP% 24.0 CDE 13.75 Fluid Pack lot # [id number]  . CHOLECYSTECTOMY    . COLONOSCOPY WITH PROPOFOL N/A 12/18/2014   Procedure: COLONOSCOPY WITH PROPOFOL;  Surgeon: Manya Silvas, MD;  Location: [facility]  ENDOSCOPY;  Service: Endoscopy;  Laterality: N/A;  . DEEP NECK LYMPH NODE BIOPSY / EXCISION    . JOINT REPLACEMENT    . KNEE ARTHROPLASTY Left 06/20/2015   Procedure: COMPUTER ASSISTED TOTAL KNEE ARTHROPLASTY;  Surgeon: Dereck Leep, MD;  Location: [facility];  Service: Orthopedics;  Laterality: Left;  . PTOSIS REPAIR Bilateral 04/14/2017   Procedure: PTOSIS REPAIR RESECT EX;  Surgeon: Karle Starch, MD;  Location: [facility];  Service: Ophthalmology;  Laterality: Bilateral;  sleep apnea  . TONSILLECTOMY    . TOTAL HIP ARTHROPLASTY Right 07/21/2018   Procedure: TOTAL HIP ARTHROPLASTY ANTERIOR APPROACH;  Surgeon: Gaynelle Arabian, MD;  [facility];  Service: Orthopedics;  Laterality: Right;    There were no vitals filed for this visit.   Subjective Assessment - 11/04/19 1623    Subjective "I saw 20 times? And three times a day? Is that REALLY what she wanted?" (pt, re: HEP for labial weakness)    Patient is accompained by: Family member   daughter [name]                ADULT SLP TREATMENT - 11/04/19 1536      General Information   Behavior/Cognition Alert;Cooperative;Pleasant mood      Treatment Provided   Treatment provided Cognitive-Linquistic      Cognitive-Linquistic Treatment   Treatment focused on Dysarthria;Voice    Skilled  Treatment Pt retrieved her HEP from her folder after dtr got folder from the car. Pt questioning frequency and rep number on the HEP - SLP assured pt that 20 reps x3/day would give her the best chance of regaining any strength lost in her labial structures due to the CVA. SLP assisted pt as she worked through each of hte exercises answering pt questions and correcting her procedure. Pt daughter also attempted some exercises. SLP got out mirror which assisted pt accuracy with HEP - LSP strongly encouraged pt to do HEP with hand mirror or bathroom mirror. SLP provided pt with /s/ and /z/ words to practice as well at her leisure. As pt appeared a  little overwhelmed with x20 reps x3/day (which SLP assured pt was WNL amount) SLP did not mandate pt also complete /s/ and /z/ words each day.       Assessment / Recommendations / Plan   Plan Continue with current plan of care      Progression Toward Goals   Progression toward goals Progressing toward goals            SLP Education - 11/04/19 1631    Education Details HEP - labial isometrics, need to do reps, sets, and frequency as directed    Person(s) Educated Patient;Child(ren)    Methods Explanation;Demonstration;Verbal cues    Comprehension Verbalized understanding;Returned demonstration;Verbal cues required;Need further instruction            SLP Short Term Goals - 11/04/19 1534      SLP SHORT TERM GOAL #1   Title Pt will complete HEP for labial weakness with mod I over 2 sessions    Time 4    Period Weeks    Status On-going      SLP SHORT TERM GOAL #2   Title Pt will produce sibalant fricatives accurately at sentence level with rare min A 18/20 sentences    Time 4    Period Weeks    Status On-going      SLP SHORT TERM GOAL #3   Title Pt will follow 3 reflux precautions over 3 sessions with mod I    Time 4    Period Weeks    Status On-going      SLP SHORT TERM GOAL #4   Title Pt will follow 3 vocal hygiene strategies over 3 sessions with mod I    Baseline voice goals to be modified pending ENT consult    Time 4    Period Weeks    Status On-going            SLP Long Term Goals - 11/04/19 1605      SLP LONG TERM GOAL #1   Title Pt will produce sibalant fricatives accurately in simple conversation over 15 minutes, self corrected as needed, with occasional min A over 2 session    Time 8    Period Weeks    Status On-going      SLP LONG TERM GOAL #2   Title Pt will achieve WNL phonation using strategies 15/20 sentences with occasional min A    Baseline may be modified following ENT consult    Time 8    Period Weeks    Status On-going      SLP LONG  TERM GOAL #3   Title Pt will follow 4 vocal hygiene strategies over 3 sessions with rare min A    Time 8    Period Weeks    Status On-going  SLP LONG TERM GOAL #4   Title Pt will improve Communicative Effectiveness Survey score by 2 points    Time 8    Period Weeks    Status On-going            Plan - 11/04/19 1607    Clinical Impression Statement [name] [name] presents with mild dysathria and moderate dysphonia. [name] [name] c/o a hoarse voice which she feels has worsened since her CVA and difficulty pronouncing /s/, /z/. I recommend skilled ST to maximize intelligilbity and QOL. Goals will be modified after ENT consult.    Speech Therapy Frequency 2x / week    Duration --   8 weeks or 17 visits   Treatment/Interventions Aspiration precaution training;Environmental controls;Cueing hierarchy;Oral motor exercises;SLP instruction and feedback;Compensatory strategies;Functional tasks;Cognitive reorganization;Compensatory techniques;Diet toleration management by SLP;Internal/external aids;Multimodal communcation approach;Patient/family education           Patient will benefit from skilled therapeutic intervention in order to improve the following deficits and impairments:   Dysarthria and anarthria  Other voice and resonance disorders    Problem List Patient Active Problem List   Diagnosis Date Noted  . Abdominal wall hernia 10/30/2019  . Change in voice 10/30/2019  . History of stroke 09/14/2019  . Spinal stenosis 07/24/2019  . Mixed hyperlipidemia   . OSA (obstructive sleep apnea)   . Dysfunction of eustachian tube 05/15/2019  . Other social stressor 03/29/2019  . 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 Diarrhea 06/20/2018  . Joint pain 06/20/2018  . 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 Medicare annual wellness visit, subsequent 09/15/2017  . SUI (stress urinary incontinence, female) 08/07/2017  . Back pain 06/16/2016  . Advance care planning  01/07/2016  . Sleep apnea   . 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 Allergic rhinitis 11/24/2014  . Adult BMI 30+ 11/24/2014  . Depression 11/24/2014  . Insomnia 08/23/2009  . Headache, migraine 05/10/2009  . HERPES SIMPLEX INFECTION 08/14/2006  . HYPERCHOLESTEROLEMIA 08/14/2006  . Anxiety state 08/14/2006  . Essential hypertension 08/14/2006  . HIATAL HERNIA 08/14/2006  . IRRITABLE BOWEL SYNDROME 08/14/2006  . 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 Primary osteoarthritis of right knee 08/14/2006  . PLANTAR FASCIITIS 08/14/2006    [place] ,MS, CCC-SLP  11/04/2019, 4:51 PM  [facility] [address] [place], [place], [address] Phone: [phone]   Fax:  [phone]   Name: [name] [name] MRN: [id number] Date of Birth: Dec 20, 1940

## 2019-11-04 NOTE — Therapy (Signed)
[facility] [address] [place], [place], [address] Phone: [phone]   Fax:  [phone]  Physical Therapy Treatment  Patient Details  Name: [name] [name] MRN: [id number] Date of Birth: Jun 15, 1940 Referring Provider (PT): Referred by Frann Rider, NP, PCP is Elsie Stain, MD    Encounter Date: 11/04/2019   PT End of Session - 11/04/19 1536    Visit Number 2    Number of Visits 13    Date for PT Re-Evaluation 01/31/20   POC for 6 weeks, Cert for 90 days   Authorization Type Medicare    PT Start Time 1448    PT Stop Time 1528    PT Time Calculation (min) 40 min    Equipment Utilized During Treatment Gait belt    Activity Tolerance Patient tolerated treatment well    Behavior During Therapy [facility] for tasks assessed/performed           Past Medical History:  Diagnosis Date  . Arthritis    knees  . Depression   . Fatty liver   . GERD (gastroesophageal reflux disease)   . Hyperlipidemia   . Hypertension   . IBS (irritable bowel syndrome)   . Obesity   . Pneumonia   . Sleep apnea    uses CPAP    Past Surgical History:  Procedure Laterality Date  . BLADDER SURGERY    . BREAST SURGERY     breast biopsy  . BROW LIFT Bilateral 04/14/2017   Procedure: BLEPHAROPLASTY UPPER EYELID WITH EXCESS SKIN;  Surgeon: Karle Starch, MD;  Location: [place];  Service: Ophthalmology;  Laterality: Bilateral;  . CATARACT EXTRACTION W/PHACO Left 02/05/2016   Procedure: CATARACT EXTRACTION PHACO AND INTRAOCULAR LENS PLACEMENT (IOC);  Surgeon: Birder Robson, MD;  Location: [facility];  Service: Ophthalmology;  Laterality: Left;  [place] 01:10 AP% 22.3 CDE 15.67 Fluid pack lot # [id number]  . CATARACT EXTRACTION W/PHACO Right 02/26/2016   Procedure: CATARACT EXTRACTION PHACO AND INTRAOCULAR LENS PLACEMENT (IOC);  Surgeon: Birder Robson, MD;  Location: [facility];  Service: Ophthalmology;  Laterality: Right;  [place] 57.4 AP%  24.0 CDE 13.75 Fluid Pack lot # [id number]  . CHOLECYSTECTOMY    . COLONOSCOPY WITH PROPOFOL N/A 12/18/2014   Procedure: COLONOSCOPY WITH PROPOFOL;  Surgeon: Manya Silvas, MD;  Location[facility];  Service: Endoscopy;  Laterality: N/A;  . DEEP NECK LYMPH NODE BIOPSY / EXCISION    . JOINT REPLACEMENT    . KNEE ARTHROPLASTY Left 06/20/2015   Procedure: COMPUTER ASSISTED TOTAL KNEE ARTHROPLASTY;  Surgeon: Dereck Leep, MD;  Location: [facility];  Service: Orthopedics;  Laterality: Left;  . PTOSIS REPAIR Bilateral 04/14/2017   Procedure: PTOSIS REPAIR RESECT EX;  Surgeon: Karle Starch, MD;  Location: [place];  Service: Ophthalmology;  Laterality: Bilateral;  sleep apnea  . TONSILLECTOMY    . TOTAL HIP ARTHROPLASTY Right 07/21/2018   Procedure: TOTAL HIP ARTHROPLASTY ANTERIOR APPROACH;  Surgeon: Gaynelle Arabian, MD;  Location: [facility];  Service: Orthopedics;  Laterality: Right;    There were no vitals filed for this visit.   Subjective Assessment - 11/04/19 1450    Subjective Saw the [facility] for pain management. Going to be getting shots in the L hip and the R knee. Also reports gripping onto items and dropping items that started after the stroke,    Pertinent History Obesity, hypertension, hyperlipidemia, Lt sciatic nerve pain, sleep apnea on CPAP.    Limitations Walking;Standing;Sitting  Patient Stated Goals Feel more like myself    Currently in Pain? Yes   chronic low back pain                          Access Code: [email or web address] URL: [email or web address] Date: 11/04/2019 Prepared by: Janann August  Initiated HEP:   Exercises Tandem Walking with Counter Support - 2 x daily - 5 x weekly - 3 sets Standing Marching - 2 x daily - 5 x weekly - 3 sets Backward Walking with Counter Support - 2 x daily - 5 x weekly - 3 sets Romberg Stance Eyes Closed on Foam Pad - 2 x daily - 5 x weekly - 2 sets - 10 reps - 2 x 10 reps head turns, 2 x 10 reps  head nods          Balance Exercises - 11/04/19 0001      Balance Exercises: Standing   Stepping Strategy Anterior;Posterior;Foam/compliant surface;10 reps;Limitations    Stepping Strategy Limitations 10 reps altnerating posterior stepping strategy with min guard for balance and no UE support, attempted anteriorly, however pt unable to perform due to RLE pain    Sidestepping Foam/compliant support;3 reps;Limitations    Sidestepping Limitations on blue foam beam, down and back x3 reps             PT Education - 11/04/19 1527    Education Details initial HEP, getting OT eval scheduled due to reports of decr L grip strength    Person(s) Educated Patient    Methods Explanation;Demonstration;Handout    Comprehension Verbalized understanding;Returned demonstration            PT Short Term Goals - 11/02/19 1329      PT SHORT TERM GOAL #1   Title Patient will be independent with Initial HEP    Time 3    Period Weeks    Status New    Target Date 11/23/19      PT SHORT TERM GOAL #2   Title Patient will improve 30 second chair stand test to >/= 12 reps w/ UE support to demo improved BLE strength    Baseline 10 sit <> stands    Time 3    Period Weeks    Status New    Target Date 11/23/19      PT SHORT TERM GOAL #3   Title Patient will improve FGA to >/= 20/30 to demonstrate improved balance and reduced risk for falls    Baseline 17/30    Time 3    Period Weeks    Status New    Target Date 11/23/19      PT SHORT TERM GOAL #4   Title Patient will demonstrate ability to ambulate >/= 500 ft w/o AD to demonstrate improved community mobility and ability to ambulate within neighborhood    Time 3    Period Weeks    Status New    Target Date 11/23/19             PT Long Term Goals - 11/02/19 1332      PT LONG TERM GOAL #1   Title Pt will be independent with Final HEP    Time 6    Period Weeks    Status New    Target Date 12/14/19      PT LONG TERM GOAL #2    Title Patient will improve gait speed to >/= 3.0 ft/sec to demonstrate improved community mobility  Baseline 2.2 ft/sec    Time 6    Period Weeks    Status New    Target Date 12/14/19      PT LONG TERM GOAL #3   Title Patient will improve FGA to >/= 24/30 to demonstrate reduced risk for falls    Baseline 17/30    Time 6    Period Weeks    Status New    Target Date 12/14/19      PT LONG TERM GOAL #4   Title Patient will demonstrate ability to complete TUG <9 secs w/o AD to demonstrate improved mobility    Baseline 10.97    Time 6    Period Weeks    Status New    Target Date 12/14/19      PT LONG TERM GOAL #5   Title Patient will demonstrate ability to ambulate >/= 600 ft outdoors on unlevel surfaces, Mod I, to demonstrate improved ability to ambulate around neighborhood    Time 6    Period Weeks    Status New    Target Date 12/14/19                 Plan - 11/04/19 1537    Clinical Impression Statement Focus of today's session was initiating HEP for countertop balance and standing balance in corner for incr vestibular input. Pt able to demo all correctly after initial cues. Attempted forward stepping strategy on compliant surface for balance and SLS, however pt unable to perform due to reports of incr pain in RLE. Will continue to progress towards LTGs.    Personal Factors and Comorbidities Comorbidity 3+    Comorbidities Obesity, hypertension, hyperlipidemia, Lt sciatic nerve pain, sleep apnea on CPAP    Examination-Activity Limitations Squat;Transfers;Stairs;Stand    Examination-Participation Restrictions Community Activity;Cleaning    Stability/Clinical Decision Making Evolving/Moderate complexity    Rehab Potential Good    PT Frequency 2x / week    PT Duration 6 weeks    PT Treatment/Interventions ADLs/Self Care Home Management;Moist Heat;Cryotherapy;Electrical Stimulation;DME Instruction;Gait training;Stair training;Functional mobility training;Therapeutic  activities;Therapeutic exercise;Balance training;Neuromuscular re-education;Patient/family education;Orthotic Fit/Training;Passive range of motion    PT Next Visit Plan how was HEP? dynamic balance, functional strengthening, corner balance with eyes closed/head motions.    Consulted and Agree with Plan of Care Patient           Patient will benefit from skilled therapeutic intervention in order to improve the following deficits and impairments:  Abnormal gait, Decreased balance, Decreased endurance, Difficulty walking, Decreased activity tolerance, Decreased safety awareness, Pain, Postural dysfunction, Decreased strength  Visit Diagnosis: Muscle weakness (generalized)  Other abnormalities of gait and mobility  Difficulty in walking, not elsewhere classified  Unsteadiness on feet     Problem List Patient Active Problem List   Diagnosis Date Noted  . Abdominal wall hernia 10/30/2019  . Change in voice 10/30/2019  . History of stroke 09/14/2019  . Spinal stenosis 07/24/2019  . Mixed hyperlipidemia   . OSA (obstructive sleep apnea)   . Dysfunction of eustachian tube 05/15/2019  . Other social stressor 03/29/2019  . 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 Diarrhea 06/20/2018  . Joint pain 06/20/2018  . 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 Medicare annual wellness visit, subsequent 09/15/2017  . SUI (stress urinary incontinence, female) 08/07/2017  . Back pain 06/16/2016  . Advance care planning 01/07/2016  . Sleep apnea   . 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 Allergic rhinitis 11/24/2014  .  Adult BMI 30+ 11/24/2014  . Depression 11/24/2014  . Insomnia 08/23/2009  . Headache, migraine 05/10/2009  . HERPES SIMPLEX INFECTION 08/14/2006  . HYPERCHOLESTEROLEMIA 08/14/2006  . Anxiety state 08/14/2006  . Essential hypertension 08/14/2006  . HIATAL HERNIA 08/14/2006  . IRRITABLE BOWEL SYNDROME 08/14/2006    . 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 Primary osteoarthritis of right knee 08/14/2006  . PLANTAR FASCIITIS 08/14/2006    Arliss Journey, PT, DPT  11/04/2019, 3:40 PM  [facility] [address] [place], [place], [address] Phone: [phone]   Fax:  [phone]  Name: [name] [name] MRN: [id number] Date of Birth: 01-08-1941

## 2019-11-08 ENCOUNTER — Telehealth: Payer: Self-pay

## 2019-11-08 NOTE — Telephone Encounter (Signed)
Pt called about filling out information needed for new pt form for Dr. Joya Gaskins if not included w/sent records.  Advised pt labs, dx, and imaging sent.  Pt doesn't need any more info.

## 2019-11-09 ENCOUNTER — Ambulatory Visit: Payer: Medicare HMO

## 2019-11-09 ENCOUNTER — Other Ambulatory Visit: Payer: Self-pay

## 2019-11-09 DIAGNOSIS — R262 Difficulty in walking, not elsewhere classified: Secondary | ICD-10-CM | POA: Diagnosis not present

## 2019-11-09 DIAGNOSIS — M6281 Muscle weakness (generalized): Secondary | ICD-10-CM | POA: Diagnosis not present

## 2019-11-09 DIAGNOSIS — R471 Dysarthria and anarthria: Secondary | ICD-10-CM | POA: Diagnosis not present

## 2019-11-09 DIAGNOSIS — R498 Other voice and resonance disorders: Secondary | ICD-10-CM | POA: Diagnosis not present

## 2019-11-09 DIAGNOSIS — R2689 Other abnormalities of gait and mobility: Secondary | ICD-10-CM

## 2019-11-09 DIAGNOSIS — R2681 Unsteadiness on feet: Secondary | ICD-10-CM | POA: Diagnosis not present

## 2019-11-09 NOTE — Therapy (Signed)
[facility] [address] [place], [place], [address] Phone: [phone]   Fax:  [phone]  Physical Therapy Treatment  Patient Details  Name: [name] [name] MRN: [id number] Date of Birth: 09-13-1940 Referring Provider (PT): Referred by Frann Rider, NP, PCP is Elsie Stain, MD    Encounter Date: 11/09/2019   PT End of Session - 11/09/19 1719    Visit Number 3    Number of Visits 13    Date for PT Re-Evaluation 01/31/20   POC for 6 weeks, Cert for 90 days   Authorization Type Medicare    PT Start Time 1531    PT Stop Time 1615    PT Time Calculation (min) 44 min    Equipment Utilized During Treatment Gait belt    Activity Tolerance Patient tolerated treatment well    Behavior During Therapy [facility] for tasks assessed/performed           Past Medical History:  Diagnosis Date  . Arthritis    knees  . Depression   . Fatty liver   . GERD (gastroesophageal reflux disease)   . Hyperlipidemia   . Hypertension   . IBS (irritable bowel syndrome)   . Obesity   . Pneumonia   . Sleep apnea    uses CPAP    Past Surgical History:  Procedure Laterality Date  . BLADDER SURGERY    . BREAST SURGERY     breast biopsy  . BROW LIFT Bilateral 04/14/2017   Procedure: BLEPHAROPLASTY UPPER EYELID WITH EXCESS SKIN;  Surgeon: Karle Starch, MD;  Location: [place];  Service: Ophthalmology;  Laterality: Bilateral;  . CATARACT EXTRACTION W/PHACO Left 02/05/2016   Procedure: CATARACT EXTRACTION PHACO AND INTRAOCULAR LENS PLACEMENT (IOC);  Surgeon: Birder Robson, MD;  Location: [facility];  Service: Ophthalmology;  Laterality: Left;  [place] 01:10 AP% 22.3 CDE 15.67 Fluid pack lot # [id number]  . CATARACT EXTRACTION W/PHACO Right 02/26/2016   Procedure: CATARACT EXTRACTION PHACO AND INTRAOCULAR LENS PLACEMENT (IOC);  Surgeon: Birder Robson, MD;  Location: [facility];  Service: Ophthalmology;  Laterality: Right;  [place] 57.4 AP%  24.0 CDE 13.75 Fluid Pack lot # [id number]  . CHOLECYSTECTOMY    . COLONOSCOPY WITH PROPOFOL N/A 12/18/2014   Procedure: COLONOSCOPY WITH PROPOFOL;  Surgeon: Manya Silvas, MD;  Location: [facility];  Service: Endoscopy;  Laterality: N/A;  . DEEP NECK LYMPH NODE BIOPSY / EXCISION    . JOINT REPLACEMENT    . KNEE ARTHROPLASTY Left 06/20/2015   Procedure: COMPUTER ASSISTED TOTAL KNEE ARTHROPLASTY;  Surgeon: Dereck Leep, MD;  Location: [facility];  Service: Orthopedics;  Laterality: Left;  . PTOSIS REPAIR Bilateral 04/14/2017   Procedure: PTOSIS REPAIR RESECT EX;  Surgeon: Karle Starch, MD;  Location: [place];  Service: Ophthalmology;  Laterality: Bilateral;  sleep apnea  . TONSILLECTOMY    . TOTAL HIP ARTHROPLASTY Right 07/21/2018   Procedure: TOTAL HIP ARTHROPLASTY ANTERIOR APPROACH;  Surgeon: Gaynelle Arabian, MD;  [facility]: [facility];  Service: Orthopedics;  Laterality: Right;    There were no vitals filed for this visit.   Subjective Assessment - 11/09/19 1537    Subjective Patient reports has not been able to complete HEP as much as she would like. No pain. Just feels like a woozy.    Pertinent History Obesity, hypertension, hyperlipidemia, Lt sciatic nerve pain, sleep apnea on CPAP.    Limitations Walking;Standing;Sitting    Patient Stated Goals Feel more like myself  Currently in Pain? No/denies                             [facility] Adult PT Treatment/Exercise - 11/09/19 1617      Transfers   Transfers Sit to Stand;Stand to Sit    Sit to Stand 6: Modified independent (Device/Increase time)    Stand to Sit 6: Modified independent (Device/Increase time)      Ambulation/Gait   Ambulation/Gait Yes    Ambulation/Gait Assistance 5: Supervision;4: Min guard    Ambulation/Gait Assistance Details Completed gait training on unlevel outdoors surfaces including pavement and grass. Patient required supv across pavement and showed no instances of instability.  Completed ambulation over grass, with patient require CGA from PT for steadying and increased difficulty maintaining balance.     Ambulation Distance (Feet) 600 Feet    Assistive device None    Gait Pattern Step-through pattern;Decreased arm swing - right;Decreased arm swing - left;Decreased dorsiflexion - right;Decreased dorsiflexion - left;Poor foot clearance - left;Ataxic    Ambulation Surface Unlevel;Outdoor;Paved;Grass               Balance Exercises - 11/09/19 0001      Balance Exercises: Standing   Standing Eyes Opened Narrow base of support (BOS);Head turns;Foam/compliant surface;Other reps (comment)   2 x 10 reps of horizontal/vertical head turns   Standing Eyes Closed Narrow base of support (BOS);Foam/compliant surface;3 reps;30 secs    Tandem Stance Eyes open;3 reps;30 secs;Limitations   completed with modified tandem   Tandem Stance Time 20-30 seconds    Other Standing Exercises Comments Updated HEP to include balance exercises completed in today's session, with patient verbalizing feeling comfortable with completion of all. PT providing verbal cues for slow pace with head turns due to patient reports of neck pain. Educated to continue exercises standing in corner with chair placed in front for safety. See patient instructions/medbridge for more details.              PT Education - 11/09/19 1718    Education Details Educated on HEP update (see patient instructions)    Person(s) Educated Patient    Methods Explanation;Demonstration;Handout    Comprehension Verbalized understanding;Returned demonstration            PT Short Term Goals - 11/02/19 1329      PT SHORT TERM GOAL #1   Title Patient will be independent with Initial HEP    Time 3    Period Weeks    Status New    Target Date 11/23/19      PT SHORT TERM GOAL #2   Title Patient will improve 30 second chair stand test to >/= 12 reps w/ UE support to demo improved BLE strength    Baseline 10 sit <> stands     Time 3    Period Weeks    Status New    Target Date 11/23/19      PT SHORT TERM GOAL #3   Title Patient will improve FGA to >/= 20/30 to demonstrate improved balance and reduced risk for falls    Baseline 17/30    Time 3    Period Weeks    Status New    Target Date 11/23/19      PT SHORT TERM GOAL #4   Title Patient will demonstrate ability to ambulate >/= 500 ft w/o AD to demonstrate improved community mobility and ability to ambulate within neighborhood    Time 3  Period Weeks    Status New    Target Date 11/23/19             PT Long Term Goals - 11/02/19 1332      PT LONG TERM GOAL #1   Title Pt will be independent with Final HEP    Time 6    Period Weeks    Status New    Target Date 12/14/19      PT LONG TERM GOAL #2   Title Patient will improve gait speed to >/= 3.0 ft/sec to demonstrate improved community mobility    Baseline 2.2 ft/sec    Time 6    Period Weeks    Status New    Target Date 12/14/19      PT LONG TERM GOAL #3   Title Patient will improve FGA to >/= 24/30 to demonstrate reduced risk for falls    Baseline 17/30    Time 6    Period Weeks    Status New    Target Date 12/14/19      PT LONG TERM GOAL #4   Title Patient will demonstrate ability to complete TUG <9 secs w/o AD to demonstrate improved mobility    Baseline 10.97    Time 6    Period Weeks    Status New    Target Date 12/14/19      PT LONG TERM GOAL #5   Title Patient will demonstrate ability to ambulate >/= 600 ft outdoors on unlevel surfaces, Mod I, to demonstrate improved ability to ambulate around neighborhood    Time 6    Period Weeks    Status New    Target Date 12/14/19                 Plan - 11/09/19 1720    Clinical Impression Statement Today's skilled PT session included gait training on outdoor unlevel surfaces including pavement and grass, patient demo difficulty with balance on grass surfaces requiring CGA. Reviewed HEP and updated due to patient  reports of dififculty completing 1-2 exercises, pt verbalized improvements with completion at end of session. Pt will continue to benefit from skilled PT services to progress toward all goals.    Personal Factors and Comorbidities Comorbidity 3+    Comorbidities Obesity, hypertension, hyperlipidemia, Lt sciatic nerve pain, sleep apnea on CPAP    Examination-Activity Limitations Squat;Transfers;Stairs;Stand    Examination-Participation Restrictions Community Activity;Cleaning    Stability/Clinical Decision Making Evolving/Moderate complexity    Rehab Potential Good    PT Frequency 2x / week    PT Duration 6 weeks    PT Treatment/Interventions ADLs/Self Care Home Management;Moist Heat;Cryotherapy;Electrical Stimulation;DME Instruction;Gait training;Stair training;Functional mobility training;Therapeutic activities;Therapeutic exercise;Balance training;Neuromuscular re-education;Patient/family education;Orthotic Fit/Training;Passive range of motion    PT Next Visit Plan how was HEP? dynamic balance, functional strengthening, corner balance with eyes closed/head motions.    Consulted and Agree with Plan of Care Patient           Patient will benefit from skilled therapeutic intervention in order to improve the following deficits and impairments:  Abnormal gait, Decreased balance, Decreased endurance, Difficulty walking, Decreased activity tolerance, Decreased safety awareness, Pain, Postural dysfunction, Decreased strength  Visit Diagnosis: Muscle weakness (generalized)  Other abnormalities of gait and mobility  Difficulty in walking, not elsewhere classified  Unsteadiness on feet     Problem List Patient Active Problem List   Diagnosis Date Noted  . Abdominal wall hernia 10/30/2019  . Change in voice 10/30/2019  . History  of stroke 09/14/2019  . Spinal stenosis 07/24/2019  . Mixed hyperlipidemia   . OSA (obstructive sleep apnea)   . Dysfunction of eustachian tube 05/15/2019  .  Other social stressor 03/29/2019  . 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 Diarrhea 06/20/2018  . Joint pain 06/20/2018  . 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 Medicare annual wellness visit, subsequent 09/15/2017  . SUI (stress urinary incontinence, female) 08/07/2017  . Back pain 06/16/2016  . Advance care planning 01/07/2016  . Sleep apnea   . 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 Allergic rhinitis 11/24/2014  . Adult BMI 30+ 11/24/2014  . Depression 11/24/2014  . Insomnia 08/23/2009  . Headache, migraine 05/10/2009  . HERPES SIMPLEX INFECTION 08/14/2006  . HYPERCHOLESTEROLEMIA 08/14/2006  . Anxiety state 08/14/2006  . Essential hypertension 08/14/2006  . HIATAL HERNIA 08/14/2006  . IRRITABLE BOWEL SYNDROME 08/14/2006  . 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 Primary osteoarthritis of right knee 08/14/2006  . PLANTAR FASCIITIS 08/14/2006    Jones Bales, PT, DPT 11/09/2019, 5:23 PM  [facility] [address] [place], [place], [address] Phone: [phone]   Fax:  [phone]  Name: [name] [name] MRN: [id number] Date of Birth: Apr 12, 1941

## 2019-11-09 NOTE — Patient Instructions (Addendum)
Access Code: [email or web address] URL: [email or web address] Date: 11/09/2019 Prepared by: Baldomero Lamy  Exercises Tandem Walking with Counter Support - 2 x daily - 5 x weekly - 3 sets Standing Marching - 2 x daily - 5 x weekly - 3 sets Backward Walking with Counter Support - 2 x daily - 5 x weekly - 3 sets Romberg Stance Eyes Closed on Foam Pad - 2 x daily - 5 x weekly - 1 sets - 3 reps - 30 hold Tandem Stance in Corner - 1 x daily - 7 x weekly - 1 sets - 3 reps - 30 hold Romberg Stance on Foam Pad with Head Rotation - 1 x daily - 7 x weekly - 2 sets - 10 reps

## 2019-11-11 ENCOUNTER — Other Ambulatory Visit: Payer: Self-pay

## 2019-11-11 ENCOUNTER — Ambulatory Visit: Payer: Medicare HMO

## 2019-11-11 DIAGNOSIS — R262 Difficulty in walking, not elsewhere classified: Secondary | ICD-10-CM | POA: Diagnosis not present

## 2019-11-11 DIAGNOSIS — R2689 Other abnormalities of gait and mobility: Secondary | ICD-10-CM | POA: Diagnosis not present

## 2019-11-11 DIAGNOSIS — R2681 Unsteadiness on feet: Secondary | ICD-10-CM

## 2019-11-11 DIAGNOSIS — M6281 Muscle weakness (generalized): Secondary | ICD-10-CM

## 2019-11-11 DIAGNOSIS — R498 Other voice and resonance disorders: Secondary | ICD-10-CM | POA: Diagnosis not present

## 2019-11-11 DIAGNOSIS — R471 Dysarthria and anarthria: Secondary | ICD-10-CM | POA: Diagnosis not present

## 2019-11-11 NOTE — Therapy (Signed)
[facility] [address] [place], [place], [address] Phone: [phone]   Fax:  [phone]  Physical Therapy Treatment  Patient Details  Name: [name] [name] MRN: [id number] Date of Birth: 10/08/1940 Referring Provider (PT): Referred by Frann Rider, NP, PCP is Elsie Stain, MD    Encounter Date: 11/11/2019   PT End of Session - 11/11/19 1110    Visit Number 4    Number of Visits 13    Date for PT Re-Evaluation 01/31/20   POC for 6 weeks, Cert for 90 days   Authorization Type Medicare    PT Start Time 1101    PT Stop Time 1146    PT Time Calculation (min) 45 min    Equipment Utilized During Treatment Gait belt    Activity Tolerance Patient tolerated treatment well    Behavior During Therapy [facility] for tasks assessed/performed           Past Medical History:  Diagnosis Date  . Arthritis    knees  . Depression   . Fatty liver   . GERD (gastroesophageal reflux disease)   . Hyperlipidemia   . Hypertension   . IBS (irritable bowel syndrome)   . Obesity   . Pneumonia   . Sleep apnea    uses CPAP    Past Surgical History:  Procedure Laterality Date  . BLADDER SURGERY    . BREAST SURGERY     breast biopsy  . BROW LIFT Bilateral 04/14/2017   Procedure: BLEPHAROPLASTY UPPER EYELID WITH EXCESS SKIN;  Surgeon: Karle Starch, MD;  Location: [place];  Service: Ophthalmology;  Laterality: Bilateral;  . CATARACT EXTRACTION W/PHACO Left 02/05/2016   Procedure: CATARACT EXTRACTION PHACO AND INTRAOCULAR LENS PLACEMENT (IOC);  Surgeon: Birder Robson, MD;  Location: [facility];  Service: Ophthalmology;  Laterality: Left;  [place] 01:10 AP% 22.3 CDE 15.67 Fluid pack lot # [id number]  . CATARACT EXTRACTION W/PHACO Right 02/26/2016   Procedure: CATARACT EXTRACTION PHACO AND INTRAOCULAR LENS PLACEMENT (IOC);  Surgeon: Birder Robson, MD;  Location: [facility];  Service: Ophthalmology;  Laterality: Right;  [place] 57.4 AP%  24.0 CDE 13.75 Fluid Pack lot # [id number]  . CHOLECYSTECTOMY    . COLONOSCOPY WITH PROPOFOL N/A 12/18/2014   Procedure: COLONOSCOPY WITH PROPOFOL;  Surgeon: Manya Silvas, MD;  Location: [facility];  Service: Endoscopy;  Laterality: N/A;  . DEEP NECK LYMPH NODE BIOPSY / EXCISION    . JOINT REPLACEMENT    . KNEE ARTHROPLASTY Left 06/20/2015   Procedure: COMPUTER ASSISTED TOTAL KNEE ARTHROPLASTY;  Surgeon: Dereck Leep, MD;  Location: [facility];  Service: Orthopedics;  Laterality: Left;  . PTOSIS REPAIR Bilateral 04/14/2017   Procedure: PTOSIS REPAIR RESECT EX;  Surgeon: Karle Starch, MD;  Location: [place];  Service: Ophthalmology;  Laterality: Bilateral;  sleep apnea  . TONSILLECTOMY    . TOTAL HIP ARTHROPLASTY Right 07/21/2018   Procedure: TOTAL HIP ARTHROPLASTY ANTERIOR APPROACH;  Surgeon: Gaynelle Arabian, MD;  Location: [facility];  Service: Orthopedics;  Laterality: Right;    There were no vitals filed for this visit.   Subjective Assessment - 11/11/19 1106    Subjective Patient reports that additions to HEP went well. No pain. Patient reports was slow to get going this morning.    Pertinent History Obesity, hypertension, hyperlipidemia, Lt sciatic nerve pain, sleep apnea on CPAP.    Limitations Walking;Standing;Sitting    Patient Stated Goals Feel more like myself    Currently in  Pain? No/denies                             [facility] Adult PT Treatment/Exercise - 11/11/19 1152      Ambulation/Gait   Ambulation/Gait Yes    Ambulation/Gait Assistance 5: Supervision    Ambulation/Gait Assistance Details throughout therapy gym with activities    Assistive device None    Gait Pattern Step-through pattern;Decreased arm swing - right;Decreased arm swing - left;Decreased dorsiflexion - right;Decreased dorsiflexion - left;Poor foot clearance - left;Ataxic    Ambulation Surface Level;Indoor      Posture/Postural Control   Posture/Postural Control Postural  limitations    Postural Limitations Rounded Shoulders;Forward head;Increased thoracic kyphosis      High Level Balance   High Level Balance Activities Marching forwards;Backward walking;Tandem walking    High Level Balance Comments Completed marching forwards with followed backwards walking x 6 laps in // bars, completed w/o UE support. Also compelted tandem walking x 3 laps in // bars, pt require intermittent CGA for steadying.       Self-Care   Self-Care Posture;Other Self-Care Comments    Posture Patient reporting that she would like to work on posture. PT providing extensive education on posture and mechanics with sitting/standing throughout the day. Also provided education on impotance of proper strengthening of posterior neck muscualture and scapular mucsulatre to promote improved posture.     Other Self-Care Comments  Patient reporting that she felt like knee/hip pain and stiffness can be limiting. Educated on pain neuroscience and the importance and impact mobility can have on the joints for improved mobility. Educated to complete stationary cycling x 5 minutes to tolerance at home. Can continue to progress time/resistance as tolerated.       Exercises   Exercises Other Exercises    Other Exercises  Completed scapular retraction with Red Therband, 1 x 10 reps. PT providing verbal cues for proper compeltion to ensure scapular retraction with responding well to tactile/verbal cues. Completed cervical retraction 1 x 10 reps. PT providing verbal cues to avoid cervical extension with completion. Educated on how these exercises can work to help improve posture. Educated on proper completion at home.                Balance Exercises - 11/11/19 1150      Balance Exercises: Standing   Tandem Stance Eyes open;3 reps;30 secs;Limitations    Tandem Stance Time 20-30 seconds, pt unable to hold full 30 seconds with LLE placed anteriorly.     Rockerboard Anterior/posterior;EO;EC;30  seconds;Limitations    Rockerboard Limitations focused on holding rockerboard steady, completed 30 secs x 2 reps with EO. Patient demo good stability with EO, progressed to completing EC, 3 x 30 seconds. Increased CGA required. Pt needed intermittent UE support with EC due to posterior sway.     Balance Beam Completed static standing on balance beam, completed with EC 3 x 1 min.              PT Education - 11/11/19 1149    Education Details Educated on HEP additions, Educated on posture, as well as beginning to complete stationary cycling/walking program at home for improved mobility. Educated on importance of movement for joints and production of synovial fluid.    Person(s) Educated Patient    Methods Explanation;Demonstration;Handout    Comprehension Verbalized understanding;Returned demonstration            PT Short Term Goals - 11/02/19 1329  PT SHORT TERM GOAL #1   Title Patient will be independent with Initial HEP    Time 3    Period Weeks    Status New    Target Date 11/23/19      PT SHORT TERM GOAL #2   Title Patient will improve 30 second chair stand test to >/= 12 reps w/ UE support to demo improved BLE strength    Baseline 10 sit <> stands    Time 3    Period Weeks    Status New    Target Date 11/23/19      PT SHORT TERM GOAL #3   Title Patient will improve FGA to >/= 20/30 to demonstrate improved balance and reduced risk for falls    Baseline 17/30    Time 3    Period Weeks    Status New    Target Date 11/23/19      PT SHORT TERM GOAL #4   Title Patient will demonstrate ability to ambulate >/= 500 ft w/o AD to demonstrate improved community mobility and ability to ambulate within neighborhood    Time 3    Period Weeks    Status New    Target Date 11/23/19             PT Long Term Goals - 11/02/19 1332      PT LONG TERM GOAL #1   Title Pt will be independent with Final HEP    Time 6    Period Weeks    Status New    Target Date 12/14/19        PT LONG TERM GOAL #2   Title Patient will improve gait speed to >/= 3.0 ft/sec to demonstrate improved community mobility    Baseline 2.2 ft/sec    Time 6    Period Weeks    Status New    Target Date 12/14/19      PT LONG TERM GOAL #3   Title Patient will improve FGA to >/= 24/30 to demonstrate reduced risk for falls    Baseline 17/30    Time 6    Period Weeks    Status New    Target Date 12/14/19      PT LONG TERM GOAL #4   Title Patient will demonstrate ability to complete TUG <9 secs w/o AD to demonstrate improved mobility    Baseline 10.97    Time 6    Period Weeks    Status New    Target Date 12/14/19      PT LONG TERM GOAL #5   Title Patient will demonstrate ability to ambulate >/= 600 ft outdoors on unlevel surfaces, Mod I, to demonstrate improved ability to ambulate around neighborhood    Time 6    Period Weeks    Status New    Target Date 12/14/19                 Plan - 11/11/19 1247    Clinical Impression Statement Today's skilled PT session focused on contiuned balance exercises, working with vision removed and complaint/unlevel surfaces. PT providing extensive education on posture and body mechanics and ways to help improve this. Also educated on importance of motion/exercise for the hip/knee joints due to patient reports of pain and osteoarthritis. Pt will continue to benefit from skilled PT services to progress toward goals and improve functional mobility.    Personal Factors and Comorbidities Comorbidity 3+    Comorbidities Obesity, hypertension, hyperlipidemia, Lt sciatic nerve pain,  sleep apnea on CPAP    Examination-Activity Limitations Squat;Transfers;Stairs;Stand    Examination-Participation Restrictions Community Activity;Cleaning    Stability/Clinical Decision Making Evolving/Moderate complexity    Rehab Potential Good    PT Frequency 2x / week    PT Duration 6 weeks    PT Treatment/Interventions ADLs/Self Care Home Management;Moist  Heat;Cryotherapy;Electrical Stimulation;DME Instruction;Gait training;Stair training;Functional mobility training;Therapeutic activities;Therapeutic exercise;Balance training;Neuromuscular re-education;Patient/family education;Orthotic Fit/Training;Passive range of motion    PT Next Visit Plan how was HEP? dynamic balance, functional strengthening, corner balance with eyes closed/head motions.    Consulted and Agree with Plan of Care Patient           Patient will benefit from skilled therapeutic intervention in order to improve the following deficits and impairments:  Abnormal gait, Decreased balance, Decreased endurance, Difficulty walking, Decreased activity tolerance, Decreased safety awareness, Pain, Postural dysfunction, Decreased strength  Visit Diagnosis: Muscle weakness (generalized)  Other abnormalities of gait and mobility  Difficulty in walking, not elsewhere classified  Unsteadiness on feet     Problem List Patient Active Problem List   Diagnosis Date Noted  . Abdominal wall hernia 10/30/2019  . Change in voice 10/30/2019  . History of stroke 09/14/2019  . Spinal stenosis 07/24/2019  . Mixed hyperlipidemia   . OSA (obstructive sleep apnea)   . Dysfunction of eustachian tube 05/15/2019  . Other social stressor 03/29/2019  . 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 Diarrhea 06/20/2018  . Joint pain 06/20/2018  . 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 Medicare annual wellness visit, subsequent 09/15/2017  . SUI (stress urinary incontinence, female) 08/07/2017  . Back pain 06/16/2016  . Advance care planning 01/07/2016  . Sleep apnea   . 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 Allergic rhinitis 11/24/2014  . Adult BMI 30+ 11/24/2014  . Depression 11/24/2014  . Insomnia 08/23/2009  . Headache, migraine 05/10/2009  . HERPES SIMPLEX INFECTION 08/14/2006  . HYPERCHOLESTEROLEMIA 08/14/2006  . Anxiety  state 08/14/2006  . Essential hypertension 08/14/2006  . HIATAL HERNIA 08/14/2006  . IRRITABLE BOWEL SYNDROME 08/14/2006  . 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 Primary osteoarthritis of right knee 08/14/2006  . PLANTAR FASCIITIS 08/14/2006    Jones Bales, PT, DPT 11/11/2019, 12:55 PM  [facility] [address] [place], [place], [address] Phone: [phone]   Fax:  [phone]  Name: [name] [name] MRN: [id number] Date of Birth: 1941-01-27

## 2019-11-11 NOTE — Patient Instructions (Signed)
Access Code: [email or web address] URL: [email or web address] Date: 11/11/2019 Prepared by: Baldomero Lamy  Exercises Tandem Walking with Counter Support - 2 x daily - 5 x weekly - 3 sets Standing Marching - 2 x daily - 5 x weekly - 3 sets Backward Walking with Counter Support - 2 x daily - 5 x weekly - 3 sets Romberg Stance Eyes Closed on Foam Pad - 2 x daily - 5 x weekly - 1 sets - 3 reps - 30 hold Tandem Stance in Corner - 1 x daily - 7 x weekly - 1 sets - 3 reps - 30 hold Romberg Stance on Foam Pad with Head Rotation - 1 x daily - 7 x weekly - 2 sets - 10 reps Scapular Retraction with Resistance - 1 x daily - 7 x weekly - 2 sets - 10 reps Seated Cervical Retraction - 1 x daily - 7 x weekly - 2 sets - 10 reps

## 2019-11-14 ENCOUNTER — Telehealth: Payer: Self-pay | Admitting: Adult Health

## 2019-11-14 NOTE — Telephone Encounter (Signed)
I called and spoke to pt.  She has had back and knee pain prior to stroke,  She has received on prior and one after she had stroke spinal injection , now she is due on Thursday this week about getting nerve block, which he felt would be safer for pt.  She is in blind study, but due to having hair electroylis felt like she is getting blood thinner due to bleeding she has had when she has gotten this done.  She questions whether she should come off study since she is having so many issues ( should she do the nerve block?  Get of medication prior prodecure?

## 2019-11-14 NOTE — Telephone Encounter (Signed)
What is the indication of the nerve block?  Is she requesting clearance in regards to medications or from a stroke standpoint?  Typically if clearance is requested a form will be sent to our office for further determination.  Have we received any clearance form?

## 2019-11-14 NOTE — Telephone Encounter (Signed)
Pt called wanting to speak to the provider to find out if it is ok for her to get a nerve block. Please advise.

## 2019-11-15 NOTE — Telephone Encounter (Signed)
LMVM for pt that returned call, that research will reach out to you.

## 2019-11-15 NOTE — Telephone Encounter (Signed)
If she is getting a nerve block on Thursday and needs to stop aspirin, I would advise her to further discuss this with the provider who is doing injection as aspirin is typically held 3 to 5 days prior to any type of procedure that require stopping blood thinner.  From a stroke standpoint, she is cleared to hold aspirin prior to procedure with small but acceptable preprocedure risk of recurrent stroke off aspirin therapy.  She should restart aspirin immediately after once hemodynamically stable.  In regards to the research study, I would advise her to reach out to the research team for further input.

## 2019-11-16 ENCOUNTER — Other Ambulatory Visit: Payer: Self-pay

## 2019-11-16 ENCOUNTER — Encounter: Payer: Self-pay | Admitting: Speech Pathology

## 2019-11-16 ENCOUNTER — Ambulatory Visit: Payer: Medicare HMO

## 2019-11-16 ENCOUNTER — Ambulatory Visit: Payer: Medicare HMO | Admitting: Speech Pathology

## 2019-11-16 DIAGNOSIS — M6281 Muscle weakness (generalized): Secondary | ICD-10-CM | POA: Diagnosis not present

## 2019-11-16 DIAGNOSIS — R471 Dysarthria and anarthria: Secondary | ICD-10-CM | POA: Diagnosis not present

## 2019-11-16 DIAGNOSIS — R498 Other voice and resonance disorders: Secondary | ICD-10-CM

## 2019-11-16 DIAGNOSIS — R2681 Unsteadiness on feet: Secondary | ICD-10-CM | POA: Diagnosis not present

## 2019-11-16 DIAGNOSIS — R262 Difficulty in walking, not elsewhere classified: Secondary | ICD-10-CM | POA: Diagnosis not present

## 2019-11-16 DIAGNOSIS — R2689 Other abnormalities of gait and mobility: Secondary | ICD-10-CM | POA: Diagnosis not present

## 2019-11-16 NOTE — Therapy (Signed)
[facility] [address] [place], [place], [address] Phone: [phone]   Fax:  [phone]  Speech Language Pathology Treatment  Patient Details  Name: [name] [name] MRN: [id number] Date of Birth: 1941/01/07 Referring Provider (SLP): Frann Rider, NP   Encounter Date: 11/16/2019   End of Session - 11/16/19 1253    Visit Number 3    Number of Visits 17    Date for SLP Re-Evaluation 12/28/19    SLP Start Time 0932    SLP Stop Time  8453    SLP Time Calculation (min) 43 min    Activity Tolerance Patient tolerated treatment well           Past Medical History:  Diagnosis Date  . Arthritis    knees  . Depression   . Fatty liver   . GERD (gastroesophageal reflux disease)   . Hyperlipidemia   . Hypertension   . IBS (irritable bowel syndrome)   . Obesity   . Pneumonia   . Sleep apnea    uses CPAP    Past Surgical History:  Procedure Laterality Date  . BLADDER SURGERY    . BREAST SURGERY     breast biopsy  . BROW LIFT Bilateral 04/14/2017   Procedure: BLEPHAROPLASTY UPPER EYELID WITH EXCESS SKIN;  Surgeon: Karle Starch, MD;  Location: [place];  Service: Ophthalmology;  Laterality: Bilateral;  . CATARACT EXTRACTION W/PHACO Left 02/05/2016   Procedure: CATARACT EXTRACTION PHACO AND INTRAOCULAR LENS PLACEMENT (IOC);  Surgeon: Birder Robson, MD;  Location: [facility];  Service: Ophthalmology;  Laterality: Left;  [place] 01:10 AP% 22.3 CDE 15.67 Fluid pack lot # [id number]  . CATARACT EXTRACTION W/PHACO Right 02/26/2016   Procedure: CATARACT EXTRACTION PHACO AND INTRAOCULAR LENS PLACEMENT (IOC);  Surgeon: Birder Robson, MD;  Location: [facility];  Service: Ophthalmology;  Laterality: Right;  [place] 57.4 AP% 24.0 CDE 13.75 Fluid Pack lot # [id number]  . CHOLECYSTECTOMY    . COLONOSCOPY WITH PROPOFOL N/A 12/18/2014   Procedure: COLONOSCOPY WITH PROPOFOL;  Surgeon: Manya Silvas, MD;  [facility];  Service: Endoscopy;  Laterality: N/A;  . DEEP NECK LYMPH NODE BIOPSY / EXCISION    . JOINT REPLACEMENT    . KNEE ARTHROPLASTY Left 06/20/2015   Procedure: COMPUTER ASSISTED TOTAL KNEE ARTHROPLASTY;  Surgeon: Dereck Leep, MD;  Location: [facility];  Service: Orthopedics;  Laterality: Left;  . PTOSIS REPAIR Bilateral 04/14/2017   Procedure: PTOSIS REPAIR RESECT EX;  Surgeon: Karle Starch, MD;  Location: [place];  Service: Ophthalmology;  Laterality: Bilateral;  sleep apnea  . TONSILLECTOMY    . TOTAL HIP ARTHROPLASTY Right 07/21/2018   Procedure: TOTAL HIP ARTHROPLASTY ANTERIOR APPROACH;  Surgeon: Gaynelle Arabian, MD;  Location: [facility];  Service: Orthopedics;  Laterality: Right;    There were no vitals filed for this visit.   Subjective Assessment - 11/16/19 0940    Subjective "I didn't sleep because I was so worried about my health"    Currently in Pain? No/denies                 ADULT SLP TREATMENT - 11/16/19 0942      General Information   Behavior/Cognition Alert;Cooperative;Pleasant mood      Treatment Provided   Treatment provided Cognitive-Linquistic      Cognitive-Linquistic Treatment   Treatment focused on Dysarthria;Voice    Skilled Treatment Pt seeing ENT next week We generated a lsit of questions for Dr. Joya Gaskins,  and wrote down her symptoms. Reviewed HEP for facial weekness with raraemin A.  She reports reduced left oral pocketing with meals. She continues to avoiding some bread and steak. She is following reflux precautions not eating 1 hour before bed and avoids tight clothing.       Assessment / Recommendations / Plan   Plan Continue with current plan of care      Progression Toward Goals   Progression toward goals Progressing toward goals            SLP Education - 11/16/19 1013    Education Details reflux precautions, HEP for facial weakness.    Person(s) Educated Patient    Methods Explanation;Verbal cues;Handout     Comprehension Verbalized understanding;Returned demonstration;Verbal cues required;Need further instruction            SLP Short Term Goals - 11/16/19 1253      SLP SHORT TERM GOAL #1   Title Pt will complete HEP for labial weakness with mod I over 2 sessions    Time 4    Period Weeks    Status On-going      SLP SHORT TERM GOAL #2   Title Pt will produce sibalant fricatives accurately at sentence level with rare min A 18/20 sentences    Time 4    Period Weeks    Status On-going      SLP SHORT TERM GOAL #3   Title Pt will follow 3 reflux precautions over 3 sessions with mod I    Time 4    Period Weeks    Status On-going      SLP SHORT TERM GOAL #4   Title Pt will follow 3 vocal hygiene strategies over 3 sessions with mod I    Baseline voice goals to be modified pending ENT consult    Time 4    Period Weeks    Status On-going            SLP Long Term Goals - 11/16/19 1253      SLP LONG TERM GOAL #1   Title Pt will produce sibalant fricatives accurately in simple conversation over 15 minutes, self corrected as needed, with occasional min A over 2 session    Time 8    Period Weeks    Status On-going      SLP LONG TERM GOAL #2   Title Pt will achieve WNL phonation using strategies 15/20 sentences with occasional min A    Baseline may be modified following ENT consult    Time 8    Period Weeks    Status On-going      SLP LONG TERM GOAL #3   Title Pt will follow 4 vocal hygiene strategies over 3 sessions with rare min A    Time 8    Period Weeks    Status On-going      SLP LONG TERM GOAL #4   Title Pt will improve Communicative Effectiveness Survey score by 2 points    Time 8    Period Weeks    Status On-going            Plan - 11/16/19 1016    Clinical Impression Statement 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 Gudelia c/o a hoarse voice which she feels has worsened since her CVA and difficulty pronouncing /s/, /z/. I recommend skilled  ST to maximize intelligilbity and QOL. Goals will be modified after ENT consult.    Speech Therapy Frequency 2x / week  Duration --   8 weeks or 17 visits   Treatment/Interventions Aspiration precaution training;Environmental controls;Cueing hierarchy;Oral motor exercises;SLP instruction and feedback;Compensatory strategies;Functional tasks;Cognitive reorganization;Compensatory techniques;Diet toleration management by SLP;Internal/external aids;Multimodal communcation approach;Patient/family education    Potential to Achieve Goals Good           Patient will benefit from skilled therapeutic intervention in order to improve the following deficits and impairments:   Dysarthria and anarthria  Other voice and resonance disorders    Problem List Patient Active Problem List   Diagnosis Date Noted  . Abdominal wall hernia 10/30/2019  . Change in voice 10/30/2019  . History of stroke 09/14/2019  . Spinal stenosis 07/24/2019  . Mixed hyperlipidemia   . OSA (obstructive sleep apnea)   . Dysfunction of eustachian tube 05/15/2019  . Other social stressor 03/29/2019  . 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 Diarrhea 06/20/2018  . Joint pain 06/20/2018  . 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 Medicare annual wellness visit, subsequent 09/15/2017  . SUI (stress urinary incontinence, female) 08/07/2017  . Back pain 06/16/2016  . Advance care planning 01/07/2016  . Sleep apnea   . 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 Allergic rhinitis 11/24/2014  . Adult BMI 30+ 11/24/2014  . Depression 11/24/2014  . Insomnia 08/23/2009  . Headache, migraine 05/10/2009  . HERPES SIMPLEX INFECTION 08/14/2006  . HYPERCHOLESTEROLEMIA 08/14/2006  . Anxiety state 08/14/2006  . Essential hypertension 08/14/2006  . HIATAL HERNIA 08/14/2006  . IRRITABLE BOWEL SYNDROME 08/14/2006  . 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 Primary osteoarthritis of right  knee 08/14/2006  . PLANTAR FASCIITIS 08/14/2006    Lexie Koehl, Annye Rusk MS, CCC-SLP 11/16/2019, 12:53 PM  [facility] [address] [place], [place], [address] Phone: [phone]   Fax:  [phone]   Name: KIERRE DEINES MRN: [id number] Date of Birth: September 05, 1940

## 2019-11-18 ENCOUNTER — Other Ambulatory Visit: Payer: Self-pay

## 2019-11-18 ENCOUNTER — Ambulatory Visit: Payer: Medicare HMO

## 2019-11-18 DIAGNOSIS — R2689 Other abnormalities of gait and mobility: Secondary | ICD-10-CM

## 2019-11-18 DIAGNOSIS — M6281 Muscle weakness (generalized): Secondary | ICD-10-CM | POA: Diagnosis not present

## 2019-11-18 DIAGNOSIS — R262 Difficulty in walking, not elsewhere classified: Secondary | ICD-10-CM | POA: Diagnosis not present

## 2019-11-18 DIAGNOSIS — R498 Other voice and resonance disorders: Secondary | ICD-10-CM | POA: Diagnosis not present

## 2019-11-18 DIAGNOSIS — R471 Dysarthria and anarthria: Secondary | ICD-10-CM | POA: Diagnosis not present

## 2019-11-18 DIAGNOSIS — R2681 Unsteadiness on feet: Secondary | ICD-10-CM | POA: Diagnosis not present

## 2019-11-18 NOTE — Therapy (Signed)
[facility] [address] [place], [place], [address] Phone: [phone]   Fax:  [phone]  Physical Therapy Treatment  Patient Details  Name: [name] [name] MRN: [id number] Date of Birth: [birth year] Referring Provider (PT): Referred by Frann Rider, NP, PCP is Elsie Stain, MD    Encounter Date: 11/18/2019   PT End of Session - 11/18/19 1112    Visit Number 5    Number of Visits 13    Date for PT Re-Evaluation 01/31/20   POC for 6 weeks, Cert for 90 days   Authorization Type Medicare    PT Start Time 1102    PT Stop Time 1145    PT Time Calculation (min) 43 min    Equipment Utilized During Treatment Gait belt    Activity Tolerance Patient tolerated treatment well    Behavior During Therapy [facility] for tasks assessed/performed           Past Medical History:  Diagnosis Date  . Arthritis    knees  . Depression   . Fatty liver   . GERD (gastroesophageal reflux disease)   . Hyperlipidemia   . Hypertension   . IBS (irritable bowel syndrome)   . Obesity   . Pneumonia   . Sleep apnea    uses CPAP    Past Surgical History:  Procedure Laterality Date  . BLADDER SURGERY    . BREAST SURGERY     breast biopsy  . BROW LIFT Bilateral 04/14/2017   Procedure: BLEPHAROPLASTY UPPER EYELID WITH EXCESS SKIN;  Surgeon: Karle Starch, MD;  Location: [facility];  Service: Ophthalmology;  Laterality: Bilateral;  . CATARACT EXTRACTION W/PHACO Left 02/05/2016   Procedure: CATARACT EXTRACTION PHACO AND INTRAOCULAR LENS PLACEMENT (IOC);  Surgeon: Birder Robson, MD;  Location: [facility];  Service: Ophthalmology;  Laterality: Left;  [place] 01:10 AP% 22.3 CDE 15.67 Fluid pack lot # [id number]  . CATARACT EXTRACTION W/PHACO Right 02/26/2016   Procedure: CATARACT EXTRACTION PHACO AND INTRAOCULAR LENS PLACEMENT (IOC);  Surgeon: Birder Robson, MD;  Location: [facility];  Service: Ophthalmology;  Laterality: Right;  [place] 57.4 AP%  24.0 CDE 13.75 Fluid Pack lot # [id number]  . CHOLECYSTECTOMY    . COLONOSCOPY WITH PROPOFOL N/A 12/18/2014   Procedure: COLONOSCOPY WITH PROPOFOL;  Surgeon: Manya Silvas, MD;  Location: [facility];  Service: Endoscopy;  Laterality: N/A;  . DEEP NECK LYMPH NODE BIOPSY / EXCISION    . JOINT REPLACEMENT    . KNEE ARTHROPLASTY Left 06/20/2015   Procedure: COMPUTER ASSISTED TOTAL KNEE ARTHROPLASTY;  Surgeon: Dereck Leep, MD;  Location: [facility];  Service: Orthopedics;  Laterality: Left;  . PTOSIS REPAIR Bilateral 04/14/2017   Procedure: PTOSIS REPAIR RESECT EX;  Surgeon: Karle Starch, MD;  Location: [facility];  Service: Ophthalmology;  Laterality: Bilateral;  sleep apnea  . TONSILLECTOMY    . TOTAL HIP ARTHROPLASTY Right 07/21/2018   Procedure: TOTAL HIP ARTHROPLASTY ANTERIOR APPROACH;  Surgeon: Gaynelle Arabian, MD;  Location: [facility];  Service: Orthopedics;  Laterality: Right;    There were no vitals filed for this visit.   Subjective Assessment - 11/18/19 1105    Subjective Patient reports that she was stressed and didnt get much sleep, and was shaky which is why she missed the last appointment. Reports that she is feeling better.    Pertinent History Obesity, hypertension, hyperlipidemia, Lt sciatic nerve pain, sleep apnea on CPAP.    Limitations Walking;Standing;Sitting    Patient Stated Goals  Feel more like myself    Currently in Pain? No/denies                             [facility] Adult PT Treatment/Exercise - 11/18/19 0001      Ambulation/Gait   Ambulation/Gait Yes    Ambulation/Gait Assistance 5: Supervision    Ambulation/Gait Assistance Details throughout therapy gym with activities    Assistive device None    Gait Pattern Step-through pattern;Decreased arm swing - right;Decreased arm swing - left;Decreased dorsiflexion - right;Decreased dorsiflexion - left;Poor foot clearance - left;Ataxic    Ambulation Surface Level;Indoor      High Level  Balance   High Level Balance Activities Tandem walking    High Level Balance Comments Completed tandem walking x 3 laps on firm surface w/o UE support, and then progressed to completing tandem walking x 3 laps on blue mat. Pt demo increased difficulty with completion on foam require intermittent CGA.       Neuro Re-ed    Neuro Re-ed Details  Completed side stepping on blue mat with alternating toe taps to cones. Completed x 4 laps down across mat. Intermittent UE support required, pt demo increased difficulty with toe tap with LLE.       Exercises   Exercises Other Exercises    Other Exercises  Per patient's request, reviewed scapular retraction. Provided patient with another red theraband and educaitng on tieing it to stationary surface rather than palcing between door and door frame for improved completion.                Balance Exercises - 11/18/19 0001      Balance Exercises: Standing   Standing Eyes Opened Narrow base of support (BOS);Head turns;Foam/compliant surface;Other reps (comment);Limitations    Standing Eyes Opened Limitations completed 2 x 10 reps of horizontal/vertical head turns. PT providing verbal cues for  proper completion in tolerated range due to neck pain.     Standing Eyes Closed Narrow base of support (BOS);Foam/compliant surface;3 reps;30 secs    Balance Beam Completed static standing on balance beam, completed with EC 2 x 1 min. Progressed to completing side stepping along balance beam x 6 laps in // bars. Completed initially w/ UE and progressed to no UE support.                PT Short Term Goals - 11/02/19 1329      PT SHORT TERM GOAL #1   Title Patient will be independent with Initial HEP    Time 3    Period Weeks    Status New    Target Date 11/23/19      PT SHORT TERM GOAL #2   Title Patient will improve 30 second chair stand test to >/= 12 reps w/ UE support to demo improved BLE strength    Baseline 10 sit <> stands    Time 3    Period  Weeks    Status New    Target Date 11/23/19      PT SHORT TERM GOAL #3   Title Patient will improve FGA to >/= 20/30 to demonstrate improved balance and reduced risk for falls    Baseline 17/30    Time 3    Period Weeks    Status New    Target Date 11/23/19      PT SHORT TERM GOAL #4   Title Patient will demonstrate ability to ambulate >/= 500 ft  w/o AD to demonstrate improved community mobility and ability to ambulate within neighborhood    Time 3    Period Weeks    Status New    Target Date 11/23/19             PT Long Term Goals - 11/02/19 1332      PT LONG TERM GOAL #1   Title Pt will be independent with Final HEP    Time 6    Period Weeks    Status New    Target Date 12/14/19      PT LONG TERM GOAL #2   Title Patient will improve gait speed to >/= 3.0 ft/sec to demonstrate improved community mobility    Baseline 2.2 ft/sec    Time 6    Period Weeks    Status New    Target Date 12/14/19      PT LONG TERM GOAL #3   Title Patient will improve FGA to >/= 24/30 to demonstrate reduced risk for falls    Baseline 17/30    Time 6    Period Weeks    Status New    Target Date 12/14/19      PT LONG TERM GOAL #4   Title Patient will demonstrate ability to complete TUG <9 secs w/o AD to demonstrate improved mobility    Baseline 10.97    Time 6    Period Weeks    Status New    Target Date 12/14/19      PT LONG TERM GOAL #5   Title Patient will demonstrate ability to ambulate >/= 600 ft outdoors on unlevel surfaces, Mod I, to demonstrate improved ability to ambulate around neighborhood    Time 6    Period Weeks    Status New    Target Date 12/14/19                 Plan - 11/18/19 1240    Clinical Impression Statement Today's skilled PT session included review of scapular retraction with theraband to ensure proper completion. Continued balance actvities focused on complaint surfaces, promoting SLS, and vision removed.Due to reports of pain in cervical  area with head turns, PT providing continued education on movement in range within pain tolerance and working to improve overall mobility.Patient will continue to benefit from skilled PT services to progress toward all LTGs.    Personal Factors and Comorbidities Comorbidity 3+    Comorbidities Obesity, hypertension, hyperlipidemia, Lt sciatic nerve pain, sleep apnea on CPAP    Examination-Activity Limitations Squat;Transfers;Stairs;Stand    Examination-Participation Restrictions Community Activity;Cleaning    Stability/Clinical Decision Making Evolving/Moderate complexity    Rehab Potential Good    PT Frequency 2x / week    PT Duration 6 weeks    PT Treatment/Interventions ADLs/Self Care Home Management;Moist Heat;Cryotherapy;Electrical Stimulation;DME Instruction;Gait training;Stair training;Functional mobility training;Therapeutic activities;Therapeutic exercise;Balance training;Neuromuscular re-education;Patient/family education;Orthotic Fit/Training;Passive range of motion    PT Next Visit Plan Check STGs dynamic balance, functional strengthening, corner balance with eyes closed/head motions. Scifit or Nustep?    Consulted and Agree with Plan of Care Patient           Patient will benefit from skilled therapeutic intervention in order to improve the following deficits and impairments:  Abnormal gait, Decreased balance, Decreased endurance, Difficulty walking, Decreased activity tolerance, Decreased safety awareness, Pain, Postural dysfunction, Decreased strength  Visit Diagnosis: Muscle weakness (generalized)  Other abnormalities of gait and mobility  Difficulty in walking, not elsewhere classified  Unsteadiness on feet  Problem List Patient Active Problem List   Diagnosis Date Noted  . Abdominal wall hernia 10/30/2019  . Change in voice 10/30/2019  . History of stroke 09/14/2019  . Spinal stenosis 07/24/2019  . Mixed hyperlipidemia   . OSA (obstructive sleep apnea)   .  Dysfunction of eustachian tube 05/15/2019  . Other social stressor 03/29/2019  . 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 Diarrhea 06/20/2018  . Joint pain 06/20/2018  . 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 Medicare annual wellness visit, subsequent 09/15/2017  . SUI (stress urinary incontinence, female) 08/07/2017  . Back pain 06/16/2016  . Advance care planning 01/07/2016  . Sleep apnea   . 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 Allergic rhinitis 11/24/2014  . Adult BMI 30+ 11/24/2014  . Depression 11/24/2014  . Insomnia 08/23/2009  . Headache, migraine 05/10/2009  . HERPES SIMPLEX INFECTION 08/14/2006  . HYPERCHOLESTEROLEMIA 08/14/2006  . Anxiety state 08/14/2006  . Essential hypertension 08/14/2006  . HIATAL HERNIA 08/14/2006  . IRRITABLE BOWEL SYNDROME 08/14/2006  . 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 Primary osteoarthritis of right knee 08/14/2006  . PLANTAR FASCIITIS 08/14/2006    Jones Bales, PT, DPT 11/18/2019, 12:44 PM  [facility] [address] [place], [place], [address] Phone: [phone]   Fax:  [phone]  Name: [name] [name] MRN: [id number] Date of Birth: 1941/03/15

## 2019-11-22 DIAGNOSIS — R490 Dysphonia: Secondary | ICD-10-CM | POA: Diagnosis not present

## 2019-11-22 DIAGNOSIS — J341 Cyst and mucocele of nose and nasal sinus: Secondary | ICD-10-CM | POA: Diagnosis not present

## 2019-11-22 DIAGNOSIS — I69391 Dysphagia following cerebral infarction: Secondary | ICD-10-CM | POA: Diagnosis not present

## 2019-11-22 DIAGNOSIS — J383 Other diseases of vocal cords: Secondary | ICD-10-CM | POA: Diagnosis not present

## 2019-11-22 DIAGNOSIS — J384 Edema of larynx: Secondary | ICD-10-CM | POA: Diagnosis not present

## 2019-11-22 DIAGNOSIS — R6889 Other general symptoms and signs: Secondary | ICD-10-CM | POA: Diagnosis not present

## 2019-11-22 DIAGNOSIS — J392 Other diseases of pharynx: Secondary | ICD-10-CM | POA: Diagnosis not present

## 2019-11-23 DIAGNOSIS — H26491 Other secondary cataract, right eye: Secondary | ICD-10-CM | POA: Diagnosis not present

## 2019-11-25 ENCOUNTER — Ambulatory Visit: Payer: Medicare HMO

## 2019-11-25 ENCOUNTER — Ambulatory Visit: Payer: Medicare HMO | Attending: Family Medicine | Admitting: Physical Therapy

## 2019-11-25 ENCOUNTER — Other Ambulatory Visit: Payer: Self-pay

## 2019-11-25 DIAGNOSIS — R498 Other voice and resonance disorders: Secondary | ICD-10-CM | POA: Insufficient documentation

## 2019-11-25 DIAGNOSIS — R2689 Other abnormalities of gait and mobility: Secondary | ICD-10-CM | POA: Diagnosis not present

## 2019-11-25 DIAGNOSIS — R262 Difficulty in walking, not elsewhere classified: Secondary | ICD-10-CM | POA: Diagnosis not present

## 2019-11-25 DIAGNOSIS — M6281 Muscle weakness (generalized): Secondary | ICD-10-CM

## 2019-11-25 DIAGNOSIS — R2681 Unsteadiness on feet: Secondary | ICD-10-CM | POA: Insufficient documentation

## 2019-11-25 DIAGNOSIS — R278 Other lack of coordination: Secondary | ICD-10-CM | POA: Diagnosis not present

## 2019-11-25 DIAGNOSIS — R471 Dysarthria and anarthria: Secondary | ICD-10-CM | POA: Insufficient documentation

## 2019-11-25 NOTE — Therapy (Signed)
[facility] [address] [place], [place], [address] Phone: [phone]   Fax:  [phone]  Speech Language Pathology Treatment  Patient Details  Name: [name] [name] MRN: [id number] Date of Birth: 1940-12-26 Referring Provider (SLP): Frann Rider, NP   Encounter Date: 11/25/2019   End of Session - 11/25/19 1745    Visit Number 4    Number of Visits 17    Date for SLP Re-Evaluation 12/28/19    SLP Start Time 5    SLP Stop Time  1400    SLP Time Calculation (min) 41 min    Activity Tolerance Patient tolerated treatment well           Past Medical History:  Diagnosis Date  . Arthritis    knees  . Depression   . Fatty liver   . GERD (gastroesophageal reflux disease)   . Hyperlipidemia   . Hypertension   . IBS (irritable bowel syndrome)   . Obesity   . Pneumonia   . Sleep apnea    uses CPAP    Past Surgical History:  Procedure Laterality Date  . BLADDER SURGERY    . BREAST SURGERY     breast biopsy  . BROW LIFT Bilateral 04/14/2017   Procedure: BLEPHAROPLASTY UPPER EYELID WITH EXCESS SKIN;  Surgeon: Karle Starch, MD;  Location: [facility];  Service: Ophthalmology;  Laterality: Bilateral;  . CATARACT EXTRACTION W/PHACO Left 02/05/2016   Procedure: CATARACT EXTRACTION PHACO AND INTRAOCULAR LENS PLACEMENT (IOC);  Surgeon: Birder Robson, MD;  Location: [facility];  Service: Ophthalmology;  Laterality: Left;  [place] 01:10 AP% 22.3 CDE 15.67 Fluid pack lot # [id number]  . CATARACT EXTRACTION W/PHACO Right 02/26/2016   Procedure: CATARACT EXTRACTION PHACO AND INTRAOCULAR LENS PLACEMENT (IOC);  Surgeon: Birder Robson, MD;  Location: [facility];  Service: Ophthalmology;  Laterality: Right;  [place] 57.4 AP% 24.0 CDE 13.75 Fluid Pack lot # [id number]  . CHOLECYSTECTOMY    . COLONOSCOPY WITH PROPOFOL N/A 12/18/2014   Procedure: COLONOSCOPY WITH PROPOFOL;  Surgeon: Manya Silvas, MD;  Location: [facility]  ENDOSCOPY;  Service: Endoscopy;  Laterality: N/A;  . DEEP NECK LYMPH NODE BIOPSY / EXCISION    . JOINT REPLACEMENT    . KNEE ARTHROPLASTY Left 06/20/2015   Procedure: COMPUTER ASSISTED TOTAL KNEE ARTHROPLASTY;  Surgeon: Dereck Leep, MD;  Location: [facility];  Service: Orthopedics;  Laterality: Left;  . PTOSIS REPAIR Bilateral 04/14/2017   Procedure: PTOSIS REPAIR RESECT EX;  Surgeon: Karle Starch, MD;  Location: [facility];  Service: Ophthalmology;  Laterality: Bilateral;  sleep apnea  . TONSILLECTOMY    . TOTAL HIP ARTHROPLASTY Right 07/21/2018   Procedure: TOTAL HIP ARTHROPLASTY ANTERIOR APPROACH;  Surgeon: Gaynelle Arabian, MD;  Location: [facility];  Service: Orthopedics;  Laterality: Right;    There were no vitals filed for this visit.          ADULT SLP TREATMENT - 11/25/19 1330      General Information   Behavior/Cognition Alert;Cooperative;Pleasant mood      Treatment Provided   Treatment provided Cognitive-Linquistic      Cognitive-Linquistic Treatment   Treatment focused on Dysarthria;Voice    Skilled Treatment Pt saw Dr. 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 SLP reviewed this with pt and explained each diagnosis. Pt took notes and asked questions and SLP answered pt questions. SLP worked with pt on straw phonation tasks- pt cont with  rough voice for 70% of tasks - humming and lip-pursed /u/ was best task to elicit WNL voicing quality "I don't feel like there's somethign in my throat now," pt stated during WNL voicing. SLP suggested her globus feeling might be from improper vocal vibratory habits. SLP also taught pt abdominal breathing (AB) with limited effect in sitting. Time constraints negated practice in supine so SLP explained supine practice and pt going to attempt at home. See "pt instructions".       Assessment / Recommendations / Plan   Plan Continue with current plan of care      Progression Toward  Goals   Progression toward goals Progressing toward goals            SLP Education - 11/25/19 1744    Education Details abdominal breathing, explanation of diagnoses from her voice eval, straw phonation tasks in water and out of water    Person(s) Educated Patient    Methods Explanation;Demonstration;Verbal cues    Comprehension Verbalized understanding;Returned demonstration;Verbal cues required;Need further instruction            SLP Short Term Goals - 11/25/19 1747      SLP SHORT TERM GOAL #1   Title Pt will complete HEP for labial weakness with mod I over 2 sessions    Time 3    Period Weeks    Status On-going      SLP SHORT TERM GOAL #2   Title Pt will produce sibalant fricatives accurately at sentence level with rare min A 18/20 sentences    Time 3    Period Weeks    Status On-going      SLP SHORT TERM GOAL #3   Title Pt will follow 3 reflux precautions over 3 sessions with mod I    Time 3    Period Weeks    Status On-going      SLP SHORT TERM GOAL #4   Title Pt will follow 3 vocal hygiene strategies over 3 sessions with mod I    Baseline voice goals to be modified pending ENT consult    Time 3    Period Weeks    Status On-going      SLP SHORT TERM GOAL #5   Title pt to demo abdominal breathing at rest 75% success over 5 minutes    Time 3    Period Weeks    Status New      Additional Short Term Goals   Additional Short Term Goals Yes      SLP SHORT TERM GOAL #6   Title pt to complete tasks mitigating muscle tension dysphonia with rare min A over three sessions    Time 3    Period Weeks    Status New            SLP Long Term Goals - 11/25/19 1748      SLP LONG TERM GOAL #1   Title Pt will produce sibalant fricatives accurately in simple conversation over 15 minutes, self corrected as needed, with occasional min A over 2 session    Time 7    Period Weeks    Status On-going      SLP LONG TERM GOAL #2   Title Pt will achieve WNL phonation  using strategies 15/20 sentences with occasional min A    Baseline may be modified following ENT consult    Time 7    Period Weeks    Status On-going      SLP LONG  TERM GOAL #3   Title Pt will follow 4 vocal hygiene strategies over 3 sessions with rare min A    Time 7    Period Weeks    Status On-going      SLP LONG TERM GOAL #4   Title Pt will improve Communicative Effectiveness Survey score by 2 points    Time 7    Period Weeks    Status On-going      SLP LONG TERM GOAL #5   Title pt to use WNL voice quality in 8 minutes simple conversation x3 sessions    Time 7    Period Weeks    Status New      Additional Long Term Goals   Additional Long Term Goals Yes      SLP LONG TERM GOAL #6   Title pt to demo PHorTE exercises with rare min A in 3 sessions    Time 7    Period Weeks    Status New            Plan - 11/25/19 1745    Clinical Impression Statement Aarini presents with mild dysathria and moderate dysphonia, 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 SLP educated pt about those diagnoses today as well as trained pt in basics of abdominal breathing and some straw phonation tasks. Skilled ST cont necessary to maximize pt's vocal quality and speech intelligilibty.    Speech Therapy Frequency 2x / week    Duration --   8 weeks or 17 visits   Treatment/Interventions Aspiration precaution training;Environmental controls;Cueing hierarchy;Oral motor exercises;SLP instruction and feedback;Compensatory strategies;Functional tasks;Cognitive reorganization;Compensatory techniques;Diet toleration management by SLP;Internal/external aids;Multimodal communcation approach;Patient/family education    Potential to Achieve Goals Good           Patient will benefit from skilled therapeutic intervention in order to improve the following deficits and impairments:   Other voice and resonance disorders  Dysarthria and anarthria    Problem List Patient Active  Problem List   Diagnosis Date Noted  . Abdominal wall hernia 10/30/2019  . Change in voice 10/30/2019  . History of stroke 09/14/2019  . Spinal stenosis 07/24/2019  . Mixed hyperlipidemia   . OSA (obstructive sleep apnea)   . Dysfunction of eustachian tube 05/15/2019  . Other social stressor 03/29/2019  . 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 Diarrhea 06/20/2018  . Joint pain 06/20/2018  . 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 Medicare annual wellness visit, subsequent 09/15/2017  . SUI (stress urinary incontinence, female) 08/07/2017  . Back pain 06/16/2016  . Advance care planning 01/07/2016  . Sleep apnea   . 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 Allergic rhinitis 11/24/2014  . Adult BMI 30+ 11/24/2014  . Depression 11/24/2014  . Insomnia 08/23/2009  . Headache, migraine 05/10/2009  . HERPES SIMPLEX INFECTION 08/14/2006  . HYPERCHOLESTEROLEMIA 08/14/2006  . Anxiety state 08/14/2006  . Essential hypertension 08/14/2006  . HIATAL HERNIA 08/14/2006  . IRRITABLE BOWEL SYNDROME 08/14/2006  . 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 Primary osteoarthritis of right knee 08/14/2006  . PLANTAR FASCIITIS 08/14/2006    [place] ,[place], CCC-SLP  11/25/2019, 10:55 PM  [facility] [address] [place], [place], [address] Phone: [phone]   Fax:  [phone]   Name: [name] [name] MRN: [id number] Date of Birth: 1940/08/05

## 2019-11-25 NOTE — Therapy (Signed)
[facility] [address] [place], [place], [address] Phone: [phone]   Fax:  [phone]  Physical Therapy Treatment  Patient Details  Name: [name] [name] MRN: [id number] Date of Birth: 03-11-1941 Referring Provider (PT): Referred by Frann Rider, NP, PCP is Elsie Stain, MD    Encounter Date: 11/25/2019   PT End of Session - 11/25/19 1546    Visit Number 6    Number of Visits 13    Date for PT Re-Evaluation 01/31/20   POC for 6 weeks, Cert for 90 days   Authorization Type Medicare    PT Start Time 1233    PT Stop Time 1315    PT Time Calculation (min) 42 min    Equipment Utilized During Treatment Gait belt    Activity Tolerance Patient tolerated treatment well    Behavior During Therapy [facility] for tasks assessed/performed           Past Medical History:  Diagnosis Date  . Arthritis    knees  . Depression   . Fatty liver   . GERD (gastroesophageal reflux disease)   . Hyperlipidemia   . Hypertension   . IBS (irritable bowel syndrome)   . Obesity   . Pneumonia   . Sleep apnea    uses CPAP    Past Surgical History:  Procedure Laterality Date  . BLADDER SURGERY    . BREAST SURGERY     breast biopsy  . BROW LIFT Bilateral 04/14/2017   Procedure: BLEPHAROPLASTY UPPER EYELID WITH EXCESS SKIN;  Surgeon: Karle Starch, MD;  Location: [facility];  Service: Ophthalmology;  Laterality: Bilateral;  . CATARACT EXTRACTION W/PHACO Left 02/05/2016   Procedure: CATARACT EXTRACTION PHACO AND INTRAOCULAR LENS PLACEMENT (IOC);  Surgeon: Birder Robson, MD;  Location: [facility];  Service: Ophthalmology;  Laterality: Left;  [place] 01:10 AP% 22.3 CDE 15.67 Fluid pack lot # [id number]  . CATARACT EXTRACTION W/PHACO Right 02/26/2016   Procedure: CATARACT EXTRACTION PHACO AND INTRAOCULAR LENS PLACEMENT (IOC);  Surgeon: Birder Robson, MD;  Location: [facility];  Service: Ophthalmology;  Laterality: Right;  [place] 57.4 AP%  24.0 CDE 13.75 Fluid Pack lot # [id number]  . CHOLECYSTECTOMY    . COLONOSCOPY WITH PROPOFOL N/A 12/18/2014   Procedure: COLONOSCOPY WITH PROPOFOL;  Surgeon: Manya Silvas, MD;  [facility] ENDOSCOPY;  Service: Endoscopy;  Laterality: N/A;  . DEEP NECK LYMPH NODE BIOPSY / EXCISION    . JOINT REPLACEMENT    . KNEE ARTHROPLASTY Left 06/20/2015   Procedure: COMPUTER ASSISTED TOTAL KNEE ARTHROPLASTY;  Surgeon: Dereck Leep, MD;  Location: [facility];  Service: Orthopedics;  Laterality: Left;  . PTOSIS REPAIR Bilateral 04/14/2017   Procedure: PTOSIS REPAIR RESECT EX;  Surgeon: Karle Starch, MD;  Location: [facility];  Service: Ophthalmology;  Laterality: Bilateral;  sleep apnea  . TONSILLECTOMY    . TOTAL HIP ARTHROPLASTY Right 07/21/2018   Procedure: TOTAL HIP ARTHROPLASTY ANTERIOR APPROACH;  Surgeon: Gaynelle Arabian, MD;  Location: [facility];  Service: Orthopedics;  Laterality: Right;    There were no vitals filed for this visit.   Subjective Assessment - 11/25/19 1236    Subjective Still notices that her balance is off. Wants to order a balance foam pad.    Pertinent History Obesity, hypertension, hyperlipidemia, Lt sciatic nerve pain, sleep apnea on CPAP.    Limitations Walking;Standing;Sitting    Patient Stated Goals Feel more like myself    Currently in Pain? No/denies  [facility] PT Assessment - 11/25/19 1245      Ambulation/Gait   Stairs Yes    Stairs Assistance 5: Supervision    Stairs Assistance Details (indicate cue type and reason) descends with RLE first in step to pattern due to incr R knee pain    Stair Management Technique Step to pattern;Alternating pattern;Forwards      Functional Gait  Assessment   Gait assessed  Yes    Gait Level Surface Walks 20 ft in less than 7 sec but greater than 5.5 sec, uses assistive device, slower speed, mild gait deviations, or deviates 6-10 in outside of the 12 in walkway width.    Change in Gait Speed Able to smoothly  change walking speed without loss of balance or gait deviation. Deviate no more than 6 in outside of the 12 in walkway width.    Gait with Horizontal Head Turns Performs head turns smoothly with slight change in gait velocity (eg, minor disruption to smooth gait path), deviates 6-10 in outside 12 in walkway width, or uses an assistive device.    Gait with Vertical Head Turns Performs task with slight change in gait velocity (eg, minor disruption to smooth gait path), deviates 6 - 10 in outside 12 in walkway width or uses assistive device    Gait and Pivot Turn Pivot turns safely in greater than 3 sec and stops with no loss of balance, or pivot turns safely within 3 sec and stops with mild imbalance, requires small steps to catch balance.    Step Over Obstacle Is able to step over 2 stacked shoe boxes taped together (9 in total height) without changing gait speed. No evidence of imbalance.    Gait with Narrow Base of Support Ambulates 4-7 steps.    Gait with Eyes Closed Walks 20 ft, slow speed, abnormal gait pattern, evidence for imbalance, deviates 10-15 in outside 12 in walkway width. Requires more than 9 sec to ambulate 20 ft.    Ambulating Backwards Walks 20 ft, uses assistive device, slower speed, mild gait deviations, deviates 6-10 in outside 12 in walkway width.    Steps Two feet to a stair, must use rail.    Total Score 19    FGA comment: 19/30                         [facility] Adult PT Treatment/Exercise - 11/25/19 1245      Transfers   Sit to Stand 6: Modified independent (Device/Increase time);With upper extremity assist    Stand to Sit 6: Modified independent (Device/Increase time);With upper extremity assist    Comments 30 second chair stand: 12 sit <> stands       Ambulation/Gait   Ambulation/Gait Yes    Ambulation/Gait Assistance 5: Supervision    Ambulation/Gait Assistance Details outdoor over paved surfaces, cues for incr L foot clearance due to intermittent  scuffing, pt needing seated rest break after bout of gait    Ambulation Distance (Feet) 500 Feet    Assistive device None    Gait Pattern Step-through pattern;Decreased arm swing - right;Decreased arm swing - left;Decreased dorsiflexion - right;Decreased dorsiflexion - left;Poor foot clearance - left;Ataxic    Ambulation Surface Unlevel;Outdoor;Paved               Balance Exercises - 11/25/19 0001      Balance Exercises: Standing   Standing Eyes Closed Narrow base of support (BOS);Foam/compliant surface;4 reps;30 secs    Other Standing Exercises on  foam feet apart eyes closed and 2 x 10 reps head turns             PT Education - 11/25/19 1545    Education Details progress towards goals, where to purchase balance pad for home use (sent handout from [employer]) - educated pt to bring into next session to go over corner balance exercises for home before using at home    Person(s) Educated Patient    Methods Explanation    Comprehension Verbalized understanding            PT Short Term Goals - 11/25/19 1244      PT SHORT TERM GOAL #1   Title Patient will be independent with Initial HEP    Time 3    Period Weeks    Status On-going    Target Date 11/23/19      PT SHORT TERM GOAL #2   Title Patient will improve 30 second chair stand test to >/= 12 reps w/ UE support to demo improved BLE strength    Baseline 10 sit <> stands, 12 sit <> stands on 11/25/19    Time 3    Period Weeks    Status Achieved    Target Date 11/23/19      PT SHORT TERM GOAL #3   Title Patient will improve FGA to >/= 20/30 to demonstrate improved balance and reduced risk for falls    Baseline 17/30, 19/30 on 11/25/19    Time 3    Period Weeks    Status Not Met    Target Date 11/23/19      PT SHORT TERM GOAL #4   Title Patient will demonstrate ability to ambulate >/= 500 ft w/o AD to demonstrate improved community mobility and ability to ambulate within neighborhood    Time 3    Period Weeks     Status Achieved    Target Date 11/23/19             PT Long Term Goals - 11/02/19 1332      PT LONG TERM GOAL #1   Title Pt will be independent with Final HEP    Time 6    Period Weeks    Status New    Target Date 12/14/19      PT LONG TERM GOAL #2   Title Patient will improve gait speed to >/= 3.0 ft/sec to demonstrate improved community mobility    Baseline 2.2 ft/sec    Time 6    Period Weeks    Status New    Target Date 12/14/19      PT LONG TERM GOAL #3   Title Patient will improve FGA to >/= 24/30 to demonstrate reduced risk for falls    Baseline 17/30    Time 6    Period Weeks    Status New    Target Date 12/14/19      PT LONG TERM GOAL #4   Title Patient will demonstrate ability to complete TUG <9 secs w/o AD to demonstrate improved mobility    Baseline 10.97    Time 6    Period Weeks    Status New    Target Date 12/14/19      PT LONG TERM GOAL #5   Title Patient will demonstrate ability to ambulate >/= 600 ft outdoors on unlevel surfaces, Mod I, to demonstrate improved ability to ambulate around neighborhood    Time 6    Period Weeks    Status New  Target Date 12/14/19                 Plan - 11/25/19 1550    Clinical Impression Statement Focus of today's session was assessing pt's STGs. Pt has met 2 out of 4 STGs. Pt able to ambulate 500' outdoors over paved surfaces with supervision and no LOB. Pt also improved 30 second chair stand to 12 sit <> stands (previously was 10), indicating improved BLE strength. Performed the FGA with pt scoring 19/30, pt with improvement from eval (17/30), but not quite to goal level. Due to time constraints, unable to review HEP, will review at next session and revise as appropriate.  Will continue to progress towards LTGs.    Personal Factors and Comorbidities Comorbidity 3+    Comorbidities Obesity, hypertension, hyperlipidemia, Lt sciatic nerve pain, sleep apnea on CPAP    Examination-Activity Limitations  Squat;Transfers;Stairs;Stand    Examination-Participation Restrictions Community Activity;Cleaning    Stability/Clinical Decision Making Evolving/Moderate complexity    Rehab Potential Good    PT Frequency 2x / week    PT Duration 6 weeks    PT Treatment/Interventions ADLs/Self Care Home Management;Moist Heat;Cryotherapy;Electrical Stimulation;DME Instruction;Gait training;Stair training;Functional mobility training;Therapeutic activities;Therapeutic exercise;Balance training;Neuromuscular re-education;Patient/family education;Orthotic Fit/Training;Passive range of motion    PT Next Visit Plan review HEP, pt was intersted in purchasing a balance pad from [facility] so she should have it with her at next session, dynamic balance, functional strengthening, corner balance with eyes closed/head motions. Scifit or Nustep?    Consulted and Agree with Plan of Care Patient           Patient will benefit from skilled therapeutic intervention in order to improve the following deficits and impairments:  Abnormal gait, Decreased balance, Decreased endurance, Difficulty walking, Decreased activity tolerance, Decreased safety awareness, Pain, Postural dysfunction, Decreased strength  Visit Diagnosis: Muscle weakness (generalized)  Other abnormalities of gait and mobility  Difficulty in walking, not elsewhere classified  Unsteadiness on feet     Problem List Patient Active Problem List   Diagnosis Date Noted  . Abdominal wall hernia 10/30/2019  . Change in voice 10/30/2019  . History of stroke 09/14/2019  . Spinal stenosis 07/24/2019  . Mixed hyperlipidemia   . OSA (obstructive sleep apnea)   . Dysfunction of eustachian tube 05/15/2019  . Other social stressor 03/29/2019  . 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 Diarrhea 06/20/2018  . Joint pain 06/20/2018  . 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 Medicare annual wellness visit, subsequent 09/15/2017  . SUI  (stress urinary incontinence, female) 08/07/2017  . Back pain 06/16/2016  . Advance care planning 01/07/2016  . Sleep apnea   . 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 Allergic rhinitis 11/24/2014  . Adult BMI 30+ 11/24/2014  . Depression 11/24/2014  . Insomnia 08/23/2009  . Headache, migraine 05/10/2009  . HERPES SIMPLEX INFECTION 08/14/2006  . HYPERCHOLESTEROLEMIA 08/14/2006  . Anxiety state 08/14/2006  . Essential hypertension 08/14/2006  . HIATAL HERNIA 08/14/2006  . IRRITABLE BOWEL SYNDROME 08/14/2006  . 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 Primary osteoarthritis of right knee 08/14/2006  . PLANTAR FASCIITIS 08/14/2006    Arliss Journey, PT, DPT  11/25/2019, 3:55 PM  [facility] [address] [place], [place], [address] Phone: [phone]   Fax:  [phone]  Name: [name] [name] MRN: [id number] Date of Birth: 1940/06/29

## 2019-11-25 NOTE — Patient Instructions (Addendum)
   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  =====================================================================   ABDOMINAL BREATHING   Lay on your back with a plastic cup on your belly-- watching the cup and your belly move up with inhalation and down with exhalations . Shoulders down  . Breathe in through your nose and fill your belly with air, watching the cup move upward . Breathe out through your mouth and watch the cup move down as your belly moves downward. (An audible "sh"  may help)   Think of your belly as a balloon, when you fill with air (inhale), the balloon gets bigger. As the air goes out (exhale), the balloon deflates.

## 2019-11-30 ENCOUNTER — Ambulatory Visit: Payer: Medicare HMO | Admitting: Speech Pathology

## 2019-11-30 ENCOUNTER — Other Ambulatory Visit: Payer: Self-pay

## 2019-11-30 ENCOUNTER — Ambulatory Visit: Payer: Medicare HMO

## 2019-11-30 ENCOUNTER — Encounter: Payer: Self-pay | Admitting: Speech Pathology

## 2019-11-30 DIAGNOSIS — R2681 Unsteadiness on feet: Secondary | ICD-10-CM

## 2019-11-30 DIAGNOSIS — R2689 Other abnormalities of gait and mobility: Secondary | ICD-10-CM | POA: Diagnosis not present

## 2019-11-30 DIAGNOSIS — R498 Other voice and resonance disorders: Secondary | ICD-10-CM

## 2019-11-30 DIAGNOSIS — R262 Difficulty in walking, not elsewhere classified: Secondary | ICD-10-CM | POA: Diagnosis not present

## 2019-11-30 DIAGNOSIS — R278 Other lack of coordination: Secondary | ICD-10-CM | POA: Diagnosis not present

## 2019-11-30 DIAGNOSIS — R471 Dysarthria and anarthria: Secondary | ICD-10-CM | POA: Diagnosis not present

## 2019-11-30 DIAGNOSIS — M6281 Muscle weakness (generalized): Secondary | ICD-10-CM | POA: Diagnosis not present

## 2019-11-30 NOTE — Therapy (Signed)
[facility] [address] [place], [place], [address] Phone: [phone]   Fax:  [phone]  Speech Language Pathology Treatment  Patient Details  Name: [name] [name] MRN: [id number] Date of Birth: Jul 16, 1940 Referring Provider (SLP): Frann Rider, NP   Encounter Date: 11/30/2019   End of Session - 11/30/19 1150    Visit Number 5    Number of Visits 17    Date for SLP Re-Evaluation 12/28/19    SLP Start Time 1100    SLP Stop Time  1140    SLP Time Calculation (min) 40 min    Activity Tolerance Patient tolerated treatment well           Past Medical History:  Diagnosis Date  . Arthritis    knees  . Depression   . Fatty liver   . GERD (gastroesophageal reflux disease)   . Hyperlipidemia   . Hypertension   . IBS (irritable bowel syndrome)   . Obesity   . Pneumonia   . Sleep apnea    uses CPAP    Past Surgical History:  Procedure Laterality Date  . BLADDER SURGERY    . BREAST SURGERY     breast biopsy  . BROW LIFT Bilateral 04/14/2017   Procedure: BLEPHAROPLASTY UPPER EYELID WITH EXCESS SKIN;  Surgeon: Karle Starch, MD;  Location: [facility];  Service: Ophthalmology;  Laterality: Bilateral;  . CATARACT EXTRACTION W/PHACO Left 02/05/2016   Procedure: CATARACT EXTRACTION PHACO AND INTRAOCULAR LENS PLACEMENT (IOC);  Surgeon: Birder Robson, MD;  Location: [facility];  Service: Ophthalmology;  Laterality: Left;  [place] 01:10 AP% 22.3 CDE 15.67 Fluid pack lot # [id number]  . CATARACT EXTRACTION W/PHACO Right 02/26/2016   Procedure: CATARACT EXTRACTION PHACO AND INTRAOCULAR LENS PLACEMENT (IOC);  Surgeon: Birder Robson, MD;  Location: [facility];  Service: Ophthalmology;  Laterality: Right;  [place] 57.4 AP% 24.0 CDE 13.75 Fluid Pack lot # [id number]  . CHOLECYSTECTOMY    . COLONOSCOPY WITH PROPOFOL N/A 12/18/2014   Procedure: COLONOSCOPY WITH PROPOFOL;  Surgeon: Manya Silvas, MD;  Location[facility]  ENDOSCOPY;  Service: Endoscopy;  Laterality: N/A;  . DEEP NECK LYMPH NODE BIOPSY / EXCISION    . JOINT REPLACEMENT    . KNEE ARTHROPLASTY Left 06/20/2015   Procedure: COMPUTER ASSISTED TOTAL KNEE ARTHROPLASTY;  Surgeon: Dereck Leep, MD;  Location: [facility];  Service: Orthopedics;  Laterality: Left;  . PTOSIS REPAIR Bilateral 04/14/2017   Procedure: PTOSIS REPAIR RESECT EX;  Surgeon: Karle Starch, MD;  Location: [facility];  Service: Ophthalmology;  Laterality: Bilateral;  sleep apnea  . TONSILLECTOMY    . TOTAL HIP ARTHROPLASTY Right 07/21/2018   Procedure: TOTAL HIP ARTHROPLASTY ANTERIOR APPROACH;  Surgeon: Gaynelle Arabian, MD;  Location: [facility];  Service: Orthopedics;  Laterality: Right;    There were no vitals filed for this visit.          ADULT SLP TREATMENT - 11/30/19 1114      General Information   Behavior/Cognition Alert;Cooperative;Pleasant mood      Cognitive-Linquistic Treatment   Treatment focused on Voice;Patient/family/caregiver education    Skilled Treatment Abdominal breathing targeted for voice support and to reduce muscle tension. Pt requied occasional min A to complete abdominal breathing in isolation. Pt trained in [place] with abdominal breathing with rare min A. She utilized abdominal breathing with projecting volume at sentence level. Amayra achieved clear, WNL phonation 10/12 sentences with usual min verbal cues. She required frequent verbal cues and  demonstrattion to carryover breath support, volume and clear phonation in conversation 50% of utterances      Assessment / Recommendations / Plan   Plan Continue with current plan of care      Progression Toward Goals   Progression toward goals Progressing toward goals            SLP Education - 11/30/19 1147    Education Details abdominal breathing, SOVTE, project with out strain    [employer]) Educated Patient    Methods Explanation;Demonstration;Verbal cues    Comprehension Verbalized  understanding;Returned demonstration;Verbal cues required;Need further instruction            SLP Short Term Goals - 11/30/19 1149      SLP SHORT TERM GOAL #1   Title Pt will complete HEP for labial weakness with mod I over 2 sessions    Time 2    Period Weeks    Status On-going      SLP SHORT TERM GOAL #2   Title Pt will produce sibalant fricatives accurately at sentence level with rare min A 18/20 sentences    Time 2    Period Weeks    Status On-going      SLP SHORT TERM GOAL #3   Title Pt will follow 3 reflux precautions over 3 sessions with mod I    Time 2    Period Weeks    Status On-going      SLP SHORT TERM GOAL #4   Title Pt will follow 3 vocal hygiene strategies over 3 sessions with mod I    Baseline voice goals to be modified pending ENT consult    Time 2    Period Weeks    Status On-going      SLP SHORT TERM GOAL #5   Title pt to demo abdominal breathing at rest 75% success over 5 minutes    Time 3    Period Weeks    Status New      SLP SHORT TERM GOAL #6   Title pt to complete tasks mitigating muscle tension dysphonia with rare min A over three sessions    Time 2    Period Weeks    Status New            SLP Long Term Goals - 11/30/19 1149      SLP LONG TERM GOAL #1   Title Pt will produce sibalant fricatives accurately in simple conversation over 15 minutes, self corrected as needed, with occasional min A over 2 session    Time 6    Period Weeks    Status On-going      SLP LONG TERM GOAL #2   Title Pt will achieve WNL phonation using strategies 15/20 sentences with occasional min A    Baseline may be modified following ENT consult    Time 6    Period Weeks    Status On-going      SLP LONG TERM GOAL #3   Title Pt will follow 4 vocal hygiene strategies over 3 sessions with rare min A    Time 6    Period Weeks    Status On-going      SLP LONG TERM GOAL #4   Title Pt will improve Communicative Effectiveness Survey score by 2 points     Time 6    Period Weeks    Status On-going      SLP LONG TERM GOAL #5   Title pt to use WNL voice quality in  8 minutes simple conversation x3 sessions    Time 7    Period Weeks    Status New      SLP LONG TERM GOAL #6   Title pt to demo PHorTE exercises with rare min A in 3 sessions    Time 7    Period Weeks    Status New            Plan - 11/30/19 1148    Clinical Impression Statement Massiah presents with mild dysathria and moderate dysphonia, 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 SLP educated pt about those diagnoses today as well as trained pt in basics of abdominal breathing and some straw phonation tasks. Skilled ST cont necessary to maximize pt's vocal quality and speech intelligilibty.    Speech Therapy Frequency 2x / week    Duration --   8 weeks or 17 visits   Treatment/Interventions Aspiration precaution training;Environmental controls;Cueing hierarchy;Oral motor exercises;SLP instruction and feedback;Compensatory strategies;Functional tasks;Cognitive reorganization;Compensatory techniques;Diet toleration management by SLP;Internal/external aids;Multimodal communcation approach;Patient/family education    Potential to Achieve Goals Good           Patient will benefit from skilled therapeutic intervention in order to improve the following deficits and impairments:   Dysarthria and anarthria  Other voice and resonance disorders    Problem List Patient Active Problem List   Diagnosis Date Noted  . Abdominal wall hernia 10/30/2019  . Change in voice 10/30/2019  . History of stroke 09/14/2019  . Spinal stenosis 07/24/2019  . Mixed hyperlipidemia   . OSA (obstructive sleep apnea)   . Dysfunction of eustachian tube 05/15/2019  . Other social stressor 03/29/2019  . 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 Diarrhea 06/20/2018  . Joint pain 06/20/2018  . 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 Medicare annual  wellness visit, subsequent 09/15/2017  . SUI (stress urinary incontinence, female) 08/07/2017  . Back pain 06/16/2016  . Advance care planning 01/07/2016  . Sleep apnea   . 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 Allergic rhinitis 11/24/2014  . Adult BMI 30+ 11/24/2014  . Depression 11/24/2014  . Insomnia 08/23/2009  . Headache, migraine 05/10/2009  . HERPES SIMPLEX INFECTION 08/14/2006  . HYPERCHOLESTEROLEMIA 08/14/2006  . Anxiety state 08/14/2006  . Essential hypertension 08/14/2006  . HIATAL HERNIA 08/14/2006  . IRRITABLE BOWEL SYNDROME 08/14/2006  . 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 Primary osteoarthritis of right knee 08/14/2006  . PLANTAR FASCIITIS 08/14/2006    Macala Baldonado, Annye Rusk MS, CCC-SLP 11/30/2019, 11:50 AM  [facility] [address] [place], [place], [address] Phone: [phone]   Fax:  [phone]   Name: MARIALY URBANCZYK MRN: [id number] Date of Birth: [birth year]

## 2019-11-30 NOTE — Therapy (Signed)
[facility] [address] [place], [place], [address] Phone: [phone]   Fax:  [phone]  Physical Therapy Treatment  Patient Details  Name: [name] [name] MRN: [id number] Date of Birth: [birth year] Referring Provider (PT): Referred by Frann Rider, NP, PCP is Elsie Stain, MD    Encounter Date: 11/30/2019   PT End of Session - 11/30/19 1155    Visit Number 7    Number of Visits 13    Date for PT Re-Evaluation 01/31/20   POC for 6 weeks, Cert for 90 days   Authorization Type Medicare    PT Start Time 1147    PT Stop Time 1230    PT Time Calculation (min) 43 min    Equipment Utilized During Treatment Gait belt    Activity Tolerance Patient tolerated treatment well    Behavior During Therapy [facility] for tasks assessed/performed           Past Medical History:  Diagnosis Date  . Arthritis    knees  . Depression   . Fatty liver   . GERD (gastroesophageal reflux disease)   . Hyperlipidemia   . Hypertension   . IBS (irritable bowel syndrome)   . Obesity   . Pneumonia   . Sleep apnea    uses CPAP    Past Surgical History:  Procedure Laterality Date  . BLADDER SURGERY    . BREAST SURGERY     breast biopsy  . BROW LIFT Bilateral 04/14/2017   Procedure: BLEPHAROPLASTY UPPER EYELID WITH EXCESS SKIN;  Surgeon: Karle Starch, MD;  Location: [place];  Service: Ophthalmology;  Laterality: Bilateral;  . CATARACT EXTRACTION W/PHACO Left 02/05/2016   Procedure: CATARACT EXTRACTION PHACO AND INTRAOCULAR LENS PLACEMENT (IOC);  Surgeon: Birder Robson, MD;  Location: [facility];  Service: Ophthalmology;  Laterality: Left;  [place] 01:10 AP% 22.3 CDE 15.67 Fluid pack lot # [id number]  . CATARACT EXTRACTION W/PHACO Right 02/26/2016   Procedure: CATARACT EXTRACTION PHACO AND INTRAOCULAR LENS PLACEMENT (IOC);  Surgeon: Birder Robson, MD;  Location: [facility];  Service: Ophthalmology;  Laterality: Right;  [place] 57.4 AP%  24.0 CDE 13.75 Fluid Pack lot # [id number]  . CHOLECYSTECTOMY    . COLONOSCOPY WITH PROPOFOL N/A 12/18/2014   Procedure: COLONOSCOPY WITH PROPOFOL;  Surgeon: Manya Silvas, MD;  Location: [facility];  Service: Endoscopy;  Laterality: N/A;  . DEEP NECK LYMPH NODE BIOPSY / EXCISION    . JOINT REPLACEMENT    . KNEE ARTHROPLASTY Left 06/20/2015   Procedure: COMPUTER ASSISTED TOTAL KNEE ARTHROPLASTY;  Surgeon: Dereck Leep, MD;  Location: [facility];  Service: Orthopedics;  Laterality: Left;  . PTOSIS REPAIR Bilateral 04/14/2017   Procedure: PTOSIS REPAIR RESECT EX;  Surgeon: Karle Starch, MD;  Location: [place];  Service: Ophthalmology;  Laterality: Bilateral;  sleep apnea  . TONSILLECTOMY    . TOTAL HIP ARTHROPLASTY Right 07/21/2018   Procedure: TOTAL HIP ARTHROPLASTY ANTERIOR APPROACH;  Surgeon: Gaynelle Arabian, MD;  Location: [facility];  Service: Orthopedics;  Laterality: Right;    There were no vitals filed for this visit.   Subjective Assessment - 11/30/19 1151    Subjective Balance foam pad came in today, but was delivered after she left for appt. Patient reports that she will bring it in for the next visit. No pain.    Pertinent History Obesity, hypertension, hyperlipidemia, Lt sciatic nerve pain, sleep apnea on CPAP.    Limitations Walking;Standing;Sitting    Patient Stated Goals  Feel more like myself    Currently in Pain? No/denies                             [facility] Adult PT Treatment/Exercise - 11/30/19 0001      Ambulation/Gait   Ambulation/Gait Yes    Ambulation/Gait Assistance 5: Supervision    Ambulation/Gait Assistance Details throughout therapy session with activities    Assistive device None    Gait Pattern Step-through pattern;Decreased arm swing - right;Decreased arm swing - left;Decreased dorsiflexion - right;Decreased dorsiflexion - left;Poor foot clearance - left;Ataxic    Ambulation Surface Level;Indoor    Gait Comments Patient  requiring intermittent rest breaks with activites due to fatigue and pain in L knee.       High Level Balance   High Level Balance Activities Side stepping;Tandem walking    High Level Balance Comments Completed side stepping and tandem walking on blue mat x 4 reps each. Completed all with CGA and no UE support. PT provided verbal cues for slowed pace.       Neuro Re-ed    Neuro Re-ed Details  Completed obstacle course with gait across blue mat, with alternating step over orange hurdles and cones. With cones, complete tap to cone followed by stepping over to promote SLS. Patient require increased verbal cues for proper stepping over hurdles, as patient demo increased circumduction. Patient require steadying assist for obstacle course due to 1-2 miststeps by patient.                Balance Exercises - 11/30/19 0001      Balance Exercises: Standing   Standing Eyes Opened Narrow base of support (BOS);Head turns;Foam/compliant surface    Standing Eyes Opened Limitations completed 1 x 10 reps of horizontal/vertical head turns.    Standing Eyes Closed Narrow base of support (BOS);Foam/compliant surface;3 reps;30 secs    Tandem Stance Eyes open;Eyes closed;3 reps;30 secs    Tandem Stance Time 15-25 seconds with patient demo increased sway with eyes closed.     Balance Beam Standing on balance beam, focused on eyes closed 3 x 45 secs - 1 min with no UE support. Steadying assist as needed.                PT Short Term Goals - 11/25/19 1244      PT SHORT TERM GOAL #1   Title Patient will be independent with Initial HEP    Time 3    Period Weeks    Status On-going    Target Date 11/23/19      PT SHORT TERM GOAL #2   Title Patient will improve 30 second chair stand test to >/= 12 reps w/ UE support to demo improved BLE strength    Baseline 10 sit <> stands, 12 sit <> stands on 11/25/19    Time 3    Period Weeks    Status Achieved    Target Date 11/23/19      PT SHORT TERM GOAL #3     Title Patient will improve FGA to >/= 20/30 to demonstrate improved balance and reduced risk for falls    Baseline 17/30, 19/30 on 11/25/19    Time 3    Period Weeks    Status Not Met    Target Date 11/23/19      PT SHORT TERM GOAL #4   Title Patient will demonstrate ability to ambulate >/= 500 ft w/o AD to demonstrate  improved community mobility and ability to ambulate within neighborhood    Time 3    Period Weeks    Status Achieved    Target Date 11/23/19             PT Long Term Goals - 11/02/19 1332      PT LONG TERM GOAL #1   Title Pt will be independent with Final HEP    Time 6    Period Weeks    Status New    Target Date 12/14/19      PT LONG TERM GOAL #2   Title Patient will improve gait speed to >/= 3.0 ft/sec to demonstrate improved community mobility    Baseline 2.2 ft/sec    Time 6    Period Weeks    Status New    Target Date 12/14/19      PT LONG TERM GOAL #3   Title Patient will improve FGA to >/= 24/30 to demonstrate reduced risk for falls    Baseline 17/30    Time 6    Period Weeks    Status New    Target Date 12/14/19      PT LONG TERM GOAL #4   Title Patient will demonstrate ability to complete TUG <9 secs w/o AD to demonstrate improved mobility    Baseline 10.97    Time 6    Period Weeks    Status New    Target Date 12/14/19      PT LONG TERM GOAL #5   Title Patient will demonstrate ability to ambulate >/= 600 ft outdoors on unlevel surfaces, Mod I, to demonstrate improved ability to ambulate around neighborhood    Time 6    Period Weeks    Status New    Target Date 12/14/19                 Plan - 11/30/19 1247    Clinical Impression Statement Continued high level balance activties and dynamic gait activites in today's session with intermittent CGA. PT will review at HEP next session as patient is going to bring the foam pad she purchased to trial with HEP. Limited today in session, requiring intermittent rest breaks due to pain  in knees.    Personal Factors and Comorbidities Comorbidity 3+    Comorbidities Obesity, hypertension, hyperlipidemia, Lt sciatic nerve pain, sleep apnea on CPAP    Examination-Activity Limitations Squat;Transfers;Stairs;Stand    Examination-Participation Restrictions Community Activity;Cleaning    Stability/Clinical Decision Making Evolving/Moderate complexity    Rehab Potential Good    PT Frequency 2x / week    PT Duration 6 weeks    PT Treatment/Interventions ADLs/Self Care Home Management;Moist Heat;Cryotherapy;Electrical Stimulation;DME Instruction;Gait training;Stair training;Functional mobility training;Therapeutic activities;Therapeutic exercise;Balance training;Neuromuscular re-education;Patient/family education;Orthotic Fit/Training;Passive range of motion    PT Next Visit Plan review HEP, did patient bring mat? dynamic balance, functional strengthening, corner balance with eyes closed/head motions. Scifit or Nustep?    Consulted and Agree with Plan of Care Patient           Patient will benefit from skilled therapeutic intervention in order to improve the following deficits and impairments:  Abnormal gait, Decreased balance, Decreased endurance, Difficulty walking, Decreased activity tolerance, Decreased safety awareness, Pain, Postural dysfunction, Decreased strength  Visit Diagnosis: Muscle weakness (generalized)  Other abnormalities of gait and mobility  Difficulty in walking, not elsewhere classified  Unsteadiness on feet     Problem List Patient Active Problem List   Diagnosis Date Noted  . Abdominal  wall hernia 10/30/2019  . Change in voice 10/30/2019  . History of stroke 09/14/2019  . Spinal stenosis 07/24/2019  . Mixed hyperlipidemia   . OSA (obstructive sleep apnea)   . Dysfunction of eustachian tube 05/15/2019  . Other social stressor 03/29/2019  . 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 Diarrhea 06/20/2018  . Joint pain 06/20/2018  .  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 Medicare annual wellness visit, subsequent 09/15/2017  . SUI (stress urinary incontinence, female) 08/07/2017  . Back pain 06/16/2016  . Advance care planning 01/07/2016  . Sleep apnea   . 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 Allergic rhinitis 11/24/2014  . Adult BMI 30+ 11/24/2014  . Depression 11/24/2014  . Insomnia 08/23/2009  . Headache, migraine 05/10/2009  . HERPES SIMPLEX INFECTION 08/14/2006  . HYPERCHOLESTEROLEMIA 08/14/2006  . Anxiety state 08/14/2006  . Essential hypertension 08/14/2006  . HIATAL HERNIA 08/14/2006  . IRRITABLE BOWEL SYNDROME 08/14/2006  . 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 Primary osteoarthritis of right knee 08/14/2006  . PLANTAR FASCIITIS 08/14/2006    Jones Bales, PT, DPT 11/30/2019, 12:50 PM  [facility] [address] [place], [place], [address] Phone: [phone]   Fax:  [phone]  Name: LATEASHA BREUER MRN: [id number] Date of Birth: 1941-05-11

## 2019-11-30 NOTE — Patient Instructions (Signed)
   Keep up practicing abdominal breath throughout the day - during commercial, while driving  When you are talking - pay attention to any strain in your throat. If you notice a gravelly voice or strain,take a belly breath to power your voice   Semi-occluded vocal tract exercises (SOVTE)  These allow your vocal folds to vibrate without excess tension and promotes high placement of the voice  Use SOVTE as a warm up before prolonged speaking and vocal exercises   Do each for 10 breaths  Gargle  Bubbles with voice/hum   Siren voice  National anthem  Then use belly breath for short sentences with volume like you are talking to your neighbor over the fence  10 sentences 2x a day - no strain, allow the air to flow out of your mouth  A goal would be 2-3 minutes several times a day and prior to vocal exercises  As always, use good belly breathing while completing SOVTE  Do this to warm up your voice before talking on the phone

## 2019-12-02 ENCOUNTER — Other Ambulatory Visit: Payer: Self-pay

## 2019-12-02 ENCOUNTER — Ambulatory Visit: Payer: Medicare HMO

## 2019-12-02 DIAGNOSIS — R2681 Unsteadiness on feet: Secondary | ICD-10-CM | POA: Diagnosis not present

## 2019-12-02 DIAGNOSIS — R262 Difficulty in walking, not elsewhere classified: Secondary | ICD-10-CM | POA: Diagnosis not present

## 2019-12-02 DIAGNOSIS — R2689 Other abnormalities of gait and mobility: Secondary | ICD-10-CM | POA: Diagnosis not present

## 2019-12-02 DIAGNOSIS — R498 Other voice and resonance disorders: Secondary | ICD-10-CM | POA: Diagnosis not present

## 2019-12-02 DIAGNOSIS — R471 Dysarthria and anarthria: Secondary | ICD-10-CM | POA: Diagnosis not present

## 2019-12-02 DIAGNOSIS — M6281 Muscle weakness (generalized): Secondary | ICD-10-CM | POA: Diagnosis not present

## 2019-12-02 DIAGNOSIS — R278 Other lack of coordination: Secondary | ICD-10-CM | POA: Diagnosis not present

## 2019-12-02 NOTE — Therapy (Signed)
[facility] [address] [place], [place], [address] Phone: [phone]   Fax:  [phone]  Physical Therapy Treatment  Patient Details  Name: [name] [name] MRN: [id number] Date of Birth: [birth year] Referring Provider (PT): Referred by Frann Rider, NP, PCP is Elsie Stain, MD    Encounter Date: 12/02/2019   PT End of Session - 12/02/19 1243    Visit Number 8    Number of Visits 13    Date for PT Re-Evaluation 01/31/20   POC for 6 weeks, Cert for 90 days   Authorization Type Medicare    PT Start Time 1102    PT Stop Time 1144    PT Time Calculation (min) 42 min    Equipment Utilized During Treatment Gait belt    Activity Tolerance Patient tolerated treatment well    Behavior During Therapy [facility] for tasks assessed/performed           Past Medical History:  Diagnosis Date  . Arthritis    knees  . Depression   . Fatty liver   . GERD (gastroesophageal reflux disease)   . Hyperlipidemia   . Hypertension   . IBS (irritable bowel syndrome)   . Obesity   . Pneumonia   . Sleep apnea    uses CPAP    Past Surgical History:  Procedure Laterality Date  . BLADDER SURGERY    . BREAST SURGERY     breast biopsy  . BROW LIFT Bilateral 04/14/2017   Procedure: BLEPHAROPLASTY UPPER EYELID WITH EXCESS SKIN;  Surgeon: Karle Starch, MD;  Location: [place];  Service: Ophthalmology;  Laterality: Bilateral;  . CATARACT EXTRACTION W/PHACO Left 02/05/2016   Procedure: CATARACT EXTRACTION PHACO AND INTRAOCULAR LENS PLACEMENT (IOC);  Surgeon: Birder Robson, MD;  Location: [facility];  Service: Ophthalmology;  Laterality: Left;  [place] 01:10 AP% 22.3 CDE 15.67 Fluid pack lot # [id number]  . CATARACT EXTRACTION W/PHACO Right 02/26/2016   Procedure: CATARACT EXTRACTION PHACO AND INTRAOCULAR LENS PLACEMENT (IOC);  Surgeon: Birder Robson, MD;  Location: [facility];  Service: Ophthalmology;  Laterality: Right;  [place] 57.4 AP%  24.0 CDE 13.75 Fluid Pack lot # [id number]  . CHOLECYSTECTOMY    . COLONOSCOPY WITH PROPOFOL N/A 12/18/2014   Procedure: COLONOSCOPY WITH PROPOFOL;  Surgeon: Manya Silvas, MD;  [facility];  Service: Endoscopy;  Laterality: N/A;  . DEEP NECK LYMPH NODE BIOPSY / EXCISION    . JOINT REPLACEMENT    . KNEE ARTHROPLASTY Left 06/20/2015   Procedure: COMPUTER ASSISTED TOTAL KNEE ARTHROPLASTY;  Surgeon: Dereck Leep, MD;  Location: [facility];  Service: Orthopedics;  Laterality: Left;  . PTOSIS REPAIR Bilateral 04/14/2017   Procedure: PTOSIS REPAIR RESECT EX;  Surgeon: Karle Starch, MD;  Location: [place];  Service: Ophthalmology;  Laterality: Bilateral;  sleep apnea  . TONSILLECTOMY    . TOTAL HIP ARTHROPLASTY Right 07/21/2018   Procedure: TOTAL HIP ARTHROPLASTY ANTERIOR APPROACH;  Surgeon: Gaynelle Arabian, MD;  Location: [facility];  Service: Orthopedics;  Laterality: Right;    There were no vitals filed for this visit.   Subjective Assessment - 12/02/19 1106    Subjective Patient reports that she has been busy recently. Reports that she is officially off the study. Patient reports that feels good this morning, no pain.    Pertinent History Obesity, hypertension, hyperlipidemia, Lt sciatic nerve pain, sleep apnea on CPAP.    Limitations Walking;Standing;Sitting    Patient Stated Goals Feel more like  myself    Currently in Pain? No/denies                             [facility] Adult PT Treatment/Exercise - 12/02/19 0001      Ambulation/Gait   Ambulation/Gait Yes    Ambulation/Gait Assistance 5: Supervision    Ambulation/Gait Assistance Details throughout therapy session for activities    Assistive device None    Gait Pattern Step-through pattern;Decreased arm swing - right;Decreased arm swing - left;Decreased dorsiflexion - right;Decreased dorsiflexion - left;Poor foot clearance - left;Ataxic    Ambulation Surface Level;Indoor      Neuro Re-ed    Neuro  Re-ed Details  Completed review of standing balance activities included in patient's HEP to ensure proper completion, and trialed patient's personal balance pad to ensure safety with completion. Completed the following: romberg with eyes closed 3 x 30 secs, romberg with horizontal/vertical heads 2 x 10 reps of each, patient able to complete with eyes closed and maintain balance. Completed modified tandem stance on the foam pad, with patient able tto complete 3 x 30 seconds. Updated HEP to include horizontal/vertical head turns with Eyes closed, and progress tandem to complaint surfaces.                Balance Exercises - 12/02/19 0001      Balance Exercises: Standing   Rockerboard Anterior/posterior;Lateral;EO;EC;Limitations    Rockerboard Limitations standing on rockerboard, focusedon holding steadying. Completed ant/post, 30 secs x 3 with eyes closed. Positioned laterally, completed 3 x 30 secs with EO, attempted with EC but patient unable to maintain balance without CGA from PT or UE support.     Balance Beam Standing on balance beam, completed with feet together and eyes closed 3 x 1 min.     Other Standing Exercises Standing on rockerboard (positioned ant/post) completed alternating toe taps to 4" step. patient demo increased difficulty with stepping with LLE to step, requiring intermittent CGA for steadying.              PT Education - 12/02/19 1245    Education Details educated on HEP update, proper use of balance pad for home    Person(s) Educated Patient    Methods Explanation;Demonstration;Handout    Comprehension Verbalized understanding;Returned demonstration            PT Short Term Goals - 11/25/19 1244      PT SHORT TERM GOAL #1   Title Patient will be independent with Initial HEP    Time 3    Period Weeks    Status On-going    Target Date 11/23/19      PT SHORT TERM GOAL #2   Title Patient will improve 30 second chair stand test to >/= 12 reps w/ UE support  to demo improved BLE strength    Baseline 10 sit <> stands, 12 sit <> stands on 11/25/19    Time 3    Period Weeks    Status Achieved    Target Date 11/23/19      PT SHORT TERM GOAL #3   Title Patient will improve FGA to >/= 20/30 to demonstrate improved balance and reduced risk for falls    Baseline 17/30, 19/30 on 11/25/19    Time 3    Period Weeks    Status Not Met    Target Date 11/23/19      PT SHORT TERM GOAL #4   Title Patient will demonstrate ability  to ambulate >/= 500 ft w/o AD to demonstrate improved community mobility and ability to ambulate within neighborhood    Time 3    Period Weeks    Status Achieved    Target Date 11/23/19             PT Long Term Goals - 11/02/19 1332      PT LONG TERM GOAL #1   Title Pt will be independent with Final HEP    Time 6    Period Weeks    Status New    Target Date 12/14/19      PT LONG TERM GOAL #2   Title Patient will improve gait speed to >/= 3.0 ft/sec to demonstrate improved community mobility    Baseline 2.2 ft/sec    Time 6    Period Weeks    Status New    Target Date 12/14/19      PT LONG TERM GOAL #3   Title Patient will improve FGA to >/= 24/30 to demonstrate reduced risk for falls    Baseline 17/30    Time 6    Period Weeks    Status New    Target Date 12/14/19      PT LONG TERM GOAL #4   Title Patient will demonstrate ability to complete TUG <9 secs w/o AD to demonstrate improved mobility    Baseline 10.97    Time 6    Period Weeks    Status New    Target Date 12/14/19      PT LONG TERM GOAL #5   Title Patient will demonstrate ability to ambulate >/= 600 ft outdoors on unlevel surfaces, Mod I, to demonstrate improved ability to ambulate around neighborhood    Time 6    Period Weeks    Status New    Target Date 12/14/19                 Plan - 12/02/19 1253    Clinical Impression Statement Today's session included review of static balance exercises HEP with patient's personal balance pad.  Patient able to demonstrate all appropriately and safely for completion at home, HEP updated to progress balance actvities (see patient instructions for details). Patient will continue to benefit from skilled PT services to progress toward all goals.    Personal Factors and Comorbidities Comorbidity 3+    Comorbidities Obesity, hypertension, hyperlipidemia, Lt sciatic nerve pain, sleep apnea on CPAP    Examination-Activity Limitations Squat;Transfers;Stairs;Stand    Examination-Participation Restrictions Community Activity;Cleaning    Stability/Clinical Decision Making Evolving/Moderate complexity    Rehab Potential Good    PT Frequency 2x / week    PT Duration 6 weeks    PT Treatment/Interventions ADLs/Self Care Home Management;Moist Heat;Cryotherapy;Electrical Stimulation;DME Instruction;Gait training;Stair training;Functional mobility training;Therapeutic activities;Therapeutic exercise;Balance training;Neuromuscular re-education;Patient/family education;Orthotic Fit/Training;Passive range of motion    PT Next Visit Plan did get to try balanc exercises with mat? dynamic balance, functional strengthening, corner balance with eyes closed/head motions. Scifit or Nustep?    Consulted and Agree with Plan of Care Patient           Patient will benefit from skilled therapeutic intervention in order to improve the following deficits and impairments:  Abnormal gait, Decreased balance, Decreased endurance, Difficulty walking, Decreased activity tolerance, Decreased safety awareness, Pain, Postural dysfunction, Decreased strength  Visit Diagnosis: Difficulty in walking, not elsewhere classified  Unsteadiness on feet  Other abnormalities of gait and mobility  Muscle weakness (generalized)     Problem List  Patient Active Problem List   Diagnosis Date Noted  . Abdominal wall hernia 10/30/2019  . Change in voice 10/30/2019  . History of stroke 09/14/2019  . Spinal stenosis 07/24/2019  .  Mixed hyperlipidemia   . OSA (obstructive sleep apnea)   . Dysfunction of eustachian tube 05/15/2019  . Other social stressor 03/29/2019  . 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 Diarrhea 06/20/2018  . Joint pain 06/20/2018  . 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 Medicare annual wellness visit, subsequent 09/15/2017  . SUI (stress urinary incontinence, female) 08/07/2017  . Back pain 06/16/2016  . Advance care planning 01/07/2016  . Sleep apnea   . 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 Allergic rhinitis 11/24/2014  . Adult BMI 30+ 11/24/2014  . Depression 11/24/2014  . Insomnia 08/23/2009  . Headache, migraine 05/10/2009  . HERPES SIMPLEX INFECTION 08/14/2006  . HYPERCHOLESTEROLEMIA 08/14/2006  . Anxiety state 08/14/2006  . Essential hypertension 08/14/2006  . HIATAL HERNIA 08/14/2006  . IRRITABLE BOWEL SYNDROME 08/14/2006  . 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 Primary osteoarthritis of right knee 08/14/2006  . PLANTAR FASCIITIS 08/14/2006    Jones Bales, PT, DPT 12/02/2019, 12:58 PM  [facility] [address] [place], [place], [address] Phone: [phone]   Fax:  [phone]  Name: KEAGHAN BOWENS MRN: [id number] Date of Birth: 1940-11-26

## 2019-12-02 NOTE — Patient Instructions (Signed)
Access Code: [email or web address] URL: [email or web address] Date: 12/02/2019 Prepared by: Baldomero Lamy  Exercises Tandem Walking with Counter Support - 2 x daily - 5 x weekly - 3 sets Standing Marching - 2 x daily - 5 x weekly - 3 sets Backward Walking with Counter Support - 2 x daily - 5 x weekly - 3 sets Romberg Stance Eyes Closed on Foam Pad - 1 x daily - 5 x weekly - 1 sets - 3 reps - 30 hold Romberg Stance on Foam Pad with Head Rotation - 1 x daily - 5 x weekly - 2 sets - 10 reps Romberg Stance with Head Nods on Foam Pad - 1 x daily - 7 x weekly - 3 sets - 10 reps Tandem Stance on Foam Pad with Eyes Open - 1 x daily - 5 x weekly - 1 sets - 3 reps - 30 hold Scapular Retraction with Resistance - 1 x daily - 7 x weekly - 2 sets - 10 reps Seated Cervical Retraction - 1 x daily - 7 x weekly - 2 sets - 10 reps

## 2019-12-07 ENCOUNTER — Encounter: Payer: Self-pay | Admitting: Speech Pathology

## 2019-12-07 ENCOUNTER — Other Ambulatory Visit: Payer: Self-pay

## 2019-12-07 ENCOUNTER — Ambulatory Visit: Payer: Medicare HMO | Admitting: Occupational Therapy

## 2019-12-07 ENCOUNTER — Ambulatory Visit: Payer: Medicare HMO

## 2019-12-07 ENCOUNTER — Encounter: Payer: Self-pay | Admitting: Occupational Therapy

## 2019-12-07 ENCOUNTER — Ambulatory Visit: Payer: Medicare HMO | Admitting: Speech Pathology

## 2019-12-07 DIAGNOSIS — R2681 Unsteadiness on feet: Secondary | ICD-10-CM

## 2019-12-07 DIAGNOSIS — R2689 Other abnormalities of gait and mobility: Secondary | ICD-10-CM | POA: Diagnosis not present

## 2019-12-07 DIAGNOSIS — R262 Difficulty in walking, not elsewhere classified: Secondary | ICD-10-CM | POA: Diagnosis not present

## 2019-12-07 DIAGNOSIS — M6281 Muscle weakness (generalized): Secondary | ICD-10-CM | POA: Diagnosis not present

## 2019-12-07 DIAGNOSIS — R471 Dysarthria and anarthria: Secondary | ICD-10-CM | POA: Diagnosis not present

## 2019-12-07 DIAGNOSIS — R278 Other lack of coordination: Secondary | ICD-10-CM

## 2019-12-07 DIAGNOSIS — R498 Other voice and resonance disorders: Secondary | ICD-10-CM | POA: Diagnosis not present

## 2019-12-07 NOTE — Patient Instructions (Signed)
   When you hear hoarseness, take a a breath and give your voice some power  Read aloud 5 minutes or so from book or magazine focusing on you breathing and strong voice (volume)  Keep the gargle and straw exercises going twice a day and as a warm up if you know you will be talking (ie: before you practice with your daughter)  Let your daughter know that you may be a little louder and may breathe more frequently - see if she can hear when your voice is clear or hoarse  Keep up the good work not clearing your throat

## 2019-12-07 NOTE — Therapy (Signed)
[facility] [address] [place], [place], [address] Phone: [phone]   Fax:  [phone]  Physical Therapy Treatment  Patient Details  Name: [name] [name] MRN: [id number] Date of Birth: 04-08-1941 Referring Provider (PT): Referred by Frann Rider, NP, PCP is Elsie Stain, MD    Encounter Date: 12/07/2019   PT End of Session - 12/07/19 1109    Visit Number 9    Number of Visits 13    Date for PT Re-Evaluation 01/31/20   POC for 6 weeks, Cert for 90 days   Authorization Type Medicare    PT Start Time 1100    PT Stop Time 1144    PT Time Calculation (min) 44 min    Equipment Utilized During Treatment Gait belt    Activity Tolerance Patient tolerated treatment well    Behavior During Therapy [facility] for tasks assessed/performed           Past Medical History:  Diagnosis Date  . Arthritis    knees  . Depression   . Fatty liver   . GERD (gastroesophageal reflux disease)   . Hyperlipidemia   . Hypertension   . IBS (irritable bowel syndrome)   . Obesity   . Pneumonia   . Sleep apnea    uses CPAP    Past Surgical History:  Procedure Laterality Date  . BLADDER SURGERY    . BREAST SURGERY     breast biopsy  . BROW LIFT Bilateral 04/14/2017   Procedure: BLEPHAROPLASTY UPPER EYELID WITH EXCESS SKIN;  Surgeon: Karle Starch, MD;  Location: [place];  Service: Ophthalmology;  Laterality: Bilateral;  . CATARACT EXTRACTION W/PHACO Left 02/05/2016   Procedure: CATARACT EXTRACTION PHACO AND INTRAOCULAR LENS PLACEMENT (IOC);  Surgeon: Birder Robson, MD;  Location: [facility];  Service: Ophthalmology;  Laterality: Left;  [place] 01:10 AP% 22.3 CDE 15.67 Fluid pack lot # [id number]  . CATARACT EXTRACTION W/PHACO Right 02/26/2016   Procedure: CATARACT EXTRACTION PHACO AND INTRAOCULAR LENS PLACEMENT (IOC);  Surgeon: Birder Robson, MD;  Location: [facility];  Service: Ophthalmology;  Laterality: Right;  [place] 57.4 AP%  24.0 CDE 13.75 Fluid Pack lot # [id number]  . CHOLECYSTECTOMY    . COLONOSCOPY WITH PROPOFOL N/A 12/18/2014   Procedure: COLONOSCOPY WITH PROPOFOL;  Surgeon: Manya Silvas, MD;  Location: [facility];  Service: Endoscopy;  Laterality: N/A;  . DEEP NECK LYMPH NODE BIOPSY / EXCISION    . JOINT REPLACEMENT    . KNEE ARTHROPLASTY Left 06/20/2015   Procedure: COMPUTER ASSISTED TOTAL KNEE ARTHROPLASTY;  Surgeon: Dereck Leep, MD;  Location: [facility];  Service: Orthopedics;  Laterality: Left;  . PTOSIS REPAIR Bilateral 04/14/2017   Procedure: PTOSIS REPAIR RESECT EX;  Surgeon: Karle Starch, MD;  Location: [place];  Service: Ophthalmology;  Laterality: Bilateral;  sleep apnea  . TONSILLECTOMY    . TOTAL HIP ARTHROPLASTY Right 07/21/2018   Procedure: TOTAL HIP ARTHROPLASTY ANTERIOR APPROACH;  Surgeon: Gaynelle Arabian, MD;  Location: [facility];  Service: Orthopedics;  Laterality: Right;    There were no vitals filed for this visit.   Subjective Assessment - 12/07/19 1107    Subjective Patient reports she completed HEP with her personal balance pad, overall went well.    Pertinent History Obesity, hypertension, hyperlipidemia, Lt sciatic nerve pain, sleep apnea on CPAP.    Limitations Walking;Standing;Sitting    Patient Stated Goals Feel more like myself    Currently in Pain? No/denies  [facility] Adult PT Treatment/Exercise - 12/07/19 1141      Ambulation/Gait   Ambulation/Gait Yes    Ambulation/Gait Assistance 5: Supervision    Assistive device None    Gait Pattern Step-through pattern;Decreased arm swing - right;Decreased arm swing - left;Decreased dorsiflexion - right;Decreased dorsiflexion - left;Poor foot clearance - left;Ataxic    Ambulation Surface Level;Indoor    Gait Comments completed horizontal/vertical head turns 1 x 115 ft each. Pt demo decreased gait speed and imbalance with horizontal head turns. CGA as needed.                 Balance Exercises - 12/07/19 0001      Balance Exercises: Standing   Standing Eyes Closed Narrow base of support (BOS);Foam/compliant surface;3 reps;30 secs    Tandem Stance Eyes open;Foam/compliant surface;3 reps;Intermittent upper extremity support;30 secs;Eyes closed    Tandem Stance Time 20-30 seconds, completed intiially with EO patient able to hold for full 30 seconds today with EO. With progression to [facility], patient demo decreased stability and unable to maintain <15 seconds prior to needing CGA or intermittent UE support from // bars    Balance Beam Completed static standing on balance beam with EC, 3 x 30 seconds. Completed horizontal/vertical head turns 1 x 10 reps with EO. Patient demo good stability with EO, so progressed to [facility], completed 2 x 10 reps of horizontal/vertical with eyes closed. Increased difficulty with EC, patient requiring intermitent UE support and CGA for completion    Tandem Gait Forward;Retro;Foam/compliant surface;3 reps;Limitations    Tandem Gait Limitations completed forward/retro tandem walking x 3 laps in // bars. Completed initial rep with UE support, progressed to no UE support. Patient demo increased difficulty with retro gait and requiring CGA throughout for completion. Highly reliant on vision with completion    Sidestepping Foam/compliant support;3 reps    Sidestepping Limitations completed side steeping along blue foam beam, no UE support, x 3 reps    Other Standing Exercises Comments PT educating with completion of all head turns, to complete in pain free range due to patient having chronic cervical neck pain.                PT Short Term Goals - 11/25/19 1244      PT SHORT TERM GOAL #1   Title Patient will be independent with Initial HEP    Time 3    Period Weeks    Status On-going    Target Date 11/23/19      PT SHORT TERM GOAL #2   Title Patient will improve 30 second chair stand test to >/= 12 reps w/ UE support to demo improved  BLE strength    Baseline 10 sit <> stands, 12 sit <> stands on 11/25/19    Time 3    Period Weeks    Status Achieved    Target Date 11/23/19      PT SHORT TERM GOAL #3   Title Patient will improve FGA to >/= 20/30 to demonstrate improved balance and reduced risk for falls    Baseline 17/30, 19/30 on 11/25/19    Time 3    Period Weeks    Status Not Met    Target Date 11/23/19      PT SHORT TERM GOAL #4   Title Patient will demonstrate ability to ambulate >/= 500 ft w/o AD to demonstrate improved community mobility and ability to ambulate within neighborhood    Time 3    Period Weeks  Status Achieved    Target Date 11/23/19             PT Long Term Goals - 11/02/19 1332      PT LONG TERM GOAL #1   Title Pt will be independent with Final HEP    Time 6    Period Weeks    Status New    Target Date 12/14/19      PT LONG TERM GOAL #2   Title Patient will improve gait speed to >/= 3.0 ft/sec to demonstrate improved community mobility    Baseline 2.2 ft/sec    Time 6    Period Weeks    Status New    Target Date 12/14/19      PT LONG TERM GOAL #3   Title Patient will improve FGA to >/= 24/30 to demonstrate reduced risk for falls    Baseline 17/30    Time 6    Period Weeks    Status New    Target Date 12/14/19      PT LONG TERM GOAL #4   Title Patient will demonstrate ability to complete TUG <9 secs w/o AD to demonstrate improved mobility    Baseline 10.97    Time 6    Period Weeks    Status New    Target Date 12/14/19      PT LONG TERM GOAL #5   Title Patient will demonstrate ability to ambulate >/= 600 ft outdoors on unlevel surfaces, Mod I, to demonstrate improved ability to ambulate around neighborhood    Time 6    Period Weeks    Status New    Target Date 12/14/19                 Plan - 12/07/19 1157    Clinical Impression Statement Today's skilled PT session focused on continued balance exercises, progressing high level balance on complaint  surfaces and vision removed. Patient demo most difficulty with retro tandem gait in today's session. With horizontal head turns with gait patient demo increased unsteadiness and decreased gait speed due to imbalance. Patient will continue to benefit from skilled PT services to progress toward all goals.    Personal Factors and Comorbidities Comorbidity 3+    Comorbidities Obesity, hypertension, hyperlipidemia, Lt sciatic nerve pain, sleep apnea on CPAP    Examination-Activity Limitations Squat;Transfers;Stairs;Stand    Examination-Participation Restrictions Community Activity;Cleaning    Stability/Clinical Decision Making Evolving/Moderate complexity    Rehab Potential Good    PT Frequency 2x / week    PT Duration 6 weeks    PT Treatment/Interventions ADLs/Self Care Home Management;Moist Heat;Cryotherapy;Electrical Stimulation;DME Instruction;Gait training;Stair training;Functional mobility training;Therapeutic activities;Therapeutic exercise;Balance training;Neuromuscular re-education;Patient/family education;Orthotic Fit/Training;Passive range of motion    PT Next Visit Plan Continue dynamic balance, functional strengthening, corner balance with eyes closed/head motions. Scifit or Nustep?    Consulted and Agree with Plan of Care Patient           Patient will benefit from skilled therapeutic intervention in order to improve the following deficits and impairments:  Abnormal gait, Decreased balance, Decreased endurance, Difficulty walking, Decreased activity tolerance, Decreased safety awareness, Pain, Postural dysfunction, Decreased strength  Visit Diagnosis: Difficulty in walking, not elsewhere classified  Unsteadiness on feet  Other abnormalities of gait and mobility  Muscle weakness (generalized)     Problem List Patient Active Problem List   Diagnosis Date Noted  . Abdominal wall hernia 10/30/2019  . Change in voice 10/30/2019  . History of stroke 09/14/2019  .  Spinal  stenosis 07/24/2019  . Mixed hyperlipidemia   . OSA (obstructive sleep apnea)   . Dysfunction of eustachian tube 05/15/2019  . Other social stressor 03/29/2019  . 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 Diarrhea 06/20/2018  . Joint pain 06/20/2018  . 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 Medicare annual wellness visit, subsequent 09/15/2017  . SUI (stress urinary incontinence, female) 08/07/2017  . Back pain 06/16/2016  . Advance care planning 01/07/2016  . Sleep apnea   . 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 Allergic rhinitis 11/24/2014  . Adult BMI 30+ 11/24/2014  . Depression 11/24/2014  . Insomnia 08/23/2009  . Headache, migraine 05/10/2009  . HERPES SIMPLEX INFECTION 08/14/2006  . HYPERCHOLESTEROLEMIA 08/14/2006  . Anxiety state 08/14/2006  . Essential hypertension 08/14/2006  . HIATAL HERNIA 08/14/2006  . IRRITABLE BOWEL SYNDROME 08/14/2006  . 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 Primary osteoarthritis of right knee 08/14/2006  . PLANTAR FASCIITIS 08/14/2006    Jones Bales, PT, DPT 12/07/2019, 12:02 PM  [facility] [address] [place], [place], [address] Phone: [phone]   Fax:  [phone]  Name: [name] [name] MRN: [id number] Date of Birth: 1941/01/31

## 2019-12-07 NOTE — Therapy (Signed)
[facility] [address] [place], [place], [address] Phone: [phone]   Fax:  [phone]  Occupational Therapy Evaluation  Patient Details  Name: [name] [name] MRN: [id number] Date of Birth: 05-Oct-1940 Referring Provider (OT): Frann Rider, NP   Encounter Date: 12/07/2019   OT End of Session - 12/07/19 1215    Visit Number 1    Number of Visits 9    Date for OT Re-Evaluation 01/06/20    Authorization Type [employer]    OT Start Time 1020    OT Stop Time 1050    OT Time Calculation (min) 30 min           Past Medical History:  Diagnosis Date  . Arthritis    knees  . Depression   . Fatty liver   . GERD (gastroesophageal reflux disease)   . Hyperlipidemia   . Hypertension   . IBS (irritable bowel syndrome)   . Obesity   . Pneumonia   . Sleep apnea    uses CPAP    Past Surgical History:  Procedure Laterality Date  . BLADDER SURGERY    . BREAST SURGERY     breast biopsy  . BROW LIFT Bilateral 04/14/2017   Procedure: BLEPHAROPLASTY UPPER EYELID WITH EXCESS SKIN;  Surgeon: Karle Starch, MD;  Location: [place];  Service: Ophthalmology;  Laterality: Bilateral;  . CATARACT EXTRACTION W/PHACO Left 02/05/2016   Procedure: CATARACT EXTRACTION PHACO AND INTRAOCULAR LENS PLACEMENT (IOC);  Surgeon: Birder Robson, MD;  Location: [facility];  Service: Ophthalmology;  Laterality: Left;  [place] 01:10 AP% 22.3 CDE 15.67 Fluid pack lot # [id number]  . CATARACT EXTRACTION W/PHACO Right 02/26/2016   Procedure: CATARACT EXTRACTION PHACO AND INTRAOCULAR LENS PLACEMENT (IOC);  Surgeon: Birder Robson, MD;  Location: [facility];  Service: Ophthalmology;  Laterality: Right;  [place] 57.4 AP% 24.0 CDE 13.75 Fluid Pack lot # [id number]  . CHOLECYSTECTOMY    . COLONOSCOPY WITH PROPOFOL N/A 12/18/2014   Procedure: COLONOSCOPY WITH PROPOFOL;  Surgeon: Manya Silvas, MD;  Location: [facility];  Service: Endoscopy;   Laterality: N/A;  . DEEP NECK LYMPH NODE BIOPSY / EXCISION    . JOINT REPLACEMENT    . KNEE ARTHROPLASTY Left 06/20/2015   Procedure: COMPUTER ASSISTED TOTAL KNEE ARTHROPLASTY;  Surgeon: Dereck Leep, MD;  Location: [facility];  Service: Orthopedics;  Laterality: Left;  . PTOSIS REPAIR Bilateral 04/14/2017   Procedure: PTOSIS REPAIR RESECT EX;  Surgeon: Karle Starch, MD;  Location: [place];  Service: Ophthalmology;  Laterality: Bilateral;  sleep apnea  . TONSILLECTOMY    . TOTAL HIP ARTHROPLASTY Right 07/21/2018   Procedure: TOTAL HIP ARTHROPLASTY ANTERIOR APPROACH;  Surgeon: Gaynelle Arabian, MD;  Location: [facility];  Service: Orthopedics;  Laterality: Right;    There were no vitals filed for this visit.   Subjective Assessment - 12/07/19 1219    Subjective  Pt reports dropping things out of left hand    Pertinent History Patient is a 79 y.o. female referred to Neuro OPOT due to CVA that occurred in 06/2019. Patient PMH is significant for: Obesity, hypertension, hyperlipidemia, Lt sciatic nerve pain, and sleep apnea on CPAP.    Patient Stated Goals improve coordination    Currently in Pain? Yes    Pain Score 2     Pain Location Hand    Pain Orientation Right   CMC joint   Pain Descriptors / Indicators Aching    Pain Type Chronic  pain    Pain Onset More than a month ago    Pain Frequency Intermittent    Aggravating Factors  overuse    Pain Relieving Factors rest             [facility] OT Assessment - 12/07/19 0001      Assessment   Medical Diagnosis CVA    Referring Provider (OT) Frann Rider, NP    Onset Date/Surgical Date 07/11/19   date of CVA   Hand Dominance Right    Prior Therapy Outpatient therapy for LBP      Precautions   Precautions Fall      Balance Screen   Has the patient fallen in the past 6 months Yes    How many times? 2x    Has the patient had a decrease in activity level because of a fear of falling?  Yes    Is the patient reluctant to leave their  home because of a fear of falling?  No      Home  Environment   Family/patient expects to be discharged to: Private residence    Living Arrangements --   [place] level    Lives With Alone      Prior Function   Level of [place] Retired    Catering manager, [name], Traveling      ADL   ADL comments modified I with all basic ADLs, pt reports frequent drops particularly out of LUE      IADL   Shopping Takes care of all shopping needs independently    Meal Prep Able to complete simple cold meal and snack prep      Mobility   Mobility Status Independent      Written Expression   Dominant Hand Right      Vision - History   Visual History Cataracts      Vision Assessment   Vision Assessment Vision not tested      Cognition   Overall Cognitive Status Within Functional Limits for tasks assessed      Posture/Postural Control   Posture/Postural Control Postural limitations    Postural Limitations Rounded Shoulders;Forward head      Sensation   Light Touch Appears Intact      Coordination   Fine Motor Movements are Fluid and Coordinated No    9 Hole Peg Test Right;Left    Right 9 Hole Peg Test 21.59    Left 9 Hole Peg Test 40.87    Coordination impaired for LUE      AROM   Overall AROM  Within functional limits for tasks performed    Overall AROM Comments --      Strength   Overall Strength Deficits    Overall Strength Comments RUE WFLS, LUE proximal 4/5, distal 4+/5    Right Hip Flexion --    Right Hip ABduction --    Right Hip ADduction --    Left Hip Flexion --    Left Hip ABduction --    Left Hip ADduction --    Right Knee Flexion --    Right Knee Extension --    Left Knee Flexion --    Left Knee Extension --    Right Ankle Dorsiflexion --    Left Ankle Dorsiflexion --      Hand Function   Right Hand Grip (lbs) 47.6    Left Hand Grip (lbs) 46.5  OT Long Term Goals - 12/07/19 1210      OT LONG TERM GOAL #1   Title I with HEP for coordination and UE strength    Time 4    Period Weeks    Status New    Target Date 01/06/20      OT LONG TERM GOAL #2   Title Pt will demonstrate improved fine motor coordination  for ADLs as evidenced by decreasing 9 hole peg test score by 5 secs.    Time 4    Period Weeks    Status New      OT LONG TERM GOAL #3   Title Pt will demonstrate ability to carry a plate in 1 hand and a beverage in the other hand 125 ' without drops or spills.    Time 4    Period Weeks    Status New      OT LONG TERM GOAL #4   Title Pt will report that she is dropping items less during ADLs/IADLs.    Time 4    Period Weeks    Status New                 Plan - 12/07/19 1206    Clinical Impression Statement Patient is a 79 y.o. female referred to Neuro OPOT due to CVA that occurred in 06/2019. Patient PMH is significant for: Obesity, hypertension, hyperlipidemia, Lt sciatic nerve pain, and sleep apnea on CPAP. Pt presents with the following deficits: decreased strength, decreased coordination, decreased balance which impedes performance of ADLs/IADLs. Pt can benefit from skilled occupational therapy to address these deficits in order to maximize pt's safety and indepdnence with daily activities.    OT Occupational Profile and History Problem Focused Assessment - Including review of records relating to presenting problem    Occupational performance deficits (Please refer to evaluation for details): ADL's;IADL's;Leisure;Play;Social Participation    Body Structure / Function / Physical Skills ADL;Balance;Endurance;Strength;UE functional use;FMC;Gait;Coordination;ROM;GMC;Decreased knowledge of use of DME;Dexterity;IADL    Rehab Potential Good    Clinical Decision Making Limited treatment options, no task modification necessary    Comorbidities Affecting Occupational Performance: May have comorbidities impacting  occupational performance    Modification or Assistance to Complete Evaluation  No modification of tasks or assist necessary to complete eval    OT Frequency 1x / week    OT Duration 4 weeks   4 weeks plus eval   OT Treatment/Interventions Self-care/ADL training;Therapeutic exercise;Balance training;Manual Therapy;Neuromuscular education;Ultrasound;Therapeutic activities;DME and/or AE instruction;Paraffin;Cryotherapy;Fluidtherapy;Patient/family education;Passive range of motion;Moist Heat;Contrast Bath    Plan coordination HEP, then HEP for overall UE strength and endurance    Consulted and Agree with Plan of Care Patient           Patient will benefit from skilled therapeutic intervention in order to improve the following deficits and impairments:   Body Structure / Function / Physical Skills: ADL, Balance, Endurance, Strength, UE functional use, FMC, Gait, Coordination, ROM, GMC, Decreased knowledge of use of DME, Dexterity, IADL       Visit Diagnosis: Muscle weakness (generalized) - Plan: Ot plan of care cert/re-cert  Other lack of coordination - Plan: Ot plan of care cert/re-cert  Unsteadiness on feet - Plan: Ot plan of care cert/re-cert  Other abnormalities of gait and mobility - Plan: Ot plan of care cert/re-cert    Problem List Patient Active Problem List   Diagnosis Date Noted  . Abdominal wall hernia 10/30/2019  . Change in voice 10/30/2019  . History of  stroke 09/14/2019  . Spinal stenosis 07/24/2019  . Mixed hyperlipidemia   . OSA (obstructive sleep apnea)   . Dysfunction of eustachian tube 05/15/2019  . Other social stressor 03/29/2019  . 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 Diarrhea 06/20/2018  . Joint pain 06/20/2018  . 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 Medicare annual wellness visit, subsequent 09/15/2017  . SUI (stress urinary incontinence, female) 08/07/2017  . Back pain 06/16/2016  . Advance care planning  01/07/2016  . Sleep apnea   . 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 Allergic rhinitis 11/24/2014  . Adult BMI 30+ 11/24/2014  . Depression 11/24/2014  . Insomnia 08/23/2009  . Headache, migraine 05/10/2009  . HERPES SIMPLEX INFECTION 08/14/2006  . HYPERCHOLESTEROLEMIA 08/14/2006  . Anxiety state 08/14/2006  . Essential hypertension 08/14/2006  . HIATAL HERNIA 08/14/2006  . IRRITABLE BOWEL SYNDROME 08/14/2006  . 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 Primary osteoarthritis of right knee 08/14/2006  . PLANTAR FASCIITIS 08/14/2006    Printice Hellmer 12/07/2019, 1:38 PM Theone Murdoch, OTR/L [phone] Phone: [phone] 1:38 PM 12/07/19 [facility] [address] [place], [place], [address] Phone: [phone]   Fax:  [phone]  Name: [name] [name] MRN: [id number] Date of Birth: January 23, 1941

## 2019-12-07 NOTE — Therapy (Signed)
[facility] [address] [place], [place], [address] Phone: [phone]   Fax:  [phone]  Speech Language Pathology Treatment  Patient Details  Name: [name] [name] MRN: [id number] Date of Birth: [birth year] Referring Provider (SLP): Frann Rider, NP   Encounter Date: 12/07/2019   End of Session - 12/07/19 1233    Visit Number 6    Number of Visits 17    Date for SLP Re-Evaluation 12/28/19    SLP Start Time 1146    SLP Stop Time  1225    SLP Time Calculation (min) 39 min    Activity Tolerance Patient tolerated treatment well           Past Medical History:  Diagnosis Date  . Arthritis    knees  . Depression   . Fatty liver   . GERD (gastroesophageal reflux disease)   . Hyperlipidemia   . Hypertension   . IBS (irritable bowel syndrome)   . Obesity   . Pneumonia   . Sleep apnea    uses CPAP    Past Surgical History:  Procedure Laterality Date  . BLADDER SURGERY    . BREAST SURGERY     breast biopsy  . BROW LIFT Bilateral 04/14/2017   Procedure: BLEPHAROPLASTY UPPER EYELID WITH EXCESS SKIN;  Surgeon: Karle Starch, MD;  Location: [facility];  Service: Ophthalmology;  Laterality: Bilateral;  . CATARACT EXTRACTION W/PHACO Left 02/05/2016   Procedure: CATARACT EXTRACTION PHACO AND INTRAOCULAR LENS PLACEMENT (IOC);  Surgeon: Birder Robson, MD;  Location: [facility];  Service: Ophthalmology;  Laterality: Left;  [place] 01:10 AP% 22.3 CDE 15.67 Fluid pack lot # [id number]  . CATARACT EXTRACTION W/PHACO Right 02/26/2016   Procedure: CATARACT EXTRACTION PHACO AND INTRAOCULAR LENS PLACEMENT (IOC);  Surgeon: Birder Robson, MD;  Location: [facility];  Service: Ophthalmology;  Laterality: Right;  [place] 57.4 AP% 24.0 CDE 13.75 Fluid Pack lot # [id number]  . CHOLECYSTECTOMY    . COLONOSCOPY WITH PROPOFOL N/A 12/18/2014   Procedure: COLONOSCOPY WITH PROPOFOL;  Surgeon: Manya Silvas, MD;  Location: [facility]  ENDOSCOPY;  Service: Endoscopy;  Laterality: N/A;  . DEEP NECK LYMPH NODE BIOPSY / EXCISION    . JOINT REPLACEMENT    . KNEE ARTHROPLASTY Left 06/20/2015   Procedure: COMPUTER ASSISTED TOTAL KNEE ARTHROPLASTY;  Surgeon: Dereck Leep, MD;  Location: [facility];  Service: Orthopedics;  Laterality: Left;  . PTOSIS REPAIR Bilateral 04/14/2017   Procedure: PTOSIS REPAIR RESECT EX;  Surgeon: Karle Starch, MD;  Location: [facility];  Service: Ophthalmology;  Laterality: Bilateral;  sleep apnea  . TONSILLECTOMY    . TOTAL HIP ARTHROPLASTY Right 07/21/2018   Procedure: TOTAL HIP ARTHROPLASTY ANTERIOR APPROACH;  Surgeon: Gaynelle Arabian, MD;  Location: [facility];  Service: Orthopedics;  Laterality: Right;    There were no vitals filed for this visit.   Subjective Assessment - 12/07/19 1150    Subjective "My voice is getting better, I'm not as hoarse"    Currently in Pain? No/denies                 ADULT SLP TREATMENT - 12/07/19 1151      General Information   Behavior/Cognition Alert;Cooperative;Pleasant mood      Treatment Provided   Treatment provided Cognitive-Linquistic      Cognitive-Linquistic Treatment   Treatment focused on Voice;Patient/family/caregiver education    Skilled Treatment Pt reports that she has been practicing abdominal breathing projecting. Pt achieved clear phonation in  10+ word sentnences. Carried over clear phonation in structured task generating 3 sentence descriptions with occasional min A. In conversation, Parlee requires usual mod A for awareness of voice quality and carryover of breath support and volume to eliminate hoarseness      Assessment / Recommendations / Plan   Plan Continue with current plan of care      Progression Toward Goals   Progression toward goals Progressing toward goals            SLP Education - 12/07/19 1227    Education Details HEP for voice, breath support for voice    Person(s) Educated Patient    Methods  Explanation;Demonstration;Verbal cues    Comprehension Verbalized understanding;Returned demonstration;Verbal cues required;Need further instruction            SLP Short Term Goals - 12/07/19 1231      SLP SHORT TERM GOAL #1   Title Pt will complete HEP for labial weakness with mod I over 2 sessions    Time 2    Period Weeks    Status Achieved      SLP SHORT TERM GOAL #2   Title Pt will produce sibalant fricatives accurately at sentence level with rare min A 18/20 sentences    Time 2    Period Weeks    Status Achieved      SLP SHORT TERM GOAL #3   Title Pt will follow 3 reflux precautions over 3 sessions with mod I    Time 2    Period Weeks    Status On-going      SLP SHORT TERM GOAL #4   Title Pt will follow 3 vocal hygiene strategies over 3 sessions with mod I    Baseline voice goals to be modified pending ENT consult    Time 1    Period Weeks    Status On-going      SLP SHORT TERM GOAL #5   Title pt to demo abdominal breathing at rest 75% success over 5 minutes    Time 3    Period Weeks    Status New      SLP SHORT TERM GOAL #6   Title pt to complete tasks mitigating muscle tension dysphonia with rare min A over three sessions    Time 2    Period Weeks    Status On-going            SLP Long Term Goals - 12/07/19 1231      SLP LONG TERM GOAL #1   Title Pt will produce sibalant fricatives accurately in simple conversation over 15 minutes, self corrected as needed, with occasional min A over 2 session    Time 5    Period Weeks    Status On-going      SLP LONG TERM GOAL #2   Title Pt will achieve WNL phonation using strategies 15/20 sentences with occasional min A    Baseline may be modified following ENT consult    Time 5    Period Weeks    Status On-going      SLP LONG TERM GOAL #3   Title Pt will follow 4 vocal hygiene strategies over 3 sessions with rare min A    Time 5    Period Weeks    Status On-going      SLP LONG TERM GOAL #4   Title Pt  will improve Communicative Effectiveness Survey score by 2 points    Time 5    Period  Weeks    Status On-going      SLP LONG TERM GOAL #5   Title pt to use WNL voice quality in 8 minutes simple conversation x3 sessions    Time 6    Period Weeks    Status On-going      SLP LONG TERM GOAL #6   Title pt to demo PHorTE exercises with rare min A in 3 sessions    Time 7    Period Weeks    Status New            Plan - 12/07/19 1228    Clinical Impression Statement Ongoing training for HEP, 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 Brissa is achieving clear phonation in structured speech tasks, however requies mod A to carryover clear phonation and correct dysphonia at conversation level.    Speech Therapy Frequency 2x / week    Treatment/Interventions Aspiration precaution training;Environmental controls;Cueing hierarchy;Oral motor exercises;SLP instruction and feedback;Compensatory strategies;Functional tasks;Cognitive reorganization;Compensatory techniques;Diet toleration management by SLP;Internal/external aids;Multimodal communcation approach;Patient/family education    Potential to Achieve Goals Good           Patient will benefit from skilled therapeutic intervention in order to improve the following deficits and impairments:   Other voice and resonance disorders    Problem List Patient Active Problem List   Diagnosis Date Noted  . Abdominal wall hernia 10/30/2019  . Change in voice 10/30/2019  . History of stroke 09/14/2019  . Spinal stenosis 07/24/2019  . Mixed hyperlipidemia   . OSA (obstructive sleep apnea)   . Dysfunction of eustachian tube 05/15/2019  . Other social stressor 03/29/2019  . 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 Diarrhea 06/20/2018  . Joint pain 06/20/2018  . 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 Medicare annual wellness visit, subsequent 09/15/2017  . SUI (stress urinary incontinence, female)  08/07/2017  . Back pain 06/16/2016  . Advance care planning 01/07/2016  . Sleep apnea   . 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 Allergic rhinitis 11/24/2014  . Adult BMI 30+ 11/24/2014  . Depression 11/24/2014  . Insomnia 08/23/2009  . Headache, migraine 05/10/2009  . HERPES SIMPLEX INFECTION 08/14/2006  . HYPERCHOLESTEROLEMIA 08/14/2006  . Anxiety state 08/14/2006  . Essential hypertension 08/14/2006  . HIATAL HERNIA 08/14/2006  . IRRITABLE BOWEL SYNDROME 08/14/2006  . 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 Primary osteoarthritis of right knee 08/14/2006  . PLANTAR FASCIITIS 08/14/2006    Adiel Mcnamara, Annye Rusk MS, CCC-SLP 12/07/2019, 12:35 PM  [facility] [address] [place], [place], [address] Phone: [phone]   Fax:  [phone]   Name: [name] [name] MRN: [id number] Date of Birth: [birth year]

## 2019-12-09 ENCOUNTER — Ambulatory Visit: Payer: Medicare HMO | Admitting: Physical Therapy

## 2019-12-09 ENCOUNTER — Other Ambulatory Visit: Payer: Self-pay

## 2019-12-09 ENCOUNTER — Encounter: Payer: Self-pay | Admitting: Physical Therapy

## 2019-12-09 ENCOUNTER — Ambulatory Visit: Payer: Medicare HMO

## 2019-12-09 DIAGNOSIS — R471 Dysarthria and anarthria: Secondary | ICD-10-CM

## 2019-12-09 DIAGNOSIS — R2681 Unsteadiness on feet: Secondary | ICD-10-CM

## 2019-12-09 DIAGNOSIS — R2689 Other abnormalities of gait and mobility: Secondary | ICD-10-CM | POA: Diagnosis not present

## 2019-12-09 DIAGNOSIS — R278 Other lack of coordination: Secondary | ICD-10-CM | POA: Diagnosis not present

## 2019-12-09 DIAGNOSIS — K432 Incisional hernia without obstruction or gangrene: Secondary | ICD-10-CM | POA: Diagnosis not present

## 2019-12-09 DIAGNOSIS — R498 Other voice and resonance disorders: Secondary | ICD-10-CM

## 2019-12-09 DIAGNOSIS — M6281 Muscle weakness (generalized): Secondary | ICD-10-CM | POA: Diagnosis not present

## 2019-12-09 DIAGNOSIS — R262 Difficulty in walking, not elsewhere classified: Secondary | ICD-10-CM | POA: Diagnosis not present

## 2019-12-09 NOTE — Therapy (Signed)
[facility] [address] [place], [place], [address] Phone: [phone]   Fax:  [phone]  Physical Therapy Treatment  Patient Details  Name: [name] [name] MRN: [id number] Date of Birth: 07/17/1940 Referring Provider (PT): Referred by Frann Rider, NP, PCP is Elsie Stain, MD    Encounter Date: 12/09/2019   PT End of Session - 12/09/19 1233    Visit Number 10    Number of Visits 13    Date for PT Re-Evaluation 01/31/20    Authorization Type Medicare    PT Start Time 1230    PT Stop Time 1314    PT Time Calculation (min) 44 min    Equipment Utilized During Treatment Gait belt    Activity Tolerance Patient tolerated treatment well    Behavior During Therapy [facility] for tasks assessed/performed           Past Medical History:  Diagnosis Date  . Arthritis    knees  . Depression   . Fatty liver   . GERD (gastroesophageal reflux disease)   . Hyperlipidemia   . Hypertension   . IBS (irritable bowel syndrome)   . Obesity   . Pneumonia   . Sleep apnea    uses CPAP    Past Surgical History:  Procedure Laterality Date  . BLADDER SURGERY    . BREAST SURGERY     breast biopsy  . BROW LIFT Bilateral 04/14/2017   Procedure: BLEPHAROPLASTY UPPER EYELID WITH EXCESS SKIN;  Surgeon: Karle Starch, MD;  Location: [facility];  Service: Ophthalmology;  Laterality: Bilateral;  . CATARACT EXTRACTION W/PHACO Left 02/05/2016   Procedure: CATARACT EXTRACTION PHACO AND INTRAOCULAR LENS PLACEMENT (IOC);  Surgeon: Birder Robson, MD;  Location: [facility];  Service: Ophthalmology;  Laterality: Left;  [place] 01:10 AP% 22.3 CDE 15.67 Fluid pack lot # [id number]  . CATARACT EXTRACTION W/PHACO Right 02/26/2016   Procedure: CATARACT EXTRACTION PHACO AND INTRAOCULAR LENS PLACEMENT (IOC);  Surgeon: Birder Robson, MD;  Location: [facility];  Service: Ophthalmology;  Laterality: Right;  [place] 57.4 AP% 24.0 CDE 13.75 Fluid Pack lot #  [id number]  . CHOLECYSTECTOMY    . COLONOSCOPY WITH PROPOFOL N/A 12/18/2014   Procedure: COLONOSCOPY WITH PROPOFOL;  Surgeon: Manya Silvas, MD;  Location: [facility];  Service: Endoscopy;  Laterality: N/A;  . DEEP NECK LYMPH NODE BIOPSY / EXCISION    . JOINT REPLACEMENT    . KNEE ARTHROPLASTY Left 06/20/2015   Procedure: COMPUTER ASSISTED TOTAL KNEE ARTHROPLASTY;  Surgeon: Dereck Leep, MD;  Location: [facility];  Service: Orthopedics;  Laterality: Left;  . PTOSIS REPAIR Bilateral 04/14/2017   Procedure: PTOSIS REPAIR RESECT EX;  Surgeon: Karle Starch, MD;  Location: [facility];  Service: Ophthalmology;  Laterality: Bilateral;  sleep apnea  . TONSILLECTOMY    . TOTAL HIP ARTHROPLASTY Right 07/21/2018   Procedure: TOTAL HIP ARTHROPLASTY ANTERIOR APPROACH;  Surgeon: Gaynelle Arabian, MD;  Location: [facility];  Service: Orthopedics;  Laterality: Right;    There were no vitals filed for this visit.   Subjective Assessment - 12/09/19 1231    Subjective Pt. has no complaints of pain, no falls and no changes since last visit. Pt. states that she feels a little "wobbly" today.    Patient is accompained by: Family member   cousin   Pertinent History Obesity, hypertension, hyperlipidemia, Lt sciatic nerve pain, sleep apnea on CPAP.    Limitations Walking;Standing;Sitting    Currently in Pain? No/denies  Pain Score 0-No pain    Multiple Pain Sites No              [facility] PT Assessment - 12/09/19 1233      Functional Gait  Assessment   Gait assessed  Yes    Gait Level Surface Walks 20 ft in less than 7 sec but greater than 5.5 sec, uses assistive device, slower speed, mild gait deviations, or deviates 6-10 in outside of the 12 in walkway width.    Change in Gait Speed Able to smoothly change walking speed without loss of balance or gait deviation. Deviate no more than 6 in outside of the 12 in walkway width.    Gait with Horizontal Head Turns Performs head turns smoothly with slight  change in gait velocity (eg, minor disruption to smooth gait path), deviates 6-10 in outside 12 in walkway width, or uses an assistive device.    Gait with Vertical Head Turns Performs task with slight change in gait velocity (eg, minor disruption to smooth gait path), deviates 6 - 10 in outside 12 in walkway width or uses assistive device    Gait and Pivot Turn Pivot turns safely within 3 sec and stops quickly with no loss of balance.    Step Over Obstacle Is able to step over one shoe box (4.5 in total height) without changing gait speed. No evidence of imbalance.    Gait with Narrow Base of Support Ambulates 7-9 steps.    Gait with Eyes Closed Walks 20 ft, uses assistive device, slower speed, mild gait deviations, deviates 6-10 in outside 12 in walkway width. Ambulates 20 ft in less than 9 sec but greater than 7 sec.    Ambulating Backwards Walks 20 ft, uses assistive device, slower speed, mild gait deviations, deviates 6-10 in outside 12 in walkway width.    Steps Alternating feet, must use rail.    Total Score 22               Balance Exercises - 12/09/19 1256      Balance Exercises: Standing   Standing Eyes Opened Narrow base of support (BOS);Foam/compliant surface;1 rep;30 secs;Limitations    Standing Eyes Opened Limitations pt. performed on blue airex pad with no UE support    Rockerboard Lateral;30 seconds;EO;Head turns;Limitations    Rockerboard Limitations static hold on rockerboard with EO and horizontal head turns    Tandem Gait Forward;Retro;Intermittent upper extremity support;Foam/compliant surface;3 reps;Limitations    Tandem Gait Limitations performed in parallel bars; 4 laps with fwd and retro gait on blue foam beam with intermittent UE support; pt. attempted no UE support but could not complete the lap.    Partial Tandem Stance Eyes closed;Foam/compliant surface;Eyes open;3 reps;30 secs;Limitations    Partial Tandem Stance Limitations performed on blue airex pad with no  UE support; 3 reps with ea foot fwd               PT Short Term Goals - 12/09/19 1316      PT SHORT TERM GOAL #1   Title Patient will be independent with Initial HEP    Time 3    Period Weeks    Status On-going    Target Date 11/23/19      PT SHORT TERM GOAL #2   Title Patient will improve 30 second chair stand test to >/= 12 reps w/ UE support to demo improved BLE strength    Baseline 10 sit <> stands, 12 sit <> stands on 11/25/19  Time 3    Period Weeks    Status Achieved    Target Date 11/23/19      PT SHORT TERM GOAL #3   Title Patient will improve FGA to >/= 20/30 to demonstrate improved balance and reduced risk for falls    Baseline 17/30, 19/30 on 11/25/19, 22/30 on 12/09/19    Time 3    Period Weeks    Status Achieved    Target Date 11/23/19      PT SHORT TERM GOAL #4   Title Patient will demonstrate ability to ambulate >/= 500 ft w/o AD to demonstrate improved community mobility and ability to ambulate within neighborhood    Time 3    Period Weeks    Status Achieved    Target Date 11/23/19             PT Long Term Goals - 11/02/19 1332      PT LONG TERM GOAL #1   Title Pt will be independent with Final HEP    Time 6    Period Weeks    Status New    Target Date 12/14/19      PT LONG TERM GOAL #2   Title Patient will improve gait speed to >/= 3.0 ft/sec to demonstrate improved community mobility    Baseline 2.2 ft/sec    Time 6    Period Weeks    Status New    Target Date 12/14/19      PT LONG TERM GOAL #3   Title Patient will improve FGA to >/= 24/30 to demonstrate reduced risk for falls    Baseline 17/30    Time 6    Period Weeks    Status New    Target Date 12/14/19      PT LONG TERM GOAL #4   Title Patient will demonstrate ability to complete TUG <9 secs w/o AD to demonstrate improved mobility    Baseline 10.97    Time 6    Period Weeks    Status New    Target Date 12/14/19      PT LONG TERM GOAL #5   Title Patient will  demonstrate ability to ambulate >/= 600 ft outdoors on unlevel surfaces, Mod I, to demonstrate improved ability to ambulate around neighborhood    Time 6    Period Weeks    Status New    Target Date 12/14/19                 Plan - 12/09/19 1320    Clinical Impression Statement Todays therapy session included assessing the pt's STG of scoring >/= 20/30 on the FGA, and today she scored a 22/30 displaying achievment of this goal. The pt. is progressing well and is now able to perform higher level balance activities during therapy. Several balance activities were utilized today with horizontal head turns to simulate environment scanning while ambulating in the community. When performing balance on foam pad, pt. had a few complaints of increased pain in her R knee, but was able to tolerate activities. Pt.will benefit from further PT in order to progress toward her unmet and LTG's.    Personal Factors and Comorbidities Comorbidity 3+    Comorbidities Obesity, hypertension, hyperlipidemia, Lt sciatic nerve pain, sleep apnea on CPAP    Examination-Activity Limitations Squat;Transfers;Stairs;Stand    Examination-Participation Restrictions Community Activity;Cleaning    Stability/Clinical Decision Making Evolving/Moderate complexity    Rehab Potential Good    PT Frequency 2x / week  PT Duration 6 weeks    PT Treatment/Interventions ADLs/Self Care Home Management;Moist Heat;Cryotherapy;Electrical Stimulation;DME Instruction;Gait training;Stair training;Functional mobility training;Therapeutic activities;Therapeutic exercise;Balance training;Neuromuscular re-education;Patient/family education;Orthotic Fit/Training;Passive range of motion    PT Next Visit Plan Continue dynamic balance, functional strengthening, corner balance with eyes closed/head motions. Scifit or Nustep?    Consulted and Agree with Plan of Care Patient           Patient will benefit from skilled therapeutic intervention in  order to improve the following deficits and impairments:  Abnormal gait, Decreased balance, Decreased endurance, Difficulty walking, Decreased activity tolerance, Decreased safety awareness, Pain, Postural dysfunction, Decreased strength  Visit Diagnosis: Muscle weakness (generalized)  Other lack of coordination  Unsteadiness on feet     Problem List Patient Active Problem List   Diagnosis Date Noted  . Abdominal wall hernia 10/30/2019  . Change in voice 10/30/2019  . History of stroke 09/14/2019  . Spinal stenosis 07/24/2019  . Mixed hyperlipidemia   . OSA (obstructive sleep apnea)   . Dysfunction of eustachian tube 05/15/2019  . Other social stressor 03/29/2019  . 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 Diarrhea 06/20/2018  . Joint pain 06/20/2018  . 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 Medicare annual wellness visit, subsequent 09/15/2017  . SUI (stress urinary incontinence, female) 08/07/2017  . Back pain 06/16/2016  . Advance care planning 01/07/2016  . Sleep apnea   . 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 Allergic rhinitis 11/24/2014  . Adult BMI 30+ 11/24/2014  . Depression 11/24/2014  . Insomnia 08/23/2009  . Headache, migraine 05/10/2009  . HERPES SIMPLEX INFECTION 08/14/2006  . HYPERCHOLESTEROLEMIA 08/14/2006  . Anxiety state 08/14/2006  . Essential hypertension 08/14/2006  . HIATAL HERNIA 08/14/2006  . IRRITABLE BOWEL SYNDROME 08/14/2006  . 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 Primary osteoarthritis of right knee 08/14/2006  . PLANTAR FASCIITIS 08/14/2006    Royann Shivers, [place] 12/09/2019, 1:30 PM  [facility] [address] [place], [place], [address] Phone: [phone]   Fax:  [phone]  Name: [name] [name] MRN: [id number] Date of Birth: 1941-05-15

## 2019-12-09 NOTE — Patient Instructions (Signed)
  Please complete the assigned speech therapy homework prior to your next session.  

## 2019-12-09 NOTE — Therapy (Signed)
[facility] [address] [place], [place], [address] Phone: [phone]   Fax:  [phone]  Speech Language Pathology Treatment  Patient Details  Name: [name] [name] MRN: [id number] Date of Birth: 20-Apr-1941 Referring Provider (SLP): Frann Rider, NP   Encounter Date: 12/09/2019   End of Session - 12/09/19 1420    Visit Number 7    Number of Visits 17    Date for SLP Re-Evaluation 12/28/19    SLP Start Time 1    SLP Stop Time  1400    SLP Time Calculation (min) 42 min    Activity Tolerance Patient tolerated treatment well           Past Medical History:  Diagnosis Date  . Arthritis    knees  . Depression   . Fatty liver   . GERD (gastroesophageal reflux disease)   . Hyperlipidemia   . Hypertension   . IBS (irritable bowel syndrome)   . Obesity   . Pneumonia   . Sleep apnea    uses CPAP    Past Surgical History:  Procedure Laterality Date  . BLADDER SURGERY    . BREAST SURGERY     breast biopsy  . BROW LIFT Bilateral 04/14/2017   Procedure: BLEPHAROPLASTY UPPER EYELID WITH EXCESS SKIN;  Surgeon: Karle Starch, MD;  Location: [facility];  Service: Ophthalmology;  Laterality: Bilateral;  . CATARACT EXTRACTION W/PHACO Left 02/05/2016   Procedure: CATARACT EXTRACTION PHACO AND INTRAOCULAR LENS PLACEMENT (IOC);  Surgeon: Birder Robson, MD;  Location: [facility];  Service: Ophthalmology;  Laterality: Left;  [place] 01:10 AP% 22.3 CDE 15.67 Fluid pack lot # [id number]  . CATARACT EXTRACTION W/PHACO Right 02/26/2016   Procedure: CATARACT EXTRACTION PHACO AND INTRAOCULAR LENS PLACEMENT (IOC);  Surgeon: Birder Robson, MD;  Location: [facility];  Service: Ophthalmology;  Laterality: Right;  [place] 57.4 AP% 24.0 CDE 13.75 Fluid Pack lot # [id number]  . CHOLECYSTECTOMY    . COLONOSCOPY WITH PROPOFOL N/A 12/18/2014   Procedure: COLONOSCOPY WITH PROPOFOL;  Surgeon: Manya Silvas, MD;  Location: [facility]  ENDOSCOPY;  Service: Endoscopy;  Laterality: N/A;  . DEEP NECK LYMPH NODE BIOPSY / EXCISION    . JOINT REPLACEMENT    . KNEE ARTHROPLASTY Left 06/20/2015   Procedure: COMPUTER ASSISTED TOTAL KNEE ARTHROPLASTY;  Surgeon: Dereck Leep, MD;  Location: [facility];  Service: Orthopedics;  Laterality: Left;  . PTOSIS REPAIR Bilateral 04/14/2017   Procedure: PTOSIS REPAIR RESECT EX;  Surgeon: Karle Starch, MD;  Location: [facility];  Service: Ophthalmology;  Laterality: Bilateral;  sleep apnea  . TONSILLECTOMY    . TOTAL HIP ARTHROPLASTY Right 07/21/2018   Procedure: TOTAL HIP ARTHROPLASTY ANTERIOR APPROACH;  Surgeon: Gaynelle Arabian, MD;  Location: [facility];  Service: Orthopedics;  Laterality: Right;    There were no vitals filed for this visit.   Subjective Assessment - 12/09/19 1330    Subjective "This is going to be a real problem if I have to keep on thinking about how I talk all the time."    Patient is accompained by: Family member    Currently in Pain? No/denies                 ADULT SLP TREATMENT - 12/09/19 1333      General Information   Behavior/Cognition Alert;Cooperative;Pleasant mood      Treatment Provided   Treatment provided Cognitive-Linquistic      Cognitive-Linquistic Treatment   Treatment focused on  Voice;Patient/family/caregiver education    Skilled Treatment Pt reports that she has not completed any homework since her previous session. She showed pt a stack of papers and SLP referred her to the latest AVS for her homework, from Wednesday 12-07-10. SLP reiterated to pt, given her "s" statement, that practicing will make it so that she will not have to "thnk about how (she) talk(s)." SLP had pt read sentences of 9-12 words, while practicing abdominal breathing and projecting her voice - 90% success. Pt achieved clear phonation with spontaneous short answers to questions, ohwever between stimuli voice was not WNL unless nonverbal cues by SLP.       Assessment  / Recommendations / Plan   Plan Continue with current plan of care      Progression Toward Goals   Progression toward goals Progressing toward goals            SLP Education - 12/09/19 1420    Education Details she will need to practice in order to achieve WNL voicing habitually    Person(s) Educated Patient    Methods Explanation    Comprehension Verbalized understanding            SLP Short Term Goals - 12/09/19 1422      SLP SHORT TERM GOAL #1   Title Pt will complete HEP for labial weakness with mod I over 2 sessions    Time 2    Period Weeks    Status Achieved      SLP SHORT TERM GOAL #2   Title Pt will produce sibalant fricatives accurately at sentence level with rare min A 18/20 sentences    Time 2    Period Weeks    Status Achieved      SLP SHORT TERM GOAL #3   Title Pt will follow 3 reflux precautions over 3 sessions with mod I    Time 2    Period Weeks    Status On-going      SLP SHORT TERM GOAL #4   Title Pt will follow 3 vocal hygiene strategies over 3 sessions with mod I    Baseline voice goals to be modified pending ENT consult    Time 1    Period Weeks    Status On-going      SLP SHORT TERM GOAL #5   Title pt to demo abdominal breathing at rest 75% success over 5 minutes    Time 3    Period Weeks    Status On-going      SLP SHORT TERM GOAL #6   Title pt to complete tasks mitigating muscle tension dysphonia with rare min A over three sessions    Time 2    Period Weeks    Status On-going            SLP Long Term Goals - 12/09/19 1422      SLP LONG TERM GOAL #1   Title Pt will produce sibalant fricatives accurately in simple conversation over 15 minutes, self corrected as needed, with occasional min A over 2 session    Time 5    Period Weeks    Status On-going      SLP LONG TERM GOAL #2   Title Pt will achieve WNL phonation using strategies 15/20 sentences with occasional min A    Baseline may be modified following ENT consult     Time 5    Period Weeks    Status On-going      SLP LONG  TERM GOAL #3   Title Pt will follow 4 vocal hygiene strategies over 3 sessions with rare min A    Time 5    Period Weeks    Status On-going      SLP LONG TERM GOAL #4   Title Pt will improve Communicative Effectiveness Survey score by 2 points    Time 5    Period Weeks    Status On-going      SLP LONG TERM GOAL #5   Title pt to use WNL voice quality in 8 minutes simple conversation x3 sessions    Time 6    Period Weeks    Status On-going      SLP LONG TERM GOAL #6   Title pt to demo PHorTE exercises with rare min A in 3 sessions    Time 7    Period Weeks    Status New            Plan - 12/09/19 1421    Clinical Impression Statement Ongoing training for HEP, 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 [name] [name] is achieving clear phonation in structured speech tasks and shorter spontaneous speech tasks (phrase/sentence level). However carryover for clear phonation and correct dysphonia at conversation level remains challenging at this time. Skilled ST remains necessary to habitualize WNL vocal quality.    Speech Therapy Frequency 2x / week    Treatment/Interventions Aspiration precaution training;Environmental controls;Cueing hierarchy;Oral motor exercises;SLP instruction and feedback;Compensatory strategies;Functional tasks;Cognitive reorganization;Compensatory techniques;Diet toleration management by SLP;Internal/external aids;Multimodal communcation approach;Patient/family education    Potential to Achieve Goals Good           Patient will benefit from skilled therapeutic intervention in order to improve the following deficits and impairments:   Other voice and resonance disorders  Dysarthria and anarthria    Problem List Patient Active Problem List   Diagnosis Date Noted  . Abdominal wall hernia 10/30/2019  . Change in voice 10/30/2019  . History of stroke 09/14/2019  . Spinal stenosis  07/24/2019  . Mixed hyperlipidemia   . OSA (obstructive sleep apnea)   . Dysfunction of eustachian tube 05/15/2019  . Other social stressor 03/29/2019  . 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 Diarrhea 06/20/2018  . Joint pain 06/20/2018  . 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 Medicare annual wellness visit, subsequent 09/15/2017  . SUI (stress urinary incontinence, female) 08/07/2017  . Back pain 06/16/2016  . Advance care planning 01/07/2016  . Sleep apnea   . 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 Allergic rhinitis 11/24/2014  . Adult BMI 30+ 11/24/2014  . Depression 11/24/2014  . Insomnia 08/23/2009  . Headache, migraine 05/10/2009  . HERPES SIMPLEX INFECTION 08/14/2006  . HYPERCHOLESTEROLEMIA 08/14/2006  . Anxiety state 08/14/2006  . Essential hypertension 08/14/2006  . HIATAL HERNIA 08/14/2006  . IRRITABLE BOWEL SYNDROME 08/14/2006  . 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 Primary osteoarthritis of right knee 08/14/2006  . PLANTAR FASCIITIS 08/14/2006    [place] ,[place], CCC-SLP  12/09/2019, 2:23 PM  [facility] [address] [place], [place], [address] Phone: [phone]   Fax:  [phone]   Name: CADINCE HILSCHER MRN: [id number] Date of Birth: 06-28-1940

## 2019-12-11 ENCOUNTER — Ambulatory Visit (HOSPITAL_COMMUNITY)
Admission: RE | Admit: 2019-12-11 | Discharge: 2019-12-11 | Disposition: A | Discharge status: Home / Self Care | Payer: Medicare HMO | Source: Ambulatory Visit | Attending: Neurology | Admitting: Neurology

## 2019-12-11 DIAGNOSIS — I6389 Other cerebral infarction: Secondary | ICD-10-CM | POA: Diagnosis not present

## 2019-12-11 DIAGNOSIS — I6330 Cerebral infarction due to thrombosis of unspecified cerebral artery: Secondary | ICD-10-CM | POA: Diagnosis not present

## 2019-12-11 DIAGNOSIS — R9082 White matter disease, unspecified: Secondary | ICD-10-CM | POA: Diagnosis not present

## 2019-12-11 NOTE — Progress Notes (Signed)
Kindly inform the patient that stroke study specific MRI scan of the brain shows expected changes in her right brain stroke.  No new or worrisome findings

## 2019-12-14 ENCOUNTER — Ambulatory Visit: Payer: Medicare HMO | Admitting: Speech Pathology

## 2019-12-14 ENCOUNTER — Encounter: Payer: Self-pay | Admitting: Speech Pathology

## 2019-12-14 ENCOUNTER — Other Ambulatory Visit: Payer: Self-pay

## 2019-12-14 ENCOUNTER — Ambulatory Visit: Payer: Medicare HMO

## 2019-12-14 DIAGNOSIS — R471 Dysarthria and anarthria: Secondary | ICD-10-CM

## 2019-12-14 DIAGNOSIS — R2689 Other abnormalities of gait and mobility: Secondary | ICD-10-CM | POA: Diagnosis not present

## 2019-12-14 DIAGNOSIS — R498 Other voice and resonance disorders: Secondary | ICD-10-CM | POA: Diagnosis not present

## 2019-12-14 DIAGNOSIS — R2681 Unsteadiness on feet: Secondary | ICD-10-CM

## 2019-12-14 DIAGNOSIS — R262 Difficulty in walking, not elsewhere classified: Secondary | ICD-10-CM | POA: Diagnosis not present

## 2019-12-14 DIAGNOSIS — M6281 Muscle weakness (generalized): Secondary | ICD-10-CM | POA: Diagnosis not present

## 2019-12-14 DIAGNOSIS — R278 Other lack of coordination: Secondary | ICD-10-CM | POA: Diagnosis not present

## 2019-12-14 NOTE — Therapy (Signed)
[facility] [address] [place], [place], [address] Phone: [phone]   Fax:  [phone]  Physical Therapy Treatment  Patient Details  Name: [name] [name] MRN: [id number] Date of Birth: 1940-06-28 Referring Provider (PT): Referred by Frann Rider, NP, PCP is Elsie Stain, MD    Encounter Date: 12/14/2019   PT End of Session - 12/14/19 0825    Visit Number 11    Number of Visits 18    Date for PT Re-Evaluation 02/12/20   updated POC for 6 weeks, cert for 60 days   Authorization Type Medicare (10th visit PN)    PT Start Time 0801    PT Stop Time 0843    PT Time Calculation (min) 42 min    Equipment Utilized During Treatment Gait belt    Activity Tolerance Patient tolerated treatment well    Behavior During Therapy [facility] for tasks assessed/performed           Past Medical History:  Diagnosis Date  . Arthritis    knees  . Depression   . Fatty liver   . GERD (gastroesophageal reflux disease)   . Hyperlipidemia   . Hypertension   . IBS (irritable bowel syndrome)   . Obesity   . Pneumonia   . Sleep apnea    uses CPAP    Past Surgical History:  Procedure Laterality Date  . BLADDER SURGERY    . BREAST SURGERY     breast biopsy  . BROW LIFT Bilateral 04/14/2017   Procedure: BLEPHAROPLASTY UPPER EYELID WITH EXCESS SKIN;  Surgeon: Karle Starch, MD;  Location: [facility];  Service: Ophthalmology;  Laterality: Bilateral;  . CATARACT EXTRACTION W/PHACO Left 02/05/2016   Procedure: CATARACT EXTRACTION PHACO AND INTRAOCULAR LENS PLACEMENT (IOC);  Surgeon: Birder Robson, MD;  Location: [facility];  Service: Ophthalmology;  Laterality: Left;  [place] 01:10 AP% 22.3 CDE 15.67 Fluid pack lot # [id number]  . CATARACT EXTRACTION W/PHACO Right 02/26/2016   Procedure: CATARACT EXTRACTION PHACO AND INTRAOCULAR LENS PLACEMENT (IOC);  Surgeon: Birder Robson, MD;  Location: [facility];  Service: Ophthalmology;   Laterality: Right;  [place] 57.4 AP% 24.0 CDE 13.75 Fluid Pack lot # [id number]  . CHOLECYSTECTOMY    . COLONOSCOPY WITH PROPOFOL N/A 12/18/2014   Procedure: COLONOSCOPY WITH PROPOFOL;  Surgeon: Manya Silvas, MD;  Location: [facility];  Service: Endoscopy;  Laterality: N/A;  . DEEP NECK LYMPH NODE BIOPSY / EXCISION    . JOINT REPLACEMENT    . KNEE ARTHROPLASTY Left 06/20/2015   Procedure: COMPUTER ASSISTED TOTAL KNEE ARTHROPLASTY;  Surgeon: Dereck Leep, MD;  Location: [facility];  Service: Orthopedics;  Laterality: Left;  . PTOSIS REPAIR Bilateral 04/14/2017   Procedure: PTOSIS REPAIR RESECT EX;  Surgeon: Karle Starch, MD;  Location: [facility];  Service: Ophthalmology;  Laterality: Bilateral;  sleep apnea  . TONSILLECTOMY    . TOTAL HIP ARTHROPLASTY Right 07/21/2018   Procedure: TOTAL HIP ARTHROPLASTY ANTERIOR APPROACH;  Surgeon: Gaynelle Arabian, MD;  Location: [facility];  Service: Orthopedics;  Laterality: Right;    There were no vitals filed for this visit.   Subjective Assessment - 12/14/19 0805    Subjective Patient reports did alot of walking yesterday at [place]. Patient reports some soreness in the legs from walking, legs feel stiff.    Patient is accompained by: Family member   cousin   Pertinent History Obesity, hypertension, hyperlipidemia, Lt sciatic nerve pain, sleep apnea on CPAP.  Limitations Walking;Standing;Sitting    Currently in Pain? Yes    Pain Location Leg    Pain Orientation Right;Left    Pain Descriptors / Indicators --   general soreness                            [facility] Adult PT Treatment/Exercise - 12/14/19 0001      Ambulation/Gait   Ambulation/Gait Yes    Ambulation/Gait Assistance 5: Supervision;6: Modified independent (Device/Increase time)    Ambulation/Gait Assistance Details patient requiring supervision on unlevel grass surfaces at this time, Mod I on pavement surfaces    Ambulation Distance (Feet) 700 Feet     Assistive device None    Gait Pattern Step-through pattern;Decreased arm swing - right;Decreased arm swing - left;Decreased dorsiflexion - right;Decreased dorsiflexion - left;Poor foot clearance - left;Ataxic    Ambulation Surface Level;Indoor    Gait velocity 10.38 secs = 3.16 ft/sec      Standardized Balance Assessment   Standardized Balance Assessment Timed Up and Go Test      Timed Up and Go Test   TUG Normal TUG    Normal TUG (seconds) 9.69      High Level Balance   High Level Balance Activities --      Neuro Re-ed    Neuro Re-ed Details  Completed forward ambulation over blue mat, with alternating toe tap with crossover to cones along side, alternating completion with BLE. Patient utizling light UE support from // bars for proper completion.               Balance Exercises - 12/14/19 0001      Balance Exercises: Standing   Tandem Gait Forward;Retro;Foam/compliant surface;3 reps    Tandem Gait Limitations performed on blue mat, forward and back x 4 laps with intermittent UE support as needed.     Other Standing Exercises Completed FT and EO/EC on downward/upward incline, 2 x 30 seconds each.                PT Short Term Goals - 12/09/19 1316      PT SHORT TERM GOAL #1   Title Patient will be independent with Initial HEP    Time 3    Period Weeks    Status On-going    Target Date 11/23/19      PT SHORT TERM GOAL #2   Title Patient will improve 30 second chair stand test to >/= 12 reps w/ UE support to demo improved BLE strength    Baseline 10 sit <> stands, 12 sit <> stands on 11/25/19    Time 3    Period Weeks    Status Achieved    Target Date 11/23/19      PT SHORT TERM GOAL #3   Title Patient will improve FGA to >/= 20/30 to demonstrate improved balance and reduced risk for falls    Baseline 17/30, 19/30 on 11/25/19, 22/30 on 12/09/19    Time 3    Period Weeks    Status Achieved    Target Date 11/23/19      PT SHORT TERM GOAL #4   Title Patient  will demonstrate ability to ambulate >/= 500 ft w/o AD to demonstrate improved community mobility and ability to ambulate within neighborhood    Time 3    Period Weeks    Status Achieved    Target Date 11/23/19  PT Long Term Goals - 12/14/19 0809      PT LONG TERM GOAL #1   Title Pt will be independent with Final HEP    Baseline Continues to update HEP at this time    Time 6    Period Weeks    Status On-going      PT LONG TERM GOAL #2   Title Patient will improve gait speed to >/= 3.0 ft/sec to demonstrate improved community mobility    Baseline 2.2 ft/sec, 3.16 ft/sec    Time 6    Period Weeks    Status Achieved      PT LONG TERM GOAL #3   Title Patient will improve FGA to >/= 24/30 to demonstrate reduced risk for falls    Baseline 17/30, 22/30 on 12/14/19    Time 6    Period Weeks    Status On-going      PT LONG TERM GOAL #4   Title Patient will demonstrate ability to complete TUG <9 secs w/o AD to demonstrate improved mobility    Baseline 10.97, 9.69 secs on 12/14/19    Time 6    Period Weeks    Status On-going      PT LONG TERM GOAL #5   Title Patient will demonstrate ability to ambulate >/= 600 ft outdoors on unlevel surfaces, Mod I, to demonstrate improved ability to ambulate around neighborhood    Baseline 700 ft Mod I on pavement, supv on grass    Time 6    Period Weeks    Status Partially Met          NEW SHORT TERM GOALS:    PT Short Term Goals - 12/14/19 1247      PT SHORT TERM GOAL #1   Title Patient will demonstrate ability to ambulate >200 ft on grass surfaces with LRAD at Supervision level    Baseline supervision, <100 ft    Time 3    Period Weeks    Status New    Target Date 01/04/20      PT SHORT TERM GOAL #2   Target Date 11/23/19           NEW LONG TERM GOALS:    PT Long Term Goals - 12/14/19 0809      PT LONG TERM GOAL #1   Title Pt will be independent and demo compliance with Final balance/strengthening HEP     Baseline Continues to update HEP at this time    Time 6    Period Weeks    Status Revised    Target Date 01/25/20      PT LONG TERM GOAL #2   Title --    Baseline --    Time --    Period --    Status --      PT LONG TERM GOAL #3   Title Patient will improve FGA to >/= 24/30 to demonstrate reduced risk for falls    Baseline 17/30, 22/30 on 12/14/19    Time 6    Period Weeks    Status On-going    Target Date 01/25/20      PT LONG TERM GOAL #4   Title Patient will demonstrate ability to complete TUG <9 secs w/o AD to demonstrate improved mobility    Baseline 10.97, 9.69 secs on 12/14/19    Time 6    Period Weeks    Status On-going    Target Date 01/25/20      PT LONG TERM  GOAL #5   Title Patient will demonstrate ability to ambulate > 200 ft on grass surfaces, Mod I, to demonstrate improved ability to ambulate in yard    Baseline supervision on grass, <100 ft    Time 6    Period Weeks    Status New    Target Date 01/25/20             Plan - 12/14/19 1251    Clinical Impression Statement Today's skilled PT session included assessment of patient's progress toward LTG's. Patient demo improved gait speed, dynamic balance, and balance on unlevel surfaces. Patient is demonstrating progress with PT services, and will continue to benefit from therapy services to further improve balance and reduce risk for falls.    Personal Factors and Comorbidities Comorbidity 3+    Comorbidities Obesity, hypertension, hyperlipidemia, Lt sciatic nerve pain, sleep apnea on CPAP    Examination-Activity Limitations Squat;Transfers;Stairs;Stand    Examination-Participation Restrictions Community Activity;Cleaning    Stability/Clinical Decision Making Evolving/Moderate complexity    Rehab Potential Good    PT Frequency 2x / week    PT Duration 2 weeks   followed by 1x/week for 4 weeks   PT Treatment/Interventions ADLs/Self Care Home Management;Moist Heat;Cryotherapy;Electrical Stimulation;DME  Instruction;Gait training;Stair training;Functional mobility training;Therapeutic activities;Therapeutic exercise;Balance training;Neuromuscular re-education;Patient/family education;Orthotic Fit/Training;Passive range of motion    PT Next Visit Plan Continue dynamic balance, functional strengthening, corner balance with eyes closed/head motions. Scifit or Nustep?    Consulted and Agree with Plan of Care Patient             Patient will benefit from skilled therapeutic intervention in order to improve the following deficits and impairments:     Visit Diagnosis: Muscle weakness (generalized)  Unsteadiness on feet  Other abnormalities of gait and mobility  Difficulty in walking, not elsewhere classified     Problem List Patient Active Problem List   Diagnosis Date Noted  . Abdominal wall hernia 10/30/2019  . Change in voice 10/30/2019  . History of stroke 09/14/2019  . Spinal stenosis 07/24/2019  . Mixed hyperlipidemia   . OSA (obstructive sleep apnea)   . Dysfunction of eustachian tube 05/15/2019  . Other social stressor 03/29/2019  . 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 Diarrhea 06/20/2018  . Joint pain 06/20/2018  . 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 Medicare annual wellness visit, subsequent 09/15/2017  . SUI (stress urinary incontinence, female) 08/07/2017  . Back pain 06/16/2016  . Advance care planning 01/07/2016  . Sleep apnea   . 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 Allergic rhinitis 11/24/2014  . Adult BMI 30+ 11/24/2014  . Depression 11/24/2014  . Insomnia 08/23/2009  . Headache, migraine 05/10/2009  . HERPES SIMPLEX INFECTION 08/14/2006  . HYPERCHOLESTEROLEMIA 08/14/2006  . Anxiety state 08/14/2006  . Essential hypertension 08/14/2006  . HIATAL HERNIA 08/14/2006  . IRRITABLE BOWEL SYNDROME 08/14/2006  . 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 Primary osteoarthritis of right knee 08/14/2006  .  PLANTAR FASCIITIS 08/14/2006    Jones Bales, PT, DPT 12/14/2019, 11:41 AM  [facility] [address] [place], [place], [address] Phone: [phone]   Fax:  [phone]  Name: BIRDENA KINGMA MRN: [id number] Date of Birth: 07/18/1940

## 2019-12-14 NOTE — Therapy (Signed)
[facility] [address] [place], [place], [address] Phone: [phone]   Fax:  [phone]  Speech Language Pathology Treatment  Patient Details  Name: [name] [name] MRN: [id number] Date of Birth: 05-02-1941 Referring Provider (SLP): Frann Rider, NP   Encounter Date: 12/14/2019   End of Session - 12/14/19 1010    Visit Number 8    Number of Visits 17    Date for SLP Re-Evaluation 12/28/19    SLP Start Time 0932    SLP Stop Time  1013    SLP Time Calculation (min) 41 min    Activity Tolerance Patient tolerated treatment well           Past Medical History:  Diagnosis Date  . Arthritis    knees  . Depression   . Fatty liver   . GERD (gastroesophageal reflux disease)   . Hyperlipidemia   . Hypertension   . IBS (irritable bowel syndrome)   . Obesity   . Pneumonia   . Sleep apnea    uses CPAP    Past Surgical History:  Procedure Laterality Date  . BLADDER SURGERY    . BREAST SURGERY     breast biopsy  . BROW LIFT Bilateral 04/14/2017   Procedure: BLEPHAROPLASTY UPPER EYELID WITH EXCESS SKIN;  Surgeon: Karle Starch, MD;  Location: [facility];  Service: Ophthalmology;  Laterality: Bilateral;  . CATARACT EXTRACTION W/PHACO Left 02/05/2016   Procedure: CATARACT EXTRACTION PHACO AND INTRAOCULAR LENS PLACEMENT (IOC);  Surgeon: Birder Robson, MD;  Location: [facility];  Service: Ophthalmology;  Laterality: Left;  [place] 01:10 AP% 22.3 CDE 15.67 Fluid pack lot # [id number]  . CATARACT EXTRACTION W/PHACO Right 02/26/2016   Procedure: CATARACT EXTRACTION PHACO AND INTRAOCULAR LENS PLACEMENT (IOC);  Surgeon: Birder Robson, MD;  Location: [facility];  Service: Ophthalmology;  Laterality: Right;  [place] 57.4 AP% 24.0 CDE 13.75 Fluid Pack lot # [id number]  . CHOLECYSTECTOMY    . COLONOSCOPY WITH PROPOFOL N/A 12/18/2014   Procedure: COLONOSCOPY WITH PROPOFOL;  Surgeon: Manya Silvas, MD;  Location: [facility]  END[facility];  Service: Endoscopy;  Laterality: N/A;  . DEEP NECK LYMPH NODE BIOPSY / EXCISION    . JOINT REPLACEMENT    . KNEE ARTHROPLASTY Left 06/20/2015   Procedure: COMPUTER ASSISTED TOTAL KNEE ARTHROPLASTY;  Surgeon: Dereck Leep, MD;  Location: [facility];  Service: Orthopedics;  Laterality: Left;  . PTOSIS REPAIR Bilateral 04/14/2017   Procedure: PTOSIS REPAIR RESECT EX;  Surgeon: Karle Starch, MD;  Location: [facility];  Service: Ophthalmology;  Laterality: Bilateral;  sleep apnea  . TONSILLECTOMY    . TOTAL HIP ARTHROPLASTY Right 07/21/2018   Procedure: TOTAL HIP ARTHROPLASTY ANTERIOR APPROACH;  Surgeon: Gaynelle Arabian, MD;  Location: [facility];  Service: Orthopedics;  Laterality: Right;    There were no vitals filed for this visit.   Subjective Assessment - 12/14/19 0932    Subjective "I feel like my allergies are worse, my eyes are watering and it's harder to keep my voice clear"    Currently in Pain? No/denies                 ADULT SLP TREATMENT - 12/14/19 0937      General Information   Behavior/Cognition Alert;Cooperative;Pleasant mood      Treatment Provided   Treatment provided Cognitive-Linquistic      Cognitive-Linquistic Treatment   Treatment focused on Voice;Patient/family/caregiver education    Skilled Treatment Pt's daughter has told her  voice is better. SOVTE required occasional min A to complete accurately. Ongoing instruction to complete SOVTE twice a day and warm up before she talks. Min cues for breath support and volume to achieve clear phonatoin consistently. In conversation, pt required occasional min A to maintain clear phonation in convesation . Continues to notice slur on sibalants with fatigue      Assessment / Recommendations / Plan   Plan Continue with current plan of care      Progression Toward Goals   Progression toward goals Progressing toward goals              SLP Short Term Goals - 12/14/19 1006      SLP SHORT TERM  GOAL #1   Title Pt will complete HEP for labial weakness with mod I over 2 sessions    Time 2    Period Weeks    Status Achieved      SLP SHORT TERM GOAL #2   Title Pt will produce sibalant fricatives accurately at sentence level with rare min A 18/20 sentences    Time 2    Period Weeks    Status Achieved      SLP SHORT TERM GOAL #3   Title Pt will follow 3 reflux precautions over 3 sessions with mod I    Baseline ENT did not report reflux    Time 2    Period Weeks    Status Deferred      SLP SHORT TERM GOAL #4   Title Pt will follow 3 vocal hygiene strategies over 3 sessions with mod I    Baseline voice goals to be modified pending ENT consult    Time 1    Period Weeks    Status On-going      SLP SHORT TERM GOAL #5   Title pt to demo abdominal breathing at rest 75% success over 5 minutes    Time 3    Period Weeks    Status On-going      SLP SHORT TERM GOAL #6   Title pt to complete tasks mitigating muscle tension dysphonia with rare min A over three sessions    Time 2    Period Weeks    Status On-going            SLP Long Term Goals - 12/14/19 1007      SLP LONG TERM GOAL #1   Title Pt will produce sibalant fricatives accurately in simple conversation over 15 minutes, self corrected as needed, with occasional min A over 2 session    Time 4    Period Weeks    Status On-going      SLP LONG TERM GOAL #2   Title Pt will achieve WNL phonation using strategies 15/20 sentences with occasional min A    Baseline may be modified following ENT consult    Time 4    Period Weeks    Status On-going      SLP LONG TERM GOAL #3   Title Pt will follow 4 vocal hygiene strategies over 3 sessions with rare min A    Time 4    Period Weeks    Status On-going      SLP LONG TERM GOAL #4   Title Pt will improve Communicative Effectiveness Survey score by 2 points    Time 4    Period Weeks    Status On-going      SLP LONG TERM GOAL #5   Title pt to use  WNL voice quality in  8 minutes simple conversation x3 sessions    Time 4    Period Weeks    Status On-going      SLP LONG TERM GOAL #6   Title pt to demo PHorTE exercises with rare min A in 3 sessions    Time 7    Period Weeks    Status New            Plan - 12/14/19 1005    Clinical Impression Statement Ongoing training for HEP, 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 [name] [name] is achieving clear phonation in structured speech tasks and shorter spontaneous speech tasks (phrase/sentence level). However carryover for clear phonation and correct dysphonia at conversation level remains challenging at this time. Skilled ST remains necessary to habitualize WNL vocal quality.    Speech Therapy Frequency 2x / week    Duration --   8 weeks or 17 visits   Treatment/Interventions Aspiration precaution training;Environmental controls;Cueing hierarchy;Oral motor exercises;SLP instruction and feedback;Compensatory strategies;Functional tasks;Cognitive reorganization;Compensatory techniques;Diet toleration management by SLP;Internal/external aids;Multimodal communcation approach;Patient/family education    Potential to Achieve Goals Good           Patient will benefit from skilled therapeutic intervention in order to improve the following deficits and impairments:   Other voice and resonance disorders  Dysarthria and anarthria    Problem List Patient Active Problem List   Diagnosis Date Noted  . Abdominal wall hernia 10/30/2019  . Change in voice 10/30/2019  . History of stroke 09/14/2019  . Spinal stenosis 07/24/2019  . Mixed hyperlipidemia   . OSA (obstructive sleep apnea)   . Dysfunction of eustachian tube 05/15/2019  . Other social stressor 03/29/2019  . 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 Diarrhea 06/20/2018  . Joint pain 06/20/2018  . 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 Medicare annual wellness visit, subsequent 09/15/2017  . SUI (stress  urinary incontinence, female) 08/07/2017  . Back pain 06/16/2016  . Advance care planning 01/07/2016  . Sleep apnea   . 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 Allergic rhinitis 11/24/2014  . Adult BMI 30+ 11/24/2014  . Depression 11/24/2014  . Insomnia 08/23/2009  . Headache, migraine 05/10/2009  . HERPES SIMPLEX INFECTION 08/14/2006  . HYPERCHOLESTEROLEMIA 08/14/2006  . Anxiety state 08/14/2006  . Essential hypertension 08/14/2006  . HIATAL HERNIA 08/14/2006  . IRRITABLE BOWEL SYNDROME 08/14/2006  . 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 Primary osteoarthritis of right knee 08/14/2006  . PLANTAR FASCIITIS 08/14/2006    Lauralyn Shadowens, Annye Rusk MS, CCC-SLP 12/14/2019, 10:20 AM  [facility] [address] [place], [place], [address] Phone: [phone]   Fax:  [phone]   Name: [name] [name] MRN: [id number] Date of Birth: 11/27/1940

## 2019-12-14 NOTE — Patient Instructions (Signed)
   Do the bubbles hum and straw hums per your instructions at least twice a day   Gargle 10 sips if you are able  When you know you are going to talk for a while (out to eat, phone conversation) warm up your voice with the straw and bubble hums and accents

## 2019-12-16 ENCOUNTER — Ambulatory Visit: Payer: Medicare HMO

## 2019-12-16 ENCOUNTER — Other Ambulatory Visit: Payer: Self-pay

## 2019-12-16 DIAGNOSIS — R498 Other voice and resonance disorders: Secondary | ICD-10-CM | POA: Diagnosis not present

## 2019-12-16 DIAGNOSIS — R2681 Unsteadiness on feet: Secondary | ICD-10-CM

## 2019-12-16 DIAGNOSIS — M6281 Muscle weakness (generalized): Secondary | ICD-10-CM | POA: Diagnosis not present

## 2019-12-16 DIAGNOSIS — R278 Other lack of coordination: Secondary | ICD-10-CM | POA: Diagnosis not present

## 2019-12-16 DIAGNOSIS — R471 Dysarthria and anarthria: Secondary | ICD-10-CM | POA: Diagnosis not present

## 2019-12-16 DIAGNOSIS — R262 Difficulty in walking, not elsewhere classified: Secondary | ICD-10-CM

## 2019-12-16 DIAGNOSIS — R2689 Other abnormalities of gait and mobility: Secondary | ICD-10-CM | POA: Diagnosis not present

## 2019-12-16 NOTE — Patient Instructions (Signed)
° °  Do the exercises THREE TIMES A DAY. This will help your voice!

## 2019-12-16 NOTE — Therapy (Signed)
[facility] [address] [place], [place], [address] Phone: [phone]   Fax:  [phone]  Speech Language Pathology Treatment  Patient Details  Name: [name] [name] MRN: [id number] Date of Birth: [birth year] Referring Provider (SLP): Frann Rider, NP   Encounter Date: 12/16/2019   End of Session - 12/16/19 1451    Visit Number 9    Number of Visits 17    Date for SLP Re-Evaluation 12/28/19    SLP Start Time 2637    SLP Stop Time  8588    SLP Time Calculation (min) 42 min    Activity Tolerance Patient tolerated treatment well           Past Medical History:  Diagnosis Date   Arthritis    knees   Depression    Fatty liver    GERD (gastroesophageal reflux disease)    Hyperlipidemia    Hypertension    IBS (irritable bowel syndrome)    Obesity    Pneumonia    Sleep apnea    uses CPAP    Past Surgical History:  Procedure Laterality Date   BLADDER SURGERY     BREAST SURGERY     breast biopsy   BROW LIFT Bilateral 04/14/2017   Procedure: BLEPHAROPLASTY UPPER EYELID WITH EXCESS SKIN;  Surgeon: Karle Starch, MD;  Location: [facility];  Service: Ophthalmology;  Laterality: Bilateral;   CATARACT EXTRACTION W/PHACO Left 02/05/2016   Procedure: CATARACT EXTRACTION PHACO AND INTRAOCULAR LENS PLACEMENT ([place]);  Surgeon: Birder Robson, MD;  Location: [facility];  Service: Ophthalmology;  Laterality: Left;  [place] 01:10 AP% 22.3 CDE 15.67 Fluid pack lot # [id number]   CATARACT EXTRACTION W/PHACO Right 02/26/2016   Procedure: CATARACT EXTRACTION PHACO AND INTRAOCULAR LENS PLACEMENT (IOC);  Surgeon: Birder Robson, MD;  Location: [facility];  Service: Ophthalmology;  Laterality: Right;  [place] 57.4 AP% 24.0 CDE 13.75 Fluid Pack lot # [id number]   CHOLECYSTECTOMY     COLONOSCOPY WITH PROPOFOL N/A 12/18/2014   Procedure: COLONOSCOPY WITH PROPOFOL;  Surgeon: Manya Silvas, MD;  Location: [facility];  Service: Endoscopy;  Laterality: N/A;   DEEP NECK LYMPH NODE BIOPSY / EXCISION     JOINT REPLACEMENT     KNEE ARTHROPLASTY Left 06/20/2015   Procedure: COMPUTER ASSISTED TOTAL KNEE ARTHROPLASTY;  Surgeon: Dereck Leep, MD;  Location: [facility];  Service: Orthopedics;  Laterality: Left;   PTOSIS REPAIR Bilateral 04/14/2017   Procedure: PTOSIS REPAIR RESECT EX;  Surgeon: Karle Starch, MD;  Location: [facility];  Service: Ophthalmology;  Laterality: Bilateral;  sleep apnea   TONSILLECTOMY     TOTAL HIP ARTHROPLASTY Right 07/21/2018   Procedure: TOTAL HIP ARTHROPLASTY ANTERIOR APPROACH;  Surgeon: Gaynelle Arabian, MD;  Location: [facility];  Service: Orthopedics;  Laterality: Right;    There were no vitals filed for this visit.   Subjective Assessment - 12/16/19 1417    Subjective Pt thinks voice worse today due to allergies. "This will all be better when I stop coming in August."    Currently in Pain? No/denies                 ADULT SLP TREATMENT - 12/16/19 1418      General Information   Behavior/Cognition Alert;Cooperative;Pleasant mood      Treatment Provided   Treatment provided Cognitive-Linquistic      Cognitive-Linquistic Treatment   Treatment focused on Voice;Patient/family/caregiver education    Skilled Treatment Pt told SLP in previous  ST SLP told pt to do semi occluded exercises (SOVTE) x3/day. Pt reports she has not done this "maybe once a day". SLP strongly encourage pt to complete as directed. Pt talkative re: allergies today and why they are negatively affecting her voice. SLP worked through semi-occluded vocal tract exercise with pt requiring SBA. With simple conversation of 5-7 minutes, x3; pt req'd min A occasionally for WNL vocal behavior. Pt noted "weak" voice 70% of the time during these conversations.      Assessment / Recommendations / Plan   Plan Continue with current plan of care      Progression Toward Goals   Progression toward  goals Progressing toward goals            SLP Education - 12/16/19 1451    Education Details must do exercises TID    Person(s) Educated Patient    Methods Explanation    Comprehension Verbalized understanding            SLP Short Term Goals - 12/14/19 1006      SLP SHORT TERM GOAL #1   Title Pt will complete HEP for labial weakness with mod I over 2 sessions    Time 2    Period Weeks    Status Achieved      SLP SHORT TERM GOAL #2   Title Pt will produce sibalant fricatives accurately at sentence level with rare min A 18/20 sentences    Time 2    Period Weeks    Status Achieved      SLP SHORT TERM GOAL #3   Title Pt will follow 3 reflux precautions over 3 sessions with mod I    Baseline ENT did not report reflux    Time 2    Period Weeks    Status Deferred      SLP SHORT TERM GOAL #4   Title Pt will follow 3 vocal hygiene strategies over 3 sessions with mod I    Baseline voice goals to be modified pending ENT consult    Time 1    Period Weeks    Status On-going      SLP SHORT TERM GOAL #5   Title pt to demo abdominal breathing at rest 75% success over 5 minutes    Time 3    Period Weeks    Status On-going      SLP SHORT TERM GOAL #6   Title pt to complete tasks mitigating muscle tension dysphonia with rare min A over three sessions    Time 2    Period Weeks    Status On-going            SLP Long Term Goals - 12/16/19 1453      SLP LONG TERM GOAL #1   Title Pt will produce sibalant fricatives accurately in simple conversation over 15 minutes, self corrected as needed, with occasional min A over 2 session    Time 4    Period Weeks    Status On-going      SLP LONG TERM GOAL #2   Title Pt will achieve WNL phonation using strategies 15/20 sentences with occasional min A    Baseline may be modified following ENT consult    Time 4    Period Weeks    Status On-going      SLP LONG TERM GOAL #3   Title Pt will follow 4 vocal hygiene strategies over 3  sessions with rare min A    Time 4  Period Weeks    Status On-going      SLP LONG TERM GOAL #4   Title Pt will improve Communicative Effectiveness Survey score by 2 points    Time 4    Period Weeks    Status On-going      SLP LONG TERM GOAL #5   Title pt to use WNL voice quality in 8 minutes simple conversation x3 sessions    Time 4    Period Weeks    Status On-going      SLP LONG TERM GOAL #6   Title pt to demo PHorTE exercises with rare min A in 3 sessions    Time 7    Period Weeks    Status New            Plan - 12/16/19 1453    Clinical Impression Statement Ongoing training for HEP, 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 Pt has not been as compliant as SLP has prescribed with her HEP. [name] [name] is achieving clear phonation in structured speech tasks and shorter spontaneous speech tasks (phrase/sentence level). However carryover for clear phonation and correct dysphonia at conversation level remains challenging at this time. Skilled ST remains necessary to habitualize WNL vocal quality.    Speech Therapy Frequency 2x / week    Duration --   8 weeks or 17 visits   Treatment/Interventions Aspiration precaution training;Environmental controls;Cueing hierarchy;Oral motor exercises;SLP instruction and feedback;Compensatory strategies;Functional tasks;Cognitive reorganization;Compensatory techniques;Diet toleration management by SLP;Internal/external aids;Multimodal communcation approach;Patient/family education    Potential to Achieve Goals Good           Patient will benefit from skilled therapeutic intervention in order to improve the following deficits and impairments:   Other voice and resonance disorders  Dysarthria and anarthria    Problem List Patient Active Problem List   Diagnosis Date Noted   Abdominal wall hernia 10/30/2019   Change in voice 10/30/2019   History of stroke 09/14/2019   Spinal stenosis 07/24/2019   Mixed  hyperlipidemia    OSA (obstructive sleep apnea)    Dysfunction of eustachian tube 05/15/2019   Other social stressor 03/29/2019   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   Diarrhea 06/20/2018   Joint pain 06/20/2018   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   Medicare annual wellness visit, subsequent 09/15/2017   SUI (stress urinary incontinence, female) 08/07/2017   Back pain 06/16/2016   Advance care planning 01/07/2016   Sleep apnea    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   Allergic rhinitis 11/24/2014   Adult BMI 30+ 11/24/2014   Depression 11/24/2014   Insomnia 08/23/2009   Headache, migraine 05/10/2009   HERPES SIMPLEX INFECTION 08/14/2006   HYPERCHOLESTEROLEMIA 08/14/2006   Anxiety state 08/14/2006   Essential hypertension 08/14/2006   HIATAL HERNIA 08/14/2006   IRRITABLE BOWEL SYNDROME 08/14/2006   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   Primary osteoarthritis of right knee 08/14/2006   PLANTAR FASCIITIS 08/14/2006    [place] ,MS, CCC-SLP  12/16/2019, 2:54 PM  [facility] [address] [place], [place], [address] Phone: [phone]   Fax:  [phone]   Name: [name] [name] MRN: [id number] Date of Birth: 03-17-1941

## 2019-12-16 NOTE — Therapy (Signed)
[facility] [address] [place], [place], [address] Phone: [phone]   Fax:  [phone]  Physical Therapy Treatment  Patient Details  Name: [name] [name] MRN: [id number] Date of Birth: [birth year] Referring Provider (PT): Referred by Frann Rider, NP, PCP is Elsie Stain, MD    Encounter Date: 12/16/2019   PT End of Session - 12/16/19 1356    Visit Number 12    Number of Visits 18    Date for PT Re-Evaluation 02/12/20   updated POC for 6 weeks, cert for 60 days   Authorization Type Medicare (10th visit PN)    PT Start Time 1316    PT Stop Time 1358    PT Time Calculation (min) 42 min    Equipment Utilized During Treatment Gait belt    Activity Tolerance Patient tolerated treatment well    Behavior During Therapy [facility] for tasks assessed/performed           Past Medical History:  Diagnosis Date  . Arthritis    knees  . Depression   . Fatty liver   . GERD (gastroesophageal reflux disease)   . Hyperlipidemia   . Hypertension   . IBS (irritable bowel syndrome)   . Obesity   . Pneumonia   . Sleep apnea    uses CPAP    Past Surgical History:  Procedure Laterality Date  . BLADDER SURGERY    . BREAST SURGERY     breast biopsy  . BROW LIFT Bilateral 04/14/2017   Procedure: BLEPHAROPLASTY UPPER EYELID WITH EXCESS SKIN;  Surgeon: Karle Starch, MD;  Location: [place];  Service: Ophthalmology;  Laterality: Bilateral;  . CATARACT EXTRACTION W/PHACO Left 02/05/2016   Procedure: CATARACT EXTRACTION PHACO AND INTRAOCULAR LENS PLACEMENT (IOC);  Surgeon: Birder Robson, MD;  Location: [facility];  Service: Ophthalmology;  Laterality: Left;  [place] 01:10 AP% 22.3 CDE 15.67 Fluid pack lot # [id number]  . CATARACT EXTRACTION W/PHACO Right 02/26/2016   Procedure: CATARACT EXTRACTION PHACO AND INTRAOCULAR LENS PLACEMENT (IOC);  Surgeon: Birder Robson, MD;  Location: [facility];  Service: Ophthalmology;   Laterality: Right;  [place] 57.4 AP% 24.0 CDE 13.75 Fluid Pack lot # [id number]  . CHOLECYSTECTOMY    . COLONOSCOPY WITH PROPOFOL N/A 12/18/2014   Procedure: COLONOSCOPY WITH PROPOFOL;  Surgeon: Manya Silvas, MD;  Location: [facility];  Service: Endoscopy;  Laterality: N/A;  . DEEP NECK LYMPH NODE BIOPSY / EXCISION    . JOINT REPLACEMENT    . KNEE ARTHROPLASTY Left 06/20/2015   Procedure: COMPUTER ASSISTED TOTAL KNEE ARTHROPLASTY;  Surgeon: Dereck Leep, MD;  Location: [facility];  Service: Orthopedics;  Laterality: Left;  . PTOSIS REPAIR Bilateral 04/14/2017   Procedure: PTOSIS REPAIR RESECT EX;  Surgeon: Karle Starch, MD;  Location: [place];  Service: Ophthalmology;  Laterality: Bilateral;  sleep apnea  . TONSILLECTOMY    . TOTAL HIP ARTHROPLASTY Right 07/21/2018   Procedure: TOTAL HIP ARTHROPLASTY ANTERIOR APPROACH;  Surgeon: Gaynelle Arabian, MD;  Location: [facility];  Service: Orthopedics;  Laterality: Right;    There were no vitals filed for this visit.   Subjective Assessment - 12/16/19 1319    Subjective Patient reports still recovering from all the walking. Patient reports the stiffness is not as bad today.    Patient is accompained by: Family member   cousin   Pertinent History Obesity, hypertension, hyperlipidemia, Lt sciatic nerve pain, sleep apnea on CPAP.    Limitations Walking;Standing;Sitting  Patient Stated Goals Feel more like myself    Currently in Pain? No/denies                             [facility] Adult PT Treatment/Exercise - 12/16/19 0001      Ambulation/Gait   Ambulation/Gait Yes    Ambulation/Gait Assistance 5: Supervision    Ambulation/Gait Assistance Details completed gait training on outdoors surfaces at superivsion level, patient demo improved balance on grass surfaces at this time. Completed x 150 ft on grass, x 300 ft on paved surfaces. Patient having instances of toe scuffing toward end of ambulation distances, with PT  providing verbal cues for improved heel toe pattern.     Ambulation Distance (Feet) 450 Feet    Assistive device None    Gait Pattern Step-through pattern;Decreased arm swing - right;Decreased arm swing - left;Decreased dorsiflexion - right;Decreased dorsiflexion - left;Poor foot clearance - left;Ataxic    Ambulation Surface Level;Indoor;Unlevel;Outdoor;Paved      Neuro Re-ed    Neuro Re-ed Details  Completed obstacle course including negotiating/stepping over orange hurdles x 3, and walking over unlevel surfaces (blue mat) x 5 laps, down and back. Patient had two isntances of misstep with unlevel surfaces, requiring CGA from PT.                Balance Exercises - 12/16/19 0001      Balance Exercises: Standing   Tandem Stance Eyes open;Eyes closed;Foam/compliant surface;Intermittent upper extremity support    Tandem Stance Time completed on airex pad, initially with eyes open 1 x 30 seconds, progressed to 2 x 30 seconds, alternating foot position with each repetition.     SLS Eyes open;Solid surface;Intermittent upper extremity support;3 reps;Limitations    SLS Limitations patient able to hold 5-10 seconds with intermittent UE suppot for steadying, patient reports increased B knee pain with completion so limiting ability to complete SLS activities    Tandem Gait Forward;Retro;Intermittent upper extremity support;Foam/compliant surface    Tandem Gait Limitations performed on blue mat, forward and back x 4 laps with intermittent UE support as needed.     Sidestepping Foam/compliant support;3 reps    Sidestepping Limitations completed side stepping along blue balance beam x 4 laps, down and back in // bars, completed first lap with UE support, and progressed to no UE support.                PT Short Term Goals - 12/14/19 1247      PT SHORT TERM GOAL #1   Title Patient will demonstrate ability to ambulate >200 ft on grass surfaces with LRAD at Supervision level    Baseline  supervision, <100 ft    Time 3    Period Weeks    Status New    Target Date 01/04/20      PT SHORT TERM GOAL #2   Target Date 11/23/19             PT Long Term Goals - 12/14/19 0809      PT LONG TERM GOAL #1   Title Pt will be independent and demo compliance with Final balance/strengthening HEP    Baseline Continues to update HEP at this time    Time 6    Period Weeks    Status Revised    Target Date 01/25/20      PT LONG TERM GOAL #2   Title --    Baseline --    Time --  Period --    Status --      PT LONG TERM GOAL #3   Title Patient will improve FGA to >/= 24/30 to demonstrate reduced risk for falls    Baseline 17/30, 22/30 on 12/14/19    Time 6    Period Weeks    Status On-going    Target Date 01/25/20      PT LONG TERM GOAL #4   Title Patient will demonstrate ability to complete TUG <9 secs w/o AD to demonstrate improved mobility    Baseline 10.97, 9.69 secs on 12/14/19    Time 6    Period Weeks    Status On-going    Target Date 01/25/20      PT LONG TERM GOAL #5   Title Patient will demonstrate ability to ambulate > 200 ft on grass surfaces, Mod I, to demonstrate improved ability to ambulate in yard    Baseline supervision on grass, <100 ft    Time 6    Period Weeks    Status New    Target Date 01/25/20                 Plan - 12/16/19 1446    Clinical Impression Statement Completed gait training on outdoor surfaces, including grass and pavement. Patient demo improved ability to ambulate on unlevel surfaces with less assistance today. Continued balnace exercises as tolerated by patient, with patient demo improved balance with tandem stance. Patient will continue to benefit from skilled PT services to progress toward goals.    Personal Factors and Comorbidities Comorbidity 3+    Comorbidities Obesity, hypertension, hyperlipidemia, Lt sciatic nerve pain, sleep apnea on CPAP    Examination-Activity Limitations Squat;Transfers;Stairs;Stand     Examination-Participation Restrictions Community Activity;Cleaning    Stability/Clinical Decision Making Evolving/Moderate complexity    Rehab Potential Good    PT Frequency 2x / week    PT Duration 2 weeks   followed by 1x/week for 4 weeks   PT Treatment/Interventions ADLs/Self Care Home Management;Moist Heat;Cryotherapy;Electrical Stimulation;DME Instruction;Gait training;Stair training;Functional mobility training;Therapeutic activities;Therapeutic exercise;Balance training;Neuromuscular re-education;Patient/family education;Orthotic Fit/Training;Passive range of motion    PT Next Visit Plan Continue dynamic balance, functional strengthening, corner balance with eyes closed/head motions. Update HEP    Consulted and Agree with Plan of Care Patient           Patient will benefit from skilled therapeutic intervention in order to improve the following deficits and impairments:  Abnormal gait, Decreased balance, Decreased endurance, Difficulty walking, Decreased activity tolerance, Decreased safety awareness, Pain, Postural dysfunction, Decreased strength  Visit Diagnosis: Muscle weakness (generalized)  Unsteadiness on feet  Other abnormalities of gait and mobility  Difficulty in walking, not elsewhere classified     Problem List Patient Active Problem List   Diagnosis Date Noted  . Abdominal wall hernia 10/30/2019  . Change in voice 10/30/2019  . History of stroke 09/14/2019  . Spinal stenosis 07/24/2019  . Mixed hyperlipidemia   . OSA (obstructive sleep apnea)   . Dysfunction of eustachian tube 05/15/2019  . Other social stressor 03/29/2019  . 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 Diarrhea 06/20/2018  . Joint pain 06/20/2018  . 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 Medicare annual wellness visit, subsequent 09/15/2017  . SUI (stress urinary incontinence, female) 08/07/2017  . Back pain 06/16/2016  . Advance care planning 01/07/2016  .  Sleep apnea   . 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 Allergic rhinitis 11/24/2014  .  Adult BMI 30+ 11/24/2014  . Depression 11/24/2014  . Insomnia 08/23/2009  . Headache, migraine 05/10/2009  . HERPES SIMPLEX INFECTION 08/14/2006  . HYPERCHOLESTEROLEMIA 08/14/2006  . Anxiety state 08/14/2006  . Essential hypertension 08/14/2006  . HIATAL HERNIA 08/14/2006  . IRRITABLE BOWEL SYNDROME 08/14/2006  . 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 Primary osteoarthritis of right knee 08/14/2006  . PLANTAR FASCIITIS 08/14/2006    Jones Bales, PT, DPT 12/16/2019, 2:47 PM  [facility] [address] [place], [place], [address] Phone: [phone]   Fax:  [phone]  Name: [name] [name] MRN: [id number] Date of Birth: May 23, 1941

## 2019-12-21 ENCOUNTER — Ambulatory Visit: Payer: Medicare HMO

## 2019-12-21 ENCOUNTER — Other Ambulatory Visit: Payer: Self-pay

## 2019-12-21 DIAGNOSIS — R262 Difficulty in walking, not elsewhere classified: Secondary | ICD-10-CM | POA: Diagnosis not present

## 2019-12-21 DIAGNOSIS — M6281 Muscle weakness (generalized): Secondary | ICD-10-CM | POA: Diagnosis not present

## 2019-12-21 DIAGNOSIS — R2681 Unsteadiness on feet: Secondary | ICD-10-CM

## 2019-12-21 DIAGNOSIS — R498 Other voice and resonance disorders: Secondary | ICD-10-CM | POA: Diagnosis not present

## 2019-12-21 DIAGNOSIS — R2689 Other abnormalities of gait and mobility: Secondary | ICD-10-CM | POA: Diagnosis not present

## 2019-12-21 DIAGNOSIS — R471 Dysarthria and anarthria: Secondary | ICD-10-CM

## 2019-12-21 DIAGNOSIS — R278 Other lack of coordination: Secondary | ICD-10-CM | POA: Diagnosis not present

## 2019-12-21 NOTE — Therapy (Signed)
[facility] [address] [place], [place], [address] Phone: [phone]   Fax:  [phone]  Speech Language Pathology Treatment/progress note  Patient Details  Name: [name] [name] MRN: [id number] Date of Birth: 02-07-1941 Referring Provider (SLP): Frann Rider, NP   Encounter Date: 12/21/2019   End of Session - 12/21/19 0932    Visit Number 10    Number of Visits 17    Date for SLP Re-Evaluation 12/28/19    SLP Start Time 0849    SLP Stop Time  0930    SLP Time Calculation (min) 41 min    Activity Tolerance Patient tolerated treatment well           Past Medical History:  Diagnosis Date  . Arthritis    knees  . Depression   . Fatty liver   . GERD (gastroesophageal reflux disease)   . Hyperlipidemia   . Hypertension   . IBS (irritable bowel syndrome)   . Obesity   . Pneumonia   . Sleep apnea    uses CPAP    Past Surgical History:  Procedure Laterality Date  . BLADDER SURGERY    . BREAST SURGERY     breast biopsy  . BROW LIFT Bilateral 04/14/2017   Procedure: BLEPHAROPLASTY UPPER EYELID WITH EXCESS SKIN;  Surgeon: Karle Starch, MD;  Location: [place];  Service: Ophthalmology;  Laterality: Bilateral;  . CATARACT EXTRACTION W/PHACO Left 02/05/2016   Procedure: CATARACT EXTRACTION PHACO AND INTRAOCULAR LENS PLACEMENT (IOC);  Surgeon: Birder Robson, MD;  Location: [facility];  Service: Ophthalmology;  Laterality: Left;  [place] 01:10 AP% 22.3 CDE 15.67 Fluid pack lot # [id number]  . CATARACT EXTRACTION W/PHACO Right 02/26/2016   Procedure: CATARACT EXTRACTION PHACO AND INTRAOCULAR LENS PLACEMENT (IOC);  Surgeon: Birder Robson, MD;  Location: [facility];  Service: Ophthalmology;  Laterality: Right;  [place] 57.4 AP% 24.0 CDE 13.75 Fluid Pack lot # [id number]  . CHOLECYSTECTOMY    . COLONOSCOPY WITH PROPOFOL N/A 12/18/2014   Procedure: COLONOSCOPY WITH PROPOFOL;  Surgeon: Manya Silvas, MD;   Location[facility];  Service: Endoscopy;  Laterality: N/A;  . DEEP NECK LYMPH NODE BIOPSY / EXCISION    . JOINT REPLACEMENT    . KNEE ARTHROPLASTY Left 06/20/2015   Procedure: COMPUTER ASSISTED TOTAL KNEE ARTHROPLASTY;  Surgeon: Dereck Leep, MD;  Location: [facility];  Service: Orthopedics;  Laterality: Left;  . PTOSIS REPAIR Bilateral 04/14/2017   Procedure: PTOSIS REPAIR RESECT EX;  Surgeon: Karle Starch, MD;  Location: [place];  Service: Ophthalmology;  Laterality: Bilateral;  sleep apnea  . TONSILLECTOMY    . TOTAL HIP ARTHROPLASTY Right 07/21/2018   Procedure: TOTAL HIP ARTHROPLASTY ANTERIOR APPROACH;  Surgeon: Gaynelle Arabian, MD;  Location: [facility];  Service: Orthopedics;  Laterality: Right;    There were no vitals filed for this visit.   Subjective Assessment - 12/21/19 0901    Subjective "Pt thinks voice worse today due to allergies."    Currently in Pain? No/denies                 ADULT SLP TREATMENT - 12/21/19 0904      General Information   Behavior/Cognition Alert;Cooperative;Pleasant mood      Treatment Provided   Treatment provided Cognitive-Linquistic      Cognitive-Linquistic Treatment   Treatment focused on Voice;Patient/family/caregiver education    Skilled Treatment "Most days, I did (the homework) for 'gargling' and the phrases. But I tell you ,  I listen to my friends and they all sound hoarse like me."  SLP assessed pt's voice in simple conversation and 40- 50% of the time pt had voice WNL quality. SLP had pt practice abdominal breathing (AB) with the patient and pt req'd initial min cues but was indpendenct after 25 seconds practice. In sentence completion pt maintained WNL quality of voice 95%, and in sentence responses 88% of the time. Pt was given homework for sentence and 2-sentence responses and encouraged to practice at LEAST 20-25 minutes/day.       Assessment / Recommendations / Plan   Plan Continue with current plan of care        Progression Toward Goals   Progression toward goals Progressing toward goals              SLP Short Term Goals - 12/14/19 1006      SLP SHORT TERM GOAL #1   Title Pt will complete HEP for labial weakness with mod I over 2 sessions    Time 2    Period Weeks    Status Achieved      SLP SHORT TERM GOAL #2   Title Pt will produce sibalant fricatives accurately at sentence level with rare min A 18/20 sentences    Time 2    Period Weeks    Status Achieved      SLP SHORT TERM GOAL #3   Title Pt will follow 3 reflux precautions over 3 sessions with mod I    Baseline ENT did not report reflux    Time 2    Period Weeks    Status Deferred      SLP SHORT TERM GOAL #4   Title Pt will follow 3 vocal hygiene strategies over 3 sessions with mod I    Baseline voice goals to be modified pending ENT consult    Time 1    Period Weeks    Status On-going      SLP SHORT TERM GOAL #5   Title pt to demo abdominal breathing at rest 75% success over 5 minutes    Time 3    Period Weeks    Status On-going      SLP SHORT TERM GOAL #6   Title pt to complete tasks mitigating muscle tension dysphonia with rare min A over three sessions    Time 2    Period Weeks    Status On-going            SLP Long Term Goals - 12/21/19 0933      SLP LONG TERM GOAL #1   Title Pt will produce sibalant fricatives accurately in simple conversation over 15 minutes, self corrected as needed, with occasional min A over 2 session    Time 3    Period Weeks    Status On-going      SLP LONG TERM GOAL #2   Title Pt will achieve WNL phonation using strategies 15/20 sentences with occasional min A    Baseline may be modified following ENT consult    Time 3    Period Weeks    Status On-going      SLP LONG TERM GOAL #3   Title Pt will follow 4 vocal hygiene strategies over 3 sessions with rare min A    Time 3    Period Weeks    Status On-going      SLP LONG TERM GOAL #4   Title Pt will improve  Communicative Effectiveness Survey score by  2 points    Time 3    Period Weeks    Status On-going      SLP LONG TERM GOAL #5   Title pt to use WNL voice quality in 8 minutes simple conversation x3 sessions    Time 3    Period Weeks    Status On-going      SLP LONG TERM GOAL #6   Title pt to demo PHorTE exercises with rare min A in 3 sessions    Time 6    Period Weeks    Status On-going            Plan - 12/21/19 4496    Clinical Impression Statement Ongoing training for HEP, 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 Pt has not been as compliant as SLP has prescribed with her HEP. [name] [name] is achieving clear phonation in structured speech tasks and shorter spontaneous speech tasks (phrase/sentence level). However carryover for clear phonation and correct dysphonia at conversation level remains challenging at this time. Skilled ST remains necessary to habitualize WNL vocal quality.    Speech Therapy Frequency 2x / week    Duration --   8 weeks or 17 visits   Treatment/Interventions Aspiration precaution training;Environmental controls;Cueing hierarchy;Oral motor exercises;SLP instruction and feedback;Compensatory strategies;Functional tasks;Cognitive reorganization;Compensatory techniques;Diet toleration management by SLP;Internal/external aids;Multimodal communcation approach;Patient/family education    Potential to Achieve Goals Good           Patient will benefit from skilled therapeutic intervention in order to improve the following deficits and impairments:   Other voice and resonance disorders  Dysarthria and anarthria   Speech Therapy Progress Note  Dates of Reporting Period: 11-02-19 to present  Objective Reports of Subjective Statement: Pt has been seen for 10 ST sessions focusing on voice and dysarthria.  Objective Measurements: Pt 's vocal quality has improved in reading and simple linguistic tasks  Goal Update: See above  Plan: SEe  above  Reason Skilled Services are Required: Pt will need to habitualize proper voice quality in more difficult linguistic levels.   Problem List Patient Active Problem List   Diagnosis Date Noted  . Abdominal wall hernia 10/30/2019  . Change in voice 10/30/2019  . History of stroke 09/14/2019  . Spinal stenosis 07/24/2019  . Mixed hyperlipidemia   . OSA (obstructive sleep apnea)   . Dysfunction of eustachian tube 05/15/2019  . Other social stressor 03/29/2019  . 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 Diarrhea 06/20/2018  . Joint pain 06/20/2018  . 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 Medicare annual wellness visit, subsequent 09/15/2017  . SUI (stress urinary incontinence, female) 08/07/2017  . Back pain 06/16/2016  . Advance care planning 01/07/2016  . Sleep apnea   . 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 Allergic rhinitis 11/24/2014  . Adult BMI 30+ 11/24/2014  . Depression 11/24/2014  . Insomnia 08/23/2009  . Headache, migraine 05/10/2009  . HERPES SIMPLEX INFECTION 08/14/2006  . HYPERCHOLESTEROLEMIA 08/14/2006  . Anxiety state 08/14/2006  . Essential hypertension 08/14/2006  . HIATAL HERNIA 08/14/2006  . IRRITABLE BOWEL SYNDROME 08/14/2006  . 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 Primary osteoarthritis of right knee 08/14/2006  . PLANTAR FASCIITIS 08/14/2006    [facility] ,[place], CCC-SLP  12/21/2019, 9:34 AM  [facility] [address] [place], [place], [address] Phone: [phone]   Fax:  [phone]   Name: [name] [name] MRN: [id number] Date of Birth: [birth year]

## 2019-12-21 NOTE — Therapy (Signed)
[facility] [address] [place], [place], [address] Phone: [phone]   Fax:  [phone]  Physical Therapy Treatment  Patient Details  Name: [name] [name] MRN: [id number] Date of Birth: 1940/08/27 Referring Provider (PT): Referred by Frann Rider, NP, PCP is Elsie Stain, MD    Encounter Date: 12/21/2019   PT End of Session - 12/21/19 0807    Visit Number 13    Number of Visits 18    Date for PT Re-Evaluation 02/12/20   updated POC for 6 weeks, cert for 60 days   Authorization Type Medicare (10th visit PN)    PT Start Time 0800    PT Stop Time 0843    PT Time Calculation (min) 43 min    Equipment Utilized During Treatment Gait belt    Activity Tolerance Patient tolerated treatment well    Behavior During Therapy [facility] for tasks assessed/performed           Past Medical History:  Diagnosis Date  . Arthritis    knees  . Depression   . Fatty liver   . GERD (gastroesophageal reflux disease)   . Hyperlipidemia   . Hypertension   . IBS (irritable bowel syndrome)   . Obesity   . Pneumonia   . Sleep apnea    uses CPAP    Past Surgical History:  Procedure Laterality Date  . BLADDER SURGERY    . BREAST SURGERY     breast biopsy  . BROW LIFT Bilateral 04/14/2017   Procedure: BLEPHAROPLASTY UPPER EYELID WITH EXCESS SKIN;  Surgeon: Karle Starch, MD;  Location: [facility];  Service: Ophthalmology;  Laterality: Bilateral;  . CATARACT EXTRACTION W/PHACO Left 02/05/2016   Procedure: CATARACT EXTRACTION PHACO AND INTRAOCULAR LENS PLACEMENT (IOC);  Surgeon: Birder Robson, MD;  Location: [facility];  Service: Ophthalmology;  Laterality: Left;  [place] 01:10 AP% 22.3 CDE 15.67 Fluid pack lot # [id number]  . CATARACT EXTRACTION W/PHACO Right 02/26/2016   Procedure: CATARACT EXTRACTION PHACO AND INTRAOCULAR LENS PLACEMENT (IOC);  Surgeon: Birder Robson, MD;  Location: [facility];  Service: Ophthalmology;   Laterality: Right;  [place] 57.4 AP% 24.0 CDE 13.75 Fluid Pack lot # [id number]  . CHOLECYSTECTOMY    . COLONOSCOPY WITH PROPOFOL N/A 12/18/2014   Procedure: COLONOSCOPY WITH PROPOFOL;  Surgeon: Manya Silvas, MD;  Location: [facility];  Service: Endoscopy;  Laterality: N/A;  . DEEP NECK LYMPH NODE BIOPSY / EXCISION    . JOINT REPLACEMENT    . KNEE ARTHROPLASTY Left 06/20/2015   Procedure: COMPUTER ASSISTED TOTAL KNEE ARTHROPLASTY;  Surgeon: Dereck Leep, MD;  Location: [facility];  Service: Orthopedics;  Laterality: Left;  . PTOSIS REPAIR Bilateral 04/14/2017   Procedure: PTOSIS REPAIR RESECT EX;  Surgeon: Karle Starch, MD;  Location: [place];  Service: Ophthalmology;  Laterality: Bilateral;  sleep apnea  . TONSILLECTOMY    . TOTAL HIP ARTHROPLASTY Right 07/21/2018   Procedure: TOTAL HIP ARTHROPLASTY ANTERIOR APPROACH;  Surgeon: Gaynelle Arabian, MD;  Location: [facility];  Service: Orthopedics;  Laterality: Right;    There were no vitals filed for this visit.   Subjective Assessment - 12/21/19 0804    Subjective Patient reports tried walking on her grass and felt unstable due to her yard being very uneven. Mild stiffness this morning.    Patient is accompained by: Family member   cousin   Pertinent History Obesity, hypertension, hyperlipidemia, Lt sciatic nerve pain, sleep apnea on CPAP.  Limitations Walking;Standing;Sitting    Patient Stated Goals Feel more like myself    Currently in Pain? No/denies                             [facility] Adult PT Treatment/Exercise - 12/21/19 0001      Ambulation/Gait   Ambulation/Gait Yes    Ambulation/Gait Assistance 5: Supervision    Ambulation/Gait Assistance Details completed ambulation with activities    Assistive device None    Gait Pattern Step-through pattern;Decreased arm swing - right;Decreased arm swing - left;Decreased dorsiflexion - right;Decreased dorsiflexion - left;Poor foot clearance - left;Ataxic     Ambulation Surface Level;Indoor      Self-Care   Self-Care Other Self-Care Comments    Other Self-Care Comments  Due to patient reports of continued difficulty over unlevel surfaces, PT educating on potential using of walking/hiking stick on ourdoor unlevel surfaces (including grass/gravel) for improved stability.               Balance Exercises - 12/21/19 0001      Balance Exercises: Standing   Standing Eyes Closed Narrow base of support (BOS);Head turns;Foam/compliant surface;Limitations    Standing Eyes Closed Limitations completed 2 x 10 reps of horizontal/vertical head turns with EC on airex.     Tandem Stance Eyes open;Foam/compliant surface;Intermittent upper extremity support;3 reps;30 secs;Limitations    Tandem Stance Time completed on airex pad with eyes open, focused on holding full 30 seconds. alternating postion of BLE, increased difficutly with RLE positioned posterior.     Rockerboard Anterior/posterior;Lateral;EO;EC;Limitations    Rockerboard Limitations completed with EO x 2 reps with board positoned ant/post and lateral. progressed to EC x 3 reps with board postioned ant/post and lateral. Patient demo inability to hold for >10 seconds with eyes closed with board positioned laterally.     Tandem Gait Forward;Intermittent upper extremity support;Foam/compliant surface;4 reps;Limitations    Tandem Gait Limitations performed on blue balance beam, completed x 4 laps down and back in // bars.    Other Standing Exercises Standing on Rockerboard (postioned ant/post) completed alternating toe taps to cones, 1 x 15 reps alternating BLE. Completed with single UE support, attempted without UE support with patient demo dififculty today. In // bars, completed tandem gait with horizontal head turns, completed head turn with every two steps of tandem walking. Completed x 3 laps down and back with light BUE support.                PT Short Term Goals - 12/14/19 1247      PT SHORT  TERM GOAL #1   Title Patient will demonstrate ability to ambulate >200 ft on grass surfaces with LRAD at Supervision level    Baseline supervision, <100 ft    Time 3    Period Weeks    Status New    Target Date 01/04/20      PT SHORT TERM GOAL #2   Target Date 11/23/19             PT Long Term Goals - 12/14/19 0809      PT LONG TERM GOAL #1   Title Pt will be independent and demo compliance with Final balance/strengthening HEP    Baseline Continues to update HEP at this time    Time 6    Period Weeks    Status Revised    Target Date 01/25/20      PT LONG TERM GOAL #2  Title --    Baseline --    Time --    Period --    Status --      PT LONG TERM GOAL #3   Title Patient will improve FGA to >/= 24/30 to demonstrate reduced risk for falls    Baseline 17/30, 22/30 on 12/14/19    Time 6    Period Weeks    Status On-going    Target Date 01/25/20      PT LONG TERM GOAL #4   Title Patient will demonstrate ability to complete TUG <9 secs w/o AD to demonstrate improved mobility    Baseline 10.97, 9.69 secs on 12/14/19    Time 6    Period Weeks    Status On-going    Target Date 01/25/20      PT LONG TERM GOAL #5   Title Patient will demonstrate ability to ambulate > 200 ft on grass surfaces, Mod I, to demonstrate improved ability to ambulate in yard    Baseline supervision on grass, <100 ft    Time 6    Period Weeks    Status New    Target Date 01/25/20                 Plan - 12/21/19 1019    Clinical Impression Statement Continued progression of balance actvities as tolerated by patient. Also educated on potential use of walking stick on unlevel surfaces such as patient's yard.    Personal Factors and Comorbidities Comorbidity 3+    Comorbidities Obesity, hypertension, hyperlipidemia, Lt sciatic nerve pain, sleep apnea on CPAP    Examination-Activity Limitations Squat;Transfers;Stairs;Stand    Examination-Participation Restrictions Community  Activity;Cleaning    Stability/Clinical Decision Making Evolving/Moderate complexity    Rehab Potential Good    PT Frequency 2x / week    PT Duration 2 weeks   followed by 1x/week for 4 weeks   PT Treatment/Interventions ADLs/Self Care Home Management;Moist Heat;Cryotherapy;Electrical Stimulation;DME Instruction;Gait training;Stair training;Functional mobility training;Therapeutic activities;Therapeutic exercise;Balance training;Neuromuscular re-education;Patient/family education;Orthotic Fit/Training;Passive range of motion    PT Next Visit Plan Continue dynamic balance, functional strengthening, corner balance with eyes closed/head motions. Update HEP    Consulted and Agree with Plan of Care Patient           Patient will benefit from skilled therapeutic intervention in order to improve the following deficits and impairments:  Abnormal gait, Decreased balance, Decreased endurance, Difficulty walking, Decreased activity tolerance, Decreased safety awareness, Pain, Postural dysfunction, Decreased strength  Visit Diagnosis: Muscle weakness (generalized)  Unsteadiness on feet  Other abnormalities of gait and mobility  Difficulty in walking, not elsewhere classified     Problem List Patient Active Problem List   Diagnosis Date Noted  . Abdominal wall hernia 10/30/2019  . Change in voice 10/30/2019  . History of stroke 09/14/2019  . Spinal stenosis 07/24/2019  . Mixed hyperlipidemia   . OSA (obstructive sleep apnea)   . Dysfunction of eustachian tube 05/15/2019  . Other social stressor 03/29/2019  . 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 Diarrhea 06/20/2018  . Joint pain 06/20/2018  . 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 Medicare annual wellness visit, subsequent 09/15/2017  . SUI (stress urinary incontinence, female) 08/07/2017  . Back pain 06/16/2016  . Advance care planning 01/07/2016  . Sleep apnea   . 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 Allergic rhinitis 11/24/2014  . Adult BMI 30+ 11/24/2014  . Depression 11/24/2014  . Insomnia  08/23/2009  . Headache, migraine 05/10/2009  . HERPES SIMPLEX INFECTION 08/14/2006  . HYPERCHOLESTEROLEMIA 08/14/2006  . Anxiety state 08/14/2006  . Essential hypertension 08/14/2006  . HIATAL HERNIA 08/14/2006  . IRRITABLE BOWEL SYNDROME 08/14/2006  . 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 Primary osteoarthritis of right knee 08/14/2006  . PLANTAR FASCIITIS 08/14/2006    Jones Bales, PT, DPT 12/21/2019, 10:21 AM  [facility] [address] [place], [place], [address] Phone: [phone]   Fax:  [phone]  Name: [name] [name] MRN: [id number] Date of Birth: [birth year]

## 2019-12-23 ENCOUNTER — Ambulatory Visit: Payer: Medicare HMO

## 2019-12-26 ENCOUNTER — Other Ambulatory Visit: Payer: Self-pay | Admitting: Family Medicine

## 2019-12-26 DIAGNOSIS — I10 Essential (primary) hypertension: Secondary | ICD-10-CM

## 2019-12-28 ENCOUNTER — Other Ambulatory Visit: Payer: Self-pay

## 2019-12-28 ENCOUNTER — Ambulatory Visit: Payer: Medicare HMO | Attending: Family Medicine | Admitting: Occupational Therapy

## 2019-12-28 ENCOUNTER — Ambulatory Visit: Payer: Medicare HMO

## 2019-12-28 DIAGNOSIS — R2681 Unsteadiness on feet: Secondary | ICD-10-CM

## 2019-12-28 DIAGNOSIS — R278 Other lack of coordination: Secondary | ICD-10-CM | POA: Insufficient documentation

## 2019-12-28 DIAGNOSIS — R2689 Other abnormalities of gait and mobility: Secondary | ICD-10-CM

## 2019-12-28 DIAGNOSIS — R262 Difficulty in walking, not elsewhere classified: Secondary | ICD-10-CM | POA: Insufficient documentation

## 2019-12-28 DIAGNOSIS — M6281 Muscle weakness (generalized): Secondary | ICD-10-CM

## 2019-12-28 DIAGNOSIS — R498 Other voice and resonance disorders: Secondary | ICD-10-CM | POA: Diagnosis not present

## 2019-12-28 DIAGNOSIS — R471 Dysarthria and anarthria: Secondary | ICD-10-CM | POA: Insufficient documentation

## 2019-12-28 NOTE — Therapy (Signed)
[facility] [address] [place], [place], [address] Phone: [phone]   Fax:  [phone]  Physical Therapy Treatment  Patient Details  Name: [name] [name] MRN: [id number] Date of Birth: 1940-12-24 Referring Provider (PT): Referred by Frann Rider, NP, PCP is Elsie Stain, MD    Encounter Date: 12/28/2019   PT End of Session - 12/28/19 1149    Visit Number 14    Number of Visits 18    Date for PT Re-Evaluation 02/12/20   updated POC for 6 weeks, cert for 60 days   Authorization Type Medicare (10th visit PN)    PT Start Time 1145    PT Stop Time 1230    PT Time Calculation (min) 45 min    Equipment Utilized During Treatment Gait belt    Activity Tolerance Patient tolerated treatment well    Behavior During Therapy [facility] for tasks assessed/performed           Past Medical History:  Diagnosis Date  . Arthritis    knees  . Depression   . Fatty liver   . GERD (gastroesophageal reflux disease)   . Hyperlipidemia   . Hypertension   . IBS (irritable bowel syndrome)   . Obesity   . Pneumonia   . Sleep apnea    uses CPAP    Past Surgical History:  Procedure Laterality Date  . BLADDER SURGERY    . BREAST SURGERY     breast biopsy  . BROW LIFT Bilateral 04/14/2017   Procedure: BLEPHAROPLASTY UPPER EYELID WITH EXCESS SKIN;  Surgeon: Karle Starch, MD;  Location: [facility];  Service: Ophthalmology;  Laterality: Bilateral;  . CATARACT EXTRACTION W/PHACO Left 02/05/2016   Procedure: CATARACT EXTRACTION PHACO AND INTRAOCULAR LENS PLACEMENT (IOC);  Surgeon: Birder Robson, MD;  Location: [facility];  Service: Ophthalmology;  Laterality: Left;  [place] 01:10 AP% 22.3 CDE 15.67 Fluid pack lot # [id number]  . CATARACT EXTRACTION W/PHACO Right 02/26/2016   Procedure: CATARACT EXTRACTION PHACO AND INTRAOCULAR LENS PLACEMENT (IOC);  Surgeon: Birder Robson, MD;  Location: [facility];  Service: Ophthalmology;   Laterality: Right;  [place] 57.4 AP% 24.0 CDE 13.75 Fluid Pack lot # [id number]  . CHOLECYSTECTOMY    . COLONOSCOPY WITH PROPOFOL N/A 12/18/2014   Procedure: COLONOSCOPY WITH PROPOFOL;  Surgeon: Manya Silvas, MD;  Location: [facility];  Service: Endoscopy;  Laterality: N/A;  . DEEP NECK LYMPH NODE BIOPSY / EXCISION    . JOINT REPLACEMENT    . KNEE ARTHROPLASTY Left 06/20/2015   Procedure: COMPUTER ASSISTED TOTAL KNEE ARTHROPLASTY;  Surgeon: Dereck Leep, MD;  Location: [facility];  Service: Orthopedics;  Laterality: Left;  . PTOSIS REPAIR Bilateral 04/14/2017   Procedure: PTOSIS REPAIR RESECT EX;  Surgeon: Karle Starch, MD;  Location: [place];  Service: Ophthalmology;  Laterality: Bilateral;  sleep apnea  . TONSILLECTOMY    . TOTAL HIP ARTHROPLASTY Right 07/21/2018   Procedure: TOTAL HIP ARTHROPLASTY ANTERIOR APPROACH;  Surgeon: Gaynelle Arabian, MD;  Location: [facility];  Service: Orthopedics;  Laterality: Right;    There were no vitals filed for this visit.   Subjective Assessment - 12/28/19 1150    Subjective Patient reports did her exercises this morning and they went well. Patient reports increased stiffness waking up in the morning makes it difficult to get up and get going.    Patient is accompained by: Family member   cousin   Pertinent History Obesity, hypertension, hyperlipidemia, Lt sciatic nerve  pain, sleep apnea on CPAP.    Limitations Walking;Standing;Sitting    Patient Stated Goals Feel more like myself    Currently in Pain? No/denies                             [facility] Adult PT Treatment/Exercise - 12/28/19 0001      Ambulation/Gait   Ambulation/Gait Yes    Ambulation/Gait Assistance 5: Supervision    Ambulation/Gait Assistance Details completed gait with activites during session    Assistive device None    Gait Pattern Step-through pattern;Decreased arm swing - right;Decreased arm swing - left;Decreased dorsiflexion - right;Decreased  dorsiflexion - left;Poor foot clearance - left;Ataxic    Ambulation Surface Level;Indoor      High Level Balance   High Level Balance Activities Backward walking    High Level Balance Comments Complted backwards walking 3 x 30' with PT providing verbal cues for improved step length.       Exercises   Other Exercises  Due to patient reports of increased back/hip tightness upon waking and limiting ability "to get going". Completed supine stretches and educating on completing in the morning prior to getting out of bed.            Access Code: [email or web address] URL: [email or web address] Date: 12/28/2019 Prepared by: Baldomero Lamy  Completed and added the following new additions to HEP:  Hooklying Single Knee to Chest Stretch - 1 x daily - 7 x weekly - 1 sets - 3 reps - 30 hold Supine Lower Trunk Rotation - 1 x daily - 7 x weekly - 1 sets - 10 reps - 15 seconds hold Supine Figure 4 Piriformis Stretch - 1 x daily - 7 x weekly - 1 sets - 3 reps - 30 hold      Balance Exercises - 12/28/19 0001      Balance Exercises: Standing   Standing Eyes Closed Narrow base of support (BOS);3 reps;30 secs;Limitations    Standing Eyes Closed Limitations completed standing on balance beam, 3 x30 seconds with eyes closed focused on holding steady.     Rockerboard Anterior/posterior;Lateral;EC;30 seconds;Limitations    Rockerboard Limitations completed with eyes closed postioned ant/post and lateral, 3 x 20-30 seconds each.  Worked on being able to hold EC with board postioned laterally for longer duration, patient still demo increased unsteadiness/sway to L side.     Tandem Gait Forward;Retro;Intermittent upper extremity support;3 reps;Limitations    Tandem Gait Limitations completed on firm surface with intermittent UE support x 3 laps.              PT Education - 12/28/19 1239    Education Details HEP Update (See patient instructions)    Person(s) Educated Patient    Methods  Explanation;Demonstration;Handout    Comprehension Verbalized understanding;Returned demonstration            PT Short Term Goals - 12/14/19 1247      PT SHORT TERM GOAL #1   Title Patient will demonstrate ability to ambulate >200 ft on grass surfaces with LRAD at Supervision level    Baseline supervision, <100 ft    Time 3    Period Weeks    Status New    Target Date 01/04/20      PT SHORT TERM GOAL #2   Target Date 11/23/19             PT Long Term Goals - 12/14/19 5102  PT LONG TERM GOAL #1   Title Pt will be independent and demo compliance with Final balance/strengthening HEP    Baseline Continues to update HEP at this time    Time 6    Period Weeks    Status Revised    Target Date 01/25/20      PT LONG TERM GOAL #2   Title --    Baseline --    Time --    Period --    Status --      PT LONG TERM GOAL #3   Title Patient will improve FGA to >/= 24/30 to demonstrate reduced risk for falls    Baseline 17/30, 22/30 on 12/14/19    Time 6    Period Weeks    Status On-going    Target Date 01/25/20      PT LONG TERM GOAL #4   Title Patient will demonstrate ability to complete TUG <9 secs w/o AD to demonstrate improved mobility    Baseline 10.97, 9.69 secs on 12/14/19    Time 6    Period Weeks    Status On-going    Target Date 01/25/20      PT LONG TERM GOAL #5   Title Patient will demonstrate ability to ambulate > 200 ft on grass surfaces, Mod I, to demonstrate improved ability to ambulate in yard    Baseline supervision on grass, <100 ft    Time 6    Period Weeks    Status New    Target Date 01/25/20                 Plan - 12/28/19 1241    Clinical Impression Statement Due to increased tightness/stiffness complaints via patient, educated and updated HEP to include BLE/lumbar stretching exercises. Continued balance exercises as tolerated by patient. Patient will continue to benefit from skilled PT services to progress toward all goals.     Personal Factors and Comorbidities Comorbidity 3+    Comorbidities Obesity, hypertension, hyperlipidemia, Lt sciatic nerve pain, sleep apnea on CPAP    Examination-Activity Limitations Squat;Transfers;Stairs;Stand    Examination-Participation Restrictions Community Activity;Cleaning    Stability/Clinical Decision Making Evolving/Moderate complexity    Rehab Potential Good    PT Frequency 2x / week    PT Duration 2 weeks   followed by 1x/week for 4 weeks   PT Treatment/Interventions ADLs/Self Care Home Management;Moist Heat;Cryotherapy;Electrical Stimulation;DME Instruction;Gait training;Stair training;Functional mobility training;Therapeutic activities;Therapeutic exercise;Balance training;Neuromuscular re-education;Patient/family education;Orthotic Fit/Training;Passive range of motion    PT Next Visit Plan Notice a difference in stiffness? (addded stretches to HEP for LE and Low Back). Assess STG. Continue dynamic balance, functional strengthening, corner balance with eyes closed/head motions. Update HEP    Consulted and Agree with Plan of Care Patient           Patient will benefit from skilled therapeutic intervention in order to improve the following deficits and impairments:  Abnormal gait, Decreased balance, Decreased endurance, Difficulty walking, Decreased activity tolerance, Decreased safety awareness, Pain, Postural dysfunction, Decreased strength  Visit Diagnosis: Muscle weakness (generalized)  Unsteadiness on feet  Other abnormalities of gait and mobility  Difficulty in walking, not elsewhere classified     Problem List Patient Active Problem List   Diagnosis Date Noted  . Abdominal wall hernia 10/30/2019  . Change in voice 10/30/2019  . History of stroke 09/14/2019  . Spinal stenosis 07/24/2019  . Mixed hyperlipidemia   . OSA (obstructive sleep apnea)   . Dysfunction of eustachian tube 05/15/2019  .  Other social stressor 03/29/2019  . OA (osteoarthritis) of hip  07/21/2018  . Diarrhea 06/20/2018  . Joint pain 06/20/2018  . 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 Medicare annual wellness visit, subsequent 09/15/2017  . SUI (stress urinary incontinence, female) 08/07/2017  . Back pain 06/16/2016  . Advance care planning 01/07/2016  . Sleep apnea   . 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 Allergic rhinitis 11/24/2014  . Adult BMI 30+ 11/24/2014  . Depression 11/24/2014  . Insomnia 08/23/2009  . Headache, migraine 05/10/2009  . HERPES SIMPLEX INFECTION 08/14/2006  . HYPERCHOLESTEROLEMIA 08/14/2006  . Anxiety state 08/14/2006  . Essential hypertension 08/14/2006  . HIATAL HERNIA 08/14/2006  . IRRITABLE BOWEL SYNDROME 08/14/2006  . 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 Primary osteoarthritis of right knee 08/14/2006  . PLANTAR FASCIITIS 08/14/2006    Jones Bales, PT, DPT 12/28/2019, 12:43 PM  [facility] [address] [place], [place], [address] Phone: [phone]   Fax:  [phone]  Name: [name] [name] MRN: [id number] Date of Birth: Feb 17, 1941

## 2019-12-28 NOTE — Therapy (Signed)
[facility] [address] [place], [place], [address] Phone: [phone]   Fax:  [phone]  Occupational Therapy Treatment  Patient Details  Name: [name] [name] MRN: [id number] Date of Birth: [birth year] Referring Provider (OT): Frann Rider, NP   Encounter Date: 12/28/2019   OT End of Session - 12/28/19 1314    Visit Number 2    Number of Visits 9    Date for OT Re-Evaluation 01/06/20    Authorization Type [employer]    OT Start Time 1230    OT Stop Time 1315    OT Time Calculation (min) 45 min    Activity Tolerance Patient tolerated treatment well    Behavior During Therapy [facility] for tasks assessed/performed           Past Medical History:  Diagnosis Date  . Arthritis    knees  . Depression   . Fatty liver   . GERD (gastroesophageal reflux disease)   . Hyperlipidemia   . Hypertension   . IBS (irritable bowel syndrome)   . Obesity   . Pneumonia   . Sleep apnea    uses CPAP    Past Surgical History:  Procedure Laterality Date  . BLADDER SURGERY    . BREAST SURGERY     breast biopsy  . BROW LIFT Bilateral 04/14/2017   Procedure: BLEPHAROPLASTY UPPER EYELID WITH EXCESS SKIN;  Surgeon: Karle Starch, MD;  Location: [facility];  Service: Ophthalmology;  Laterality: Bilateral;  . CATARACT EXTRACTION W/PHACO Left 02/05/2016   Procedure: CATARACT EXTRACTION PHACO AND INTRAOCULAR LENS PLACEMENT (IOC);  Surgeon: Birder Robson, MD;  Location: [facility];  Service: Ophthalmology;  Laterality: Left;  [place] 01:10 AP% 22.3 CDE 15.67 Fluid pack lot # [id number]  . CATARACT EXTRACTION W/PHACO Right 02/26/2016   Procedure: CATARACT EXTRACTION PHACO AND INTRAOCULAR LENS PLACEMENT (IOC);  Surgeon: Birder Robson, MD;  Location: [facility];  Service: Ophthalmology;  Laterality: Right;  [place] 57.4 AP% 24.0 CDE 13.75 Fluid Pack lot # [id number]  . CHOLECYSTECTOMY    . COLONOSCOPY WITH PROPOFOL N/A 12/18/2014    Procedure: COLONOSCOPY WITH PROPOFOL;  Surgeon: Manya Silvas, MD;  Location: [facility];  Service: Endoscopy;  Laterality: N/A;  . DEEP NECK LYMPH NODE BIOPSY / EXCISION    . JOINT REPLACEMENT    . KNEE ARTHROPLASTY Left 06/20/2015   Procedure: COMPUTER ASSISTED TOTAL KNEE ARTHROPLASTY;  Surgeon: Dereck Leep, MD;  Location: [facility];  Service: Orthopedics;  Laterality: Left;  . PTOSIS REPAIR Bilateral 04/14/2017   Procedure: PTOSIS REPAIR RESECT EX;  Surgeon: Karle Starch, MD;  Location: [facility];  Service: Ophthalmology;  Laterality: Bilateral;  sleep apnea  . TONSILLECTOMY    . TOTAL HIP ARTHROPLASTY Right 07/21/2018   Procedure: TOTAL HIP ARTHROPLASTY ANTERIOR APPROACH;  Surgeon: Gaynelle Arabian, MD;  [facility];  Service: Orthopedics;  Laterality: Right;    There were no vitals filed for this visit.   Subjective Assessment - 12/28/19 1233    Pertinent History Patient is a 79 y.o. female referred to Neuro OPOT due to CVA that occurred in 06/2019. Patient PMH is significant for: Obesity, hypertension, hyperlipidemia, Lt sciatic nerve pain, and sleep apnea on CPAP.    Patient Stated Goals improve coordination    Currently in Pain? No/denies           Pt issued coordination HEP and theraband HEP - see pt instructions for details. Pt return demo of each as indicated.  UBE x 5 min, level 1 for UE strength/endurance                     OT Education - 12/28/19 1257    Education Details coordination HEP, Theraband HEP    Person(s) Educated Patient    Methods Explanation;Demonstration;Handout    Comprehension Verbalized understanding;Returned demonstration               OT Long Term Goals - 12/28/19 1314      OT LONG TERM GOAL #1   Title I with HEP for coordination and UE strength    Time 4    Period Weeks    Status Achieved      OT LONG TERM GOAL #2   Title Pt will demonstrate improved fine motor coordination  for ADLs as evidenced  by decreasing 9 hole peg test score by 5 secs.    Time 4    Period Weeks    Status New      OT LONG TERM GOAL #3   Title Pt will demonstrate ability to carry a plate in 1 hand and a beverage in the other hand 125 ' without drops or spills.    Time 4    Period Weeks    Status New      OT LONG TERM GOAL #4   Title Pt will report that she is dropping items less during ADLs/IADLs.    Time 4    Period Weeks    Status New                 Plan - 12/28/19 1315    Clinical Impression Statement Pt has met STG #1    OT Occupational Profile and History Problem Focused Assessment - Including review of records relating to presenting problem    Occupational performance deficits (Please refer to evaluation for details): ADL's;IADL's;Leisure;Play;Social Participation    Body Structure / Function / Physical Skills ADL;Balance;Endurance;Strength;UE functional use;FMC;Gait;Coordination;ROM;GMC;Decreased knowledge of use of DME;Dexterity;IADL    Rehab Potential Good    Clinical Decision Making Limited treatment options, no task modification necessary    Comorbidities Affecting Occupational Performance: May have comorbidities impacting occupational performance    Modification or Assistance to Complete Evaluation  No modification of tasks or assist necessary to complete eval    OT Frequency 1x / week    OT Duration 4 weeks   4 weeks plus eval   OT Treatment/Interventions Self-care/ADL training;Therapeutic exercise;Balance training;Manual Therapy;Neuromuscular education;Ultrasound;Therapeutic activities;DME and/or AE instruction;Paraffin;Cryotherapy;Fluidtherapy;Patient/family education;Passive range of motion;Moist Heat;Contrast Bath    Plan Continue coordination and progress towards goals    Consulted and Agree with Plan of Care Patient           Patient will benefit from skilled therapeutic intervention in order to improve the following deficits and impairments:   Body Structure / Function /  Physical Skills: ADL, Balance, Endurance, Strength, UE functional use, FMC, Gait, Coordination, ROM, GMC, Decreased knowledge of use of DME, Dexterity, IADL       Visit Diagnosis: Other lack of coordination  Muscle weakness (generalized)    Problem List Patient Active Problem List   Diagnosis Date Noted  . Abdominal wall hernia 10/30/2019  . Change in voice 10/30/2019  . History of stroke 09/14/2019  . Spinal stenosis 07/24/2019  . Mixed hyperlipidemia   . OSA (obstructive sleep apnea)   . Dysfunction of eustachian tube 05/15/2019  . Other social stressor 03/29/2019  . 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  Diarrhea 06/20/2018  . Joint pain 06/20/2018  . 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 Medicare annual wellness visit, subsequent 09/15/2017  . SUI (stress urinary incontinence, female) 08/07/2017  . Back pain 06/16/2016  . Advance care planning 01/07/2016  . Sleep apnea   . 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 Allergic rhinitis 11/24/2014  . Adult BMI 30+ 11/24/2014  . Depression 11/24/2014  . Insomnia 08/23/2009  . Headache, migraine 05/10/2009  . HERPES SIMPLEX INFECTION 08/14/2006  . HYPERCHOLESTEROLEMIA 08/14/2006  . Anxiety state 08/14/2006  . Essential hypertension 08/14/2006  . HIATAL HERNIA 08/14/2006  . IRRITABLE BOWEL SYNDROME 08/14/2006  . 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 Primary osteoarthritis of right knee 08/14/2006  . PLANTAR FASCIITIS 08/14/2006    Carey Bullocks, OTR/L 12/28/2019, 1:16 PM  [facility] [address] [place], [place], [address] Phone: [phone]   Fax:  [phone]  Name: [name] [name] MRN: [id number] Date of Birth: 1941-05-22

## 2019-12-28 NOTE — Patient Instructions (Signed)
°  Coordination Activities  Perform the following activities for 10 minutes 1-2 times per day with left hand(s).   Rotate ball in fingertips (clockwise and counter-clockwise).  Toss ball in air and catch with the same hand.  Flip cards 1 at a time as fast as you can.  Deal cards with your thumb (Hold deck in hand and push card off top with thumb).  Rotate 1 card in hand (clockwise and counter-clockwise).  Pick up coins one at a time until you get 5 in your hand, then move coins from palm to fingertips to stack one at a time.  Twirl pen between fingers.   Strengthening: Resisted Flexion   Hold tubing with __Lt___ arm(s) at side. Pull forward and up. Move shoulder through pain-free range of motion. Repeat __10__ times per set.  Do _1-2_ sessions per day , every other day   Strengthening: Resisted Extension   Hold tubing in __Lt___ hand(s), arm forward. Pull arm back, elbow straight. Repeat _10___ times per set. Do _1-2___ sessions per day, every other day.   Resisted Horizontal Abduction: Bilateral   Sit or stand, tubing in both hands, arms out in front. Keeping arms straight, pinch shoulder blades together and stretch arms out. Repeat _10___ times per set. Do _1-2___ sessions per day, every other day.

## 2019-12-28 NOTE — Patient Instructions (Signed)
Access Code: [email or web address] URL: [email or web address] Date: 12/28/2019 Prepared by: Baldomero Lamy  Exercises Tandem Walking with Counter Support - 2 x daily - 5 x weekly - 3 sets Standing Marching - 2 x daily - 5 x weekly - 3 sets Backward Walking with Counter Support - 2 x daily - 5 x weekly - 3 sets Romberg Stance Eyes Closed on Foam Pad - 1 x daily - 5 x weekly - 1 sets - 3 reps - 30 hold Romberg Stance on Foam Pad with Head Rotation - 1 x daily - 5 x weekly - 2 sets - 10 reps Romberg Stance with Head Nods on Foam Pad - 1 x daily - 7 x weekly - 3 sets - 10 reps Tandem Stance on Foam Pad with Eyes Open - 1 x daily - 5 x weekly - 1 sets - 3 reps - 30 hold Scapular Retraction with Resistance - 1 x daily - 7 x weekly - 2 sets - 10 reps Seated Cervical Retraction - 1 x daily - 7 x weekly - 2 sets - 10 reps Hooklying Single Knee to Chest Stretch - 1 x daily - 7 x weekly - 1 sets - 3 reps - 30 hold Supine Lower Trunk Rotation - 1 x daily - 7 x weekly - 1 sets - 10 reps - 15 seconds hold Supine Figure 4 Piriformis Stretch - 1 x daily - 7 x weekly - 1 sets - 3 reps - 30 hold

## 2019-12-29 DIAGNOSIS — H26491 Other secondary cataract, right eye: Secondary | ICD-10-CM | POA: Diagnosis not present

## 2020-01-02 ENCOUNTER — Ambulatory Visit: Payer: Medicare HMO | Admitting: Adult Health

## 2020-01-02 ENCOUNTER — Encounter: Payer: Self-pay | Admitting: Adult Health

## 2020-01-02 VITALS — BP 116/70 | HR 64 | Ht 60.0 in | Wt 176.8 lb

## 2020-01-02 DIAGNOSIS — E785 Hyperlipidemia, unspecified: Secondary | ICD-10-CM | POA: Diagnosis not present

## 2020-01-02 DIAGNOSIS — I639 Cerebral infarction, unspecified: Secondary | ICD-10-CM | POA: Diagnosis not present

## 2020-01-02 DIAGNOSIS — I69398 Other sequelae of cerebral infarction: Secondary | ICD-10-CM

## 2020-01-02 DIAGNOSIS — I10 Essential (primary) hypertension: Secondary | ICD-10-CM | POA: Diagnosis not present

## 2020-01-02 DIAGNOSIS — I69322 Dysarthria following cerebral infarction: Secondary | ICD-10-CM

## 2020-01-02 DIAGNOSIS — I672 Cerebral atherosclerosis: Secondary | ICD-10-CM | POA: Diagnosis not present

## 2020-01-02 DIAGNOSIS — I669 Occlusion and stenosis of unspecified cerebral artery: Secondary | ICD-10-CM

## 2020-01-02 DIAGNOSIS — R269 Unspecified abnormalities of gait and mobility: Secondary | ICD-10-CM

## 2020-01-02 NOTE — Progress Notes (Signed)
I agree with the above plan 

## 2020-01-02 NOTE — Patient Instructions (Signed)
Continue participation in outpatient therapy for hopeful further recovery  Continue aspirin 81mg  daily and atorvastatin for secondary stroke prevention prescribed by PCP  Continue to follow up with PCP regarding cholesterol and blood pressure management  Maintain strict control of hypertension with blood pressure goal below 130/90, diabetes with hemoglobin A1c goal below 6.5% and cholesterol with LDL cholesterol (bad cholesterol) goal below 70 mg/dL.       Followup in the future with me in 6 months or call earlier if needed       Thank you for coming to see [place] at [facility]. I hope we have been able to provide you high quality care today.  You may receive a patient satisfaction survey over the next few weeks. We would appreciate your feedback and comments so that we may continue to improve ourselves and the health of our patients.

## 2020-01-02 NOTE — Progress Notes (Signed)
[facility] [address] [place]. [place] [address] [phone]       HOSPITAL FOLLOW UP NOTE  [name] Date of Birth:  03-20-1941 Medical Record Number:  [id number]   Reason for Referral:  hospital stroke follow up    SUBJECTIVE:   CHIEF COMPLAINT:  Chief Complaint  Patient presents with  . Follow-up    6 month f/u for stroke, still completing therapy next door.   . treatment room    alone     HPI:   Personally reviewed recent hospitalization pertinent notes, labs and imaging [name] is a 79 y.o. female with history of HTN, HLD, obesity who presented on 07/11/2019 with dysarthria, L facial droop, mild L hemiparesis as well as hypertensive urgency.  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  Recommended DAPT for 3 weeks and aspirin alone.  BP upon arrival 202/110 stabilized during admission and recommended long-term BP goal normotensive range.  LDL 130 and initiate atorvastatin 80 mg daily. Other stroke risk factors include advanced age, EtOH use, OSA on CPAP and family reported hx imaging with evidence of small AAA.  Evaluated by therapy and recommend discharge to CIR for ongoing therapy needs but unfortunately did not by insurance therefore discharged home with home health therapy.  Also advised to follow-up with [facility] research is interested in [employer] stroke trial.  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   MRI  R corona radiata and caudate head small vessel disease. infarcts  CTA head & neck negative LVO, negative arthrosclerosis or stenosis in neck, positive for intracranial arthrosclerosis with significant stenosis within moderate to severe left ICA anterior genu, moderate bilateral ICA distally to and moderate left PCA P1  2D Echo  EF 50 to 55%  LDL 130  HgbA1c 5.5  Lovenox 40 mg sq daily for VTE  prophylaxis  No antithrombotic prior to admission, now on clopidogrel 75 mg daily and aspirin 324 mg. Change aspirin to 81. Continue DAPT x 3 weeks then aspirin alone.   Therapy recommendations: CLR -insurance denied therefore recommended HH therapy  Disposition: Home  Recommend aspirin and Plavix for 3 weeks followed by aspirin alone. She is  participating in the [employer] stroke prevention trial( standard of care antiplatelets and Factor xi inhibitor versus standard of care antiplates and placebo)      Today, 01/02/2020, [name] [name] is being seen for stroke follow-up previously followed through [facility] research participating in [place] stroke trial.  Evaluated during stroke trial on 10/10/2019 recently completing [place] therapies with residual deficits of mild decrease left hand dexterity, imbalance and dysarthria therefore additional orders placed for outpatient therapies.  She does report ongoing improvement and continues to work with neuro rehab PT/OT/ST for residual dysarthria and dysphonia, gait impairment/imbalance and LUE incoordination.  Evaluated in [place] by Dr. 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  Reports continued participation SLP with improvement  Reports imbalance greatest difficulty still with improvement but also underlying chronic lower back, bilateral hip and knee pain.  She wishes to proceed with additional injections and nerve blocks therefore withdrew from research study approximately 1 month ago.  Continues to follow with [facility]  She remains on aspirin 81mg  daily without bleeding or bruising. Continues on atorvastatin 80 mg daily without myalgias.  Recent lipid panel 10/13/2019 showed LDL 64.  Blood pressure today 116/70.  No further concerns.      ROS:   14 system review of systems performed and negative with exception of see HPI  PMH:  Past Medical History:  Diagnosis Date  . Arthritis    knees  .  Depression   . Fatty liver   . GERD (gastroesophageal reflux disease)   . Hyperlipidemia   . Hypertension   . IBS (irritable bowel syndrome)   . Obesity   . Pneumonia   . Sleep apnea    uses CPAP    PSH:  Past Surgical History:  Procedure Laterality Date  . BLADDER SURGERY    . BREAST SURGERY     breast biopsy  . BROW LIFT Bilateral 04/14/2017   Procedure: BLEPHAROPLASTY UPPER EYELID WITH EXCESS SKIN;  Surgeon: Karle Starch, MD;  Location: [facility];  Service: Ophthalmology;  Laterality: Bilateral;  . CATARACT EXTRACTION W/PHACO Left 02/05/2016   Procedure: CATARACT EXTRACTION PHACO AND INTRAOCULAR LENS PLACEMENT (IOC);  Surgeon: Birder Robson, MD;  Location: [facility];  Service: Ophthalmology;  Laterality: Left;  [place] 01:10 AP% 22.3 CDE 15.67 Fluid pack lot # [id number]  . CATARACT EXTRACTION W/PHACO Right 02/26/2016   Procedure: CATARACT EXTRACTION PHACO AND INTRAOCULAR LENS PLACEMENT (IOC);  Surgeon: Birder Robson, MD;  Location: [facility];  Service: Ophthalmology;  Laterality: Right;  [place] 57.4 AP% 24.0 CDE 13.75 Fluid Pack lot # [id number]  . CHOLECYSTECTOMY    . COLONOSCOPY WITH PROPOFOL N/A 12/18/2014   Procedure: COLONOSCOPY WITH PROPOFOL;  Surgeon: Manya Silvas, MD;  Location: [facility];  Service: Endoscopy;  Laterality: N/A;  . DEEP NECK LYMPH NODE BIOPSY / EXCISION    . JOINT REPLACEMENT    . KNEE ARTHROPLASTY Left 06/20/2015   Procedure: COMPUTER ASSISTED TOTAL KNEE ARTHROPLASTY;  Surgeon: Dereck Leep, MD;  Location: [facility];  Service: Orthopedics;  Laterality: Left;  . PTOSIS REPAIR Bilateral 04/14/2017   Procedure: PTOSIS REPAIR RESECT EX;  Surgeon: Karle Starch, MD;  Location: [facility];  Service: Ophthalmology;  Laterality: Bilateral;  sleep apnea  . TONSILLECTOMY    . TOTAL HIP ARTHROPLASTY Right 07/21/2018   Procedure: TOTAL HIP ARTHROPLASTY ANTERIOR APPROACH;  Surgeon: Gaynelle Arabian, MD;  [facility];  Service: Orthopedics;   Laterality: Right;    Social History:  Social History   Socioeconomic History  . Marital status: Widowed    Spouse name: Not on file  . Number of children: Not on file  . Years of education: Not on file  . Highest education level: Not on file  Occupational History  . Not on file  Tobacco Use  . Smoking status: Never Smoker  . Smokeless tobacco: Never Used  Vaping Use  . Vaping Use: Never used  Substance and Sexual Activity  . Alcohol use: Yes    Comment: ocassional glass of wine 2-3 times a year  . Drug use: No  . Sexual activity: Never  Other Topics Concern  . 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 Difficulty of Paying Living Expenses:   Food Insecurity:   . Worried About Charity fundraiser in the Last Year:   . [place] in the Last Year:  Transportation Needs:   . Film/video editor (Medical):   [name] Kitchen Lack of Transportation (Non-Medical):   Physical Activity:   . Days of Exercise per Week:   . Minutes of Exercise per Session:   Stress:   . Feeling of Stress :   Social Connections:   . Frequency of Communication with Friends and Family:   . Frequency of Social Gatherings with Friends and Family:   . Attends Religious Services:   . Active Member of Clubs or Organizations:   . Attends Archivist Meetings:   [name] Kitchen Marital Status:   Intimate Partner Violence:   . Fear of Current or Ex-Partner:   . Emotionally Abused:   [name] Kitchen Physically Abused:   . Sexually Abused:     Family History:  Family History  Problem Relation Age of Onset  . Heart attack Mother   . Dementia Father   . Aortic aneurysm Brother   . Colon cancer Neg Hx   . Breast cancer Neg Hx     Medications:   Current Outpatient Medications on File Prior to Visit  Medication Sig  Dispense Refill  . acetaminophen (TYLENOL) 500 MG tablet Take 500 mg by mouth every 6 (six) hours as needed for moderate pain.     [name] Kitchen aspirin EC 81 MG tablet Take 81 mg by mouth daily.    [name] Kitchen atenolol (TENORMIN) 50 MG tablet TAKE ONE TABLET TWICE DAILY 180 tablet 3  . atorvastatin (LIPITOR) 80 MG tablet TAKE ONE TABLET BY MOUTH EVERY DAY AT 6PM 30 tablet 5  . Biotin 10000 MCG TABS Take 10,000 mcg by mouth daily.    . cetirizine (ZYRTEC) 10 MG tablet Take 10 mg by mouth daily as needed for allergies.    . cholecalciferol (VITAMIN D) 1000 UNITS tablet Take 1,000 Units by mouth daily.     . clobetasol ointment (TEMOVATE) 7.00 % Apply 1 application topically daily as needed (for itching).    . fluticasone (FLONASE) 50 MCG/ACT nasal spray Place 2 sprays into both nostrils daily as needed for allergies or rhinitis.    . hydrOXYzine (ATARAX/VISTARIL) 10 MG tablet Take 0.5-1 tablets (5-10 mg total) by mouth at bedtime as needed (for insomnia). 30 tablet 2  . hyoscyamine (LEVSIN, ANASPAZ) 0.125 MG tablet Take 1 tablet (0.125 mg total) by mouth 3 (three) times daily as needed. 100 tablet 1  . Magnesium 500 MG CAPS Take by mouth every other day.    . NON FORMULARY Clinical Trial Drugs    . Polyethyl Glycol-Propyl Glycol (SYSTANE) 0.4-0.3 % SOLN Apply to eye.    . sertraline (ZOLOFT) 25 MG tablet Take 1 tablet (25 mg total) by mouth daily. 90 tablet 1   No current facility-administered medications on file prior to visit.    Allergies:   Allergies  Allergen Reactions  . Hydrocodone Nausea Only    Noted after surgery, may be able to tolerate with food      OBJECTIVE:  Physical Exam  Vitals:   01/02/20 0934  BP: 116/70  Pulse: 64  Weight: 176 lb 12.8 oz (80.2 kg)  Height: 5' (1.524 m)   Body mass index is 34.53 kg/m. No exam data present  General: well developed, well nourished,  pleasant elderly Caucasian female, seated, in no evident distress Neck: supple with no carotid or  supraclavicular bruits Cardiovascular: regular rate and rhythm, no murmurs Vascular:  Normal pulses all extremities   Neurologic Exam Mental Status: Awake and fully alert.   Mild dysarthria with  dysphonia.  Oriented to place and time. Recent and remote memory intact. Attention span, concentration and fund of knowledge appropriate. Mood and affect appropriate.  Cranial Nerves: Fundoscopic exam reveals sharp disc margins. Pupils equal, briskly reactive to light. Extraocular movements full without nystagmus. Visual fields full to confrontation. Hearing intact. Facial sensation intact.  Tongue and palate moves normally and symmetrically.  Mild left lower facial weakness Motor: Normal bulk and tone. Normal strength in all tested extremity muscles except slight decrease left hand fine motor weakness. Sensory.: intact to touch , pinprick , position and vibratory sensation.  Coordination: Rapid alternating movements normal in all extremities except decreased left hand dexterity. Finger-to-nose and heel-to-shin performed accurately bilaterally. Orbits right arm over left arm. Gait and Station: Arises from chair without difficulty. Stance is normal. Gait demonstrates normal stride length and balance without use of assistive device Reflexes: 1+ and symmetric. Toes downgoing.     NIHSS  1 Modified Rankin  2     ASSESSMENT: [name] [name] is a 79 y.o. year old female presented with dysarthria, left facial droop and left hemiparesis on 07/11/2019 with stroke work-up revealing right CR and caudate head infarcts secondary to small vessel disease. Vascular risk factors include HTN, HLD, intracranial stenosis with stenosis, OSA on CPAP, EtOH use and obesity.      PLAN:  1. R MCA stroke :  a. Residual deficit: Dysarthria with dysphonia, decreased dexterity LUE and imbalance.  Encouraged continued participation with neuro rehab PT/OT/SLP for likely ongoing improvement b. Continue aspirin 81 mg daily and  atorvastatin for secondary stroke prevention c. Previously participating in [employer] stroke trial but recently withdrew participation d. Close PCP follow up for aggressive stroke risk factor management including HTN and HLD 2. HTN:  a. BP goal <130/90.  Stable today b. Continue atenolol managed by PCP c. Continue f/u with PCP 3. HLD: a.  LDL goal <70. Recent LDL 64. b. Continue atorvastatin managed by PCP c. F/u with PCP for monitoring of lipid panel and statin prescribing 4. Intracranial atherosclerosis with stenosis a. Asymptomatic.  Advised ongoing use of antithrombotic and statin along with aggressive stroke risk factor management.  No location for repeat imaging at this time as asymptomatic and current treatment plan would not change. 5. OSA on CPAP:  a. Encouraged ongoing compliance for secondary stroke prevention    Follow up in 6 months or call earlier if needed   I spent 40 minutes of face-to-face and non-face-to-face time with patient.  This included previsit chart review, lab review, study review, order entry, electronic health record documentation, patient education regarding recent stroke, residual deficits, importance of managing stroke risk factors and answered all questions to patient satisfaction   Frann Rider, [facility]  [facility] [address] [place], [place] [address]  Phone [phone] Fax [phone] Note: This document was prepared with digital dictation and possible smart phrase technology. Any transcriptional errors that result from this process are unintentional.

## 2020-01-06 ENCOUNTER — Ambulatory Visit: Payer: Medicare HMO

## 2020-01-06 ENCOUNTER — Ambulatory Visit: Payer: Medicare HMO | Admitting: Physical Therapy

## 2020-01-06 ENCOUNTER — Other Ambulatory Visit: Payer: Self-pay

## 2020-01-06 DIAGNOSIS — R2689 Other abnormalities of gait and mobility: Secondary | ICD-10-CM | POA: Diagnosis not present

## 2020-01-06 DIAGNOSIS — R498 Other voice and resonance disorders: Secondary | ICD-10-CM | POA: Diagnosis not present

## 2020-01-06 DIAGNOSIS — R471 Dysarthria and anarthria: Secondary | ICD-10-CM | POA: Diagnosis not present

## 2020-01-06 DIAGNOSIS — R2681 Unsteadiness on feet: Secondary | ICD-10-CM | POA: Diagnosis not present

## 2020-01-06 DIAGNOSIS — R262 Difficulty in walking, not elsewhere classified: Secondary | ICD-10-CM | POA: Diagnosis not present

## 2020-01-06 DIAGNOSIS — M6281 Muscle weakness (generalized): Secondary | ICD-10-CM | POA: Diagnosis not present

## 2020-01-06 DIAGNOSIS — R278 Other lack of coordination: Secondary | ICD-10-CM | POA: Diagnosis not present

## 2020-01-06 NOTE — Therapy (Signed)
[facility] [address] [place], [place], [address] Phone: [phone]   Fax:  [phone]  Speech Language Pathology Treatment  Patient Details  Name: [name] [name] MRN: [id number] Date of Birth: 1940/09/12 Referring Provider (SLP): Frann Rider, NP   Encounter Date: 01/06/2020   End of Session - 01/06/20 1225    Visit Number 11    Number of Visits 18    Date for SLP Re-Evaluation 02/17/20    SLP Start Time 1104    SLP Stop Time  1145    SLP Time Calculation (min) 41 min    Activity Tolerance Patient tolerated treatment well           Past Medical History:  Diagnosis Date  . Arthritis    knees  . Depression   . Fatty liver   . GERD (gastroesophageal reflux disease)   . Hyperlipidemia   . Hypertension   . IBS (irritable bowel syndrome)   . Obesity   . Pneumonia   . Sleep apnea    uses CPAP    Past Surgical History:  Procedure Laterality Date  . BLADDER SURGERY    . BREAST SURGERY     breast biopsy  . BROW LIFT Bilateral 04/14/2017   Procedure: BLEPHAROPLASTY UPPER EYELID WITH EXCESS SKIN;  Surgeon: Karle Starch, MD;  Location: [facility];  Service: Ophthalmology;  Laterality: Bilateral;  . CATARACT EXTRACTION W/PHACO Left 02/05/2016   Procedure: CATARACT EXTRACTION PHACO AND INTRAOCULAR LENS PLACEMENT (IOC);  Surgeon: Birder Robson, MD;  Location: [facility];  Service: Ophthalmology;  Laterality: Left;  [place] 01:10 AP% 22.3 CDE 15.67 Fluid pack lot # [id number]  . CATARACT EXTRACTION W/PHACO Right 02/26/2016   Procedure: CATARACT EXTRACTION PHACO AND INTRAOCULAR LENS PLACEMENT (IOC);  Surgeon: Birder Robson, MD;  Location: [facility];  Service: Ophthalmology;  Laterality: Right;  [place] 57.4 AP% 24.0 CDE 13.75 Fluid Pack lot # [id number]  . CHOLECYSTECTOMY    . COLONOSCOPY WITH PROPOFOL N/A 12/18/2014   Procedure: COLONOSCOPY WITH PROPOFOL;  Surgeon: Manya Silvas, MD;  Location: [facility]  ENDOSCOPY;  Service: Endoscopy;  Laterality: N/A;  . DEEP NECK LYMPH NODE BIOPSY / EXCISION    . JOINT REPLACEMENT    . KNEE ARTHROPLASTY Left 06/20/2015   Procedure: COMPUTER ASSISTED TOTAL KNEE ARTHROPLASTY;  Surgeon: Dereck Leep, MD;  Location: [facility];  Service: Orthopedics;  Laterality: Left;  . PTOSIS REPAIR Bilateral 04/14/2017   Procedure: PTOSIS REPAIR RESECT EX;  Surgeon: Karle Starch, MD;  Location: [facility];  Service: Ophthalmology;  Laterality: Bilateral;  sleep apnea  . TONSILLECTOMY    . TOTAL HIP ARTHROPLASTY Right 07/21/2018   Procedure: TOTAL HIP ARTHROPLASTY ANTERIOR APPROACH;  Surgeon: Gaynelle Arabian, MD;  Location: [facility];  Service: Orthopedics;  Laterality: Right;    There were no vitals filed for this visit.   Subjective Assessment - 01/06/20 1106    Subjective "I looked at (the homework) and thought, Am I supposed to do what this says to do or just say the words?"    Currently in Pain? No/denies                 ADULT SLP TREATMENT - 01/06/20 1116      General Information   Behavior/Cognition Alert;Cooperative;Pleasant mood      Treatment Provided   Treatment provided Cognitive-Linquistic      Cognitive-Linquistic Treatment   Treatment focused on Voice;Patient/family/caregiver education    Skilled Treatment Pt, re  her voice, "I've been fighting allergies AGAIN, [name]. That scratchy voice." SLP practiced some exercises with flow phonation using SOVT tasks as pt walked in with harsh voice and vocal fry. Immediately after each task pt's voice normalized but reverted back to harsh with vocal fry/reduced breath support within 10 seconds. With this response, SLP quesitoned pt's adherence to recommended frequency of HEP practice at home, so SLP asked pt her practice routine and pt stated "If I'm practicing once a day, [name], I'm doing good." SLP and pt discussed what times of day pt could be more consistent with HEP, and what specifically pt needed to  do for her HEP. Pt left demonstrating understanding of contents of HEP and stated she would be better with consistency for next session. SLP targeted abdominal breathing (AB) with pt in seated and pt with chest breathing/AB in 70/30 ratio so SLP took pt to PT mat and had her perform AB in reclined with tactile and visual cues with hands on chest and abdomen. Pt with hypercapacity breathing and demonstrated chest and AB simultaneously for 8 breaths - when SLP told pt to breathe normally and then pt performed AB without cues. SLP had pt add two more vistis (x1/week) onto her schedule should pt require them.       Assessment / Recommendations / Plan   Plan Continue with current plan of care      Progression Toward Goals   Progression toward goals Not progressing toward goals (comment)   decr'd follow through with HEP           SLP Education - 01/06/20 1224    Education Details HEP, HEP must be completed more frequently than twice-three times a week, rationale for HEP daily    Person(s) Educated Patient    Methods Explanation    Comprehension Verbalized understanding            SLP Short Term Goals - 12/14/19 1006      SLP SHORT TERM GOAL #1   Title Pt will complete HEP for labial weakness with mod I over 2 sessions    Time 2    Period Weeks    Status Achieved      SLP SHORT TERM GOAL #2   Title Pt will produce sibalant fricatives accurately at sentence level with rare min A 18/20 sentences    Time 2    Period Weeks    Status Achieved      SLP SHORT TERM GOAL #3   Title Pt will follow 3 reflux precautions over 3 sessions with mod I    Baseline ENT did not report reflux    Time 2    Period Weeks    Status Deferred      SLP SHORT TERM GOAL #4   Title Pt will follow 3 vocal hygiene strategies over 3 sessions with mod I    Baseline voice goals to be modified pending ENT consult    Time 1    Period Weeks    Status On-going      SLP SHORT TERM GOAL #5   Title pt to demo  abdominal breathing at rest 75% success over 5 minutes    Time 3    Period Weeks    Status On-going      SLP SHORT TERM GOAL #6   Title pt to complete tasks mitigating muscle tension dysphonia with rare min A over three sessions    Time 2    Period Weeks  Status On-going            SLP Long Term Goals - 01/06/20 1229      SLP LONG TERM GOAL #1   Title Pt will produce sibalant fricatives accurately in simple conversation over 15 minutes, self corrected as needed, with occasional min A over 2 session    Time 4   all unmet LTGs renewed 01-06-20   Period Weeks    Status On-going      SLP LONG TERM GOAL #2   Title Pt will achieve WNL phonation using strategies 15/20 sentences with occasional min A    Baseline may be modified following ENT consult    Time 4    Period Weeks    Status On-going      SLP LONG TERM GOAL #3   Title Pt will follow 4 vocal hygiene strategies over 3 sessions with rare min A    Time 4    Period Weeks    Status On-going      SLP LONG TERM GOAL #4   Title Pt will improve Communicative Effectiveness Survey score by 2 points    Time 4    Period Weeks    Status On-going      SLP LONG TERM GOAL #5   Title pt to use WNL voice quality in 8 minutes simple conversation x3 sessions    Time 4    Period Weeks    Status On-going      SLP LONG TERM GOAL #6   Title pt to demo PHorTE exercises with rare min A in 3 sessions    Time 4    Period Weeks    Status On-going            Plan - 01/06/20 1225    Clinical Impression Statement Ongoing training for HEP, 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 Pt continues with suboptimal compliance with her voice HEP and SLP reiterated rationale for HEP and daily performance of HEP to pt today. Jessy is achieving caryover of clear phonation after SOVT tasks for 10 seconds and then reverts back to harsh/pressed voice. IN structured tasks, she has less success than she did 2-3 weeks ago. Skilled ST  remains necessary to habitualize WNL vocal quality.    Speech Therapy Frequency 2x / week   however, pt only has scheduled herself x1/week   Duration --   4 additional weeks or 18 total visits   Treatment/Interventions Aspiration precaution training;Environmental controls;Cueing hierarchy;Oral motor exercises;SLP instruction and feedback;Compensatory strategies;Functional tasks;Cognitive reorganization;Compensatory techniques;Diet toleration management by SLP;Internal/external aids;Multimodal communcation approach;Patient/family education    Potential to Achieve Goals Good           Patient will benefit from skilled therapeutic intervention in order to improve the following deficits and impairments:   Dysarthria and anarthria - Plan: SLP plan of care cert/re-cert  Other voice and resonance disorders - Plan: SLP plan of care cert/re-cert    Problem List Patient Active Problem List   Diagnosis Date Noted  . Abdominal wall hernia 10/30/2019  . Change in voice 10/30/2019  . History of stroke 09/14/2019  . Spinal stenosis 07/24/2019  . Mixed hyperlipidemia   . OSA (obstructive sleep apnea)   . Dysfunction of eustachian tube 05/15/2019  . Other social stressor 03/29/2019  . 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 Diarrhea 06/20/2018  . Joint pain 06/20/2018  . 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 Medicare annual wellness visit, subsequent 09/15/2017  . SUI (stress  urinary incontinence, female) 08/07/2017  . Back pain 06/16/2016  . Advance care planning 01/07/2016  . Sleep apnea   . 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 Allergic rhinitis 11/24/2014  . Adult BMI 30+ 11/24/2014  . Depression 11/24/2014  . Insomnia 08/23/2009  . Headache, migraine 05/10/2009  . HERPES SIMPLEX INFECTION 08/14/2006  . HYPERCHOLESTEROLEMIA 08/14/2006  . Anxiety state 08/14/2006  . Essential hypertension 08/14/2006  . HIATAL HERNIA  08/14/2006  . IRRITABLE BOWEL SYNDROME 08/14/2006  . 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 Primary osteoarthritis of right knee 08/14/2006  . PLANTAR FASCIITIS 08/14/2006    [facility] 01/06/2020, 12:32 PM  [facility] [address] [place], [place], [address] Phone: [phone]   Fax:  [phone]   Name: LINZEE DEPAUL MRN: [id number] Date of Birth: [birth year]

## 2020-01-06 NOTE — Therapy (Signed)
[facility] [address] [place], [place], [address] Phone: [phone]   Fax:  [phone]  Physical Therapy Treatment  Patient Details  Name: [name] [name] MRN: [id number] Date of Birth: 1940-10-28 Referring Provider (PT): Referred by Frann Rider, NP, PCP is Elsie Stain, MD    Encounter Date: 01/06/2020   PT End of Session - 01/06/20 1106    Visit Number 15    Number of Visits 18    Date for PT Re-Evaluation 02/12/20   updated POC for 6 weeks, cert for 60 days   Authorization Type Medicare (10th visit PN)    PT Start Time 1019    PT Stop Time 1058    PT Time Calculation (min) 39 min    Equipment Utilized During Treatment Gait belt    Activity Tolerance Patient tolerated treatment well    Behavior During Therapy WFL for tasks assessed/performed           Past Medical History:  Diagnosis Date  . Arthritis    knees  . Depression   . Fatty liver   . GERD (gastroesophageal reflux disease)   . Hyperlipidemia   . Hypertension   . IBS (irritable bowel syndrome)   . Obesity   . Pneumonia   . Sleep apnea    uses CPAP    Past Surgical History:  Procedure Laterality Date  . BLADDER SURGERY    . BREAST SURGERY     breast biopsy  . BROW LIFT Bilateral 04/14/2017   Procedure: BLEPHAROPLASTY UPPER EYELID WITH EXCESS SKIN;  Surgeon: Karle Starch, MD;  Location: [facility];  Service: Ophthalmology;  Laterality: Bilateral;  . CATARACT EXTRACTION W/PHACO Left 02/05/2016   Procedure: CATARACT EXTRACTION PHACO AND INTRAOCULAR LENS PLACEMENT (IOC);  Surgeon: Birder Robson, MD;  Location: [facility];  Service: Ophthalmology;  Laterality: Left;  [place] 01:10 AP% 22.3 CDE 15.67 Fluid pack lot # [id number]  . CATARACT EXTRACTION W/PHACO Right 02/26/2016   Procedure: CATARACT EXTRACTION PHACO AND INTRAOCULAR LENS PLACEMENT (IOC);  Surgeon: Birder Robson, MD;  Location: [facility];  Service: Ophthalmology;   Laterality: Right;  [place] 57.4 AP% 24.0 CDE 13.75 Fluid Pack lot # [id number]  . CHOLECYSTECTOMY    . COLONOSCOPY WITH PROPOFOL N/A 12/18/2014   Procedure: COLONOSCOPY WITH PROPOFOL;  Surgeon: Manya Silvas, MD;  Location: [facility];  Service: Endoscopy;  Laterality: N/A;  . DEEP NECK LYMPH NODE BIOPSY / EXCISION    . JOINT REPLACEMENT    . KNEE ARTHROPLASTY Left 06/20/2015   Procedure: COMPUTER ASSISTED TOTAL KNEE ARTHROPLASTY;  Surgeon: Dereck Leep, MD;  Location: [facility];  Service: Orthopedics;  Laterality: Left;  . PTOSIS REPAIR Bilateral 04/14/2017   Procedure: PTOSIS REPAIR RESECT EX;  Surgeon: Karle Starch, MD;  Location: [facility];  Service: Ophthalmology;  Laterality: Bilateral;  sleep apnea  . TONSILLECTOMY    . TOTAL HIP ARTHROPLASTY Right 07/21/2018   Procedure: TOTAL HIP ARTHROPLASTY ANTERIOR APPROACH;  Surgeon: Gaynelle Arabian, MD;  Location: [facility];  Service: Orthopedics;  Laterality: Right;    There were no vitals filed for this visit.   Subjective Assessment - 01/06/20 1022    Subjective Reports her balance has been getting better since starting therapy. Has been doing the stretches for her low back.    Patient is accompained by: Family member   cousin   Pertinent History Obesity, hypertension, hyperlipidemia, Lt sciatic nerve pain, sleep apnea on CPAP.    Limitations Walking;Standing;Sitting  Patient Stated Goals Feel more like myself    Currently in Pain? No/denies                             [facility] Adult PT Treatment/Exercise - 01/06/20 1030      Ambulation/Gait   Ambulation/Gait Yes    Ambulation/Gait Assistance 5: Supervision    Ambulation/Gait Assistance Details no LOB on grass    Ambulation Distance (Feet) 300 Feet    Assistive device None    Gait Pattern Step-through pattern;Decreased arm swing - right;Decreased arm swing - left;Decreased dorsiflexion - right;Decreased dorsiflexion - left;Poor foot clearance - left;Ataxic     Ambulation Surface Unlevel;Outdoor;Grass               Balance Exercises - 01/06/20 0001      Balance Exercises: Standing   Standing Eyes Closed Narrow base of support (BOS);Foam/compliant surface;3 reps;30 secs;Head turns;Limitations    Standing Eyes Closed Limitations on blue foam: then performing with more narrow BOS but feet not touching 2 x 10 reps head turns    SLS Eyes open;Foam/compliant surface;Limitations    SLS Limitations standing on blue compliant mat: alternating stepping over hurdle x10 reps B, intermittent UE support, cues for incr foot clearance esp on RLE    SLS with Vectors Intermittent upper extremity assist;Limitations    SLS with Vectors Limitations standing on rockerboard in A/P direction: alternating forward toe taps to cones, x20 reps, beginning with UE support and progressing to intermittent to no UE support, min guard for balance    Other Standing Exercises On blue/red mats next to countertop: forward marching and then retro marching down and back 3 reps with intermittent UE support, cues for slowed and controlled, tandem gait forwards down and back 3 reps with intermittent fingertip support - pt needing one episode of min A for balance.                PT Short Term Goals - 01/06/20 1024      PT SHORT TERM GOAL #1   Title Patient will demonstrate ability to ambulate >200 ft on grass surfaces with LRAD at Supervision level    Baseline supervision for 300 ft    Time 3    Period Weeks    Status Achieved    Target Date 01/04/20      PT SHORT TERM GOAL #2   Target Date 11/23/19             PT Long Term Goals - 12/14/19 0809      PT LONG TERM GOAL #1   Title Pt will be independent and demo compliance with Final balance/strengthening HEP    Baseline Continues to update HEP at this time    Time 6    Period Weeks    Status Revised    Target Date 01/25/20      PT LONG TERM GOAL #2   Title --    Baseline --    Time --    Period --     Status --      PT LONG TERM GOAL #3   Title Patient will improve FGA to >/= 24/30 to demonstrate reduced risk for falls    Baseline 17/30, 22/30 on 12/14/19    Time 6    Period Weeks    Status On-going    Target Date 01/25/20      PT LONG TERM GOAL #4   Title Patient will  demonstrate ability to complete TUG <9 secs w/o AD to demonstrate improved mobility    Baseline 10.97, 9.69 secs on 12/14/19    Time 6    Period Weeks    Status On-going    Target Date 01/25/20      PT LONG TERM GOAL #5   Title Patient will demonstrate ability to ambulate > 200 ft on grass surfaces, Mod I, to demonstrate improved ability to ambulate in yard    Baseline supervision on grass, <100 ft    Time 6    Period Weeks    Status New    Target Date 01/25/20                 Plan - 01/06/20 1108    Clinical Impression Statement Pt met STG in regards to gait outdoors on grass - able to perform 300' with supervision with no LOB. Remainder of session focused on balance on compliant surfaces working on vestibular input for balance, narrow BOS, and SLS. Pt challenged by SLS activities, but improving with incr reps. Needing intermittent min guard/min A throughout session. Pt is progressing well, will continue to progress towards LTGs.    Personal Factors and Comorbidities Comorbidity 3+    Comorbidities Obesity, hypertension, hyperlipidemia, Lt sciatic nerve pain, sleep apnea on CPAP    Examination-Activity Limitations Squat;Transfers;Stairs;Stand    Examination-Participation Restrictions Community Activity;Cleaning    Stability/Clinical Decision Making Evolving/Moderate complexity    Rehab Potential Good    PT Frequency 2x / week    PT Duration 2 weeks   followed by 1x/week for 4 weeks   PT Treatment/Interventions ADLs/Self Care Home Management;Moist Heat;Cryotherapy;Electrical Stimulation;DME Instruction;Gait training;Stair training;Functional mobility training;Therapeutic activities;Therapeutic  exercise;Balance training;Neuromuscular re-education;Patient/family education;Orthotic Fit/Training;Passive range of motion    PT Next Visit Plan Continue dynamic balance, functional strengthening, corner balance with eyes closed/head motions. Update HEP    Consulted and Agree with Plan of Care Patient           Patient will benefit from skilled therapeutic intervention in order to improve the following deficits and impairments:  Abnormal gait, Decreased balance, Decreased endurance, Difficulty walking, Decreased activity tolerance, Decreased safety awareness, Pain, Postural dysfunction, Decreased strength  Visit Diagnosis: Muscle weakness (generalized)  Unsteadiness on feet  Other abnormalities of gait and mobility  Difficulty in walking, not elsewhere classified     Problem List Patient Active Problem List   Diagnosis Date Noted  . Abdominal wall hernia 10/30/2019  . Change in voice 10/30/2019  . History of stroke 09/14/2019  . Spinal stenosis 07/24/2019  . Mixed hyperlipidemia   . OSA (obstructive sleep apnea)   . Dysfunction of eustachian tube 05/15/2019  . Other social stressor 03/29/2019  . 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 Diarrhea 06/20/2018  . Joint pain 06/20/2018  . 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 Medicare annual wellness visit, subsequent 09/15/2017  . SUI (stress urinary incontinence, female) 08/07/2017  . Back pain 06/16/2016  . Advance care planning 01/07/2016  . Sleep apnea   . 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 Allergic rhinitis 11/24/2014  . Adult BMI 30+ 11/24/2014  . Depression 11/24/2014  . Insomnia 08/23/2009  . Headache, migraine 05/10/2009  . HERPES SIMPLEX INFECTION 08/14/2006  . HYPERCHOLESTEROLEMIA 08/14/2006  . Anxiety state 08/14/2006  . Essential hypertension 08/14/2006  . HIATAL HERNIA 08/14/2006  . IRRITABLE BOWEL SYNDROME 08/14/2006  . ROSACEA  08/14/2006  . Primary osteoarthritis of right knee 08/14/2006  .  PLANTAR FASCIITIS 08/14/2006    Arliss Journey, PT, DPT  01/06/2020, 11:09 AM  [facility] [address] [place], [place], [address] Phone: [phone]   Fax:  [phone]  Name: [name] [name] MRN: [id number] Date of Birth: 30-Mar-1941

## 2020-01-06 NOTE — Patient Instructions (Signed)
SLP delineated pt's HEP tasks for her on previous homework sheets

## 2020-01-09 ENCOUNTER — Other Ambulatory Visit: Payer: Self-pay | Admitting: Family Medicine

## 2020-01-09 DIAGNOSIS — F329 Major depressive disorder, single episode, unspecified: Secondary | ICD-10-CM

## 2020-01-09 DIAGNOSIS — F32A Depression, unspecified: Secondary | ICD-10-CM

## 2020-01-09 NOTE — Progress Notes (Signed)
Agree.  Thanks.    Physician Documentation  Your signature is required to indicate approval of the treatment plan as stated above. By signing this report, you are approving the plan of care. Please sign and either send electronically or print and fax the signed copy to the number below. If you approve with modifications, please indicate those in the space provided._________________  Physician Signature: ___Graham Damita Dunnings Date:___08/16/21 ____ Time:__10:10 AM___

## 2020-01-10 ENCOUNTER — Ambulatory Visit (HOSPITAL_COMMUNITY): Payer: Medicare HMO

## 2020-01-11 ENCOUNTER — Encounter: Payer: Self-pay | Admitting: Speech Pathology

## 2020-01-11 ENCOUNTER — Ambulatory Visit: Payer: Medicare HMO

## 2020-01-11 ENCOUNTER — Other Ambulatory Visit: Payer: Self-pay

## 2020-01-11 ENCOUNTER — Ambulatory Visit: Payer: Medicare HMO | Admitting: Speech Pathology

## 2020-01-11 DIAGNOSIS — R2689 Other abnormalities of gait and mobility: Secondary | ICD-10-CM

## 2020-01-11 DIAGNOSIS — R498 Other voice and resonance disorders: Secondary | ICD-10-CM

## 2020-01-11 DIAGNOSIS — R2681 Unsteadiness on feet: Secondary | ICD-10-CM

## 2020-01-11 DIAGNOSIS — M6281 Muscle weakness (generalized): Secondary | ICD-10-CM | POA: Diagnosis not present

## 2020-01-11 DIAGNOSIS — R262 Difficulty in walking, not elsewhere classified: Secondary | ICD-10-CM

## 2020-01-11 DIAGNOSIS — Z6835 Body mass index (BMI) 35.0-35.9, adult: Secondary | ICD-10-CM | POA: Diagnosis not present

## 2020-01-11 DIAGNOSIS — Z1231 Encounter for screening mammogram for malignant neoplasm of breast: Secondary | ICD-10-CM | POA: Diagnosis not present

## 2020-01-11 DIAGNOSIS — Z01419 Encounter for gynecological examination (general) (routine) without abnormal findings: Secondary | ICD-10-CM | POA: Diagnosis not present

## 2020-01-11 DIAGNOSIS — R278 Other lack of coordination: Secondary | ICD-10-CM | POA: Diagnosis not present

## 2020-01-11 DIAGNOSIS — M810 Age-related osteoporosis without current pathological fracture: Secondary | ICD-10-CM | POA: Insufficient documentation

## 2020-01-11 DIAGNOSIS — M1990 Unspecified osteoarthritis, unspecified site: Secondary | ICD-10-CM | POA: Insufficient documentation

## 2020-01-11 DIAGNOSIS — R471 Dysarthria and anarthria: Secondary | ICD-10-CM | POA: Diagnosis not present

## 2020-01-11 NOTE — Therapy (Signed)
[facility] [address] [place], [place], [address] Phone: [phone]   Fax:  [phone]  Physical Therapy Treatment  Patient Details  Name: [name] [name] MRN: [id number] Date of Birth: 04/21/1941 Referring Provider (PT): Referred by Frann Rider, NP, PCP is Elsie Stain, MD    Encounter Date: 01/11/2020   PT End of Session - 01/11/20 1324    Visit Number 16    Number of Visits 18    Date for PT Re-Evaluation 02/12/20   updated POC for 6 weeks, cert for 60 days   Authorization Type Medicare (10th visit PN)    PT Start Time 1316    PT Stop Time 1359    PT Time Calculation (min) 43 min    Equipment Utilized During Treatment Gait belt    Activity Tolerance Patient tolerated treatment well    Behavior During Therapy [facility] for tasks assessed/performed           Past Medical History:  Diagnosis Date  . Arthritis    knees  . Depression   . Fatty liver   . GERD (gastroesophageal reflux disease)   . Hyperlipidemia   . Hypertension   . IBS (irritable bowel syndrome)   . Obesity   . Pneumonia   . Sleep apnea    uses CPAP    Past Surgical History:  Procedure Laterality Date  . BLADDER SURGERY    . BREAST SURGERY     breast biopsy  . BROW LIFT Bilateral 04/14/2017   Procedure: BLEPHAROPLASTY UPPER EYELID WITH EXCESS SKIN;  Surgeon: Karle Starch, MD;  Location: [facility];  Service: Ophthalmology;  Laterality: Bilateral;  . CATARACT EXTRACTION W/PHACO Left 02/05/2016   Procedure: CATARACT EXTRACTION PHACO AND INTRAOCULAR LENS PLACEMENT (IOC);  Surgeon: Birder Robson, MD;  Location: [facility];  Service: Ophthalmology;  Laterality: Left;  [place] 01:10 AP% 22.3 CDE 15.67 Fluid pack lot # [id number]  . CATARACT EXTRACTION W/PHACO Right 02/26/2016   Procedure: CATARACT EXTRACTION PHACO AND INTRAOCULAR LENS PLACEMENT (IOC);  Surgeon: Birder Robson, MD;  Location: [facility];  Service: Ophthalmology;   Laterality: Right;  [place] 57.4 AP% 24.0 CDE 13.75 Fluid Pack lot # [id number]  . CHOLECYSTECTOMY    . COLONOSCOPY WITH PROPOFOL N/A 12/18/2014   Procedure: COLONOSCOPY WITH PROPOFOL;  Surgeon: Manya Silvas, MD;  Location: [facility];  Service: Endoscopy;  Laterality: N/A;  . DEEP NECK LYMPH NODE BIOPSY / EXCISION    . JOINT REPLACEMENT    . KNEE ARTHROPLASTY Left 06/20/2015   Procedure: COMPUTER ASSISTED TOTAL KNEE ARTHROPLASTY;  Surgeon: Dereck Leep, MD;  Location: [facility];  Service: Orthopedics;  Laterality: Left;  . PTOSIS REPAIR Bilateral 04/14/2017   Procedure: PTOSIS REPAIR RESECT EX;  Surgeon: Karle Starch, MD;  Location: [facility];  Service: Ophthalmology;  Laterality: Bilateral;  sleep apnea  . TONSILLECTOMY    . TOTAL HIP ARTHROPLASTY Right 07/21/2018   Procedure: TOTAL HIP ARTHROPLASTY ANTERIOR APPROACH;  Surgeon: Gaynelle Arabian, MD;  Location: [facility];  Service: Orthopedics;  Laterality: Right;    There were no vitals filed for this visit.   Subjective Assessment - 01/11/20 1320    Subjective Patient reports that been doing stretches for low back, believes that may has helped a little bit but still have morning stiffness.    Patient is accompained by: Family member   cousin   Pertinent History Obesity, hypertension, hyperlipidemia, Lt sciatic nerve pain, sleep apnea on CPAP.  Limitations Walking;Standing;Sitting    Patient Stated Goals Feel more like myself    Currently in Pain? No/denies                             [facility] Adult PT Treatment/Exercise - 01/11/20 0001      Ambulation/Gait   Ambulation/Gait Yes    Ambulation/Gait Assistance 5: Supervision    Ambulation/Gait Assistance Details throughout therapy gym with activities    Assistive device None    Gait Pattern Step-through pattern;Decreased arm swing - right;Decreased arm swing - left;Decreased dorsiflexion - right;Decreased dorsiflexion - left;Poor foot clearance - left;Ataxic     Ambulation Surface Level;Indoor      High Level Balance   High Level Balance Activities Head turns    High Level Balance Comments Completed horizontal/vertical head turns with ambulation. With horizontal/vertical added in dual tasking component including naming vegetables/fruits. Patient often prioritizing cognitive task > walking task demo difficutly with dual tasking ability.       Self-Care   Self-Care Other Self-Care Comments    Other Self-Care Comments  Patient reporting that she wants to have knee replacement but hesistant on having a set back with the surgery and having to complete rehab process over. PT educating on speaking with surgeon and determining plan with going forward. Pain and stiffness in the knee has limtied sessions throughout current POC.                Balance Exercises - 01/11/20 0001      Balance Exercises: Standing   Stepping Strategy Anterior;Posterior;Foam/compliant surface;10 reps    Stepping Strategy Limitations completed alternating anterior stepping off red balance beam x 10 reps, progresed to posterior stepping off red balance beam x 10 reps. All completed without UE support and demo good balance with completion overall.     Rockerboard Anterior/posterior;Lateral;EO;EC;Limitations    Rockerboard Limitations Standing on rocker board with it positioned ant/posterior completed the following: focus on holding steady with eyes open with PT providing manual perturbations in anterior/posterior direction x 3 minutes, with patient demo utilizing ankle/hip strategies appropriately throughout. With eyes closed on board ant/post, worked on holdings steady 3 x 30 seconds with patient tolerating well only requiring intermittent UE support. Progressed to board positioned laterally: focus on holding steady with eyes open and PT providing manual perturbation in lateral direction x 2 minutes with patient able to maintain balance. Progressed to holding rocker board steady  with eyes closed x 4 reps with patient tolerating well and able  to hold 15-20 seconds before having to open eyes or require UE support.     Tandem Gait Forward;Retro;Intermittent upper extremity support;Foam/compliant surface;Limitations    Tandem Gait Limitations completed on blue mat, completed x 4 reps down and back forwards in // bars with intermittent UE support. initially having difficulty but improved completion with reps. Progressed to completing retro tandem gait with intermittent UE support. Continue to rely on vision heavily with retro tandem gait.                PT Short Term Goals - 01/06/20 1024      PT SHORT TERM GOAL #1   Title Patient will demonstrate ability to ambulate >200 ft on grass surfaces with LRAD at Supervision level    Baseline supervision for 300 ft    Time 3    Period Weeks    Status Achieved    Target Date 01/04/20  PT SHORT TERM GOAL #2   Target Date 11/23/19             PT Long Term Goals - 12/14/19 0809      PT LONG TERM GOAL #1   Title Pt will be independent and demo compliance with Final balance/strengthening HEP    Baseline Continues to update HEP at this time    Time 6    Period Weeks    Status Revised    Target Date 01/25/20      PT LONG TERM GOAL #2   Title --    Baseline --    Time --    Period --    Status --      PT LONG TERM GOAL #3   Title Patient will improve FGA to >/= 24/30 to demonstrate reduced risk for falls    Baseline 17/30, 22/30 on 12/14/19    Time 6    Period Weeks    Status On-going    Target Date 01/25/20      PT LONG TERM GOAL #4   Title Patient will demonstrate ability to complete TUG <9 secs w/o AD to demonstrate improved mobility    Baseline 10.97, 9.69 secs on 12/14/19    Time 6    Period Weeks    Status On-going    Target Date 01/25/20      PT LONG TERM GOAL #5   Title Patient will demonstrate ability to ambulate > 200 ft on grass surfaces, Mod I, to demonstrate improved ability to  ambulate in yard    Baseline supervision on grass, <100 ft    Time 6    Period Weeks    Status New    Target Date 01/25/20                 Plan - 01/11/20 1453    Clinical Impression Statement Continued balance exercises with focus on complaint surfaces and rockerboard, with patient demo improved balance and use of appropriate balance strategies. Patient has demo significant progress with PT services and is expected to discharge at next visit.    Personal Factors and Comorbidities Comorbidity 3+    Comorbidities Obesity, hypertension, hyperlipidemia, Lt sciatic nerve pain, sleep apnea on CPAP    Examination-Activity Limitations Squat;Transfers;Stairs;Stand    Examination-Participation Restrictions Community Activity;Cleaning    Stability/Clinical Decision Making Evolving/Moderate complexity    Rehab Potential Good    PT Frequency 2x / week    PT Duration 2 weeks   followed by 1x/week for 4 weeks   PT Treatment/Interventions ADLs/Self Care Home Management;Moist Heat;Cryotherapy;Electrical Stimulation;DME Instruction;Gait training;Stair training;Functional mobility training;Therapeutic activities;Therapeutic exercise;Balance training;Neuromuscular re-education;Patient/family education;Orthotic Fit/Training;Passive range of motion    PT Next Visit Plan Check LTG/discharge. Update HEP    Consulted and Agree with Plan of Care Patient           Patient will benefit from skilled therapeutic intervention in order to improve the following deficits and impairments:  Abnormal gait, Decreased balance, Decreased endurance, Difficulty walking, Decreased activity tolerance, Decreased safety awareness, Pain, Postural dysfunction, Decreased strength  Visit Diagnosis: Muscle weakness (generalized)  Unsteadiness on feet  Other abnormalities of gait and mobility  Difficulty in walking, not elsewhere classified     Problem List Patient Active Problem List   Diagnosis Date Noted  .  Abdominal wall hernia 10/30/2019  . Change in voice 10/30/2019  . History of stroke 09/14/2019  . Spinal stenosis 07/24/2019  . Mixed hyperlipidemia   . OSA (obstructive sleep  apnea)   . Dysfunction of eustachian tube 05/15/2019  . Other social stressor 03/29/2019  . 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 Diarrhea 06/20/2018  . Joint pain 06/20/2018  . 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 Medicare annual wellness visit, subsequent 09/15/2017  . SUI (stress urinary incontinence, female) 08/07/2017  . Back pain 06/16/2016  . Advance care planning 01/07/2016  . Sleep apnea   . 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 Allergic rhinitis 11/24/2014  . Adult BMI 30+ 11/24/2014  . Depression 11/24/2014  . Insomnia 08/23/2009  . Headache, migraine 05/10/2009  . HERPES SIMPLEX INFECTION 08/14/2006  . HYPERCHOLESTEROLEMIA 08/14/2006  . Anxiety state 08/14/2006  . Essential hypertension 08/14/2006  . HIATAL HERNIA 08/14/2006  . IRRITABLE BOWEL SYNDROME 08/14/2006  . 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 Primary osteoarthritis of right knee 08/14/2006  . PLANTAR FASCIITIS 08/14/2006    Jones Bales, PT, DPT 01/11/2020, 2:54 PM  [facility] [address] [place], [place], [address] Phone: [phone]   Fax:  [phone]  Name: [name] [name] MRN: [id number] Date of Birth: [birth year]

## 2020-01-11 NOTE — Therapy (Signed)
[facility] [address] [place], [place], [address] Phone: [phone]   Fax:  [phone]  Speech Language Pathology Treatment  Patient Details  Name: [name] [name] MRN: [id number] Date of Birth: 13-Jun-1940 Referring Provider (SLP): Frann Rider, NP   Encounter Date: 01/11/2020   End of Session - 01/11/20 1308    Visit Number 12    Number of Visits 18    Date for SLP Re-Evaluation 02/17/20    SLP Start Time 1233    SLP Stop Time  1315    SLP Time Calculation (min) 42 min    Activity Tolerance Patient tolerated treatment well           Past Medical History:  Diagnosis Date  . Arthritis    knees  . Depression   . Fatty liver   . GERD (gastroesophageal reflux disease)   . Hyperlipidemia   . Hypertension   . IBS (irritable bowel syndrome)   . Obesity   . Pneumonia   . Sleep apnea    uses CPAP    Past Surgical History:  Procedure Laterality Date  . BLADDER SURGERY    . BREAST SURGERY     breast biopsy  . BROW LIFT Bilateral 04/14/2017   Procedure: BLEPHAROPLASTY UPPER EYELID WITH EXCESS SKIN;  Surgeon: Karle Starch, MD;  Location: [facility];  Service: Ophthalmology;  Laterality: Bilateral;  . CATARACT EXTRACTION W/PHACO Left 02/05/2016   Procedure: CATARACT EXTRACTION PHACO AND INTRAOCULAR LENS PLACEMENT (IOC);  Surgeon: Birder Robson, MD;  Location: [facility];  Service: Ophthalmology;  Laterality: Left;  [place] 01:10 AP% 22.3 CDE 15.67 Fluid pack lot # [id number]  . CATARACT EXTRACTION W/PHACO Right 02/26/2016   Procedure: CATARACT EXTRACTION PHACO AND INTRAOCULAR LENS PLACEMENT (IOC);  Surgeon: Birder Robson, MD;  Location: [facility];  Service: Ophthalmology;  Laterality: Right;  [place] 57.4 AP% 24.0 CDE 13.75 Fluid Pack lot # [id number]  . CHOLECYSTECTOMY    . COLONOSCOPY WITH PROPOFOL N/A 12/18/2014   Procedure: COLONOSCOPY WITH PROPOFOL;  Surgeon: Manya Silvas, MD;  [facility]  ENDOSCOPY;  Service: Endoscopy;  Laterality: N/A;  . DEEP NECK LYMPH NODE BIOPSY / EXCISION    . JOINT REPLACEMENT    . KNEE ARTHROPLASTY Left 06/20/2015   Procedure: COMPUTER ASSISTED TOTAL KNEE ARTHROPLASTY;  Surgeon: Dereck Leep, MD;  Location: [facility];  Service: Orthopedics;  Laterality: Left;  . PTOSIS REPAIR Bilateral 04/14/2017   Procedure: PTOSIS REPAIR RESECT EX;  Surgeon: Karle Starch, MD;  Location: [facility];  Service: Ophthalmology;  Laterality: Bilateral;  sleep apnea  . TONSILLECTOMY    . TOTAL HIP ARTHROPLASTY Right 07/21/2018   Procedure: TOTAL HIP ARTHROPLASTY ANTERIOR APPROACH;  Surgeon: Gaynelle Arabian, MD;  Location: [facility];  Service: Orthopedics;  Laterality: Right;    There were no vitals filed for this visit.   Subjective Assessment - 01/11/20 1236    Subjective "I think it's my allergies causing the phlegm"    Currently in Pain? No/denies                 ADULT SLP TREATMENT - 01/11/20 1239      General Information   Behavior/Cognition Alert;Cooperative;Pleasant mood      Treatment Provided   Treatment provided Cognitive-Linquistic      Cognitive-Linquistic Treatment   Treatment focused on Voice;Patient/family/caregiver education    Skilled Treatment Pt uses clear voice upon entering phonation. Olevia Bowens reports she is able to correct her voice  when she hears some hoarseness. In structured tasks focusing on breath support and flow phonation. Pt reqiured ongoing cues for breath. Clear phonation 75% of utterances in structured tasks. In conversation, she reqiured onoing cues for clear phonation      Assessment / Recommendations / Plan   Plan Continue with current plan of care      Progression Toward Goals   Progression toward goals Progressing toward goals            SLP Education - 01/11/20 1305    Education Details on going HEP and flow phonation    Person(s) Educated Patient    Methods Explanation    Comprehension Verbalized  understanding            SLP Short Term Goals - 01/11/20 1308      SLP SHORT TERM GOAL #1   Title Pt will complete HEP for labial weakness with mod I over 2 sessions    Time 2    Period Weeks    Status Achieved      SLP SHORT TERM GOAL #2   Title Pt will produce sibalant fricatives accurately at sentence level with rare min A 18/20 sentences    Time 2    Period Weeks    Status Achieved      SLP SHORT TERM GOAL #3   Title Pt will follow 3 reflux precautions over 3 sessions with mod I    Baseline ENT did not report reflux    Time 2    Period Weeks    Status Deferred      SLP SHORT TERM GOAL #4   Title Pt will follow 3 vocal hygiene strategies over 3 sessions with mod I    Baseline voice goals to be modified pending ENT consult    Time 1    Period Weeks    Status On-going      SLP SHORT TERM GOAL #5   Title pt to demo abdominal breathing at rest 75% success over 5 minutes    Time 3    Period Weeks    Status On-going      SLP SHORT TERM GOAL #6   Title pt to complete tasks mitigating muscle tension dysphonia with rare min A over three sessions    Time 2    Period Weeks    Status On-going            SLP Long Term Goals - 01/11/20 1308      SLP LONG TERM GOAL #1   Title Pt will produce sibalant fricatives accurately in simple conversation over 15 minutes, self corrected as needed, with occasional min A over 2 session    Time 3   all unmet LTGs renewed 01-06-20   Period Weeks    Status On-going      SLP LONG TERM GOAL #2   Title Pt will achieve WNL phonation using strategies 15/20 sentences with occasional min A    Baseline may be modified following ENT consult    Time 3    Period Weeks    Status On-going      SLP LONG TERM GOAL #3   Title Pt will follow 4 vocal hygiene strategies over 3 sessions with rare min A    Time 3    Period Weeks    Status On-going      SLP LONG TERM GOAL #4   Title Pt will improve Communicative Effectiveness Survey score by 2  points  Time 4    Period Weeks    Status On-going      SLP LONG TERM GOAL #5   Title pt to use WNL voice quality in 8 minutes simple conversation x3 sessions    Time 3    Period Weeks    Status On-going      SLP LONG TERM GOAL #6   Title pt to demo PHorTE exercises with rare min A in 3 sessions    Time 3    Period Weeks    Status On-going            Plan - 01/11/20 1306    Clinical Impression Statement Ongoing training for HEP, 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 Pt continues with suboptimal compliance with her voice HEP and SLP reiterated rationale for HEP and daily performance of HEP to pt today. Elisea is achieving caryover of clear phonation after SOVT tasks for 10 seconds and then reverts back to harsh/pressed voice. IN structured tasks, she has less success than she did 2-3 weeks ago. Skilled ST remains necessary to habitualize WNL vocal quality.    Speech Therapy Frequency 2x / week    Duration --   4 additional weeks or 18 visits   Treatment/Interventions Aspiration precaution training;Environmental controls;Cueing hierarchy;Oral motor exercises;SLP instruction and feedback;Compensatory strategies;Functional tasks;Cognitive reorganization;Compensatory techniques;Diet toleration management by SLP;Internal/external aids;Multimodal communcation approach;Patient/family education    Potential to Achieve Goals Good           Patient will benefit from skilled therapeutic intervention in order to improve the following deficits and impairments:   Other voice and resonance disorders    Problem List Patient Active Problem List   Diagnosis Date Noted  . Abdominal wall hernia 10/30/2019  . Change in voice 10/30/2019  . History of stroke 09/14/2019  . Spinal stenosis 07/24/2019  . Mixed hyperlipidemia   . OSA (obstructive sleep apnea)   . Dysfunction of eustachian tube 05/15/2019  . Other social stressor 03/29/2019  . OA (osteoarthritis) of hip  07/21/2018  . Diarrhea 06/20/2018  . Joint pain 06/20/2018  . 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 Medicare annual wellness visit, subsequent 09/15/2017  . SUI (stress urinary incontinence, female) 08/07/2017  . Back pain 06/16/2016  . Advance care planning 01/07/2016  . Sleep apnea   . 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 Allergic rhinitis 11/24/2014  . Adult BMI 30+ 11/24/2014  . Depression 11/24/2014  . Insomnia 08/23/2009  . Headache, migraine 05/10/2009  . HERPES SIMPLEX INFECTION 08/14/2006  . HYPERCHOLESTEROLEMIA 08/14/2006  . Anxiety state 08/14/2006  . Essential hypertension 08/14/2006  . HIATAL HERNIA 08/14/2006  . IRRITABLE BOWEL SYNDROME 08/14/2006  . 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 Primary osteoarthritis of right knee 08/14/2006  . PLANTAR FASCIITIS 08/14/2006    Makahla Kiser, Annye Rusk MS, CCC-SLP 01/11/2020, 1:09 PM  [facility] [address] [place], [place], [address] Phone: [phone]   Fax:  [phone]   Name: BRITANNI YARDE MRN: [id number] Date of Birth: [birth year]

## 2020-01-12 LAB — HM MAMMOGRAPHY

## 2020-01-18 ENCOUNTER — Other Ambulatory Visit: Payer: Self-pay

## 2020-01-18 ENCOUNTER — Ambulatory Visit: Payer: Medicare HMO

## 2020-01-18 ENCOUNTER — Ambulatory Visit: Payer: Medicare HMO | Admitting: Occupational Therapy

## 2020-01-18 ENCOUNTER — Encounter: Payer: Self-pay | Admitting: Speech Pathology

## 2020-01-18 ENCOUNTER — Ambulatory Visit: Payer: Medicare HMO | Admitting: Speech Pathology

## 2020-01-18 DIAGNOSIS — R2689 Other abnormalities of gait and mobility: Secondary | ICD-10-CM | POA: Diagnosis not present

## 2020-01-18 DIAGNOSIS — R262 Difficulty in walking, not elsewhere classified: Secondary | ICD-10-CM | POA: Diagnosis not present

## 2020-01-18 DIAGNOSIS — R2681 Unsteadiness on feet: Secondary | ICD-10-CM | POA: Diagnosis not present

## 2020-01-18 DIAGNOSIS — M6281 Muscle weakness (generalized): Secondary | ICD-10-CM

## 2020-01-18 DIAGNOSIS — R278 Other lack of coordination: Secondary | ICD-10-CM

## 2020-01-18 DIAGNOSIS — R471 Dysarthria and anarthria: Secondary | ICD-10-CM | POA: Diagnosis not present

## 2020-01-18 DIAGNOSIS — R498 Other voice and resonance disorders: Secondary | ICD-10-CM | POA: Diagnosis not present

## 2020-01-18 NOTE — Therapy (Signed)
[name] [name] [address] [name] [name] [name] [name], [place], [address] Phone: [phone]   Fax:  [phone]  Speech Language Pathology Treatment  Patient Details  Name: [name] [name] MRN: [id number] Date of Birth: January 24, 1941 Referring Provider (SLP): Frann Rider, NP   Encounter Date: 01/18/2020   End of Session - 01/18/20 1356    Visit Number 13    Number of Visits 18    Date for SLP Re-Evaluation 02/17/20    SLP Start Time 53    SLP Stop Time  1310    SLP Time Calculation (min) 39 min           Past Medical History:  Diagnosis Date  . Arthritis    knees  . Depression   . Fatty liver   . GERD (gastroesophageal reflux disease)   . Hyperlipidemia   . Hypertension   . IBS (irritable bowel syndrome)   . Obesity   . Pneumonia   . Sleep apnea    uses CPAP    Past Surgical History:  Procedure Laterality Date  . BLADDER SURGERY    . BREAST SURGERY     breast biopsy  . BROW LIFT Bilateral 04/14/2017   Procedure: BLEPHAROPLASTY UPPER EYELID WITH EXCESS SKIN;  Surgeon: Karle Starch, MD;  Location: [facility];  Service: Ophthalmology;  Laterality: Bilateral;  . CATARACT EXTRACTION W/PHACO Left 02/05/2016   Procedure: CATARACT EXTRACTION PHACO AND INTRAOCULAR LENS PLACEMENT (IOC);  Surgeon: Birder Robson, MD;  Location: [facility];  Service: Ophthalmology;  Laterality: Left;  [place] 01:10 AP% 22.3 CDE 15.67 Fluid pack lot # [id number]  . CATARACT EXTRACTION W/PHACO Right 02/26/2016   Procedure: CATARACT EXTRACTION PHACO AND INTRAOCULAR LENS PLACEMENT (IOC);  Surgeon: Birder Robson, MD;  Location: [facility];  Service: Ophthalmology;  Laterality: Right;  [place] 57.4 AP% 24.0 CDE 13.75 Fluid Pack lot # [id number]  . CHOLECYSTECTOMY    . COLONOSCOPY WITH PROPOFOL N/A 12/18/2014   Procedure: COLONOSCOPY WITH PROPOFOL;  Surgeon: Manya Silvas, MD;  [facility];  Service: Endoscopy;  Laterality: N/A;  . DEEP  NECK LYMPH NODE BIOPSY / EXCISION    . JOINT REPLACEMENT    . KNEE ARTHROPLASTY Left 06/20/2015   Procedure: COMPUTER ASSISTED TOTAL KNEE ARTHROPLASTY;  Surgeon: Dereck Leep, MD;  Location: [facility];  Service: Orthopedics;  Laterality: Left;  . PTOSIS REPAIR Bilateral 04/14/2017   Procedure: PTOSIS REPAIR RESECT EX;  Surgeon: Karle Starch, MD;  Location: [place];  Service: Ophthalmology;  Laterality: Bilateral;  sleep apnea  . TONSILLECTOMY    . TOTAL HIP ARTHROPLASTY Right 07/21/2018   Procedure: TOTAL HIP ARTHROPLASTY ANTERIOR APPROACH;  Surgeon: Gaynelle Arabian, MD;  Location: [facility];  Service: Orthopedics;  Laterality: Right;    There were no vitals filed for this visit.   Subjective Assessment - 01/18/20 1241    Subjective "I have this drainage"    Currently in Pain? No/denies                 ADULT SLP TREATMENT - 01/18/20 1243      General Information   Behavior/Cognition Alert;Cooperative;Pleasant mood      Treatment Provided   Treatment provided Cognitive-Linquistic      Cognitive-Linquistic Treatment   Treatment focused on Voice;Patient/family/caregiver education    Skilled Treatment Pt continues to complete HEP and reports she can control her voice to be clear "when I'm conscious of it" Today, she demonstrates adequate breath support and awareness of  glottal fry with rare min A. [name] [name] carried over clear phonation. PHORTE deferred due to MTD. [name] [name] is pleased with her current level of function. Re-adminstered Communicative Effectiveness Survey - pt improved from score of 10 to 30. She is aware to continue SOVTE prior to events where she will be speaking longer periods of time.       Assessment / Recommendations / Plan   Plan Discharge SLP treatment due to (comment)      Progression Toward Goals   Progression toward goals Goals met, education completed, patient discharged from [facility] - 01/18/20 1353    Education Details  continue HEP for voice    Person(s) Educated Patient    Methods Explanation    Comprehension Verbalized understanding;Returned demonstration          SPEECH THERAPY DISCHARGE SUMMARY  Visits from Start of Care: 14  Current functional level related to goals / functional outcomes: See goals below   Remaining deficits: Dysphonia mld   Education / Equipment: HEP for voice, vocal hygiene  Plan: Patient agrees to discharge.  Patient goals were met. Patient is being discharged due to meeting the stated rehab goals.  ?????          SLP Short Term Goals - 01/18/20 1355      SLP SHORT TERM GOAL #1   Title Pt will complete HEP for labial weakness with mod I over 2 sessions    Time 2    Period Weeks    Status Achieved      SLP SHORT TERM GOAL #2   Title Pt will produce sibalant fricatives accurately at sentence level with rare min A 18/20 sentences    Time 2    Period Weeks    Status Achieved      SLP SHORT TERM GOAL #3   Title Pt will follow 3 reflux precautions over 3 sessions with mod I    Baseline ENT did not report reflux    Time 2    Period Weeks    Status Deferred      SLP SHORT TERM GOAL #4   Title Pt will follow 3 vocal hygiene strategies over 3 sessions with mod I    Baseline voice goals to be modified pending ENT consult    Time 1    Period Weeks    Status Achieved      SLP SHORT TERM GOAL #5   Title pt to demo abdominal breathing at rest 75% success over 5 minutes    Time 3    Period Weeks    Status On-going      SLP SHORT TERM GOAL #6   Title pt to complete tasks mitigating muscle tension dysphonia with rare min A over three sessions    Time 2    Period Weeks    Status On-going            SLP Long Term Goals - 01/18/20 1355      SLP LONG TERM GOAL #1   Title Pt will produce sibalant fricatives accurately in simple conversation over 15 minutes, self corrected as needed, with occasional min A over 2 session    Time 3   all unmet LTGs renewed  01-06-20   Period Weeks    Status Achieved      SLP LONG TERM GOAL #2   Title Pt will achieve WNL phonation using strategies 15/20 sentences with occasional min  A    Baseline may be modified following ENT consult    Time 3    Period Weeks    Status On-going      SLP LONG TERM GOAL #3   Title Pt will follow 4 vocal hygiene strategies over 3 sessions with rare min A    Time 3    Period Weeks    Status On-going      SLP LONG TERM GOAL #4   Title Pt will improve Communicative Effectiveness Survey score by 2 points    Time 4    Period Weeks    Status Achieved      SLP LONG TERM GOAL #5   Title pt to use WNL voice quality in 8 minutes simple conversation x3 sessions    Time 3    Period Weeks    Status Achieved      SLP LONG TERM GOAL #6   Title pt to demo PHorTE exercises with rare min A in 3 sessions    Time 3    Period Weeks    Status Deferred            Plan - 01/18/20 1353    Clinical Impression Statement [name] [name] is completing HEP for voice with mod I. She is correcting her voice when she notes hoarseness and is achieving clear phonation. Duha is pleased with her current level of function - d/c ST at this time. I am in agreement    Speech Therapy Frequency 2x / week    Duration --   12 weeks, 24 visits   Treatment/Interventions Aspiration precaution training;Environmental controls;Cueing hierarchy;Oral motor exercises;SLP instruction and feedback;Compensatory strategies;Functional tasks;Cognitive reorganization;Compensatory techniques;Diet toleration management by SLP;Internal/external aids;Multimodal communcation approach;Patient/family education    Potential to Achieve Goals Good           Patient will benefit from skilled therapeutic intervention in order to improve the following deficits and impairments:   Other voice and resonance disorders    Problem List Patient Active Problem List   Diagnosis Date Noted  . Abdominal wall hernia 10/30/2019  . Change in  voice 10/30/2019  . History of stroke 09/14/2019  . Spinal stenosis 07/24/2019  . Mixed hyperlipidemia   . OSA (obstructive sleep apnea)   . Dysfunction of eustachian tube 05/15/2019  . Other social stressor 03/29/2019  . 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 Diarrhea 06/20/2018  . Joint pain 06/20/2018  . 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 Medicare annual wellness visit, subsequent 09/15/2017  . SUI (stress urinary incontinence, female) 08/07/2017  . Back pain 06/16/2016  . Advance care planning 01/07/2016  . Sleep apnea   . 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 Allergic rhinitis 11/24/2014  . Adult BMI 30+ 11/24/2014  . Depression 11/24/2014  . Insomnia 08/23/2009  . Headache, migraine 05/10/2009  . HERPES SIMPLEX INFECTION 08/14/2006  . HYPERCHOLESTEROLEMIA 08/14/2006  . Anxiety state 08/14/2006  . Essential hypertension 08/14/2006  . HIATAL HERNIA 08/14/2006  . IRRITABLE BOWEL SYNDROME 08/14/2006  . 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 Primary osteoarthritis of right knee 08/14/2006  . PLANTAR FASCIITIS 08/14/2006    ,  Ann MS, CCC-SLP 01/18/2020, 1:57 PM  [facility] [address] [place], [place], [address] Phone: [phone]   Fax:  [phone]   Name: Clay C Bosch MRN: [id number] Date of Birth: 05/21/1941 

## 2020-01-18 NOTE — Therapy (Signed)
[facility] [address] [place], [place], [address] Phone: [phone]   Fax:  [phone]  Physical Therapy Treatment/Discharge Summary  Patient Details  Name: [name] [name] MRN: [id number] Date of Birth: 12-10-1940 Referring Provider (PT): Referred by Frann Rider, NP, PCP is Elsie Stain, MD   PHYSICAL THERAPY DISCHARGE SUMMARY  Visits from Start of Care: 17  Current functional level related to goals / functional outcomes: See Clinical Impression Statement   Remaining deficits: Low Fall Risk. Pain in Knees   Education / Equipment: Educated on [employer] Update  Plan: Patient agrees to discharge.  Patient goals were met. Patient is being discharged due to meeting the stated rehab goals.  ?????       Encounter Date: 01/18/2020   PT End of Session - 01/18/20 1233    Visit Number 17    Number of Visits 18    Date for PT Re-Evaluation 02/12/20   updated POC for 6 weeks, cert for 60 days   Authorization Type Medicare (10th visit PN)    PT Start Time 1145    PT Stop Time 1223    PT Time Calculation (min) 38 min    Equipment Utilized During Treatment Gait belt    Activity Tolerance Patient tolerated treatment well    Behavior During Therapy WFL for tasks assessed/performed           Past Medical History:  Diagnosis Date  . Arthritis    knees  . Depression   . Fatty liver   . GERD (gastroesophageal reflux disease)   . Hyperlipidemia   . Hypertension   . IBS (irritable bowel syndrome)   . Obesity   . Pneumonia   . Sleep apnea    uses CPAP    Past Surgical History:  Procedure Laterality Date  . BLADDER SURGERY    . BREAST SURGERY     breast biopsy  . BROW LIFT Bilateral 04/14/2017   Procedure: BLEPHAROPLASTY UPPER EYELID WITH EXCESS SKIN;  Surgeon: Karle Starch, MD;  Location: [facility];  Service: Ophthalmology;  Laterality: Bilateral;  . CATARACT EXTRACTION W/PHACO Left 02/05/2016    Procedure: CATARACT EXTRACTION PHACO AND INTRAOCULAR LENS PLACEMENT (IOC);  Surgeon: Birder Robson, MD;  Location: [facility];  Service: Ophthalmology;  Laterality: Left;  [place] 01:10 AP% 22.3 CDE 15.67 Fluid pack lot # [id number]  . CATARACT EXTRACTION W/PHACO Right 02/26/2016   Procedure: CATARACT EXTRACTION PHACO AND INTRAOCULAR LENS PLACEMENT (IOC);  Surgeon: Birder Robson, MD;  Location: [facility];  Service: Ophthalmology;  Laterality: Right;  [place] 57.4 AP% 24.0 CDE 13.75 Fluid Pack lot # [id number]  . CHOLECYSTECTOMY    . COLONOSCOPY WITH PROPOFOL N/A 12/18/2014   Procedure: COLONOSCOPY WITH PROPOFOL;  Surgeon: Manya Silvas, MD;  Location: [facility];  Service: Endoscopy;  Laterality: N/A;  . DEEP NECK LYMPH NODE BIOPSY / EXCISION    . JOINT REPLACEMENT    . KNEE ARTHROPLASTY Left 06/20/2015   Procedure: COMPUTER ASSISTED TOTAL KNEE ARTHROPLASTY;  Surgeon: Dereck Leep, MD;  Location: [facility];  Service: Orthopedics;  Laterality: Left;  . PTOSIS REPAIR Bilateral 04/14/2017   Procedure: PTOSIS REPAIR RESECT EX;  Surgeon: Karle Starch, MD;  Location: [facility];  Service: Ophthalmology;  Laterality: Bilateral;  sleep apnea  . TONSILLECTOMY    . TOTAL HIP ARTHROPLASTY Right 07/21/2018   Procedure: TOTAL HIP ARTHROPLASTY ANTERIOR APPROACH;  Surgeon: Gaynelle Arabian, MD;  Location: [facility];  Service: Orthopedics;  Laterality:  Right;    There were no vitals filed for this visit.   Subjective Assessment - 01/18/20 1149    Subjective Patient reports no new changes/complaints. Reports feels balance is better.    Patient is accompained by: Family member   cousin   Pertinent History Obesity, hypertension, hyperlipidemia, Lt sciatic nerve pain, sleep apnea on CPAP.    Limitations Walking;Standing;Sitting    Patient Stated Goals Feel more like myself    Currently in Pain? No/denies              [facility] PT Assessment - 01/18/20 0001      Functional Gait  Assessment   Gait  assessed  Yes    Gait Level Surface Walks 20 ft in less than 5.5 sec, no assistive devices, good speed, no evidence for imbalance, normal gait pattern, deviates no more than 6 in outside of the 12 in walkway width.    Change in Gait Speed Able to smoothly change walking speed without loss of balance or gait deviation. Deviate no more than 6 in outside of the 12 in walkway width.    Gait with Horizontal Head Turns Performs head turns smoothly with no change in gait. Deviates no more than 6 in outside 12 in walkway width    Gait with Vertical Head Turns Performs head turns with no change in gait. Deviates no more than 6 in outside 12 in walkway width.    Gait and Pivot Turn Pivot turns safely within 3 sec and stops quickly with no loss of balance.    Step Over Obstacle Is able to step over one shoe box (4.5 in total height) without changing gait speed. No evidence of imbalance.    Gait with Narrow Base of Support Ambulates 7-9 steps.    Gait with Eyes Closed Walks 20 ft, no assistive devices, good speed, no evidence of imbalance, normal gait pattern, deviates no more than 6 in outside 12 in walkway width. Ambulates 20 ft in less than 7 sec.    Ambulating Backwards Walks 20 ft, no assistive devices, good speed, no evidence for imbalance, normal gait    Steps Alternating feet, must use rail.    Total Score 27    FGA comment: 27/30 = Low Fall Risk                         [facility] Adult PT Treatment/Exercise - 01/18/20 0001      Transfers   Transfers Sit to Stand;Stand to Sit    Sit to Stand 6: Modified independent (Device/Increase time);With upper extremity assist    Stand to Sit 6: Modified independent (Device/Increase time);With upper extremity assist      Ambulation/Gait   Ambulation/Gait Yes    Ambulation/Gait Assistance 6: Modified independent (Device/Increase time)    Ambulation/Gait Assistance Details compelted gait on outdoor surfaces including grass/pavement x 300 ft, Mod I  level. No instances of instability noted. Patient reports feeling more comfortable ambulating across unlevel surfaces.     Ambulation Distance (Feet) 300 Feet    Assistive device None    Gait Pattern Step-through pattern;Decreased arm swing - right;Decreased arm swing - left;Decreased dorsiflexion - right;Decreased dorsiflexion - left;Poor foot clearance - left;Ataxic    Ambulation Surface Unlevel;Outdoor;Grass;Paved      Standardized Balance Assessment   Standardized Balance Assessment Timed Up and Go Test      Timed Up and Go Test   TUG Normal TUG    Normal TUG (seconds)  7.82      Exercises   Exercises Other Exercises    Other Exercises  Reviewed entirety of currrent HEP, providing patient with potential progressions upon discharge. Educated on completion of HEP to maintain gains achieved with PT services.                   PT Education - 01/18/20 1236    Education Details Progress toward LTG; Continue to complete HEP upon discharge    Person(s) Educated Patient    Methods Explanation    Comprehension Verbalized understanding            PT Short Term Goals - 01/06/20 1024      PT SHORT TERM GOAL #1   Title Patient will demonstrate ability to ambulate >200 ft on grass surfaces with LRAD at Supervision level    Baseline supervision for 300 ft    Time 3    Period Weeks    Status Achieved    Target Date 01/04/20      PT SHORT TERM GOAL #2   Target Date 11/23/19             PT Long Term Goals - 01/18/20 1155      PT LONG TERM GOAL #1   Title Pt will be independent and demo compliance with Final balance/strengthening HEP    Baseline Independent with HEP    Time 6    Period Weeks    Status Achieved      PT LONG TERM GOAL #3   Title Patient will improve FGA to >/= 24/30 to demonstrate reduced risk for falls    Baseline 17/30, 22/30 on 12/14/19, 27/30 on 8/25    Time 6    Period Weeks    Status Achieved      PT LONG TERM GOAL #4   Title Patient will  demonstrate ability to complete TUG <9 secs w/o AD to demonstrate improved mobility    Baseline 10.97, 9.69 secs on 12/14/19, 7.82 secs w/o AD    Time 6    Period Weeks    Status Achieved      PT LONG TERM GOAL #5   Title Patient will demonstrate ability to ambulate > 200 ft on grass surfaces, Mod I, to demonstrate improved ability to ambulate in yard    Baseline Mod I on Grass Surfaces, 300 ft    Time 6    Period Weeks    Status Achieved                 Plan - 01/18/20 1234    Clinical Impression Statement Today's skilled PT session included assessment of patient's progress toward all LTG. Patient able to meet all LTG during today's session, and demonstrate significant progress with PT services and demo improved balance and functional mobility. PT verbalizing readiness for discharge, with pt verbalizing agreement.    Personal Factors and Comorbidities Comorbidity 3+    Comorbidities Obesity, hypertension, hyperlipidemia, Lt sciatic nerve pain, sleep apnea on CPAP    Examination-Activity Limitations Squat;Transfers;Stairs;Stand    Examination-Participation Restrictions Community Activity;Cleaning    Stability/Clinical Decision Making Evolving/Moderate complexity    Rehab Potential Good    PT Frequency 2x / week    PT Duration 2 weeks   followed by 1x/week for 4 weeks   PT Treatment/Interventions ADLs/Self Care Home Management;Moist Heat;Cryotherapy;Electrical Stimulation;DME Instruction;Gait training;Stair training;Functional mobility training;Therapeutic activities;Therapeutic exercise;Balance training;Neuromuscular re-education;Patient/family education;Orthotic Fit/Training;Passive range of motion    Consulted and Agree with Plan of Care  Patient           Patient will benefit from skilled therapeutic intervention in order to improve the following deficits and impairments:  Abnormal gait, Decreased balance, Decreased endurance, Difficulty walking, Decreased activity tolerance,  Decreased safety awareness, Pain, Postural dysfunction, Decreased strength  Visit Diagnosis: Muscle weakness (generalized)  Unsteadiness on feet  Other abnormalities of gait and mobility  Difficulty in walking, not elsewhere classified     Problem List Patient Active Problem List   Diagnosis Date Noted  . Abdominal wall hernia 10/30/2019  . Change in voice 10/30/2019  . History of stroke 09/14/2019  . Spinal stenosis 07/24/2019  . Mixed hyperlipidemia   . OSA (obstructive sleep apnea)   . Dysfunction of eustachian tube 05/15/2019  . Other social stressor 03/29/2019  . 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 Diarrhea 06/20/2018  . Joint pain 06/20/2018  . 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 Medicare annual wellness visit, subsequent 09/15/2017  . SUI (stress urinary incontinence, female) 08/07/2017  . Back pain 06/16/2016  . Advance care planning 01/07/2016  . Sleep apnea   . 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 Allergic rhinitis 11/24/2014  . Adult BMI 30+ 11/24/2014  . Depression 11/24/2014  . Insomnia 08/23/2009  . Headache, migraine 05/10/2009  . HERPES SIMPLEX INFECTION 08/14/2006  . HYPERCHOLESTEROLEMIA 08/14/2006  . Anxiety state 08/14/2006  . Essential hypertension 08/14/2006  . HIATAL HERNIA 08/14/2006  . IRRITABLE BOWEL SYNDROME 08/14/2006  . 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 Primary osteoarthritis of right knee 08/14/2006  . PLANTAR FASCIITIS 08/14/2006    Jones Bales, PT, DPT 01/18/2020, 12:37 PM  [facility] [address] [place], [place], [address] Phone: [phone]   Fax:  [phone]  Name: [name] [name] MRN: [id number] Date of Birth: 1940-08-03

## 2020-01-18 NOTE — Patient Instructions (Signed)
   Continue straw and gargle exercises  Especially before you talk - like go out to lunch, club, or talk on phone do the exercises  When you hear fry, take a big breath and power your voice

## 2020-01-18 NOTE — Therapy (Signed)
[facility] [address] [place], [place], [address] Phone: [phone]   Fax:  [phone]  Occupational Therapy Treatment  Patient Details  Name: [name] [name] MRN: [id number] Date of Birth: Aug 22, 1940 Referring Provider (OT): Frann Rider, NP   Encounter Date: 01/18/2020   OT End of Session - 01/18/20 1207    Visit Number 3    Number of Visits 9    Authorization Type [employer]    OT Start Time 1100    OT Stop Time 1140    OT Time Calculation (min) 40 min    Activity Tolerance Patient tolerated treatment well           Past Medical History:  Diagnosis Date  . Arthritis    knees  . Depression   . Fatty liver   . GERD (gastroesophageal reflux disease)   . Hyperlipidemia   . Hypertension   . IBS (irritable bowel syndrome)   . Obesity   . Pneumonia   . Sleep apnea    uses CPAP    Past Surgical History:  Procedure Laterality Date  . BLADDER SURGERY    . BREAST SURGERY     breast biopsy  . BROW LIFT Bilateral 04/14/2017   Procedure: BLEPHAROPLASTY UPPER EYELID WITH EXCESS SKIN;  Surgeon: Karle Starch, MD;  Location: [facility];  Service: Ophthalmology;  Laterality: Bilateral;  . CATARACT EXTRACTION W/PHACO Left 02/05/2016   Procedure: CATARACT EXTRACTION PHACO AND INTRAOCULAR LENS PLACEMENT (IOC);  Surgeon: Birder Robson, MD;  Location: [facility];  Service: Ophthalmology;  Laterality: Left;  [place] 01:10 AP% 22.3 CDE 15.67 Fluid pack lot # [id number]  . CATARACT EXTRACTION W/PHACO Right 02/26/2016   Procedure: CATARACT EXTRACTION PHACO AND INTRAOCULAR LENS PLACEMENT (IOC);  Surgeon: Birder Robson, MD;  Location: [facility];  Service: Ophthalmology;  Laterality: Right;  [place] 57.4 AP% 24.0 CDE 13.75 Fluid Pack lot # [id number]  . CHOLECYSTECTOMY    . COLONOSCOPY WITH PROPOFOL N/A 12/18/2014   Procedure: COLONOSCOPY WITH PROPOFOL;  Surgeon: Manya Silvas, MD;  Location: [facility];   Service: Endoscopy;  Laterality: N/A;  . DEEP NECK LYMPH NODE BIOPSY / EXCISION    . JOINT REPLACEMENT    . KNEE ARTHROPLASTY Left 06/20/2015   Procedure: COMPUTER ASSISTED TOTAL KNEE ARTHROPLASTY;  Surgeon: Dereck Leep, MD;  Location: [facility];  Service: Orthopedics;  Laterality: Left;  . PTOSIS REPAIR Bilateral 04/14/2017   Procedure: PTOSIS REPAIR RESECT EX;  Surgeon: Karle Starch, MD;  Location: [facility];  Service: Ophthalmology;  Laterality: Bilateral;  sleep apnea  . TONSILLECTOMY    . TOTAL HIP ARTHROPLASTY Right 07/21/2018   Procedure: TOTAL HIP ARTHROPLASTY ANTERIOR APPROACH;  Surgeon: Gaynelle Arabian, MD;  Location: [facility];  Service: Orthopedics;  Laterality: Right;    There were no vitals filed for this visit.   Subjective Assessment - 01/18/20 1110    Subjective  I'm dropping less in my Lt hand    Pertinent History Patient is a 79 y.o. female referred to Neuro OPOT due to CVA that occurred in 06/2019. Patient PMH is significant for: Obesity, hypertension, hyperlipidemia, Lt sciatic nerve pain, and sleep apnea on CPAP.    Patient Stated Goals improve coordination    Currently in Pain? No/denies              [facility] OT Assessment - 01/18/20 0001      Coordination   Left 9 Hole Peg Test 32.62 sec  Practiced carrying cup of water and plate of "food" in both hands (then switched hands) with no drops/spills. Discussed progress and assessed goals.   Clarified coordination HEP and also clarified theraband HEP as pt reports "popping" in shoulder w/ flexion ex - pt had been doing wrong; once corrected pt had no popping in joint.                       OT Long Term Goals - 01/18/20 1208      OT LONG TERM GOAL #1   Title I with HEP for coordination and UE strength    Time 4    Period Weeks    Status Achieved      OT LONG TERM GOAL #2   Title Pt will demonstrate improved fine motor coordination  for ADLs as evidenced by decreasing 9  hole peg test score by 5 secs.    Time 4    Period Weeks    Status Achieved   32.62 sec     OT LONG TERM GOAL #3   Title Pt will demonstrate ability to carry a plate in 1 hand and a beverage in the other hand 125 ' without drops or spills.    Time 4    Period Weeks    Status Achieved      OT LONG TERM GOAL #4   Title Pt will report that she is dropping items less during ADLs/IADLs.    Time 4    Period Weeks    Status Achieved                 Plan - 01/18/20 1209    Clinical Impression Statement Pt has met all LTG's at this time.    OT Occupational Profile and History Problem Focused Assessment - Including review of records relating to presenting problem    Occupational performance deficits (Please refer to evaluation for details): ADL's;IADL's;Leisure;Play;Social Participation    Body Structure / Function / Physical Skills ADL;Balance;Endurance;Strength;UE functional use;FMC;Gait;Coordination;ROM;GMC;Decreased knowledge of use of DME;Dexterity;IADL    Rehab Potential Good    Clinical Decision Making Limited treatment options, no task modification necessary    Comorbidities Affecting Occupational Performance: May have comorbidities impacting occupational performance    Modification or Assistance to Complete Evaluation  No modification of tasks or assist necessary to complete eval    OT Frequency 1x / week    OT Duration 4 weeks   4 weeks plus eval   OT Treatment/Interventions Self-care/ADL training;Therapeutic exercise;Balance training;Manual Therapy;Neuromuscular education;Ultrasound;Therapeutic activities;DME and/or AE instruction;Paraffin;Cryotherapy;Fluidtherapy;Patient/family education;Passive range of motion;Moist Heat;Contrast Bath    Plan D/C O.Darene Lamer    Consulted and Agree with Plan of Care Patient           Patient will benefit from skilled therapeutic intervention in order to improve the following deficits and impairments:   Body Structure / Function / Physical  Skills: ADL, Balance, Endurance, Strength, UE functional use, FMC, Gait, Coordination, ROM, GMC, Decreased knowledge of use of DME, Dexterity, IADL       Visit Diagnosis: Other lack of coordination  Muscle weakness (generalized)  Unsteadiness on feet    Problem List Patient Active Problem List   Diagnosis Date Noted  . Abdominal wall hernia 10/30/2019  . Change in voice 10/30/2019  . History of stroke 09/14/2019  . Spinal stenosis 07/24/2019  . Mixed hyperlipidemia   . OSA (obstructive sleep apnea)   . Dysfunction of eustachian tube 05/15/2019  . Other social stressor 03/29/2019  .  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 Diarrhea 06/20/2018  . Joint pain 06/20/2018  . 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 Medicare annual wellness visit, subsequent 09/15/2017  . SUI (stress urinary incontinence, female) 08/07/2017  . Back pain 06/16/2016  . Advance care planning 01/07/2016  . Sleep apnea   . 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 Allergic rhinitis 11/24/2014  . Adult BMI 30+ 11/24/2014  . Depression 11/24/2014  . Insomnia 08/23/2009  . Headache, migraine 05/10/2009  . HERPES SIMPLEX INFECTION 08/14/2006  . HYPERCHOLESTEROLEMIA 08/14/2006  . Anxiety state 08/14/2006  . Essential hypertension 08/14/2006  . HIATAL HERNIA 08/14/2006  . IRRITABLE BOWEL SYNDROME 08/14/2006  . 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 Primary osteoarthritis of right knee 08/14/2006  . PLANTAR FASCIITIS 08/14/2006   OCCUPATIONAL THERAPY DISCHARGE SUMMARY  Visits from Start of Care: 3  Current functional level related to goals / functional outcomes: See above   Remaining deficits: Decreased endurance   Education / Equipment: HEP's  Plan: Patient agrees to discharge.  Patient goals were met. Patient is being discharged due to meeting the stated rehab goals.  ?????        Carey Bullocks, OTR/L 01/18/2020, 12:10 PM  [facility] [address] [place], [place], [address] Phone: [phone]   Fax:  [phone]  Name: [name] [name] MRN: [id number] Date of Birth: [birth year]

## 2020-01-23 DIAGNOSIS — N958 Other specified menopausal and perimenopausal disorders: Secondary | ICD-10-CM | POA: Diagnosis not present

## 2020-01-23 LAB — HM DEXA SCAN

## 2020-01-25 ENCOUNTER — Ambulatory Visit: Payer: Medicare HMO | Admitting: Speech Pathology

## 2020-02-02 NOTE — Progress Notes (Signed)
I returned the patient's call about her concerns about did she need a Covid vaccine booster.  She did not pick up the phone and left a message on the answering machine to call me back if she needs to discuss it further.  I recommend she check antibody titers and if antibody levels are low she would benefit with a booster.

## 2020-02-08 ENCOUNTER — Encounter: Payer: Medicare HMO | Admitting: Speech Pathology

## 2020-02-15 ENCOUNTER — Telehealth: Payer: Self-pay | Admitting: *Deleted

## 2020-02-15 ENCOUNTER — Other Ambulatory Visit: Payer: Medicare HMO

## 2020-02-15 DIAGNOSIS — Z20822 Contact with and (suspected) exposure to covid-19: Secondary | ICD-10-CM

## 2020-02-15 NOTE — Telephone Encounter (Signed)
Patient left a voicemail stating that she thinks that she is having seasonal allergies. Patient stated that her nose is running constantly and she has taken everything that she can think of. Patient wants some advice as to what else she should do. Called patient back and got her voicemail. Left a message for patient to call back. Need to be screened for there symptoms. Patient called back and stated that she has been on the phone with the pharmacist at [facility] and they advised her that she should get tested for covid. Patient stated that she is sneezing and has a cough from the sinus drainage. Patient denies fever, SOB or difficulty breathing. Patient stated that her son and his family has covid but she hasn't been around them since labor day. Patient was advised that she should be tested and she verbalized understanding Patient stated that she has been out and been to church so not sure if she may have been exposed to covid. Patient was given information on several covid testing sites. Patient stated that she is going to get tested. Patient was advised to quarantine until she gets her test results back. Patient was given ER precautions and she verbalized understanding.

## 2020-02-15 NOTE — Telephone Encounter (Signed)
Agreed.  Thanks.  

## 2020-02-16 ENCOUNTER — Other Ambulatory Visit: Payer: Self-pay

## 2020-02-16 ENCOUNTER — Inpatient Hospital Stay
Admission: EM | Admit: 2020-02-16 | Discharge: 2020-02-18 | DRG: 065 | Disposition: A | Discharge status: Home Health | Payer: Medicare HMO | Attending: Internal Medicine | Admitting: Internal Medicine

## 2020-02-16 ENCOUNTER — Emergency Department: Payer: Medicare HMO

## 2020-02-16 DIAGNOSIS — Z96652 Presence of left artificial knee joint: Secondary | ICD-10-CM | POA: Diagnosis present

## 2020-02-16 DIAGNOSIS — W19XXXA Unspecified fall, initial encounter: Secondary | ICD-10-CM | POA: Diagnosis present

## 2020-02-16 DIAGNOSIS — I6523 Occlusion and stenosis of bilateral carotid arteries: Secondary | ICD-10-CM | POA: Diagnosis not present

## 2020-02-16 DIAGNOSIS — Z9049 Acquired absence of other specified parts of digestive tract: Secondary | ICD-10-CM

## 2020-02-16 DIAGNOSIS — I10 Essential (primary) hypertension: Secondary | ICD-10-CM | POA: Diagnosis not present

## 2020-02-16 DIAGNOSIS — Z885 Allergy status to narcotic agent status: Secondary | ICD-10-CM | POA: Diagnosis not present

## 2020-02-16 DIAGNOSIS — E785 Hyperlipidemia, unspecified: Secondary | ICD-10-CM | POA: Diagnosis not present

## 2020-02-16 DIAGNOSIS — Z96641 Presence of right artificial hip joint: Secondary | ICD-10-CM | POA: Diagnosis present

## 2020-02-16 DIAGNOSIS — I6622 Occlusion and stenosis of left posterior cerebral artery: Secondary | ICD-10-CM | POA: Diagnosis not present

## 2020-02-16 DIAGNOSIS — Z20822 Contact with and (suspected) exposure to covid-19: Secondary | ICD-10-CM | POA: Diagnosis not present

## 2020-02-16 DIAGNOSIS — M25561 Pain in right knee: Secondary | ICD-10-CM | POA: Diagnosis present

## 2020-02-16 DIAGNOSIS — E669 Obesity, unspecified: Secondary | ICD-10-CM | POA: Diagnosis present

## 2020-02-16 DIAGNOSIS — I351 Nonrheumatic aortic (valve) insufficiency: Secondary | ICD-10-CM | POA: Diagnosis present

## 2020-02-16 DIAGNOSIS — Z6833 Body mass index (BMI) 33.0-33.9, adult: Secondary | ICD-10-CM

## 2020-02-16 DIAGNOSIS — M1711 Unilateral primary osteoarthritis, right knee: Secondary | ICD-10-CM | POA: Diagnosis not present

## 2020-02-16 DIAGNOSIS — G459 Transient cerebral ischemic attack, unspecified: Secondary | ICD-10-CM | POA: Diagnosis not present

## 2020-02-16 DIAGNOSIS — G9389 Other specified disorders of brain: Secondary | ICD-10-CM | POA: Diagnosis not present

## 2020-02-16 DIAGNOSIS — Z7982 Long term (current) use of aspirin: Secondary | ICD-10-CM | POA: Diagnosis not present

## 2020-02-16 DIAGNOSIS — K589 Irritable bowel syndrome without diarrhea: Secondary | ICD-10-CM | POA: Diagnosis present

## 2020-02-16 DIAGNOSIS — K760 Fatty (change of) liver, not elsewhere classified: Secondary | ICD-10-CM | POA: Diagnosis not present

## 2020-02-16 DIAGNOSIS — R0902 Hypoxemia: Secondary | ICD-10-CM | POA: Diagnosis not present

## 2020-02-16 DIAGNOSIS — M25461 Effusion, right knee: Secondary | ICD-10-CM | POA: Diagnosis not present

## 2020-02-16 DIAGNOSIS — Z79899 Other long term (current) drug therapy: Secondary | ICD-10-CM

## 2020-02-16 DIAGNOSIS — I69354 Hemiplegia and hemiparesis following cerebral infarction affecting left non-dominant side: Secondary | ICD-10-CM | POA: Diagnosis not present

## 2020-02-16 DIAGNOSIS — G4733 Obstructive sleep apnea (adult) (pediatric): Secondary | ICD-10-CM | POA: Diagnosis not present

## 2020-02-16 DIAGNOSIS — F329 Major depressive disorder, single episode, unspecified: Secondary | ICD-10-CM | POA: Diagnosis present

## 2020-02-16 DIAGNOSIS — I6611 Occlusion and stenosis of right anterior cerebral artery: Secondary | ICD-10-CM | POA: Diagnosis not present

## 2020-02-16 DIAGNOSIS — I639 Cerebral infarction, unspecified: Secondary | ICD-10-CM | POA: Diagnosis not present

## 2020-02-16 DIAGNOSIS — G8191 Hemiplegia, unspecified affecting right dominant side: Secondary | ICD-10-CM | POA: Diagnosis not present

## 2020-02-16 DIAGNOSIS — E782 Mixed hyperlipidemia: Secondary | ICD-10-CM | POA: Diagnosis present

## 2020-02-16 DIAGNOSIS — M7989 Other specified soft tissue disorders: Secondary | ICD-10-CM | POA: Diagnosis not present

## 2020-02-16 DIAGNOSIS — J309 Allergic rhinitis, unspecified: Secondary | ICD-10-CM | POA: Diagnosis present

## 2020-02-16 DIAGNOSIS — K219 Gastro-esophageal reflux disease without esophagitis: Secondary | ICD-10-CM | POA: Diagnosis not present

## 2020-02-16 DIAGNOSIS — F32A Depression, unspecified: Secondary | ICD-10-CM | POA: Diagnosis present

## 2020-02-16 DIAGNOSIS — R531 Weakness: Secondary | ICD-10-CM | POA: Diagnosis not present

## 2020-02-16 LAB — URINALYSIS, COMPLETE (UACMP) WITH MICROSCOPIC
Bilirubin Urine: NEGATIVE
Glucose, UA: NEGATIVE mg/dL
Hgb urine dipstick: NEGATIVE
Ketones, ur: NEGATIVE mg/dL
Leukocytes,Ua: NEGATIVE
Nitrite: NEGATIVE
Protein, ur: NEGATIVE mg/dL
Specific Gravity, Urine: 1.006 (ref 1.005–1.030)
pH: 6 (ref 5.0–8.0)

## 2020-02-16 LAB — COMPREHENSIVE METABOLIC PANEL
ALT: 18 U/L (ref 0–44)
AST: 27 U/L (ref 15–41)
Albumin: 4.2 g/dL (ref 3.5–5.0)
Alkaline Phosphatase: 107 U/L (ref 38–126)
Anion gap: 12 (ref 5–15)
BUN: 15 mg/dL (ref 8–23)
CO2: 24 mmol/L (ref 22–32)
Calcium: 9.6 mg/dL (ref 8.9–10.3)
Chloride: 107 mmol/L (ref 98–111)
Creatinine, Ser: 0.83 mg/dL (ref 0.44–1.00)
GFR calc Af Amer: >60 mL/min (ref >60)
GFR calc non Af Amer: >60 mL/min (ref >60)
Glucose, Bld: 137 mg/dL — ABNORMAL HIGH (ref 70–99)
Potassium: 3.7 mmol/L (ref 3.5–5.1)
Sodium: 143 mmol/L (ref 135–145)
Total Bilirubin: 0.8 mg/dL (ref 0.3–1.2)
Total Protein: 7.1 g/dL (ref 6.5–8.1)

## 2020-02-16 LAB — RESPIRATORY PANEL BY RT PCR (FLU A&B, COVID)
Influenza A by PCR: NEGATIVE
Influenza B by PCR: NEGATIVE
SARS Coronavirus 2 by RT PCR: NEGATIVE

## 2020-02-16 LAB — CBC
HCT: 44.2 % (ref 36.0–46.0)
Hemoglobin: 15.3 g/dL — ABNORMAL HIGH (ref 12.0–15.0)
MCH: 31.4 pg (ref 26.0–34.0)
MCHC: 34.6 g/dL (ref 30.0–36.0)
MCV: 90.6 fL (ref 80.0–100.0)
Platelets: 245 10*3/uL (ref 150–400)
RBC: 4.88 MIL/uL (ref 3.87–5.11)
RDW: 12.7 % (ref 11.5–15.5)
WBC: 10.7 10*3/uL — ABNORMAL HIGH (ref 4.0–10.5)
nRBC: 0 % (ref 0.0–0.2)

## 2020-02-16 LAB — TROPONIN I (HIGH SENSITIVITY)
Troponin I (High Sensitivity): 7 ng/L (ref <18)
Troponin I (High Sensitivity): 7 ng/L (ref <18)

## 2020-02-16 MED ORDER — LORATADINE 10 MG PO TABS
10.0000 mg | ORAL_TABLET | Freq: Every day | ORAL | Status: DC
  Administered 2020-02-16 – 2020-02-18 (×3): 10 mg via ORAL
  Filled 2020-02-16 (×4): qty 1

## 2020-02-16 MED ORDER — ACETAMINOPHEN 325 MG PO TABS
650.0000 mg | ORAL_TABLET | Freq: Four times a day (QID) | ORAL | Status: DC | PRN
  Administered 2020-02-16 – 2020-02-17 (×3): 650 mg via ORAL
  Filled 2020-02-16 (×3): qty 2

## 2020-02-16 MED ORDER — BIOTIN 10000 MCG PO TABS: 10000.0000 ug | ORAL_TABLET | Freq: Every day | ORAL | Status: DC

## 2020-02-16 MED ORDER — SERTRALINE HCL 50 MG PO TABS
25.0000 mg | ORAL_TABLET | Freq: Every day | ORAL | Status: DC
  Administered 2020-02-16 – 2020-02-18 (×3): 25 mg via ORAL
  Filled 2020-02-16 (×3): qty 1

## 2020-02-16 MED ORDER — ATORVASTATIN CALCIUM 20 MG PO TABS
80.0000 mg | ORAL_TABLET | Freq: Every day | ORAL | Status: DC
  Administered 2020-02-16 – 2020-02-18 (×3): 80 mg via ORAL
  Filled 2020-02-16 (×3): qty 4

## 2020-02-16 MED ORDER — FLUTICASONE PROPIONATE 50 MCG/ACT NA SUSP
2.0000 | Freq: Every day | NASAL | Status: DC | PRN
  Filled 2020-02-16 (×2): qty 16

## 2020-02-16 MED ORDER — ONDANSETRON HCL 4 MG/2ML IJ SOLN: 4.0000 mg | Freq: Three times a day (TID) | INTRAMUSCULAR | Status: DC | PRN

## 2020-02-16 MED ORDER — SENNOSIDES-DOCUSATE SODIUM 8.6-50 MG PO TABS: 1.0000 | ORAL_TABLET | Freq: Every evening | ORAL | Status: DC | PRN

## 2020-02-16 MED ORDER — ASPIRIN 81 MG PO CHEW
324.0000 mg | CHEWABLE_TABLET | Freq: Once | ORAL | Status: AC
  Administered 2020-02-16: 324 mg via ORAL
  Filled 2020-02-16: qty 4

## 2020-02-16 MED ORDER — CLOPIDOGREL BISULFATE 75 MG PO TABS
75.0000 mg | ORAL_TABLET | Freq: Every day | ORAL | Status: DC
  Administered 2020-02-16 – 2020-02-18 (×3): 75 mg via ORAL
  Filled 2020-02-16 (×4): qty 1

## 2020-02-16 MED ORDER — STROKE: EARLY STAGES OF RECOVERY BOOK: Freq: Once | Status: DC

## 2020-02-16 MED ORDER — HYDROXYZINE HCL 10 MG PO TABS
5.0000 mg | ORAL_TABLET | Freq: Every evening | ORAL | Status: DC | PRN
  Filled 2020-02-16: qty 1

## 2020-02-16 MED ORDER — VITAMIN D 25 MCG (1000 UNIT) PO TABS
1000.0000 [IU] | ORAL_TABLET | Freq: Every day | ORAL | Status: DC
  Administered 2020-02-16 – 2020-02-18 (×3): 1000 [IU] via ORAL
  Filled 2020-02-16 (×3): qty 1

## 2020-02-16 MED ORDER — HYDRALAZINE HCL 20 MG/ML IJ SOLN: 5.0000 mg | INTRAMUSCULAR | Status: DC | PRN

## 2020-02-16 MED ORDER — ENOXAPARIN SODIUM 40 MG/0.4ML ~~LOC~~ SOLN
40.0000 mg | SUBCUTANEOUS | Status: DC
  Administered 2020-02-16 – 2020-02-17 (×2): 40 mg via SUBCUTANEOUS
  Filled 2020-02-16 (×2): qty 0.4

## 2020-02-16 MED ORDER — ASPIRIN EC 81 MG PO TBEC
81.0000 mg | DELAYED_RELEASE_TABLET | Freq: Every day | ORAL | Status: DC
  Administered 2020-02-17 – 2020-02-18 (×2): 81 mg via ORAL
  Filled 2020-02-16 (×2): qty 1

## 2020-02-16 NOTE — ED Provider Notes (Signed)
[facility] Emergency Department Provider Note    First MD Initiated Contact with Patient 02/16/20 1109     (approximate)  I have reviewed the triage vital signs and the nursing notes.   HISTORY  Chief Complaint Cerebrovascular Accident    HPI [name] [name] is a 79 y.o. female the below listed past medical history presents to the ER for evaluation of an episode of right-sided weakness that resulted in fall last night.  States that she started feeling symptoms around 130 when she woke up.  The symptoms lasted the better part of our but got better.  By time she called EMS and arrived to the ER she was outside of the window when she woke up with the symptoms.  States it the weakness has subsided.  She denies any other pain other than some swelling and discomfort in her right ankle.  She is currently taking baby aspirin.    Past Medical History:  Diagnosis Date  . Arthritis    knees  . Depression   . Fatty liver   . GERD (gastroesophageal reflux disease)   . Hyperlipidemia   . Hypertension   . IBS (irritable bowel syndrome)   . Obesity   . Pneumonia   . Sleep apnea    uses CPAP   Family History  Problem Relation Age of Onset  . Heart attack Mother   . Dementia Father   . Aortic aneurysm Brother   . Colon cancer Neg Hx   . Breast cancer Neg Hx    Past Surgical History:  Procedure Laterality Date  . BLADDER SURGERY    . BREAST SURGERY     breast biopsy  . BROW LIFT Bilateral 04/14/2017   Procedure: BLEPHAROPLASTY UPPER EYELID WITH EXCESS SKIN;  Surgeon: Karle Starch, MD;  Location: [facility];  Service: Ophthalmology;  Laterality: Bilateral;  . CATARACT EXTRACTION W/PHACO Left 02/05/2016   Procedure: CATARACT EXTRACTION PHACO AND INTRAOCULAR LENS PLACEMENT (IOC);  Surgeon: Birder Robson, MD;  Location: [facility];  Service: Ophthalmology;  Laterality: Left;  [place] 01:10 AP% 22.3 CDE 15.67 Fluid pack lot # [id number]  . CATARACT  EXTRACTION W/PHACO Right 02/26/2016   Procedure: CATARACT EXTRACTION PHACO AND INTRAOCULAR LENS PLACEMENT (IOC);  Surgeon: Birder Robson, MD;  Location: [facility];  Service: Ophthalmology;  Laterality: Right;  [place] 57.4 AP% 24.0 CDE 13.75 Fluid Pack lot # [id number]  . CHOLECYSTECTOMY    . COLONOSCOPY WITH PROPOFOL N/A 12/18/2014   Procedure: COLONOSCOPY WITH PROPOFOL;  Surgeon: Manya Silvas, MD;  Location: [facility];  Service: Endoscopy;  Laterality: N/A;  . DEEP NECK LYMPH NODE BIOPSY / EXCISION    . JOINT REPLACEMENT    . KNEE ARTHROPLASTY Left 06/20/2015   Procedure: COMPUTER ASSISTED TOTAL KNEE ARTHROPLASTY;  Surgeon: Dereck Leep, MD;  Location: [facility];  Service: Orthopedics;  Laterality: Left;  . PTOSIS REPAIR Bilateral 04/14/2017   Procedure: PTOSIS REPAIR RESECT EX;  Surgeon: Karle Starch, MD;  Location: [facility];  Service: Ophthalmology;  Laterality: Bilateral;  sleep apnea  . TONSILLECTOMY    . TOTAL HIP ARTHROPLASTY Right 07/21/2018   Procedure: TOTAL HIP ARTHROPLASTY ANTERIOR APPROACH;  Surgeon: Gaynelle Arabian, MD;  Location: [facility];  Service: Orthopedics;  Laterality: Right;   Patient Active Problem List   Diagnosis Date Noted  . HLD (hyperlipidemia) 02/16/2020  . Stroke ([place]) 02/16/2020  . Fall 02/16/2020  . Abdominal wall hernia 10/30/2019  . Change in voice 10/30/2019  .  History of stroke 09/14/2019  . Spinal stenosis 07/24/2019  . Mixed hyperlipidemia   . OSA (obstructive sleep apnea)   . Dysfunction of eustachian tube 05/15/2019  . Other social stressor 03/29/2019  . 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 Diarrhea 06/20/2018  . Joint pain 06/20/2018  . 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 Medicare annual wellness visit, subsequent 09/15/2017  . SUI (stress urinary incontinence, female) 08/07/2017  . Back pain 06/16/2016  . Advance care planning 01/07/2016  . Sleep apnea   . 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 Allergic rhinitis 11/24/2014  . Adult BMI 30+ 11/24/2014  . Depression 11/24/2014  . Insomnia 08/23/2009  . Headache, migraine 05/10/2009  . HERPES SIMPLEX INFECTION 08/14/2006  . HYPERCHOLESTEROLEMIA 08/14/2006  . Anxiety state 08/14/2006  . Essential hypertension 08/14/2006  . HIATAL HERNIA 08/14/2006  . IRRITABLE BOWEL SYNDROME 08/14/2006  . 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 Primary osteoarthritis of right knee 08/14/2006  . PLANTAR FASCIITIS 08/14/2006      Prior to Admission medications   Medication Sig Start Date End Date Taking? Authorizing Provider  aspirin EC 81 MG tablet Take 81 mg by mouth daily.   Yes [provider]  atenolol (TENORMIN) 50 MG tablet TAKE ONE TABLET TWICE DAILY Patient taking differently: Take 50 mg by mouth 2 (two) times daily.  12/26/19  Yes Tonia Ghent, MD  atorvastatin (LIPITOR) 80 MG tablet TAKE ONE TABLET BY MOUTH EVERY DAY AT 6PM 01/09/20  Yes Tonia Ghent, MD  Biotin 10000 MCG TABS Take 10,000 mcg by mouth daily.   Yes [provider]  cetirizine (ZYRTEC) 10 MG tablet Take 10 mg by mouth daily as needed for allergies.   Yes [provider]  cholecalciferol (VITAMIN D) 1000 UNITS tablet Take 1,000 Units by mouth daily.    Yes [provider]  sertraline (ZOLOFT) 25 MG tablet TAKE ONE TABLET BY MOUTH EVERY DAY 01/09/20  Yes Tonia Ghent, MD  acetaminophen (TYLENOL) 500 MG tablet Take 500 mg by mouth every 6 (six) hours as needed for moderate pain.     [provider]  fluticasone (FLONASE) 50 MCG/ACT nasal spray Place 2 sprays into both nostrils daily as needed for allergies or rhinitis.    [provider]  hydrOXYzine (ATARAX/VISTARIL) 10 MG tablet Take 0.5-1 tablets (5-10 mg total) by mouth at bedtime as needed (for insomnia). 09/02/19   Tonia Ghent, MD  hyoscyamine (LEVSIN, ANASPAZ) 0.125 MG tablet Take 1 tablet (0.125 mg total) by mouth 3 (three)  times daily as needed. Patient not taking: Reported on 02/16/2020 06/17/18   Tonia Ghent, MD    Allergies Hydrocodone    Social History Social History   Tobacco Use  . Smoking status: Never Smoker  . Smokeless tobacco: Never Used  Vaping Use  . Vaping Use: Never used  Substance Use Topics  . Alcohol use: Yes    Comment: ocassional glass of wine 2-3 times a year  . Drug use: No    Review of Systems Patient denies headaches, rhinorrhea, blurry vision, numbness, shortness of breath, chest pain, edema, cough, abdominal pain, nausea, vomiting, diarrhea, dysuria, fevers, rashes or hallucinations unless otherwise stated above in HPI. ____________________________________________   PHYSICAL EXAM:  VITAL SIGNS: Vitals:   02/16/20 1200 02/16/20 1230  BP: 131/72 119/69  Pulse: 75 72  Resp: 19 15  Temp:    SpO2: 97% 95%    Constitutional: Alert and  oriented.  Eyes: Conjunctivae are normal.  Head: Atraumatic. Nose: No congestion/rhinnorhea. Mouth/Throat: Mucous membranes are moist.   Neck: No stridor. Painless ROM.  Cardiovascular: Normal rate, regular rhythm. Grossly normal heart sounds.  Good peripheral circulation. Respiratory: Normal respiratory effort.  No retractions. Lungs CTAB. Gastrointestinal: Soft and nontender. No distention. No abdominal bruits. No CVA tenderness. Genitourinary:  Musculoskeletal: No lower extremity tenderness nor edema.  No joint effusions. Neurologic:  CN- intact.  No facial droop, Normal FNF.  Normal heel to shin.  Sensation intact bilaterally. Normal speech and language. No gross focal neurologic deficits are appreciated. No gait instability. Skin:  Skin is warm, dry and intact. No rash noted. Psychiatric: Mood and affect are normal. Speech and behavior are normal.  ____________________________________________   LABS (all labs ordered are listed, but only abnormal results are displayed)  Results for orders placed or performed during  the hospital encounter of 02/16/20 (from the past 24 hour(s))  CBC     Status: Abnormal   Collection Time: 02/16/20  6:07 AM  Result Value Ref Range   WBC 10.7 (H) 4.0 - 10.5 K/uL   RBC 4.88 3.87 - 5.11 MIL/uL   Hemoglobin 15.3 (H) 12.0 - 15.0 g/dL   HCT 44.2 36 - 46 %   MCV 90.6 80.0 - 100.0 fL   MCH 31.4 26.0 - 34.0 pg   MCHC 34.6 30.0 - 36.0 g/dL   RDW 12.7 11.5 - 15.5 %   Platelets 245 150 - 400 K/uL   nRBC 0.0 0.0 - 0.2 %  Comprehensive metabolic panel     Status: Abnormal   Collection Time: 02/16/20  6:07 AM  Result Value Ref Range   Sodium 143 135 - 145 mmol/L   Potassium 3.7 3.5 - 5.1 mmol/L   Chloride 107 98 - 111 mmol/L   CO2 24 22 - 32 mmol/L   Glucose, Bld 137 (H) 70 - 99 mg/dL   BUN 15 8 - 23 mg/dL   Creatinine, Ser 0.83 0.44 - 1.00 mg/dL   Calcium 9.6 8.9 - 10.3 mg/dL   Total Protein 7.1 6.5 - 8.1 g/dL   Albumin 4.2 3.5 - 5.0 g/dL   AST 27 15 - 41 U/L   ALT 18 0 - 44 U/L   Alkaline Phosphatase 107 38 - 126 U/L   Total Bilirubin 0.8 0.3 - 1.2 mg/dL   GFR calc non Af Amer >60 >60 mL/min   GFR calc Af Amer >60 >60 mL/min   Anion gap 12 5 - 15  Troponin I (High Sensitivity)     Status: None   Collection Time: 02/16/20  6:07 AM  Result Value Ref Range   Troponin I (High Sensitivity) 7 <18 ng/L  Troponin I (High Sensitivity)     Status: None   Collection Time: 02/16/20 11:39 AM  Result Value Ref Range   Troponin I (High Sensitivity) 7 <18 ng/L  Urinalysis, Complete w Microscopic Urine, Unspecified Source     Status: Abnormal   Collection Time: 02/16/20 12:21 PM  Result Value Ref Range   Color, Urine STRAW (A) YELLOW   APPearance CLEAR (A) CLEAR   Specific Gravity, Urine 1.006 1.005 - 1.030   pH 6.0 5.0 - 8.0   Glucose, UA NEGATIVE NEGATIVE mg/dL   Hgb urine dipstick NEGATIVE NEGATIVE   Bilirubin Urine NEGATIVE NEGATIVE   Ketones, ur NEGATIVE NEGATIVE mg/dL   Protein, ur NEGATIVE NEGATIVE mg/dL   Nitrite NEGATIVE NEGATIVE   Leukocytes,Ua NEGATIVE NEGATIVE  RBC / HPF 0-5 0 - 5 RBC/hpf   WBC, UA 0-5 0 - 5 WBC/hpf   Bacteria, UA RARE (A) NONE SEEN   Squamous Epithelial / LPF 0-5 0 - 5   ____________________________________________  EKG My review and personal interpretation at Time: 5:53   Indication: weaknss  Rate: 80  Rhythm: sinus Axis: normal Other: normal intervals, no stemi ____________________________________________  RADIOLOGY  I personally reviewed all radiographic images ordered to evaluate for the above acute complaints and reviewed radiology reports and findings.  These findings were personally discussed with the patient.  Please see medical record for radiology report.  ____________________________________________   PROCEDURES  Procedure(s) performed:  Procedures    Critical Care performed: no ____________________________________________   INITIAL IMPRESSION / ASSESSMENT AND PLAN / ED COURSE  Pertinent labs & imaging results that were available during my care of the patient were reviewed by me and considered in my medical decision making (see chart for details).   DDX: cva, tia, hypoglycemia, dehydration, electrolyte abnormality, dissection, sepsis   [name] [name] is a 79 y.o. who presents to the ED with presentation as described above.  Patient symptoms are consistent with TIA she is outside of the window for code stroke her symptoms have resolved but given her risk factors imaging was ordered CT imaging appeared stable but given her risk factors MRI was ordered after consulting with Dr. Irish Elders of neurology have ordered MRI brain as well as MRA.  MRI brain does show evidence of acute infarct with multiple areas of atherosclerotic stenosis.  Will be admitted to the hospital for further work up and management.     The patient was evaluated in Emergency Department today for the symptoms described in the history of present illness. He/she was evaluated in the context of the global COVID-19 pandemic, which  necessitated consideration that the patient might be at risk for infection with the SARS-CoV-2 virus that causes COVID-19. Institutional protocols and algorithms that pertain to the evaluation of patients at risk for COVID-19 are in a state of rapid change based on information released by regulatory bodies including the CDC and federal and state organizations. These policies and algorithms were followed during the patient's care in the ED.  As part of my medical decision making, I reviewed the following data within the [place] notes reviewed and incorporated, Labs reviewed, notes from prior ED visits and [place] Controlled Substance Database   ____________________________________________   FINAL CLINICAL IMPRESSION(S) / ED DIAGNOSES  Final diagnoses:  Cerebrovascular accident (CVA), unspecified mechanism ([place])      NEW MEDICATIONS STARTED DURING THIS VISIT:  New Prescriptions   No medications on file     Note:  This document was prepared using Dragon voice recognition software and may include unintentional dictation errors.    Merlyn Lot, MD 02/16/20 [phone]

## 2020-02-16 NOTE — ED Triage Notes (Addendum)
Pt in with co right sided weakness states started at 0130 with symptoms. Pt has hx of stroke with left sided deficits. EMS cbg 148, stroke screen neg per EMS, 18g to left [facility] per EMS. States strength to hand has improved but states it is still not back to baseline. Pt did twist her right ankle trying to get up. Swelling noted to ankle. Pt is also able to lift and bend right leg which was able to do at onset of symptoms. No facial drooping noted or slurred speech.

## 2020-02-16 NOTE — Consult Note (Addendum)
Reason for Consult: R hand weakness  Requesting Physician: Dr. Quentin Cornwall   CC: R side weakness    HPI: [name] [name] is an 79 y.o. female with hx  Of HTN, depression, HLD presents with episode of right-sided weakness that resulted in fall last night.  States that she started feeling symptoms around 130 when she woke up.  The symptoms lasted the better part of our but got better.  currently back to baseline. On ASA 81 mg at home.    Past Medical History:  Diagnosis Date   Arthritis    knees   Depression    Fatty liver    GERD (gastroesophageal reflux disease)    Hyperlipidemia    Hypertension    IBS (irritable bowel syndrome)    Obesity    Pneumonia    Sleep apnea    uses CPAP    Past Surgical History:  Procedure Laterality Date   BLADDER SURGERY     BREAST SURGERY     breast biopsy   BROW LIFT Bilateral 04/14/2017   Procedure: BLEPHAROPLASTY UPPER EYELID WITH EXCESS SKIN;  Surgeon: Karle Starch, MD;  Location: [facility];  Service: Ophthalmology;  Laterality: Bilateral;   CATARACT EXTRACTION W/PHACO Left 02/05/2016   Procedure: CATARACT EXTRACTION PHACO AND INTRAOCULAR LENS PLACEMENT ([place]);  Surgeon: Birder Robson, MD;  Location: [facility];  Service: Ophthalmology;  Laterality: Left;  [place] 01:10 AP% 22.3 CDE 15.67 Fluid pack lot # [id number]   CATARACT EXTRACTION W/PHACO Right 02/26/2016   Procedure: CATARACT EXTRACTION PHACO AND INTRAOCULAR LENS PLACEMENT ([facility]);  Surgeon: Birder Robson, MD;  Location: [facility];  Service: Ophthalmology;  Laterality: Right;  [place] 57.4 AP% 24.0 CDE 13.75 Fluid Pack lot # [id number]   CHOLECYSTECTOMY     COLONOSCOPY WITH PROPOFOL N/A 12/18/2014   Procedure: COLONOSCOPY WITH PROPOFOL;  Surgeon: Manya Silvas, MD;  Location: [facility];  Service: Endoscopy;  Laterality: N/A;   DEEP NECK LYMPH NODE BIOPSY / EXCISION     JOINT REPLACEMENT     KNEE ARTHROPLASTY Left 06/20/2015   Procedure: COMPUTER ASSISTED TOTAL  KNEE ARTHROPLASTY;  Surgeon: Dereck Leep, MD;  Location: [facility];  Service: Orthopedics;  Laterality: Left;   PTOSIS REPAIR Bilateral 04/14/2017   Procedure: PTOSIS REPAIR RESECT EX;  Surgeon: Karle Starch, MD;  Location: [facility];  Service: Ophthalmology;  Laterality: Bilateral;  sleep apnea   TONSILLECTOMY     TOTAL HIP ARTHROPLASTY Right 07/21/2018   Procedure: TOTAL HIP ARTHROPLASTY ANTERIOR APPROACH;  Surgeon: Gaynelle Arabian, MD;  Location: [facility];  Service: Orthopedics;  Laterality: Right;    Family History  Problem Relation Age of Onset   Heart attack Mother    Dementia Father    Aortic aneurysm Brother    Colon cancer Neg Hx    Breast cancer Neg Hx     Social History:  reports that she has never smoked. She has never used smokeless tobacco. She reports current alcohol use. She reports that she does not use drugs.  Allergies  Allergen Reactions   Hydrocodone Nausea Only    Noted after surgery, may be able to tolerate with food    Medications: I have reviewed the patient's current medications.    Physical Examination: Blood pressure 119/69, pulse 72, temperature 98.6 F (37 C), temperature source Oral, resp. rate 15, height 5' (1.524 m), weight 77.1 kg, SpO2 95 %.    Neurological Examination   Mental Status: Alert, oriented, thought content appropriate.  Speech  fluent without evidence of aphasia.  Able to follow 3 step commands without difficulty. 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 No results for input(s): AMMONIA in the last 168 hours.  CBC: Recent Labs  Lab 02/16/20 0607  WBC 10.7*  HGB 15.3*  HCT 44.2  MCV 90.6  PLT 245    Cardiac Enzymes: No results for input(s): CKTOTAL, CKMB, CKMBINDEX, TROPONINI in the last 168 hours.  BNP: Invalid input(s): POCBNP  CBG: No results for input(s): GLUCAP in the last 168 hours.  Microbiology: Results for orders placed or performed during the hospital encounter of 07/11/19  SARS CORONAVIRUS 2 (TAT 6-24 HRS) Nasopharyngeal Nasopharyngeal Swab     Status: None   Collection Time: 07/11/19  5:40 PM   Specimen: Nasopharyngeal Swab  Result Value Ref Range Status   SARS Coronavirus 2 NEGATIVE NEGATIVE Final    Comment: (NOTE) SARS-CoV-2 target nucleic acids are NOT DETECTED. The SARS-CoV-2 RNA is generally detectable in upper and lower respiratory specimens during the acute phase of infection. Negative results do not preclude SARS-CoV-2 infection, do not rule out co-infections with other pathogens, and should not be used as the sole basis for treatment or other patient management decisions. Negative results must be combined with clinical observations, patient history, and epidemiological information. The expected result is Negative. Fact Sheet for Patients: SugarRoll.be Fact Sheet for Healthcare  Providers: https://www.woods-mathews.com/ This test is not yet approved or cleared by the [place] FDA and  has been authorized for detection and/or diagnosis of SARS-CoV-2 by FDA under an Emergency Use Authorization (EUA). This EUA will remain  in effect (meaning this test can be used) for the duration of the COVID-19 declaration under Section 56 4(b)(1) of the Act, 21 U.S.C. section 360bbb-3(b)(1), unless the authorization is terminated or revoked sooner. Performed at [facility], [place] [address], [place], [place] [address]     Coagulation Studies: No results for input(s): LABPROT, INR in the last 72 hours.  Urinalysis:  Recent Labs  Lab 02/16/20 1221  COLORURINE STRAW*  LABSPEC 1.006  PHURINE 6.0  GLUCOSEU NEGATIVE  HGBUR NEGATIVE  BILIRUBINUR NEGATIVE  KETONESUR NEGATIVE  PROTEINUR NEGATIVE  NITRITE NEGATIVE  LEUKOCYTESUR NEGATIVE    Lipid Panel:     Component Value Date/Time   CHOL 129 10/13/2019 0812   CHOL 189 05/31/2015 1136   TRIG 114.0 10/13/2019 0812   HDL 42.00 10/13/2019 0812   HDL 38 (L) 05/31/2015 1136   CHOLHDL 3 10/13/2019 3149  VLDL 22.8 10/13/2019 0812   LDLCALC 64 10/13/2019 0812   LDLCALC 108 (H) 05/31/2015 1136    HgbA1C:  Lab Results  Component Value Date   HGBA1C 5.5 07/11/2019    Urine Drug Screen:      Component Value Date/Time   LABOPIA NONE DETECTED 07/11/2019 1100   COCAINSCRNUR NONE DETECTED 07/11/2019 1100   LABBENZ NONE DETECTED 07/11/2019 1100   AMPHETMU NONE DETECTED 07/11/2019 1100   THCU NONE DETECTED 07/11/2019 1100   LABBARB NONE DETECTED 07/11/2019 1100    Alcohol Level: No results for input(s): ETH in the last 168 hours.  Other results: EKG: normal EKG, normal sinus rhythm, unchanged from previous tracings.  Imaging: DG Ankle Complete Right  Result Date: 02/16/2020 CLINICAL DATA:  Right ankle injury EXAM: RIGHT ANKLE - COMPLETE 3+ VIEW COMPARISON:  None. FINDINGS: Three view  radiograph right ankle demonstrates normal alignment. No fracture or dislocation. The ankle mortise is intact. There is extensive soft tissue swelling seen superficial to the lateral malleolus. IMPRESSION: Soft tissue swelling.  No fracture or dislocation. Electronically Signed   By: Fidela Salisbury MD   On: 02/16/2020 06:28   CT Head Wo Contrast  Result Date: 02/16/2020 CLINICAL DATA:  Right-sided weakness EXAM: CT HEAD WITHOUT CONTRAST TECHNIQUE: Contiguous axial images were obtained from the base of the skull through the vertex without intravenous contrast. COMPARISON:  07/11/2019 FINDINGS: Brain: Normal anatomic configuration. Parenchymal volume loss is commensurate with the patient's age. Mild subcortical and periventricular white matter changes are present likely reflecting the sequela of small vessel ischemia. Since the prior examination, there has developed a remote appearing infarct within the right corona radiata. No abnormal intra or extra-axial mass lesion or fluid collection. No abnormal mass effect or midline shift. No evidence of acute intracranial hemorrhage or infarct. Ventricular size is normal. Cerebellum unremarkable. Vascular: No asymmetric hyperdense vasculature at the skull base. Skull: Intact Sinuses/Orbits: Paranasal sinuses are clear. Orbits are unremarkable. Other: Mastoid air cells and middle ear cavities are clear. IMPRESSION: Mild senescent changes. Interval development of a remote appearing infarct within the right corona radiata. No evidence of acute intracranial hemorrhage or infarct. Electronically Signed   By: Fidela Salisbury MD   On: 02/16/2020 07:12     Assessment/Plan:  79 y.o. female with hx  Of HTN, depression, HLD presents with episode of right-sided weakness that resulted in fall last night.  States that she started feeling symptoms around 130 when she woke up.  The symptoms lasted the better part of our but got better.  currently back to baseline. On ASA at home.     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  - will need prolonged dual anti platelet therapy for now with ASA and plavix(started). In long term might need Effient instead of plavix.  Plavix assay will need to be sent as out patient or at least 5 doses to see if she is responsive to plavix.   - admission - pt/ot   02/16/2020, 1:59 PM

## 2020-02-16 NOTE — Progress Notes (Signed)
SLP Cancellation Note  Patient Details Name: [name] [name] MRN: [id number] DOB: 03-12-1941   Cancelled treatment:       Reason Eval/Treat Not Completed: SLP screened, no needs identified, will sign off (chart reviewed; met w/ pt, consulted NSG).  Pt denied any difficulty swallowing and is currently on a regular diet; tolerates swallowing pills w/ water per NSG. Pt conversed at conversational level w/out gross deficits noted -- 100% intelligible. Pt denied any new speech-language deficits. 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 She stated "they discharged me at the end of the Summer" after doing "well w/ all of my therapy". 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    No further skilled ST services indicated as pt appears at her Baseline -- she denied any new issues. Encouraged pt to f/u w/ her PCP for a referral to [facility] services if any new needs. Pt agreed. NSG to reconsult if any change in status.  NSG updated as to pt's request to see a Dietician for Healthy meal education.      Orinda Kenner, MS, CCC-SLP Speech Language Pathologist Rehab Services [phone] [facility] 02/16/2020, 4:43 PM

## 2020-02-16 NOTE — H&P (Signed)
History and Physical    [name] [name] QPY:[id number] DOB: 09/12/1940 DOA: 02/16/2020  Referring MD/NP/PA:   PCP: Tonia Ghent, MD   Patient coming from:  The patient is coming from home.  At baseline, pt is independent for most of ADL.        Chief Complaint: right sided weakness  HPI: [name] [name] is a 79 y.o. female with medical history significant of hypertension, hyperlipidemia, stroke, GERD, depression, anxiety, OSA on CPAP, IBS, who presents with right-sided weakness.  Patient states that she fell last night at about 1:30 accidentally. She then developed left-sided weakness in the arm and leg. Her left-sided weakness has almost resolved at arrival to the ED.  No facial droop or slurred speech. Pt did twist her right ankle when trying to get up. She has pain and swelling in right ankle. Patient denies any chest pain, shortness breath, cough, fever or chills.  No nausea vomiting, diarrhea, abdominal pain, symptoms of UTI.  ED Course: pt was found to have WBC 10.7, negative urinalysis, pending COVID-19 PCR, electrolytes renal function okay, temperature normal, blood pressure 119/69, heart rate 72, RR 15, oxygen saturation 95% on room air.  CT of head is negative for acute intracranial abnormalities.  X-ray of right ankle is negative for bony fracture.  Patient is placed on MedSurg bed for observation.  Dr. Irish Elders of neurology is consulted.  MRI brain:  1. 1.5 cm acute cortically based infarct within the anterior  paramedian left frontal lobe.  2. 3 additional punctate acute infarcts within the left frontal lobe  as described.  3. Known chronic small-vessel infarct within the right corona  radiata/basal ganglia.  4. Stable background mild generalized parenchymal atrophy and  moderate chronic small vessel ischemic disease.   MRA head:  1. Examination limited by motion degradation.  2. Interval progression of a stenosis within the distal A2/proximal  A3 left anterior cerebral  artery which is now severe.  3. Additional intracranial atherosclerotic stenoses without interval  change, most notably as follows.  4. 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  5. Moderate focal stenosis within the cavernous right ICA.  6. Moderate stenosis within the A2 right ACA.  7. Moderate stenosis within the proximal P2 left PCA.    Review of Systems:   General: no fevers, chills, no body weight gain, has fatigue HEENT: no blurry vision, hearing changes or sore throat Respiratory: no dyspnea, coughing, wheezing CV: no chest pain, no palpitations GI: no nausea, vomiting, abdominal pain, diarrhea, constipation GU: no dysuria, burning on urination, increased urinary frequency, hematuria  Ext: no leg edema Neuro: no vision change or hearing loss.  Has fall and right-sided weakness Skin: no rash, no skin tear. MSK: Has pain and swelling in right ankle  heme: No easy bruising.  Travel history: No recent long distant travel.  Allergy:  Allergies  Allergen Reactions  . Hydrocodone Nausea Only    Noted after surgery, may be able to tolerate with food    Past Medical History:  Diagnosis Date  . Arthritis    knees  . Depression   . Fatty liver   . GERD (gastroesophageal reflux disease)   . Hyperlipidemia   . Hypertension   . IBS (irritable bowel syndrome)   . Obesity   . Pneumonia   . Sleep apnea    uses CPAP    Past Surgical History:  Procedure Laterality Date  . BLADDER SURGERY    . BREAST SURGERY  breast biopsy  . BROW LIFT Bilateral 04/14/2017   Procedure: BLEPHAROPLASTY UPPER EYELID WITH EXCESS SKIN;  Surgeon: Karle Starch, MD;  Location: [place];  Service: Ophthalmology;  Laterality: Bilateral;  . CATARACT EXTRACTION W/PHACO Left 02/05/2016   Procedure: CATARACT EXTRACTION PHACO AND INTRAOCULAR LENS PLACEMENT (IOC);  Surgeon: Birder Robson, MD;  Location: [facility];  Service: Ophthalmology;  Laterality: Left;  [place] 01:10 AP%  22.3 CDE 15.67 Fluid pack lot # [id number]  . CATARACT EXTRACTION W/PHACO Right 02/26/2016   Procedure: CATARACT EXTRACTION PHACO AND INTRAOCULAR LENS PLACEMENT (IOC);  Surgeon: Birder Robson, MD;  Location: [facility];  Service: Ophthalmology;  Laterality: Right;  [place] 57.4 AP% 24.0 CDE 13.75 Fluid Pack lot # [id number]  . CHOLECYSTECTOMY    . COLONOSCOPY WITH PROPOFOL N/A 12/18/2014   Procedure: COLONOSCOPY WITH PROPOFOL;  Surgeon: Manya Silvas, MD;  Location: [facility];  Service: Endoscopy;  Laterality: N/A;  . DEEP NECK LYMPH NODE BIOPSY / EXCISION    . JOINT REPLACEMENT    . KNEE ARTHROPLASTY Left 06/20/2015   Procedure: COMPUTER ASSISTED TOTAL KNEE ARTHROPLASTY;  Surgeon: Dereck Leep, MD;  Location: [facility];  Service: Orthopedics;  Laterality: Left;  . PTOSIS REPAIR Bilateral 04/14/2017   Procedure: PTOSIS REPAIR RESECT EX;  Surgeon: Karle Starch, MD;  Location: [place];  Service: Ophthalmology;  Laterality: Bilateral;  sleep apnea  . TONSILLECTOMY    . TOTAL HIP ARTHROPLASTY Right 07/21/2018   Procedure: TOTAL HIP ARTHROPLASTY ANTERIOR APPROACH;  Surgeon: Gaynelle Arabian, MD;  Location: [facility];  Service: Orthopedics;  Laterality: Right;    Social History:  reports that she has never smoked. She has never used smokeless tobacco. She reports current alcohol use. She reports that she does not use drugs.  Family History:  Family History  Problem Relation Age of Onset  . Heart attack Mother   . Dementia Father   . Aortic aneurysm Brother   . Colon cancer Neg Hx   . Breast cancer Neg Hx      Prior to Admission medications   Medication Sig Start Date End Date Taking? Authorizing Provider  aspirin EC 81 MG tablet Take 81 mg by mouth daily.   Yes [provider]  atenolol (TENORMIN) 50 MG tablet TAKE ONE TABLET TWICE DAILY Patient taking differently: Take 50 mg by mouth 2 (two) times daily.  12/26/19  Yes Tonia Ghent, MD  atorvastatin (LIPITOR) 80 MG  tablet TAKE ONE TABLET BY MOUTH EVERY DAY AT 6PM 01/09/20  Yes Tonia Ghent, MD  Biotin 10000 MCG TABS Take 10,000 mcg by mouth daily.   Yes [provider]  cetirizine (ZYRTEC) 10 MG tablet Take 10 mg by mouth daily as needed for allergies.   Yes [provider]  cholecalciferol (VITAMIN D) 1000 UNITS tablet Take 1,000 Units by mouth daily.    Yes [provider]  sertraline (ZOLOFT) 25 MG tablet TAKE ONE TABLET BY MOUTH EVERY DAY 01/09/20  Yes Tonia Ghent, MD  acetaminophen (TYLENOL) 500 MG tablet Take 500 mg by mouth every 6 (six) hours as needed for moderate pain.     [provider]  fluticasone (FLONASE) 50 MCG/ACT nasal spray Place 2 sprays into both nostrils daily as needed for allergies or rhinitis.    [provider]  hydrOXYzine (ATARAX/VISTARIL) 10 MG tablet Take 0.5-1 tablets (5-10 mg total) by mouth at bedtime as needed (for insomnia). 09/02/19   Tonia Ghent, MD  hyoscyamine (  LEVSIN, ANASPAZ) 0.125 MG tablet Take 1 tablet (0.125 mg total) by mouth 3 (three) times daily as needed. Patient not taking: Reported on 02/16/2020 06/17/18   Tonia Ghent, MD    Physical Exam: Vitals:   02/16/20 0559 02/16/20 1141 02/16/20 1200 02/16/20 1230  BP:  (!) 158/80 131/72 119/69  Pulse:  76 75 72  Resp:  17 19 15   Temp:      TempSrc:      SpO2:  100% 97% 95%  Weight: 77.1 kg     Height: 5' (1.524 m)      General: Not in acute distress HEENT:       Eyes: PERRL, EOMI, no scleral icterus.       ENT: No discharge from the ears and nose, no pharynx injection, no tonsillar enlargement.        Neck: No JVD, no bruit, no mass felt. Heme: No neck lymph node enlargement. Cardiac: S1/S2, RRR, No murmurs, No gallops or rubs. Respiratory:  No rales, wheezing, rhonchi or rubs. GI: Soft, nondistended, nontender, no rebound pain, no organomegaly, BS present. GU: No hematuria Ext: No pitting leg edema bilaterally. 2+DP/PT pulse  bilaterally. Musculoskeletal: has swelling and tenderness in right ankle. Skin: No rashes.  Neuro: Alert, oriented X3, cranial nerves II-XII grossly intact, moves all extremities normally. Muscle strength 5/5 in all extremities, sensation to light touch intact. Brachial reflex 2+ bilaterally.  Psych: Patient is not psychotic, no suicidal or hemocidal ideation.  Labs on Admission: I have personally reviewed following labs and imaging studies  CBC: Recent Labs  Lab 02/16/20 0607  WBC 10.7*  HGB 15.3*  HCT 44.2  MCV 90.6  PLT 562   Basic Metabolic Panel: Recent Labs  Lab 02/16/20 0607  NA 143  K 3.7  CL 107  CO2 24  GLUCOSE 137*  BUN 15  CREATININE 0.83  CALCIUM 9.6   GFR: Estimated Creatinine Clearance: 52.1 mL/min (by C-G formula based on SCr of 0.83 mg/dL). Liver Function Tests: Recent Labs  Lab 02/16/20 0607  AST 27  ALT 18  ALKPHOS 107  BILITOT 0.8  PROT 7.1  ALBUMIN 4.2   No results for input(s): LIPASE, AMYLASE in the last 168 hours. No results for input(s): AMMONIA in the last 168 hours. Coagulation Profile: No results for input(s): INR, PROTIME in the last 168 hours. Cardiac Enzymes: No results for input(s): CKTOTAL, CKMB, CKMBINDEX, TROPONINI in the last 168 hours. BNP (last 3 results) No results for input(s): PROBNP in the last 8760 hours. HbA1C: No results for input(s): HGBA1C in the last 72 hours. CBG: No results for input(s): GLUCAP in the last 168 hours. Lipid Profile: No results for input(s): CHOL, HDL, LDLCALC, TRIG, CHOLHDL, LDLDIRECT in the last 72 hours. Thyroid Function Tests: No results for input(s): TSH, T4TOTAL, FREET4, T3FREE, THYROIDAB in the last 72 hours. Anemia Panel: No results for input(s): VITAMINB12, FOLATE, FERRITIN, TIBC, IRON, RETICCTPCT in the last 72 hours. Urine analysis:    Component Value Date/Time   COLORURINE STRAW (A) 02/16/2020 1221   APPEARANCEUR CLEAR (A) 02/16/2020 1221   APPEARANCEUR Clear 11/26/2012 1630    LABSPEC 1.006 02/16/2020 1221   LABSPEC 1.009 11/26/2012 1630   PHURINE 6.0 02/16/2020 1221   GLUCOSEU NEGATIVE 02/16/2020 1221   GLUCOSEU Negative 11/26/2012 1630   HGBUR NEGATIVE 02/16/2020 1221   BILIRUBINUR NEGATIVE 02/16/2020 1221   BILIRUBINUR Neg 03/09/2017 1601   BILIRUBINUR Negative 11/26/2012 [place] 02/16/2020 [place] 02/16/2020  1221   UROBILINOGEN 0.2 03/09/2017 1601   NITRITE NEGATIVE 02/16/2020 1221   LEUKOCYTESUR NEGATIVE 02/16/2020 1221   LEUKOCYTESUR Negative 11/26/2012 1630   Sepsis Labs: @LABRCNTIP (procalcitonin:4,lacticidven:4) )No results found for this or any previous visit (from the past 240 hour(s)).   Radiological Exams on Admission: DG Ankle Complete Right  Result Date: 02/16/2020 CLINICAL DATA:  Right ankle injury EXAM: RIGHT ANKLE - COMPLETE 3+ VIEW COMPARISON:  None. FINDINGS: Three view radiograph right ankle demonstrates normal alignment. No fracture or dislocation. The ankle mortise is intact. There is extensive soft tissue swelling seen superficial to the lateral malleolus. IMPRESSION: Soft tissue swelling.  No fracture or dislocation. Electronically Signed   By: Fidela Salisbury MD   On: 02/16/2020 06:28   CT Head Wo Contrast  Result Date: 02/16/2020 CLINICAL DATA:  Right-sided weakness EXAM: CT HEAD WITHOUT CONTRAST TECHNIQUE: Contiguous axial images were obtained from the base of the skull through the vertex without intravenous contrast. COMPARISON:  07/11/2019 FINDINGS: Brain: Normal anatomic configuration. Parenchymal volume loss is commensurate with the patient's age. Mild subcortical and periventricular white matter changes are present likely reflecting the sequela of small vessel ischemia. Since the prior examination, there has developed a remote appearing infarct within the right corona radiata. No abnormal intra or extra-axial mass lesion or fluid collection. No abnormal mass effect or midline shift. No  evidence of acute intracranial hemorrhage or infarct. Ventricular size is normal. Cerebellum unremarkable. Vascular: No asymmetric hyperdense vasculature at the skull base. Skull: Intact Sinuses/Orbits: Paranasal sinuses are clear. Orbits are unremarkable. Other: Mastoid air cells and middle ear cavities are clear. IMPRESSION: Mild senescent changes. Interval development of a remote appearing infarct within the right corona radiata. No evidence of acute intracranial hemorrhage or infarct. Electronically Signed   By: Fidela Salisbury MD   On: 02/16/2020 07:12   MR ANGIO HEAD WO CONTRAST  Result Date: 02/16/2020 CLINICAL DATA:  Transient ischemic attack (TIA), right-sided weakness. EXAM: MRI HEAD WITHOUT CONTRAST MRA HEAD WITHOUT CONTRAST TECHNIQUE: Multiplanar, multiecho pulse sequences of the brain and surrounding structures were obtained without intravenous contrast. Angiographic images of the head were obtained using MRA technique without contrast. COMPARISON:  Noncontrast head CT 02/16/2020, brain MRI 12/11/2019. CT angiogram head/neck 07/12/2019. FINDINGS: MRI HEAD FINDINGS Brain: Stable, mild generalized parenchymal atrophy. There is a punctate acute cortical infarct within the posterior aspect of the left superior frontal gyrus (series 5, image 89). Immediately adjacent, there is an additional punctate acute infarct within the posterior left frontal lobe white matter. Additional punctate acute cortical infarct within the mid left frontal lobe within the left middle frontal gyrus (series 5, image 81). Acute cortically based infarct within the paramedian anterior left frontal lobe measuring 1.5 cm (for instance as seen on series 5, image 75). 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 Stable background moderate multifocal T2/FLAIR hyperintensity within the cerebral white matter which is nonspecific, but consistent with chronic small vessel ischemic disease. As before,  lesser chronic small vessel ischemic changes are present within the pons. Unchanged punctate foci of SWI signal loss within the left parietal lobe likely reflecting chronic microhemorrhages. No evidence of intracranial mass. No extra-axial fluid collection. No midline shift. Vascular: Reported below. Skull and upper cervical spine: No focal marrow lesion. Sinuses/Orbits: Visualized orbits show no acute finding. Mild ethmoid sinus mucosal thickening. No significant mastoid effusion. MRA HEAD FINDINGS The examination is mild to moderately motion degraded, limiting evaluation. The intracranial internal carotid arteries are patent. As before,  there is a severe focal stenosis within the cavernous segment on the left and moderate focal stenosis within the cavernous segment on the right. Motion degradation limits evaluation of the M1 and proximal M2 middle cerebral arteries. No M1 or proximal M2 occlusion or high-grade stenosis is identified. Motion degradation limits evaluation of the anterior cerebral arteries. Unchanged moderate stenosis within the A2 right anterior cerebral artery. Interval progression of a stenosis within the distal A2/proximal A3 left anterior cerebral artery, now severe (series 1, images 145-148). (Series 1029, image 1). The visualized intracranial vertebral arteries are patent without significant stenosis, as is the basilar artery. The posterior cerebral arteries are patent bilaterally. Unchanged moderate stenosis within the proximal P2 left posterior cerebral artery (series 1045, image 19). No intracranial aneurysm is identified. IMPRESSION: MRI brain: 1. 1.5 cm acute cortically based infarct within the anterior paramedian left frontal lobe. 2. 3 additional punctate acute infarcts within the left frontal lobe as described. 3. 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 4. Stable background mild generalized parenchymal atrophy and moderate chronic small vessel  ischemic disease. MRA head: 1. Examination limited by motion degradation. 2. Interval progression of a stenosis within the distal A2/proximal A3 left anterior cerebral artery which is now severe. 3. Additional intracranial atherosclerotic stenoses without interval change, most notably as follows. 4. 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 5. Moderate focal stenosis within the cavernous right ICA. 6. Moderate stenosis within the A2 right ACA. 7. Moderate stenosis within the proximal P2 left PCA. Electronically Signed   By: Kellie Simmering DO   On: 02/16/2020 14:15   MR BRAIN WO CONTRAST  Result Date: 02/16/2020 CLINICAL DATA:  Transient ischemic attack (TIA), right-sided weakness. EXAM: MRI HEAD WITHOUT CONTRAST MRA HEAD WITHOUT CONTRAST TECHNIQUE: Multiplanar, multiecho pulse sequences of the brain and surrounding structures were obtained without intravenous contrast. Angiographic images of the head were obtained using MRA technique without contrast. COMPARISON:  Noncontrast head CT 02/16/2020, brain MRI 12/11/2019. CT angiogram head/neck 07/12/2019. FINDINGS: MRI HEAD FINDINGS Brain: Stable, mild generalized parenchymal atrophy. There is a punctate acute cortical infarct within the posterior aspect of the left superior frontal gyrus (series 5, image 89). Immediately adjacent, there is an additional punctate acute infarct within the posterior left frontal lobe white matter. Additional punctate acute cortical infarct within the mid left frontal lobe within the left middle frontal gyrus (series 5, image 81). Acute cortically based infarct within the paramedian anterior left frontal lobe measuring 1.5 cm (for instance as seen on series 5, image 75). 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 Stable background moderate multifocal T2/FLAIR hyperintensity within the cerebral white matter which is nonspecific, but consistent with chronic small vessel ischemic disease. As  before, lesser chronic small vessel ischemic changes are present within the pons. Unchanged punctate foci of SWI signal loss within the left parietal lobe likely reflecting chronic microhemorrhages. No evidence of intracranial mass. No extra-axial fluid collection. No midline shift. Vascular: Reported below. Skull and upper cervical spine: No focal marrow lesion. Sinuses/Orbits: Visualized orbits show no acute finding. Mild ethmoid sinus mucosal thickening. No significant mastoid effusion. MRA HEAD FINDINGS The examination is mild to moderately motion degraded, limiting evaluation. The intracranial internal carotid arteries are patent. As before, there is a severe focal stenosis within the cavernous segment on the left and moderate focal stenosis within the cavernous segment on the right. Motion degradation limits evaluation of the M1 and proximal M2 middle cerebral arteries. No  M1 or proximal M2 occlusion or high-grade stenosis is identified. Motion degradation limits evaluation of the anterior cerebral arteries. Unchanged moderate stenosis within the A2 right anterior cerebral artery. Interval progression of a stenosis within the distal A2/proximal A3 left anterior cerebral artery, now severe (series 1, images 145-148). (Series 1029, image 1). The visualized intracranial vertebral arteries are patent without significant stenosis, as is the basilar artery. The posterior cerebral arteries are patent bilaterally. Unchanged moderate stenosis within the proximal P2 left posterior cerebral artery (series 1045, image 19). No intracranial aneurysm is identified. IMPRESSION: MRI brain: 1. 1.5 cm acute cortically based infarct within the anterior paramedian left frontal lobe. 2. 3 additional punctate acute infarcts within the left frontal lobe as described. 3. 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 4. Stable background mild generalized parenchymal atrophy and moderate chronic small  vessel ischemic disease. MRA head: 1. Examination limited by motion degradation. 2. Interval progression of a stenosis within the distal A2/proximal A3 left anterior cerebral artery which is now severe. 3. Additional intracranial atherosclerotic stenoses without interval change, most notably as follows. 4. 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 5. Moderate focal stenosis within the cavernous right ICA. 6. Moderate stenosis within the A2 right ACA. 7. Moderate stenosis within the proximal P2 left PCA. Electronically Signed   By: Kellie Simmering DO   On: 02/16/2020 14:15     EKG: Independently reviewed.  Sinus rhythm, QTC 439, possible LAE, nonspecific tube change   Assessment/Plan Principal Problem:   Stroke [facility]) Active Problems:   Essential hypertension   Depression   Sleep apnea   OSA (obstructive sleep apnea)   HLD (hyperlipidemia)   Fall   Stroke [facility]): pt has history of stroke with mild left-sided weakness, but now presents with right-sided weakness.  MRI of brain showed new stroke. Pt also has focal stenosis through out posterior and anterior circulation and ICA stenosis bilateral in the cavernous segment.  Dr. Irish Elders for neurology is consulted, who recommended to treat patient with aspirin and Plavix.  Patient is on baby aspirin at home.  -Placed on MedSurg bed for observation - Highly appreciated neurologist's consultation,will follow up recommendations as follows - ASA 81 mg and plavix daily - will hold oral Bp meds to allow permissive HTN in the setting of acute stroke  - continue lipitor - fasting lipid panel and HbA1c  - swallowing screen. If fails, will get SLP - PT/OT consult  Essential hypertension -hold home atenolol to allow permissive hypertension -IV hydralazine as needed for SBP> 220, DBP> 120  Depression -Continue home Zoloft  OSA (obstructive sleep apnea) -CPAP  HLD (hyperlipidemia) -Lipitor  Fall -PT/OT         DVT ppx: SQ  Lovenox Code Status: Full code Family Communication: not done, no family member is at bed side.  Disposition Plan:  Anticipate discharge back to previous environment Consults called: Dr. Irish Elders for neurology Admission status: Med-surg bed for obs  Status is: Observation  The patient remains OBS appropriate and will d/c before 2 midnights.  Dispo: The patient is from: Home              Anticipated d/c is to: Home              Anticipated d/c date is: 1 day              Patient currently is not medically stable to d/c.  Date of Service 02/16/2020    Ivor Costa [facility]   If 7PM-7AM, please contact night-coverage [email or web address] 02/16/2020, 2:55 PM

## 2020-02-16 NOTE — ED Notes (Signed)
Symptoms discussed with Dr. Cinda Quest who advised not calling a code stroke at this time, but will continue with ordered protocols.

## 2020-02-17 ENCOUNTER — Encounter: Payer: Self-pay | Admitting: Internal Medicine

## 2020-02-17 ENCOUNTER — Observation Stay: Payer: Medicare HMO

## 2020-02-17 DIAGNOSIS — G8191 Hemiplegia, unspecified affecting right dominant side: Secondary | ICD-10-CM | POA: Diagnosis present

## 2020-02-17 DIAGNOSIS — F329 Major depressive disorder, single episode, unspecified: Secondary | ICD-10-CM

## 2020-02-17 DIAGNOSIS — Z20822 Contact with and (suspected) exposure to covid-19: Secondary | ICD-10-CM | POA: Diagnosis present

## 2020-02-17 DIAGNOSIS — Z7982 Long term (current) use of aspirin: Secondary | ICD-10-CM | POA: Diagnosis not present

## 2020-02-17 DIAGNOSIS — K589 Irritable bowel syndrome without diarrhea: Secondary | ICD-10-CM | POA: Diagnosis present

## 2020-02-17 DIAGNOSIS — I639 Cerebral infarction, unspecified: Secondary | ICD-10-CM | POA: Diagnosis present

## 2020-02-17 DIAGNOSIS — M1711 Unilateral primary osteoarthritis, right knee: Secondary | ICD-10-CM | POA: Diagnosis not present

## 2020-02-17 DIAGNOSIS — M25461 Effusion, right knee: Secondary | ICD-10-CM | POA: Diagnosis not present

## 2020-02-17 DIAGNOSIS — Z79899 Other long term (current) drug therapy: Secondary | ICD-10-CM | POA: Diagnosis not present

## 2020-02-17 DIAGNOSIS — I69354 Hemiplegia and hemiparesis following cerebral infarction affecting left non-dominant side: Secondary | ICD-10-CM | POA: Diagnosis not present

## 2020-02-17 DIAGNOSIS — Z96652 Presence of left artificial knee joint: Secondary | ICD-10-CM | POA: Diagnosis present

## 2020-02-17 DIAGNOSIS — I6523 Occlusion and stenosis of bilateral carotid arteries: Secondary | ICD-10-CM | POA: Diagnosis present

## 2020-02-17 DIAGNOSIS — Z96641 Presence of right artificial hip joint: Secondary | ICD-10-CM | POA: Diagnosis present

## 2020-02-17 DIAGNOSIS — E782 Mixed hyperlipidemia: Secondary | ICD-10-CM | POA: Diagnosis present

## 2020-02-17 DIAGNOSIS — M25561 Pain in right knee: Secondary | ICD-10-CM | POA: Diagnosis present

## 2020-02-17 DIAGNOSIS — E669 Obesity, unspecified: Secondary | ICD-10-CM | POA: Diagnosis present

## 2020-02-17 DIAGNOSIS — K219 Gastro-esophageal reflux disease without esophagitis: Secondary | ICD-10-CM | POA: Diagnosis present

## 2020-02-17 DIAGNOSIS — I351 Nonrheumatic aortic (valve) insufficiency: Secondary | ICD-10-CM | POA: Diagnosis present

## 2020-02-17 DIAGNOSIS — Z6833 Body mass index (BMI) 33.0-33.9, adult: Secondary | ICD-10-CM | POA: Diagnosis not present

## 2020-02-17 DIAGNOSIS — J309 Allergic rhinitis, unspecified: Secondary | ICD-10-CM | POA: Diagnosis present

## 2020-02-17 DIAGNOSIS — I10 Essential (primary) hypertension: Secondary | ICD-10-CM | POA: Diagnosis present

## 2020-02-17 DIAGNOSIS — G4733 Obstructive sleep apnea (adult) (pediatric): Secondary | ICD-10-CM | POA: Diagnosis present

## 2020-02-17 DIAGNOSIS — Z9049 Acquired absence of other specified parts of digestive tract: Secondary | ICD-10-CM | POA: Diagnosis not present

## 2020-02-17 DIAGNOSIS — E785 Hyperlipidemia, unspecified: Secondary | ICD-10-CM | POA: Diagnosis not present

## 2020-02-17 DIAGNOSIS — G459 Transient cerebral ischemic attack, unspecified: Secondary | ICD-10-CM | POA: Diagnosis not present

## 2020-02-17 DIAGNOSIS — Z885 Allergy status to narcotic agent status: Secondary | ICD-10-CM | POA: Diagnosis not present

## 2020-02-17 DIAGNOSIS — W19XXXA Unspecified fall, initial encounter: Secondary | ICD-10-CM | POA: Diagnosis present

## 2020-02-17 DIAGNOSIS — K760 Fatty (change of) liver, not elsewhere classified: Secondary | ICD-10-CM | POA: Diagnosis present

## 2020-02-17 LAB — CBC WITH DIFFERENTIAL/PLATELET
Abs Immature Granulocytes: 0.05 10*3/uL (ref 0.00–0.07)
Basophils Absolute: 0 10*3/uL (ref 0.0–0.1)
Basophils Relative: 0 %
Eosinophils Absolute: 0.1 10*3/uL (ref 0.0–0.5)
Eosinophils Relative: 1 %
HCT: 43 % (ref 36.0–46.0)
Hemoglobin: 14.2 g/dL (ref 12.0–15.0)
Immature Granulocytes: 1 %
Lymphocytes Relative: 15 %
Lymphs Abs: 1.4 10*3/uL (ref 0.7–4.0)
MCH: 30.9 pg (ref 26.0–34.0)
MCHC: 33 g/dL (ref 30.0–36.0)
MCV: 93.5 fL (ref 80.0–100.0)
Monocytes Absolute: 0.9 10*3/uL (ref 0.1–1.0)
Monocytes Relative: 9 %
Neutro Abs: 6.9 10*3/uL (ref 1.7–7.7)
Neutrophils Relative %: 74 %
Platelets: 214 10*3/uL (ref 150–400)
RBC: 4.6 MIL/uL (ref 3.87–5.11)
RDW: 13 % (ref 11.5–15.5)
WBC: 9.3 10*3/uL (ref 4.0–10.5)
nRBC: 0 % (ref 0.0–0.2)

## 2020-02-17 LAB — LIPID PANEL
Cholesterol: 105 mg/dL (ref 0–200)
HDL: 33 mg/dL — ABNORMAL LOW (ref >40)
LDL Cholesterol: 54 mg/dL (ref 0–99)
Total CHOL/HDL Ratio: 3.2 RATIO
Triglycerides: 91 mg/dL (ref <150)
VLDL: 18 mg/dL (ref 0–40)

## 2020-02-17 LAB — COMPREHENSIVE METABOLIC PANEL
ALT: 17 U/L (ref 0–44)
AST: 21 U/L (ref 15–41)
Albumin: 4 g/dL (ref 3.5–5.0)
Alkaline Phosphatase: 104 U/L (ref 38–126)
Anion gap: 10 (ref 5–15)
BUN: 13 mg/dL (ref 8–23)
CO2: 26 mmol/L (ref 22–32)
Calcium: 9.5 mg/dL (ref 8.9–10.3)
Chloride: 104 mmol/L (ref 98–111)
Creatinine, Ser: 0.78 mg/dL (ref 0.44–1.00)
GFR calc Af Amer: >60 mL/min (ref >60)
GFR calc non Af Amer: >60 mL/min (ref >60)
Glucose, Bld: 111 mg/dL — ABNORMAL HIGH (ref 70–99)
Potassium: 4.6 mmol/L (ref 3.5–5.1)
Sodium: 140 mmol/L (ref 135–145)
Total Bilirubin: 1.2 mg/dL (ref 0.3–1.2)
Total Protein: 6.9 g/dL (ref 6.5–8.1)

## 2020-02-17 LAB — PHOSPHORUS: Phosphorus: 3.2 mg/dL (ref 2.5–4.6)

## 2020-02-17 LAB — MAGNESIUM: Magnesium: 2.1 mg/dL (ref 1.7–2.4)

## 2020-02-17 LAB — HEMOGLOBIN A1C
Hgb A1c MFr Bld: 5.6 % (ref 4.8–5.6)
Mean Plasma Glucose: 114.02 mg/dL

## 2020-02-17 MED ORDER — CALCIUM CARBONATE ANTACID 500 MG PO CHEW: 1.0000 | CHEWABLE_TABLET | Freq: Two times a day (BID) | ORAL | Status: DC

## 2020-02-17 MED ORDER — CALCIUM CARBONATE ANTACID 500 MG PO CHEW
1.0000 | CHEWABLE_TABLET | Freq: Two times a day (BID) | ORAL | Status: DC
  Administered 2020-02-17 – 2020-02-18 (×2): 200 mg via ORAL
  Filled 2020-02-17 (×2): qty 1

## 2020-02-17 MED ORDER — DICLOFENAC SODIUM 1 % EX GEL
2.0000 g | Freq: Four times a day (QID) | CUTANEOUS | Status: DC
  Administered 2020-02-17 – 2020-02-18 (×2): 2 g via TOPICAL
  Filled 2020-02-17: qty 100

## 2020-02-17 MED ORDER — INFLUENZA VAC A&B SA ADJ QUAD 0.5 ML IM PRSY
0.5000 mL | PREFILLED_SYRINGE | Freq: Once | INTRAMUSCULAR | Status: DC
  Filled 2020-02-17: qty 0.5

## 2020-02-17 NOTE — Progress Notes (Signed)
PROGRESS NOTE    [name]  BHA:[id number] DOB: [birth year] DOA: 02/16/2020 PCP: Tonia Ghent, MD   Brief Narrative:  HPI per Dr. Ivor Costa on 02/16/20  [name] [name] is a 79 y.o. female with medical history significant of hypertension, hyperlipidemia, stroke, GERD, depression, anxiety, OSA on CPAP, IBS, who presents with right-sided weakness.  Patient states that she fell last night at about 1:30 accidentally. She then developed left-sided weakness in the arm and leg. Her left-sided weakness has almost resolved at arrival to the ED.  No facial droop or slurred speech. Pt did twist her right ankle when trying to get up. She has pain and swelling in right ankle. Patient denies any chest pain, shortness breath, cough, fever or chills.  No nausea vomiting, diarrhea, abdominal pain, symptoms of UTI.  ED Course: pt was found to have WBC 10.7, negative urinalysis, pending COVID-19 PCR, electrolytes renal function okay, temperature normal, blood pressure 119/69, heart rate 72, RR 15, oxygen saturation 95% on room air.  CT of head is negative for acute intracranial abnormalities.  X-ray of right ankle is negative for bony fracture.  Patient is placed on MedSurg bed for observation.  Dr. Irish Elders of neurology is consulted.  **Interim History Stroke work-up is currently pending and still waiting for her echocardiogram to be done.  PT OT recommending home health whenever is complete.  Neurology has started her on dual antiplatelet therapy and Plavix assay in the outpatient setting.  Also complained of right knee pain but did not show any fractures.   Assessment & Plan:   Principal Problem:   Stroke [facility]) Active Problems:   Essential hypertension   Depression   OSA (obstructive sleep apnea)   HLD (hyperlipidemia)   Fall  Acute CVA -pt has history of stroke with mild left-sided weakness, but now presents with right-sided weakness.   -CT Head w/o Contrast showed "Mild senescent changes.  Interval development of a remote appearing infarct within the right corona radiata. No evidence of acute intracranial hemorrhage or infarct." -MRI of brain showed new stroke. Pt also has focal stenosis through out posterior and anterior circulation andICA stenosis bilateral in the cavernous segment.   -MRI Formally read as "1.5 cm acute cortically based infarct within the anterior paramedian left frontal lobe. 3 additional punctate acute infarcts within the left frontal lobe as described. 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 Stable background mild generalized parenchymal atrophy and moderate chronic small vessel ischemic disease." -MRA of the Head w/o Contrast showed "Examination limited by motion degradation. Interval progression of a stenosis within the distal A2/proximal A3 left anterior cerebral artery which is now severe. Additional intracranial atherosclerotic stenoses without interval change, most notably as follows. 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  Moderate focal stenosis within the cavernous right ICA. Moderate stenosis within the A2 right ACA.  Moderate stenosis within the proximal P2 left PCA."  -Per discussion with Dr. Irish Elders no need for Carotid Dopplers or CTA Head and Neck -Ordered ECHOCardiogram to be done for Complete CVA workup and is pending -Dr. Irish Elders for neurology is consulted, who recommended to treat patient with aspirin and Plavix; Patient is on baby aspirin at home -Placed on MedSurg bed for observation but will change to Inpatient  - Highly appreciated neurologist's consultation,will follow up recommendations as follows - ASA 81 mg and Clopidogrel 75 mg podaily - will hold oral Bp meds to allow permissive HTN in the setting of acute stroke  -  C/w Atorvastatin 80 mg po qHS - fasting lipid panel and HbA1c done; FLP as below and HbA1c was 5.6 - swallowing screen. If fails, will get SLP - PT/OT consult  recommending Home Health -C/w Telemetry and c/w Neurochecks Per protocol  -Dr. Irish Elders recommending continuing dual antiplatelet therapy with aspirin and Plavix for now but in the long-term she may need Effient instead of Plavix or Brilinta and recommends a Plavix assay to be sent an outpatient at least 5 doses to see if she is responsive to Plavix  Essential Hypertension -hold home atenolol to allow permissive hypertension -IV hydralazine as needed for SBP> 220, DBP> 120 -Gradually normalize BP -Last BP reading was 114/57  Depression -Continue home Sertraline 25 mg p.o. daily  OSA (obstructive sleep apnea) -CPAP qHS ordered   HLD (Hyperlipidemia) -Panel done and showed a total cholesterol/HDL ratio 3.2, cholesterol level 105, HDL 33, LDL 54, triglycerides of 91, VLDL of 18 -Continue with atorvastatin 80 mg p.o. daily  Fall -PT/OT recommending Home Health PT/OT at D/C with 24/hr Supervision  Obesity -Complicates Prognosis and Care -Estimated body mass index is 33.2 kg/m as calculated from the following:   Height as of this encounter: 5' (1.524 m).   Weight as of this encounter: 77.1 kg. 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 Moderate joint effusion. Negative for Fracture." -Will order her some Voltaren gel -She will need to follow up with her Primary Orthopedic Surgeon Dr. Wynelle Link as an outpatient   GERD/Reflux -States That she takes Calicum Carbonate so it was ordered    DVT prophylaxis: Enoxaparin 40 mg sq q24h Code Status: FULL CODE  Family Communication: No family present at bedside  Disposition Plan: Pending completion of Stroke Workup will go home with Home Health PT/OT  Status is: Observation  The patient will require care spanning > 2 midnights and should be  moved to inpatient because: Ongoing diagnostic testing needed not appropriate for outpatient work up, Unsafe d/c plan and Inpatient level of care appropriate due to severity of illness  Dispo: The patient is from: Home              Anticipated d/c is to: Home              Anticipated d/c date is: 1 day              Patient currently is not medically stable to d/c.  Consultants:   Neurology Dr. Irish Elders   Procedures: ECHOCardiogram  Antimicrobials:  Anti-infectives (From admission, onward)   None        Subjective: Seen and examined at bedside and she is complaining of some right knee pain.  No chest pain, lightheadedness or dizziness.  Still feels some weakness.  No nausea or vomiting.  Felt okay.  No other concerns requested this time.  Objective: Vitals:   02/17/20 0838 02/17/20 1015 02/17/20 1118 02/17/20 1528  BP: 112/63 123/61 118/72 (!) 114/57  Pulse: 75 80 73 84  Resp: 17  17 18   Temp: 98.4 F (36.9 C)  97.7 F (36.5 C) 98.1 F (36.7 C)  TempSrc: Oral  Oral   SpO2: 95% 97% 97% 97%  Weight:      Height:        Intake/Output Summary (Last 24 hours) at 02/17/2020 1804 Last data filed at 02/17/2020 0900  Gross per 24 hour  Intake 120 ml  Output 300 ml  Net -180 ml   Filed Weights   02/16/20 0559  Weight: 77.1 kg   Examination: Physical Exam:  Constitutional: WN/WD obese Caucasian female in NAD and appears calm but does appear mildly uncomfortable Eyes: Lids and conjunctivae normal, sclerae anicteric  ENMT: External Ears, Nose appear normal. Grossly normal hearing.   Neck: Appears normal, supple, no cervical masses, normal ROM, no appreciable thyromegaly: No JVD Respiratory: Diminished to auscultation bilaterally, no wheezing, rales, rhonchi or crackles. Normal respiratory effort and patient is not tachypenic. No accessory muscle use.  Unlabored breathing Cardiovascular: RRR, no murmurs / rubs / gallops. S1 and S2 auscultated.  No appreciable lower extremity  edema Abdomen: Soft, non-tender, distended secondary body habitus.  Bowel sounds positive.  GU: Deferred. Musculoskeletal: No clubbing / cyanosis of digits/nails.  Right knee slightly swollen and right ankle was mildly bruised Skin: No rashes, lesions, ulcers on limited skin evaluation. No induration; Warm and dry.  Neurologic: CN 2-12 grossly intact with no focal deficits.  Romberg sign cerebellar reflexes not assessed.  Psychiatric: Normal judgment and insight. Alert and oriented x 3. Normal mood and appropriate affect.   Data Reviewed: I have personally reviewed following labs and imaging studies  CBC: Recent Labs  Lab 02/16/20 0607 02/17/20 0904  WBC 10.7* 9.3  NEUTROABS  --  6.9  HGB 15.3* 14.2  HCT 44.2 43.0  MCV 90.6 93.5  PLT 245 993   Basic Metabolic Panel: Recent Labs  Lab 02/16/20 0607 02/17/20 0904  NA 143 140  K 3.7 4.6  CL 107 104  CO2 24 26  GLUCOSE 137* 111*  BUN 15 13  CREATININE 0.83 0.78  CALCIUM 9.6 9.5  MG  --  2.1  PHOS  --  3.2   GFR: Estimated Creatinine Clearance: 54 mL/min (by C-G formula based on SCr of 0.78 mg/dL). Liver Function Tests: Recent Labs  Lab 02/16/20 0607 02/17/20 0904  AST 27 21  ALT 18 17  ALKPHOS 107 104  BILITOT 0.8 1.2  PROT 7.1 6.9  ALBUMIN 4.2 4.0   No results for input(s): LIPASE, AMYLASE in the last 168 hours. No results for input(s): AMMONIA in the last 168 hours. Coagulation Profile: No results for input(s): INR, PROTIME in the last 168 hours. Cardiac Enzymes: No results for input(s): CKTOTAL, CKMB, CKMBINDEX, TROPONINI in the last 168 hours. BNP (last 3 results) No results for input(s): PROBNP in the last 8760 hours. HbA1C: Recent Labs    02/17/20 0426  HGBA1C 5.6   CBG: No results for input(s): GLUCAP in the last 168 hours. Lipid Profile: Recent Labs    02/17/20 0426  CHOL 105  HDL 33*  LDLCALC 54  TRIG 91  CHOLHDL 3.2   Thyroid Function Tests: No results for input(s): TSH, T4TOTAL,  FREET4, T3FREE, THYROIDAB in the last 72 hours. Anemia Panel: No results for input(s): VITAMINB12, FOLATE, FERRITIN, TIBC, IRON, RETICCTPCT in the last 72 hours. Sepsis Labs: No results for input(s): PROCALCITON, LATICACIDVEN in the last 168 hours.  Recent Results (from the past 240 hour(s))  Respiratory Panel by RT PCR (Flu A&B, Covid) - Nasopharyngeal Swab     Status: None   Collection Time: 02/16/20  3:09 PM   Specimen: Nasopharyngeal Swab  Result Value Ref Range Status   SARS Coronavirus 2 by RT PCR NEGATIVE NEGATIVE Final    Comment: (NOTE) SARS-CoV-2 target nucleic acids are NOT DETECTED.  The SARS-CoV-2 RNA is  generally detectable in upper respiratoy specimens during the acute phase of infection. The lowest concentration of SARS-CoV-2 viral copies this assay can detect is 131 copies/mL. A negative result does not preclude SARS-Cov-2 infection and should not be used as the sole basis for treatment or other patient management decisions. A negative result may occur with  improper specimen collection/handling, submission of specimen other than nasopharyngeal swab, presence of viral mutation(s) within the areas targeted by this assay, and inadequate number of viral copies (<131 copies/mL). A negative result must be combined with clinical observations, patient history, and epidemiological information. The expected result is Negative.  Fact Sheet for Patients:  PinkCheek.be  Fact Sheet for Healthcare Providers:  GravelBags.it  This test is no t yet approved or cleared by the [place] FDA and  has been authorized for detection and/or diagnosis of SARS-CoV-2 by FDA under an Emergency Use Authorization (EUA). This EUA will remain  in effect (meaning this test can be used) for the duration of the COVID-19 declaration under Section 564(b)(1) of the Act, 21 U.S.C. section 360bbb-3(b)(1), unless the authorization is  terminated or revoked sooner.     Influenza A by PCR NEGATIVE NEGATIVE Final   Influenza B by PCR NEGATIVE NEGATIVE Final    Comment: (NOTE) The Xpert Xpress SARS-CoV-2/FLU/RSV assay is intended as an aid in  the diagnosis of influenza from Nasopharyngeal swab specimens and  should not be used as a sole basis for treatment. Nasal washings and  aspirates are unacceptable for Xpert Xpress SARS-CoV-2/FLU/RSV  testing.  Fact Sheet for Patients: PinkCheek.be  Fact Sheet for Healthcare Providers: GravelBags.it  This test is not yet approved or cleared by the [place] FDA and  has been authorized for detection and/or diagnosis of SARS-CoV-2 by  FDA under an Emergency Use Authorization (EUA). This EUA will remain  in effect (meaning this test can be used) for the duration of the  Covid-19 declaration under Section 564(b)(1) of the Act, 21  U.S.C. section 360bbb-3(b)(1), unless the authorization is  terminated or revoked. Performed at [facility], [address] [place], [place] [address]     RN Pressure Injury Documentation:     Estimated body mass index is 33.2 kg/m as calculated from the following:   Height as of this encounter: 5' (1.524 m).   Weight as of this encounter: 77.1 kg.  Malnutrition Type:      Malnutrition Characteristics:      Nutrition Interventions:    Radiology Studies: DG Knee 1-2 Views Right  Result Date: 02/17/2020 CLINICAL DATA:  Fall.  Knee pain EXAM: RIGHT KNEE - 1-2 VIEW COMPARISON:  None. 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 Moderate joint effusion. Negative for fracture IMPRESSION: Negative for fracture.  Joint effusion. Electronically Signed   By: Franchot Gallo M.D.   On: 02/17/2020 10:47   DG Ankle Complete Right  Result Date: 02/16/2020 CLINICAL DATA:  Right  ankle injury EXAM: RIGHT ANKLE - COMPLETE 3+ VIEW COMPARISON:  None. FINDINGS: Three view radiograph right ankle demonstrates normal alignment. No fracture or dislocation. The ankle mortise is intact. There is extensive soft tissue swelling seen superficial to the lateral malleolus. IMPRESSION: Soft tissue swelling.  No fracture or dislocation. Electronically Signed   By: Fidela Salisbury MD   On: 02/16/2020 06:28   CT Head Wo Contrast  Result Date: 02/16/2020 CLINICAL DATA:  Right-sided weakness EXAM: CT HEAD WITHOUT CONTRAST TECHNIQUE: Contiguous axial  images were obtained from the base of the skull through the vertex without intravenous contrast. COMPARISON:  07/11/2019 FINDINGS: Brain: Normal anatomic configuration. Parenchymal volume loss is commensurate with the patient's age. Mild subcortical and periventricular white matter changes are present likely reflecting the sequela of small vessel ischemia. Since the prior examination, there has developed a remote appearing infarct within the right corona radiata. No abnormal intra or extra-axial mass lesion or fluid collection. No abnormal mass effect or midline shift. No evidence of acute intracranial hemorrhage or infarct. Ventricular size is normal. Cerebellum unremarkable. Vascular: No asymmetric hyperdense vasculature at the skull base. Skull: Intact Sinuses/Orbits: Paranasal sinuses are clear. Orbits are unremarkable. Other: Mastoid air cells and middle ear cavities are clear. IMPRESSION: Mild senescent changes. Interval development of a remote appearing infarct within the right corona radiata. No evidence of acute intracranial hemorrhage or infarct. Electronically Signed   By: Fidela Salisbury MD   On: 02/16/2020 07:12   MR ANGIO HEAD WO CONTRAST  Result Date: 02/16/2020 CLINICAL DATA:  Transient ischemic attack (TIA), right-sided weakness. EXAM: MRI HEAD WITHOUT CONTRAST MRA HEAD WITHOUT CONTRAST TECHNIQUE: Multiplanar, multiecho pulse sequences of the  brain and surrounding structures were obtained without intravenous contrast. Angiographic images of the head were obtained using MRA technique without contrast. COMPARISON:  Noncontrast head CT 02/16/2020, brain MRI 12/11/2019. CT angiogram head/neck 07/12/2019. FINDINGS: MRI HEAD FINDINGS Brain: Stable, mild generalized parenchymal atrophy. There is a punctate acute cortical infarct within the posterior aspect of the left superior frontal gyrus (series 5, image 89). Immediately adjacent, there is an additional punctate acute infarct within the posterior left frontal lobe white matter. Additional punctate acute cortical infarct within the mid left frontal lobe within the left middle frontal gyrus (series 5, image 81). Acute cortically based infarct within the paramedian anterior left frontal lobe measuring 1.5 cm (for instance as seen on series 5, image 75). 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 Stable background moderate multifocal T2/FLAIR hyperintensity within the cerebral white matter which is nonspecific, but consistent with chronic small vessel ischemic disease. As before, lesser chronic small vessel ischemic changes are present within the pons. Unchanged punctate foci of SWI signal loss within the left parietal lobe likely reflecting chronic microhemorrhages. No evidence of intracranial mass. No extra-axial fluid collection. No midline shift. Vascular: Reported below. Skull and upper cervical spine: No focal marrow lesion. Sinuses/Orbits: Visualized orbits show no acute finding. Mild ethmoid sinus mucosal thickening. No significant mastoid effusion. MRA HEAD FINDINGS The examination is mild to moderately motion degraded, limiting evaluation. The intracranial internal carotid arteries are patent. As before, there is a severe focal stenosis within the cavernous segment on the left and moderate focal stenosis within the cavernous segment on the right. Motion  degradation limits evaluation of the M1 and proximal M2 middle cerebral arteries. No M1 or proximal M2 occlusion or high-grade stenosis is identified. Motion degradation limits evaluation of the anterior cerebral arteries. Unchanged moderate stenosis within the A2 right anterior cerebral artery. Interval progression of a stenosis within the distal A2/proximal A3 left anterior cerebral artery, now severe (series 1, images 145-148). (Series 1029, image 1). The visualized intracranial vertebral arteries are patent without significant stenosis, as is the basilar artery. The posterior cerebral arteries are patent bilaterally. Unchanged moderate stenosis within the proximal P2 left posterior cerebral artery (series 1045, image 19). No intracranial aneurysm is identified. IMPRESSION: MRI brain: 1. 1.5 cm acute cortically based infarct within the anterior paramedian left frontal lobe.  2. 3 additional punctate acute infarcts within the left frontal lobe as described. 3. 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 4. Stable background mild generalized parenchymal atrophy and moderate chronic small vessel ischemic disease. MRA head: 1. Examination limited by motion degradation. 2. Interval progression of a stenosis within the distal A2/proximal A3 left anterior cerebral artery which is now severe. 3. Additional intracranial atherosclerotic stenoses without interval change, most notably as follows. 4. 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 5. Moderate focal stenosis within the cavernous right ICA. 6. Moderate stenosis within the A2 right ACA. 7. Moderate stenosis within the proximal P2 left PCA. Electronically Signed   By: Kellie Simmering DO   On: 02/16/2020 14:15   MR BRAIN WO CONTRAST  Result Date: 02/16/2020 CLINICAL DATA:  Transient ischemic attack (TIA), right-sided weakness. EXAM: MRI HEAD WITHOUT CONTRAST MRA HEAD WITHOUT CONTRAST TECHNIQUE: Multiplanar, multiecho  pulse sequences of the brain and surrounding structures were obtained without intravenous contrast. Angiographic images of the head were obtained using MRA technique without contrast. COMPARISON:  Noncontrast head CT 02/16/2020, brain MRI 12/11/2019. CT angiogram head/neck 07/12/2019. FINDINGS: MRI HEAD FINDINGS Brain: Stable, mild generalized parenchymal atrophy. There is a punctate acute cortical infarct within the posterior aspect of the left superior frontal gyrus (series 5, image 89). Immediately adjacent, there is an additional punctate acute infarct within the posterior left frontal lobe white matter. Additional punctate acute cortical infarct within the mid left frontal lobe within the left middle frontal gyrus (series 5, image 81). Acute cortically based infarct within the paramedian anterior left frontal lobe measuring 1.5 cm (for instance as seen on series 5, image 75). 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 Stable background moderate multifocal T2/FLAIR hyperintensity within the cerebral white matter which is nonspecific, but consistent with chronic small vessel ischemic disease. As before, lesser chronic small vessel ischemic changes are present within the pons. Unchanged punctate foci of SWI signal loss within the left parietal lobe likely reflecting chronic microhemorrhages. No evidence of intracranial mass. No extra-axial fluid collection. No midline shift. Vascular: Reported below. Skull and upper cervical spine: No focal marrow lesion. Sinuses/Orbits: Visualized orbits show no acute finding. Mild ethmoid sinus mucosal thickening. No significant mastoid effusion. MRA HEAD FINDINGS The examination is mild to moderately motion degraded, limiting evaluation. The intracranial internal carotid arteries are patent. As before, there is a severe focal stenosis within the cavernous segment on the left and moderate focal stenosis within the cavernous segment on the  right. Motion degradation limits evaluation of the M1 and proximal M2 middle cerebral arteries. No M1 or proximal M2 occlusion or high-grade stenosis is identified. Motion degradation limits evaluation of the anterior cerebral arteries. Unchanged moderate stenosis within the A2 right anterior cerebral artery. Interval progression of a stenosis within the distal A2/proximal A3 left anterior cerebral artery, now severe (series 1, images 145-148). (Series 1029, image 1). The visualized intracranial vertebral arteries are patent without significant stenosis, as is the basilar artery. The posterior cerebral arteries are patent bilaterally. Unchanged moderate stenosis within the proximal P2 left posterior cerebral artery (series 1045, image 19). No intracranial aneurysm is identified. IMPRESSION: MRI brain: 1. 1.5 cm acute cortically based infarct within the anterior paramedian left frontal lobe. 2. 3 additional punctate acute infarcts within the left frontal lobe as described. 3. 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 4. Stable background mild generalized parenchymal atrophy and moderate chronic small vessel ischemic disease.  MRA head: 1. Examination limited by motion degradation. 2. Interval progression of a stenosis within the distal A2/proximal A3 left anterior cerebral artery which is now severe. 3. Additional intracranial atherosclerotic stenoses without interval change, most notably as follows. 4. 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 5. Moderate focal stenosis within the cavernous right ICA. 6. Moderate stenosis within the A2 right ACA. 7. Moderate stenosis within the proximal P2 left PCA. Electronically Signed   By: Kellie Simmering DO   On: 02/16/2020 14:15   Scheduled Meds:   stroke: mapping our early stages of recovery book   Does not apply Once   aspirin EC  81 mg Oral Daily   atorvastatin  80 mg Oral Daily   calcium carbonate  1 tablet Oral BID  WC   cholecalciferol  1,000 Units Oral Daily   clopidogrel  75 mg Oral Daily   enoxaparin (LOVENOX) injection  40 mg Subcutaneous Q24H   [START ON 02/18/2020] influenza vaccine adjuvanted  0.5 mL Intramuscular Once   loratadine  10 mg Oral Daily   sertraline  25 mg Oral Daily   Continuous Infusions:   LOS: 0 days   Kerney Elbe, DO [facility] PAGER is on AMION  If 7PM-7AM, please contact night-coverage [email or web address]

## 2020-02-17 NOTE — Progress Notes (Signed)
Subjective: still complaining of pain the the RLE and mild weakness    Past Medical History:  Diagnosis Date  . Arthritis    knees  . Depression   . Fatty liver   . GERD (gastroesophageal reflux disease)   . Hyperlipidemia   . Hypertension   . IBS (irritable bowel syndrome)   . Obesity   . Pneumonia   . Sleep apnea    uses CPAP    Past Surgical History:  Procedure Laterality Date  . BLADDER SURGERY    . BREAST SURGERY     breast biopsy  . BROW LIFT Bilateral 04/14/2017   Procedure: BLEPHAROPLASTY UPPER EYELID WITH EXCESS SKIN;  Surgeon: Karle Starch, MD;  Location: [facility];  Service: Ophthalmology;  Laterality: Bilateral;  . CATARACT EXTRACTION W/PHACO Left 02/05/2016   Procedure: CATARACT EXTRACTION PHACO AND INTRAOCULAR LENS PLACEMENT (IOC);  Surgeon: Birder Robson, MD;  Location: [facility];  Service: Ophthalmology;  Laterality: Left;  [place] 01:10 AP% 22.3 CDE 15.67 Fluid pack lot # [id number]  . CATARACT EXTRACTION W/PHACO Right 02/26/2016   Procedure: CATARACT EXTRACTION PHACO AND INTRAOCULAR LENS PLACEMENT (IOC);  Surgeon: Birder Robson, MD;  Location: [facility];  Service: Ophthalmology;  Laterality: Right;  [place] 57.4 AP% 24.0 CDE 13.75 Fluid Pack lot # [id number]  . CHOLECYSTECTOMY    . COLONOSCOPY WITH PROPOFOL N/A 12/18/2014   Procedure: COLONOSCOPY WITH PROPOFOL;  Surgeon: Manya Silvas, MD;  Location: [facility];  Service: Endoscopy;  Laterality: N/A;  . DEEP NECK LYMPH NODE BIOPSY / EXCISION    . JOINT REPLACEMENT    . KNEE ARTHROPLASTY Left 06/20/2015   Procedure: COMPUTER ASSISTED TOTAL KNEE ARTHROPLASTY;  Surgeon: Dereck Leep, MD;  Location: [facility];  Service: Orthopedics;  Laterality: Left;  . PTOSIS REPAIR Bilateral 04/14/2017   Procedure: PTOSIS REPAIR RESECT EX;  Surgeon: Karle Starch, MD;  Location: [place];  Service: Ophthalmology;  Laterality: Bilateral;  sleep apnea  . TONSILLECTOMY    . TOTAL HIP ARTHROPLASTY Right  07/21/2018   Procedure: TOTAL HIP ARTHROPLASTY ANTERIOR APPROACH;  Surgeon: Gaynelle Arabian, MD;  Location: [facility];  Service: Orthopedics;  Laterality: Right;    Family History  Problem Relation Age of Onset  . Heart attack Mother   . Dementia Father   . Aortic aneurysm Brother   . Colon cancer Neg Hx   . Breast cancer Neg Hx     Social History:  reports that she has never smoked. She has never used smokeless tobacco. She reports current alcohol use. She reports that she does not use drugs.  Allergies  Allergen Reactions  . Hydrocodone Nausea Only    Noted after surgery, may be able to tolerate with food    Medications: I have reviewed the patient's current medications.    Physical Examination: Blood pressure 112/63, pulse 75, temperature 98.4 F (36.9 C), temperature source Oral, resp. rate 17, height 5' (1.524 m), weight 77.1 kg, SpO2 95 %.    Neurological Examination   Mental Status: Alert, oriented, thought content appropriate.  Speech fluent without evidence of aphasia.  Able to follow 3 step commands without difficulty. Cranial Nerves: II: Discs flat bilaterally; Visual fields grossly normal, pupils equal, round, reactive to light and accommodation III,IV, VI: ptosis not present, extra-ocular motions intact bilaterally V,VII: smile symmetric, facial light touch sensation normal bilaterally VIII: hearing normal bilaterally IX,X: gag reflex present XI: bilateral shoulder shrug XII: midline tongue extension Motor: Symmetric except for RLE weakness and  pain. Some chronic component Sensory: Pinprick and light touch intact throughout, bilaterally Deep Tendon Reflexes: 1+ and symmetric throughout Plantars: Right: downgoing   Left: downgoing Cerebellar: normal finger-to-nose      Laboratory Studies:   Basic Metabolic Panel: Recent Labs  Lab 02/16/20 0607  NA 143  K 3.7  CL 107  CO2 24  GLUCOSE 137*  BUN 15  CREATININE 0.83  CALCIUM 9.6    Liver  Function Tests: Recent Labs  Lab 02/16/20 0607  AST 27  ALT 18  ALKPHOS 107  BILITOT 0.8  PROT 7.1  ALBUMIN 4.2   No results for input(s): LIPASE, AMYLASE in the last 168 hours. No results for input(s): AMMONIA in the last 168 hours.  CBC: Recent Labs  Lab 02/16/20 0607  WBC 10.7*  HGB 15.3*  HCT 44.2  MCV 90.6  PLT 245    Cardiac Enzymes: No results for input(s): CKTOTAL, CKMB, CKMBINDEX, TROPONINI in the last 168 hours.  BNP: Invalid input(s): POCBNP  CBG: No results for input(s): GLUCAP in the last 168 hours.  Microbiology: Results for orders placed or performed during the hospital encounter of 02/16/20  Respiratory Panel by RT PCR (Flu A&B, Covid) - Nasopharyngeal Swab     Status: None   Collection Time: 02/16/20  3:09 PM   Specimen: Nasopharyngeal Swab  Result Value Ref Range Status   SARS Coronavirus 2 by RT PCR NEGATIVE NEGATIVE Final    Comment: (NOTE) SARS-CoV-2 target nucleic acids are NOT DETECTED.  The SARS-CoV-2 RNA is generally detectable in upper respiratoy specimens during the acute phase of infection. The lowest concentration of SARS-CoV-2 viral copies this assay can detect is 131 copies/mL. A negative result does not preclude SARS-Cov-2 infection and should not be used as the sole basis for treatment or other patient management decisions. A negative result may occur with  improper specimen collection/handling, submission of specimen other than nasopharyngeal swab, presence of viral mutation(s) within the areas targeted by this assay, and inadequate number of viral copies (<131 copies/mL). A negative result must be combined with clinical observations, patient history, and epidemiological information. The expected result is Negative.  Fact Sheet for Patients:  PinkCheek.be  Fact Sheet for Healthcare Providers:  GravelBags.it  This test is no t yet approved or cleared by the [place] FDA and  has been authorized for detection and/or diagnosis of SARS-CoV-2 by FDA under an Emergency Use Authorization (EUA). This EUA will remain  in effect (meaning this test can be used) for the duration of the COVID-19 declaration under Section 564(b)(1) of the Act, 21 U.S.C. section 360bbb-3(b)(1), unless the authorization is terminated or revoked sooner.     Influenza A by PCR NEGATIVE NEGATIVE Final   Influenza B by PCR NEGATIVE NEGATIVE Final    Comment: (NOTE) The Xpert Xpress SARS-CoV-2/FLU/RSV assay is intended as an aid in  the diagnosis of influenza from Nasopharyngeal swab specimens and  should not be used as a sole basis for treatment. Nasal washings and  aspirates are unacceptable for Xpert Xpress SARS-CoV-2/FLU/RSV  testing.  Fact Sheet for Patients: PinkCheek.be  Fact Sheet for Healthcare Providers: GravelBags.it  This test is not yet approved or cleared by the [place] FDA and  has been authorized for detection and/or diagnosis of SARS-CoV-2 by  FDA under an Emergency Use Authorization (EUA). This EUA will remain  in effect (meaning this test can be used) for the duration of the  Covid-19 declaration under Section 564(b)(1) of the Act, 21  U.S.C. section 360bbb-3(b)(1), unless the authorization is  terminated or revoked. Performed at [facility], [address] [place], [place] [address]     Coagulation Studies: No results for input(s): LABPROT, INR in the last 72 hours.  Urinalysis:  Recent Labs  Lab 02/16/20 1221  COLORURINE STRAW*  LABSPEC 1.006  PHURINE 6.0  GLUCOSEU NEGATIVE  HGBUR NEGATIVE  BILIRUBINUR NEGATIVE  KETONESUR NEGATIVE  PROTEINUR NEGATIVE  NITRITE NEGATIVE  LEUKOCYTESUR NEGATIVE    Lipid Panel:     Component Value Date/Time   CHOL 105 02/17/2020 0426   CHOL 189 05/31/2015 1136   TRIG 91 02/17/2020 0426   HDL 33 (L) 02/17/2020 0426   HDL 38  (L) 05/31/2015 1136   CHOLHDL 3.2 02/17/2020 0426   VLDL 18 02/17/2020 0426   LDLCALC 54 02/17/2020 0426   LDLCALC 108 (H) 05/31/2015 1136    HgbA1C:  Lab Results  Component Value Date   HGBA1C 5.5 07/11/2019    Urine Drug Screen:      Component Value Date/Time   LABOPIA NONE DETECTED 07/11/2019 1100   COCAINSCRNUR NONE DETECTED 07/11/2019 1100   LABBENZ NONE DETECTED 07/11/2019 1100   AMPHETMU NONE DETECTED 07/11/2019 1100   THCU NONE DETECTED 07/11/2019 1100   LABBARB NONE DETECTED 07/11/2019 1100    Alcohol Level: No results for input(s): ETH in the last 168 hours.  Other results: EKG: normal EKG, normal sinus rhythm, unchanged from previous tracings.  Imaging: DG Ankle Complete Right  Result Date: 02/16/2020 CLINICAL DATA:  Right ankle injury EXAM: RIGHT ANKLE - COMPLETE 3+ VIEW COMPARISON:  None. FINDINGS: Three view radiograph right ankle demonstrates normal alignment. No fracture or dislocation. The ankle mortise is intact. There is extensive soft tissue swelling seen superficial to the lateral malleolus. IMPRESSION: Soft tissue swelling.  No fracture or dislocation. Electronically Signed   By: Fidela Salisbury MD   On: 02/16/2020 06:28   CT Head Wo Contrast  Result Date: 02/16/2020 CLINICAL DATA:  Right-sided weakness EXAM: CT HEAD WITHOUT CONTRAST TECHNIQUE: Contiguous axial images were obtained from the base of the skull through the vertex without intravenous contrast. COMPARISON:  07/11/2019 FINDINGS: Brain: Normal anatomic configuration. Parenchymal volume loss is commensurate with the patient's age. Mild subcortical and periventricular white matter changes are present likely reflecting the sequela of small vessel ischemia. Since the prior examination, there has developed a remote appearing infarct within the right corona radiata. No abnormal intra or extra-axial mass lesion or fluid collection. No abnormal mass effect or midline shift. No evidence of acute intracranial  hemorrhage or infarct. Ventricular size is normal. Cerebellum unremarkable. Vascular: No asymmetric hyperdense vasculature at the skull base. Skull: Intact Sinuses/Orbits: Paranasal sinuses are clear. Orbits are unremarkable. Other: Mastoid air cells and middle ear cavities are clear. IMPRESSION: Mild senescent changes. Interval development of a remote appearing infarct within the right corona radiata. No evidence of acute intracranial hemorrhage or infarct. Electronically Signed   By: Fidela Salisbury MD   On: 02/16/2020 07:12   MR ANGIO HEAD WO CONTRAST  Result Date: 02/16/2020 CLINICAL DATA:  Transient ischemic attack (TIA), right-sided weakness. EXAM: MRI HEAD WITHOUT CONTRAST MRA HEAD WITHOUT CONTRAST TECHNIQUE: Multiplanar, multiecho pulse sequences of the brain and surrounding structures were obtained without intravenous contrast. Angiographic images of the head were obtained using MRA technique without contrast. COMPARISON:  Noncontrast head CT 02/16/2020, brain MRI 12/11/2019. CT angiogram head/neck 07/12/2019. FINDINGS: MRI HEAD FINDINGS Brain: Stable, mild generalized parenchymal atrophy. There is a punctate acute  cortical infarct within the posterior aspect of the left superior frontal gyrus (series 5, image 89). Immediately adjacent, there is an additional punctate acute infarct within the posterior left frontal lobe white matter. Additional punctate acute cortical infarct within the mid left frontal lobe within the left middle frontal gyrus (series 5, image 81). Acute cortically based infarct within the paramedian anterior left frontal lobe measuring 1.5 cm (for instance as seen on series 5, image 75). 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 Stable background moderate multifocal T2/FLAIR hyperintensity within the cerebral white matter which is nonspecific, but consistent with chronic small vessel ischemic disease. As before, lesser chronic small vessel  ischemic changes are present within the pons. Unchanged punctate foci of SWI signal loss within the left parietal lobe likely reflecting chronic microhemorrhages. No evidence of intracranial mass. No extra-axial fluid collection. No midline shift. Vascular: Reported below. Skull and upper cervical spine: No focal marrow lesion. Sinuses/Orbits: Visualized orbits show no acute finding. Mild ethmoid sinus mucosal thickening. No significant mastoid effusion. MRA HEAD FINDINGS The examination is mild to moderately motion degraded, limiting evaluation. The intracranial internal carotid arteries are patent. As before, there is a severe focal stenosis within the cavernous segment on the left and moderate focal stenosis within the cavernous segment on the right. Motion degradation limits evaluation of the M1 and proximal M2 middle cerebral arteries. No M1 or proximal M2 occlusion or high-grade stenosis is identified. Motion degradation limits evaluation of the anterior cerebral arteries. Unchanged moderate stenosis within the A2 right anterior cerebral artery. Interval progression of a stenosis within the distal A2/proximal A3 left anterior cerebral artery, now severe (series 1, images 145-148). (Series 1029, image 1). The visualized intracranial vertebral arteries are patent without significant stenosis, as is the basilar artery. The posterior cerebral arteries are patent bilaterally. Unchanged moderate stenosis within the proximal P2 left posterior cerebral artery (series 1045, image 19). No intracranial aneurysm is identified. IMPRESSION: MRI brain: 1. 1.5 cm acute cortically based infarct within the anterior paramedian left frontal lobe. 2. 3 additional punctate acute infarcts within the left frontal lobe as described. 3. 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 4. Stable background mild generalized parenchymal atrophy and moderate chronic small vessel ischemic disease. MRA head:  1. Examination limited by motion degradation. 2. Interval progression of a stenosis within the distal A2/proximal A3 left anterior cerebral artery which is now severe. 3. Additional intracranial atherosclerotic stenoses without interval change, most notably as follows. 4. 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 5. Moderate focal stenosis within the cavernous right ICA. 6. Moderate stenosis within the A2 right ACA. 7. Moderate stenosis within the proximal P2 left PCA. Electronically Signed   By: Kellie Simmering DO   On: 02/16/2020 14:15   MR BRAIN WO CONTRAST  Result Date: 02/16/2020 CLINICAL DATA:  Transient ischemic attack (TIA), right-sided weakness. EXAM: MRI HEAD WITHOUT CONTRAST MRA HEAD WITHOUT CONTRAST TECHNIQUE: Multiplanar, multiecho pulse sequences of the brain and surrounding structures were obtained without intravenous contrast. Angiographic images of the head were obtained using MRA technique without contrast. COMPARISON:  Noncontrast head CT 02/16/2020, brain MRI 12/11/2019. CT angiogram head/neck 07/12/2019. FINDINGS: MRI HEAD FINDINGS Brain: Stable, mild generalized parenchymal atrophy. There is a punctate acute cortical infarct within the posterior aspect of the left superior frontal gyrus (series 5, image 89). Immediately adjacent, there is an additional punctate acute infarct within the posterior left frontal lobe white matter. Additional punctate acute cortical infarct  within the mid left frontal lobe within the left middle frontal gyrus (series 5, image 81). Acute cortically based infarct within the paramedian anterior left frontal lobe measuring 1.5 cm (for instance as seen on series 5, image 75). 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 Stable background moderate multifocal T2/FLAIR hyperintensity within the cerebral white matter which is nonspecific, but consistent with chronic small vessel ischemic disease. As before, lesser chronic  small vessel ischemic changes are present within the pons. Unchanged punctate foci of SWI signal loss within the left parietal lobe likely reflecting chronic microhemorrhages. No evidence of intracranial mass. No extra-axial fluid collection. No midline shift. Vascular: Reported below. Skull and upper cervical spine: No focal marrow lesion. Sinuses/Orbits: Visualized orbits show no acute finding. Mild ethmoid sinus mucosal thickening. No significant mastoid effusion. MRA HEAD FINDINGS The examination is mild to moderately motion degraded, limiting evaluation. The intracranial internal carotid arteries are patent. As before, there is a severe focal stenosis within the cavernous segment on the left and moderate focal stenosis within the cavernous segment on the right. Motion degradation limits evaluation of the M1 and proximal M2 middle cerebral arteries. No M1 or proximal M2 occlusion or high-grade stenosis is identified. Motion degradation limits evaluation of the anterior cerebral arteries. Unchanged moderate stenosis within the A2 right anterior cerebral artery. Interval progression of a stenosis within the distal A2/proximal A3 left anterior cerebral artery, now severe (series 1, images 145-148). (Series 1029, image 1). The visualized intracranial vertebral arteries are patent without significant stenosis, as is the basilar artery. The posterior cerebral arteries are patent bilaterally. Unchanged moderate stenosis within the proximal P2 left posterior cerebral artery (series 1045, image 19). No intracranial aneurysm is identified. IMPRESSION: MRI brain: 1. 1.5 cm acute cortically based infarct within the anterior paramedian left frontal lobe. 2. 3 additional punctate acute infarcts within the left frontal lobe as described. 3. 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 4. Stable background mild generalized parenchymal atrophy and moderate chronic small vessel ischemic  disease. MRA head: 1. Examination limited by motion degradation. 2. Interval progression of a stenosis within the distal A2/proximal A3 left anterior cerebral artery which is now severe. 3. Additional intracranial atherosclerotic stenoses without interval change, most notably as follows. 4. 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 5. Moderate focal stenosis within the cavernous right ICA. 6. Moderate stenosis within the A2 right ACA. 7. Moderate stenosis within the proximal P2 left PCA. Electronically Signed   By: Kellie Simmering DO   On: 02/16/2020 14:15     Assessment/Plan:  79 y.o. female with hx  Of HTN, depression, HLD presents with episode of right-sided weakness that resulted in fall last night.  States that she started feeling symptoms around 130 when she woke up.  The symptoms lasted the better part of our but got better.  currently back to baseline. On ASA at home.    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  - will need prolonged dual anti platelet therapy for now with ASA and plavix(started). In long term might need Effient instead of plavix.  Plavix assay will need to be sent as out patient or at least 5 doses to see  if she is responsive to plavix.   -d/c planning based on pt/ot   02/17/2020, 9:20 AM

## 2020-02-17 NOTE — Progress Notes (Signed)
Patient states she does use cpap at home however will decline one here in hospital as she feels she will not be able to sleep since mask and unit is different from hers.  Will have RN to call if she changes her mind.

## 2020-02-17 NOTE — Progress Notes (Addendum)
OT Cancellation Note  Patient Details Name: [name] [name] MRN: [id number] DOB: [birth year]   Cancelled Treatment:    Reason Eval/Treat Not Completed: Other (comment). Consult received, chart reviewed. Pt with staff for knee imaging. Will re-attempt at later date/time as pt is available and medically appropriate.  Jeni Salles, MPH, MS, OTR/L ascom [phone] 02/17/20, 10:28 AM

## 2020-02-17 NOTE — Progress Notes (Signed)
Patient arrived to ortho unit rm 143-A. Patient oriented to room. Personal belongings at bedside.

## 2020-02-17 NOTE — Evaluation (Signed)
Occupational Therapy Evaluation Patient Details Name: [name] [name] MRN: [id number] DOB: 1940-10-05 Today's Date: 02/17/2020    History of Present Illness Pt is a 79 y.o. female presenting to hospital 9/23 with R sided weakness resulting in fall; pt with R ankle injury from fall.  MRI of brain showing 1.5 cm acute cortically based infarct within anterior paramedian L frontal lobe; 3 additional punctate acute infarcts within L frontal lobe.  R ankle imaging negative for fx/dislocation of R ankle (did show extensive soft tissue swelling superficial to lateral malleolus).  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   Clinical Impression   Pt was seen for OT evaluation this date. Prior to hospital admission, pt was independent with ADL, using a RW at times. Pt lives by herself but has family who can assist as needed. Currently pt demonstrates impairments as described below (See OT problem list) which functionally limit her ability to perform ADL/self-care tasks at baseline independence. Pt currently requires MIN A for LB ADL tasks, primarily limited by R knee and ankle pain. Pt/dtr educated in falls prevention strategies, home/routines modifications, and stress mgt to maximize pt's independence/safety with ADL and IADL tasks. Pt would benefit from skilled OT services to address noted impairments and functional limitations (see below for any additional details) in order to maximize safety and independence while minimizing falls risk and caregiver burden. Upon hospital discharge, recommend HHOT to maximize pt safety and return to functional independence during meaningful occupations of daily life.    Follow Up Recommendations  Home health OT;Supervision/Assistance - 24 hour    Equipment Recommendations  None recommended by OT    Recommendations for Other Services        Precautions / Restrictions Precautions Precautions: Fall Restrictions Weight Bearing Restrictions: No      Mobility Bed Mobility Overal bed mobility: Needs Assistance Bed Mobility: Supine to Sit     Supine to sit: Supervision;HOB elevated     General bed mobility comments: deferred, up in recliner  Transfers Overall transfer level: Needs assistance Equipment used: Rolling walker (2 wheeled) Transfers: Sit to/from [facility] Sit to Stand: Min guard (x1 trial standing from bed and x1 trial standing from recliner) Stand pivot transfers: Min guard (stand step turn bed to [facility])       General transfer comment: deferred per pt/nursing request    Balance Overall balance assessment: Needs assistance Sitting-balance support: No upper extremity supported;Feet supported Sitting balance-Leahy Scale: Good Sitting balance - Comments: steady sitting reaching within BOS   Standing balance support: Single extremity supported Standing balance-Leahy Scale: Poor Standing balance comment: pt requiring at least single UE support for static standing balance                           ADL either performed or assessed with clinical judgement   ADL Overall ADL's : Needs assistance/impaired                                       General ADL Comments: Pt currently requires Min A for LB ADL tasks, limited by impairments in strength, balance, and RLE pain (knee, ankle)     Vision Patient Visual Report: No change from baseline       Perception  Praxis      Pertinent Vitals/Pain Pain Assessment: No/denies pain Pain Score: 2  (2/10 at rest; R knee and ankle pain increased with movement/activity) Pain Location: R knee and R ankle Pain Descriptors / Indicators: Tender;Guarding;Sharp Pain Intervention(s): Limited activity within patient's tolerance;Monitored during session;Repositioned;Patient requesting pain meds-RN notified;RN gave pain meds  during session     Hand Dominance Right   Extremity/Trunk Assessment Upper Extremity Assessment Upper Extremity Assessment: Generalized weakness;Overall [facility] for tasks assessed (grossly at least 4/5 bilat, very mild FMC difference, L marginally worse than R.)   Lower Extremity Assessment Lower Extremity Assessment: Defer to PT evaluation RLE Deficits / Details: hip flexion 3-/5; knee flexion and extension 3-/5 (limited d/t R knee pain); ankle DF 2+/5 (limited d/t R ankle pain); no tone abnormality noted R hip area (deferred R ankle and knee d/t pain); unable to assess R LE coordination d/t R knee and ankle pain RLE: Unable to fully assess due to pain LLE Deficits / Details: 4+/5 hip flexion, 4+/5 knee flexion/extension, and 4+/5 ankle DF; at least 3/5 AROM ankle PF; no tone abnormality noted L LE LLE Coordination: WNL   Cervical / Trunk Assessment Cervical / Trunk Assessment: Normal   Communication Communication Communication: No difficulties   Cognition Arousal/Alertness: Awake/alert Behavior During Therapy: WFL for tasks assessed/performed Overall Cognitive Status: Within Functional Limits for tasks assessed                                     General Comments  R ankle in ace wrap bandage; mild swelling to R knee and ankle noted    Exercises Other Exercises Other Exercises: Pt/dtr educated in falls prevention strategies, home/routines modifications, and stress mgt to maximize pt's independence/safety with ADL and IADL tasks   Shoulder Instructions      Home Living Family/patient expects to be discharged to:: Private residence Living Arrangements: Alone Available Help at Discharge: Family;Available 24 hours/day Type of Home:  (condo) Home Access: Level entry     Home Layout: One level               Home Equipment: Walker - 2 wheels;Bedside commode;Wheelchair - manual;Other (comment)   Additional Comments: has a bed rail      Prior  Functioning/Environment Level of Independence: Independent with assistive device(s)        Comments: Used RW initially in the morning (d/t h/o bulging disc) but then transitioned to no AD use.        OT Problem List: Decreased strength;Pain;Decreased coordination;Decreased knowledge of use of DME or AE      OT Treatment/Interventions: Self-care/ADL training;Therapeutic exercise;Therapeutic activities;Neuromuscular education;DME and/or AE instruction;Patient/family education;Balance training    OT Goals(Current goals can be found in the care plan section) Acute Rehab OT Goals Patient Stated Goal: be able to walk better and go home OT Goal Formulation: With patient/family Time For Goal Achievement: 03/02/20 Potential to Achieve Goals: Good ADL Goals Pt Will Perform Lower Body Dressing: with modified independence;sit to/from stand;with adaptive equipment Pt Will Transfer to Toilet: with modified independence;ambulating (LRAD for amb) Additional ADL Goal #1: Pt will verbalize plan to implement at least 1 learned stress mgt strategy for ADL/IADL tasks.  OT Frequency: Min 1X/week   Barriers to D/C:            Co-evaluation              AM-PAC OT "6 Clicks" Daily  Activity     Outcome Measure Help from another person eating meals?: None Help from another person taking care of personal grooming?: None Help from another person toileting, which includes using toliet, bedpan, or urinal?: A Little Help from another person bathing (including washing, rinsing, drying)?: A Little Help from another person to put on and taking off regular upper body clothing?: None Help from another person to put on and taking off regular lower body clothing?: A Little 6 Click Score: 21   End of Session    Activity Tolerance: Patient tolerated treatment well Patient left: in chair;with chair alarm set;with call bell/phone within reach;with family/visitor present  OT Visit Diagnosis: Other  abnormalities of gait and mobility (R26.89);History of falling (Z91.81);Muscle weakness (generalized) (M62.81);Pain Pain - Right/Left: Right Pain - part of body: Hip;Knee                Time: 1101-1131 OT Time Calculation (min): 30 min Charges:  OT General Charges $OT Visit: 1 Visit OT Evaluation $OT Eval Moderate Complexity: 1 Mod OT Treatments $Self Care/Home Management : 8-22 mins $Therapeutic Activity: 8-22 mins  Jeni Salles, MPH, MS, OTR/L ascom [phone] 02/17/20, 12:30 PM

## 2020-02-17 NOTE — TOC Initial Note (Signed)
Transition of Care [facility]) - Initial/Assessment Note    Patient Details  Name: [name] [name] MRN: [id number] Date of Birth: 04-01-1941  Transition of Care [facility]) CM/SW Contact:    Shelbie Ammons, RN Phone Number: 02/17/2020, 2:10 PM  Clinical Narrative:  RNCM met with patient at bedside. Patient sitting up finishing lunch and reports to feeling some better. Discussed with patient that discharge planning would be talked about and she was ok with that. Patient reports that she has hesitations about home health services because she has had it in the past and did not have the best experience. Spent time talking about what home health is and is not for and how services would typically work. Patient is agreeable to this CM setting up home health but does not have any preferences as to who as long as it is not [facility]. Patient does not need any equipment set up at this time.  RNCM reached out to [place] with [facility] and she will accept the referral.              Expected Discharge Plan: [place] Barriers to Discharge: No Barriers Identified   Patient Goals and CMS Choice     Choice offered to / list presented to : Patient  Expected Discharge Plan and Services Expected Discharge Plan: [place] Acute Care Choice: [place] arrangements for the past 2 months: Single Family Home                           HH Arranged: PT, OT, Nurse's Aide [place] Agency: [place] ([place]) Date HH Agency Contacted: 02/17/20 Time [place]: 57 Representative spoke with at [place]: [place] Arrangements/Services Living arrangements for the past 2 months: [place] with:: Self Patient language and need for interpreter reviewed:: Yes Do you feel safe going back to the place where you live?: Yes      Need for Family Participation in Patient Care: Yes (Comment) Care giver support system in place?:  Yes (comment)   Criminal Activity/Legal Involvement Pertinent to Current Situation/Hospitalization: No - Comment as needed  Activities of Daily Living Home Assistive Devices/Equipment: None ADL Screening (condition at time of admission) Patient's cognitive ability adequate to safely complete daily activities?: Yes Is the patient deaf or have difficulty hearing?: No Does the patient have difficulty seeing, even when wearing glasses/contacts?: No Does the patient have difficulty concentrating, remembering, or making decisions?: No Patient able to express need for assistance with ADLs?: Yes Does the patient have difficulty dressing or bathing?: No Independently performs ADLs?: Yes (appropriate for developmental age) Does the patient have difficulty walking or climbing stairs?: No Weakness of Legs: Right Weakness of Arms/Hands: Right  Permission Sought/Granted                  Emotional Assessment Appearance:: Appears stated age Attitude/Demeanor/Rapport: Engaged Affect (typically observed): Appropriate, Calm Orientation: : Oriented to Self, Oriented to Place, Oriented to  Time, Oriented to Situation Alcohol / Substance Use: Not Applicable Psych Involvement: No (comment)  Admission diagnosis:  Stroke [facility]) [I63.9] Cerebrovascular accident (CVA), unspecified mechanism ([place]) [I63.9] Patient Active Problem List   Diagnosis Date Noted  . HLD (hyperlipidemia) 02/16/2020  . Stroke ([place]) 02/16/2020  . Fall 02/16/2020  . Abdominal wall hernia 10/30/2019  . Change in voice 10/30/2019  . History of stroke 09/14/2019  .  Spinal stenosis 07/24/2019  . Mixed hyperlipidemia   . OSA (obstructive sleep apnea)   . Dysfunction of eustachian tube 05/15/2019  . Other social stressor 03/29/2019  . 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 Diarrhea 06/20/2018  . Joint pain 06/20/2018  . 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 Medicare annual wellness visit,  subsequent 09/15/2017  . SUI (stress urinary incontinence, female) 08/07/2017  . Back pain 06/16/2016  . Advance care planning 01/07/2016  . Sleep apnea   . 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 Allergic rhinitis 11/24/2014  . Adult BMI 30+ 11/24/2014  . Depression 11/24/2014  . Insomnia 08/23/2009  . Headache, migraine 05/10/2009  . HERPES SIMPLEX INFECTION 08/14/2006  . HYPERCHOLESTEROLEMIA 08/14/2006  . Anxiety state 08/14/2006  . Essential hypertension 08/14/2006  . HIATAL HERNIA 08/14/2006  . IRRITABLE BOWEL SYNDROME 08/14/2006  . 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 Primary osteoarthritis of right knee 08/14/2006  . PLANTAR FASCIITIS 08/14/2006   PCP:  Tonia Ghent, MD Pharmacy:   [place], [place] - [address] [place] [address] Phone: [phone] Fax: [phone]     Social Determinants of Health (SDOH) Interventions    Readmission Risk Interventions No flowsheet data found.

## 2020-02-17 NOTE — Evaluation (Signed)
Physical Therapy Evaluation Patient Details Name: [name] [name] MRN: [id number] DOB: February 26, 1941 Today's Date: 02/17/2020   History of Present Illness  Pt is a 79 y.o. female presenting to hospital 9/23 with R sided weakness resulting in fall; pt with R ankle injury from fall.  MRI of brain showing 1.5 cm acute cortically based infarct within anterior paramedian L frontal lobe; 3 additional punctate acute infarcts within L frontal lobe.  R ankle imaging negative for fx/dislocation of R ankle (did show extensive soft tissue swelling superficial to lateral malleolus).  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  Clinical Impression  Prior to hospital admission, pt was modified independent with functional mobility (used RW initially in morning d/t h/o "bulging disc" but then transitioned to no AD for ambulation; lives alone in [place] (level entry); pt's daughter reports family can assist pt 24/7.  Currently pt is SBA semi-supine to sitting edge of bed; CGA with transfers; and CGA to ambulate 5 feet with RW.  Deferred further activity d/t pt c/o R knee (as well as R ankle) pain with R LE movement and activity (pt either keeping R LE NWB'ing or with minimal WB'ing during sessions activities)--MD present end of session and notified (MD ordered R knee imaging).  Pt would benefit from skilled PT to address noted impairments and functional limitations (see below for any additional details).  Upon hospital discharge, pt would benefit from HHPT and 24/7 assist.    Follow Up Recommendations Home health PT;Supervision/Assistance - 24 hour    Equipment Recommendations  Rolling walker with 5" wheels;3in1 (PT);Wheelchair (measurements PT);Wheelchair cushion (measurements PT) (pt has RW, BSC, and manual w/c at home already)    Recommendations for Other Services OT consult      Precautions / Restrictions Precautions Precautions: Fall Restrictions Weight Bearing Restrictions: No      Mobility  Bed Mobility Overal bed mobility: Needs Assistance Bed Mobility: Supine to Sit     Supine to sit: Supervision;HOB elevated     General bed mobility comments: increased effort to perform on own; SBA for safety  Transfers Overall transfer level: Needs assistance Equipment used: Rolling walker (2 wheeled) Transfers: Sit to/from [facility] Sit to Stand: Min guard (x1 trial standing from bed and x1 trial standing from recliner) Stand pivot transfers: Min guard (stand step turn bed to [facility])       General transfer comment: vc's for UE/LE positioning and overall technique; increased effort to stand on own  Ambulation/Gait Ambulation/Gait assistance: Min guard Gait Distance (Feet): 5 Feet Assistive device: Rolling walker (2 wheeled) Gait Pattern/deviations: Antalgic Gait velocity: decreased   General Gait Details: pt at times hopping on L LE or with minimal WB'ing through R LE (d/t R hip and knee pain); vc's for increasing UE support through RW to offweight R LE which improved pt's comfort and gait technique  Stairs            Wheelchair Mobility    Modified Rankin (Stroke Patients Only)       Balance Overall balance assessment: Needs assistance Sitting-balance support: No upper extremity supported;Feet supported Sitting balance-Leahy Scale: Good Sitting balance - Comments: steady sitting reaching within BOS   Standing balance support: Single extremity supported Standing balance-Leahy Scale: Poor Standing balance comment: pt requiring at least single UE support for static standing balance  Pertinent Vitals/Pain Pain Assessment: 0-10 Pain Score: 2  (2/10 at rest; R knee and ankle pain increased with movement/activity) Pain Location: R knee and R ankle Pain Descriptors / Indicators:  Tender;Guarding;Sharp Pain Intervention(s): Limited activity within patient's tolerance;Monitored during session;Repositioned;Patient requesting pain meds-RN notified;RN gave pain meds during session  Beginning of session pt's BP 134/75 with HR 81 bpm and O2 sats 98% on room air; end of session pt's BP 123/61 with HR 80 bpm and O2 sats 97% on room air.    Home Living Family/patient expects to be discharged to:: Private residence Living Arrangements: Alone Available Help at Discharge: Family;Available 24 hours/day Type of Home:  (condo) Home Access: Level entry     Home Layout: One level Home Equipment: Walker - 2 wheels;Bedside commode;Wheelchair - manual      Prior Function Level of Independence: Independent with assistive device(s)         Comments: Used RW initially in the morning (d/t h/o bulging disc) but then transitioned to no AD use.     Hand Dominance        Extremity/Trunk Assessment   Upper Extremity Assessment Upper Extremity Assessment: Defer to OT evaluation (good B hand grip strength, elbow flexion/extension AROM, and shoulder flexion AROM)    Lower Extremity Assessment Lower Extremity Assessment: RLE deficits/detail;LLE deficits/detail (light touch intact B LE's; intact B LE proprioception) RLE Deficits / Details: hip flexion 3-/5; knee flexion and extension 3-/5 (limited d/t R knee pain); ankle DF 2+/5 (limited d/t R ankle pain); no tone abnormality noted R hip area (deferred R ankle and knee d/t pain); unable to assess R LE coordination d/t R knee and ankle pain RLE: Unable to fully assess due to pain LLE Deficits / Details: 4+/5 hip flexion, 4+/5 knee flexion/extension, and 4+/5 ankle DF; at least 3/5 AROM ankle PF; no tone abnormality noted L LE LLE Coordination: WNL    Cervical / Trunk Assessment Cervical / Trunk Assessment: Normal  Communication   Communication: No difficulties  Cognition Arousal/Alertness: Awake/alert Behavior During Therapy: WFL  for tasks assessed/performed Overall Cognitive Status: Within Functional Limits for tasks assessed                                        General Comments General comments (skin integrity, edema, etc.): R ankle in ace wrap bandage; mild swelling to R knee and ankle noted Nursing cleared pt for participation in physical therapy.  Pt agreeable to PT session.  Pt's daughter present end of session.   Exercises     Assessment/Plan    PT Assessment Patient needs continued PT services  PT Problem List Decreased strength       PT Treatment Interventions DME instruction;Gait training;Functional mobility training;Therapeutic activities;Therapeutic exercise;Balance training;Patient/family education    PT Goals (Current goals can be found in the Care Plan section)  Acute Rehab PT Goals Patient Stated Goal: to improve R LE pain and mobility PT Goal Formulation: With patient Time For Goal Achievement: 03/02/20 Potential to Achieve Goals: Good    Frequency 7X/week   Barriers to discharge        Co-evaluation               AM-PAC PT "6 Clicks" Mobility  Outcome Measure Help needed turning from your back to your side while in a flat bed without using bedrails?: None Help needed moving from lying on your back to sitting  on the side of a flat bed without using bedrails?: None Help needed moving to and from a bed to a chair (including a wheelchair)?: A Little Help needed standing up from a chair using your arms (e.g., wheelchair or bedside chair)?: A Little Help needed to walk in hospital room?: A Little Help needed climbing 3-5 steps with a railing? : A Lot 6 Click Score: 19    End of Session Equipment Utilized During Treatment: Gait belt Activity Tolerance: Patient limited by pain Patient left: in chair;with call bell/phone within reach;with chair alarm set;with family/visitor present;Other (comment) (B heels floating via pillow support) Nurse Communication:  Mobility status;Precautions;Weight bearing status;Patient requests pain meds;Other (comment) (pt's R knee and ankle pain; pt requesting purewick be put back in and also wanting something d/t indigestion) PT Visit Diagnosis: History of falling (Z91.81);Other abnormalities of gait and mobility (R26.89);Muscle weakness (generalized) (M62.81);Pain;Hemiplegia and hemiparesis Hemiplegia - Right/Left: Right Hemiplegia - dominant/non-dominant: Dominant Hemiplegia - caused by: Cerebral infarction Pain - Right/Left: Right Pain - part of body: Ankle and joints of foot (R knee)    Time: 3552-1747 PT Time Calculation (min) (ACUTE ONLY): 50 min   Charges:   PT Evaluation $PT Eval Low Complexity: 1 Low PT Treatments $Therapeutic Activity: 23-37 mins       Leitha Bleak, PT 02/17/20, 11:25 AM

## 2020-02-18 ENCOUNTER — Inpatient Hospital Stay
Admit: 2020-02-18 | Discharge: 2020-02-18 | Disposition: A | Discharge status: Home / Self Care | Payer: Medicare HMO | Attending: Internal Medicine | Admitting: Internal Medicine

## 2020-02-18 LAB — CBC WITH DIFFERENTIAL/PLATELET
Abs Immature Granulocytes: 0.03 K/uL (ref 0.00–0.07)
Basophils Absolute: 0.1 K/uL (ref 0.0–0.1)
Basophils Relative: 1 %
Eosinophils Absolute: 0.2 K/uL (ref 0.0–0.5)
Eosinophils Relative: 3 %
HCT: 40.5 % (ref 36.0–46.0)
Hemoglobin: 13.2 g/dL (ref 12.0–15.0)
Immature Granulocytes: 0 %
Lymphocytes Relative: 23 %
Lymphs Abs: 1.6 K/uL (ref 0.7–4.0)
MCH: 30.9 pg (ref 26.0–34.0)
MCHC: 32.6 g/dL (ref 30.0–36.0)
MCV: 94.8 fL (ref 80.0–100.0)
Monocytes Absolute: 0.7 K/uL (ref 0.1–1.0)
Monocytes Relative: 10 %
Neutro Abs: 4.2 K/uL (ref 1.7–7.7)
Neutrophils Relative %: 63 %
Platelets: 204 K/uL (ref 150–400)
RBC: 4.27 MIL/uL (ref 3.87–5.11)
RDW: 13.1 % (ref 11.5–15.5)
WBC: 6.7 K/uL (ref 4.0–10.5)
nRBC: 0 % (ref 0.0–0.2)

## 2020-02-18 LAB — ECHOCARDIOGRAM COMPLETE
Area-P 1/2: 2.35 cm2
Height: 60 in
S' Lateral: 2.49 cm
Weight: 2720 oz

## 2020-02-18 LAB — COMPREHENSIVE METABOLIC PANEL WITH GFR
ALT: 15 U/L (ref 0–44)
AST: 18 U/L (ref 15–41)
Albumin: 3.7 g/dL (ref 3.5–5.0)
Alkaline Phosphatase: 96 U/L (ref 38–126)
Anion gap: 7 (ref 5–15)
BUN: 13 mg/dL (ref 8–23)
CO2: 28 mmol/L (ref 22–32)
Calcium: 9.2 mg/dL (ref 8.9–10.3)
Chloride: 107 mmol/L (ref 98–111)
Creatinine, Ser: 0.9 mg/dL (ref 0.44–1.00)
GFR calc Af Amer: >60 mL/min
GFR calc non Af Amer: >60 mL/min
Glucose, Bld: 106 mg/dL — ABNORMAL HIGH (ref 70–99)
Potassium: 4.5 mmol/L (ref 3.5–5.1)
Sodium: 142 mmol/L (ref 135–145)
Total Bilirubin: 1.1 mg/dL (ref 0.3–1.2)
Total Protein: 6.2 g/dL — ABNORMAL LOW (ref 6.5–8.1)

## 2020-02-18 LAB — PHOSPHORUS: Phosphorus: 3.5 mg/dL (ref 2.5–4.6)

## 2020-02-18 LAB — MAGNESIUM: Magnesium: 2.1 mg/dL (ref 1.7–2.4)

## 2020-02-18 LAB — NOVEL CORONAVIRUS, NAA: SARS-CoV-2, NAA: NOT DETECTED

## 2020-02-18 MED ORDER — CLOPIDOGREL BISULFATE 75 MG PO TABS
75.0000 mg | ORAL_TABLET | Freq: Every day | ORAL | 0 refills | Status: DC
Start: 2020-02-19 — End: 2020-03-01

## 2020-02-18 MED ORDER — SENNOSIDES-DOCUSATE SODIUM 8.6-50 MG PO TABS: 1.0000 | ORAL_TABLET | Freq: Every evening | ORAL | 0 refills | Status: DC | PRN

## 2020-02-18 MED ORDER — STROKE: EARLY STAGES OF RECOVERY BOOK: 1.0000 | Freq: Once | 0 refills | Status: AC

## 2020-02-18 NOTE — Discharge Summary (Signed)
Physician Discharge Summary  [name] [name] GEX:[id number] DOB: 12-08-1940 DOA: 02/16/2020  PCP: Tonia Ghent, MD  Admit date: 02/16/2020 Discharge date: 02/18/2020  Admitted From: Home Disposition: Home with Home Health PT/OT  Recommendations for Outpatient Follow-up:  1. Follow up with PCP in 1-2 weeks 2. Follow up with Neurology Dr. Irish Elders in 1-2 weeks 3. Follow up with Orthopedic Surgery Dr. Wynelle Link in 1-2 weeks for Knee and Ankle Pain 4. Please obtain CMP/CBC, Mag, Phos in one week 5. Please follow up on the following pending results:  Home Health: Yes  Equipment/Devices: RW, Estate agent, 3in1  Discharge Condition: Stable CODE STATUS: FULL CODE Diet recommendation: Heart Healthy Diet   Brief/Interim Summary: HPI per Dr. Ivor Costa on 02/16/20 [name] C Franksis a 79 y.o.femalewith medical history significant ofhypertension, hyperlipidemia, stroke, GERD, depression, anxiety, OSA on CPAP, IBS, who presents with right-sided weakness.  Patient states thatshe fell last night at about 1:30 accidentally. She then developedleft-sided weakness in the arm and leg.Herleft-sided weakness has almost resolved at arrival to the ED. No facial droop or slurred speech. Pt did twist her right anklewhentrying to get up. She has pain and swelling in right ankle.Patient denies any chest pain, shortness breath, cough, fever or chills. No nausea vomiting, diarrhea, abdominal pain, symptoms of UTI.  ED Course:pt was found to haveWBC 10.7, negative urinalysis, pending COVID-19 PCR, electrolytes renal function okay, temperature normal, blood pressure 119/69, heart rate 72, RR 15, oxygen saturation 95% on room air. CT of head is negative for acute intracranial abnormalities. X-ray of right ankle is negative for bony fracture. Patient is placed on MedSurg bed for observation. Dr. Irish Elders of neurology is consulted.  **Interim History Stroke work-up is currently pending and  still waiting for her echocardiogram to be done.  PT OT recommending home health whenever is complete.  Neurology has started her on dual antiplatelet therapy and Plavix assay in the outpatient setting.  Also complained of right knee pain but did not show any fractures.  ECHO done and showed a Normal EF with G1DD. Patient improved and ambulated with PT. She was deemed stable to be discharged and follow-up with PCP, neurology in the outpatient setting and she will be discharged on dual antiplatelet therapy and she will follow up with Dr. Irish Elders for a clopidogrel assay in the outpatient setting.  Discharge Diagnoses:  Principal Problem:   Stroke [facility]) Active Problems:   Essential hypertension   Depression   OSA (obstructive sleep apnea)   HLD (hyperlipidemia)   Fall  Acute CVA -pt has history of stroke with mild left-sided weakness, but now presents with right-sided weakness.  -CT Head w/o Contrast showed "Mild senescent changes. Interval development of a remote appearing infarct within the right corona radiata. No evidence of acute intracranial hemorrhage or infarct." -MRI of brain showed new stroke. Pt also hasfocal stenosis through out posterior and anterior circulationandICA stenosis bilateral in the cavernous segment. -MRI Formally read as "1.5 cm acute cortically based infarct within the anterior paramedian left frontal lobe. 3 additional punctate acute infarcts within the left frontal lobe as described. 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 Stable background mild generalized parenchymal atrophy and moderate chronic small vessel ischemic disease." -MRA of the Head w/o Contrast showed "Examination limited by motion degradation. Interval progression of a stenosis within the distal A2/proximal A3 left anterior cerebral artery which is now severe. Additional intracranial atherosclerotic stenoses without interval change, most notably as follows.  Severe focal stenosis  within the cavernous left ICA.  Moderate focal stenosis within the cavernous right ICA. Moderate stenosis within the A2 right ACA.  Moderate stenosis within the proximal P2 left PCA."  -Per discussion with Dr. Irish Elders no need for Carotid Dopplers or CTA Head and Neck -Ordered ECHOCardiogram to be done for Complete CVA workup and showed a normal EF of 60 to 65% but did show grade 1 diastolic dysfunction. It showed the right ventricular systolic function was normal and the mitral valve was normal in structure with no evidence of mitral valve regurgitation. The aortic valve was tricuspid and aortic valve regurgitation was not visualized. There is mild aortic valve sclerosis present without evidence of any aortic valve stenosis. -Dr. Irish Elders from neurology is consulted, who recommended to treat patient with aspirin and Plavix; Patient is on baby aspirin at home -Placed on MedSurg bed for observation but will change to Inpatient  - Highly appreciated neurologist's consultation,will follow up recommendations as follows - ASA 81 mg and Clopidogrel 75 mg podaily - will hold oral Bp meds to allow permissive HTN in the setting of acute stroke - C/w Atorvastatin 80 mg po qHS - fasting lipid panel and HbA1c done; FLP as below and HbA1c was 5.6 - swallowing screen. If fails, will get SLP - PT/OT consult recommending Home Health -C/w Telemetry and c/w Neurochecks Per protocol  -Dr. Irish Elders recommending continuing dual antiplatelet therapy with aspirin and Plavix for now but in the long-term she may need Effient instead of Plavix or Brilinta and recommends a Plavix assay to be sent an outpatient at least 5 doses to see if she is responsive to Plavix; follow-up with neurology in outpatient setting within a few weeks  Essential Hypertension -hold homeatenolol to allow permissive hypertension -IV hydralazine as needed for SBP>220, DBP>120 -Gradually normalize BP -Last BP reading was  114/57  Depression -Continue home Sertraline 25 mg p.o. daily  OSA (obstructive sleep apnea) -CPAP qHS ordered  but she did not use from the hospital CPAP last night  HLD (Hyperlipidemia) -Panel done and showed a total cholesterol/HDL ratio 3.2, cholesterol level 105, HDL 33, LDL 54, triglycerides of 91, VLDL of 18 -Continue with Atorvastatin 80 mg p.o. daily  Fall -PT/OT recommending Home Health PT/OT at D/C with 24/hr Supervision but she improved and ambulated with therapy and does not feel that she needs home health now but wrote for it in case she wants it..  Obesity -Complicates Prognosis and Care -Estimated body mass index is 33.2 kg/m as calculated from the following:   Height as of this encounter: 5' (1.524 m).   Weight as of this encounter: 77.1 kg. 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 Moderate joint effusion. Negative for Fracture." -Will order her some Voltaren gel -She will need to follow up with her Primary Orthopedic Surgeon Dr. Wynelle Link as an outpatient   GERD/Reflux -States That she takes Calicum Carbonate so it was ordered  -Resume Home Regimen   Discharge Instructions  Discharge Instructions    Call MD for:  difficulty breathing, headache or visual disturbances   Complete by: As directed    Call MD for:  extreme fatigue   Complete by: As directed    Call MD for:  hives   Complete by: As directed    Call MD for:  persistant dizziness or light-headedness  Complete by: As directed    Call MD for:  persistant nausea and vomiting   Complete by: As directed    Call MD for:  redness, tenderness, or signs of infection (pain, swelling, redness, odor or green/yellow discharge around incision site)   Complete by: As directed    Call MD for:  severe uncontrolled  pain   Complete by: As directed    Call MD for:  temperature >100.4   Complete by: As directed    Diet - low sodium heart healthy   Complete by: As directed    Discharge instructions   Complete by: As directed    You were cared for by a hospitalist during your hospital stay. If you have any questions about your discharge medications or the care you received while you were in the hospital after you are discharged, you can call the unit and ask to speak with the hospitalist on call if the hospitalist that took care of you is not available. Once you are discharged, your primary care physician will handle any further medical issues. Please note that NO REFILLS for any discharge medications will be authorized once you are discharged, as it is imperative that you return to your primary care physician (or establish a relationship with a primary care physician if you do not have one) for your aftercare needs so that they can reassess your need for medications and monitor your lab values.  Follow up with PCP and Neurology as an outpatient. Take all medications as prescribed. If symptoms change or worsen please return to the ED for evaluation   Increase activity slowly   Complete by: As directed      Allergies as of 02/18/2020      Reactions   Hydrocodone Nausea Only   Noted after surgery, may be able to tolerate with food      Medication List    STOP taking these medications   hyoscyamine 0.125 MG tablet Commonly known as: LEVSIN     TAKE these medications    stroke: mapping our early stages of recovery book Misc 1 each by Does not apply route once for 1 dose.   acetaminophen 500 MG tablet Commonly known as: TYLENOL Take 500 mg by mouth every 6 (six) hours as needed for moderate pain.   aspirin EC 81 MG tablet Take 81 mg by mouth daily.   atenolol 50 MG tablet Commonly known as: TENORMIN TAKE ONE TABLET TWICE DAILY   atorvastatin 80 MG tablet Commonly known as: LIPITOR TAKE ONE  TABLET BY MOUTH EVERY DAY AT 6PM   Biotin 10000 MCG Tabs Take 10,000 mcg by mouth daily.   cetirizine 10 MG tablet Commonly known as: ZYRTEC Take 10 mg by mouth daily as needed for allergies.   cholecalciferol 1000 units tablet Commonly known as: VITAMIN D Take 1,000 Units by mouth daily.   clopidogrel 75 MG tablet Commonly known as: PLAVIX Take 1 tablet (75 mg total) by mouth daily. Have Neurologist Refill Start taking on: February 19, 2020   fluticasone 50 MCG/ACT nasal spray Commonly known as: FLONASE Place 2 sprays into both nostrils daily as needed for allergies or rhinitis.   hydrOXYzine 10 MG tablet Commonly known as: ATARAX/VISTARIL Take 0.5-1 tablets (5-10 mg total) by mouth at bedtime as needed (for insomnia).   senna-docusate 8.6-50 MG tablet Commonly known as: Senokot-S Take 1 tablet by mouth at bedtime as needed for mild constipation.   sertraline 25 MG tablet Commonly known as: ZOLOFT TAKE  ONE TABLET BY MOUTH EVERY DAY            Durable Medical Equipment  (From admission, onward)         Start     Ordered   02/18/20 1452  DME 3-in-1  Once        02/18/20 1451   02/18/20 1450  DME Walker  Once       Question Answer Comment  Walker: With 5 Inch Wheels   Patient needs a walker to treat with the following condition Generalized weakness      02/18/20 1451   02/18/20 1449  DME standard manual wheelchair with seat cushion  Once       Comments: Patient suffers from Right Knee Pain which impairs their ability to perform daily activities like Ambulation in the home.  A Rolling walker or Kasandra Knudsen will not resolve issue with performing activities of daily living. A wheelchair will allow patient to safely perform daily activities. Patient can safely propel the wheelchair in the home or has a caregiver who can provide assistance. Length of need 6 months  Accessories: elevating leg rests (ELRs), wheel locks, extensions and anti-tippers.   02/18/20 1451           Follow-up Information    Tonia Ghent, MD. Call.   Specialty: Family Medicine Why: Follow up within 1-2 weeks Contact information: [place] [address] [phone]        Leotis Pain, MD. Call.   Specialty: Neurology Why: Follow up within 1-2 weeks Contact information: [place] [id number] [phone]              Allergies  Allergen Reactions  . Hydrocodone Nausea Only    Noted after surgery, may be able to tolerate with food   Consultations:  Neurology   Procedures/Studies: DG Knee 1-2 Views Right  Result Date: 02/17/2020 CLINICAL DATA:  Fall.  Knee pain EXAM: RIGHT KNEE - 1-2 VIEW COMPARISON:  None. 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 Moderate joint effusion. Negative for fracture IMPRESSION: Negative for fracture.  Joint effusion. Electronically Signed   By: Franchot Gallo M.D.   On: 02/17/2020 10:47   DG Ankle Complete Right  Result Date: 02/16/2020 CLINICAL DATA:  Right ankle injury EXAM: RIGHT ANKLE - COMPLETE 3+ VIEW COMPARISON:  None. FINDINGS: Three view radiograph right ankle demonstrates normal alignment. No fracture or dislocation. The ankle mortise is intact. There is extensive soft tissue swelling seen superficial to the lateral malleolus. IMPRESSION: Soft tissue swelling.  No fracture or dislocation. Electronically Signed   By: Fidela Salisbury MD   On: 02/16/2020 06:28   CT Head Wo Contrast  Result Date: 02/16/2020 CLINICAL DATA:  Right-sided weakness EXAM: CT HEAD WITHOUT CONTRAST TECHNIQUE: Contiguous axial images were obtained from the base of the skull through the vertex without intravenous contrast. COMPARISON:  07/11/2019 FINDINGS: Brain: Normal anatomic configuration. Parenchymal volume loss is commensurate with the patient's age. Mild subcortical and periventricular white  matter changes are present likely reflecting the sequela of small vessel ischemia. Since the prior examination, there has developed a remote appearing infarct within the right corona radiata. No abnormal intra or extra-axial mass lesion or fluid collection. No abnormal mass effect or midline shift. No evidence of acute intracranial hemorrhage or infarct. Ventricular size is normal. Cerebellum unremarkable. Vascular: No asymmetric hyperdense vasculature at the skull base. Skull:  Intact Sinuses/Orbits: Paranasal sinuses are clear. Orbits are unremarkable. Other: Mastoid air cells and middle ear cavities are clear. IMPRESSION: Mild senescent changes. Interval development of a remote appearing infarct within the right corona radiata. No evidence of acute intracranial hemorrhage or infarct. Electronically Signed   By: Fidela Salisbury MD   On: 02/16/2020 07:12   MR ANGIO HEAD WO CONTRAST  Result Date: 02/16/2020 CLINICAL DATA:  Transient ischemic attack (TIA), right-sided weakness. EXAM: MRI HEAD WITHOUT CONTRAST MRA HEAD WITHOUT CONTRAST TECHNIQUE: Multiplanar, multiecho pulse sequences of the brain and surrounding structures were obtained without intravenous contrast. Angiographic images of the head were obtained using MRA technique without contrast. COMPARISON:  Noncontrast head CT 02/16/2020, brain MRI 12/11/2019. CT angiogram head/neck 07/12/2019. FINDINGS: MRI HEAD FINDINGS Brain: Stable, mild generalized parenchymal atrophy. There is a punctate acute cortical infarct within the posterior aspect of the left superior frontal gyrus (series 5, image 89). Immediately adjacent, there is an additional punctate acute infarct within the posterior left frontal lobe white matter. Additional punctate acute cortical infarct within the mid left frontal lobe within the left middle frontal gyrus (series 5, image 81). Acute cortically based infarct within the paramedian anterior left frontal lobe measuring 1.5 cm (for instance  as seen on series 5, image 75). 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 Stable background moderate multifocal T2/FLAIR hyperintensity within the cerebral white matter which is nonspecific, but consistent with chronic small vessel ischemic disease. As before, lesser chronic small vessel ischemic changes are present within the pons. Unchanged punctate foci of SWI signal loss within the left parietal lobe likely reflecting chronic microhemorrhages. No evidence of intracranial mass. No extra-axial fluid collection. No midline shift. Vascular: Reported below. Skull and upper cervical spine: No focal marrow lesion. Sinuses/Orbits: Visualized orbits show no acute finding. Mild ethmoid sinus mucosal thickening. No significant mastoid effusion. MRA HEAD FINDINGS The examination is mild to moderately motion degraded, limiting evaluation. The intracranial internal carotid arteries are patent. As before, there is a severe focal stenosis within the cavernous segment on the left and moderate focal stenosis within the cavernous segment on the right. Motion degradation limits evaluation of the M1 and proximal M2 middle cerebral arteries. No M1 or proximal M2 occlusion or high-grade stenosis is identified. Motion degradation limits evaluation of the anterior cerebral arteries. Unchanged moderate stenosis within the A2 right anterior cerebral artery. Interval progression of a stenosis within the distal A2/proximal A3 left anterior cerebral artery, now severe (series 1, images 145-148). (Series 1029, image 1). The visualized intracranial vertebral arteries are patent without significant stenosis, as is the basilar artery. The posterior cerebral arteries are patent bilaterally. Unchanged moderate stenosis within the proximal P2 left posterior cerebral artery (series 1045, image 19). No intracranial aneurysm is identified. IMPRESSION: MRI brain: 1. 1.5 cm acute cortically based infarct within  the anterior paramedian left frontal lobe. 2. 3 additional punctate acute infarcts within the left frontal lobe as described. 3. 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 4. Stable background mild generalized parenchymal atrophy and moderate chronic small vessel ischemic disease. MRA head: 1. Examination limited by motion degradation. 2. Interval progression of a stenosis within the distal A2/proximal A3 left anterior cerebral artery which is now severe. 3. Additional intracranial atherosclerotic stenoses without interval change, most notably as follows. 4. 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 5. Moderate focal stenosis within the cavernous right ICA. 6. Moderate stenosis within the A2 right ACA. 7. Moderate stenosis  within the proximal P2 left PCA. Electronically Signed   By: Kellie Simmering DO   On: 02/16/2020 14:15   MR BRAIN WO CONTRAST  Result Date: 02/16/2020 CLINICAL DATA:  Transient ischemic attack (TIA), right-sided weakness. EXAM: MRI HEAD WITHOUT CONTRAST MRA HEAD WITHOUT CONTRAST TECHNIQUE: Multiplanar, multiecho pulse sequences of the brain and surrounding structures were obtained without intravenous contrast. Angiographic images of the head were obtained using MRA technique without contrast. COMPARISON:  Noncontrast head CT 02/16/2020, brain MRI 12/11/2019. CT angiogram head/neck 07/12/2019. FINDINGS: MRI HEAD FINDINGS Brain: Stable, mild generalized parenchymal atrophy. There is a punctate acute cortical infarct within the posterior aspect of the left superior frontal gyrus (series 5, image 89). Immediately adjacent, there is an additional punctate acute infarct within the posterior left frontal lobe white matter. Additional punctate acute cortical infarct within the mid left frontal lobe within the left middle frontal gyrus (series 5, image 81). Acute cortically based infarct within the paramedian anterior left frontal lobe measuring 1.5 cm  (for instance as seen on series 5, image 75). 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 Stable background moderate multifocal T2/FLAIR hyperintensity within the cerebral white matter which is nonspecific, but consistent with chronic small vessel ischemic disease. As before, lesser chronic small vessel ischemic changes are present within the pons. Unchanged punctate foci of SWI signal loss within the left parietal lobe likely reflecting chronic microhemorrhages. No evidence of intracranial mass. No extra-axial fluid collection. No midline shift. Vascular: Reported below. Skull and upper cervical spine: No focal marrow lesion. Sinuses/Orbits: Visualized orbits show no acute finding. Mild ethmoid sinus mucosal thickening. No significant mastoid effusion. MRA HEAD FINDINGS The examination is mild to moderately motion degraded, limiting evaluation. The intracranial internal carotid arteries are patent. As before, there is a severe focal stenosis within the cavernous segment on the left and moderate focal stenosis within the cavernous segment on the right. Motion degradation limits evaluation of the M1 and proximal M2 middle cerebral arteries. No M1 or proximal M2 occlusion or high-grade stenosis is identified. Motion degradation limits evaluation of the anterior cerebral arteries. Unchanged moderate stenosis within the A2 right anterior cerebral artery. Interval progression of a stenosis within the distal A2/proximal A3 left anterior cerebral artery, now severe (series 1, images 145-148). (Series 1029, image 1). The visualized intracranial vertebral arteries are patent without significant stenosis, as is the basilar artery. The posterior cerebral arteries are patent bilaterally. Unchanged moderate stenosis within the proximal P2 left posterior cerebral artery (series 1045, image 19). No intracranial aneurysm is identified. IMPRESSION: MRI brain: 1. 1.5 cm acute cortically based  infarct within the anterior paramedian left frontal lobe. 2. 3 additional punctate acute infarcts within the left frontal lobe as described. 3. 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 4. Stable background mild generalized parenchymal atrophy and moderate chronic small vessel ischemic disease. MRA head: 1. Examination limited by motion degradation. 2. Interval progression of a stenosis within the distal A2/proximal A3 left anterior cerebral artery which is now severe. 3. Additional intracranial atherosclerotic stenoses without interval change, most notably as follows. 4. 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 5. Moderate focal stenosis within the cavernous right ICA. 6. Moderate stenosis within the A2 right ACA. 7. Moderate stenosis within the proximal P2 left PCA. Electronically Signed   By: Kellie Simmering DO   On: 02/16/2020 14:15   ECHOCARDIOGRAM COMPLETE  Result Date: 02/18/2020    ECHOCARDIOGRAM REPORT   Patient Name:  [name] [name] Date of Exam: 02/18/2020 Medical Rec #:  [id number]      Height:       60.0 in Accession #:    [id number]     Weight:       170.0 lb Date of Birth:  15-Aug-1940      BSA:          1.742 m Patient Age:    48 years       BP:           115/71 mmHg Patient Gender: F              HR:           71 bpm. Exam Location:  [facility] Procedure: 2D Echo Indications:     STROKE 434.91/I163.9  History:         Patient has no prior history of Echocardiogram examinations and                  Patient has prior history of Echocardiogram examinations, most                  recent 07/13/2019. Risk Factors:Dyslipidemia and Hypertension.  Sonographer:     Avanell Shackleton Referring Phys:  [id number] Georgina Quint LATIF [facility] Diagnosing Phys: Dorris Carnes MD IMPRESSIONS  1. Left ventricular ejection fraction, by estimation, is 55 to 60%. The left ventricle has normal function. The left ventricle has no regional wall motion abnormalities. Left ventricular diastolic  parameters are consistent with Grade I diastolic dysfunction (impaired relaxation).  2. Right ventricular systolic function is normal. The right ventricular size is normal.  3. The mitral valve is normal in structure. No evidence of mitral valve regurgitation.  4. The aortic valve is tricuspid. Aortic valve regurgitation is not visualized. Mild aortic valve sclerosis is present, with no evidence of aortic valve stenosis.  5. The inferior vena cava is normal in size with greater than 50% respiratory variability, suggesting right atrial pressure of 3 mmHg. FINDINGS  Left Ventricle: Left ventricular ejection fraction, by estimation, is 55 to 60%. The left ventricle has normal function. The left ventricle has no regional wall motion abnormalities. The left ventricular internal cavity size was normal in size. There is  no left ventricular hypertrophy. Left ventricular diastolic parameters are consistent with Grade I diastolic dysfunction (impaired relaxation). Right Ventricle: The right ventricular size is normal. No increase in right ventricular wall thickness. Right ventricular systolic function is normal. Left Atrium: Left atrial size was normal in size. Right Atrium: Right atrial size was normal in size. Pericardium: There is no evidence of pericardial effusion. Mitral Valve: The mitral valve is normal in structure. No evidence of mitral valve regurgitation. Tricuspid Valve: The tricuspid valve is normal in structure. Tricuspid valve regurgitation is trivial. Aortic Valve: The aortic valve is tricuspid. Aortic valve regurgitation is not visualized. Mild aortic valve sclerosis is present, with no evidence of aortic valve stenosis. Pulmonic Valve: The pulmonic valve was normal in structure. Pulmonic valve regurgitation is trivial. Aorta: The aortic root is normal in size and structure. Venous: The inferior vena cava is normal in size with greater than 50% respiratory variability, suggesting right atrial pressure of 3  mmHg. IAS/Shunts: The interatrial septum was not assessed.  LEFT VENTRICLE PLAX 2D LVIDd:         3.55 cm  Diastology LVIDs:         2.49 cm  LV e' medial:    5.22 cm/s LV PW:  0.99 cm  LV E/e' medial:  14.7 LV IVS:        1.06 cm  LV e' lateral:   9.14 cm/s LVOT diam:     2.20 cm  LV E/e' lateral: 8.4 LVOT Area:     3.80 cm  RIGHT VENTRICLE             IVC RV S prime:     12.60 cm/s  IVC diam: 1.63 cm TAPSE (M-mode): 2.1 cm LEFT ATRIUM             Index       RIGHT ATRIUM           Index LA diam:        3.40 cm 1.95 cm/m  RA Area:     11.10 cm LA Vol (A2C):   33.9 ml 19.46 ml/m RA Volume:   25.20 ml  14.47 ml/m LA Vol (A4C):   21.1 ml 12.11 ml/m LA Biplane Vol: 27.5 ml 15.79 ml/m   AORTA Ao Root diam: 3.10 cm MITRAL VALVE                TRICUSPID VALVE MV Area (PHT): 2.35 cm     TR Peak grad:   31.4 mmHg MV Decel Time: 323 msec     TR Vmax:        280.00 cm/s MV E velocity: 76.60 cm/s MV A velocity: 110.00 cm/s  SHUNTS MV E/A ratio:  0.70         Systemic Diam: 2.20 cm Dorris Carnes MD Electronically signed by Dorris Carnes MD Signature Date/Time: 02/18/2020/1:35:04 PM    Final     ECHOCARDIOGRAM IMPRESSIONS    1. Left ventricular ejection fraction, by estimation, is 55 to 60%. The  left ventricle has normal function. The left ventricle has no regional  wall motion abnormalities. Left ventricular diastolic parameters are  consistent with Grade I diastolic  dysfunction (impaired relaxation).  2. Right ventricular systolic function is normal. The right ventricular  size is normal.  3. The mitral valve is normal in structure. No evidence of mitral valve  regurgitation.  4. The aortic valve is tricuspid. Aortic valve regurgitation is not  visualized. Mild aortic valve sclerosis is present, with no evidence of  aortic valve stenosis.  5. The inferior vena cava is normal in size with greater than 50%  respiratory variability, suggesting right atrial pressure of 3 mmHg.   FINDINGS   Left Ventricle: Left ventricular ejection fraction, by estimation, is 55  to 60%. The left ventricle has normal function. The left ventricle has no  regional wall motion abnormalities. The left ventricular internal cavity  size was normal in size. There is  no left ventricular hypertrophy. Left ventricular diastolic parameters  are consistent with Grade I diastolic dysfunction (impaired relaxation).   Right Ventricle: The right ventricular size is normal. No increase in  right ventricular wall thickness. Right ventricular systolic function is  normal.   Left Atrium: Left atrial size was normal in size.   Right Atrium: Right atrial size was normal in size.   Pericardium: There is no evidence of pericardial effusion.   Mitral Valve: The mitral valve is normal in structure. No evidence of  mitral valve regurgitation.   Tricuspid Valve: The tricuspid valve is normal in structure. Tricuspid  valve regurgitation is trivial.   Aortic Valve: The aortic valve is tricuspid. Aortic valve regurgitation is  not visualized. Mild aortic valve sclerosis is present, with no evidence  of aortic valve stenosis.   Pulmonic Valve: The pulmonic valve was normal in structure. Pulmonic valve  regurgitation is trivial.   Aorta: The aortic root is normal in size and structure.   Venous: The inferior vena cava is normal in size with greater than 50%  respiratory variability, suggesting right atrial pressure of 3 mmHg.   IAS/Shunts: The interatrial septum was not assessed.     LEFT VENTRICLE  PLAX 2D  LVIDd:     3.55 cm Diastology  LVIDs:     2.49 cm LV e' medial:  5.22 cm/s  LV PW:     0.99 cm LV E/e' medial: 14.7  LV IVS:    1.06 cm LV e' lateral:  9.14 cm/s  LVOT diam:   2.20 cm LV E/e' lateral: 8.4  LVOT Area:   3.80 cm     RIGHT VENTRICLE       IVC  RV S prime:   12.60 cm/s IVC diam: 1.63 cm  TAPSE (M-mode): 2.1 cm   LEFT ATRIUM        Index    RIGHT ATRIUM      Index  LA diam:    3.40 cm 1.95 cm/m RA Area:   11.10 cm  LA Vol (A2C):  33.9 ml 19.46 ml/m RA Volume:  25.20 ml 14.47 ml/m  LA Vol (A4C):  21.1 ml 12.11 ml/m  LA Biplane Vol: 27.5 ml 15.79 ml/m    AORTA  Ao Root diam: 3.10 cm   MITRAL VALVE        TRICUSPID VALVE  MV Area (PHT): 2.35 cm   TR Peak grad:  31.4 mmHg  MV Decel Time: 323 msec   TR Vmax:    280.00 cm/s  MV E velocity: 76.60 cm/s  MV A velocity: 110.00 cm/s SHUNTS  MV E/A ratio: 0.70     Systemic Diam: 2.20 cm   Subjective: Seen and examined at bedside and she felt better today. Denied any nausea or vomiting. Thinks that her knee pain and her ankle pain is improved. Ambulated with physical therapy without issues. Stable for discharge as she had an echocardiogram done and read. She will need to follow-up with PCP, neurology as well as orthopedic surgeon outpatient setting.    Discharge Exam: Vitals:   02/18/20 0845 02/18/20 1232  BP: 119/66 121/84  Pulse: 85 90  Resp: 16 16  Temp: 97.8 F (36.6 C) 98.8 F (37.1 C)  SpO2: 96% 96%   Vitals:   02/17/20 2349 02/18/20 0434 02/18/20 0845 02/18/20 1232  BP: 120/66 115/71 119/66 121/84  Pulse: 73 88 85 90  Resp: 16 16 16 16   Temp: 98 F (36.7 C) 98 F (36.7 C) 97.8 F (36.6 C) 98.8 F (37.1 C)  TempSrc: Oral Oral Oral Oral  SpO2: 96% 99% 96% 96%  Weight:      Height:       General: Pt is alert, awake, not in acute distress Cardiovascular: RRR, S1/S2 +, no rubs, no gallops Respiratory: Diminished bilaterally, no wheezing, no rhonchi; unlabored breathing Abdominal: Soft, NT, distended secondary body habitus, bowel sounds + Extremities: Had some mild right knee swelling. Right ankle bruising. No cyanosis  The results of significant diagnostics from this hospitalization (including imaging, microbiology, ancillary and laboratory) are listed below for reference.    Microbiology: Recent  Results (from the past 240 hour(s))  Novel Coronavirus, NAA ([facility])     Status: None   Collection Time: 02/15/20  2:05 PM   Specimen:  Nasopharyngeal(NP) swabs in vial transport medium   Nasopharynge  Screenin  Result Value Ref Range Status   SARS-CoV-2, NAA Not Detected Not Detected Final    Comment: This nucleic acid amplification test was developed and its performance characteristics determined by [employer]. Nucleic acid amplification tests include RT-PCR and TMA. This test has not been FDA cleared or approved. This test has been authorized by FDA under an Emergency Use Authorization (EUA). This test is only authorized for the duration of time the declaration that circumstances exist justifying the authorization of the emergency use of in vitro diagnostic tests for detection of SARS-CoV-2 virus and/or diagnosis of COVID-19 infection under section 564(b)(1) of the Act, 21 U.S.C. 185UDJ-4(H) (1), unless the authorization is terminated or revoked sooner. When diagnostic testing is negative, the possibility of a false negative result should be considered in the context of a patient's recent exposures and the presence of clinical signs and symptoms consistent with COVID-19. An individual without symptoms of COVID-19 and who is not shedding SARS-CoV-2 virus wo uld expect to have a negative (not detected) result in this assay.   Respiratory Panel by RT PCR (Flu A&B, Covid) - Nasopharyngeal Swab     Status: None   Collection Time: 02/16/20  3:09 PM   Specimen: Nasopharyngeal Swab  Result Value Ref Range Status   SARS Coronavirus 2 by RT PCR NEGATIVE NEGATIVE Final    Comment: (NOTE) SARS-CoV-2 target nucleic acids are NOT DETECTED.  The SARS-CoV-2 RNA is generally detectable in upper respiratoy specimens during the acute phase of infection. The lowest concentration of SARS-CoV-2 viral copies this assay can detect is 131 copies/mL. A negative result does not preclude  SARS-Cov-2 infection and should not be used as the sole basis for treatment or other patient management decisions. A negative result may occur with  improper specimen collection/handling, submission of specimen other than nasopharyngeal swab, presence of viral mutation(s) within the areas targeted by this assay, and inadequate number of viral copies (<131 copies/mL). A negative result must be combined with clinical observations, patient history, and epidemiological information. The expected result is Negative.  Fact Sheet for Patients:  PinkCheek.be  Fact Sheet for Healthcare Providers:  GravelBags.it  This test is no t yet approved or cleared by the [place] FDA and  has been authorized for detection and/or diagnosis of SARS-CoV-2 by FDA under an Emergency Use Authorization (EUA). This EUA will remain  in effect (meaning this test can be used) for the duration of the COVID-19 declaration under Section 564(b)(1) of the Act, 21 U.S.C. section 360bbb-3(b)(1), unless the authorization is terminated or revoked sooner.     Influenza A by PCR NEGATIVE NEGATIVE Final   Influenza B by PCR NEGATIVE NEGATIVE Final    Comment: (NOTE) The Xpert Xpress SARS-CoV-2/FLU/RSV assay is intended as an aid in  the diagnosis of influenza from Nasopharyngeal swab specimens and  should not be used as a sole basis for treatment. Nasal washings and  aspirates are unacceptable for Xpert Xpress SARS-CoV-2/FLU/RSV  testing.  Fact Sheet for Patients: PinkCheek.be  Fact Sheet for Healthcare Providers: GravelBags.it  This test is not yet approved or cleared by the [place] FDA and  has been authorized for detection and/or diagnosis of SARS-CoV-2 by  FDA under an Emergency Use Authorization (EUA). This EUA will remain  in effect (meaning this test can be used) for the duration of the   Covid-19 declaration under Section 564(b)(1) of the Act, 21  U.S.C. section 360bbb-3(b)(1), unless  the authorization is  terminated or revoked. Performed at [facility], [address] [place], [place] [address]     Labs: BNP (last 3 results) No results for input(s): BNP in the last 8760 hours. Basic Metabolic Panel: Recent Labs  Lab 02/16/20 0607 02/17/20 0904 02/18/20 0723  NA 143 140 142  K 3.7 4.6 4.5  CL 107 104 107  CO2 24 26 28   GLUCOSE 137* 111* 106*  BUN 15 13 13   CREATININE 0.83 0.78 0.90  CALCIUM 9.6 9.5 9.2  MG  --  2.1 2.1  PHOS  --  3.2 3.5   Liver Function Tests: Recent Labs  Lab 02/16/20 0607 02/17/20 0904 02/18/20 0723  AST 27 21 18   ALT 18 17 15   ALKPHOS 107 104 96  BILITOT 0.8 1.2 1.1  PROT 7.1 6.9 6.2*  ALBUMIN 4.2 4.0 3.7   No results for input(s): LIPASE, AMYLASE in the last 168 hours. No results for input(s): AMMONIA in the last 168 hours. CBC: Recent Labs  Lab 02/16/20 0607 02/17/20 0904 02/18/20 0723  WBC 10.7* 9.3 6.7  NEUTROABS  --  6.9 4.2  HGB 15.3* 14.2 13.2  HCT 44.2 43.0 40.5  MCV 90.6 93.5 94.8  PLT 245 214 204   Cardiac Enzymes: No results for input(s): CKTOTAL, CKMB, CKMBINDEX, TROPONINI in the last 168 hours. BNP: Invalid input(s): POCBNP CBG: No results for input(s): GLUCAP in the last 168 hours. D-Dimer No results for input(s): DDIMER in the last 72 hours. Hgb A1c Recent Labs    02/17/20 0426  HGBA1C 5.6   Lipid Profile Recent Labs    02/17/20 0426  CHOL 105  HDL 33*  LDLCALC 54  TRIG 91  CHOLHDL 3.2   Thyroid function studies No results for input(s): TSH, T4TOTAL, T3FREE, THYROIDAB in the last 72 hours.  Invalid input(s): FREET3 Anemia work up No results for input(s): VITAMINB12, FOLATE, FERRITIN, TIBC, IRON, RETICCTPCT in the last 72 hours. Urinalysis    Component Value Date/Time   COLORURINE STRAW (A) 02/16/2020 1221   APPEARANCEUR CLEAR (A) 02/16/2020 1221   APPEARANCEUR  Clear 11/26/2012 1630   LABSPEC 1.006 02/16/2020 1221   LABSPEC 1.009 11/26/2012 1630   PHURINE 6.0 02/16/2020 1221   GLUCOSEU NEGATIVE 02/16/2020 1221   GLUCOSEU Negative 11/26/2012 1630   HGBUR NEGATIVE 02/16/2020 1221   BILIRUBINUR NEGATIVE 02/16/2020 1221   BILIRUBINUR Neg 03/09/2017 1601   BILIRUBINUR Negative 11/26/2012 1630   KETONESUR NEGATIVE 02/16/2020 1221   PROTEINUR NEGATIVE 02/16/2020 1221   UROBILINOGEN 0.2 03/09/2017 1601   NITRITE NEGATIVE 02/16/2020 1221   LEUKOCYTESUR NEGATIVE 02/16/2020 1221   LEUKOCYTESUR Negative 11/26/2012 1630   Sepsis Labs Invalid input(s): PROCALCITONIN,  WBC,  LACTICIDVEN Microbiology Recent Results (from the past 240 hour(s))  Novel Coronavirus, NAA ([facility])     Status: None   Collection Time: 02/15/20  2:05 PM   Specimen: Nasopharyngeal(NP) swabs in vial transport medium   Nasopharynge  Screenin  Result Value Ref Range Status   SARS-CoV-2, NAA Not Detected Not Detected Final    Comment: This nucleic acid amplification test was developed and its performance characteristics determined by [employer]. Nucleic acid amplification tests include RT-PCR and TMA. This test has not been FDA cleared or approved. This test has been authorized by FDA under an Emergency Use Authorization (EUA). This test is only authorized for the duration of time the declaration that circumstances exist justifying the authorization of the emergency use of in vitro diagnostic tests for detection  of SARS-CoV-2 virus and/or diagnosis of COVID-19 infection under section 564(b)(1) of the Act, 21 U.S.C. 716RCV-8(L) (1), unless the authorization is terminated or revoked sooner. When diagnostic testing is negative, the possibility of a false negative result should be considered in the context of a patient's recent exposures and the presence of clinical signs and symptoms consistent with COVID-19. An individual without symptoms of COVID-19 and who is not  shedding SARS-CoV-2 virus wo uld expect to have a negative (not detected) result in this assay.   Respiratory Panel by RT PCR (Flu A&B, Covid) - Nasopharyngeal Swab     Status: None   Collection Time: 02/16/20  3:09 PM   Specimen: Nasopharyngeal Swab  Result Value Ref Range Status   SARS Coronavirus 2 by RT PCR NEGATIVE NEGATIVE Final    Comment: (NOTE) SARS-CoV-2 target nucleic acids are NOT DETECTED.  The SARS-CoV-2 RNA is generally detectable in upper respiratoy specimens during the acute phase of infection. The lowest concentration of SARS-CoV-2 viral copies this assay can detect is 131 copies/mL. A negative result does not preclude SARS-Cov-2 infection and should not be used as the sole basis for treatment or other patient management decisions. A negative result may occur with  improper specimen collection/handling, submission of specimen other than nasopharyngeal swab, presence of viral mutation(s) within the areas targeted by this assay, and inadequate number of viral copies (<131 copies/mL). A negative result must be combined with clinical observations, patient history, and epidemiological information. The expected result is Negative.  Fact Sheet for Patients:  PinkCheek.be  Fact Sheet for Healthcare Providers:  GravelBags.it  This test is no t yet approved or cleared by the [place] FDA and  has been authorized for detection and/or diagnosis of SARS-CoV-2 by FDA under an Emergency Use Authorization (EUA). This EUA will remain  in effect (meaning this test can be used) for the duration of the COVID-19 declaration under Section 564(b)(1) of the Act, 21 U.S.C. section 360bbb-3(b)(1), unless the authorization is terminated or revoked sooner.     Influenza A by PCR NEGATIVE NEGATIVE Final   Influenza B by PCR NEGATIVE NEGATIVE Final    Comment: (NOTE) The Xpert Xpress SARS-CoV-2/FLU/RSV assay is intended as an  aid in  the diagnosis of influenza from Nasopharyngeal swab specimens and  should not be used as a sole basis for treatment. Nasal washings and  aspirates are unacceptable for Xpert Xpress SARS-CoV-2/FLU/RSV  testing.  Fact Sheet for Patients: PinkCheek.be  Fact Sheet for Healthcare Providers: GravelBags.it  This test is not yet approved or cleared by the [place] FDA and  has been authorized for detection and/or diagnosis of SARS-CoV-2 by  FDA under an Emergency Use Authorization (EUA). This EUA will remain  in effect (meaning this test can be used) for the duration of the  Covid-19 declaration under Section 564(b)(1) of the Act, 21  U.S.C. section 360bbb-3(b)(1), unless the authorization is  terminated or revoked. Performed at [facility], [address] [place], [place] [address]    Time coordinating discharge: 35 minutes  SIGNED:  Kerney Elbe, DO [facility] 02/18/2020, 2:53 PM Pager is on [place]  If 7PM-7AM, please contact night-coverage [email or web address]

## 2020-02-18 NOTE — Progress Notes (Signed)
Physical Therapy Treatment Patient Details Name: [name] [name] MRN: [id number] DOB: 1941-02-11 Today's Date: 02/18/2020    History of Present Illness Pt is a 79 y.o. female presenting to hospital 9/23 with R sided weakness resulting in fall; pt with R ankle injury from fall.  MRI of brain showing 1.5 cm acute cortically based infarct within anterior paramedian L frontal lobe; 3 additional punctate acute infarcts within L frontal lobe.  R ankle imaging negative for fx/dislocation of R ankle (did show extensive soft tissue swelling superficial to lateral malleolus).  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    PT Comments    Patient is ready to try ambulation. She is independent with bed mobility and transfers with RW. She is able to ambulate 20 feet with step-to gait pattern and RW with slow gait speed. She reports R ankle pain increasing with distance and is able to get herself back into bed independently. She has a level entry into her home and has family to assist her at home. She doesn't want HHPT and has all equipment she needs at home. She will be DC from PT services.   Follow Up Recommendations        Equipment Recommendations   (Pt does not feel that she needs any HHPT)    Recommendations for Other Services       Precautions / Restrictions Restrictions Weight Bearing Restrictions: No    Mobility  Bed Mobility Overal bed mobility: Modified Independent Bed Mobility: Supine to Sit;Sit to Supine     Supine to sit: Supervision Sit to supine: Supervision      Transfers Overall transfer level: Modified independent Equipment used: Rolling walker (2 wheeled)   Sit to Stand: Supervision            Ambulation/Gait Ambulation/Gait assistance: Modified independent (Device/Increase time) Gait Distance (Feet): 20 Feet Assistive device:  Rolling walker (2 wheeled) Gait Pattern/deviations: Step-to pattern Gait velocity:  (decreased)       Stairs             Wheelchair Mobility    Modified Rankin (Stroke Patients Only)       Balance Overall balance assessment: Modified Independent                                          Cognition Arousal/Alertness: Awake/alert Behavior During Therapy: WFL for tasks assessed/performed Overall Cognitive Status: Within Functional Limits for tasks assessed                                        Exercises      General Comments        Pertinent Vitals/Pain Pain Assessment: Faces Faces Pain Scale: Hurts little more Pain Descriptors / Indicators: Aching Pain Intervention(s): Limited activity within patient's tolerance;Monitored during session    Home Living                      Prior Function            PT Goals (current goals can now be found in the care plan section) Acute Rehab PT Goals Patient Stated Goal: be able to walk better and go home  Progress towards PT goals: Progressing toward goals    Frequency    7X/week      PT Plan Current plan remains appropriate    Co-evaluation              AM-PAC PT "6 Clicks" Mobility   Outcome Measure  Help needed turning from your back to your side while in a flat bed without using bedrails?: None Help needed moving from lying on your back to sitting on the side of a flat bed without using bedrails?: None Help needed moving to and from a bed to a chair (including a wheelchair)?: None Help needed standing up from a chair using your arms (e.g., wheelchair or bedside chair)?: None Help needed to walk in hospital room?: None Help needed climbing 3-5 steps with a railing? : A Little 6 Click Score: 23    End of Session Equipment Utilized During Treatment: Gait belt Activity Tolerance: Patient limited by pain Patient left: in bed;with call bell/phone within  reach Nurse Communication: Mobility status PT Visit Diagnosis: Difficulty in walking, not elsewhere classified (R26.2);Pain Pain - Right/Left: Right Pain - part of body: Ankle and joints of foot     Time: 1445-1500 PT Time Calculation (min) (ACUTE ONLY): 15 min  Charges:  $Gait Training: 8-22 mins                        Alanson Puls, PT DPT 02/18/2020, 3:34 PM

## 2020-02-18 NOTE — Plan of Care (Signed)
  Problem: Education: Goal: Knowledge of General Education information will improve Description: Including pain rating scale, medication(s)/side effects and non-pharmacologic comfort measures Outcome: Progressing   Problem: Health Behavior/Discharge Planning: Goal: Ability to manage health-related needs will improve Outcome: Progressing   Problem: Clinical Measurements: Goal: Ability to maintain clinical measurements within normal limits will improve Outcome: Progressing Goal: Will remain free from infection Outcome: Progressing Goal: Diagnostic test results will improve Outcome: Progressing Goal: Respiratory complications will improve Outcome: Progressing Goal: Cardiovascular complication will be avoided Outcome: Progressing   Problem: Activity: Goal: Risk for activity intolerance will decrease Outcome: Progressing   Problem: Nutrition: Goal: Adequate nutrition will be maintained Outcome: Progressing   Problem: Coping: Goal: Level of anxiety will decrease Outcome: Progressing   Problem: Elimination: Goal: Will not experience complications related to bowel motility Outcome: Progressing Goal: Will not experience complications related to urinary retention Outcome: Progressing   Problem: Pain Managment: Goal: General experience of comfort will improve Outcome: Progressing   Problem: Safety: Goal: Ability to remain free from injury will improve Outcome: Progressing   Problem: Skin Integrity: Goal: Risk for impaired skin integrity will decrease Outcome: Progressing   Problem: Education: Goal: Knowledge of secondary prevention will improve Outcome: Progressing Goal: Knowledge of patient specific risk factors addressed and post discharge goals established will improve Outcome: Progressing Goal: Individualized Educational Video(s) Outcome: Progressing   Problem: Coping: Goal: Will identify appropriate support needs Outcome: Progressing   Problem:  Self-Care: Goal: Verbalization of feelings and concerns over difficulty with self-care will improve Outcome: Progressing Goal: Ability to communicate needs accurately will improve Outcome: Progressing   Problem: Nutrition: Goal: Dietary intake will improve Outcome: Progressing

## 2020-02-20 ENCOUNTER — Telehealth: Payer: Self-pay

## 2020-02-20 DIAGNOSIS — M25571 Pain in right ankle and joints of right foot: Secondary | ICD-10-CM | POA: Diagnosis not present

## 2020-02-20 DIAGNOSIS — I10 Essential (primary) hypertension: Secondary | ICD-10-CM | POA: Diagnosis not present

## 2020-02-20 DIAGNOSIS — F419 Anxiety disorder, unspecified: Secondary | ICD-10-CM | POA: Diagnosis not present

## 2020-02-20 DIAGNOSIS — G8911 Acute pain due to trauma: Secondary | ICD-10-CM | POA: Diagnosis not present

## 2020-02-20 DIAGNOSIS — F329 Major depressive disorder, single episode, unspecified: Secondary | ICD-10-CM | POA: Diagnosis not present

## 2020-02-20 DIAGNOSIS — K589 Irritable bowel syndrome without diarrhea: Secondary | ICD-10-CM | POA: Diagnosis not present

## 2020-02-20 DIAGNOSIS — M1711 Unilateral primary osteoarthritis, right knee: Secondary | ICD-10-CM | POA: Diagnosis not present

## 2020-02-20 DIAGNOSIS — I69351 Hemiplegia and hemiparesis following cerebral infarction affecting right dominant side: Secondary | ICD-10-CM | POA: Diagnosis not present

## 2020-02-20 DIAGNOSIS — K760 Fatty (change of) liver, not elsewhere classified: Secondary | ICD-10-CM | POA: Diagnosis not present

## 2020-02-20 NOTE — Telephone Encounter (Signed)
Transition Care Management Follow-up Telephone Call  Date of discharge and from where: 02/18/2020, [facility]  How have you been since you were released from the hospital? Patient states that she is doing fine and doesn't have any effects from this episode.   Any questions or concerns? No  Items Reviewed:  Did the pt receive and understand the discharge instructions provided? Yes   Medications obtained and verified? Yes   Any new allergies since your discharge? No   Dietary orders reviewed? Yes  Do you have support at home? Yes   Functional Questionnaire: (I = Independent and D = Dependent) ADLs: I  Bathing/Dressing- I  Meal Prep- I  Eating- I  Maintaining continence- I  Transferring/Ambulation- I  Managing Meds- I  Follow up appointments reviewed:   PCP Hospital f/u appt confirmed? Yes  Scheduled to see Dr. Damita Dunnings on 02/23/2020 @ 12:30 am.  [facility] f/u appt confirmed? Yes  Scheduled to see neurology   Are transportation arrangements needed? No   If their condition worsens, is the pt aware to call PCP or go to the Emergency Dept.? Yes  Was the patient provided with contact information for the PCP's office or ED? Yes  Was to pt encouraged to call back with questions or concerns? Yes

## 2020-02-23 ENCOUNTER — Other Ambulatory Visit: Payer: Medicare HMO

## 2020-02-23 ENCOUNTER — Encounter: Payer: Self-pay | Admitting: Family Medicine

## 2020-02-23 ENCOUNTER — Other Ambulatory Visit: Payer: Self-pay

## 2020-02-23 ENCOUNTER — Ambulatory Visit (INDEPENDENT_AMBULATORY_CARE_PROVIDER_SITE_OTHER): Payer: Medicare HMO | Admitting: Family Medicine

## 2020-02-23 VITALS — BP 132/84 | HR 62 | Temp 96.8°F | Ht 60.0 in | Wt 170.0 lb

## 2020-02-23 DIAGNOSIS — S8001XA Contusion of right knee, initial encounter: Secondary | ICD-10-CM | POA: Diagnosis not present

## 2020-02-23 DIAGNOSIS — Z8673 Personal history of transient ischemic attack (TIA), and cerebral infarction without residual deficits: Secondary | ICD-10-CM

## 2020-02-23 DIAGNOSIS — M1711 Unilateral primary osteoarthritis, right knee: Secondary | ICD-10-CM | POA: Diagnosis not present

## 2020-02-23 LAB — COMPREHENSIVE METABOLIC PANEL
ALT: 19 U/L (ref 0–35)
AST: 23 U/L (ref 0–37)
Albumin: 4.4 g/dL (ref 3.5–5.2)
Alkaline Phosphatase: 113 U/L (ref 39–117)
BUN: 12 mg/dL (ref 6–23)
CO2: 29 mEq/L (ref 19–32)
Calcium: 10.3 mg/dL (ref 8.4–10.5)
Chloride: 103 mEq/L (ref 96–112)
Creatinine, Ser: 0.91 mg/dL (ref 0.40–1.20)
GFR: 59.88 mL/min — ABNORMAL LOW (ref >60.00)
Glucose, Bld: 100 mg/dL — ABNORMAL HIGH (ref 70–99)
Potassium: 4.6 mEq/L (ref 3.5–5.1)
Sodium: 140 mEq/L (ref 135–145)
Total Bilirubin: 0.7 mg/dL (ref 0.2–1.2)
Total Protein: 6.6 g/dL (ref 6.0–8.3)

## 2020-02-23 LAB — CBC WITH DIFFERENTIAL/PLATELET
Basophils Absolute: 0.1 10*3/uL (ref 0.0–0.1)
Basophils Relative: 1.1 % (ref 0.0–3.0)
Eosinophils Absolute: 0.3 10*3/uL (ref 0.0–0.7)
Eosinophils Relative: 3.6 % (ref 0.0–5.0)
HCT: 43 % (ref 36.0–46.0)
Hemoglobin: 14.3 g/dL (ref 12.0–15.0)
Lymphocytes Relative: 29.2 % (ref 12.0–46.0)
Lymphs Abs: 2.2 10*3/uL (ref 0.7–4.0)
MCHC: 33.2 g/dL (ref 30.0–36.0)
MCV: 92.3 fl (ref 78.0–100.0)
Monocytes Absolute: 0.6 10*3/uL (ref 0.1–1.0)
Monocytes Relative: 8.3 % (ref 3.0–12.0)
Neutro Abs: 4.4 10*3/uL (ref 1.4–7.7)
Neutrophils Relative %: 57.8 % (ref 43.0–77.0)
Platelets: 300 10*3/uL (ref 150.0–400.0)
RBC: 4.66 Mil/uL (ref 3.87–5.11)
RDW: 13.5 % (ref 11.5–15.5)
WBC: 7.6 10*3/uL (ref 4.0–10.5)

## 2020-02-23 LAB — PHOSPHORUS: Phosphorus: 3.6 mg/dL (ref 2.3–4.6)

## 2020-02-23 NOTE — Patient Instructions (Signed)
Go to the lab on the way out.   If you have mychart we'll likely use that to update you.    Don't change your meds for now.  Let me see about neurology follow up.   Take care.  Glad to see you.

## 2020-02-23 NOTE — Progress Notes (Signed)
This visit occurred during the SARS-CoV-2 public health emergency.  Safety protocols were in place, including screening questions prior to the visit, additional usage of staff PPE, and extensive cleaning of exam room while observing appropriate contact time as indicated for disinfecting solutions.  She had CVA, fell onto R foot/leg.  Inpatient course discussed with patient.  She is currently treated with Plavix.  We talked about follow-up testing regarding Plavix.  I'll defer plavix testing to neurology.  She agreed.  We talked about getting extra help at night.  She is considering options.  She has a fall button.  She wanted to f/u with Leotis Pain, MD with neuro. I will check on this for patient.  We talked about stroke pathophysiology intervention at this point going forward with a combination of aspirin and Plavix.  She is on a statin currently.  No residual symptoms except for residual injury to R leg with ortho f/u today.   Bruising on dorsum R foot.  Foot pain is better.  Using walker.    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 Moderate joint effusion.Negative for Fracture."  Meds, vitals, and allergies reviewed.   ROS: Per HPI unless specifically indicated in ROS section   GEN: nad, alert and oriented HEENT: ncat NECK: supple w/o LA CV: rrr.  PULM: ctab, no inc wob ABD: soft, +bs EXT: no edema SKIN: no acute rash She has some bruising on the dorsum of the right foot but is able to bear weight.  At least 30 minutes were devoted to patient care in this encounter (this can potentially include time spent reviewing the patient's file/history, interviewing and examining the patient, counseling/reviewing plan with patient, ordering referrals, ordering tests, reviewing relevant laboratory or x-ray data, and documenting the encounter).

## 2020-02-24 LAB — MAGNESIUM: Magnesium: 2.2 mg/dL (ref 1.5–2.5)

## 2020-02-27 NOTE — Assessment & Plan Note (Signed)
Her main issues now are orthopedic concerns related to the fall and she has orthopedic follow-up pending.  We talked about aspirin and Plavix, with combination treatment at this point.  I will defer testing regarding efficacy of Plavix to the neurology clinic.  She wanted to follow with Dr. Leotis Pain with neurology and I will check on this.  She saw him as an inpatient.  She has no new symptoms otherwise and I would continue statin as is for now.  She agrees with plan.  We talked about getting extra help at home.  She has a fall button.  She is going to talk about her ongoing care needs with her family and update me as needed.  See notes on follow-up labs.

## 2020-02-29 DIAGNOSIS — F419 Anxiety disorder, unspecified: Secondary | ICD-10-CM | POA: Diagnosis not present

## 2020-02-29 DIAGNOSIS — M1711 Unilateral primary osteoarthritis, right knee: Secondary | ICD-10-CM | POA: Diagnosis not present

## 2020-02-29 DIAGNOSIS — M25571 Pain in right ankle and joints of right foot: Secondary | ICD-10-CM | POA: Diagnosis not present

## 2020-02-29 DIAGNOSIS — K589 Irritable bowel syndrome without diarrhea: Secondary | ICD-10-CM | POA: Diagnosis not present

## 2020-02-29 DIAGNOSIS — I10 Essential (primary) hypertension: Secondary | ICD-10-CM | POA: Diagnosis not present

## 2020-02-29 DIAGNOSIS — I69351 Hemiplegia and hemiparesis following cerebral infarction affecting right dominant side: Secondary | ICD-10-CM | POA: Diagnosis not present

## 2020-02-29 DIAGNOSIS — G8911 Acute pain due to trauma: Secondary | ICD-10-CM | POA: Diagnosis not present

## 2020-02-29 DIAGNOSIS — F329 Major depressive disorder, single episode, unspecified: Secondary | ICD-10-CM | POA: Diagnosis not present

## 2020-02-29 DIAGNOSIS — K760 Fatty (change of) liver, not elsewhere classified: Secondary | ICD-10-CM | POA: Diagnosis not present

## 2020-03-01 ENCOUNTER — Encounter: Payer: Self-pay | Admitting: Family Medicine

## 2020-03-01 ENCOUNTER — Ambulatory Visit: Payer: Medicare HMO | Admitting: Adult Health

## 2020-03-01 ENCOUNTER — Other Ambulatory Visit: Payer: Self-pay

## 2020-03-01 ENCOUNTER — Encounter: Payer: Self-pay | Admitting: Adult Health

## 2020-03-01 VITALS — BP 112/69 | HR 59 | Ht 60.0 in | Wt 169.0 lb

## 2020-03-01 DIAGNOSIS — Z8673 Personal history of transient ischemic attack (TIA), and cerebral infarction without residual deficits: Secondary | ICD-10-CM

## 2020-03-01 DIAGNOSIS — I639 Cerebral infarction, unspecified: Secondary | ICD-10-CM | POA: Diagnosis not present

## 2020-03-01 DIAGNOSIS — E785 Hyperlipidemia, unspecified: Secondary | ICD-10-CM | POA: Diagnosis not present

## 2020-03-01 DIAGNOSIS — I10 Essential (primary) hypertension: Secondary | ICD-10-CM | POA: Diagnosis not present

## 2020-03-01 DIAGNOSIS — G4733 Obstructive sleep apnea (adult) (pediatric): Secondary | ICD-10-CM

## 2020-03-01 MED ORDER — CLOPIDOGREL BISULFATE 75 MG PO TABS
75.0000 mg | ORAL_TABLET | Freq: Every day | ORAL | 3 refills | Status: DC
Start: 2020-03-01 — End: 2020-04-23

## 2020-03-01 NOTE — Patient Instructions (Signed)
Referral will be placed to Dr. Rayann Heman to further discuss placement of loop recorder to assess for possible atrial fibrillation as your stroke etiology  Continue aspirin 81 mg daily and clopidogrel 75 mg daily  and atovastatin 80mg  daily  for secondary stroke prevention  Continue aspirin for 1 additional week and then discontinue and remain on Plavix alone  Continue to follow up with PCP regarding cholesterol and blood pressure management  Maintain strict control of hypertension with blood pressure goal below 130/90 and cholesterol with LDL cholesterol (bad cholesterol) goal below 70 mg/dL.       Followup in the future with me in 3 months or call earlier if needed       Thank you for coming to see [place] at [facility]. I hope we have been able to provide you high quality care today.  You may receive a patient satisfaction survey over the next few weeks. We would appreciate your feedback and comments so that we may continue to improve ourselves and the health of our patients.

## 2020-03-01 NOTE — Progress Notes (Signed)
[facility] [address] [place]. [place] [address] [phone]       HOSPITAL FOLLOW UP NOTE  [name] Date of Birth:  15-May-1941 Medical Record Number:  [id number]   Reason for Referral:  hospital stroke follow up    SUBJECTIVE:   CHIEF COMPLAINT:  Chief Complaint  Patient presents with  . Follow-up    stroke fu, room 9, with daughter  . Hospitalization Follow-up    Recent stroke evaluated at [place] [facility]    HPI:   Today, 03/01/2020, [name] [name] returns for sooner scheduled visit per PCP request due to recent stroke accompanied by her daughter.  She presented to [facility] emergency room on 02/16/2020 with right-sided weakness contributing to fall.  MRI showed left frontal lobe infarcts likely from stenosis and left M2 all within the same vascular territory and was not felt to be embolic.  MRA head showed progression of stenosis within the distal A2/proximal artery which is now severe with additional intracranial atherosclerotic stenosis without interval change including severe focal stenosis within the cavernous left ICA, moderate focal stenosis within the cavernous right ICA, moderate stenosis with A2 right ACA and moderate stenosis within the proximal P2 left PCA.  2D echo showed EF of 60 to 65%.  On aspirin PTA and recommended DAPT " but long-term she may need Effient instead of Plavix or Brilinta".  Recommended to check Plavix assays outpatient to ensure adequate response.  HTN stable.  LDL 54 recommended continuation of atorvastatin 80 mg daily.  She did have right knee pain secondary to fall and recommended follow-up with orthopedics outpatient.  Evaluated by therapy initially recommended [facility] PT/OT but improved prior to discharge with [facility] therapy no longer indicated.  She was discharged home in stable condition and advised to follow-up with PCP, neurology and orthopedics.    She has been stable since returning home without new or  reoccurring stroke/TIA symptoms.  Denies residual right-sided symptoms but does continue to have chronic right knee pain and ankle pain post fall which has been limiting her ambulation.  She is currently working with [facility] PT and ambulating with rolling walker due to right leg pain.  Continues to have mild dysarthria and decreased LUE dexterity which has been stable after recent stroke and improvement since prior visit.  She has continued on aspirin and Plavix without bleeding or bruising.  Remains on atorvastatin 80 mg daily without myalgias.  Blood pressure today satisfactory at 112/69.  She reports intermittent use of CPAP for sleep apnea management with difficulty tolerating due to increased pressure.  Reports undergoing sleep study over 10 years ago and has not had any repeat study or follow-up to ensure adequate management of apnea.  Multiple questions regarding recent stroke and etiology as well as recommended Plavix lab work Nurse, children's) as recommended by [employer] neurologist.  Further reviewed recent admission and imaging with Dr. Leonie Man due to concern of stroke locations and possible embolic etiology. Per Dr. Leonie Man, he does not believe recent stroke was due to large vessel disease but more so embolic secondary to unknown source as her recent imaging showed infarcts within different vascular locations including ACA and MCA.  Also reviewed imaging from prior stroke in 06/2019 and due to size, potentially embolic. He recommends further cardiac evaluation with placement of loop recorder to assess for possible atrial fibrillation as stroke etiology.  No indication for TEE. He recommends aspirin and Plavix for only 3-week duration and then Plavix alone.  No indication  for P2 Y 12 testing as she has no history or evidence of Plavix failure or concern.       History provided for reference purposes only Update 01/02/2020 JM: [name] [name] is being seen for stroke follow-up previously followed through [facility]  research participating in [place] stroke trial. Evaluated during stroke trial on 10/10/2019 recently completing [place] therapies with residual deficits of mild decrease left hand dexterity, imbalance and dysarthria therefore additional orders placed for outpatient therapies.  She does report ongoing improvement and continues to work with neuro rehab PT/OT/ST for residual dysarthria and dysphonia, gait impairment/imbalance and LUE incoordination. Evaluated in [place] by Dr. 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  Reports continued participation SLP with [facility] Reports imbalance greatest difficulty still with improvement but also underlying chronic lower back, bilateral hip and knee pain.  She wishes to proceed with additional injections and nerve blocks therefore withdrew from research study approximately 1 month ago.  Continues to follow with [facility] She remains on aspirin 81mg  daily without bleeding or bruising. Continues on atorvastatin 80 mg daily without myalgias.  Recent lipid panel 10/13/2019 showed LDL 64.  Blood pressure today 116/70.  No further concerns.  Stroke admission 07/11/2019 Personally reviewed recent hospitalization pertinent notes, labs and imaging [name] [name] is a 79 y.o. female with history of HTN, HLD, obesity who presented on 07/11/2019 with dysarthria, L facial droop, mild L hemiparesis as well as hypertensive urgency.  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  Recommended DAPT for 3 weeks and aspirin alone.  BP upon arrival 202/110 stabilized during admission and recommended long-term BP goal normotensive range.  LDL 130 and initiate atorvastatin 80 mg daily. Other stroke risk factors include advanced age, EtOH use, OSA on CPAP and family reported hx imaging with evidence of small AAA.  Evaluated by therapy and recommend discharge to CIR for ongoing therapy needs but  unfortunately did not by insurance therefore discharged home with home health therapy.  Also advised to follow-up with [facility] research is interested in [employer] stroke trial.  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   MRI  R corona radiata and caudate head small vessel disease. infarcts  CTA head & neck negative LVO, negative arthrosclerosis or stenosis in neck, positive for intracranial arthrosclerosis with significant stenosis within moderate to severe left ICA anterior genu, moderate bilateral ICA distally to and moderate left PCA P1  2D Echo  EF 50 to 55%  LDL 130  HgbA1c 5.5  Lovenox 40 mg sq daily for VTE prophylaxis  No antithrombotic prior to admission, now on clopidogrel 75 mg daily and aspirin 324 mg. Change aspirin to 81. Continue DAPT x 3 weeks then aspirin alone.   Therapy recommendations: CLR -insurance denied therefore recommended HH therapy  Disposition: Home  Recommend aspirin and Plavix for 3 weeks followed by aspirin alone. She is  participating in the [employer] stroke prevention trial( standard of care antiplatelets and Factor xi inhibitor versus standard of care antiplates and placebo)         ROS:   14 system review of systems performed and negative with exception of see HPI  PMH:  Past Medical History:  Diagnosis Date  . Arthritis    knees  . Depression   . Fatty liver   . GERD (gastroesophageal reflux disease)   .  Hyperlipidemia   . Hypertension   . IBS (irritable bowel syndrome)   . Obesity   . Pneumonia   . Sleep apnea    uses CPAP    PSH:  Past Surgical History:  Procedure Laterality Date  . BLADDER SURGERY    . BREAST SURGERY     breast biopsy  . BROW LIFT Bilateral 04/14/2017   Procedure: BLEPHAROPLASTY UPPER EYELID WITH EXCESS SKIN;  Surgeon: Karle Starch, MD;  Location: [place];  Service: Ophthalmology;  Laterality: Bilateral;  . CATARACT  EXTRACTION W/PHACO Left 02/05/2016   Procedure: CATARACT EXTRACTION PHACO AND INTRAOCULAR LENS PLACEMENT (IOC);  Surgeon: Birder Robson, MD;  Location: [facility];  Service: Ophthalmology;  Laterality: Left;  [place] 01:10 AP% 22.3 CDE 15.67 Fluid pack lot # [id number]  . CATARACT EXTRACTION W/PHACO Right 02/26/2016   Procedure: CATARACT EXTRACTION PHACO AND INTRAOCULAR LENS PLACEMENT (IOC);  Surgeon: Birder Robson, MD;  Location: [facility];  Service: Ophthalmology;  Laterality: Right;  [place] 57.4 AP% 24.0 CDE 13.75 Fluid Pack lot # [id number]  . CHOLECYSTECTOMY    . COLONOSCOPY WITH PROPOFOL N/A 12/18/2014   Procedure: COLONOSCOPY WITH PROPOFOL;  Surgeon: Manya Silvas, MD;  Location[facility] END[facility];  Service: Endoscopy;  Laterality: N/A;  . DEEP NECK LYMPH NODE BIOPSY / EXCISION    . JOINT REPLACEMENT    . KNEE ARTHROPLASTY Left 06/20/2015   Procedure: COMPUTER ASSISTED TOTAL KNEE ARTHROPLASTY;  Surgeon: Dereck Leep, MD;  Location: [facility];  Service: Orthopedics;  Laterality: Left;  . PTOSIS REPAIR Bilateral 04/14/2017   Procedure: PTOSIS REPAIR RESECT EX;  Surgeon: Karle Starch, MD;  Location: [place];  Service: Ophthalmology;  Laterality: Bilateral;  sleep apnea  . TONSILLECTOMY    . TOTAL HIP ARTHROPLASTY Right 07/21/2018   Procedure: TOTAL HIP ARTHROPLASTY ANTERIOR APPROACH;  Surgeon: Gaynelle Arabian, MD;  Location: [facility];  Service: Orthopedics;  Laterality: Right;    Social History:  Social History   Socioeconomic History  . Marital status: Widowed    Spouse name: Not on file  . Number of children: Not on file  . Years of education: Not on file  . Highest education level: Not on file  Occupational History  . Not on file  Tobacco Use  . Smoking status: Never Smoker  . Smokeless tobacco: Never Used  Vaping Use  . Vaping Use: Never used  Substance and Sexual Activity  . Alcohol use: Yes    Comment: ocassional glass of wine 2-3 times a year  . Drug use: No  .  Sexual activity: Never  Other Topics Concern  . 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 Difficulty of Paying Living Expenses: Not on file  Food Insecurity:   . Worried About Charity fundraiser in the Last Year: Not on file  . Ran Out of Food in the Last Year: Not on file  Transportation Needs:   . Lack of Transportation (Medical): Not on file  . Lack of Transportation (Non-Medical): Not on file  Physical Activity:   . Days of Exercise per Week: Not on file  . Minutes of Exercise per Session: Not on file  Stress:   . Feeling of Stress : Not on  file  Social Connections:   . Frequency of Communication with Friends and Family: Not on file  . Frequency of Social Gatherings with Friends and Family: Not on file  . Attends Religious Services: Not on file  . Active Member of Clubs or Organizations: Not on file  . Attends Archivist Meetings: Not on file  . Marital Status: Not on file  Intimate Partner Violence:   . Fear of Current or Ex-Partner: Not on file  . Emotionally Abused: Not on file  . Physically Abused: Not on file  . Sexually Abused: Not on file    Family History:  Family History  Problem Relation Age of Onset  . Heart attack Mother   . Dementia Father   . Aortic aneurysm Brother   . Colon cancer Neg Hx   . Breast cancer Neg Hx     Medications:   Current Outpatient Medications on File Prior to Visit  Medication Sig Dispense Refill  . acetaminophen (TYLENOL) 500 MG tablet Take 500 mg by mouth every 6 (six) hours as needed for moderate pain.     [name] Kitchen aspirin EC 81 MG tablet Take 81 mg by mouth daily.    [name] Kitchen atenolol (TENORMIN) 50 MG tablet TAKE ONE TABLET TWICE DAILY 180 tablet 3  . atorvastatin (LIPITOR) 80 MG tablet TAKE ONE TABLET BY MOUTH EVERY DAY AT 6PM 30  tablet 5  . Biotin 10000 MCG TABS Take 5,000 mcg by mouth daily.     . chlorpheniramine (CHLOR-TRIMETON) 4 MG tablet Take 4 mg by mouth daily as needed for allergies.    . cholecalciferol (VITAMIN D) 1000 UNITS tablet Take 1,000 Units by mouth daily.     . clopidogrel (PLAVIX) 75 MG tablet Take 1 tablet (75 mg total) by mouth daily. Have Neurologist Refill 30 tablet 0  . fluticasone (FLONASE) 50 MCG/ACT nasal spray Place 2 sprays into both nostrils daily as needed for allergies or rhinitis.    . hydrOXYzine (ATARAX/VISTARIL) 10 MG tablet Take 0.5-1 tablets (5-10 mg total) by mouth at bedtime as needed (for insomnia). 30 tablet 2  . magnesium oxide (MAG-OX) 400 MG tablet Take 400 mg by mouth daily.    [name] Kitchen senna-docusate (SENOKOT-S) 8.6-50 MG tablet Take 1 tablet by mouth at bedtime as needed for mild constipation. 30 tablet 0  . sertraline (ZOLOFT) 25 MG tablet TAKE ONE TABLET BY MOUTH EVERY DAY 90 tablet 1   No current facility-administered medications on file prior to visit.    Allergies:   Allergies  Allergen Reactions  . Hydrocodone Nausea Only    Noted after surgery, may be able to tolerate with food      OBJECTIVE:  Physical Exam  Vitals:   03/01/20 0749  BP: 112/69  Pulse: (!) 59  Weight: 169 lb (76.7 kg)  Height: 5' (1.524 m)   Body mass index is 33.01 kg/m. No exam data present  General: well developed, well nourished, pleasant elderly Caucasian female, seated, in no evident distress Neck: supple with no carotid or supraclavicular bruits Cardiovascular: regular rate and rhythm, no murmurs Vascular:  Normal pulses all extremities Musculoskeletal: Limited ROM R ankle with ankle brace intact and moderate ecchymosis   Neurologic Exam Mental Status: Awake and fully alert. Mild dysarthria. Oriented to place and time. Recent and remote memory intact. Attention span, concentration and fund of knowledge appropriate. Mood and affect appropriate.  Cranial Nerves: Fundoscopic  exam reveals sharp disc margins. Pupils equal, briskly reactive to light. Extraocular movements full  without nystagmus. Visual fields full to confrontation. Hearing intact. Facial sensation intact.  Tongue and palate moves normally and symmetrically.  Mild left lower facial weakness Motor: Normal bulk and tone. Normal strength in all tested extremity muscles except slight decrease left hand fine motor weakness. Sensory.: intact to touch , pinprick , position and vibratory sensation.  Coordination: Rapid alternating movements normal in all extremities except decreased left hand dexterity. Finger-to-nose and heel-to-shin performed accurately bilaterally. Orbits right arm over left arm. Gait and Station: Deferred as rolling walker not present at visit Reflexes: 1+ and symmetric. Toes downgoing.        ASSESSMENT/PLAN: [name] [name] is a 79 y.o. 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 Leonie Man with concerns of embolic etiology secondary to unknown source.  Vascular risk factors include HTN, HLD, intracranial stenosis with stenosis, OSA on CPAP, EtOH use and obesity.        1. L frontal lobe strokes:  a. Likely embolic secondary to unknown source therefore referral placed to Dr. Rayann Heman cardiology for placement of loop recorder to assess for possible atrial fibrillation as stroke etiology b. Remain on aspirin and Plavix for additional 1 week for total of 3-week duration and then Plavix alone c. Continue atorvastatin 80 mg daily for secondary stroke prevention d. Discussed secondary stroke prevention measures and importance of close PCP follow-up for aggressive stroke  risk factor management 2. Hx R MCA stroke :  a. Residual deficit: Dysarthria and LUE decreased dexterity.  Improvement since prior visit b. Initially felt to be secondary to small vessel disease but on further imaging review, concern for possible embolic etiology secondary to unknown source due to infarct size - see #1a 3. HTN:  a. BP goal <130/90.  Stable today b. On atenolol managed by PCP 4. HLD: a.  LDL goal <70. Recent LDL 64. b. On atorvastatin 80 mg daily per PCP 5. OSA on CPAP:  a. Referral placed to [facility] for possible repeat sleep study in setting of recent multiple strokes, weight gain since prior study, difficulty tolerating CPAP, increased fatigue, insomnia and nocturia b. She does question possible use of inspire device due to difficulty tolerating CPAP which can be further discussed with [facility] sleep provider 6. R ankle and knee pain: a. Continue to follow with orthopedics and ongoing participation with PT    Follow-up as scheduled on 07/04/2020   I spent a prolonged 60 minutes of face-to-face and non-face-to-face time with patient and daughter.  This included previsit chart review including recent hospitalization at [facility] including pertinent progress notes, lab work and imaging review and further discussion with Dr. Leonie Man, lab review, study review, order entry, electronic health record documentation, patient education regarding recent stroke and prior stroke, residual deficits, importance of managing stroke risk factors and answered all questions to patient satisfaction   Frann Rider, [facility]  [facility] [address] [place], [place] [address]  Phone [phone] Fax [phone] Note: This document was prepared with digital dictation and possible smart phrase technology. Any transcriptional errors that result from this process are unintentional.

## 2020-03-01 NOTE — Progress Notes (Signed)
I agree with the above plan 

## 2020-03-06 DIAGNOSIS — Z96641 Presence of right artificial hip joint: Secondary | ICD-10-CM

## 2020-03-06 DIAGNOSIS — I69351 Hemiplegia and hemiparesis following cerebral infarction affecting right dominant side: Secondary | ICD-10-CM | POA: Diagnosis not present

## 2020-03-06 DIAGNOSIS — E785 Hyperlipidemia, unspecified: Secondary | ICD-10-CM

## 2020-03-06 DIAGNOSIS — K589 Irritable bowel syndrome without diarrhea: Secondary | ICD-10-CM

## 2020-03-06 DIAGNOSIS — Z7982 Long term (current) use of aspirin: Secondary | ICD-10-CM

## 2020-03-06 DIAGNOSIS — F419 Anxiety disorder, unspecified: Secondary | ICD-10-CM

## 2020-03-06 DIAGNOSIS — I10 Essential (primary) hypertension: Secondary | ICD-10-CM | POA: Diagnosis not present

## 2020-03-06 DIAGNOSIS — Z8701 Personal history of pneumonia (recurrent): Secondary | ICD-10-CM

## 2020-03-06 DIAGNOSIS — Z7902 Long term (current) use of antithrombotics/antiplatelets: Secondary | ICD-10-CM

## 2020-03-06 DIAGNOSIS — E669 Obesity, unspecified: Secondary | ICD-10-CM

## 2020-03-06 DIAGNOSIS — G8911 Acute pain due to trauma: Secondary | ICD-10-CM | POA: Diagnosis not present

## 2020-03-06 DIAGNOSIS — K219 Gastro-esophageal reflux disease without esophagitis: Secondary | ICD-10-CM

## 2020-03-06 DIAGNOSIS — M1711 Unilateral primary osteoarthritis, right knee: Secondary | ICD-10-CM

## 2020-03-06 DIAGNOSIS — K760 Fatty (change of) liver, not elsewhere classified: Secondary | ICD-10-CM

## 2020-03-06 DIAGNOSIS — M25571 Pain in right ankle and joints of right foot: Secondary | ICD-10-CM | POA: Diagnosis not present

## 2020-03-06 DIAGNOSIS — Z9181 History of falling: Secondary | ICD-10-CM

## 2020-03-06 DIAGNOSIS — G4733 Obstructive sleep apnea (adult) (pediatric): Secondary | ICD-10-CM

## 2020-03-06 DIAGNOSIS — Z7951 Long term (current) use of inhaled steroids: Secondary | ICD-10-CM

## 2020-03-06 DIAGNOSIS — Z96652 Presence of left artificial knee joint: Secondary | ICD-10-CM

## 2020-03-06 DIAGNOSIS — F329 Major depressive disorder, single episode, unspecified: Secondary | ICD-10-CM

## 2020-03-07 ENCOUNTER — Encounter: Payer: Self-pay | Admitting: Adult Health

## 2020-03-07 DIAGNOSIS — K760 Fatty (change of) liver, not elsewhere classified: Secondary | ICD-10-CM | POA: Diagnosis not present

## 2020-03-07 DIAGNOSIS — I69351 Hemiplegia and hemiparesis following cerebral infarction affecting right dominant side: Secondary | ICD-10-CM | POA: Diagnosis not present

## 2020-03-07 DIAGNOSIS — F329 Major depressive disorder, single episode, unspecified: Secondary | ICD-10-CM | POA: Diagnosis not present

## 2020-03-07 DIAGNOSIS — M1711 Unilateral primary osteoarthritis, right knee: Secondary | ICD-10-CM | POA: Diagnosis not present

## 2020-03-07 DIAGNOSIS — F419 Anxiety disorder, unspecified: Secondary | ICD-10-CM | POA: Diagnosis not present

## 2020-03-07 DIAGNOSIS — K589 Irritable bowel syndrome without diarrhea: Secondary | ICD-10-CM | POA: Diagnosis not present

## 2020-03-07 DIAGNOSIS — M25571 Pain in right ankle and joints of right foot: Secondary | ICD-10-CM | POA: Diagnosis not present

## 2020-03-07 DIAGNOSIS — I10 Essential (primary) hypertension: Secondary | ICD-10-CM | POA: Diagnosis not present

## 2020-03-07 DIAGNOSIS — G8911 Acute pain due to trauma: Secondary | ICD-10-CM | POA: Diagnosis not present

## 2020-03-08 ENCOUNTER — Telehealth: Payer: Self-pay | Admitting: Neurology

## 2020-03-08 ENCOUNTER — Institutional Professional Consult (permissible substitution): Payer: Medicare HMO | Admitting: Neurology

## 2020-03-08 NOTE — Telephone Encounter (Signed)
Pt arrived 5 mins past her appt time. Was told we would need to reschedule. Pt declined to reschedule and sts she will call back to do so.

## 2020-03-08 NOTE — Telephone Encounter (Signed)
Pt called, in a bump up in [place], daughter on her way to pick me up to bring me to my appt. I Deneise Lever) informed Pt If you arrive late after time scheduled. Will be at the descretion of the provider whether you will be seen or not. Patient stated, my daughter should be in time.

## 2020-03-12 ENCOUNTER — Other Ambulatory Visit: Payer: Self-pay | Admitting: Family Medicine

## 2020-03-12 NOTE — Telephone Encounter (Signed)
Sent. Thanks.   

## 2020-03-12 NOTE — Telephone Encounter (Signed)
Received a refill request for:  Hydroxyzine 10 mg LR 08/30/19, # 30, 2 rf's LOV 02/23/20 FOV none scheduled.    Please review and advise.  Thanks.   Dm/cma

## 2020-03-13 ENCOUNTER — Telehealth: Payer: Self-pay

## 2020-03-13 ENCOUNTER — Encounter: Payer: Self-pay | Admitting: Neurology

## 2020-03-13 ENCOUNTER — Other Ambulatory Visit: Payer: Self-pay

## 2020-03-13 ENCOUNTER — Ambulatory Visit: Payer: Medicare HMO | Admitting: Neurology

## 2020-03-13 VITALS — BP 122/78 | HR 63 | Ht 60.0 in | Wt 169.0 lb

## 2020-03-13 DIAGNOSIS — I639 Cerebral infarction, unspecified: Secondary | ICD-10-CM | POA: Diagnosis not present

## 2020-03-13 DIAGNOSIS — E669 Obesity, unspecified: Secondary | ICD-10-CM

## 2020-03-13 DIAGNOSIS — R351 Nocturia: Secondary | ICD-10-CM

## 2020-03-13 DIAGNOSIS — Z8673 Personal history of transient ischemic attack (TIA), and cerebral infarction without residual deficits: Secondary | ICD-10-CM | POA: Diagnosis not present

## 2020-03-13 DIAGNOSIS — Z9989 Dependence on other enabling machines and devices: Secondary | ICD-10-CM | POA: Diagnosis not present

## 2020-03-13 DIAGNOSIS — G4733 Obstructive sleep apnea (adult) (pediatric): Secondary | ICD-10-CM | POA: Diagnosis not present

## 2020-03-13 NOTE — Telephone Encounter (Signed)
Pt is scheduled today at 1pm at [facility] Neurological sleep study office and pt wants to know when she had her last sleep study and started using a cpap machine. Pt is not sure of a date and is not sure where had sleep study. Per media and chart review there is a notation [facility] sleep med on 11/16/2008. Pt will also ck with [employer] who has always given pt her cpap supplies. Nothing further needed at this time.

## 2020-03-13 NOTE — Patient Instructions (Signed)
  Here is what we discussed today and what we came up with as our plan for you:    Based on your symptoms and your exam I believe you are still at risk for obstructive sleep apnea and would benefit from reevaluation as it has been many years and you need new supplies and an updated machine. Therefore, I think we should proceed with a sleep study to determine how severe your sleep apnea is.  Please remember, the risks and ramifications of moderate to severe obstructive sleep apnea or OSA are: Cardiovascular disease, including congestive heart failure, stroke, difficult to control hypertension, arrhythmias, and even type 2 diabetes has been linked to untreated OSA. Sleep apnea causes disruption of sleep and sleep deprivation in most cases, which, in turn, can cause recurrent headaches, problems with memory, mood, concentration, focus, and vigilance. Most people with untreated sleep apnea report excessive daytime sleepiness, which can affect their ability to drive. Please do not drive if you feel sleepy.   I will likely see you back after your sleep study to go over the test results and where to go from there. We will call you after your sleep study to advise about the results (most likely, you will hear from [place], my nurse) and to set up an appointment at the time, as necessary.    Our sleep lab administrative assistant will call you to schedule your sleep study. If you don't hear back from her by about 2 weeks from now, please feel free to call her at [phone]. You can leave a message with your phone number and concerns, if you get the voicemail box. She will call back as soon as possible.

## 2020-03-13 NOTE — Progress Notes (Signed)
Subjective:    Patient ID: [name] [name] is a 79 y.o. female.  HPI     Star Age, [name] [name], [name] [name] [facility] [address], [address] [place], [place] [address]  Dear Janett Billow and Mamie Nick,   I saw your patient, [name] [name], upon your kind request, in my Sleep clinic today for initial consultation of her sleep disorder, in particular, evaluation of her prior diagnosis of sleep apnea.  The patient is accompanied by her daughter today. As you know, [name] [name] is a 79 year old right-handed woman with an underlying medical history of stroke, irritable bowel syndrome, hypertension, hyperlipidemia, reflux disease, depression, arthritis, and obesity, who was previously diagnosed with obstructive sleep apnea and placed on CPAP therapy.  I reviewed office note from 03/01/2020.  She sustained a recent stroke in the left frontal area in September 2021.  She had a sleep study several years ago.  Prior sleep study results are not available for my review today.  Her Epworth sleepiness score is 9 out of 24, fatigue severity score is 20 out of 63.  She has a CPAP machine, set up date from what we could see in online [facility] records was 06/15/2014.  She has [employer] as her DME company.  I was able to review her compliance data from 02/12/2020 through 03/12/2020, which is a total of 30 days, during which time she used her machine 25 days with percent use days greater than 4 hours at 80%, indicating very good compliance with an average usage of 6 hours and 36 minutes, residual AHI at goal at 2.2/h, 95th percentile of pressure at 11.2 cm with a range of 9 to 19 cm with EPR of 3.  Leak on the high side with a 95th percentile at 32 L/min.  She reports overall doing well with her current machine which is nearly 77 years old.  This is her second CPAP machine.  Sleep study testing was years ago, she recalls going to [facility] in [place]. She has an appointment pending with Dr. Rayann Heman to  discuss loop monitor placement. She had fallen last month and twisted her ankle.  She has seen her orthopedic surgeon, Dr. Maureen Ralphs for right ankle pain and ongoing arthritis affecting her right knee.  She may need at some point a right knee replacement.  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  She has nocturia about 2-3 times per average night.  She lives alone, is widowed, has twin sons and 1 daughter.  She has a call alert button and also Alexa in 2 places at her house.  She drinks caffeine in the form of coffee, 1 cup/day, no alcohol, non-smoker.  She goes to bed between 10 and 11 and likes to listen to [place].  She has no pets in the household.  Her Past Medical History Is Significant For: Past Medical History:  Diagnosis Date  . Arthritis    knees  . Depression   . Fatty liver   . GERD (gastroesophageal reflux disease)   . Hyperlipidemia   . Hypertension   . IBS (irritable bowel syndrome)   . Obesity   . Pneumonia   . Sleep apnea    uses CPAP    Her Past Surgical History Is Significant For: Past Surgical History:  Procedure Laterality Date  . BLADDER SURGERY    . BREAST SURGERY     breast biopsy  . BROW LIFT Bilateral 04/14/2017   Procedure: BLEPHAROPLASTY  UPPER EYELID WITH EXCESS SKIN;  Surgeon: Karle Starch, [name] [name];  Location: [place];  Service: Ophthalmology;  Laterality: Bilateral;  . CATARACT EXTRACTION W/PHACO Left 02/05/2016   Procedure: CATARACT EXTRACTION PHACO AND INTRAOCULAR LENS PLACEMENT (IOC);  Surgeon: Birder Robson, [name] [name];  Location: [facility];  Service: Ophthalmology;  Laterality: Left;  [place] 01:10 AP% 22.3 CDE 15.67 Fluid pack lot # [id number]  . CATARACT EXTRACTION W/PHACO Right 02/26/2016   Procedure: CATARACT EXTRACTION PHACO AND INTRAOCULAR LENS PLACEMENT (IOC);  Surgeon: Birder Robson, [name] [name];  Location: [facility];  Service: Ophthalmology;  Laterality: Right;  [place] 57.4 AP% 24.0 CDE 13.75 Fluid Pack lot #  [id number]  . CHOLECYSTECTOMY    . COLONOSCOPY WITH PROPOFOL N/A 12/18/2014   Procedure: COLONOSCOPY WITH PROPOFOL;  Surgeon: Manya Silvas, [name] [name];  Location[facility];  Service: Endoscopy;  Laterality: N/A;  . DEEP NECK LYMPH NODE BIOPSY / EXCISION    . JOINT REPLACEMENT    . KNEE ARTHROPLASTY Left 06/20/2015   Procedure: COMPUTER ASSISTED TOTAL KNEE ARTHROPLASTY;  Surgeon: Dereck Leep, [name] [name];  Location: [facility];  Service: Orthopedics;  Laterality: Left;  . PTOSIS REPAIR Bilateral 04/14/2017   Procedure: PTOSIS REPAIR RESECT EX;  Surgeon: Karle Starch, [name] [name];  Location: [place];  Service: Ophthalmology;  Laterality: Bilateral;  sleep apnea  . TONSILLECTOMY    . TOTAL HIP ARTHROPLASTY Right 07/21/2018   Procedure: TOTAL HIP ARTHROPLASTY ANTERIOR APPROACH;  Surgeon: Gaynelle Arabian, [name] [name];  Location: [facility];  Service: Orthopedics;  Laterality: Right;    Her Family History Is Significant For: Family History  Problem Relation [name] of Onset  . Heart attack Mother   . Dementia Father   . Aortic aneurysm Brother   . Colon cancer Neg Hx   . Breast cancer Neg Hx     Her Social History Is Significant For: Social History   Socioeconomic History  . Marital status: Widowed    Spouse name: Not on file  . Number of children: Not on file  . Years of education: Not on file  . Highest education level: Not on file  Occupational History  . Not on file  Tobacco Use  . Smoking status: Never Smoker  . Smokeless tobacco: Never Used  Vaping Use  . Vaping Use: Never used  Substance and Sexual Activity  . Alcohol use: Yes    Comment: ocassional glass of wine 2-3 times a year  . Drug use: No  . Sexual activity: Never  Other Topics Concern  . 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  Difficulty of Paying Living Expenses: Not on file  Food Insecurity:   . Worried About Charity fundraiser in the Last Year: Not on file  . Ran Out of Food in the Last Year: Not on file  Transportation Needs:   . Lack of Transportation (Medical): Not on file  . Lack of Transportation (Non-Medical): Not on file  Physical Activity:   . Days of Exercise per Week: Not on file  . Minutes of Exercise per Session: Not on file  Stress:   . Feeling of Stress : Not on file  Social Connections:   . Frequency of Communication with Friends and Family: Not on file  .  Frequency of Social Gatherings with Friends and Family: Not on file  . Attends Religious Services: Not on file  . Active Member of Clubs or Organizations: Not on file  . Attends Archivist Meetings: Not on file  . Marital Status: Not on file    Her Allergies Are:  Allergies  Allergen Reactions  . Hydrocodone Nausea Only    Noted after surgery, may be able to tolerate with food  :   Her Current Medications Are:  Outpatient Encounter Medications as of 03/13/2020  Medication Sig  . acetaminophen (TYLENOL) 500 MG tablet Take 500 mg by mouth every 6 (six) hours as needed for moderate pain.   [name] Kitchen atenolol (TENORMIN) 50 MG tablet TAKE ONE TABLET TWICE DAILY  . atorvastatin (LIPITOR) 80 MG tablet TAKE ONE TABLET BY MOUTH EVERY DAY AT 6PM  . Biotin 10000 MCG TABS Take 5,000 mcg by mouth daily.   . chlorpheniramine (CHLOR-TRIMETON) 4 MG tablet Take 4 mg by mouth daily as needed for allergies.  . cholecalciferol (VITAMIN D) 1000 UNITS tablet Take 1,000 Units by mouth daily.   . clopidogrel (PLAVIX) 75 MG tablet Take 1 tablet (75 mg total) by mouth daily.  . fluticasone (FLONASE) 50 MCG/ACT nasal spray Place 2 sprays into both nostrils daily as needed for allergies or rhinitis.  . hydrOXYzine (ATARAX/VISTARIL) 10 MG tablet TAKE 1/2-1 TABLET BY MOUTH 3 TIMES DAILYAS NEEDED FOR ANXIETY (SEDATION CAUTION)  . magnesium oxide (MAG-OX)  400 MG tablet Take 500 mg by mouth daily.   [name] Kitchen senna-docusate (SENOKOT-S) 8.6-50 MG tablet Take 1 tablet by mouth at bedtime as needed for mild constipation.  . [DISCONTINUED] aspirin EC 81 MG tablet Take 81 mg by mouth daily. (Patient not taking: Reported on 03/13/2020)  . [DISCONTINUED] sertraline (ZOLOFT) 25 MG tablet TAKE ONE TABLET BY MOUTH EVERY DAY   No facility-administered encounter medications on file as of 03/13/2020.  :  Review of Systems:  Out of a complete 14 point review of systems, all are reviewed and negative with the exception of these symptoms as listed below: Review of Systems  Neurological:       Here for sleep consult. Prior sleep study and pt is currently on CPAP. Reports her current machine is between 60-28 years old. [employer] Romans is current DME  Epworth Sleepiness Scale 0= would never doze 1= slight chance of dozing 2= moderate chance of dozing 3= high chance of dozing  Sitting and reading:2 Watching TV:2 Sitting inactive in a public place (ex. Theater or meeting):1 As a passenger in a car for an hour without a break:0 Lying down to rest in the afternoon:3 Sitting and talking to someone:0 Sitting quietly after lunch (no alcohol):1 In a car, while stopped in traffic:0 Total:9     Objective:  Neurological Exam  Physical Exam Physical Examination:   Vitals:   03/13/20 1343  BP: 122/78  Pulse: 63  SpO2: 98%    General Examination: The patient is a very pleasant 79 y.o. female in no acute distress. She appears well-developed and well-nourished and well groomed.   HEENT: Normocephalic, atraumatic, pupils are equal, round and reactive to light, extraocular tracking is good without limitation to gaze excursion or nystagmus noted. Hearing is grossly intact. Face is symmetric with normal facial animation. Speech is clear with no dysarthria noted. There is no hypophonia. There is no lip, neck/head, jaw or voice tremor. Neck is supple with full range of passive and  active motion. There are no carotid bruits on  auscultation. Oropharynx exam reveals: mild mouth dryness, adequate dental hygiene and moderate airway crowding, due to redundant soft palate, Mallampati class III, slightly wider uvula, tonsils absent.  Tongue protrudes centrally and palate elevates symmetrically.  Neck circumference is 15-1/4 inches.  Chest: Clear to auscultation without wheezing, rhonchi or crackles noted.  Heart: S1+S2+0, regular and normal without murmurs, rubs or gallops noted.   Abdomen: Soft, non-tender and non-distended with normal bowel sounds appreciated on auscultation.  Extremities: There is trace pitting edema in the right ankle area, she wears a Velcro brace.   Skin: Warm and dry without trophic changes noted.   Musculoskeletal: exam reveals pain in the right knee.  She also reports discomfort in the right ankle.   Neurologically:  Mental status: The patient is awake, alert and oriented in all 4 spheres. Her immediate and remote memory, attention, language skills and fund of knowledge are appropriate. There is no evidence of aphasia, agnosia, apraxia or anomia. Speech is clear with normal prosody and enunciation. Thought process is linear. Mood is normal and affect is normal.  Cranial nerves II - XII are as described above under HEENT exam.  Motor exam: Normal bulk, strength and tone is noted. There is no tremor, Romberg is negative. Reflexes are 2+ throughout. Fine motor skills and coordination: grossly intact.  Cerebellar testing: No dysmetria or intention tremor. There is no truncal or gait ataxia.  Sensory exam: intact to light touch in the upper and lower extremities.  Gait, station and balance: She stands with mild difficulty and pushes herself up.  She has a rolling walker and has a mild limp on the right.   Assessment and Plan:  In summary, [name] [name] is a very pleasant 79 y.o.-year old female with an underlying medical history of stroke, irritable bowel  syndrome, hypertension, hyperlipidemia, reflux disease, depression, arthritis, and obesity, who presents for evaluation of her obstructive sleep apnea.  She was diagnosed several years ago and is currently using her second machine.  She is compliant with treatment, she had a lapse in her treatment in September because of hospitalization.  She has adequate apnea scores but has not had reevaluation in several years and would be eligible for a new machine also.  She is agreeable to pursuing testing and I recommended a home sleep test for reestablishing her sleep apnea diagnosis.  She is advised to continue with her current AutoPap machine and is commended for her treatment adherence.  We talked about the diagnosis of OSA, its prognosis and treatment options. We talked about medical treatments, surgical interventions and non-pharmacological approaches. I explained in particular the risks and ramifications of untreated moderate to severe OSA, especially with respect to developing cardiovascular disease down the Road, including congestive heart failure, difficult to treat hypertension, cardiac arrhythmias, or stroke. Even type 2 diabetes has, in part, been linked to untreated OSA. Symptoms of untreated OSA include daytime sleepiness, memory problems, mood irritability and mood disorder such as depression and anxiety, lack of energy, as well as recurrent headaches, especially morning headaches. We talked about the importance of secondary stroke prevention and fall prevention.  We will call her to set up her home sleep test.  She will likely have an insurance change towards the end of this month and will update as after she has a chance to talk to her insurance agent.  I explained the importance of being compliant with PAP treatment, not only for insurance purposes but primarily to improve Her symptoms, and for the  patient's long term health benefit, including to reduce Her cardiovascular risks. I answered all their  questions today and the patient and her daughter were in agreement. I plan to see her back after the sleep study is completed and encouraged them to call with any interim questions, concerns, problems or updates.   Thank you very much for allowing me to participate in the care of this nice patient. If I can be of any further assistance to you please do not hesitate to talk to me.    Sincerely,   Star Age, [name] [name], [name] [name]

## 2020-03-14 DIAGNOSIS — K589 Irritable bowel syndrome without diarrhea: Secondary | ICD-10-CM | POA: Diagnosis not present

## 2020-03-14 DIAGNOSIS — M25571 Pain in right ankle and joints of right foot: Secondary | ICD-10-CM | POA: Diagnosis not present

## 2020-03-14 DIAGNOSIS — I69351 Hemiplegia and hemiparesis following cerebral infarction affecting right dominant side: Secondary | ICD-10-CM | POA: Diagnosis not present

## 2020-03-14 DIAGNOSIS — F329 Major depressive disorder, single episode, unspecified: Secondary | ICD-10-CM | POA: Diagnosis not present

## 2020-03-14 DIAGNOSIS — F419 Anxiety disorder, unspecified: Secondary | ICD-10-CM | POA: Diagnosis not present

## 2020-03-14 DIAGNOSIS — I10 Essential (primary) hypertension: Secondary | ICD-10-CM | POA: Diagnosis not present

## 2020-03-14 DIAGNOSIS — K760 Fatty (change of) liver, not elsewhere classified: Secondary | ICD-10-CM | POA: Diagnosis not present

## 2020-03-14 DIAGNOSIS — G8911 Acute pain due to trauma: Secondary | ICD-10-CM | POA: Diagnosis not present

## 2020-03-14 DIAGNOSIS — M1711 Unilateral primary osteoarthritis, right knee: Secondary | ICD-10-CM | POA: Diagnosis not present

## 2020-03-15 ENCOUNTER — Telehealth: Payer: Self-pay

## 2020-03-15 NOTE — Telephone Encounter (Signed)
LVM for pt to call me back to schedule sleep study  

## 2020-03-19 ENCOUNTER — Telehealth: Payer: Self-pay | Admitting: Family Medicine

## 2020-03-19 DIAGNOSIS — I10 Essential (primary) hypertension: Secondary | ICD-10-CM | POA: Diagnosis not present

## 2020-03-19 DIAGNOSIS — M25571 Pain in right ankle and joints of right foot: Secondary | ICD-10-CM | POA: Diagnosis not present

## 2020-03-19 DIAGNOSIS — G8911 Acute pain due to trauma: Secondary | ICD-10-CM | POA: Diagnosis not present

## 2020-03-19 DIAGNOSIS — M1711 Unilateral primary osteoarthritis, right knee: Secondary | ICD-10-CM | POA: Diagnosis not present

## 2020-03-19 DIAGNOSIS — F329 Major depressive disorder, single episode, unspecified: Secondary | ICD-10-CM | POA: Diagnosis not present

## 2020-03-19 DIAGNOSIS — K589 Irritable bowel syndrome without diarrhea: Secondary | ICD-10-CM | POA: Diagnosis not present

## 2020-03-19 DIAGNOSIS — F419 Anxiety disorder, unspecified: Secondary | ICD-10-CM | POA: Diagnosis not present

## 2020-03-19 DIAGNOSIS — K760 Fatty (change of) liver, not elsewhere classified: Secondary | ICD-10-CM | POA: Diagnosis not present

## 2020-03-19 DIAGNOSIS — I69351 Hemiplegia and hemiparesis following cerebral infarction affecting right dominant side: Secondary | ICD-10-CM | POA: Diagnosis not present

## 2020-03-19 DIAGNOSIS — J302 Other seasonal allergic rhinitis: Secondary | ICD-10-CM

## 2020-03-19 NOTE — Telephone Encounter (Signed)
Patient called.  Patient said her allergies have bothering her for over a year.  Patient wants to know if Dr.Letvak can recommend an allergist for her see. Patient can go to [place] or [place].  Patient can go anytime.

## 2020-03-20 NOTE — Telephone Encounter (Signed)
I put in the referral.  Thanks.  

## 2020-03-20 NOTE — Telephone Encounter (Signed)
Patient advised.

## 2020-03-26 ENCOUNTER — Ambulatory Visit: Payer: Medicare HMO | Admitting: Internal Medicine

## 2020-03-26 ENCOUNTER — Encounter: Payer: Self-pay | Admitting: *Deleted

## 2020-03-26 ENCOUNTER — Other Ambulatory Visit: Payer: Self-pay

## 2020-03-26 ENCOUNTER — Encounter: Payer: Self-pay | Admitting: Internal Medicine

## 2020-03-26 VITALS — BP 132/90 | HR 65 | Ht 60.0 in | Wt 169.2 lb

## 2020-03-26 DIAGNOSIS — G4733 Obstructive sleep apnea (adult) (pediatric): Secondary | ICD-10-CM

## 2020-03-26 DIAGNOSIS — I639 Cerebral infarction, unspecified: Secondary | ICD-10-CM

## 2020-03-26 NOTE — Progress Notes (Signed)
Electrophysiology Office Note   Date:  03/26/2020   ID:  [name], [name] March 30, 1941, MRN [id number]  PCP:  Tonia Ghent, MD    Primary Electrophysiologist: Thompson Grayer, MD    CC: stroke   History of Present Illness: [name] [name] is a 79 y.o. female who presents today for electrophysiology evaluation.   She is referred by Frann Rider and Dr Leonie Man for EP consultation regarding possible arrhythmogenic causes for stroke. The patient presented to [facility] 02/16/20 with R sided weakness and was found to have L frontal lobe infarcts on MRI as well as L M2 lesion.  These were felt to be due to cerebrovascular disease and the patient was placed on plavix.  The patient was subsequently evaluated by Dr Leonie Man and MRI was reviewed.  He did not feel that recent strokes were due to large vessel disease and felt that an embolic cause was more likely. Dr Leonie Man has referred the patient to EP for monitoring to evaluate for an arrhythmogenic cause.  Today, she denies symptoms of palpitations, chest pain, shortness of breath, orthopnea, PND, lower extremity edema, claudication, dizziness, presyncope, syncope, bleeding, or neurologic sequela. The patient is tolerating medications without difficulties and is otherwise without complaint today.    Past Medical History:  Diagnosis Date  . Arthritis    knees  . Depression   . Fatty liver   . GERD (gastroesophageal reflux disease)   . Hyperlipidemia   . Hypertension   . IBS (irritable bowel syndrome)   . Obesity   . Pneumonia   . Sleep apnea    uses CPAP   Past Surgical History:  Procedure Laterality Date  . BLADDER SURGERY    . BREAST SURGERY     breast biopsy  . BROW LIFT Bilateral 04/14/2017   Procedure: BLEPHAROPLASTY UPPER EYELID WITH EXCESS SKIN;  Surgeon: Karle Starch, MD;  Location: [place];  Service: Ophthalmology;  Laterality: Bilateral;  . CATARACT EXTRACTION W/PHACO Left 02/05/2016   Procedure: CATARACT EXTRACTION  PHACO AND INTRAOCULAR LENS PLACEMENT (IOC);  Surgeon: Birder Robson, MD;  Location: [facility];  Service: Ophthalmology;  Laterality: Left;  [place] 01:10 AP% 22.3 CDE 15.67 Fluid pack lot # [id number]  . CATARACT EXTRACTION W/PHACO Right 02/26/2016   Procedure: CATARACT EXTRACTION PHACO AND INTRAOCULAR LENS PLACEMENT (IOC);  Surgeon: Birder Robson, MD;  Location: [facility];  Service: Ophthalmology;  Laterality: Right;  [place] 57.4 AP% 24.0 CDE 13.75 Fluid Pack lot # [id number]  . CHOLECYSTECTOMY    . COLONOSCOPY WITH PROPOFOL N/A 12/18/2014   Procedure: COLONOSCOPY WITH PROPOFOL;  Surgeon: Manya Silvas, MD;  Location: [facility];  Service: Endoscopy;  Laterality: N/A;  . DEEP NECK LYMPH NODE BIOPSY / EXCISION    . JOINT REPLACEMENT    . KNEE ARTHROPLASTY Left 06/20/2015   Procedure: COMPUTER ASSISTED TOTAL KNEE ARTHROPLASTY;  Surgeon: Dereck Leep, MD;  Location: [facility];  Service: Orthopedics;  Laterality: Left;  . PTOSIS REPAIR Bilateral 04/14/2017   Procedure: PTOSIS REPAIR RESECT EX;  Surgeon: Karle Starch, MD;  Location: [place];  Service: Ophthalmology;  Laterality: Bilateral;  sleep apnea  . TONSILLECTOMY    . TOTAL HIP ARTHROPLASTY Right 07/21/2018   Procedure: TOTAL HIP ARTHROPLASTY ANTERIOR APPROACH;  Surgeon: Gaynelle Arabian, MD;  Location: [facility];  Service: Orthopedics;  Laterality: Right;     Current Outpatient Medications  Medication Sig Dispense Refill  . acetaminophen (TYLENOL) 500 MG tablet Take 500 mg by mouth every  6 (six) hours as needed for moderate pain.     [name] Kitchen atenolol (TENORMIN) 50 MG tablet TAKE ONE TABLET TWICE DAILY 180 tablet 3  . atorvastatin (LIPITOR) 80 MG tablet TAKE ONE TABLET BY MOUTH EVERY DAY AT 6PM 30 tablet 5  . Biotin 10000 MCG TABS Take 5,000 mcg by mouth daily.     . chlorpheniramine (CHLOR-TRIMETON) 4 MG tablet Take 4 mg by mouth daily as needed for allergies.    . cholecalciferol (VITAMIN D) 1000 UNITS tablet Take 1,000 Units by mouth  daily.     . clopidogrel (PLAVIX) 75 MG tablet Take 1 tablet (75 mg total) by mouth daily. 90 tablet 3  . fluticasone (FLONASE) 50 MCG/ACT nasal spray Place 2 sprays into both nostrils daily as needed for allergies or rhinitis.    . hydrOXYzine (ATARAX/VISTARIL) 10 MG tablet TAKE 1/2-1 TABLET BY MOUTH 3 TIMES DAILYAS NEEDED FOR ANXIETY (SEDATION CAUTION) 30 tablet 2  . magnesium oxide (MAG-OX) 400 MG tablet Take 500 mg by mouth daily.     [name] Kitchen senna-docusate (SENOKOT-S) 8.6-50 MG tablet Take 1 tablet by mouth at bedtime as needed for mild constipation. 30 tablet 0   No current facility-administered medications for this visit.    Allergies:   Hydrocodone   Social History:  The patient  reports that she has never smoked. She has never used smokeless tobacco. She reports current alcohol use. She reports that she does not use drugs.   Family History:  The patient's  family history includes Aortic aneurysm in her brother; Dementia in her father; Heart attack in her mother.    ROS:  Please see the history of present illness.   All other systems are personally reviewed and negative.    PHYSICAL EXAM: VS:  BP 132/90   Pulse 65   Ht 5' (1.524 m)   Wt 169 lb 3.2 oz (76.7 kg)   SpO2 98%   BMI 33.04 kg/m  , BMI Body mass index is 33.04 kg/m. GEN: Well nourished, well developed, in no acute distress HEENT: normal Neck: no JVD, carotid bruits, or masses Cardiac: RRR; no murmurs, rubs, or gallops,no edema  Respiratory:  clear to auscultation bilaterally, normal work of breathing GI: soft, nontender, nondistended, + BS MS: no deformity or atrophy Skin: warm and dry  Neuro:  Strength and sensation are intact Psych: euthymic mood, full affect  EKG:  EKG is ordered today. The ekg ordered today is personally reviewed and shows sinus rhythm   Recent Labs: 02/23/2020: ALT 19; BUN 12; Creatinine, Ser 0.91; Hemoglobin 14.3; Magnesium 2.2; Platelets 300.0; Potassium 4.6; Sodium 140  personally  reviewed   Lipid Panel     Component Value Date/Time   CHOL 105 02/17/2020 0426   CHOL 189 05/31/2015 1136   TRIG 91 02/17/2020 0426   HDL 33 (L) 02/17/2020 0426   HDL 38 (L) 05/31/2015 1136   CHOLHDL 3.2 02/17/2020 0426   VLDL 18 02/17/2020 0426   LDLCALC 54 02/17/2020 0426   LDLCALC 108 (H) 05/31/2015 1136   LDLDIRECT 160.0 12/10/2018 0947   personally reviewed   Wt Readings from Last 3 Encounters:  03/26/20 169 lb 3.2 oz (76.7 kg)  03/13/20 169 lb (76.7 kg)  03/01/20 169 lb (76.7 kg)      Other studies personally reviewed: Additional studies/ records that were reviewed today include: stroke center notes, prior ekgs, echo Review of the above records today demonstrates: as above  Assessment and Plan:  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  I spoke at length with the patient about monitoring for afib with either a 30 day event monitor or an implantable loop recorder.  Risks, benefits, and alteratives to implantable loop recorder were discussed with the patient today.  Her insurance requires 30 day monitoring prior to approval of ILR.    We will therefore place 30 day monitor and then have her return to further discuss ILR depending on its findings.   2. Morbid obesity Body mass index is 33.04 kg/m. Lifestyle modification advised  3. OSA She is compliant with CPAP   4. HL She is on lipitor  Follow-up:  6-8 weeks with me  Current medicines are reviewed at length with the patient today.   The patient does not have concerns regarding her medicines.  The following changes were made today:  none  Labs/ tests ordered today include:  Orders Placed This Encounter  Procedures  . EKG 12-Lead     Signed, Thompson Grayer, MD  03/26/2020 10:23 AM     [facility] [address] [place] [address] [phone] (office) [phone] (fax)

## 2020-03-26 NOTE — Patient Instructions (Addendum)
Medication Instructions:  Your physician recommends that you continue on your current medications as directed. Please refer to the Current Medication list given to you today.  *If you need a refill on your cardiac medications before your next appointment, please call your pharmacy*  Lab Work: None ordered.  If you have labs (blood work) drawn today and your tests are completely normal, you will receive your results only by: [name] Kitchen MyChart Message (if you have MyChart) OR . A paper copy in the mail If you have any lab test that is abnormal or we need to change your treatment, we will call you to review the results.  Testing/Procedures: Your physician has recommended that you wear an event monitor. Event monitors are medical devices that record the heart's electrical activity. Doctors most often [place] these monitors to diagnose arrhythmias. Arrhythmias are problems with the speed or rhythm of the heartbeat. The monitor is a small, portable device. You can wear one while you do your normal daily activities. This is usually used to diagnose what is causing palpitations/syncope (passing out).   Follow-Up: At [facility], you and your health needs are our priority.  As part of our continuing mission to provide you with exceptional heart care, we have created designated Provider Care Teams.  These Care Teams include your primary Cardiologist (physician) and Advanced Practice Providers (APPs -  Physician Assistants and Nurse Practitioners) who all work together to provide you with the care you need, when you need it.  We recommend signing up for the patient portal called "MyChart".  Sign up information is provided on this After Visit Summary.  MyChart is used to connect with patients for Virtual Visits (Telemedicine).  Patients are able to view lab/test results, encounter notes, upcoming appointments, etc.  Non-urgent messages can be sent to your provider as well.   To learn more about what you can do with  MyChart, go to [email or web address].    Your next appointment:   Your physician wants you to follow-up in: 06/06/20 at 9:45 am     Other Instructions: [employer] Cardiac Event Monitor Instructions Your physician has requested you wear your cardiac event monitor for ___30__ days, (1-30). [employer] may call or text to confirm a shipping address. The monitor will be sent to a land address via UPS. [employer] will not ship a monitor to a PO BOX. It typically takes 3-5 days to receive your monitor after it has been enrolled. [employer] will assist with USPS tracking if your package is delayed. The telephone number for [employer] is [phone]. Once you have received your monitor, please review the enclosed instructions. Instruction tutorials can also be viewed under help and settings on the enclosed cell phone. Your monitor has already been registered assigning a specific monitor serial # to you.  Applying the monitor Remove cell phone from case and turn it on. The cell phone works as Dealer and needs to be within [employer] of you at all times. The cell phone will need to be charged on a daily basis. We recommend you plug the cell phone into the enclosed charger at your bedside table every night.  Monitor batteries: You will receive two monitor batteries labelled #1 and #2. These are your recorders. Plug battery #2 onto the second connection on the enclosed charger. Keep one battery on the charger at all times. This will keep the monitor battery deactivated. It will also keep it fully charged for when you need to switch your monitor batteries. A small light  will be blinking on the battery emblem when it is charging. The light on the battery emblem will remain on when the battery is fully charged.  Open package of a Monitor strip. Insert battery #1 into black hood on strip and gently squeeze monitor battery onto connection as indicated in instruction booklet. Set aside while  preparing skin.  Choose location for your strip, vertical or horizontal, as indicated in the instruction booklet. Shave to remove all hair from location. There cannot be any lotions, oils, powders, or colognes on skin where monitor is to be applied. Wipe skin clean with enclosed Saline wipe. Dry skin completely.  Peel paper labeled #1 off the back of the Monitor strip exposing the adhesive. Place the monitor on the chest in the vertical or horizontal position shown in the instruction booklet. One arrow on the monitor strip must be pointing upward. Carefully remove paper labeled #2, attaching remainder of strip to your skin. Try not to create any folds or wrinkles in the strip as you apply it.  Firmly press and release the circle in the center of the monitor battery. You will hear a small beep. This is turning the monitor battery on. The heart emblem on the monitor battery will light up every 5 seconds if the monitor battery in turned on and connected to the patient securely. Do not push and hold the circle down as this turns the monitor battery off. The cell phone will locate the monitor battery. A screen will appear on the cell phone checking the connection of your monitor strip. This may read poor connection initially but change to good connection within the next minute. Once your monitor accepts the connection you will hear a series of 3 beeps followed by a climbing crescendo of beeps. A screen will appear on the cell phone showing the two monitor strip placement options. Touch the picture that demonstrates where you applied the monitor strip.  Your monitor strip and battery are waterproof. You are able to shower, bathe, or swim with the monitor on. They just ask you do not submerge deeper than 3 feet underwater. We recommend removing the monitor if you are swimming in a lake, river, or ocean.  Your monitor battery will need to be switched to a fully charged monitor battery approximately  once a week. The cell phone will alert you of an action which needs to be made.  On the cell phone, tap for details to reveal connection status, monitor battery status, and cell phone battery status. The green dots indicates your monitor is in good status. A red dot indicates there is something that needs your attention.  To record a symptom, click the circle on the monitor battery. In 30-60 seconds a list of symptoms will appear on the cell phone. Select your symptom and tap save. Your monitor will record a sustained or significant arrhythmia regardless of you clicking the button. Some patients do not feel the heart rhythm irregularities. [employer] will notify us of any serious or critical events.  Refer to instruction booklet for instructions on switching batteries, changing strips, the Do not disturb or Pause features, or any additional questions.  Call [employer] at [phone], to confirm your monitor is transmitting and record your baseline. They will answer any questions you may have regarding the monitor instructions at that time.  Returning the monitor to [facility] all equipment back into blue box. Peel off strip of paper to expose adhesive and close box securely. There is a prepaid UPS  shipping label on this box. Drop in a UPS drop box, or at a UPS facility like [employer]. You may also contact [employer] to arrange UPS to pick up monitor package at your home.

## 2020-03-26 NOTE — Progress Notes (Signed)
Patient ID: [name] [name], female   DOB: May 06, 1941, 79 y.o.   MRN: [id number] Patient enrolled for [employer] to ship a 30 day Cardiac event monitor to her home.

## 2020-03-28 ENCOUNTER — Ambulatory Visit (INDEPENDENT_AMBULATORY_CARE_PROVIDER_SITE_OTHER): Payer: Medicare HMO | Admitting: Allergy & Immunology

## 2020-03-28 ENCOUNTER — Encounter: Payer: Self-pay | Admitting: Allergy & Immunology

## 2020-03-28 ENCOUNTER — Other Ambulatory Visit: Payer: Self-pay

## 2020-03-28 VITALS — BP 132/86 | HR 64 | Temp 98.2°F | Resp 16 | Ht 60.0 in | Wt 169.2 lb

## 2020-03-28 DIAGNOSIS — J3089 Other allergic rhinitis: Secondary | ICD-10-CM

## 2020-03-28 DIAGNOSIS — J302 Other seasonal allergic rhinitis: Secondary | ICD-10-CM | POA: Diagnosis not present

## 2020-03-28 MED ORDER — IPRATROPIUM BROMIDE 0.06 % NA SOLN: NASAL | 5 refills | Status: DC

## 2020-03-28 MED ORDER — MONTELUKAST SODIUM 10 MG PO TABS: 10.0000 mg | ORAL_TABLET | Freq: Every day | ORAL | 5 refills | Status: DC

## 2020-03-28 NOTE — Progress Notes (Signed)
NEW PATIENT  Date of Service/Encounter:  03/28/20  Referring provider: Tonia Ghent, MD   Assessment:   Seasonal and perennial allergic rhinitis (grasses, weeds, trees, indoor molds, cat and cockroach)  Plan/Recommendations:   1. Chronic rhinitis - Testing today showed: grasses, weeds, trees, indoor molds, cat and cockroach - Copy of test results provided.  - Avoidance measures provided. - Stop taking: Flonase - Continue with: Allegra (fexofenadine)  - Start taking: Singulair (montelukast) 10mg  daily and nasal ipratropium one spray per nostril twice daily (up to three times daily) to help with runny nose - You can use an extra dose of the antihistamine, if needed, for breakthrough symptoms.  - Consider nasal saline rinses 1-2 times daily to remove allergens from the nasal cavities as well as help with mucous clearance (this is especially helpful to do before the nasal sprays are given) - Consider allergy shots as a means of long-term control. - Allergy shots "re-train" and "reset" the immune system to ignore environmental allergens and decrease the resulting immune response to those allergens (sneezing, itchy watery eyes, runny nose, nasal congestion, etc).    - Allergy shots improve symptoms in 75-85% of patients.  - We can discuss more at the next appointment if the medications are not working for you.  2. Return in about 6 weeks (around 05/09/2020).    Subjective:   [name] [name] is a 79 y.o. female presenting today for evaluation of  Chief Complaint  Patient presents with  . Allergic Rhinitis     watery eyes, hoarsness, congestion, runny nose     [name] [name] has a history of the following: Patient Active Problem List   Diagnosis Date Noted  . Seasonal and perennial allergic rhinitis 03/28/2020  . HLD (hyperlipidemia) 02/16/2020  . Stroke ([place]) 02/16/2020  . Fall 02/16/2020  . Abdominal wall hernia 10/30/2019  . Change in voice 10/30/2019  . History of  stroke 09/14/2019  . Spinal stenosis 07/24/2019  . Mixed hyperlipidemia   . OSA (obstructive sleep apnea)   . Dysfunction of eustachian tube 05/15/2019  . Other social stressor 03/29/2019  . 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 Diarrhea 06/20/2018  . Joint pain 06/20/2018  . 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 Medicare annual wellness visit, subsequent 09/15/2017  . SUI (stress urinary incontinence, female) 08/07/2017  . Back pain 06/16/2016  . Advance care planning 01/07/2016  . Sleep apnea   . 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 Allergic rhinitis 11/24/2014  . Adult BMI 30+ 11/24/2014  . Depression 11/24/2014  . Insomnia 08/23/2009  . Headache, migraine 05/10/2009  . HERPES SIMPLEX INFECTION 08/14/2006  . HYPERCHOLESTEROLEMIA 08/14/2006  . Anxiety state 08/14/2006  . Essential hypertension 08/14/2006  . HIATAL HERNIA 08/14/2006  . IRRITABLE BOWEL SYNDROME 08/14/2006  . 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 Primary osteoarthritis of right knee 08/14/2006  . PLANTAR FASCIITIS 08/14/2006    History obtained from: chart review and patient.  [name] [name] was referred by Tonia Ghent, MD.     [name] [name] is a 79 y.o. female presenting for an evaluation of environmental allergies.   She reports that she has had seasonal allergies that have gotten worse. She reports that her symptoms got worse when she moved into a community for retired people (she moved into this home in October 2020). It is a stand alone house, but she does tell me that there is LVP throughout the home. There are apparently  a lot of spiders and spider webs throughout the home. She thinks that there might be more dust in this home than what she was used to previously. She thinks it might be a "different" type of dust. She did have allergies when she lived near [place], but it was not as bad.  She reports hoarseness in the morning and constant drainage  throughout the day. She uses Kleenex throughout the day. She was on cetirizine as needed, but she is now taking it throughout the day. Last year around this time of the year, she was given prednisone due to all of these issues. She took one round and then one month later she had rebound symptoms that flared up again. She has never been tested.   She has been suffering from strokes (one in February and last month). They were not major but she still recovering from that. She is on Plavix and she is being tested for various etiologies of her strokes. She is going to be wearing a heart monitor. There is a thought that this might be related to atrial fibrillation. She is seeing Dr. Willaim Rayas at [facility], where she has also been doing rehabilitation.   She denies any any other atopic conditions. She does have a diagnosis of psoriasis but she does not use anything in particular. She does use mometasone on her scalp as needed, but in general she does not use it routinely. She has joint issues but she has never seen a Rheumatologist. She has had a hip and knee replacement. She does need another knee and now she was diagnosed with DJD.    Otherwise, there is no history of other atopic diseases, including asthma, food allergies, drug allergies, stinging insect allergies, eczema, urticaria or contact dermatitis. There is no significant infectious history. Vaccinations are up to date.    Past Medical History: Patient Active Problem List   Diagnosis Date Noted  . Seasonal and perennial allergic rhinitis 03/28/2020  . HLD (hyperlipidemia) 02/16/2020  . Stroke ([place]) 02/16/2020  . Fall 02/16/2020  . Abdominal wall hernia 10/30/2019  . Change in voice 10/30/2019  . History of stroke 09/14/2019  . Spinal stenosis 07/24/2019  . Mixed hyperlipidemia   . OSA (obstructive sleep apnea)   . Dysfunction of eustachian tube 05/15/2019  . Other social stressor 03/29/2019  . 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 Diarrhea 06/20/2018  . Joint pain 06/20/2018  . 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 Medicare annual wellness visit, subsequent 09/15/2017  . SUI (stress urinary incontinence, female) 08/07/2017  . Back pain 06/16/2016  . Advance care planning 01/07/2016  . Sleep apnea   . 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 Allergic rhinitis 11/24/2014  . Adult BMI 30+ 11/24/2014  . Depression 11/24/2014  . Insomnia 08/23/2009  . Headache, migraine 05/10/2009  . HERPES SIMPLEX INFECTION 08/14/2006  . HYPERCHOLESTEROLEMIA 08/14/2006  . Anxiety state 08/14/2006  . Essential hypertension 08/14/2006  . HIATAL HERNIA 08/14/2006  . IRRITABLE BOWEL SYNDROME 08/14/2006  . 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 Primary osteoarthritis of right knee 08/14/2006  . PLANTAR FASCIITIS 08/14/2006    Medication List:  Allergies as of 03/28/2020      Reactions   Hydrocodone Nausea Only   Noted after surgery, may be able to tolerate with food      Medication List       Accurate as of March 28, 2020 10:49 AM. If you have any questions, ask  your nurse or doctor.        acetaminophen 500 MG tablet Commonly known as: TYLENOL Take 500 mg by mouth every 6 (six) hours as needed for moderate pain.   atenolol 50 MG tablet Commonly known as: TENORMIN TAKE ONE TABLET TWICE DAILY   atorvastatin 80 MG tablet Commonly known as: LIPITOR TAKE ONE TABLET BY MOUTH EVERY DAY AT 6PM   Biotin 10000 MCG Tabs Take 5,000 mcg by mouth daily.   chlorpheniramine 4 MG tablet Commonly known as: CHLOR-TRIMETON Take 4 mg by mouth daily as needed for allergies.   cholecalciferol 1000 units tablet Commonly known as: VITAMIN D Take 1,000 Units by mouth daily.   clopidogrel 75 MG tablet Commonly known as: PLAVIX Take 1 tablet (75 mg total) by mouth daily.   fluticasone 50 MCG/ACT nasal spray Commonly known as: FLONASE Place 2 sprays into both nostrils daily as  needed for allergies or rhinitis.   hydrOXYzine 10 MG tablet Commonly known as: ATARAX/VISTARIL TAKE 1/2-1 TABLET BY MOUTH 3 TIMES DAILYAS NEEDED FOR ANXIETY (SEDATION CAUTION)   magnesium oxide 400 MG tablet Commonly known as: MAG-OX Take 500 mg by mouth daily.   senna-docusate 8.6-50 MG tablet Commonly known as: Senokot-S Take 1 tablet by mouth at bedtime as needed for mild constipation.       Birth History: non-contributory  Developmental History: non-contributory  Past Surgical History: Past Surgical History:  Procedure Laterality Date  . BLADDER SURGERY    . BREAST SURGERY     breast biopsy  . BROW LIFT Bilateral 04/14/2017   Procedure: BLEPHAROPLASTY UPPER EYELID WITH EXCESS SKIN;  Surgeon: Karle Starch, MD;  Location: [place];  Service: Ophthalmology;  Laterality: Bilateral;  . CATARACT EXTRACTION W/PHACO Left 02/05/2016   Procedure: CATARACT EXTRACTION PHACO AND INTRAOCULAR LENS PLACEMENT (IOC);  Surgeon: Birder Robson, MD;  Location: [facility];  Service: Ophthalmology;  Laterality: Left;  [place] 01:10 AP% 22.3 CDE 15.67 Fluid pack lot # [id number]  . CATARACT EXTRACTION W/PHACO Right 02/26/2016   Procedure: CATARACT EXTRACTION PHACO AND INTRAOCULAR LENS PLACEMENT (IOC);  Surgeon: Birder Robson, MD;  Location: [facility];  Service: Ophthalmology;  Laterality: Right;  [place] 57.4 AP% 24.0 CDE 13.75 Fluid Pack lot # [id number]  . CHOLECYSTECTOMY    . COLONOSCOPY WITH PROPOFOL N/A 12/18/2014   Procedure: COLONOSCOPY WITH PROPOFOL;  Surgeon: Manya Silvas, MD;  Location: [facility];  Service: Endoscopy;  Laterality: N/A;  . DEEP NECK LYMPH NODE BIOPSY / EXCISION    . JOINT REPLACEMENT    . KNEE ARTHROPLASTY Left 06/20/2015   Procedure: COMPUTER ASSISTED TOTAL KNEE ARTHROPLASTY;  Surgeon: Dereck Leep, MD;  Location: [facility];  Service: Orthopedics;  Laterality: Left;  . PTOSIS REPAIR Bilateral 04/14/2017   Procedure: PTOSIS REPAIR RESECT EX;  Surgeon:  Karle Starch, MD;  Location: [place];  Service: Ophthalmology;  Laterality: Bilateral;  sleep apnea  . TONSILLECTOMY    . TOTAL HIP ARTHROPLASTY Right 07/21/2018   Procedure: TOTAL HIP ARTHROPLASTY ANTERIOR APPROACH;  Surgeon: Gaynelle Arabian, MD;  Location: [facility];  Service: Orthopedics;  Laterality: Right;     Family History: Family History  Problem Relation Age of Onset  . Heart attack Mother   . Dementia Father   . Aortic aneurysm Brother   . Colon cancer Neg Hx   . Breast cancer Neg Hx      Social History: Rosamae lives at home with herself. She is widowed since 2016.  She lives in  a house.  There is vinyl throughout the home.  She has gas heating and central cooling.  There are no animals inside or outside of the home.  There are no dust mite covers on the bedding.  There is no tobacco exposure.  He is currently retired.  She is not exposed to fumes, chemicals, or dust.  There is no HEPA filter.  She does not live near an interstate or industrial area.   Review of Systems  Constitutional: Negative.  Negative for fever, malaise/fatigue and weight loss.  HENT: Positive for congestion. Negative for ear discharge and ear pain.        Negative for sinus pain.  Eyes: Negative for pain, discharge and redness.  Respiratory: Negative for cough, sputum production, shortness of breath and wheezing.   Cardiovascular: Negative.  Negative for chest pain and palpitations.  Gastrointestinal: Negative for abdominal pain, constipation, diarrhea, heartburn, nausea and vomiting.  Skin: Negative.  Negative for itching and rash.  Neurological: Negative for dizziness and headaches.  Endo/Heme/Allergies: Negative for environmental allergies. Does not bruise/bleed easily.  All other systems reviewed and are negative.      Objective:   Blood pressure 132/86, pulse 64, temperature 98.2 F (36.8 C), temperature source Temporal, resp. rate 16, height 5' (1.524 m), weight 169 lb 3.2 oz (76.7  kg), SpO2 98 %. Body mass index is 33.04 kg/m.   Physical Exam:   Physical Exam Constitutional:      Appearance: She is well-developed.  HENT:     Head: Normocephalic and atraumatic.     Right Ear: Tympanic membrane, ear canal and external ear normal.     Left Ear: Tympanic membrane and ear canal normal.     Nose: No nasal deformity, septal deviation, mucosal edema or rhinorrhea.     Right Sinus: No maxillary sinus tenderness or frontal sinus tenderness.     Left Sinus: No maxillary sinus tenderness or frontal sinus tenderness.     Mouth/Throat:     Mouth: Mucous membranes are not pale and not dry.     Pharynx: Uvula midline.  Eyes:     General:        Right eye: No discharge.        Left eye: No discharge.     Conjunctiva/sclera: Conjunctivae normal.     Right eye: Right conjunctiva is not injected. No chemosis.    Left eye: Left conjunctiva is not injected. No chemosis.    Pupils: Pupils are equal, round, and reactive to light.  Cardiovascular:     Rate and Rhythm: Normal rate and regular rhythm.     Heart sounds: Normal heart sounds.  Pulmonary:     Effort: Pulmonary effort is normal. No tachypnea, accessory muscle usage or respiratory distress.     Breath sounds: Normal breath sounds. No wheezing, rhonchi or rales.  Chest:     Chest wall: No tenderness.  Lymphadenopathy:     Cervical: No cervical adenopathy.  Skin:    Capillary Refill: Capillary refill takes less than 2 seconds.     Coloration: Skin is not pale.     Findings: No abrasion, erythema, petechiae or rash. Rash is not papular, urticarial or vesicular.  Neurological:     General: No focal deficit present.     Mental Status: She is alert.      Diagnostic studies:     Allergy Studies:     Airborne Adult Perc - 03/28/20 0900    Time Antigen Placed 1224  Allergen Manufacturer Greer    Location Back    Number of Test 59    1. Control-Buffer 50% Glycerol Negative    2. Control-Histamine 1 mg/ml  2+    3. Albumin saline Negative    4. [place] Negative    5. [place] Negative    6. Johnson Negative    7. R[place] Blue Negative    8. Meadow Fescue Negative    9. Perennial Rye Negative    10. Sweet Vernal Negative    11. Timothy Negative    12. Cocklebur Negative    13. Burweed Marshelder Negative    14. Ragweed, short Negative    15. Ragweed, Giant Negative    16. Plantain,  English Negative    17. Lamb's Quarters Negative    18. Sheep Sorrell Negative    19. Rough Pigweed Negative    20. Marsh Elder, Rough Negative    21. Mugwort, Common Negative    22. Ash mix Negative    23. Birch mix Negative    24. Beech American Negative    25. Box, Elder Negative    26. Cedar, red Negative    27. Cottonwood, [place] Negative    28. Elm mix Negative    29. Hickory Negative    30. Maple mix Negative    31. Oak, [place] mix Negative    32. Pecan Pollen Negative    33. Pine mix Negative    34. Sycamore Eastern Negative    35. [place], Black Pollen Negative    36. Alternaria alternata Negative    37. Cladosporium Herbarum Negative    38. Aspergillus mix Negative    39. Penicillium mix Negative    40. Bipolaris sorokiniana (Helminthosporium) Negative    41. Drechslera spicifera (Curvularia) Negative    42. Mucor plumbeus Negative    43. Fusarium moniliforme Negative    44. Aureobasidium pullulans (pullulara) Negative    45. Rhizopus oryzae Negative    46. Botrytis cinera Negative    47. Epicoccum nigrum Negative    48. Phoma betae Negative    49. Candida Albicans Negative    50. Trichophyton mentagrophytes Negative    51. Mite, D Farinae  5,000 AU/ml Negative    52. Mite, D Pteronyssinus  5,000 AU/ml Negative    53. Cat Hair 10,000 BAU/ml Negative    54.  Dog Epithelia Negative    55. Mixed Feathers Negative    56. Horse Epithelia Negative    57. Cockroach, German Negative    58. Mouse Negative    59. Tobacco Leaf Negative          Intradermal - 03/28/20 0900    Time  Antigen Placed 6812    Allergen Manufacturer Lavella Hammock    Location Arm    Number of Test 15    Control Negative    [place] Negative    Johnson 1+    7 Grass Negative    Ragweed mix Negative    Weed mix 1+    Tree mix 1+    Mold 1 Negative    Mold 2 Negative    Mold 3 Negative    Mold 4 2+    Cat 2+    Dog Negative    Cockroach 1+    Mite mix Negative           Allergy testing results were read and interpreted by myself, documented by clinical staff.         Salvatore Marvel, MD [facility]

## 2020-03-28 NOTE — Patient Instructions (Signed)
1. Chronic rhinitis - Testing today showed: grasses, weeds, trees, indoor molds, cat and cockroach - Copy of test results provided.  - Avoidance measures provided. - Stop taking: Flonase - Continue with: Allegra (fexofenadine)  - Start taking: Singulair (montelukast) 10mg  daily and nasal ipratropium one spray per nostril twice daily (up to three times daily) to help with runny nose - You can use an extra dose of the antihistamine, if needed, for breakthrough symptoms.  - Consider nasal saline rinses 1-2 times daily to remove allergens from the nasal cavities as well as help with mucous clearance (this is especially helpful to do before the nasal sprays are given) - Consider allergy shots as a means of long-term control. - Allergy shots "re-train" and "reset" the immune system to ignore environmental allergens and decrease the resulting immune response to those allergens (sneezing, itchy watery eyes, runny nose, nasal congestion, etc).    - Allergy shots improve symptoms in 75-85% of patients.  - We can discuss more at the next appointment if the medications are not working for you.  2. Return in about 6 weeks (around 05/09/2020).    Please inform us of any Emergency Department visits, hospitalizations, or changes in symptoms. Call us before going to the ED for breathing or allergy symptoms since we might be able to fit you in for a sick visit. Feel free to contact us anytime with any questions, problems, or concerns.  It was a pleasure to meet you today!  Websites that have reliable patient information: 1. American Academy of Asthma, Allergy, and Immunology: www.aaaai.org 2. Food Allergy Research and Education (FARE): foodallergy.org 3. Mothers of Asthmatics: http://www.asthmacommunitynetwork.org 4. American College of Allergy, Asthma, and Immunology: www.acaai.org   COVID-19 Vaccine Information can be found at: [email or web address].[email or web address]  For questions related to vaccine distribution or appointments, please email [email or web address] or call [phone].     "Like" [place] on Facebook and Instagram for our latest updates!     HAPPY FALL!     Make sure you are registered to vote! If you have moved or changed any of your contact information, you will need to get this updated before voting!  In some cases, you MAY be able to register to vote online: [email or web address]    Reducing Pollen Exposure  The American Academy of Allergy, Asthma and Immunology suggests the following steps to reduce your exposure to pollen during allergy seasons.    1. Do not hang sheets or clothing out to dry; pollen may collect on these items. 2. Do not mow lawns or spend time around freshly cut grass; mowing stirs up pollen. 3. Keep windows closed at night.  Keep car windows closed while driving. 4. Minimize morning activities outdoors, a time when pollen counts are usually at their highest. 5. Stay indoors as much as possible when pollen counts or humidity is high and on windy days when pollen tends to remain in the air longer. 6. Use air conditioning when possible.  Many air conditioners have filters that trap the pollen spores. 7. Use a HEPA room air filter to remove pollen form the indoor air you breathe.  Control of Mold Allergen   Mold and fungi can grow on a variety of surfaces provided certain temperature and moisture conditions exist.  Outdoor molds grow on plants, decaying vegetation and soil.  The major outdoor mold, Alternaria and Cladosporium, are found in very high numbers during hot and dry conditions.  Generally, a late Summer - Fall peak is seen for common outdoor fungal  spores.  Rain will temporarily lower outdoor mold spore count, but counts rise rapidly when the rainy period ends.  The most important indoor molds are Aspergillus and Penicillium.  Dark, humid and poorly ventilated basements are ideal  sites for mold growth.  The next most common sites of mold growth are the bathroom and the kitchen.   Indoor (Perennial) Mold Control   Positive indoor molds via skin testing: Fusarium, Aureobasidium ([place]) and Rhizopus  1. Maintain humidity below 50%. 2. Clean washable surfaces with 5% bleach solution. 3. Remove sources e.g. contaminated carpets.     Control of Dog or Cat Allergen  Avoidance is the best way to manage a dog or cat allergy. If you have a dog or cat and are allergic to dog or cats, consider removing the dog or cat from the home. If you have a dog or cat but don't want to find it a new home, or if your family wants a pet even though someone in the household is allergic, here are some strategies that may help keep symptoms at bay:  1. Keep the pet out of your bedroom and restrict it to only a few rooms. Be advised that keeping the dog or cat in only one room will not limit the allergens to that room. 2. Don't pet, hug or kiss the dog or cat; if you do, wash your hands with soap and water. 3. High-efficiency particulate air (HEPA) cleaners run continuously in a bedroom or living room can reduce allergen levels over time. 4. Regular use of a high-efficiency vacuum cleaner or a central vacuum can reduce allergen levels. 5. Giving your dog or cat a bath at least once a week can reduce airborne allergen.  Control of Cockroach Allergen  Cockroach allergen has been identified as an important cause of acute attacks of asthma, especially in urban settings.  There are fifty-five species of cockroach that exist in the [place], however only three, the [place], [place] species produce allergen that can affect patients with Asthma.  Allergens can be obtained from fecal particles, egg casings and secretions from cockroaches.    1. Remove food sources. 2. Reduce access to water. 3. Seal access and entry points. 4. Spray runways with 0.5-1% Diazinon or  Chlorpyrifos 5. Blow boric acid power under stoves and refrigerator. 6. Place bait stations (hydramethylnon) at feeding sites.  Allergy Shots   Allergies are the result of a chain reaction that starts in the immune system. Your immune system controls how your body defends itself. For instance, if you have an allergy to pollen, your immune system identifies pollen as an invader or allergen. Your immune system overreacts by producing antibodies called Immunoglobulin E (IgE). These antibodies travel to cells that release chemicals, causing an allergic reaction.  The concept behind allergy immunotherapy, whether it is received in the form of shots or tablets, is that the immune system can be desensitized to specific allergens that trigger allergy symptoms. Although it requires time and patience, the payback can be long-term relief.  How Do Allergy Shots Work?  Allergy shots work much like a vaccine. Your body responds to injected amounts of a particular allergen given in increasing doses, eventually developing a resistance and tolerance to it. Allergy shots can lead to decreased, minimal or no allergy symptoms.  There generally are two phases: build-up and maintenance. Build-up often ranges from three to six months and involves receiving injections with increasing amounts of the allergens. The shots are typically given  once or twice a week, though more rapid build-up schedules are sometimes used.  The maintenance phase begins when the most effective dose is reached. This dose is different for each person, depending on how allergic you are and your response to the build-up injections. Once the maintenance dose is reached, there are longer periods between injections, typically two to four weeks.  Occasionally doctors give cortisone-type shots that can temporarily reduce allergy symptoms. These types of shots are different and should not be confused with allergy immunotherapy shots.  Who Can Be Treated  with Allergy Shots?  Allergy shots may be a good treatment approach for people with allergic rhinitis (hay fever), allergic asthma, conjunctivitis (eye allergy) or stinging insect allergy.   Before deciding to begin allergy shots, you should consider:  . The length of allergy season and the severity of your symptoms . Whether medications and/or changes to your environment can control your symptoms . Your desire to avoid long-term medication use . Time: allergy immunotherapy requires a major time commitment . Cost: may vary depending on your insurance coverage  Allergy shots for children age 28 and older are effective and often well tolerated. They might prevent the onset of new allergen sensitivities or the progression to asthma.  Allergy shots are not started on patients who are pregnant but can be continued on patients who become pregnant while receiving them. In some patients with other medical conditions or who take certain common medications, allergy shots may be of risk. It is important to mention other medications you talk to your allergist.   When Will I Feel Better?  Some may experience decreased allergy symptoms during the build-up phase. For others, it may take as long as 12 months on the maintenance dose. If there is no improvement after a year of maintenance, your allergist will discuss other treatment options with you.  If you aren't responding to allergy shots, it may be because there is not enough dose of the allergen in your vaccine or there are missing allergens that were not identified during your allergy testing. Other reasons could be that there are high levels of the allergen in your environment or major exposure to non-allergic triggers like tobacco smoke.  What Is the Length of Treatment?  Once the maintenance dose is reached, allergy shots are generally continued for three to five years. The decision to stop should be discussed with your allergist at that time. Some  people may experience a permanent reduction of allergy symptoms. Others may relapse and a longer course of allergy shots can be considered.  What Are the Possible Reactions?  The two types of adverse reactions that can occur with allergy shots are local and systemic. Common local reactions include very mild redness and swelling at the injection site, which can happen immediately or several hours after. A systemic reaction, which is less common, affects the entire body or a particular body system. They are usually mild and typically respond quickly to medications. Signs include increased allergy symptoms such as sneezing, a stuffy nose or hives.  Rarely, a serious systemic reaction called anaphylaxis can develop. Symptoms include swelling in the throat, wheezing, a feeling of tightness in the chest, nausea or dizziness. Most serious systemic reactions develop within 30 minutes of allergy shots. This is why it is strongly recommended you wait in your doctor's office for 30 minutes after your injections. Your allergist is trained to watch for reactions, and his or her staff is trained and equipped with the proper  medications to identify and treat them.  Who Should Administer Allergy Shots?  The preferred location for receiving shots is your prescribing allergist's office. Injections can sometimes be given at another facility where the physician and staff are trained to recognize and treat reactions, and have received instructions by your prescribing allergist.

## 2020-03-29 ENCOUNTER — Telehealth: Payer: Self-pay

## 2020-03-29 NOTE — Telephone Encounter (Signed)
My Chart message sent with this information.

## 2020-03-29 NOTE — Telephone Encounter (Signed)
I apologize for the confusion. Let's get some Allegra and use in place of Zyrtec to see if this helps with the postnasal drip. Can you send this back via MyChart?   Salvatore Marvel, MD [facility] [place] [facility] [place]

## 2020-04-02 ENCOUNTER — Institutional Professional Consult (permissible substitution): Payer: Medicare HMO | Admitting: Neurology

## 2020-04-02 ENCOUNTER — Ambulatory Visit (INDEPENDENT_AMBULATORY_CARE_PROVIDER_SITE_OTHER): Payer: Medicare HMO | Admitting: Neurology

## 2020-04-02 DIAGNOSIS — G4733 Obstructive sleep apnea (adult) (pediatric): Secondary | ICD-10-CM | POA: Diagnosis not present

## 2020-04-02 DIAGNOSIS — E669 Obesity, unspecified: Secondary | ICD-10-CM

## 2020-04-02 DIAGNOSIS — Z9989 Dependence on other enabling machines and devices: Secondary | ICD-10-CM

## 2020-04-02 DIAGNOSIS — Z8673 Personal history of transient ischemic attack (TIA), and cerebral infarction without residual deficits: Secondary | ICD-10-CM

## 2020-04-02 DIAGNOSIS — I639 Cerebral infarction, unspecified: Secondary | ICD-10-CM

## 2020-04-02 DIAGNOSIS — R351 Nocturia: Secondary | ICD-10-CM

## 2020-04-04 ENCOUNTER — Ambulatory Visit (INDEPENDENT_AMBULATORY_CARE_PROVIDER_SITE_OTHER): Payer: Medicare HMO

## 2020-04-04 DIAGNOSIS — I639 Cerebral infarction, unspecified: Secondary | ICD-10-CM

## 2020-04-04 DIAGNOSIS — G4733 Obstructive sleep apnea (adult) (pediatric): Secondary | ICD-10-CM | POA: Diagnosis not present

## 2020-04-04 DIAGNOSIS — I4891 Unspecified atrial fibrillation: Secondary | ICD-10-CM | POA: Diagnosis not present

## 2020-04-05 ENCOUNTER — Telehealth: Payer: Self-pay | Admitting: *Deleted

## 2020-04-05 NOTE — Telephone Encounter (Signed)
-----   Message from Star Age, MD sent at 04/05/2020  7:28 AM EST ----- Patient referred by Dr. Leonie Man and Janett Billow, seen by me on 03/13/20, HST on 04/03/20.    Please call and notify the patient that the recent home sleep test showed obstructive sleep apnea in the severe range.  She has been on AutoPap therapy.  She should be eligible for new equipment.  I will write for a new AutoPap machine.  She will need a follow-up appointment in about 10 weeks.  She has been compliant with her previous machine.  Please reinforce ongoing full compliance with treatment.  She currently has a DME company as I recall.  She can follow-up with Janett Billow or one of our other nurse practitioners or myself, whichever appointment is available in the timeframe she needs to be seen.    Star Age, MD, PhD [facility])

## 2020-04-05 NOTE — Progress Notes (Signed)
Patient referred by Dr. Leonie Man and Janett Billow, seen by me on 03/13/20, HST on 04/03/20.    Please call and notify the patient that the recent home sleep test showed obstructive sleep apnea in the severe range.  She has been on AutoPap therapy.  She should be eligible for new equipment.  I will write for a new AutoPap machine.  She will need a follow-up appointment in about 10 weeks.  She has been compliant with her previous machine.  Please reinforce ongoing full compliance with treatment.  She currently has a DME company as I recall.  She can follow-up with Janett Billow or one of our other nurse practitioners or myself, whichever appointment is available in the timeframe she needs to be seen.    Star Age, MD, PhD [facility])

## 2020-04-05 NOTE — Procedures (Signed)
   [facility]  HOME SLEEP TEST (Watch PAT)  STUDY DATE: 04/03/20  DOB: 1940-11-12  MRN: [id number]  ORDERING CLINICIAN: Star Age, MD, PhD   REFERRING CLINICIAN: Dr. Dimple Nanas, NP   CLINICAL INFORMATION/HISTORY: 79 year old woman with a history of stroke, irritable bowel syndrome, hypertension, hyperlipidemia, reflux disease, depression, arthritis, and obesity, who was previously diagnosed with obstructive sleep apnea and placed on PAP therapy.  She has an older autoPAP machine and is compliant with treatment.  She presents for reevaluation.  She is eligible for new equipment.  Epworth sleepiness score: 9/24.  BMI: 33.3 kg/m  FINDINGS:   Total Record Time (hours, min): 7 H 33 min  Total Sleep Time (hours, min):  6 H 44 min   Percent REM (%):    26.71 %   Calculated pAHI (per hour):  50.6       REM pAHI: 37.9    NREM pAHI: 55.2   Oxygen Saturation (%) Mean: 92  Minimum oxygen saturation (%): 86                   O2 Saturation Range (%): 97-86  O2Saturation (minutes) <=88%: 1.8  Pulse Mean (bpm):    61  Pulse Range (51-91)   IMPRESSION: OSA (obstructive sleep apnea)  RECOMMENDATION:  This home sleep test demonstrates severe obstructive sleep apnea -by a number of events - with a total AHI of 50.6/hour and O2 nadir of 86%. Ongoing treatment with positive airway pressure is recommended. The patient will be advised to proceed with AutoPAP therapy with new equipment. A full night titration study may be considered to optimize treatment settings, if needed down the road. Please note that untreated obstructive sleep apnea may carry additional perioperative morbidity. Patients with significant obstructive sleep apnea should receive perioperative PAP therapy and the surgeons and particularly the anesthesiologist should be informed of the diagnosis and the severity of the sleep disordered breathing. The patient should be cautioned not to drive, work at  heights, or operate dangerous or heavy equipment when tired or sleepy. Review and reiteration of good sleep hygiene measures should be pursued with any patient. Other causes of the patient's symptoms, including circadian rhythm disturbances, an underlying mood disorder, medication effect and/or an underlying medical problem cannot be ruled out based on this test. Clinical correlation is recommended. The patient and her referring provider will be notified of the test results. The patient will be seen in follow up in sleep clinic at [facility].  I certify that I have reviewed the raw data recording prior to the issuance of this report in accordance with the standards of the American Academy of Sleep Medicine (AASM).  INTERPRETING PHYSICIAN:    Star Age, MD, PhD  Board Certified in Neurology and Sleep Medicine  [facility] [address] [place], [place] [address] [phone]

## 2020-04-05 NOTE — Telephone Encounter (Signed)
Called and spoke with patient. Relayed results per Dr. Rexene Alberts note. Scheduled follow up with Dr. Rexene Alberts for 06/14/20 at 10:30am. Her DME is [employer]. She is aware orders for new machine will be sent to them and they will help get her set up. She was advised to call them if she does not hear anything in the next 1-2 weeks. She verbalized understanding.  Faxed order to [place] at [phone]. Received fax confirmation.

## 2020-04-05 NOTE — Addendum Note (Signed)
Addended by: Star Age on: 04/05/2020 07:28 AM   Modules accepted: Orders

## 2020-04-16 NOTE — Telephone Encounter (Signed)
Pt. states she is wondering if she needs to get sleep machine being that she has to pay a portion of it & it will only be upgraded for a heated hose. Please advise.

## 2020-04-16 NOTE — Telephone Encounter (Signed)
I called [employer]. Pt will have to pay a monthly rental fee for a new cpap. Pt has a ResMed S10 currently. It is able to be set to 7-13 cwp and pt may chose to keep her current machine.  I called pt, had an extended conversation with her regarding this. She would prefer to keep her current machine and will call [employer] back to ask them to make sure the current machine is set to 7-13 cwp.

## 2020-04-17 ENCOUNTER — Telehealth: Payer: Self-pay | Admitting: Adult Health

## 2020-04-17 NOTE — Telephone Encounter (Signed)
I called pt and she stated that she has had instance of feeling funny on Sunday 2 days ago.  She felt funny in her legs and arms/ hands.  Feels weaker.  Is using walker, no falls.  Having more of a hard time getting OOB, transferring.  PT finishes 12-2019, not exercising, is sedentary. On plavix. Hydrating well.  NO other acute sx.  Not sure what it is/was.  Her daughter told her to call.  Knows to go to ED for acute stroke sx. jm Please advise.

## 2020-04-17 NOTE — Telephone Encounter (Signed)
I would advise her to reach out to her PCP to rule out dehydration with onset of diarrhea or any other abnormality contributing to symptoms.

## 2020-04-17 NOTE — Telephone Encounter (Signed)
I called pt back after speaking to JM/NP about pt.  She stated that if episode (funny feeling she had) has passed and she is doing ok, then to keep diary of sx that she has.  Could be a number of things hydration, metabolic, blood sugar.  Encouraged eating regularly, keep hydrated.  Try to keep moving, using walker if needed.  If having issues with getting I and out of bed (height).  Exercising as tolderated is good for muscles and endorphins (feel good).  She appreciated call.  Will call us as needed.

## 2020-04-17 NOTE — Telephone Encounter (Signed)
I called pt back and she had 1 episode of diarrhea, not really an issue.  Has been hyrdating well.   She has forgotten our phone # and states some confusion about that.  I relayed will call her back after speaking with JM/NP.

## 2020-04-17 NOTE — Telephone Encounter (Signed)
Pt called wanting to speak to the RN about the weakness she has been feeling and problems walking even with her walker. Pt also states that she has been having Diarrhea as well. Please advise.

## 2020-04-18 ENCOUNTER — Emergency Department (HOSPITAL_COMMUNITY): Payer: Medicare HMO

## 2020-04-18 ENCOUNTER — Inpatient Hospital Stay (HOSPITAL_COMMUNITY): Payer: Medicare HMO

## 2020-04-18 ENCOUNTER — Other Ambulatory Visit: Payer: Self-pay

## 2020-04-18 ENCOUNTER — Inpatient Hospital Stay (HOSPITAL_COMMUNITY)
Admission: EM | Admit: 2020-04-18 | Discharge: 2020-04-23 | DRG: 065 | Disposition: A | Discharge status: Home Health | Payer: Medicare HMO | Attending: Internal Medicine | Admitting: Internal Medicine

## 2020-04-18 ENCOUNTER — Encounter (HOSPITAL_COMMUNITY): Payer: Self-pay | Admitting: Internal Medicine

## 2020-04-18 DIAGNOSIS — G4733 Obstructive sleep apnea (adult) (pediatric): Secondary | ICD-10-CM | POA: Diagnosis present

## 2020-04-18 DIAGNOSIS — G4730 Sleep apnea, unspecified: Secondary | ICD-10-CM | POA: Diagnosis present

## 2020-04-18 DIAGNOSIS — Z79899 Other long term (current) drug therapy: Secondary | ICD-10-CM | POA: Diagnosis not present

## 2020-04-18 DIAGNOSIS — Z8249 Family history of ischemic heart disease and other diseases of the circulatory system: Secondary | ICD-10-CM

## 2020-04-18 DIAGNOSIS — Z20822 Contact with and (suspected) exposure to covid-19: Secondary | ICD-10-CM | POA: Diagnosis present

## 2020-04-18 DIAGNOSIS — Z96652 Presence of left artificial knee joint: Secondary | ICD-10-CM | POA: Diagnosis present

## 2020-04-18 DIAGNOSIS — R197 Diarrhea, unspecified: Secondary | ICD-10-CM | POA: Diagnosis not present

## 2020-04-18 DIAGNOSIS — Z961 Presence of intraocular lens: Secondary | ICD-10-CM | POA: Diagnosis not present

## 2020-04-18 DIAGNOSIS — I63522 Cerebral infarction due to unspecified occlusion or stenosis of left anterior cerebral artery: Principal | ICD-10-CM | POA: Diagnosis present

## 2020-04-18 DIAGNOSIS — I6612 Occlusion and stenosis of left anterior cerebral artery: Secondary | ICD-10-CM | POA: Diagnosis not present

## 2020-04-18 DIAGNOSIS — I6602 Occlusion and stenosis of left middle cerebral artery: Secondary | ICD-10-CM | POA: Diagnosis not present

## 2020-04-18 DIAGNOSIS — Z7902 Long term (current) use of antithrombotics/antiplatelets: Secondary | ICD-10-CM

## 2020-04-18 DIAGNOSIS — I6389 Other cerebral infarction: Secondary | ICD-10-CM | POA: Diagnosis not present

## 2020-04-18 DIAGNOSIS — R61 Generalized hyperhidrosis: Secondary | ICD-10-CM | POA: Diagnosis present

## 2020-04-18 DIAGNOSIS — I69354 Hemiplegia and hemiparesis following cerebral infarction affecting left non-dominant side: Secondary | ICD-10-CM

## 2020-04-18 DIAGNOSIS — K580 Irritable bowel syndrome with diarrhea: Secondary | ICD-10-CM | POA: Diagnosis not present

## 2020-04-18 DIAGNOSIS — E7800 Pure hypercholesterolemia, unspecified: Secondary | ICD-10-CM | POA: Diagnosis present

## 2020-04-18 DIAGNOSIS — E782 Mixed hyperlipidemia: Secondary | ICD-10-CM | POA: Diagnosis present

## 2020-04-18 DIAGNOSIS — Z885 Allergy status to narcotic agent status: Secondary | ICD-10-CM

## 2020-04-18 DIAGNOSIS — F32A Depression, unspecified: Secondary | ICD-10-CM | POA: Diagnosis not present

## 2020-04-18 DIAGNOSIS — Z9841 Cataract extraction status, right eye: Secondary | ICD-10-CM

## 2020-04-18 DIAGNOSIS — Z96641 Presence of right artificial hip joint: Secondary | ICD-10-CM | POA: Diagnosis not present

## 2020-04-18 DIAGNOSIS — R200 Anesthesia of skin: Secondary | ICD-10-CM | POA: Diagnosis not present

## 2020-04-18 DIAGNOSIS — I639 Cerebral infarction, unspecified: Secondary | ICD-10-CM | POA: Diagnosis not present

## 2020-04-18 DIAGNOSIS — Z6833 Body mass index (BMI) 33.0-33.9, adult: Secondary | ICD-10-CM | POA: Diagnosis not present

## 2020-04-18 DIAGNOSIS — I10 Essential (primary) hypertension: Secondary | ICD-10-CM | POA: Diagnosis not present

## 2020-04-18 DIAGNOSIS — R29700 NIHSS score 0: Secondary | ICD-10-CM | POA: Diagnosis present

## 2020-04-18 DIAGNOSIS — Z9842 Cataract extraction status, left eye: Secondary | ICD-10-CM | POA: Diagnosis not present

## 2020-04-18 DIAGNOSIS — Z8673 Personal history of transient ischemic attack (TIA), and cerebral infarction without residual deficits: Secondary | ICD-10-CM | POA: Diagnosis present

## 2020-04-18 DIAGNOSIS — I63322 Cerebral infarction due to thrombosis of left anterior cerebral artery: Secondary | ICD-10-CM | POA: Diagnosis not present

## 2020-04-18 DIAGNOSIS — K219 Gastro-esophageal reflux disease without esophagitis: Secondary | ICD-10-CM | POA: Diagnosis present

## 2020-04-18 DIAGNOSIS — E785 Hyperlipidemia, unspecified: Secondary | ICD-10-CM | POA: Diagnosis present

## 2020-04-18 DIAGNOSIS — R29818 Other symptoms and signs involving the nervous system: Secondary | ICD-10-CM | POA: Diagnosis not present

## 2020-04-18 DIAGNOSIS — E6609 Other obesity due to excess calories: Secondary | ICD-10-CM

## 2020-04-18 DIAGNOSIS — I6523 Occlusion and stenosis of bilateral carotid arteries: Secondary | ICD-10-CM | POA: Diagnosis not present

## 2020-04-18 DIAGNOSIS — Z6830 Body mass index (BMI) 30.0-30.9, adult: Secondary | ICD-10-CM

## 2020-04-18 HISTORY — DX: Cerebral infarction, unspecified: I63.9

## 2020-04-18 LAB — CBC
HCT: 48.7 % — ABNORMAL HIGH (ref 36.0–46.0)
Hemoglobin: 15.9 g/dL — ABNORMAL HIGH (ref 12.0–15.0)
MCH: 31.1 pg (ref 26.0–34.0)
MCHC: 32.6 g/dL (ref 30.0–36.0)
MCV: 95.1 fL (ref 80.0–100.0)
Platelets: 293 10*3/uL (ref 150–400)
RBC: 5.12 MIL/uL — ABNORMAL HIGH (ref 3.87–5.11)
RDW: 12.8 % (ref 11.5–15.5)
WBC: 7.3 10*3/uL (ref 4.0–10.5)
nRBC: 0 % (ref 0.0–0.2)

## 2020-04-18 LAB — LIPASE, BLOOD: Lipase: 39 U/L (ref 11–51)

## 2020-04-18 LAB — I-STAT CHEM 8, ED
BUN: 11 mg/dL (ref 8–23)
Calcium, Ion: 1.29 mmol/L (ref 1.15–1.40)
Chloride: 104 mmol/L (ref 98–111)
Creatinine, Ser: 0.8 mg/dL (ref 0.44–1.00)
Glucose, Bld: 108 mg/dL — ABNORMAL HIGH (ref 70–99)
HCT: 49 % — ABNORMAL HIGH (ref 36.0–46.0)
Hemoglobin: 16.7 g/dL — ABNORMAL HIGH (ref 12.0–15.0)
Potassium: 4.1 mmol/L (ref 3.5–5.1)
Sodium: 141 mmol/L (ref 135–145)
TCO2: 25 mmol/L (ref 22–32)

## 2020-04-18 LAB — DIFFERENTIAL
Abs Immature Granulocytes: 0.03 10*3/uL (ref 0.00–0.07)
Basophils Absolute: 0.1 10*3/uL (ref 0.0–0.1)
Basophils Relative: 1 %
Eosinophils Absolute: 0.1 10*3/uL (ref 0.0–0.5)
Eosinophils Relative: 1 %
Immature Granulocytes: 0 %
Lymphocytes Relative: 15 %
Lymphs Abs: 1.1 10*3/uL (ref 0.7–4.0)
Monocytes Absolute: 0.4 10*3/uL (ref 0.1–1.0)
Monocytes Relative: 6 %
Neutro Abs: 5.7 10*3/uL (ref 1.7–7.7)
Neutrophils Relative %: 77 %

## 2020-04-18 LAB — COMPREHENSIVE METABOLIC PANEL
ALT: 21 U/L (ref 0–44)
AST: 30 U/L (ref 15–41)
Albumin: 4.3 g/dL (ref 3.5–5.0)
Alkaline Phosphatase: 104 U/L (ref 38–126)
Anion gap: 11 (ref 5–15)
BUN: 10 mg/dL (ref 8–23)
CO2: 24 mmol/L (ref 22–32)
Calcium: 10.2 mg/dL (ref 8.9–10.3)
Chloride: 105 mmol/L (ref 98–111)
Creatinine, Ser: 0.98 mg/dL (ref 0.44–1.00)
GFR, Estimated: 59 mL/min — ABNORMAL LOW (ref >60)
Glucose, Bld: 114 mg/dL — ABNORMAL HIGH (ref 70–99)
Potassium: 3.9 mmol/L (ref 3.5–5.1)
Sodium: 140 mmol/L (ref 135–145)
Total Bilirubin: 1.2 mg/dL (ref 0.3–1.2)
Total Protein: 6.7 g/dL (ref 6.5–8.1)

## 2020-04-18 LAB — URINALYSIS, ROUTINE W REFLEX MICROSCOPIC
Bacteria, UA: NONE SEEN
Bilirubin Urine: NEGATIVE
Glucose, UA: NEGATIVE mg/dL
Ketones, ur: 5 mg/dL — AB
Leukocytes,Ua: NEGATIVE
Nitrite: NEGATIVE
Protein, ur: NEGATIVE mg/dL
Specific Gravity, Urine: 1.01 (ref 1.005–1.030)
pH: 5 (ref 5.0–8.0)

## 2020-04-18 LAB — APTT: aPTT: 36 seconds (ref 24–36)

## 2020-04-18 LAB — PROTIME-INR
INR: 1 (ref 0.8–1.2)
Prothrombin Time: 13.1 seconds (ref 11.4–15.2)

## 2020-04-18 LAB — CBG MONITORING, ED: Glucose-Capillary: 113 mg/dL — ABNORMAL HIGH (ref 70–99)

## 2020-04-18 LAB — RESP PANEL BY RT-PCR (FLU A&B, COVID) ARPGX2
Influenza A by PCR: NEGATIVE
Influenza B by PCR: NEGATIVE
SARS Coronavirus 2 by RT PCR: NEGATIVE

## 2020-04-18 MED ORDER — CLOPIDOGREL BISULFATE 75 MG PO TABS
75.0000 mg | ORAL_TABLET | Freq: Every day | ORAL | Status: DC
  Administered 2020-04-19: 75 mg via ORAL
  Filled 2020-04-18: qty 1

## 2020-04-18 MED ORDER — SENNOSIDES-DOCUSATE SODIUM 8.6-50 MG PO TABS
1.0000 | ORAL_TABLET | Freq: Every evening | ORAL | Status: DC | PRN
  Filled 2020-04-18: qty 1

## 2020-04-18 MED ORDER — MONTELUKAST SODIUM 10 MG PO TABS
10.0000 mg | ORAL_TABLET | Freq: Every day | ORAL | Status: DC
  Administered 2020-04-18 – 2020-04-22 (×5): 10 mg via ORAL
  Filled 2020-04-18 (×5): qty 1

## 2020-04-18 MED ORDER — ATORVASTATIN CALCIUM 80 MG PO TABS
80.0000 mg | ORAL_TABLET | Freq: Every evening | ORAL | Status: DC
  Administered 2020-04-18 – 2020-04-22 (×5): 80 mg via ORAL
  Filled 2020-04-18 (×5): qty 1

## 2020-04-18 MED ORDER — IPRATROPIUM BROMIDE 0.06 % NA SOLN
1.0000 | Freq: Two times a day (BID) | NASAL | Status: DC
  Administered 2020-04-19 – 2020-04-23 (×5): 1 via NASAL
  Filled 2020-04-18: qty 15

## 2020-04-18 MED ORDER — ENOXAPARIN SODIUM 40 MG/0.4ML ~~LOC~~ SOLN
40.0000 mg | SUBCUTANEOUS | Status: DC
  Administered 2020-04-18 – 2020-04-22 (×5): 40 mg via SUBCUTANEOUS
  Filled 2020-04-18 (×5): qty 0.4

## 2020-04-18 MED ORDER — ASPIRIN 300 MG RE SUPP: 300.0000 mg | Freq: Every day | RECTAL | Status: DC

## 2020-04-18 MED ORDER — HYDROXYZINE HCL 10 MG PO TABS
5.0000 mg | ORAL_TABLET | Freq: Three times a day (TID) | ORAL | Status: DC | PRN
  Filled 2020-04-18: qty 1

## 2020-04-18 MED ORDER — SODIUM CHLORIDE 0.9 % IV SOLN: INTRAVENOUS | Status: DC

## 2020-04-18 MED ORDER — ASPIRIN 325 MG PO TABS
325.0000 mg | ORAL_TABLET | Freq: Every day | ORAL | Status: DC
  Administered 2020-04-19: 325 mg via ORAL
  Filled 2020-04-18: qty 1

## 2020-04-18 MED ORDER — ACETAMINOPHEN 160 MG/5ML PO SOLN: 650.0000 mg | ORAL | Status: DC | PRN

## 2020-04-18 MED ORDER — ACETAMINOPHEN 325 MG PO TABS
650.0000 mg | ORAL_TABLET | ORAL | Status: DC | PRN
  Administered 2020-04-21 – 2020-04-22 (×2): 650 mg via ORAL
  Filled 2020-04-18 (×3): qty 2

## 2020-04-18 MED ORDER — CLOPIDOGREL BISULFATE 75 MG PO TABS
75.0000 mg | ORAL_TABLET | Freq: Every day | ORAL | Status: DC
  Administered 2020-04-18: 75 mg via ORAL
  Filled 2020-04-18: qty 1

## 2020-04-18 MED ORDER — STROKE: EARLY STAGES OF RECOVERY BOOK
Freq: Once | Status: AC
  Administered 2020-04-18: 1
  Filled 2020-04-18: qty 1

## 2020-04-18 MED ORDER — SODIUM CHLORIDE 0.9% FLUSH
3.0000 mL | Freq: Once | INTRAVENOUS | Status: AC
  Administered 2020-04-18: 3 mL via INTRAVENOUS

## 2020-04-18 MED ORDER — ACETAMINOPHEN 650 MG RE SUPP: 650.0000 mg | RECTAL | Status: DC | PRN

## 2020-04-18 MED ORDER — CLOPIDOGREL BISULFATE 75 MG PO TABS: 75.0000 mg | ORAL_TABLET | Freq: Every day | ORAL | Status: DC

## 2020-04-18 MED ORDER — ASPIRIN 325 MG PO TABS
325.0000 mg | ORAL_TABLET | Freq: Once | ORAL | Status: AC
  Administered 2020-04-18: 325 mg via ORAL
  Filled 2020-04-18: qty 1

## 2020-04-18 NOTE — ED Provider Notes (Addendum)
Care assumed from [place] at shift change, please see her note for full details, but in brief [name] [name] is a 79 y.o. female with history of prior strokes, who presents with left arm numbness and tingling that has been intermittent, last known well time 2 to 3 days ago.  On arrival here she has normal neurologic exam without focal deficits.  NIH scale 0.  Labs overall have been unremarkable.  CT with chronic infarcts noted.  MRI consistent with left ACA territory infarct.  This is patient's third stroke in the past year.  Will require neuro consult and admission, care signed out pending consult calls.  BP 127/70   Pulse 72   Temp 98.5 F (36.9 C) (Oral)   Resp 15   Ht 5\' 2"  (1.575 m)   Wt 76.7 kg   SpO2 96%   BMI 30.91 kg/m    ED Course/Procedures   Labs Reviewed  CBC - Abnormal; Notable for the following components:      Result Value   RBC 5.12 (*)    Hemoglobin 15.9 (*)    HCT 48.7 (*)    All other components within normal limits  COMPREHENSIVE METABOLIC PANEL - Abnormal; Notable for the following components:   Glucose, Bld 114 (*)    GFR, Estimated 59 (*)    All other components within normal limits  URINALYSIS, ROUTINE W REFLEX MICROSCOPIC - Abnormal; Notable for the following components:   Hgb urine dipstick SMALL (*)    Ketones, ur 5 (*)    Non Squamous Epithelial 0-5 (*)    All other components within normal limits  CBG MONITORING, ED - Abnormal; Notable for the following components:   Glucose-Capillary 113 (*)    All other components within normal limits  I-STAT CHEM 8, ED - Abnormal; Notable for the following components:   Glucose, Bld 108 (*)    Hemoglobin 16.7 (*)    HCT 49.0 (*)    All other components within normal limits  URINE CULTURE  PROTIME-INR  APTT  DIFFERENTIAL  LIPASE, BLOOD  CBG MONITORING, ED    CT HEAD WO CONTRAST  Result Date: 04/18/2020 CLINICAL DATA:  Numbness. EXAM: CT HEAD WITHOUT CONTRAST TECHNIQUE: Contiguous axial  images were obtained from the base of the skull through the vertex without intravenous contrast. COMPARISON:  February 16, 2020. FINDINGS: Brain: Mild chronic ischemic white matter disease is noted. 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 No mass effect or midline shift is noted. Ventricular size is within normal limits. There is no evidence of mass lesion, hemorrhage or acute infarction. Vascular: No hyperdense vessel or unexpected calcification. Skull: Normal. Negative for fracture or focal lesion. Sinuses/Orbits: No acute finding. Other: None. IMPRESSION: Mild chronic ischemic white matter disease. 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 No acute intracranial abnormality seen. Electronically Signed   By: Marijo Conception M.D.   On: 04/18/2020 13:45   MR BRAIN WO CONTRAST  Result Date: 04/18/2020 CLINICAL DATA:  Numbness of right side EXAM: MRI HEAD WITHOUT CONTRAST TECHNIQUE: Multiplanar, multiecho pulse sequences of the brain and surrounding structures were obtained without intravenous contrast. COMPARISON:  02/16/2020 FINDINGS: Brain: There is patchy reduced diffusion involving the left superior frontal gyrus, cingulate gyrus, and body of the corpus callosum. There is also some involvement of the parasagittal left parietal lobe. Left parietal focus susceptibility is again identified and probably reflects chronic microhemorrhage. Patchy and confluent areas of T2 hyperintensity in the supratentorial and pontine white matter  are nonspecific but probably reflects stable moderate to advanced chronic microvascular ischemic changes. There is a chronic small vessel infarct of the right corona radiata. There is no intracranial mass or mass effect. Ventricles are stable in size. No hydrocephalus or extra-axial collection. Vascular: Major vessel flow voids at the skull base are preserved. Skull and upper cervical spine: Normal marrow signal is preserved. Sinuses/Orbits: Trace mucosal  thick.  Bilateral lens replacement. Other: Sella is unremarkable.  Mastoid air cells are clear. IMPRESSION: Acute left ACA territory infarcts.  No hemorrhage or mass effect. Stable chronic findings detailed above including moderate to advanced chronic microvascular ischemic changes. Electronically Signed   By: Macy Mis M.D.   On: 04/18/2020 15:41     Procedures  MDM   MRI with acute stroke of the left ACA territory, currently she has normal neurologic exam and NIH stroke of 0.  This is her third stroke in the past year.  Will consult neurology and patient will require hospital admission.  Case discussed with Dr. Lorrin Goodell with neurology who will see patient in consult, agrees with plan for medicine admission, request patient be given dose of aspirin while here in the ED.  Medicine admission consult placed.  Case discussed with Dr. Lorin Mercy with [facility], who will see and admit the patient.      Jacqlyn Larsen, PA-C 04/18/20 1625    Veryl Speak, MD 04/18/20 2326

## 2020-04-18 NOTE — ED Provider Notes (Signed)
[facility] EMERGENCY DEPARTMENT Provider Note   CSN: [id number] Arrival date & time: 04/18/20  3149     History Chief Complaint  Patient presents with  . Numbness    [name] [name] is a 79 y.o. 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  Patient states symptoms have been intermittent in nature associated with weakness.  Patient has been compliant with her Plavix.  Denies any visual and speech changes.  Daughter is at bedside and notes that patient has been "out of it" for the past few days and hasn't been acting like herself. Denies fever and chills. Denies headache and head injury. Denies dysuria and vaginal symptoms.  Patient also admits to 3-4 episodes of non-bloody diarrhea over the past 24 hours which she attributes to eating sugar free candy. Denies recent antibiotics and ingestion of undercooked foods. Denies associated abdominal pain, nausea, and vomiting. Admits to not eating and drinking as much as normal over the past few days. Denies sick contacts and known COVID exposures.  History obtained from patient, daughter, and past medical records. No interpreter used during encounter.      Past Medical History:  Diagnosis Date  . Arthritis    knees  . Depression   . Fatty liver   . GERD (gastroesophageal reflux disease)   . Hyperlipidemia   . Hypertension   . IBS (irritable bowel syndrome)   . Obesity   . Pneumonia   . Sleep apnea    uses CPAP    Patient Active Problem List   Diagnosis Date Noted  . Seasonal and perennial allergic rhinitis 03/28/2020  . HLD (hyperlipidemia) 02/16/2020  . Stroke ([place]) 02/16/2020  . Fall 02/16/2020  . Abdominal wall hernia 10/30/2019  . Change in voice 10/30/2019  .  History of stroke 09/14/2019  . Spinal stenosis 07/24/2019  . Mixed hyperlipidemia   . OSA (obstructive sleep apnea)   . Dysfunction of eustachian tube 05/15/2019  . Other social stressor 03/29/2019  . 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 Diarrhea 06/20/2018  . Joint pain 06/20/2018  . 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 Medicare annual wellness visit, subsequent 09/15/2017  . SUI (stress urinary incontinence, female) 08/07/2017  . Back pain 06/16/2016  . Advance care planning 01/07/2016  . Sleep apnea   . 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 Allergic rhinitis 11/24/2014  . Adult BMI 30+ 11/24/2014  . Depression 11/24/2014  . Insomnia 08/23/2009  . Headache, migraine 05/10/2009  . HERPES SIMPLEX INFECTION 08/14/2006  . HYPERCHOLESTEROLEMIA 08/14/2006  . Anxiety state 08/14/2006  . Essential hypertension 08/14/2006  . HIATAL HERNIA 08/14/2006  . IRRITABLE BOWEL SYNDROME 08/14/2006  . 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 Primary osteoarthritis of right knee 08/14/2006  . PLANTAR FASCIITIS 08/14/2006    Past Surgical History:  Procedure Laterality Date  . BLADDER SURGERY    . BREAST SURGERY     breast biopsy  . BROW LIFT Bilateral 04/14/2017   Procedure: BLEPHAROPLASTY UPPER EYELID WITH EXCESS SKIN;  Surgeon: Karle Starch, MD;  Location: [facility];  Service: Ophthalmology;  Laterality: Bilateral;  . CATARACT EXTRACTION W/PHACO Left 02/05/2016   Procedure: CATARACT EXTRACTION PHACO AND INTRAOCULAR LENS PLACEMENT (IOC);  Surgeon: Gwyndolyn Saxon  Porfilio, MD;  Location: [facility];  Service: Ophthalmology;  Laterality: Left;  [place] 01:10 AP% 22.3 CDE 15.67 Fluid pack lot # [id number]  . CATARACT EXTRACTION W/PHACO Right 02/26/2016   Procedure: CATARACT EXTRACTION PHACO AND INTRAOCULAR LENS PLACEMENT (IOC);  Surgeon: Birder Robson, MD;  Location: [facility];  Service: Ophthalmology;  Laterality: Right;  [place] 57.4 AP%  24.0 CDE 13.75 Fluid Pack lot # [id number]  . CHOLECYSTECTOMY    . COLONOSCOPY WITH PROPOFOL N/A 12/18/2014   Procedure: COLONOSCOPY WITH PROPOFOL;  Surgeon: Manya Silvas, MD;  Location: [facility];  Service: Endoscopy;  Laterality: N/A;  . DEEP NECK LYMPH NODE BIOPSY / EXCISION    . JOINT REPLACEMENT    . KNEE ARTHROPLASTY Left 06/20/2015   Procedure: COMPUTER ASSISTED TOTAL KNEE ARTHROPLASTY;  Surgeon: Dereck Leep, MD;  Location: [facility];  Service: Orthopedics;  Laterality: Left;  . PTOSIS REPAIR Bilateral 04/14/2017   Procedure: PTOSIS REPAIR RESECT EX;  Surgeon: Karle Starch, MD;  Location: [facility];  Service: Ophthalmology;  Laterality: Bilateral;  sleep apnea  . TONSILLECTOMY    . TOTAL HIP ARTHROPLASTY Right 07/21/2018   Procedure: TOTAL HIP ARTHROPLASTY ANTERIOR APPROACH;  Surgeon: Gaynelle Arabian, MD;  Location: [facility];  Service: Orthopedics;  Laterality: Right;     OB History   No obstetric history on file.     Family History  Problem Relation Age of Onset  . Heart attack Mother   . Dementia Father   . Aortic aneurysm Brother   . Colon cancer Neg Hx   . Breast cancer Neg Hx     Social History   Tobacco Use  . Smoking status: Never Smoker  . Smokeless tobacco: Never Used  Vaping Use  . Vaping Use: Never used  Substance Use Topics  . Alcohol use: Yes    Comment: ocassional glass of wine 2-3 times a year  . Drug use: No    Home Medications Prior to Admission medications   Medication Sig Start Date End Date Taking? Authorizing Provider  acetaminophen (TYLENOL) 500 MG tablet Take 500 mg by mouth every 6 (six) hours as needed for moderate pain.    Yes [provider]  atenolol (TENORMIN) 50 MG tablet TAKE ONE TABLET TWICE DAILY Patient taking differently: Take 50 mg by mouth 2 (two) times daily.  12/26/19  Yes Tonia Ghent, MD  atorvastatin (LIPITOR) 80 MG tablet TAKE ONE TABLET BY MOUTH EVERY DAY AT 6PM Patient taking differently:  Take 80 mg by mouth every evening.  01/09/20  Yes Tonia Ghent, MD  Biotin 10000 MCG TABS Take 5,000 mcg by mouth daily.    Yes [provider]  chlorpheniramine (CHLOR-TRIMETON) 4 MG tablet Take 4 mg by mouth daily as needed for allergies.   Yes [provider]  cholecalciferol (VITAMIN D) 1000 UNITS tablet Take 1,000 Units by mouth daily.    Yes [provider]  clopidogrel (PLAVIX) 75 MG tablet Take 1 tablet (75 mg total) by mouth daily. 03/01/20  Yes McCue, Janett Billow, NP  hydrOXYzine (ATARAX/VISTARIL) 10 MG tablet TAKE 1/2-1 TABLET BY MOUTH 3 TIMES DAILYAS NEEDED FOR ANXIETY (SEDATION CAUTION) Patient taking differently: Take 5-10 mg by mouth 3 (three) times daily as needed (ANXIETY (SEDATION CAUTION)).  03/12/20  Yes Tonia Ghent, MD  ipratropium (ATROVENT) 0.06 % nasal spray one spray per nostril twice daily (up to three times daily) to help with runny nose Patient taking differently: Place 1 spray into both nostrils  in the morning.  03/28/20  Yes Valentina Shaggy, MD  magnesium oxide (MAG-OX) 400 MG tablet Take 500 mg by mouth every other day.    Yes [provider]  montelukast (SINGULAIR) 10 MG tablet Take 1 tablet (10 mg total) by mouth at bedtime. 03/28/20  Yes Valentina Shaggy, MD  fluticasone [facility]) 50 MCG/ACT nasal spray Place 2 sprays into both nostrils daily as needed for allergies or rhinitis. Patient not taking: Reported on 04/18/2020    [provider]  senna-docusate (SENOKOT-S) 8.6-50 MG tablet Take 1 tablet by mouth at bedtime as needed for mild constipation. Patient not taking: Reported on 04/18/2020 02/18/20   Raiford Noble Latif, DO    Allergies    Hydrocodone  Review of Systems   Review of Systems  Constitutional: Negative for chills and fever.  Respiratory: Negative for shortness of breath.   Cardiovascular: Negative for chest pain.  Gastrointestinal: Positive for diarrhea. Negative for abdominal pain,  nausea and vomiting.  Genitourinary: Negative for dysuria.  Musculoskeletal: Negative for back pain.  Neurological: Positive for weakness (right sided) and numbness (right sided). Negative for facial asymmetry and headaches.  All other systems reviewed and are negative.   Physical Exam Updated Vital Signs BP 127/70 (BP Location: Right Arm)   Pulse 72   Temp 98.5 F (36.9 C) (Oral)   Resp 16   Ht 5\' 2"  (1.575 m)   Wt 76.7 kg   SpO2 96%   BMI 30.91 kg/m   Physical Exam Vitals and nursing note reviewed.  Constitutional:      General: She is not in acute distress.    Appearance: She is not ill-appearing.  HENT:     Head: Normocephalic.  Eyes:     Pupils: Pupils are equal, round, and reactive to light.  Cardiovascular:     Rate and Rhythm: Normal rate and regular rhythm.     Pulses: Normal pulses.     Heart sounds: Normal heart sounds. No murmur heard.  No friction rub. No gallop.   Pulmonary:     Effort: Pulmonary effort is normal.     Breath sounds: Normal breath sounds.  Abdominal:     General: Abdomen is flat. Bowel sounds are normal. There is no distension.     Palpations: Abdomen is soft.     Tenderness: There is no abdominal tenderness. There is no guarding or rebound.     Comments: Abdomen soft, nondistended, nontender to palpation in all quadrants without guarding or peritoneal signs. No rebound.   Musculoskeletal:     Cervical back: Neck supple.  Skin:    General: Skin is warm and dry.  Neurological:     General: No focal deficit present.     Mental Status: She is alert.     Comments: Speech is clear, able to follow commands CN III-XII intact Normal strength in upper and lower extremities bilaterally including dorsiflexion and plantar flexion, strong and equal grip strength Sensation grossly intact throughout Moves extremities without ataxia, coordination intact No pronator drift   Psychiatric:        Mood and Affect: Mood normal.        Behavior:  Behavior normal.     ED Results / Procedures / Treatments   Labs (all labs ordered are listed, but only abnormal results are displayed) Labs Reviewed  CBC - Abnormal; Notable for the following components:      Result Value   RBC 5.12 (*)    Hemoglobin 15.9 (*)  HCT 48.7 (*)    All other components within normal limits  COMPREHENSIVE METABOLIC PANEL - Abnormal; Notable for the following components:   Glucose, Bld 114 (*)    GFR, Estimated 59 (*)    All other components within normal limits  URINALYSIS, ROUTINE W REFLEX MICROSCOPIC - Abnormal; Notable for the following components:   Hgb urine dipstick SMALL (*)    Ketones, ur 5 (*)    Non Squamous Epithelial 0-5 (*)    All other components within normal limits  CBG MONITORING, ED - Abnormal; Notable for the following components:   Glucose-Capillary 113 (*)    All other components within normal limits  I-STAT CHEM 8, ED - Abnormal; Notable for the following components:   Glucose, Bld 108 (*)    Hemoglobin 16.7 (*)    HCT 49.0 (*)    All other components within normal limits  URINE CULTURE  PROTIME-INR  APTT  DIFFERENTIAL  LIPASE, BLOOD  CBG MONITORING, ED    EKG EKG Interpretation  Date/Time:  Wednesday April 18 2020 09:36:27 EST Ventricular Rate:  70 PR Interval:  168 QRS Duration: 82 QT Interval:  392 QTC Calculation: 423 R Axis:   8 Text Interpretation: Sinus rhythm with occasional Premature ventricular complexes Non-specific ST-t changes Confirmed by Lajean Saver [phone]) on 04/18/2020 10:05:08 AM   Radiology CT HEAD WO CONTRAST  Result Date: 04/18/2020 CLINICAL DATA:  Numbness. EXAM: CT HEAD WITHOUT CONTRAST TECHNIQUE: Contiguous axial images were obtained from the base of the skull through the vertex without intravenous contrast. COMPARISON:  February 16, 2020. FINDINGS: Brain: Mild chronic ischemic white matter disease is noted. 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 No mass  effect or midline shift is noted. Ventricular size is within normal limits. There is no evidence of mass lesion, hemorrhage or acute infarction. Vascular: No hyperdense vessel or unexpected calcification. Skull: Normal. Negative for fracture or focal lesion. Sinuses/Orbits: No acute finding. Other: None. IMPRESSION: Mild chronic ischemic white matter disease. 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 No acute intracranial abnormality seen. Electronically Signed   By: Marijo Conception M.D.   On: 04/18/2020 13:45    Procedures Procedures (including critical care time)  Medications Ordered in ED Medications  sodium chloride flush (NS) 0.9 % injection 3 mL (3 mLs Intravenous Given 04/18/20 1003)    ED Course  I have reviewed the triage vital signs and the nursing notes.  Pertinent labs & imaging results that were available during my care of the patient were reviewed by me and considered in my medical decision making (see chart for details).    MDM Rules/Calculators/A&P                         79 year old female presents to the ED due to right-sided numbness/tingling associated weakness for the past 2 to 3 days.  History of previous CVA in September of February of this year.  Denies visual and speech changes.  Is currently on Plavix which she has been compliant with.  Patient also admits to 2-3 episodes of nonbloody diarrhea over the past 24 hours.  Denies fever and chills.  Denies abdominal pain.  Upon arrival, patient is afebrile, not tachycardic or hypoxic.  Patient in no acute distress and non-ill-appearing.  Physical exam reassuring.  Normal neurological exam.  No appreciated right-sided weakness.  Sensation intact bilaterally.  Abdomen soft, nondistended, nontender.  No concern for acute abdomen at this time. Suspect diarrhea related  to viral etiology vs. Possible ingestion of sugar free candy which she notes has occurred in the past. Will obtain routine labs to rule out electrolyte  abnormalities given diarrhea. UA to rule out UTI. CT head to rule out stroke. Will most likely need MRI if CT is normal.  CBC reassuring with no leukocytosis.  CMP reassuring with normal renal function.  Mild hyperglycemia at 114.  Normal anion gap.  Doubt DKA.  EKG personally reviewed which demonstrates normal sinus rhythm with occasional PVCs.  Nonspecific ST changes with no changes from previous EKG. UA significant for small hematuria.  No leukocytes or nitrates.  Low suspicion for acute cystitis. CT head personally reviewed which demonstrates: IMPRESSION:  Mild chronic ischemic white matter disease. Old right  periventricular white matter infarction. No acute intracranial  abnormality seen.   Will obtain MRI to rule out possible CVA.   Discussed case with Dr. Ashok Cordia who evaluated patient at bedside and agrees with assessment and plan.   Patient handed off to Benedetto Goad, PA-C at shift change pending MRI. If MRI is normal, patient may be discharged home with follow-up with neurology at [facility].  Final Clinical Impression(s) / ED Diagnoses Final diagnoses:  Numbness and tingling of right arm and leg  Diarrhea, unspecified type    Rx / DC Orders ED Discharge Orders    None       Karie Kirks 04/18/20 1507    Lajean Saver, MD 04/23/20 [phone]

## 2020-04-18 NOTE — ED Notes (Signed)
To ct

## 2020-04-18 NOTE — ED Notes (Signed)
Pt transported to MRI 

## 2020-04-18 NOTE — ED Triage Notes (Signed)
C/O numbness on right arm that started through out the night when she tried switching position. Stated she went to bed at 11 pm and she felt fine. Patient endorsed hx of stroke x 2.

## 2020-04-18 NOTE — Consult Note (Signed)
NEUROLOGY CONSULTATION NOTE   Date of service: April 18, 2020 Patient Name: [name] [name] MRN:  [id number] DOB:  09-Jan-1941 Reason for consult: "L ACA stroke"  History of Present Illness  [name] [name] is a 79 y.o. 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  MRI Brain demonstrates acute left ACA infarcts.  She has been wearing a holter for the last 2 weeks and had it on when her symptoms started.  ROS   A detailed ROS was obtained and was negative except above.  Past History   Past Medical History:  Diagnosis Date  . Arthritis    knees  . Depression   . Fatty liver   . GERD (gastroesophageal reflux disease)   . Hyperlipidemia   . Hypertension   . IBS (irritable bowel syndrome)   . Obesity   . Pneumonia   . Sleep apnea    uses CPAP   Past Surgical History:  Procedure Laterality Date  . BLADDER SURGERY    . BREAST SURGERY     breast biopsy  . BROW LIFT Bilateral 04/14/2017   Procedure: BLEPHAROPLASTY UPPER EYELID WITH EXCESS SKIN;  Surgeon: Karle Starch, MD;  Location: [place];  Service: Ophthalmology;  Laterality: Bilateral;  . CATARACT EXTRACTION W/PHACO Left 02/05/2016   Procedure: CATARACT EXTRACTION PHACO AND INTRAOCULAR LENS PLACEMENT (IOC);  Surgeon: Birder Robson, MD;  Location: [facility];  Service: Ophthalmology;  Laterality: Left;  [place] 01:10 AP% 22.3 CDE 15.67 Fluid pack lot # [id number]  . CATARACT EXTRACTION W/PHACO Right 02/26/2016   Procedure: CATARACT EXTRACTION PHACO AND INTRAOCULAR LENS PLACEMENT (IOC);  Surgeon: Birder Robson, MD;  Location: [facility];  Service: Ophthalmology;  Laterality: Right;  [place] 57.4 AP% 24.0 CDE 13.75 Fluid Pack lot # [id number]  . CHOLECYSTECTOMY    . COLONOSCOPY WITH PROPOFOL N/A  12/18/2014   Procedure: COLONOSCOPY WITH PROPOFOL;  Surgeon: Manya Silvas, MD;  Location: [facility];  Service: Endoscopy;  Laterality: N/A;  . DEEP NECK LYMPH NODE BIOPSY / EXCISION    . JOINT REPLACEMENT    . KNEE ARTHROPLASTY Left 06/20/2015   Procedure: COMPUTER ASSISTED TOTAL KNEE ARTHROPLASTY;  Surgeon: Dereck Leep, MD;  Location: [facility];  Service: Orthopedics;  Laterality: Left;  . PTOSIS REPAIR Bilateral 04/14/2017   Procedure: PTOSIS REPAIR RESECT EX;  Surgeon: Karle Starch, MD;  Location: [place];  Service: Ophthalmology;  Laterality: Bilateral;  sleep apnea  . TONSILLECTOMY    . TOTAL HIP ARTHROPLASTY Right 07/21/2018   Procedure: TOTAL HIP ARTHROPLASTY ANTERIOR APPROACH;  Surgeon: Gaynelle Arabian, MD;  Location: [facility];  Service: Orthopedics;  Laterality: Right;   Family History  Problem Relation Age of Onset  . Heart attack Mother   . Dementia Father   . Aortic aneurysm Brother   . Colon cancer Neg Hx   . Breast cancer Neg Hx    Social History   Socioeconomic History  . Marital status: Widowed    Spouse name: Not on file  . Number of children: Not on file  . Years of education: Not on file  . Highest education level: Not on file  Occupational History  . Not on file  Tobacco Use  . Smoking status: Never Smoker  . Smokeless tobacco: Never  Used  Vaping Use  . Vaping Use: Never used  Substance and Sexual Activity  . Alcohol use: Yes    Comment: ocassional glass of wine 2-3 times a year  . Drug use: No  . Sexual activity: Never  Other Topics Concern  . 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 Difficulty of Paying Living Expenses: Not on file  Food Insecurity:   . Worried About Charity fundraiser in the Last Year: Not on file  . Ran Out of  Food in the Last Year: Not on file  Transportation Needs:   . Lack of Transportation (Medical): Not on file  . Lack of Transportation (Non-Medical): Not on file  Physical Activity:   . Days of Exercise per Week: Not on file  . Minutes of Exercise per Session: Not on file  Stress:   . Feeling of Stress : Not on file  Social Connections:   . Frequency of Communication with Friends and Family: Not on file  . Frequency of Social Gatherings with Friends and Family: Not on file  . Attends Religious Services: Not on file  . Active Member of Clubs or Organizations: Not on file  . Attends Archivist Meetings: Not on file  . Marital Status: Not on file   Allergies  Allergen Reactions  . Hydrocodone Nausea Only    Noted after surgery, may be able to tolerate with food    Medications  (Not in a hospital admission)    Vitals   Vitals:   04/18/20 1457 04/18/20 1500 04/18/20 1555 04/18/20 1600  BP: 127/70 127/70 120/79 140/76  Pulse: 72 72 76 79  Resp: 16 15 16 15   Temp:      TempSrc:      SpO2: 96% 96% 99% 99%  Weight:      Height:         Body mass index is 30.91 kg/m.  Physical Exam   General: Laying comfortably in bed; in no acute distress. HENT: Normal oropharynx and mucosa. Normal external appearance of ears and nose. Neck: Supple, no pain or tenderness CV: No JVD. No peripheral edema. Pulmonary: Symmetric Chest rise. Normal respiratory effort. Abdomen: Soft to touch, non-tender.  Ext: No cyanosis, edema, or deformity Skin: No rash. Normal palpation of skin.  Musculoskeletal: Normal digits and nails by inspection. No clubbing.  Neurologic Examination  Mental status/Cognition: Alert, oriented to self, place, month and year, good attention. Speech/language: Fluent, comprehension intact, object naming intact, repetition intact. Cranial nerves:   CN II Pupils equal and reactive to light, no VF deficits    CN III,IV,VI EOM intact, no gaze preference or  deviation, no nystagmus    CN V normal sensation in V1, V2, and V3 segments bilaterally    CN VII no asymmetry, no nasolabial fold flattening    CN VIII normal hearing to speech    CN IX & X normal palatal elevation, no uvular deviation    CN XI 5/5 head turn and 5/5 shoulder shrug bilaterally    CN XII midline tongue protrusion    Motor:  Muscle bulk: normal, tone normal, pronator drift none tremor none Mvmt Root Nerve  Muscle Right Left Comments  SA C5/6 Ax Deltoid 5  5   EF C5/6 Mc Biceps 5 5   EE C6/7/8 Rad Triceps 5 5   WF C6/7 Med FCR 5 5   WE C7/8 PIN ECU 5 5   F Ab C8/T1 U ADM/FDI 5 5   HF L1/2/3 Fem Illopsoas 4+ 5   KE L2/3/4 Fem Quad 4+ 5   DF L4/5 D Peron Tib Ant 5 5   PF S1/2 Tibial Grc/Sol 5 5    Reflexes:  Right Left Comments  Pectoralis      Biceps (C5/6) 2 2   Brachioradialis (C5/6) 2 2    Triceps (C6/7) 2 3    Patellar (L3/4) 2 2    Achilles (S1)      Hoffman      Plantar     Jaw jerk    Sensation:  Light touch Intact throughout   Pin prick    Temperature    Vibration   Proprioception    Coordination/Complex Motor:  - Finger to Nose intact BL - Heel to shin intact BL - Rapid alternating movement with some slowed movements in LUE.  Labs   CBC:  Recent Labs  Lab 04/18/20 0943 04/18/20 0955  WBC 7.3  --   NEUTROABS 5.7  --   HGB 15.9* 16.7*  HCT 48.7* 49.0*  MCV 95.1  --   PLT 293  --     Basic Metabolic Panel:  Lab Results  Component Value Date   NA 141 04/18/2020   K 4.1 04/18/2020   CO2 24 04/18/2020   GLUCOSE 108 (H) 04/18/2020   BUN 11 04/18/2020   CREATININE 0.80 04/18/2020   CALCIUM 10.2 04/18/2020   GFRNONAA 59 (L) 04/18/2020   GFRAA >60 02/18/2020   Lipid Panel:  Lab Results  Component Value Date   LDLCALC 54 02/17/2020   HgbA1c:  Lab Results  Component Value Date   HGBA1C 5.6 02/17/2020   Urine Drug Screen:     Component Value Date/Time   LABOPIA NONE DETECTED 07/11/2019 1100   COCAINSCRNUR NONE DETECTED  07/11/2019 1100   LABBENZ NONE DETECTED 07/11/2019 1100   AMPHETMU NONE DETECTED 07/11/2019 1100   THCU NONE DETECTED 07/11/2019 1100   LABBARB NONE DETECTED 07/11/2019 1100    Alcohol Level     Component Value Date/Time   ETH <10 07/11/2019 1100     Results for orders placed during the hospital encounter of 04/18/20  MR BRAIN WO CONTRAST  Narrative CLINICAL DATA:  Numbness of right side  EXAM: MRI HEAD WITHOUT CONTRAST  TECHNIQUE: Multiplanar, multiecho pulse sequences of the brain and surrounding structures were obtained without intravenous contrast.  COMPARISON:  02/16/2020  FINDINGS: Brain: There is patchy reduced diffusion involving the left superior frontal gyrus, cingulate gyrus, and body of the corpus callosum. There is also some involvement of the parasagittal left parietal lobe.  Left parietal focus susceptibility is again identified and probably reflects chronic microhemorrhage. Patchy and confluent areas of T2 hyperintensity in the supratentorial and pontine white matter are nonspecific but probably reflects stable moderate to advanced chronic microvascular ischemic changes. There is a chronic small vessel infarct of the right corona radiata.  There is no intracranial mass or mass effect. Ventricles are stable in size. No hydrocephalus or extra-axial collection.  Vascular: Major vessel flow voids at the skull base are preserved.  Skull and upper cervical spine: Normal marrow signal is preserved.  Sinuses/Orbits: Trace mucosal thick.  Bilateral lens replacement.  Other: Sella is unremarkable.  Mastoid air cells  are clear.  IMPRESSION: Acute left ACA territory infarcts.  No hemorrhage or mass effect.  Stable chronic findings detailed above including moderate to advanced chronic microvascular ischemic changes.   Electronically Signed By: Macy Mis M.D. On: 04/18/2020 15:41    Impression   [name] [name] is a 79 y.o. 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  Suspect that her symptoms are probably due to underling severe multifocal multivessel stenosis. She has had a holter monitor on for the last 2 weeks. Per discussion with ED provider, she reached out to cards for a preliminary read and she had a couple episodes of Vtach but no noted Afibb.  Recommendations  - I ordered MR Angio head without contrast - I ordered carotid duplex - I ordered Aspirin and plavix x 3 weeks, followed by Plavix alone. - continue telemetry - No need to repeat TTE as this was recently completed. - PT/OT evaluation. - LDL and HbA1c - continue Lipitor 80mg  daily. ______________________________________________________________________   Thank you for the opportunity to take part in the care of this patient. If you have any further questions, please contact the neurology consultation attending.  Signed,  [place] Pager Number [phone]

## 2020-04-18 NOTE — H&P (Signed)
History and Physical    [name] [name] IOE:[id number] DOB: 07-10-1940 DOA: 04/18/2020  PCP: Tonia Ghent, MD Consultants:  Rexene Alberts - neurology; Allred - cardiology; Leonie Man - neurology Patient coming from:  Home - lives alone; NOK: Daughter, [phone]  Chief Complaint: R-sided numbness  HPI: [name] [name] is a 79 y.o. female with medical history significant of OSA on CPAP; obesity (BMI 31); HTN; HLD; and CVA in 06/2019 and 01/2020 presenting with R arm numbness.  She has had a heart monitor to monitor for afib recently ([facility]), removed today just prior to MRI.  This time, she noticed difficulty moving herself in the bed for several nights - she couldn't really move herself despite a handle on the R side of her bed.  She woke up diaphoretic this AM.  Her daughter has been seeing changes since maybe last Friday - walking fine without the walker prior but became wobbly, "like she was in a cloud".  Dr. Rayann Heman saw her recently and she needed an external monitor for 30 days (through 12/9) and then will need loop recorder.   She felt like both legs were heavy - last stroke affected her left side and last one affected her right side.  RUE was new this week; LLE was old deficit and RLE appeared to be new.    ED Course: RUE and RLE numbness/tingling x 2-3 days.  L ACA CVA.  Neurology recommends ASA and will see her.  No symptoms currently, NIH 0.  Review of Systems: As per HPI; otherwise review of systems reviewed and negative.   Ambulatory Status:  Ambulated without assistance until last week - balance issues -> difficulty getting out of chair -> wobbly/weak  COVID Vaccine Status:  Complete  Past Medical History:  Diagnosis Date  . Arthritis    knees  . Depression   . Fatty liver   . GERD (gastroesophageal reflux disease)   . Hyperlipidemia   . Hypertension   . IBS (irritable bowel syndrome)   . Obesity   . Pneumonia   . Sleep apnea    uses CPAP  . Stroke (cerebrum) [facility])      Past Surgical History:  Procedure Laterality Date  . BLADDER SURGERY    . BREAST SURGERY     breast biopsy  . BROW LIFT Bilateral 04/14/2017   Procedure: BLEPHAROPLASTY UPPER EYELID WITH EXCESS SKIN;  Surgeon: Karle Starch, MD;  Location: [place];  Service: Ophthalmology;  Laterality: Bilateral;  . CATARACT EXTRACTION W/PHACO Left 02/05/2016   Procedure: CATARACT EXTRACTION PHACO AND INTRAOCULAR LENS PLACEMENT (IOC);  Surgeon: Birder Robson, MD;  Location: [facility];  Service: Ophthalmology;  Laterality: Left;  [place] 01:10 AP% 22.3 CDE 15.67 Fluid pack lot # [id number]  . CATARACT EXTRACTION W/PHACO Right 02/26/2016   Procedure: CATARACT EXTRACTION PHACO AND INTRAOCULAR LENS PLACEMENT (IOC);  Surgeon: Birder Robson, MD;  Location: [facility];  Service: Ophthalmology;  Laterality: Right;  [place] 57.4 AP% 24.0 CDE 13.75 Fluid Pack lot # [id number]  . CHOLECYSTECTOMY    . COLONOSCOPY WITH PROPOFOL N/A 12/18/2014   Procedure: COLONOSCOPY WITH PROPOFOL;  Surgeon: Manya Silvas, MD;  Location: [facility];  Service: Endoscopy;  Laterality: N/A;  . DEEP NECK LYMPH NODE BIOPSY / EXCISION    . JOINT REPLACEMENT    . KNEE ARTHROPLASTY Left 06/20/2015   Procedure: COMPUTER ASSISTED TOTAL KNEE ARTHROPLASTY;  Surgeon: Dereck Leep, MD;  Location: [facility];  Service: Orthopedics;  Laterality: Left;  . PTOSIS  REPAIR Bilateral 04/14/2017   Procedure: PTOSIS REPAIR RESECT EX;  Surgeon: Karle Starch, MD;  Location: [place];  Service: Ophthalmology;  Laterality: Bilateral;  sleep apnea  . TONSILLECTOMY    . TOTAL HIP ARTHROPLASTY Right 07/21/2018   Procedure: TOTAL HIP ARTHROPLASTY ANTERIOR APPROACH;  Surgeon: Gaynelle Arabian, MD;  Location: [facility];  Service: Orthopedics;  Laterality: Right;    Social History   Socioeconomic History  . Marital status: Widowed    Spouse name: Not on file  . Number of children: Not on file  . Years of education: Not on file  . Highest  education level: Not on file  Occupational History  . Occupation: retired  Tobacco Use  . Smoking status: Never Smoker  . Smokeless tobacco: Never Used  Vaping Use  . Vaping Use: Never used  Substance and Sexual Activity  . Alcohol use: Yes    Comment: ocassional glass of wine 2-3 times a year  . Drug use: No  . Sexual activity: Never  Other Topics Concern  . 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 Difficulty of Paying Living Expenses: Not on file  Food Insecurity:   . Worried About Charity fundraiser in the Last Year: Not on file  . Ran Out of Food in the Last Year: Not on file  Transportation Needs:   . Lack of Transportation (Medical): Not on file  . Lack of Transportation (Non-Medical): Not on file  Physical Activity:   . Days of Exercise per Week: Not on file  . Minutes of Exercise per Session: Not on file  Stress:   . Feeling of Stress : Not on file  Social Connections:   . Frequency of Communication with Friends and Family: Not on file  . Frequency of Social Gatherings with Friends and Family: Not on file  . Attends Religious Services: Not on file  . Active Member of Clubs or Organizations: Not on file  . Attends Archivist Meetings: Not on file  . Marital Status: Not on file  Intimate Partner Violence:   . Fear of Current or Ex-Partner: Not on file  . Emotionally Abused: Not on file  . Physically Abused: Not on file  . Sexually Abused: Not on file    Allergies  Allergen Reactions  . Hydrocodone Nausea Only    Noted after surgery, may be able to tolerate with food    Family History  Problem Relation Age of Onset  . Heart attack Mother   . Dementia Father   . Heart attack Father 59  . Aortic aneurysm Brother   . Stroke Paternal Grandfather   .  Colon cancer Neg Hx   . Breast cancer Neg Hx     Prior to Admission medications   Medication Sig Start Date End Date Taking? Authorizing Provider  acetaminophen (TYLENOL) 500 MG tablet Take 500 mg by mouth every 6 (six) hours as needed for moderate pain.    Yes [provider]  atenolol (TENORMIN) 50 MG tablet TAKE ONE TABLET TWICE DAILY Patient taking differently: Take 50 mg by mouth 2 (two) times daily.  12/26/19  Yes Tonia Ghent, MD  atorvastatin (LIPITOR) 80 MG tablet TAKE ONE TABLET BY MOUTH  EVERY DAY AT 6PM Patient taking differently: Take 80 mg by mouth every evening.  01/09/20  Yes Tonia Ghent, MD  Biotin 10000 MCG TABS Take 5,000 mcg by mouth daily.    Yes [provider]  chlorpheniramine (CHLOR-TRIMETON) 4 MG tablet Take 4 mg by mouth daily as needed for allergies.   Yes [provider]  cholecalciferol (VITAMIN D) 1000 UNITS tablet Take 1,000 Units by mouth daily.    Yes [provider]  clopidogrel (PLAVIX) 75 MG tablet Take 1 tablet (75 mg total) by mouth daily. 03/01/20  Yes McCue, Janett Billow, NP  hydrOXYzine (ATARAX/VISTARIL) 10 MG tablet TAKE 1/2-1 TABLET BY MOUTH 3 TIMES DAILYAS NEEDED FOR ANXIETY (SEDATION CAUTION) Patient taking differently: Take 5-10 mg by mouth 3 (three) times daily as needed (ANXIETY (SEDATION CAUTION)).  03/12/20  Yes Tonia Ghent, MD  ipratropium (ATROVENT) 0.06 % nasal spray one spray per nostril twice daily (up to three times daily) to help with runny nose Patient taking differently: Place 1 spray into both nostrils in the morning.  03/28/20  Yes Valentina Shaggy, MD  magnesium oxide (MAG-OX) 400 MG tablet Take 500 mg by mouth every other day.    Yes [provider]  montelukast (SINGULAIR) 10 MG tablet Take 1 tablet (10 mg total) by mouth at bedtime. 03/28/20  Yes Valentina Shaggy, MD  fluticasone [facility]) 50 MCG/ACT nasal spray Place 2 sprays into both nostrils daily as needed for allergies  or rhinitis. Patient not taking: Reported on 04/18/2020    [provider]  senna-docusate (SENOKOT-S) 8.6-50 MG tablet Take 1 tablet by mouth at bedtime as needed for mild constipation. Patient not taking: Reported on 04/18/2020 02/18/20   Kerney Elbe, DO    Physical Exam: Vitals:   04/18/20 1615 04/18/20 1630 04/18/20 1645 04/18/20 1707  BP: 138/77 135/83 139/70 130/88  Pulse: 72 78 79 77  Resp: 13 16 17 19   Temp:      TempSrc:      SpO2: 98% 99% 97% 98%  Weight:      Height:         . General:  Appears calm and comfortable and is NAD . Eyes:  PERRL, EOMI, normal lids, iris . ENT:  grossly normal hearing, lips & tongue, mmm; appropriate dentition . Neck:  no LAD, masses or thyromegaly; no carotid bruits . Cardiovascular:  RRR, no m/r/g. No LE edema.  [name] Kitchen Respiratory:   CTA bilaterally with no wheezes/rales/rhonchi.  Normal respiratory effort. . Abdomen:  soft, NT, ND, NABS . Skin:  no rash or induration seen on limited exam . Musculoskeletal:  Mild L shoulder weakness, mild B LE weakness, good ROM, no bony abnormality . Psychiatric:  grossly normal mood and affect, speech fluent and appropriate, AOx3 . Neurologic:  CN 2-12 grossly intact other than mild L facial droop, moves all extremities in coordinated fashion, sensation intact     Radiological Exams on Admission: Independently reviewed - see discussion in A/P where applicable  CT HEAD WO CONTRAST  Result Date: 04/18/2020 CLINICAL DATA:  Numbness. EXAM: CT HEAD WITHOUT CONTRAST TECHNIQUE: Contiguous axial images were obtained from the base of the skull through the vertex without intravenous contrast. COMPARISON:  February 16, 2020. FINDINGS: Brain: Mild chronic ischemic white matter disease is noted. 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 No mass effect or midline shift is noted. Ventricular size is within normal limits. There is no evidence of mass lesion, hemorrhage or acute  infarction. Vascular: No hyperdense vessel or unexpected calcification. Skull: Normal. Negative for fracture or focal lesion. Sinuses/Orbits: No acute finding. Other: None. IMPRESSION: Mild chronic ischemic white matter disease. 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 No acute intracranial abnormality seen. Electronically Signed   By: Marijo Conception M.D.   On: 04/18/2020 13:45   MR BRAIN WO CONTRAST  Result Date: 04/18/2020 CLINICAL DATA:  Numbness of right side EXAM: MRI HEAD WITHOUT CONTRAST TECHNIQUE: Multiplanar, multiecho pulse sequences of the brain and surrounding structures were obtained without intravenous contrast. COMPARISON:  02/16/2020 FINDINGS: Brain: There is patchy reduced diffusion involving the left superior frontal gyrus, cingulate gyrus, and body of the corpus callosum. There is also some involvement of the parasagittal left parietal lobe. Left parietal focus susceptibility is again identified and probably reflects chronic microhemorrhage. Patchy and confluent areas of T2 hyperintensity in the supratentorial and pontine white matter are nonspecific but probably reflects stable moderate to advanced chronic microvascular ischemic changes. There is a chronic small vessel infarct of the right corona radiata. There is no intracranial mass or mass effect. Ventricles are stable in size. No hydrocephalus or extra-axial collection. Vascular: Major vessel flow voids at the skull base are preserved. Skull and upper cervical spine: Normal marrow signal is preserved. Sinuses/Orbits: Trace mucosal thick.  Bilateral lens replacement. Other: Sella is unremarkable.  Mastoid air cells are clear. IMPRESSION: Acute left ACA territory infarcts.  No hemorrhage or mass effect. Stable chronic findings detailed above including moderate to advanced chronic microvascular ischemic changes. Electronically Signed   By: Macy Mis M.D.   On: 04/18/2020 15:41    EKG: Independently reviewed.  NSR with  rate 70 with occasional PVCs; nonspecific ST changes with no evidence of acute ischemia   Labs on Admission: I have personally reviewed the available labs and imaging studies at the time of the admission.  Pertinent labs:   Glucose 114 BUN 10/Creatinine 0.98/GFR 59 Unremarkable CBC INR 1.0 UA: small Hgb, 5 ketones   Assessment/Plan Principal Problem:   Acute CVA (cerebrovascular accident) [facility]) Active Problems:   HYPERCHOLESTEROLEMIA   Essential hypertension   Sleep apnea   Class 1 obesity due to excess calories with body mass index (BMI) of 30.0 to 30.9 in adult   Acute CVA -Patient now with 3 recurrent strokes - February, September, and now November 2021 -Mild left-sided residual weakness from initial CVA, none from most recent -This patient was not deemed to be a candidate for tPA therapy due to mild symptoms and length of time from onset of symptoms -Aspirin has been given to reduce stroke mortality and decrease morbidity; Plavix has also been continued -Will admit for further CVA evaluation -Telemetry monitoring -MRI confirms acute left ACA territory infarcts with stable moderate to advanced chronic microvascular ischemic changes; needs MRA but likely to need medical management without procedural intervention -Carotid dopplers; if ipsilateral carotid stenosis is detected then prompt vascular surgery consultation is needed for consideration of CEA.  There was no stenosis in 2014 but these do not appear to have been ordered since (had CTA head/neck in February) -Echo completed with both prior events but bubble study could be considered -Vasculitis evaluation could be considered -Patient does not have known afib but this is also a consideration; she has an external monitor conveniently in place (currently removed during hospitalization) and preliminary data was reviewed today and sent to Dr. Rayann Heman - 2 runs of 4 beats of vtach without evidence of afib.  After this, loop recorder  may  be beneficial.  Without evidence of afib, there is no indication for [facility] at this time. -Since this patient has failed prevention with both ASA and Plavix, DAPT vs. Brilinta vs. Aggrenox vs. Effient could be considered.  Will defer to neurology for now. -Consider thrombectomy if there is persistent disabling neurologic deficit associated with a vascular cut-off -Neurology consult -PT/OT/ST/Nutrition Consults  EssentialHypertension -Allow permissive HTN for now -Treat BP only if >220/120, and then with goal of 15% reduction -Hold Atenolol and plan to restart in 48-72 hours -Thiazide diuretics are recommended as first-line therapeutic agents vs. ACE/ARB for diabetic patients  HLD -Continue Lipitor 80mg  -Recheck lipids  OSA (obstructive sleep apnea) -Continue CPAP  Obesity -Body mass index is 30.91 kg/m.  This is improved from prior hospitalization. -Weight loss should be encouraged -Outpatient PCP/bariatric medicine f/u encouraged      Note: This patient has been tested and is pending for the novel coronavirus COVID-19. She has been fully vaccinated against COVID-19.    DVT prophylaxis:  Lovenox  Code Status: Full - confirmed with patient/family Family Communication: Daughter present throughout evaluation Disposition Plan:  The patient is from: home  Anticipated d/c is to: home without [facility] services   Anticipated d/c date will depend on clinical response to treatment, but possibly as early as tomorrow if she has excellent response to treatment  Patient is currently: acutely ill Consults called: Neurology; PT/OT/ST/Nutrition Admission status: Admit - It is my clinical opinion that admission to INPATIENT is reasonable and necessary because of the expectation that this patient will require hospital care that crosses at least 2 midnights to treat this condition based on the medical complexity of the problems presented.  Given the aforementioned information, the predictability  of an adverse outcome is felt to be significant.   Karmen Bongo MD [facility]   How to contact the [facility] Attending or Consulting provider [place] or covering provider during after hours [place], for this patient?  1. Check the care team in [facility] and look for a) attending/consulting [facility] provider listed and b) the [facility] team listed 2. Log into [email or web address] and use [place]'s universal password to access. If you do not have the password, please contact the hospital operator. 3. Locate the [facility] provider you are looking for under [facility] and page to a number that you can be directly reached. 4. If you still have difficulty reaching the provider, please page the [facility] (Director on Call) for the Hospitalists listed on amion for assistance.   04/18/2020, 5:36 PM

## 2020-04-18 NOTE — Discharge Instructions (Signed)
As discussed, your CT head and MRI showed no signs of new stroke. Please call your neurologist on Monday to schedule an appointment for further evaluation. Return to the ER for new or worsening symptoms.

## 2020-04-19 ENCOUNTER — Encounter (HOSPITAL_COMMUNITY): Payer: Medicare HMO

## 2020-04-19 DIAGNOSIS — I639 Cerebral infarction, unspecified: Secondary | ICD-10-CM | POA: Diagnosis not present

## 2020-04-19 DIAGNOSIS — I63522 Cerebral infarction due to unspecified occlusion or stenosis of left anterior cerebral artery: Secondary | ICD-10-CM | POA: Diagnosis not present

## 2020-04-19 LAB — URINE CULTURE: Culture: NO GROWTH

## 2020-04-19 LAB — LIPID PANEL
Cholesterol: 95 mg/dL (ref 0–200)
HDL: 26 mg/dL — ABNORMAL LOW (ref >40)
LDL Cholesterol: 48 mg/dL (ref 0–99)
Total CHOL/HDL Ratio: 3.7 RATIO
Triglycerides: 103 mg/dL (ref <150)
VLDL: 21 mg/dL (ref 0–40)

## 2020-04-19 LAB — TSH: TSH: 1.235 u[IU]/mL (ref 0.350–4.500)

## 2020-04-19 LAB — PLATELET INHIBITION P2Y12: Platelet Function  P2Y12: 4 [PRU] — ABNORMAL LOW (ref 182–335)

## 2020-04-19 LAB — HEMOGLOBIN A1C
Hgb A1c MFr Bld: 5.3 % (ref 4.8–5.6)
Mean Plasma Glucose: 105.41 mg/dL

## 2020-04-19 MED ORDER — TICAGRELOR 90 MG PO TABS
180.0000 mg | ORAL_TABLET | Freq: Once | ORAL | Status: AC
  Administered 2020-04-19: 180 mg via ORAL
  Filled 2020-04-19: qty 2

## 2020-04-19 MED ORDER — DM-GUAIFENESIN ER 30-600 MG PO TB12: 1.0000 | ORAL_TABLET | Freq: Two times a day (BID) | ORAL | Status: DC | PRN

## 2020-04-19 MED ORDER — TICAGRELOR 90 MG PO TABS
90.0000 mg | ORAL_TABLET | Freq: Two times a day (BID) | ORAL | Status: DC
  Administered 2020-04-20 – 2020-04-23 (×7): 90 mg via ORAL
  Filled 2020-04-19 (×7): qty 1

## 2020-04-19 NOTE — Evaluation (Signed)
Physical Therapy Evaluation Patient Details Name: [name] [name] MRN: [id number] DOB: 1940/08/10 Today's Date: 04/19/2020   History of Present Illness  Pt is a 79 year old female who presented with intermittent R arm and leg numbness/tingling and weakness that have lasted 2-3 days. Her daughter also reported the pt has been "out of it" and not herself the past few days. 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 She was not administered tPA. NIHSS = 0. 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 PMH: CVAs, sleep apnea, pneumonia, obesity, IBS, HTN, depression, and arthritis.  Clinical Impression  Evaluated pt in conjunction with OT to maximize safety and quality of session/evaluation with mobility. Pt presents with the condition mentioned above and a hx of 2 CVAs earlier this year prior to this 3rd stroke. She has residual L-sided weakness from one of her strokes. She demonstrates deficits in leg muscular strength, coordination, and dynamic proprioception that result in impaired balance and safety with mobility. She requires minA to come to stand and to ambulate with a RW safely. Her TUG test time of 30.99 seconds further supports that the pt is at a high risk for falls and injury. Secondary to the pt having multiple strokes this year and being at high risk for falls and normally lives alone (but can get assistance from family if needed), recommending CIR upon d/c to address her deficits and maximize her independence and safety with all functional mobility. Will continue to follow acutely.     Follow Up Recommendations CIR;Supervision for mobility/OOB (if not CIR then recommend [facility] PT)    Equipment Recommendations  None recommended by PT    Recommendations for Other Services Rehab consult     Precautions /  Restrictions Precautions Precautions: Fall Restrictions Weight Bearing Restrictions: No      Mobility  Bed Mobility Overal bed mobility: Needs Assistance Bed Mobility: Supine to Sit     Supine to sit: Min guard;HOB elevated     General bed mobility comments: Pt required use of bed rails with HOB elevated to advance legs off EOB and ascend trunk. Min guard assist for safety.    Transfers Overall transfer level: Needs assistance Equipment used: Rolling walker (2 wheeled) Transfers: Sit to/from Stand Sit to Stand: Min assist         General transfer comment: MinA for steadying with power up to stand. Extra time and cues provided for hand placement as pt tends to try to pull up on RW.  Ambulation/Gait Ambulation/Gait assistance: Min assist Gait Distance (Feet): 150 Feet Assistive device: Rolling walker (2 wheeled) Gait Pattern/deviations: Step-through pattern;Decreased stride length;Narrow base of support;Trunk flexed Gait velocity: decreased Gait velocity interpretation: <1.31 ft/sec, indicative of household ambulator General Gait Details: Pt ambulates with R leg externally rotated, L leg internally rotated, and excessively narrow BOS. Provided verbal and visual cues to improve stance width, with min success. Pt displayed LOB resulting in minA to recover. Displays intermittent shaking at knees with gait.  Stairs            Wheelchair Mobility    Modified Rankin (Stroke Patients Only) Modified Rankin (Stroke Patients Only) Pre-Morbid Rankin Score: No significant disability Modified Rankin: Moderately severe disability     Balance Overall balance assessment: Needs assistance Sitting-balance support: No upper extremity supported;Feet  supported Sitting balance-Leahy Scale: Good Sitting balance - Comments: Sitting EOB no LOB, supervision for safety.   Standing balance support: Bilateral upper extremity supported;During functional activity Standing balance-Leahy  Scale: Poor Standing balance comment: Reliant on UE support for balance.                 Standardized Balance Assessment Standardized Balance Assessment : TUG: Timed Up and Go Test     Timed Up and Go Test TUG: Normal TUG Normal TUG (seconds): 30.99     Pertinent Vitals/Pain Pain Assessment: No/denies pain    Home Living Family/patient expects to be discharged to:: Private residence Living Arrangements: Alone Available Help at Discharge: Family;Available 24 hours/day Type of Home: Other(Comment) (condo) Home Access: Level entry     Home Layout: One level Home Equipment: Walker - 2 wheels;Bedside commode;Wheelchair - manual;Grab bars - toilet;Grab bars - tub/shower;Shower seat;Walker - 4 wheels      Prior Function Level of Independence: Independent with assistive device(s)         Comments: Utilizes rollator for mobility as needed. On good days goes without device     Hand Dominance   Dominant Hand: Right    Extremity/Trunk Assessment   Upper Extremity Assessment Upper Extremity Assessment: Defer to OT evaluation    Lower Extremity Assessment Lower Extremity Assessment: RLE deficits/detail;LLE deficits/detail RLE Deficits / Details: MMT scores of grossly 4 to 4+ RLE Sensation: decreased proprioception (dynamic prop = delayed in ankle, intact in knee) RLE Coordination: WNL LLE Deficits / Details: MMT scores of grossly 4- to 4+ LLE Sensation: decreased proprioception (dynamic prop = delayed in ankle, intact in knee) LLE Coordination: decreased fine motor (possible dysdiadochokinesia noted)    Cervical / Trunk Assessment Cervical / Trunk Assessment: Normal  Communication   Communication: No difficulties  Cognition Arousal/Alertness: Awake/alert Behavior During Therapy: WFL for tasks assessed/performed Overall Cognitive Status: Impaired/Different from baseline Area of Impairment: Safety/judgement;Awareness;Problem solving                          Safety/Judgement: Decreased awareness of safety;Decreased awareness of deficits Awareness: Emergent Problem Solving: Difficulty sequencing;Requires verbal cues General Comments: Pt displays mild safety awareness deficits in that she would reach distally off BOS to reach door knob and lose balance rather than take a step closer to reach the knob. She requires cues to correct and sequence tasks to maintain her safety.      General Comments      Exercises     Assessment/Plan    PT Assessment Patient needs continued PT services  PT Problem List Decreased strength;Decreased activity tolerance;Decreased balance;Decreased mobility;Decreased coordination;Decreased cognition;Decreased knowledge of use of DME;Decreased safety awareness       PT Treatment Interventions DME instruction;Gait training;Stair training;Functional mobility training;Therapeutic activities;Therapeutic exercise;Balance training;Neuromuscular re-education;Cognitive remediation;Patient/family education    PT Goals (Current goals can be found in the Care Plan section)  Acute Rehab PT Goals Patient Stated Goal: to return to prior level of function and go home PT Goal Formulation: With patient/family Time For Goal Achievement: 05/03/20 Potential to Achieve Goals: Good    Frequency Min 4X/week   Barriers to discharge        Co-evaluation PT/OT/SLP Co-Evaluation/Treatment: Yes Reason for Co-Treatment: For patient/therapist safety;To address functional/ADL transfers PT goals addressed during session: Mobility/safety with mobility;Balance;Proper use of DME         AM-PAC PT "6 Clicks" Mobility  Outcome Measure Help needed turning from your back to your side while  in a flat bed without using bedrails?: A Little Help needed moving from lying on your back to sitting on the side of a flat bed without using bedrails?: A Little Help needed moving to and from a bed to a chair (including a wheelchair)?: A  Little Help needed standing up from a chair using your arms (e.g., wheelchair or bedside chair)?: A Little Help needed to walk in hospital room?: A Little Help needed climbing 3-5 steps with a railing? : A Lot 6 Click Score: 17    End of Session Equipment Utilized During Treatment: Gait belt Activity Tolerance: Patient tolerated treatment well Patient left: in chair;with call bell/phone within reach;with chair alarm set;with family/visitor present (daughter present) Nurse Communication: Mobility status PT Visit Diagnosis: Unsteadiness on feet (R26.81);Other abnormalities of gait and mobility (R26.89);Muscle weakness (generalized) (M62.81);History of falling (Z91.81);Difficulty in walking, not elsewhere classified (R26.2);Other symptoms and signs involving the nervous system (R29.898);Hemiplegia and hemiparesis 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

## 2020-04-19 NOTE — Progress Notes (Signed)
PROGRESS NOTE    MELODY CIRRINCIONE  BPZ:[id number] DOB: 04-Sep-1940 DOA: 04/18/2020 PCP: Tonia Ghent, MD   Brief Narrative:  79 year old with history of obstructive sleep apnea on CPAP, HTN, HLD, CVA in 2021 February and September presenting with right-sided arm numbness.  MRI brain confirmed acute left ACA territory infarcts.  Follows outpatient cardiology for A. fib.   Assessment & Plan:   Principal Problem:   Acute CVA (cerebrovascular accident) ([facility]) Active Problems:   HYPERCHOLESTEROLEMIA   Essential hypertension   Sleep apnea   Class 1 obesity due to excess calories with body mass index (BMI) of 30.0 to 30.9 in adult   Acute left ACA territory infarct -Stroke protocol -Permissive hypertension at this point  -MRI brain positive for left ACA CVA -MRA head without contrast-severe stenosis of left anterior circulating area, moderate stenosis of P2 P.  Defer the need for vascular evaluation to neurology -Currently on aspirin and Plavix, but I suspect she might need to be on full dose anticoagulation -Will likely need long-term loop recorder -No repeat echocardiogram needed, done recently -LDL 48 -Frequent neuro checks -Lipitor 40 mg daily -Risk factor modification -Neurology following -PT/OT eval, Speech consult   Essential hypertension -Permissive hypertension  Hyperlipidemia -Lipitor daily  Obstructive sleep apnea -CPAP  Obesity with BMI greater than 25 -Weight loss diet and exercise  DVT prophylaxis: Lovenox Code Status: Full code Family Communication:    Status is: Inpatient  Remains inpatient appropriate because:Inpatient level of care appropriate due to severity of illness   Dispo: The patient is from: Home              Anticipated d/c is to: To be determined              Anticipated d/c date is: 1 day              Patient currently is not medically stable to d/c.       Body mass index is 30.91 kg/m.     Subjective: She is very  pleasant eating her breakfast this morning, no complaints besides her weakness from the CVA.  She is very curious to see what the next steps of interventions are by neurology.   Examination: Constitutional: Not in acute distress Respiratory: Clear to auscultation bilaterally Cardiovascular: Normal sinus rhythm, no rubs Abdomen: Nontender nondistended good bowel sounds Musculoskeletal: No edema noted Skin: No rashes seen Neurologic: CN 2-12 grossly intact.  Right upper and lower extremity leg weakness 4/5. Psychiatric: Normal judgment and insight. Alert and oriented x 3. Normal mood.   Objective: Vitals:   04/18/20 1812 04/18/20 1952 04/19/20 0011 04/19/20 0437  BP: 128/77 (!) 103/32 112/63 (!) 118/59  Pulse: 70 81 75   Resp: 12 14 16    Temp:  98.2 F (36.8 C) 98.4 F (36.9 C) 97.6 F (36.4 C)  TempSrc:  Oral Oral Oral  SpO2: 98% 96% 94%   Weight:      Height:       No intake or output data in the 24 hours ending 04/19/20 0841 Filed Weights   04/18/20 0941  Weight: 76.7 kg     Data Reviewed:   CBC: Recent Labs  Lab 04/18/20 0943 04/18/20 0955  WBC 7.3  --   NEUTROABS 5.7  --   HGB 15.9* 16.7*  HCT 48.7* 49.0*  MCV 95.1  --   PLT 293  --    Basic Metabolic Panel: Recent Labs  Lab 04/18/20 0943 04/18/20 0955  NA  140 141  K 3.9 4.1  CL 105 104  CO2 24  --   GLUCOSE 114* 108*  BUN 10 11  CREATININE 0.98 0.80  CALCIUM 10.2  --    GFR: Estimated Creatinine Clearance: 56.4 mL/min (by C-G formula based on SCr of 0.8 mg/dL). Liver Function Tests: Recent Labs  Lab 04/18/20 0943  AST 30  ALT 21  ALKPHOS 104  BILITOT 1.2  PROT 6.7  ALBUMIN 4.3   Recent Labs  Lab 04/18/20 1124  LIPASE 39   No results for input(s): AMMONIA in the last 168 hours. Coagulation Profile: Recent Labs  Lab 04/18/20 0943  INR 1.0   Cardiac Enzymes: No results for input(s): CKTOTAL, CKMB, CKMBINDEX, TROPONINI in the last 168 hours. BNP (last 3 results) No results  for input(s): PROBNP in the last 8760 hours. HbA1C: No results for input(s): HGBA1C in the last 72 hours. CBG: Recent Labs  Lab 04/18/20 0937  GLUCAP 113*   Lipid Profile: Recent Labs    04/19/20 0253  CHOL 95  HDL 26*  LDLCALC 48  TRIG 103  CHOLHDL 3.7   Thyroid Function Tests: No results for input(s): TSH, T4TOTAL, FREET4, T3FREE, THYROIDAB in the last 72 hours. Anemia Panel: No results for input(s): VITAMINB12, FOLATE, FERRITIN, TIBC, IRON, RETICCTPCT in the last 72 hours. Sepsis Labs: No results for input(s): PROCALCITON, LATICACIDVEN in the last 168 hours.  Recent Results (from the past 240 hour(s))  Resp Panel by RT-PCR (Flu A&B, Covid) Nasopharyngeal Swab     Status: None   Collection Time: 04/18/20  5:09 PM   Specimen: Nasopharyngeal Swab; Nasopharyngeal(NP) swabs in vial transport medium  Result Value Ref Range Status   SARS Coronavirus 2 by RT PCR NEGATIVE NEGATIVE Final    Comment: (NOTE) SARS-CoV-2 target nucleic acids are NOT DETECTED.  The SARS-CoV-2 RNA is generally detectable in upper respiratory specimens during the acute phase of infection. The lowest concentration of SARS-CoV-2 viral copies this assay can detect is 138 copies/mL. A negative result does not preclude SARS-Cov-2 infection and should not be used as the sole basis for treatment or other patient management decisions. A negative result may occur with  improper specimen collection/handling, submission of specimen other than nasopharyngeal swab, presence of viral mutation(s) within the areas targeted by this assay, and inadequate number of viral copies(<138 copies/mL). A negative result must be combined with clinical observations, patient history, and epidemiological information. The expected result is Negative.  Fact Sheet for Patients:  EntrepreneurPulse.com.au  Fact Sheet for Healthcare Providers:  IncredibleEmployment.be  This test is no t yet  approved or cleared by the [place] FDA and  has been authorized for detection and/or diagnosis of SARS-CoV-2 by FDA under an Emergency Use Authorization (EUA). This EUA will remain  in effect (meaning this test can be used) for the duration of the COVID-19 declaration under Section 564(b)(1) of the Act, 21 U.S.C.section 360bbb-3(b)(1), unless the authorization is terminated  or revoked sooner.       Influenza A by PCR NEGATIVE NEGATIVE Final   Influenza B by PCR NEGATIVE NEGATIVE Final    Comment: (NOTE) The Xpert Xpress SARS-CoV-2/FLU/RSV plus assay is intended as an aid in the diagnosis of influenza from Nasopharyngeal swab specimens and should not be used as a sole basis for treatment. Nasal washings and aspirates are unacceptable for Xpert Xpress SARS-CoV-2/FLU/RSV testing.  Fact Sheet for Patients: EntrepreneurPulse.com.au  Fact Sheet for Healthcare Providers: IncredibleEmployment.be  This test is not yet approved or cleared  by the [place] and has been authorized for detection and/or diagnosis of SARS-CoV-2 by FDA under an Emergency Use Authorization (EUA). This EUA will remain in effect (meaning this test can be used) for the duration of the COVID-19 declaration under Section 564(b)(1) of the Act, 21 U.S.C. section 360bbb-3(b)(1), unless the authorization is terminated or revoked.  Performed at [facility], [place] [address], [place], [place] [address]          Radiology Studies: CT HEAD WO CONTRAST  Result Date: 04/18/2020 CLINICAL DATA:  Numbness. EXAM: CT HEAD WITHOUT CONTRAST TECHNIQUE: Contiguous axial images were obtained from the base of the skull through the vertex without intravenous contrast. COMPARISON:  February 16, 2020. FINDINGS: Brain: Mild chronic ischemic white matter disease is noted. 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 No mass effect or midline shift is noted.  Ventricular size is within normal limits. There is no evidence of mass lesion, hemorrhage or acute infarction. Vascular: No hyperdense vessel or unexpected calcification. Skull: Normal. Negative for fracture or focal lesion. Sinuses/Orbits: No acute finding. Other: None. IMPRESSION: Mild chronic ischemic white matter disease. 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 No acute intracranial abnormality seen. Electronically Signed   By: Marijo Conception M.D.   On: 04/18/2020 13:45   MR ANGIO HEAD WO CONTRAST  Result Date: 04/18/2020 CLINICAL DATA:  Neuro deficit, acute stroke suspected. EXAM: MRA HEAD WITHOUT CONTRAST TECHNIQUE: Angiographic images of the Circle of Willis were obtained using MRA technique without intravenous contrast. COMPARISON:  MRA or of the brain February 16, 2020. FINDINGS: The bilateral vertebral arteries and the basilar artery have normal flow related enhancement. Luminal irregularities are noted along the posterior cerebral arteries with moderate stenosis at the P2P and P3 segments on the left. Luminal irregularities along the bilateral carotid siphons with moderate stenosis at the paraclinoid segment. Attenuation of the flow related enhancement at the left A2-A3 junction consistent with severe stenosis. The bilateral MCA and and right ACA vascular tree have normal flow related enhancement. The degree of stenosis of the left ACA has progressed from prior MRA. IMPRESSION: 1. 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 2. Moderate stenosis at the P2P and P3 segments on the left, and moderate stenosis at the paraclinoid segment of the bilateral ICA. Electronically Signed   By: Pedro Earls M.D.   On: 04/18/2020 19:19   MR BRAIN WO CONTRAST  Result Date: 04/18/2020 CLINICAL DATA:  Numbness of right side EXAM: MRI HEAD WITHOUT CONTRAST TECHNIQUE: Multiplanar, multiecho pulse sequences of the brain and surrounding structures were obtained  without intravenous contrast. COMPARISON:  02/16/2020 FINDINGS: Brain: There is patchy reduced diffusion involving the left superior frontal gyrus, cingulate gyrus, and body of the corpus callosum. There is also some involvement of the parasagittal left parietal lobe. Left parietal focus susceptibility is again identified and probably reflects chronic microhemorrhage. Patchy and confluent areas of T2 hyperintensity in the supratentorial and pontine white matter are nonspecific but probably reflects stable moderate to advanced chronic microvascular ischemic changes. There is a chronic small vessel infarct of the right corona radiata. There is no intracranial mass or mass effect. Ventricles are stable in size. No hydrocephalus or extra-axial collection. Vascular: Major vessel flow voids at the skull base are preserved. Skull and upper cervical spine: Normal marrow signal is preserved. Sinuses/Orbits: Trace mucosal thick.  Bilateral lens replacement. Other: Sella is unremarkable.  Mastoid air cells are clear. IMPRESSION: Acute left ACA  territory infarcts.  No hemorrhage or mass effect. Stable chronic findings detailed above including moderate to advanced chronic microvascular ischemic changes. Electronically Signed   By: Macy Mis M.D.   On: 04/18/2020 15:41        Scheduled Meds: . aspirin  300 mg Rectal Daily   Or  . aspirin  325 mg Oral Daily  . atorvastatin  80 mg Oral QPM  . clopidogrel  75 mg Oral Daily  . enoxaparin (LOVENOX) injection  40 mg Subcutaneous Q24H  . ipratropium  1 spray Each Nare BID  . montelukast  10 mg Oral QHS   Continuous Infusions: . sodium chloride 50 mL/hr at 04/18/20 1759     LOS: 1 day   Time spent= 35 mins    Aundray Cartlidge Arsenio Loader, MD [facility]  If 7PM-7AM, please contact night-coverage  04/19/2020, 8:41 AM

## 2020-04-19 NOTE — Evaluation (Signed)
Occupational Therapy Evaluation Patient Details Name: [name] [name] MRN: [id number] DOB: 1940-07-29 Today's Date: 04/19/2020    History of Present Illness Pt is a 79 year old female who presented with intermittent R arm and leg numbness/tingling and weakness that have lasted 2-3 days. Her daughter also reported the pt has been "out of it" and not herself the past few days. 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 She was not administered tPA. NIHSS = 0. 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 PMH: CVAs, sleep apnea, pneumonia, obesity, IBS, HTN, depression, and arthritis.   Clinical Impression   PTA pt living alone and functioning at mod I level. Family is nearby and checks in on pt often. She has a history of having previous CVAs. At time of eval, pt able to complete bed mobility at min guard level and sit <> stands with min A and RW for steadying support. During mobility, pt requires close guarding for safety. She reports she feels as if she cannot keep her balance (suspect sensory deficits in LEs). Pt reports sensory changes in R hand, feeling as if "it is not her hand". Noted mild R visual field deficit with testing (will continue to assess for pt had difficulty following the instruction). Pt with cognitive deficits in attention, safety, awareness, and problem solving. At this time due to increased fall risk and decreased level of independence, recommend CIR at d/c for further intensive therapies. Will continue to follow per POC listed below.     Follow Up Recommendations  CIR    Equipment Recommendations  None recommended by OT    Recommendations for Other Services Rehab consult     Precautions / Restrictions Precautions Precautions: Fall Restrictions Weight Bearing Restrictions: No       Mobility Bed Mobility Overal bed mobility: Needs Assistance Bed Mobility: Supine to Sit     Supine to sit: Min guard;HOB elevated     General bed mobility comments: Pt required use of bed rails with HOB elevated to advance legs off EOB and ascend trunk. Min guard assist for safety.    Transfers Overall transfer level: Needs assistance Equipment used: Rolling walker (2 wheeled) Transfers: Sit to/from Stand Sit to Stand: Min assist         General transfer comment: MinA for steadying with power up to stand. Extra time and cues provided for hand placement as pt tends to try to pull up on RW.    Balance Overall balance assessment: Needs assistance Sitting-balance support: No upper extremity supported;Feet supported Sitting balance-Leahy Scale: Good Sitting balance - Comments: Sitting EOB no LOB, supervision for safety.   Standing balance support: Bilateral upper extremity supported;During functional activity Standing balance-Leahy Scale: Poor Standing balance comment: Reliant on UE support for balance.                        ADL either performed or assessed with clinical judgement   ADL Overall ADL's : Needs assistance/impaired Eating/Feeding: Set up;Sitting   Grooming: Set up;Sitting   Upper Body Bathing: Minimal assistance;Sitting   Lower Body Bathing: Minimal assistance;Sit to/from stand;Sitting/lateral leans   Upper Body Dressing : Minimal assistance;Sitting   Lower Body Dressing: Moderate assistance;Sit to/from stand;Sitting/lateral leans Lower Body Dressing Details (indicate cue type and reason): to  don panties with pad. Pt with difficulty threading panties over bil legs. Then required steadying assist when pulling them up. Toilet Transfer: Minimal assistance;RW;Ambulation;Cueing for safety;Cueing for sequencing   Toileting- Clothing Manipulation and Hygiene: Set up;Sitting/lateral lean;Sit to/from stand       Functional mobility during ADLs: Min  guard;Rolling walker;Cueing for sequencing;Cueing for safety       Vision Baseline Vision/History: No visual deficits Patient Visual Report: No change from baseline Vision Assessment?: Vision impaired- to be further tested in functional context Additional Comments: difficult to fully assess due to pt difficulty following commands. Suspect R visual field deficit (mild) vs inattention (pt is unaware of deficit)     Perception     Praxis      Pertinent Vitals/Pain Pain Assessment: No/denies pain     Hand Dominance Right   Extremity/Trunk Assessment Upper Extremity Assessment Upper Extremity Assessment: Generalized weakness;RUE deficits/detail RUE Deficits / Details: reports differing sensation "I feel like its not my hand". Proprioception is intact. Would benefit from further sensory deficit testing RUE Coordination: decreased fine motor;decreased gross motor   Lower Extremity Assessment Lower Extremity Assessment: Defer to PT evaluation RLE Deficits / Details: MMT scores of grossly 4 to 4+ RLE Sensation: decreased proprioception (dynamic prop = delayed in ankle, intact in knee) RLE Coordination: WNL LLE Deficits / Details: MMT scores of grossly 4- to 4+ LLE Sensation: decreased proprioception (dynamic prop = delayed in ankle, intact in knee) LLE Coordination: decreased fine motor (possible dysdiadochokinesia noted)   Cervical / Trunk Assessment Cervical / Trunk Assessment: Normal   Communication Communication Communication: No difficulties   Cognition Arousal/Alertness: Awake/alert Behavior During Therapy: WFL for tasks assessed/performed Overall Cognitive Status: Impaired/Different from baseline Area of Impairment: Attention;Safety/judgement;Awareness;Problem solving                   Current Attention Level: Sustained     Safety/Judgement: Decreased awareness of safety;Decreased awareness of deficits Awareness: Emergent Problem Solving: Difficulty  sequencing;Requires verbal cues General Comments: pt with decreased awareness of deficits and requires increased cues and time to problem solve basic ADL commands   General Comments       Exercises     Shoulder Instructions      Home Living Family/patient expects to be discharged to:: Private residence Living Arrangements: Alone Available Help at Discharge: Family;Available 24 hours/day Type of Home: Other(Comment) (condo) Home Access: Level entry     Home Layout: One level     Bathroom Shower/Tub: Occupational psychologist: Handicapped height     Home Equipment: Environmental consultant - 2 wheels;Bedside commode;Wheelchair - manual;Grab bars - toilet;Grab bars - tub/shower;Shower seat;Walker - 4 wheels          Prior Functioning/Environment Level of Independence: Independent with assistive device(s)        Comments: Utilizes rollator for mobility as needed. On good days goes without device        OT Problem List: Decreased strength;Decreased knowledge of use of DME or AE;Impaired vision/perception;Decreased activity tolerance;Decreased cognition;Impaired balance (sitting and/or standing);Decreased safety awareness      OT Treatment/Interventions: Self-care/ADL training;Therapeutic exercise;Patient/family education;Balance training;Visual/perceptual remediation/compensation;Energy conservation;Therapeutic activities;DME and/or AE instruction;Cognitive remediation/compensation    OT Goals(Current goals can be found in the care plan section) Acute Rehab OT Goals Patient Stated Goal: to return to prior level of function and go home OT Goal Formulation: With patient Time For Goal Achievement: 05/03/20 Potential to Achieve Goals: Good  OT Frequency: Min 2X/week   Barriers to D/C:  Co-evaluation   Reason for Co-Treatment: For patient/therapist safety;To address functional/ADL transfers PT goals addressed during session: Mobility/safety with  mobility;Balance;Proper use of DME        AM-PAC OT "6 Clicks" Daily Activity     Outcome Measure Help from another person eating meals?: A Little Help from another person taking care of personal grooming?: A Little Help from another person toileting, which includes using toliet, bedpan, or urinal?: A Little Help from another person bathing (including washing, rinsing, drying)?: A Lot Help from another person to put on and taking off regular upper body clothing?: A Little Help from another person to put on and taking off regular lower body clothing?: A Lot 6 Click Score: 16   End of Session Equipment Utilized During Treatment: Gait belt;Rolling walker Nurse Communication: Mobility status  Activity Tolerance: Patient tolerated treatment well Patient left: in chair;with call bell/phone within reach;with chair alarm set  OT Visit Diagnosis: Unsteadiness on feet (R26.81);Other abnormalities of gait and mobility (R26.89);Other symptoms and signs involving cognitive function                Time: 1451-1528 OT Time Calculation (min): 37 min Charges:  OT General Charges $OT Visit: 1 Visit OT Evaluation $OT Eval Moderate Complexity: Brent, MSOT, OTR/L [facility] Office Number: [phone] Pager: [phone]  Zenovia Jarred 04/19/2020, 5:08 PM

## 2020-04-19 NOTE — Progress Notes (Signed)
STROKE TEAM PROGRESS NOTE   INTERVAL HISTORY She presented with several day history of right leg weakness and right hand numbness and MRI scan shows some left ACA infarct and MRI angiogram shows progressive worsening of left A2-A3 ACA stenosis with moderate stenosis the left P2-P3 segment and bilateral ICA paraclinoid segments.  She has a prior history of lacunar infarcts in February 2021 for which she was seen by me and enrolled in the [place] stroke prevention study.  She was readmitted in September with right-sided weakness due to left frontal infarct felt to be due to left M2 stenosis felt to be embolic.  She was in the process of getting outpatient cardiac monitoring and in fact has been wearing a Holter monitor for the last 2 weeks results of which are unknown at this time.  She also has sleep apnea and is being treated by Dr. Rexene Alberts with CPAP at [place]:   04/18/20 1952 04/19/20 0011 04/19/20 0437 04/19/20 0840  BP: (!) 103/32 112/63 (!) 118/59 124/62  Pulse: 81 75  75  Resp: 14 16  18   Temp: 98.2 F (36.8 C) 98.4 F (36.9 C) 97.6 F (36.4 C) 98 F (36.7 C)  TempSrc: Oral Oral Oral Oral  SpO2: 96% 94%  98%  Weight:      Height:       CBC:  Recent Labs  Lab 04/18/20 0943 04/18/20 0955  WBC 7.3  --   NEUTROABS 5.7  --   HGB 15.9* 16.7*  HCT 48.7* 49.0*  MCV 95.1  --   PLT 293  --    Basic Metabolic Panel:  Recent Labs  Lab 04/18/20 0943 04/18/20 0955  NA 140 141  K 3.9 4.1  CL 105 104  CO2 24  --   GLUCOSE 114* 108*  BUN 10 11  CREATININE 0.98 0.80  CALCIUM 10.2  --    Lipid Panel:  Recent Labs  Lab 04/19/20 0253  CHOL 95  TRIG 103  HDL 26*  CHOLHDL 3.7  VLDL 21  LDLCALC 48   HgbA1c:  Recent Labs  Lab 04/19/20 0933  HGBA1C 5.3   Urine Drug Screen: No results for input(s): LABOPIA, COCAINSCRNUR, LABBENZ, AMPHETMU, THCU, LABBARB in the last 168 hours.  Alcohol Level No results for input(s): ETH in the last 168 hours.  IMAGING  past 24 hours CT HEAD WO CONTRAST  Result Date: 04/18/2020 CLINICAL DATA:  Numbness. EXAM: CT HEAD WITHOUT CONTRAST TECHNIQUE: Contiguous axial images were obtained from the base of the skull through the vertex without intravenous contrast. COMPARISON:  February 16, 2020. FINDINGS: Brain: Mild chronic ischemic white matter disease is noted. 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 No mass effect or midline shift is noted. Ventricular size is within normal limits. There is no evidence of mass lesion, hemorrhage or acute infarction. Vascular: No hyperdense vessel or unexpected calcification. Skull: Normal. Negative for fracture or focal lesion. Sinuses/Orbits: No acute finding. Other: None. IMPRESSION: Mild chronic ischemic white matter disease. 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 No acute intracranial abnormality seen. Electronically Signed   By: Marijo Conception M.D.   On: 04/18/2020 13:45   MR ANGIO HEAD WO CONTRAST  Result Date: 04/18/2020 CLINICAL DATA:  Neuro deficit, acute stroke suspected. EXAM: MRA HEAD WITHOUT CONTRAST TECHNIQUE: Angiographic images of the Circle of Willis were obtained using MRA technique without intravenous contrast. COMPARISON:  MRA or of the brain February 16, 2020. FINDINGS: The bilateral vertebral arteries and  the basilar artery have normal flow related enhancement. Luminal irregularities are noted along the posterior cerebral arteries with moderate stenosis at the P2P and P3 segments on the left. Luminal irregularities along the bilateral carotid siphons with moderate stenosis at the paraclinoid segment. Attenuation of the flow related enhancement at the left A2-A3 junction consistent with severe stenosis. The bilateral MCA and and right ACA vascular tree have normal flow related enhancement. The degree of stenosis of the left ACA has progressed from prior MRA. IMPRESSION: 1. 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  2. Moderate stenosis at the P2P and P3 segments on the left, and moderate stenosis at the paraclinoid segment of the bilateral ICA. Electronically Signed   By: Pedro Earls M.D.   On: 04/18/2020 19:19   MR BRAIN WO CONTRAST  Result Date: 04/18/2020 CLINICAL DATA:  Numbness of right side EXAM: MRI HEAD WITHOUT CONTRAST TECHNIQUE: Multiplanar, multiecho pulse sequences of the brain and surrounding structures were obtained without intravenous contrast. COMPARISON:  02/16/2020 FINDINGS: Brain: There is patchy reduced diffusion involving the left superior frontal gyrus, cingulate gyrus, and body of the corpus callosum. There is also some involvement of the parasagittal left parietal lobe. Left parietal focus susceptibility is again identified and probably reflects chronic microhemorrhage. Patchy and confluent areas of T2 hyperintensity in the supratentorial and pontine white matter are nonspecific but probably reflects stable moderate to advanced chronic microvascular ischemic changes. There is a chronic small vessel infarct of the right corona radiata. There is no intracranial mass or mass effect. Ventricles are stable in size. No hydrocephalus or extra-axial collection. Vascular: Major vessel flow voids at the skull base are preserved. Skull and upper cervical spine: Normal marrow signal is preserved. Sinuses/Orbits: Trace mucosal thick.  Bilateral lens replacement. Other: Sella is unremarkable.  Mastoid air cells are clear. IMPRESSION: Acute left ACA territory infarcts.  No hemorrhage or mass effect. Stable chronic findings detailed above including moderate to advanced chronic microvascular ischemic changes. Electronically Signed   By: Macy Mis M.D.   On: 04/18/2020 15:41    PHYSICAL EXAM Pleasant elderly Caucasian lady not in distress. . Afebrile. Head is nontraumatic. Neck is supple without bruit.    Cardiac exam no murmur or gallop. Lungs are clear to auscultation. Distal pulses are  well felt. Neurological Exam : She is awake alert oriented to time place and person.  Speech and language appear normal.  Extraocular movements are full range without nystagmus.  She blinks to threat bilaterally.  No facial weakness.  Tongue midline.  Motor system exam shows no upper or lower extremity drift but mild weakness of right hip flexors and knee extensors only.  Sensation is intact bilaterally.  Deep tendon reflexes symmetric.  Plantars downgoing.  Gait not tested. ASSESSMENT/PLAN [name] is a 79 y.o. female with history of HTN, HLD, CVA, GERD, IBS, PNA, obesity, multifocal intracranial atherosclerosis / stenosis presenting 04/18/2020 with R arm numbness and tingling and dragging her R foot since 04/13/2020.   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  Prior history of lacunar stroke in February 2019 as well as left frontal infarct from intracranial atherosclerosis in September 2021  CT head No acute abnormality. Small vessel disease. Old R periventricular white matter infarcts.   MRI  Acute L ACA infarcts. Stable small vessel disease.   MRA  Progressed severe L A2-3 stenosis; moderate L P2P + P3  and paraclinoid B ICA stenoses  Carotid Doppler  pending   2D Echo 01/2020 EF 55-60%. No source of embolus   OP cardiac monitor worn x 2 weeks. Awaiting formal report   LDL 48  HgbA1c 5.3  VTE prophylaxis - Lovenox 40 mg sq daily   clopidogrel 75 mg daily prior to admission, now on aspirin 325 mg daily and clopidogrel 75 mg daily. P2Y12 of 4 shows plavix function.   Therapy recommendations:  pending   Disposition:  pending   Hypertension  Stable . Permissive hypertension (OK if < 220/120) but gradually normalize in 5-7 days . Long-term BP goal normotensive  Hyperlipidemia  Home meds:  lipitor 80, resumed in hospital  LDL 48, goal < 70  Continue statin at discharge  Other Stroke Risk  Factors  Advanced Age >/= 31   Obesity, Body mass index is 30.91 kg/m., BMI >/= 30 associated with increased stroke risk, recommend weight loss, diet and exercise as appropriate   Hx stroke/TIA  01/2020 - L frontal frontal strokes likely from stenosis in the L M2 those are all in same vascular territory and are not embolic source  05/9756 -  R MCA basal ganglia infarcts secondary to small vessel disease source. Enrolled in [employer] trial  Family hx stroke (Paternal Grandfather)  Obstructive sleep apnea, on CPAP at home  Hx small AAA on imaging  Other Active Problems  Elevated hgb 16.7  Hospital day # 1 Patient has had both lacunar strokes as well as stroke from Dr. Atherosclerosis in the past but now presents with left ACA infarct from progressive intracranial atherosclerosis as well.  Recommend change Plavix to aspirin and Brilinta for 3 months followed by aspirin alone and aggressive risk factor modification.  Physical occupational therapy consults.  Mobilize out of bed.  I do not believe intracranial angioplasty or stenting is indicated at the present time given multifocal nature of her stenosis and recurrent strokes with multiple different pathologies.  Discussed with Dr. Reesa Chew and patient.  Greater than 50% time during this 35-minute visit was spent on counseling and coordination of care about her stroke integrin atherosclerosis and discussion about evaluation and treatment and answering questions. Antony Contras, MD To contact Stroke Continuity provider, please refer to [email or web address] After hours, contact General Neurology

## 2020-04-20 ENCOUNTER — Encounter: Payer: Self-pay | Admitting: Family Medicine

## 2020-04-20 ENCOUNTER — Inpatient Hospital Stay (HOSPITAL_COMMUNITY): Payer: Medicare HMO

## 2020-04-20 DIAGNOSIS — I63522 Cerebral infarction due to unspecified occlusion or stenosis of left anterior cerebral artery: Secondary | ICD-10-CM

## 2020-04-20 DIAGNOSIS — I639 Cerebral infarction, unspecified: Secondary | ICD-10-CM | POA: Diagnosis not present

## 2020-04-20 LAB — CBC
HCT: 41.3 % (ref 36.0–46.0)
Hemoglobin: 13.5 g/dL (ref 12.0–15.0)
MCH: 30.5 pg (ref 26.0–34.0)
MCHC: 32.7 g/dL (ref 30.0–36.0)
MCV: 93.2 fL (ref 80.0–100.0)
Platelets: 201 10*3/uL (ref 150–400)
RBC: 4.43 MIL/uL (ref 3.87–5.11)
RDW: 12.8 % (ref 11.5–15.5)
WBC: 5.9 10*3/uL (ref 4.0–10.5)
nRBC: 0 % (ref 0.0–0.2)

## 2020-04-20 LAB — BASIC METABOLIC PANEL
Anion gap: 12 (ref 5–15)
BUN: 9 mg/dL (ref 8–23)
CO2: 21 mmol/L — ABNORMAL LOW (ref 22–32)
Calcium: 9.6 mg/dL (ref 8.9–10.3)
Chloride: 108 mmol/L (ref 98–111)
Creatinine, Ser: 0.82 mg/dL (ref 0.44–1.00)
GFR, Estimated: >60 mL/min (ref >60)
Glucose, Bld: 110 mg/dL — ABNORMAL HIGH (ref 70–99)
Potassium: 3.6 mmol/L (ref 3.5–5.1)
Sodium: 141 mmol/L (ref 135–145)

## 2020-04-20 LAB — MAGNESIUM: Magnesium: 2 mg/dL (ref 1.7–2.4)

## 2020-04-20 MED ORDER — POTASSIUM CHLORIDE CRYS ER 20 MEQ PO TBCR
40.0000 meq | EXTENDED_RELEASE_TABLET | Freq: Once | ORAL | Status: AC
  Administered 2020-04-20: 40 meq via ORAL
  Filled 2020-04-20: qty 2

## 2020-04-20 NOTE — Progress Notes (Signed)
Patient has not had loose stool since the beginning of shift. Enteric precaution and C-diff stool screening discontinued as indicated by infection prevention nurse.

## 2020-04-20 NOTE — Progress Notes (Signed)
Carotid artery duplex completed. Refer to "CV Proc" under chart review to view preliminary results.  04/20/2020 11:48 AM Kelby Aline., MHA, RVT, RDCS, RDMS

## 2020-04-20 NOTE — Progress Notes (Signed)
PROGRESS NOTE    [name] [name]  ION:[id number] DOB: 1940/11/03 DOA: 04/18/2020 PCP: Tonia Ghent, MD   Brief Narrative:  79 year old with history of obstructive sleep apnea on CPAP, HTN, HLD, CVA in 2021 February and September presenting with right-sided arm numbness.  MRI brain confirmed acute left ACA territory infarcts.  Follows outpatient cardiology for A. fib.  Per neurology patient will require aspirin and Brilinta for at least 3 months and then Brilinta alone if affordable.  PT recommended CIR therefore arrangements be made.   Assessment & Plan:   Principal Problem:   Acute CVA (cerebrovascular accident) (Major) Active Problems:   HYPERCHOLESTEROLEMIA   Essential hypertension   Sleep apnea   Class 1 obesity due to excess calories with body mass index (BMI) of 30.0 to 30.9 in adult   Acute left ACA territory infarct -Stroke protocol -Permissive hypertension at this point  -MRI brain positive for left ACA CVA -MRA head without contrast-severe stenosis of left anterior circulating area, moderate stenosis of P2 P.  Defer the need for vascular evaluation to neurology -Patient will need to be on aspirin and Brilinta for 3 months minimum and Brilinta thereafter if affordable. -No repeat echocardiogram needed, done recently -LDL 48 -Frequent neuro checks -Lipitor 40 mg daily -Risk factor modification -Neurology following -PT/OT eval-recommending CIR  Essential hypertension -Permissive hypertension  Hyperlipidemia -Lipitor daily  Obstructive sleep apnea -CPAP  Obesity with BMI greater than 25 -Weight loss diet and exercise  DVT prophylaxis: Lovenox Code Status: Full code Family Communication:  Called her daughter [name]  Status is: Inpatient  Remains inpatient appropriate because:Inpatient level of care appropriate due to severity of illness   Dispo: The patient is from: Home              Anticipated d/c is to: CIR              Anticipated d/c date is: 1  day              Patient currently stable for CIR   Body mass index is 30.91 kg/m.     Subjective: Feels ok, no new complaints.    Examination: Constitutional: Not in acute distress Respiratory: Clear to auscultation bilaterally Cardiovascular: Normal sinus rhythm, no rubs Abdomen: Nontender nondistended good bowel sounds Musculoskeletal: No edema noted Skin: No rashes seen Neurologic: CN 2-12 grossly intact.  And nonfocal Psychiatric: Normal judgment and insight. Alert and oriented x 3. Normal mood.   Objective: Vitals:   04/19/20 2017 04/19/20 2335 04/20/20 0432 04/20/20 0834  BP: (!) 145/61 (!) 146/96 (!) 148/74 (!) 165/85  Pulse: 80 80 75 78  Resp: 18 17 18 18   Temp: 97.7 F (36.5 C) 98 F (36.7 C) 97.6 F (36.4 C) 98 F (36.7 C)  TempSrc: Oral Oral Oral Oral  SpO2: 96% 98% 97% 97%  Weight:      Height:        Intake/Output Summary (Last 24 hours) at 04/20/2020 1039 Last data filed at 04/20/2020 0650 Gross per 24 hour  Intake 476.64 ml  Output 2400 ml  Net -1923.36 ml   Filed Weights   04/18/20 0941  Weight: 76.7 kg     Data Reviewed:   CBC: Recent Labs  Lab 04/18/20 0943 04/18/20 0955 04/20/20 0407  WBC 7.3  --  5.9  NEUTROABS 5.7  --   --   HGB 15.9* 16.7* 13.5  HCT 48.7* 49.0* 41.3  MCV 95.1  --  93.2  PLT 293  --  440   Basic Metabolic Panel: Recent Labs  Lab 04/18/20 0943 04/18/20 0955 04/20/20 0407  NA 140 141 141  K 3.9 4.1 3.6  CL 105 104 108  CO2 24  --  21*  GLUCOSE 114* 108* 110*  BUN 10 11 9   CREATININE 0.98 0.80 0.82  CALCIUM 10.2  --  9.6  MG  --   --  2.0   GFR: Estimated Creatinine Clearance: 55.1 mL/min (by C-G formula based on SCr of 0.82 mg/dL). Liver Function Tests: Recent Labs  Lab 04/18/20 0943  AST 30  ALT 21  ALKPHOS 104  BILITOT 1.2  PROT 6.7  ALBUMIN 4.3   Recent Labs  Lab 04/18/20 1124  LIPASE 39   No results for input(s): AMMONIA in the last 168 hours. Coagulation Profile: Recent  Labs  Lab 04/18/20 0943  INR 1.0   Cardiac Enzymes: No results for input(s): CKTOTAL, CKMB, CKMBINDEX, TROPONINI in the last 168 hours. BNP (last 3 results) No results for input(s): PROBNP in the last 8760 hours. HbA1C: Recent Labs    04/19/20 0933  HGBA1C 5.3   CBG: Recent Labs  Lab 04/18/20 0937  GLUCAP 113*   Lipid Profile: Recent Labs    04/19/20 0253  CHOL 95  HDL 26*  LDLCALC 48  TRIG 103  CHOLHDL 3.7   Thyroid Function Tests: Recent Labs    04/19/20 0933  TSH 1.235   Anemia Panel: No results for input(s): VITAMINB12, FOLATE, FERRITIN, TIBC, IRON, RETICCTPCT in the last 72 hours. Sepsis Labs: No results for input(s): PROCALCITON, LATICACIDVEN in the last 168 hours.  Recent Results (from the past 240 hour(s))  Urine culture     Status: None   Collection Time: 04/18/20 11:42 AM   Specimen: Urine, Clean Catch  Result Value Ref Range Status   Specimen Description URINE, CLEAN CATCH  Final   Special Requests NONE  Final   Culture   Final    NO GROWTH Performed at [facility], [address] [place], [place] [address]    Report Status 04/19/2020 FINAL  Final  Resp Panel by RT-PCR (Flu A&B, Covid) Nasopharyngeal Swab     Status: None   Collection Time: 04/18/20  5:09 PM   Specimen: Nasopharyngeal Swab; Nasopharyngeal(NP) swabs in vial transport medium  Result Value Ref Range Status   SARS Coronavirus 2 by RT PCR NEGATIVE NEGATIVE Final    Comment: (NOTE) SARS-CoV-2 target nucleic acids are NOT DETECTED.  The SARS-CoV-2 RNA is generally detectable in upper respiratory specimens during the acute phase of infection. The lowest concentration of SARS-CoV-2 viral copies this assay can detect is 138 copies/mL. A negative result does not preclude SARS-Cov-2 infection and should not be used as the sole basis for treatment or other patient management decisions. A negative result may occur with  improper specimen collection/handling, submission of  specimen other than nasopharyngeal swab, presence of viral mutation(s) within the areas targeted by this assay, and inadequate number of viral copies(<138 copies/mL). A negative result must be combined with clinical observations, patient history, and epidemiological information. The expected result is Negative.  Fact Sheet for Patients:  EntrepreneurPulse.com.au  Fact Sheet for Healthcare Providers:  IncredibleEmployment.be  This test is no t yet approved or cleared by the [place] FDA and  has been authorized for detection and/or diagnosis of SARS-CoV-2 by FDA under an Emergency Use Authorization (EUA). This EUA will remain  in effect (meaning this test can be used) for the duration of  the COVID-19 declaration under Section 564(b)(1) of the Act, 21 U.S.C.section 360bbb-3(b)(1), unless the authorization is terminated  or revoked sooner.       Influenza A by PCR NEGATIVE NEGATIVE Final   Influenza B by PCR NEGATIVE NEGATIVE Final    Comment: (NOTE) The Xpert Xpress SARS-CoV-2/FLU/RSV plus assay is intended as an aid in the diagnosis of influenza from Nasopharyngeal swab specimens and should not be used as a sole basis for treatment. Nasal washings and aspirates are unacceptable for Xpert Xpress SARS-CoV-2/FLU/RSV testing.  Fact Sheet for Patients: EntrepreneurPulse.com.au  Fact Sheet for Healthcare Providers: IncredibleEmployment.be  This test is not yet approved or cleared by the [place] FDA and has been authorized for detection and/or diagnosis of SARS-CoV-2 by FDA under an Emergency Use Authorization (EUA). This EUA will remain in effect (meaning this test can be used) for the duration of the COVID-19 declaration under Section 564(b)(1) of the Act, 21 U.S.C. section 360bbb-3(b)(1), unless the authorization is terminated or revoked.  Performed at [facility], [place] [address],  [place], [place] [address]          Radiology Studies: CT HEAD WO CONTRAST  Result Date: 04/18/2020 CLINICAL DATA:  Numbness. EXAM: CT HEAD WITHOUT CONTRAST TECHNIQUE: Contiguous axial images were obtained from the base of the skull through the vertex without intravenous contrast. COMPARISON:  February 16, 2020. FINDINGS: Brain: Mild chronic ischemic white matter disease is noted. 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 No mass effect or midline shift is noted. Ventricular size is within normal limits. There is no evidence of mass lesion, hemorrhage or acute infarction. Vascular: No hyperdense vessel or unexpected calcification. Skull: Normal. Negative for fracture or focal lesion. Sinuses/Orbits: No acute finding. Other: None. IMPRESSION: Mild chronic ischemic white matter disease. 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 No acute intracranial abnormality seen. Electronically Signed   By: Marijo Conception M.D.   On: 04/18/2020 13:45   MR ANGIO HEAD WO CONTRAST  Result Date: 04/18/2020 CLINICAL DATA:  Neuro deficit, acute stroke suspected. EXAM: MRA HEAD WITHOUT CONTRAST TECHNIQUE: Angiographic images of the Circle of Willis were obtained using MRA technique without intravenous contrast. COMPARISON:  MRA or of the brain February 16, 2020. FINDINGS: The bilateral vertebral arteries and the basilar artery have normal flow related enhancement. Luminal irregularities are noted along the posterior cerebral arteries with moderate stenosis at the P2P and P3 segments on the left. Luminal irregularities along the bilateral carotid siphons with moderate stenosis at the paraclinoid segment. Attenuation of the flow related enhancement at the left A2-A3 junction consistent with severe stenosis. The bilateral MCA and and right ACA vascular tree have normal flow related enhancement. The degree of stenosis of the left ACA has progressed from prior MRA. IMPRESSION: 1. Severe stenosis at the  left A2-A3/ACA, progressed from prior MRA. 2. Moderate stenosis at the P2P and P3 segments on the left, and moderate stenosis at the paraclinoid segment of the bilateral ICA. Electronically Signed   By: Pedro Earls M.D.   On: 04/18/2020 19:19   MR BRAIN WO CONTRAST  Result Date: 04/18/2020 CLINICAL DATA:  Numbness of right side EXAM: MRI HEAD WITHOUT CONTRAST TECHNIQUE: Multiplanar, multiecho pulse sequences of the brain and surrounding structures were obtained without intravenous contrast. COMPARISON:  02/16/2020 FINDINGS: Brain: There is patchy reduced diffusion involving the left superior frontal gyrus, cingulate gyrus, and body of the corpus callosum. There is also some involvement of the parasagittal left parietal lobe.  Left parietal focus susceptibility is again identified and probably reflects chronic microhemorrhage. Patchy and confluent areas of T2 hyperintensity in the supratentorial and pontine white matter are nonspecific but probably reflects stable moderate to advanced chronic microvascular ischemic changes. There is a chronic small vessel infarct of the right corona radiata. There is no intracranial mass or mass effect. Ventricles are stable in size. No hydrocephalus or extra-axial collection. Vascular: Major vessel flow voids at the skull base are preserved. Skull and upper cervical spine: Normal marrow signal is preserved. Sinuses/Orbits: Trace mucosal thick.  Bilateral lens replacement. Other: Sella is unremarkable.  Mastoid air cells are clear. IMPRESSION: Acute left ACA territory infarcts.  No hemorrhage or mass effect. Stable chronic findings detailed above including moderate to advanced chronic microvascular ischemic changes. Electronically Signed   By: Macy Mis M.D.   On: 04/18/2020 15:41        Scheduled Meds: . atorvastatin  80 mg Oral QPM  . enoxaparin (LOVENOX) injection  40 mg Subcutaneous Q24H  . ipratropium  1 spray Each Nare BID  . montelukast   10 mg Oral QHS  . ticagrelor  90 mg Oral BID   Continuous Infusions: . sodium chloride 50 mL/hr at 04/18/20 1759     LOS: 2 days   Time spent= 35 mins    Lynnita Somma Arsenio Loader, MD [facility]  If 7PM-7AM, please contact night-coverage  04/20/2020, 10:39 AM

## 2020-04-20 NOTE — TOC Benefit Eligibility Note (Signed)
Transition of Care [facility]) Benefit Eligibility Note    Patient Details  Name: [name] [name] MRN: [id number] Date of Birth: 23-Aug-1940   Medication/Dose: BRILINTA 90 MG BID  Covered?: Yes  Tier: 3 Drug  Prescription Coverage Preferred Pharmacy: [facility]  and [facility]  Spoke with Person/Company/Phone Number:: MARIA  @ [employer] # [phone]  Co-Pay: $45.00  Prior Approval: No  Deductible: Unmet (OUT-OF-POCKET : [place] Ham Phone Number: 04/20/2020, 1:54 PM

## 2020-04-20 NOTE — Progress Notes (Signed)
Benefits check submitted for Brilinta.

## 2020-04-20 NOTE — Progress Notes (Signed)
Physical Therapy Treatment Patient Details Name: [name] [name] MRN: [id number] DOB: 11-30-1940 Today's Date: 04/20/2020    History of Present Illness Pt is a 79 year old female who presented with intermittent R arm and leg numbness/tingling and weakness that have lasted 2-3 days. Her daughter also reported the pt has been "out of it" and not herself the past few days. 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 She was not administered tPA. NIHSS = 0. 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 PMH: CVAs, sleep apnea, pneumonia, obesity, IBS, HTN, depression, and arthritis.    PT Comments    Pt demonstrates progress towards her goals as she was able to negotiate 1 stair this date with use of bilat hand rails and minA. However, she is at a high risk for falls, supported by her DGI score of 10 this date. She demonstrates impaired cognitive function also through slowed processing and decreased safety awareness. She tried to continue ambulating even when provided max cues to acknowledge an obstacle on her R and to correct herself, but ultimately required some assistance to avoid it. Pt would greatly benefit from intensive therapy treatments in the CIR setting as she was independent with AD/AE at baseline and has had multiple CVAs this year. Will continue to follow acutely.    Follow Up Recommendations  CIR;Supervision for mobility/OOB (if not CIR then [facility] PT)     Equipment Recommendations  None recommended by PT    Recommendations for Other Services Rehab consult     Precautions / Restrictions Precautions Precautions: Fall Restrictions Weight Bearing Restrictions: No    Mobility  Bed Mobility Overal bed mobility: Needs Assistance Bed Mobility: Supine to Sit     Supine to sit: Min guard;HOB elevated      General bed mobility comments: Pt required use of bed rails with HOB elevated to advance legs off EOB and ascend trunk. Min guard assist for safety.  Transfers Overall transfer level: Needs assistance Equipment used: Rolling walker (2 wheeled) Transfers: Sit to/from Stand Sit to Stand: Min assist         General transfer comment: MinA for steadying with power up to stand. Extra time and cues provided for hand placement as pt tends to try to pull up on RW.  Ambulation/Gait Ambulation/Gait assistance: Min assist Gait Distance (Feet): 175 Feet Assistive device: Rolling walker (2 wheeled) Gait Pattern/deviations: Step-through pattern;Decreased stride length;Narrow base of support;Trunk flexed Gait velocity: decreased Gait velocity interpretation: <1.31 ft/sec, indicative of household ambulator General Gait Details: Pt ambulates with R leg externally rotated, L leg internally rotated, and excessively narrow BOS. Provided verbal and visual cues to improve stance width, with min success. Pt attempted to ambulate through object on R hand side despite max cues to look and correct herself, thus minA to maintain safety. Displays intermittent shaking at knees with gait.   Stairs Stairs: Yes Stairs assistance: Min assist Stair Management: Two rails;Step to pattern Number of Stairs: 1 General stair comments: Extra time and minA to maintain balance, cuing pt to sequence steps.   Wheelchair Mobility    Modified Rankin (Stroke Patients Only) Modified Rankin (Stroke Patients Only) Pre-Morbid Rankin Score: No significant disability Modified Rankin: Moderately severe disability     Balance Overall balance assessment: Needs assistance Sitting-balance support: No upper  extremity supported;Feet supported Sitting balance-Leahy Scale: Good Sitting balance - Comments: Sitting EOB no LOB when feet supported but posterior lean when feet unsupported, supervision for safety.   Standing balance  support: Bilateral upper extremity supported;During functional activity Standing balance-Leahy Scale: Poor Standing balance comment: Reliant on UE support for balance.                 Standardized Balance Assessment Standardized Balance Assessment : Dynamic Gait Index   Dynamic Gait Index Level Surface: Mild Impairment Change in Gait Speed: Moderate Impairment Gait with Horizontal Head Turns: Moderate Impairment Gait with Vertical Head Turns: Moderate Impairment Gait and Pivot Turn: Moderate Impairment Step Over Obstacle: Mild Impairment Step Around Obstacles: Moderate Impairment Steps: Moderate Impairment Total Score: 10      Cognition Arousal/Alertness: Awake/alert Behavior During Therapy: WFL for tasks assessed/performed Overall Cognitive Status: Impaired/Different from baseline Area of Impairment: Attention;Safety/judgement;Awareness;Problem solving                   Current Attention Level: Sustained     Safety/Judgement: Decreased awareness of safety;Decreased awareness of deficits Awareness: Emergent Problem Solving: Difficulty sequencing;Requires verbal cues;Slow processing General Comments: Pt with decreased awareness of deficits and requires cues to make pt aware of possible safety concerns and extra time to process and correct.      Exercises      General Comments        Pertinent Vitals/Pain Pain Assessment: No/denies pain    Home Living                      Prior Function            PT Goals (current goals can now be found in the care plan section) Acute Rehab PT Goals Patient Stated Goal: to get a bath PT Goal Formulation: With patient Time For Goal Achievement: 05/03/20 Potential to Achieve Goals: Good Progress towards PT goals: Progressing toward goals    Frequency    Min 4X/week      PT Plan Current plan remains appropriate    Co-evaluation              AM-PAC PT "6 Clicks" Mobility   Outcome  Measure  Help needed turning from your back to your side while in a flat bed without using bedrails?: A Little Help needed moving from lying on your back to sitting on the side of a flat bed without using bedrails?: A Little Help needed moving to and from a bed to a chair (including a wheelchair)?: A Little Help needed standing up from a chair using your arms (e.g., wheelchair or bedside chair)?: A Little Help needed to walk in hospital room?: A Little Help needed climbing 3-5 steps with a railing? : A Lot 6 Click Score: 17    End of Session Equipment Utilized During Treatment: Gait belt Activity Tolerance: Patient tolerated treatment well Patient left: in chair;with call bell/phone within reach;with chair alarm set Nurse Communication: Mobility status;Other (comment) (desire for bath) PT Visit Diagnosis: Unsteadiness on feet (R26.81);Other abnormalities of gait and mobility (R26.89);Muscle weakness (generalized) (M62.81);History of falling (Z91.81);Difficulty in walking, not elsewhere classified (R26.2);Other symptoms and signs involving the nervous system (R29.898);Hemiplegia and hemiparesis Hemiplegia - Right/Left:  (bilat from prior CVAs and acute CVA) Hemiplegia - dominant/non-dominant:  (bilat) Hemiplegia - caused by: Cerebral infarction     Time: 1352-1416 PT Time Calculation (min) (ACUTE ONLY): 24 min  Charges:  $Gait Training: 23-37 mins  Moishe Spice, PT, DPT Acute Rehabilitation Services  Pager: [phone] Office: [place] 04/20/2020, 2:25 PM

## 2020-04-20 NOTE — Progress Notes (Signed)
STROKE TEAM PROGRESS NOTE   INTERVAL HISTORY Patient is lying in bed comfortably.  Right leg weakness is improving but not back to baseline.  Therapy has evaluated her and recommend inpatient rehab.  Vital signs are stable.  Neurological exam unchanged.  No new complaints .  Vitals:   04/19/20 2017 04/19/20 2335 04/20/20 0432 04/20/20 0834  BP: (!) 145/61 (!) 146/96 (!) 148/74 (!) 165/85  Pulse: 80 80 75 78  Resp: 18 17 18 18   Temp: 97.7 F (36.5 C) 98 F (36.7 C) 97.6 F (36.4 C) 98 F (36.7 C)  TempSrc: Oral Oral Oral Oral  SpO2: 96% 98% 97% 97%  Weight:      Height:       CBC:  Recent Labs  Lab 04/18/20 0943 04/18/20 0943 04/18/20 0955 04/20/20 0407  WBC 7.3  --   --  5.9  NEUTROABS 5.7  --   --   --   HGB 15.9*   < > 16.7* 13.5  HCT 48.7*   < > 49.0* 41.3  MCV 95.1  --   --  93.2  PLT 293  --   --  201   < > = values in this interval not displayed.   Basic Metabolic Panel:  Recent Labs  Lab 04/18/20 0943 04/18/20 0943 04/18/20 0955 04/20/20 0407  NA 140   < > 141 141  K 3.9   < > 4.1 3.6  CL 105   < > 104 108  CO2 24  --   --  21*  GLUCOSE 114*   < > 108* 110*  BUN 10   < > 11 9  CREATININE 0.98   < > 0.80 0.82  CALCIUM 10.2  --   --  9.6  MG  --   --   --  2.0   < > = values in this interval not displayed.   Lipid Panel:  Recent Labs  Lab 04/19/20 0253  CHOL 95  TRIG 103  HDL 26*  CHOLHDL 3.7  VLDL 21  LDLCALC 48   HgbA1c:  Recent Labs  Lab 04/19/20 0933  HGBA1C 5.3   Urine Drug Screen: No results for input(s): LABOPIA, COCAINSCRNUR, LABBENZ, AMPHETMU, THCU, LABBARB in the last 168 hours.  Alcohol Level No results for input(s): ETH in the last 168 hours.  IMAGING past 24 hours No results found.  PHYSICAL EXAM   Pleasant elderly Caucasian lady not in distress. . Afebrile. Head is nontraumatic. Neck is supple without bruit.    Cardiac exam no murmur or gallop. Lungs are clear to auscultation. Distal pulses are well felt. Neurological  Exam : She is awake alert oriented to time place and person.  Speech and language appear normal.  Extraocular movements are full range without nystagmus.  She blinks to threat bilaterally.  No facial weakness.  Tongue midline.  Motor system exam shows no upper or lower extremity drift but mild weakness of right hip flexors and knee extensors only.  Sensation is intact bilaterally.  Deep tendon reflexes symmetric.  Plantars downgoing.  Gait not tested.  ASSESSMENT/PLAN Ms. ARNISHA LAFFOON is a 79 y.o. female with history of HTN, HLD, CVA, GERD, IBS, PNA, obesity, multifocal intracranial atherosclerosis / stenosis presenting 04/18/2020 with R arm numbness and tingling and dragging her R foot since 04/13/2020.   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  Prior history of lacunar stroke in February 2019  as well as left frontal infarct from intracranial atherosclerosis in September 2021  CT head No acute abnormality. Small vessel disease. Old R periventricular white matter infarcts.   MRI  Acute L ACA infarcts. Stable small vessel disease.   MRA  Progressed severe L A2-3 stenosis; moderate L P2P + P3 and paraclinoid B ICA stenoses  Carotid Doppler  pending   2D Echo 01/2020 EF 55-60%. No source of embolus   OP cardiac monitor worn x 2 weeks. Awaiting formal report   LDL 48  HgbA1c 5.3  VTE prophylaxis - Lovenox 40 mg sq daily   clopidogrel 75 mg daily prior to admission, now on aspirin 325 mg daily and clopidogrel 75 mg daily. P2Y12 of 4 shows plavix function. Changed to aspirin 81 and brilinta 90 bid. Continue for at least 30 days. If able to afford long term, continue Brilinta 90 bid alone   Therapy recommendations:  CIR  Disposition:  pending   Hypertension  Stable . Permissive hypertension (OK if < 220/120) but gradually normalize in 5-7 days . Long-term BP goal normotensive  Hyperlipidemia  Home meds:   lipitor 80, resumed in hospital  LDL 48, goal < 70  Continue statin at discharge  Other Stroke Risk Factors  Advanced Age >/= 53   Obesity, Body mass index is 30.91 kg/m., BMI >/= 30 associated with increased stroke risk, recommend weight loss, diet and exercise as appropriate   Hx stroke/TIA  01/2020 - L frontal frontal strokes likely from stenosis in the L M2 those are all in same vascular territory and are not embolic source  07/7900 -  R MCA basal ganglia infarcts secondary to small vessel disease source. Enrolled in [employer] trial  Family hx stroke (Paternal Grandfather)  Obstructive sleep apnea, on CPAP at home  Hx small AAA on imaging  Other Active Problems  Elevated hgb 16.7  Hospital day # 2 Continue mobilize out of bed.  Ongoing therapies.  Transfer to inpatient rehab when bed available.  Recommend aspirin and Brilinta for 3 months followed by Brilinta alone if affordable.  Long discussion with patient and Dr. Reesa Chew.  Stroke team will sign off.  Follow-up as an outpatient in the stroke clinic in 6 weeks Antony Contras, MD To contact Stroke Continuity provider, please refer to [email or web address] After hours, contact General Neurology

## 2020-04-21 DIAGNOSIS — I639 Cerebral infarction, unspecified: Secondary | ICD-10-CM | POA: Diagnosis not present

## 2020-04-21 LAB — BASIC METABOLIC PANEL
Anion gap: 8 (ref 5–15)
BUN: 5 mg/dL — ABNORMAL LOW (ref 8–23)
CO2: 22 mmol/L (ref 22–32)
Calcium: 9.6 mg/dL (ref 8.9–10.3)
Chloride: 111 mmol/L (ref 98–111)
Creatinine, Ser: 0.74 mg/dL (ref 0.44–1.00)
GFR, Estimated: >60 mL/min (ref >60)
Glucose, Bld: 112 mg/dL — ABNORMAL HIGH (ref 70–99)
Potassium: 3.9 mmol/L (ref 3.5–5.1)
Sodium: 141 mmol/L (ref 135–145)

## 2020-04-21 LAB — CBC
HCT: 41.9 % (ref 36.0–46.0)
Hemoglobin: 13.9 g/dL (ref 12.0–15.0)
MCH: 30.5 pg (ref 26.0–34.0)
MCHC: 33.2 g/dL (ref 30.0–36.0)
MCV: 92.1 fL (ref 80.0–100.0)
Platelets: 228 10*3/uL (ref 150–400)
RBC: 4.55 MIL/uL (ref 3.87–5.11)
RDW: 12.8 % (ref 11.5–15.5)
WBC: 6.3 10*3/uL (ref 4.0–10.5)
nRBC: 0 % (ref 0.0–0.2)

## 2020-04-21 LAB — MAGNESIUM: Magnesium: 1.9 mg/dL (ref 1.7–2.4)

## 2020-04-21 MED ORDER — ATENOLOL 25 MG PO TABS
50.0000 mg | ORAL_TABLET | Freq: Two times a day (BID) | ORAL | Status: DC
  Administered 2020-04-21 – 2020-04-23 (×5): 50 mg via ORAL
  Filled 2020-04-21 (×5): qty 2

## 2020-04-21 NOTE — Progress Notes (Signed)
Physical Therapy Treatment Patient Details Name: [name] [name] MRN: [id number] DOB: May 17, 1941 Today's Date: 04/21/2020    History of Present Illness Pt is a 79 year old female who presented with intermittent R arm and leg numbness/tingling and weakness that have lasted 2-3 days. Her daughter also reported the pt has been "out of it" and not herself the past few days. 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 She was not administered tPA. NIHSS = 0. 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 PMH: CVAs, sleep apnea, pneumonia, obesity, IBS, HTN, depression, and arthritis.    PT Comments    Pt supine in bed on arrival.  Pt soiled in bed from faulty purewick.  Pt continues to present with weakness in B LE R > L.  Focused on strengthening and gt training.  She lives home alone but daughter works from home and can be with her 24 hrs a day.  She remains an excellent candidate for aggressive rehab to be able to return home alone and independent.    Follow Up Recommendations  CIR;Supervision for mobility/OOB     Equipment Recommendations  None recommended by PT    Recommendations for Other Services Rehab consult     Precautions / Restrictions Precautions Precautions: Fall Restrictions Weight Bearing Restrictions: No    Mobility  Bed Mobility Overal bed mobility: Needs Assistance Bed Mobility: Supine to Sit;Sit to Supine     Supine to sit: Supervision     General bed mobility comments: Increased time.  Transfers Overall transfer level: Needs assistance Equipment used: Rolling walker (2 wheeled) Transfers: Sit to/from Stand Sit to Stand: Min assist         General transfer comment: Min assistance to rise into standing.  Mild LOB.  Ambulation/Gait Ambulation/Gait assistance: Min assist Gait  Distance (Feet): 60 Feet (x2 trials) Assistive device: Rolling walker (2 wheeled) Gait Pattern/deviations: Step-through pattern;Decreased stride length;Narrow base of support;Trunk flexed Gait velocity: decreased   General Gait Details: Pt required assistance to maintain position in RW and increase BOS.  R LE remains ER but patient more midful to correct once given cues.   Stairs Stairs: Yes Stairs assistance: Min assist Stair Management: Two rails;Step to pattern Number of Stairs: 2 General stair comments: Extra time and minA to maintain balance, cuing pt to sequence steps.   Wheelchair Mobility    Modified Rankin (Stroke Patients Only) Modified Rankin (Stroke Patients Only) Pre-Morbid Rankin Score: No significant disability Modified Rankin: Moderately severe disability     Balance Overall balance assessment: Needs assistance Sitting-balance support: No upper extremity supported;Feet supported Sitting balance-Leahy Scale: Good       Standing balance-Leahy Scale: Poor                              Cognition Arousal/Alertness: Awake/alert Behavior During Therapy: WFL for tasks assessed/performed Overall Cognitive Status: Impaired/Different from baseline Area of Impairment: Attention;Safety/judgement;Awareness;Problem solving                   Current Attention Level: Sustained     Safety/Judgement: Decreased awareness of safety;Decreased awareness of deficits Awareness: Emergent Problem Solving: Difficulty sequencing;Requires verbal cues;Slow processing General Comments: Pt with decreased awareness of deficits and requires cues to make pt aware  of possible safety concerns and extra time to process and correct.      Exercises Other Exercises Other Exercises: B step ups to 6 inch steps with B rail support.  Noted R quad weakness.    General Comments        Pertinent Vitals/Pain Pain Assessment: No/denies pain    Home Living     Available  Help at Discharge: Family;Available 24 hours/day Type of Home: Other(Comment) (condo)              Prior Function            PT Goals (current goals can now be found in the care plan section) Acute Rehab PT Goals Patient Stated Goal: To get cleaned up Potential to Achieve Goals: Good Progress towards PT goals: Progressing toward goals    Frequency    Min 4X/week      PT Plan Current plan remains appropriate    Co-evaluation              AM-PAC PT "6 Clicks" Mobility   Outcome Measure  Help needed turning from your back to your side while in a flat bed without using bedrails?: None Help needed moving from lying on your back to sitting on the side of a flat bed without using bedrails?: None Help needed moving to and from a bed to a chair (including a wheelchair)?: A Little Help needed standing up from a chair using your arms (e.g., wheelchair or bedside chair)?: A Little Help needed to walk in hospital room?: A Little Help needed climbing 3-5 steps with a railing? : A Little 6 Click Score: 20    End of Session Equipment Utilized During Treatment: Gait belt Activity Tolerance: Patient tolerated treatment well Patient left: with call bell/phone within reach;in bed;with bed alarm set Nurse Communication: Mobility status;Other (comment) (cleaned patient of urinary incontinence and need for bed linen change/purewick) PT Visit Diagnosis: Unsteadiness on feet (R26.81);Other abnormalities of gait and mobility (R26.89);Muscle weakness (generalized) (M62.81);History of falling (Z91.81);Difficulty in walking, not elsewhere classified (R26.2);Other symptoms and signs involving the nervous system (R29.898);Hemiplegia and hemiparesis Hemiplegia - Right/Left:  (B from prior CVAs and cute CVA) Hemiplegia - dominant/non-dominant:  (bilat) Hemiplegia - caused by: Cerebral infarction     Time: 7846-9629 PT Time Calculation (min) (ACUTE ONLY): 38 min  Charges:  $Gait Training:  8-22 mins $Therapeutic Exercise: 8-22 mins $Therapeutic Activity: 8-22 mins                     Erasmo Leventhal , PTA Acute Rehabilitation Services Pager [phone] Office [phone]     Abundio Teuscher Eli Hose 04/21/2020, 5:23 PM

## 2020-04-21 NOTE — Progress Notes (Signed)
Inpatient Rehab Admissions Coordinator:   Met with patient at bedside to discuss potential CIR admission. Pt. Stated interest. Will pursue for potential admit this week, pending bed availability and insurance approval.  Clemens Catholic, [place], [place] Admissions Coordinator  [phone] (celll) [phone] (office)

## 2020-04-21 NOTE — Plan of Care (Signed)
  Problem: Education: Goal: Knowledge of disease or condition will improve Outcome: Progressing Goal: Knowledge of secondary prevention will improve Outcome: Progressing Goal: Knowledge of patient specific risk factors addressed and post discharge goals established will improve Outcome: Progressing   Problem: Coping: Goal: Will verbalize positive feelings about self Outcome: Progressing Goal: Will identify appropriate support needs Outcome: Progressing   Problem: Ischemic Stroke/TIA Tissue Perfusion: Goal: Complications of ischemic stroke/TIA will be minimized Outcome: Progressing   Problem: Intracerebral Hemorrhage Tissue Perfusion: Goal: Complications of Intracerebral Hemorrhage will be minimized Outcome: Progressing   Problem: Spontaneous Subarachnoid Hemorrhage Tissue Perfusion: Goal: Complications of Spontaneous Subarachnoid Hemorrhage will be minimized Outcome: Progressing

## 2020-04-21 NOTE — Progress Notes (Signed)
PROGRESS NOTE    [name] [name]  DUK:[id number] DOB: 1941-05-04 DOA: 04/18/2020 PCP: Tonia Ghent, MD   Brief Narrative:  79 year old with history of obstructive sleep apnea on CPAP, HTN, HLD, CVA in 2021 February and September presenting with right-sided arm numbness.  MRI brain confirmed acute left ACA territory infarcts.  Follows outpatient cardiology for A. fib.  Per neurology patient will require aspirin and Brilinta for at least 3 months and then Brilinta alone if affordable.  PT recommended CIR therefore arrangements be made.   Assessment & Plan:   Principal Problem:   Acute CVA (cerebrovascular accident) ([place]) Active Problems:   HYPERCHOLESTEROLEMIA   Essential hypertension   Sleep apnea   Class 1 obesity due to excess calories with body mass index (BMI) of 30.0 to 30.9 in adult   Acute left ACA territory infarct -Stroke protocol -Permissive hypertension at this point  -MRI brain positive for left ACA CVA -MRA head without contrast-severe stenosis of left anterior circulating area, moderate stenosis of P2 P.  Defer the need for vascular evaluation to neurology -Patient will need to be on aspirin and Brilinta for 3 months minimum and Brilinta thereafter if affordable. -No repeat echocardiogram needed, done recently -LDL 48 -Frequent neuro checks -Lipitor 40 mg daily -Risk factor modification -Neurology following -PT/OT eval-CIR, rehab team consulted  Essential hypertension -Start her atenolol 50 mg twice daily.  Supportive care.  Hyperlipidemia -Lipitor daily  Obstructive sleep apnea -CPAP  Obesity with BMI greater than 25 -Weight loss diet and exercise  DVT prophylaxis: Lovenox Code Status: Full code Family Communication: Manuela Schwartz has been updated, I told her I will be calling her back if there is any further medical changes otherwise plan is for CIR.  If she has any questions I will be available to answer them  Status is: Inpatient  Remains inpatient  appropriate because:Inpatient level of care appropriate due to severity of illness   Dispo: The patient is from: Home              Anticipated d/c is to: CIR              Anticipated d/c date is: 1 day              Patient currently stable for CIR   Body mass index is 30.91 kg/m.     Subjective: Some headache, no other complaints,    Examination: Constitutional: Not in acute distress Respiratory: Clear to auscultation bilaterally Cardiovascular: Normal sinus rhythm, no rubs Abdomen: Nontender nondistended good bowel sounds Musculoskeletal: No edema noted Skin: No rashes seen Neurologic: CN 2-12 grossly intact.  And nonfocal Psychiatric: Normal judgment and insight. Alert and oriented x 3. Normal mood.    Objective: Vitals:   04/20/20 2011 04/20/20 2310 04/21/20 0400 04/21/20 0809  BP: (!) 145/93 137/79 (!) 150/85 (!) 155/82  Pulse: 96 82 (!) 104 85  Resp: 18 16 18 18   Temp: 98.2 F (36.8 C) 97.7 F (36.5 C) 97.7 F (36.5 C) 98 F (36.7 C)  TempSrc: Oral Oral Oral Oral  SpO2: 95% 95% 97% 99%  Weight:      Height:        Intake/Output Summary (Last 24 hours) at 04/21/2020 0811 Last data filed at 04/21/2020 0500 Gross per 24 hour  Intake 300 ml  Output 1315 ml  Net -1015 ml   Filed Weights   04/18/20 0941  Weight: 76.7 kg     Data Reviewed:   CBC: Recent Labs  Lab 04/18/20 0943 04/18/20 0955 04/20/20 0407 04/21/20 0255  WBC 7.3  --  5.9 6.3  NEUTROABS 5.7  --   --   --   HGB 15.9* 16.7* 13.5 13.9  HCT 48.7* 49.0* 41.3 41.9  MCV 95.1  --  93.2 92.1  PLT 293  --  201 081   Basic Metabolic Panel: Recent Labs  Lab 04/18/20 0943 04/18/20 0955 04/20/20 0407 04/21/20 0255  NA 140 141 141 141  K 3.9 4.1 3.6 3.9  CL 105 104 108 111  CO2 24  --  21* 22  GLUCOSE 114* 108* 110* 112*  BUN 10 11 9  5*  CREATININE 0.98 0.80 0.82 0.74  CALCIUM 10.2  --  9.6 9.6  MG  --   --  2.0 1.9   GFR: Estimated Creatinine Clearance: 56.4 mL/min (by C-G  formula based on SCr of 0.74 mg/dL). Liver Function Tests: Recent Labs  Lab 04/18/20 0943  AST 30  ALT 21  ALKPHOS 104  BILITOT 1.2  PROT 6.7  ALBUMIN 4.3   Recent Labs  Lab 04/18/20 1124  LIPASE 39   No results for input(s): AMMONIA in the last 168 hours. Coagulation Profile: Recent Labs  Lab 04/18/20 0943  INR 1.0   Cardiac Enzymes: No results for input(s): CKTOTAL, CKMB, CKMBINDEX, TROPONINI in the last 168 hours. BNP (last 3 results) No results for input(s): PROBNP in the last 8760 hours. HbA1C: Recent Labs    04/19/20 0933  HGBA1C 5.3   CBG: Recent Labs  Lab 04/18/20 0937  GLUCAP 113*   Lipid Profile: Recent Labs    04/19/20 0253  CHOL 95  HDL 26*  LDLCALC 48  TRIG 103  CHOLHDL 3.7   Thyroid Function Tests: Recent Labs    04/19/20 0933  TSH 1.235   Anemia Panel: No results for input(s): VITAMINB12, FOLATE, FERRITIN, TIBC, IRON, RETICCTPCT in the last 72 hours. Sepsis Labs: No results for input(s): PROCALCITON, LATICACIDVEN in the last 168 hours.  Recent Results (from the past 240 hour(s))  Urine culture     Status: None   Collection Time: 04/18/20 11:42 AM   Specimen: Urine, Clean Catch  Result Value Ref Range Status   Specimen Description URINE, CLEAN CATCH  Final   Special Requests NONE  Final   Culture   Final    NO GROWTH Performed at [facility], [address], [place], [place] [address]    Report Status 04/19/2020 FINAL  Final  Resp Panel by RT-PCR (Flu A&B, Covid) Nasopharyngeal Swab     Status: None   Collection Time: 04/18/20  5:09 PM   Specimen: Nasopharyngeal Swab; Nasopharyngeal(NP) swabs in vial transport medium  Result Value Ref Range Status   SARS Coronavirus 2 by RT PCR NEGATIVE NEGATIVE Final    Comment: (NOTE) SARS-CoV-2 target nucleic acids are NOT DETECTED.  The SARS-CoV-2 RNA is generally detectable in upper respiratory specimens during the acute phase of infection. The lowest concentration of  SARS-CoV-2 viral copies this assay can detect is 138 copies/mL. A negative result does not preclude SARS-Cov-2 infection and should not be used as the sole basis for treatment or other patient management decisions. A negative result may occur with  improper specimen collection/handling, submission of specimen other than nasopharyngeal swab, presence of viral mutation(s) within the areas targeted by this assay, and inadequate number of viral copies(<138 copies/mL). A negative result must be combined with clinical observations, patient history, and epidemiological information. The expected result is  Negative.  Fact Sheet for Patients:  EntrepreneurPulse.com.au  Fact Sheet for Healthcare Providers:  IncredibleEmployment.be  This test is no t yet approved or cleared by the [place] FDA and  has been authorized for detection and/or diagnosis of SARS-CoV-2 by FDA under an Emergency Use Authorization (EUA). This EUA will remain  in effect (meaning this test can be used) for the duration of the COVID-19 declaration under Section 564(b)(1) of the Act, 21 U.S.C.section 360bbb-3(b)(1), unless the authorization is terminated  or revoked sooner.       Influenza A by PCR NEGATIVE NEGATIVE Final   Influenza B by PCR NEGATIVE NEGATIVE Final    Comment: (NOTE) The Xpert Xpress SARS-CoV-2/FLU/RSV plus assay is intended as an aid in the diagnosis of influenza from Nasopharyngeal swab specimens and should not be used as a sole basis for treatment. Nasal washings and aspirates are unacceptable for Xpert Xpress SARS-CoV-2/FLU/RSV testing.  Fact Sheet for Patients: EntrepreneurPulse.com.au  Fact Sheet for Healthcare Providers: IncredibleEmployment.be  This test is not yet approved or cleared by the [place] FDA and has been authorized for detection and/or diagnosis of SARS-CoV-2 by FDA under an Emergency Use  Authorization (EUA). This EUA will remain in effect (meaning this test can be used) for the duration of the COVID-19 declaration under Section 564(b)(1) of the Act, 21 U.S.C. section 360bbb-3(b)(1), unless the authorization is terminated or revoked.  Performed at [facility], [place] [address] [place], [place] [address]          Radiology Studies: VAS US CAROTID  Result Date: 04/20/2020 Carotid Arterial Duplex Study Indications:       Left ACA territory strokes. Risk Factors:      Hypertension, hyperlipidemia. Comparison Study:  11/26/2012- carotid artery duplex Performing Technologist: Maudry Mayhew MHA, RDMS, RVT, RDCS  Examination Guidelines: A complete evaluation includes B-mode imaging, spectral Doppler, color Doppler, and power Doppler as needed of all accessible portions of each vessel. Bilateral testing is considered an integral part of a complete examination. Limited examinations for reoccurring indications may be performed as noted.  Right Carotid Findings: +----------+--------+--------+--------+------------------+--------+           PSV cm/sEDV cm/sStenosisPlaque DescriptionComments +----------+--------+--------+--------+------------------+--------+ CCA Prox  85      15                                         +----------+--------+--------+--------+------------------+--------+ CCA Distal59      19                                         +----------+--------+--------+--------+------------------+--------+ ICA Prox  54      17                                         +----------+--------+--------+--------+------------------+--------+ ICA Distal66      13                                         +----------+--------+--------+--------+------------------+--------+ ECA       80      15                                         +----------+--------+--------+--------+------------------+--------+  +----------+--------+-------+----------------+-------------------+  PSV cm/sEDV cmsDescribe        Arm Pressure (mmHG) +----------+--------+-------+----------------+-------------------+ [id number]             Multiphasic, WNL                    +----------+--------+-------+----------------+-------------------+ +---------+--------+--+--------+--+---------+ VertebralPSV cm/s42EDV cm/s14Antegrade +---------+--------+--+--------+--+---------+  Left Carotid Findings: +----------+--------+--------+--------+-----------------------+--------+           PSV cm/sEDV cm/sStenosisPlaque Description     Comments +----------+--------+--------+--------+-----------------------+--------+ CCA Prox  70      14                                              +----------+--------+--------+--------+-----------------------+--------+ CCA Distal62      17              smooth and heterogenous         +----------+--------+--------+--------+-----------------------+--------+ ICA Prox  63      18              smooth and heterogenous         +----------+--------+--------+--------+-----------------------+--------+ ICA Distal67      23                                              +----------+--------+--------+--------+-----------------------+--------+ ECA       86      13                                              +----------+--------+--------+--------+-----------------------+--------+ +----------+--------+--------+----------------+-------------------+           PSV cm/sEDV cm/sDescribe        Arm Pressure (mmHG) +----------+--------+--------+----------------+-------------------+ Subclavian101             Multiphasic, WNL                    +----------+--------+--------+----------------+-------------------+ +---------+--------+--+--------+--+---------+ VertebralPSV cm/s51EDV cm/s13Antegrade +---------+--------+--+--------+--+---------+   Summary: Right Carotid:  Velocities in the right ICA are consistent with a 1-39% stenosis. Left Carotid: Velocities in the left ICA are consistent with a 1-39% stenosis. Vertebrals:  Bilateral vertebral arteries demonstrate antegrade flow. Subclavians: Normal flow hemodynamics were seen in bilateral subclavian              arteries. *See table(s) above for measurements and observations.     Preliminary         Scheduled Meds: . atorvastatin  80 mg Oral QPM  . enoxaparin (LOVENOX) injection  40 mg Subcutaneous Q24H  . ipratropium  1 spray Each Nare BID  . montelukast  10 mg Oral QHS  . ticagrelor  90 mg Oral BID   Continuous Infusions: . sodium chloride 50 mL/hr at 04/18/20 1759     LOS: 3 days   Time spent= 35 mins    Imogen Maddalena Arsenio Loader, MD [facility]  If 7PM-7AM, please contact night-coverage  04/21/2020, 8:11 AM

## 2020-04-21 NOTE — Evaluation (Signed)
Speech Language Pathology Evaluation Patient Details Name: [name] [name] MRN: [id number] DOB: 1941-05-16 Today's Date: 04/21/2020 Time: 4128-7867 SLP Time Calculation (min) (ACUTE ONLY): 21 min  Problem List:  Patient Active Problem List   Diagnosis Date Noted   Acute CVA (cerebrovascular accident) ([place]) 04/18/2020   Class 1 obesity due to excess calories with body mass index (BMI) of 30.0 to 30.9 in adult 04/18/2020   Seasonal and perennial allergic rhinitis 03/28/2020   HLD (hyperlipidemia) 02/16/2020   Stroke ([place]) 02/16/2020   Fall 02/16/2020   Abdominal wall hernia 10/30/2019   Change in voice 10/30/2019   History of stroke 09/14/2019   Spinal stenosis 07/24/2019   Mixed hyperlipidemia    OSA (obstructive sleep apnea)    Dysfunction of eustachian tube 05/15/2019   Other social stressor 03/29/2019   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   Diarrhea 06/20/2018   Joint pain 06/20/2018   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   Medicare annual wellness visit, subsequent 09/15/2017   SUI (stress urinary incontinence, female) 08/07/2017   Back pain 06/16/2016   Advance care planning 01/07/2016   Sleep apnea    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   Allergic rhinitis 11/24/2014   Adult BMI 30+ 11/24/2014   Depression 11/24/2014   Insomnia 08/23/2009   Headache, migraine 05/10/2009   HERPES SIMPLEX INFECTION 08/14/2006   HYPERCHOLESTEROLEMIA 08/14/2006   Anxiety state 08/14/2006   Essential hypertension 08/14/2006   HIATAL HERNIA 08/14/2006   IRRITABLE BOWEL SYNDROME 08/14/2006   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   Primary osteoarthritis of right knee 08/14/2006   PLANTAR FASCIITIS 08/14/2006   Past Medical History:  Past Medical History:  Diagnosis Date   Arthritis    knees   Depression    Fatty liver    GERD (gastroesophageal reflux disease)    Hyperlipidemia     Hypertension    IBS (irritable bowel syndrome)    Obesity    Pneumonia    Sleep apnea    uses CPAP   Stroke (cerebrum) [facility])    Past Surgical History:  Past Surgical History:  Procedure Laterality Date   BLADDER SURGERY     BREAST SURGERY     breast biopsy   BROW LIFT Bilateral 04/14/2017   Procedure: BLEPHAROPLASTY UPPER EYELID WITH EXCESS SKIN;  Surgeon: Karle Starch, MD;  Location: [place];  Service: Ophthalmology;  Laterality: Bilateral;   CATARACT EXTRACTION W/PHACO Left 02/05/2016   Procedure: CATARACT EXTRACTION PHACO AND INTRAOCULAR LENS PLACEMENT ([place]);  Surgeon: Birder Robson, MD;  Location: [facility];  Service: Ophthalmology;  Laterality: Left;  [place] 01:10 AP% 22.3 CDE 15.67 Fluid pack lot # [id number]   CATARACT EXTRACTION W/PHACO Right 02/26/2016   Procedure: CATARACT EXTRACTION PHACO AND INTRAOCULAR LENS PLACEMENT (I[facility]);  Surgeon: Birder Robson, MD;  Location: [facility];  Service: Ophthalmology;  Laterality: Right;  [place] 57.4 AP% 24.0 CDE 13.75 Fluid Pack lot # [id number]   CHOLECYSTECTOMY     COLONOSCOPY WITH PROPOFOL N/A 12/18/2014   Procedure: COLONOSCOPY WITH PROPOFOL;  Surgeon: Manya Silvas, MD;  Location: [facility];  Service: Endoscopy;  Laterality: N/A;   DEEP NECK LYMPH NODE BIOPSY / EXCISION     JOINT REPLACEMENT     KNEE ARTHROPLASTY Left 06/20/2015   Procedure: COMPUTER ASSISTED TOTAL KNEE ARTHROPLASTY;  Surgeon: Dereck Leep, MD;  Location: [facility];  Service: Orthopedics;  Laterality: Left;   PTOSIS REPAIR Bilateral 04/14/2017   Procedure: PTOSIS REPAIR RESECT EX;  Surgeon:  Karle Starch, MD;  Location: [facility];  Service: Ophthalmology;  Laterality: Bilateral;  sleep apnea   TONSILLECTOMY     TOTAL HIP ARTHROPLASTY Right 07/21/2018   Procedure: TOTAL HIP ARTHROPLASTY ANTERIOR APPROACH;  Surgeon: Gaynelle Arabian, MD;  Location: [facility];  Service: Orthopedics;  Laterality: Right;   HPI:  Pt is a 79 year old  female who presented with intermittent R arm and leg numbness/tingling and weakness that have lasted 2-3 days. Her daughter also reported the pt has been "out of it" and not herself the past few days. MRI revealed acute L ACA territory infarcts. She was not administered tPA. NIHSS = 0. 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 PMH: CVAs, sleep apnea, pneumonia, obesity, IBS, HTN, depression, and arthritis.   Assessment / Plan / Recommendation Clinical Impression  Pt exhibits a mild cognitive impairment indicated by results on the SLUMS examination. Her retrieval of multiple componenets of information is decreased and requires repetition. Did not receive full credit on divergent naming task and mental calculation. Pt is responsible for organizing her monthly finances and medication (uses pill box). States she does not use strategies to assist with prospective memory. Speech is intelligible and her language intact. She could benefit from continued ST on next venue to care recommended which is CIR.       SLP Assessment  SLP Recommendation/Assessment: All further Speech Lanaguage Pathology  needs can be addressed in the next venue of care SLP Visit Diagnosis: Cognitive communication deficit (R41.841)    Follow Up Recommendations  Inpatient Rehab    Frequency and Duration           SLP Evaluation Cognition  Overall Cognitive Status: Impaired/Different from baseline Arousal/Alertness: Awake/alert Orientation Level: Oriented X4 Attention: Sustained Sustained Attention: Appears intact Memory: Impaired Memory Impairment: Retrieval deficit Awareness: Impaired Awareness Impairment: Anticipatory impairment Problem Solving:  (intact for verbal ) Executive Function: Self Monitoring;Organizing Safety/Judgment:  (?)       Comprehension  Auditory Comprehension Overall Auditory Comprehension: Appears within functional limits for tasks assessed Visual  Recognition/Discrimination Discrimination: Not tested Reading Comprehension Reading Status: Not tested    Expression Expression Primary Mode of Expression: Verbal Verbal Expression Overall Verbal Expression: Appears within functional limits for tasks assessed Written Expression Dominant Hand: Right Written Expression: Not tested   Oral / Motor  Oral Motor/Sensory Function Overall Oral Motor/Sensory Function: Within functional limits Motor Speech Overall Motor Speech: Appears within functional limits for tasks assessed Intelligibility: Intelligible Motor Planning: Witnin functional limits   GO                    Houston Siren 04/21/2020, 4:38 PM  Orbie Pyo [place] M.Ed Risk analyst [phone] Office [phone]

## 2020-04-22 DIAGNOSIS — I639 Cerebral infarction, unspecified: Secondary | ICD-10-CM | POA: Diagnosis not present

## 2020-04-22 LAB — CBC
HCT: 42.7 % (ref 36.0–46.0)
Hemoglobin: 14.1 g/dL (ref 12.0–15.0)
MCH: 30.3 pg (ref 26.0–34.0)
MCHC: 33 g/dL (ref 30.0–36.0)
MCV: 91.8 fL (ref 80.0–100.0)
Platelets: 225 10*3/uL (ref 150–400)
RBC: 4.65 MIL/uL (ref 3.87–5.11)
RDW: 12.7 % (ref 11.5–15.5)
WBC: 6.1 10*3/uL (ref 4.0–10.5)
nRBC: 0 % (ref 0.0–0.2)

## 2020-04-22 LAB — BASIC METABOLIC PANEL
Anion gap: 10 (ref 5–15)
BUN: 9 mg/dL (ref 8–23)
CO2: 22 mmol/L (ref 22–32)
Calcium: 9.5 mg/dL (ref 8.9–10.3)
Chloride: 108 mmol/L (ref 98–111)
Creatinine, Ser: 0.82 mg/dL (ref 0.44–1.00)
GFR, Estimated: >60 mL/min (ref >60)
Glucose, Bld: 111 mg/dL — ABNORMAL HIGH (ref 70–99)
Potassium: 3.7 mmol/L (ref 3.5–5.1)
Sodium: 140 mmol/L (ref 135–145)

## 2020-04-22 LAB — MAGNESIUM: Magnesium: 1.9 mg/dL (ref 1.7–2.4)

## 2020-04-22 NOTE — Plan of Care (Signed)
  Problem: Coping: Goal: Will verbalize positive feelings about self Outcome: Progressing   Problem: Education: Goal: Knowledge of patient specific risk factors addressed and post discharge goals established will improve Outcome: Progressing   Problem: Education: Goal: Knowledge of secondary prevention will improve Outcome: Progressing   Problem: Education: Goal: Knowledge of disease or condition will improve Outcome: Progressing   

## 2020-04-22 NOTE — Progress Notes (Signed)
PROGRESS NOTE    [name] [name]  LFY:[id number] DOB: Jan 08, 1941 DOA: 04/18/2020 PCP: Tonia Ghent, MD   Brief Narrative:  79 year old with history of obstructive sleep apnea on CPAP, HTN, HLD, CVA in 2021 February and September presenting with right-sided arm numbness.  MRI brain confirmed acute left ACA territory infarcts.  Follows outpatient cardiology for A. fib.  Per neurology patient will require aspirin and Brilinta for at least 3 months and then Brilinta alone if affordable.  PT recommended CIR therefore arrangements be made.   Assessment & Plan:   Principal Problem:   Acute CVA (cerebrovascular accident) ([place]) Active Problems:   HYPERCHOLESTEROLEMIA   Essential hypertension   Sleep apnea   Class 1 obesity due to excess calories with body mass index (BMI) of 30.0 to 30.9 in adult   Acute left ACA territory infarct -Stroke protocol -Permissive hypertension at this point  -MRI brain positive for left ACA CVA -MRA head without contrast-severe stenosis of left anterior circulating area, moderate stenosis of P2 P.  Defer the need for vascular evaluation to neurology -Patient will need to be on aspirin and Brilinta for 3 months minimum and Brilinta thereafter if affordable. -No repeat echocardiogram needed, done recently -LDL 48 -Frequent neuro checks -Lipitor 40 mg daily -Risk factor modification -Neurology following -PT/OT eval-CIR, rehab team consulted  Essential hypertension -Start her atenolol 50 mg twice daily.  Supportive care.  Hyperlipidemia -Lipitor daily  Obstructive sleep apnea -CPAP  Obesity with BMI greater than 25 -Weight loss diet and exercise  DVT prophylaxis: Lovenox Code Status: Full code Family Communication: Manuela Schwartz has been updated, I told her I will be calling her back if there is any further medical changes otherwise plan is for CIR.  If she has any questions I will be available to answer them  Status is: Inpatient  Remains inpatient  appropriate because:Inpatient level of care appropriate due to severity of illness   Dispo: The patient is from: Home              Anticipated d/c is to: CIR              Anticipated d/c date is: 1 day              Patient currently stable for CIR, awaiting placement   Body mass index is 30.91 kg/m.     Subjective: Feels okay no complaints   Examination: Constitutional: Not in acute distress Respiratory: Clear to auscultation bilaterally Cardiovascular: Normal sinus rhythm, no rubs Abdomen: Nontender nondistended good bowel sounds Musculoskeletal: No edema noted Skin: No rashes seen Neurologic: CN 2-12 grossly intact.  Some weakness in bilateral lower extremity 4/5, upper extremity 5/5 Psychiatric: Normal judgment and insight. Alert and oriented x 3. Normal mood.   Objective: Vitals:   04/21/20 1703 04/21/20 2354 04/22/20 0417 04/22/20 0808  BP: 138/88 129/87 (!) 152/83 (!) 143/90  Pulse: 68 70 75 77  Resp: 18 16 20 18   Temp: 98.2 F (36.8 C) 97.8 F (36.6 C) 97.6 F (36.4 C) 97.9 F (36.6 C)  TempSrc: Oral Oral Oral Oral  SpO2: 98% 98% 98% 98%  Weight:      Height:        Intake/Output Summary (Last 24 hours) at 04/22/2020 0847 Last data filed at 04/22/2020 0800 Gross per 24 hour  Intake 811.6 ml  Output 1350 ml  Net -538.4 ml   Filed Weights   04/18/20 0941  Weight: 76.7 kg     Data Reviewed:  CBC: Recent Labs  Lab 04/18/20 0943 04/18/20 0955 04/20/20 0407 04/21/20 0255 04/22/20 0315  WBC 7.3  --  5.9 6.3 6.1  NEUTROABS 5.7  --   --   --   --   HGB 15.9* 16.7* 13.5 13.9 14.1  HCT 48.7* 49.0* 41.3 41.9 42.7  MCV 95.1  --  93.2 92.1 91.8  PLT 293  --  201 228 751   Basic Metabolic Panel: Recent Labs  Lab 04/18/20 0943 04/18/20 0955 04/20/20 0407 04/21/20 0255 04/22/20 0315  NA 140 141 141 141 140  K 3.9 4.1 3.6 3.9 3.7  CL 105 104 108 111 108  CO2 24  --  21* 22 22  GLUCOSE 114* 108* 110* 112* 111*  BUN 10 11 9  5* 9   CREATININE 0.98 0.80 0.82 0.74 0.82  CALCIUM 10.2  --  9.6 9.6 9.5  MG  --   --  2.0 1.9 1.9   GFR: Estimated Creatinine Clearance: 55.1 mL/min (by C-G formula based on SCr of 0.82 mg/dL). Liver Function Tests: Recent Labs  Lab 04/18/20 0943  AST 30  ALT 21  ALKPHOS 104  BILITOT 1.2  PROT 6.7  ALBUMIN 4.3   Recent Labs  Lab 04/18/20 1124  LIPASE 39   No results for input(s): AMMONIA in the last 168 hours. Coagulation Profile: Recent Labs  Lab 04/18/20 0943  INR 1.0   Cardiac Enzymes: No results for input(s): CKTOTAL, CKMB, CKMBINDEX, TROPONINI in the last 168 hours. BNP (last 3 results) No results for input(s): PROBNP in the last 8760 hours. HbA1C: Recent Labs    04/19/20 0933  HGBA1C 5.3   CBG: Recent Labs  Lab 04/18/20 0937  GLUCAP 113*   Lipid Profile: No results for input(s): CHOL, HDL, LDLCALC, TRIG, CHOLHDL, LDLDIRECT in the last 72 hours. Thyroid Function Tests: Recent Labs    04/19/20 0933  TSH 1.235   Anemia Panel: No results for input(s): VITAMINB12, FOLATE, FERRITIN, TIBC, IRON, RETICCTPCT in the last 72 hours. Sepsis Labs: No results for input(s): PROCALCITON, LATICACIDVEN in the last 168 hours.  Recent Results (from the past 240 hour(s))  Urine culture     Status: None   Collection Time: 04/18/20 11:42 AM   Specimen: Urine, Clean Catch  Result Value Ref Range Status   Specimen Description URINE, CLEAN CATCH  Final   Special Requests NONE  Final   Culture   Final    NO GROWTH Performed at [facility], [address], [place], [place] [address]    Report Status 04/19/2020 FINAL  Final  Resp Panel by RT-PCR (Flu A&B, Covid) Nasopharyngeal Swab     Status: None   Collection Time: 04/18/20  5:09 PM   Specimen: Nasopharyngeal Swab; Nasopharyngeal(NP) swabs in vial transport medium  Result Value Ref Range Status   SARS Coronavirus 2 by RT PCR NEGATIVE NEGATIVE Final    Comment: (NOTE) SARS-CoV-2 target nucleic acids are NOT  DETECTED.  The SARS-CoV-2 RNA is generally detectable in upper respiratory specimens during the acute phase of infection. The lowest concentration of SARS-CoV-2 viral copies this assay can detect is 138 copies/mL. A negative result does not preclude SARS-Cov-2 infection and should not be used as the sole basis for treatment or other patient management decisions. A negative result may occur with  improper specimen collection/handling, submission of specimen other than nasopharyngeal swab, presence of viral mutation(s) within the areas targeted by this assay, and inadequate number of viral copies(<138 copies/mL). A negative  result must be combined with clinical observations, patient history, and epidemiological information. The expected result is Negative.  Fact Sheet for Patients:  EntrepreneurPulse.com.au  Fact Sheet for Healthcare Providers:  IncredibleEmployment.be  This test is no t yet approved or cleared by the [place] FDA and  has been authorized for detection and/or diagnosis of SARS-CoV-2 by FDA under an Emergency Use Authorization (EUA). This EUA will remain  in effect (meaning this test can be used) for the duration of the COVID-19 declaration under Section 564(b)(1) of the Act, 21 U.S.C.section 360bbb-3(b)(1), unless the authorization is terminated  or revoked sooner.       Influenza A by PCR NEGATIVE NEGATIVE Final   Influenza B by PCR NEGATIVE NEGATIVE Final    Comment: (NOTE) The Xpert Xpress SARS-CoV-2/FLU/RSV plus assay is intended as an aid in the diagnosis of influenza from Nasopharyngeal swab specimens and should not be used as a sole basis for treatment. Nasal washings and aspirates are unacceptable for Xpert Xpress SARS-CoV-2/FLU/RSV testing.  Fact Sheet for Patients: EntrepreneurPulse.com.au  Fact Sheet for Healthcare Providers: IncredibleEmployment.be  This test is not yet  approved or cleared by the [place] FDA and has been authorized for detection and/or diagnosis of SARS-CoV-2 by FDA under an Emergency Use Authorization (EUA). This EUA will remain in effect (meaning this test can be used) for the duration of the COVID-19 declaration under Section 564(b)(1) of the Act, 21 U.S.C. section 360bbb-3(b)(1), unless the authorization is terminated or revoked.  Performed at [facility], [place] [address], [place], [place] [address]          Radiology Studies: VAS US CAROTID  Result Date: 04/20/2020 Carotid Arterial Duplex Study Indications:       Left ACA territory strokes. Risk Factors:      Hypertension, hyperlipidemia. Comparison Study:  11/26/2012- carotid artery duplex Performing Technologist: Maudry Mayhew MHA, RDMS, RVT, RDCS  Examination Guidelines: A complete evaluation includes B-mode imaging, spectral Doppler, color Doppler, and power Doppler as needed of all accessible portions of each vessel. Bilateral testing is considered an integral part of a complete examination. Limited examinations for reoccurring indications may be performed as noted.  Right Carotid Findings: +----------+--------+--------+--------+------------------+--------+           PSV cm/sEDV cm/sStenosisPlaque DescriptionComments +----------+--------+--------+--------+------------------+--------+ CCA Prox  85      15                                         +----------+--------+--------+--------+------------------+--------+ CCA Distal59      19                                         +----------+--------+--------+--------+------------------+--------+ ICA Prox  54      17                                         +----------+--------+--------+--------+------------------+--------+ ICA Distal66      13                                         +----------+--------+--------+--------+------------------+--------+ ECA       80  15                                          +----------+--------+--------+--------+------------------+--------+ +----------+--------+-------+----------------+-------------------+           PSV cm/sEDV cmsDescribe        Arm Pressure (mmHG) +----------+--------+-------+----------------+-------------------+ [id number]             Multiphasic, WNL                    +----------+--------+-------+----------------+-------------------+ +---------+--------+--+--------+--+---------+ VertebralPSV cm/s42EDV cm/s14Antegrade +---------+--------+--+--------+--+---------+  Left Carotid Findings: +----------+--------+--------+--------+-----------------------+--------+           PSV cm/sEDV cm/sStenosisPlaque Description     Comments +----------+--------+--------+--------+-----------------------+--------+ CCA Prox  70      14                                              +----------+--------+--------+--------+-----------------------+--------+ CCA Distal62      17              smooth and heterogenous         +----------+--------+--------+--------+-----------------------+--------+ ICA Prox  63      18              smooth and heterogenous         +----------+--------+--------+--------+-----------------------+--------+ ICA Distal67      23                                              +----------+--------+--------+--------+-----------------------+--------+ ECA       86      13                                              +----------+--------+--------+--------+-----------------------+--------+ +----------+--------+--------+----------------+-------------------+           PSV cm/sEDV cm/sDescribe        Arm Pressure (mmHG) +----------+--------+--------+----------------+-------------------+ Subclavian101             Multiphasic, WNL                    +----------+--------+--------+----------------+-------------------+ +---------+--------+--+--------+--+---------+ VertebralPSV cm/s51EDV  cm/s13Antegrade +---------+--------+--+--------+--+---------+   Summary: Right Carotid: Velocities in the right ICA are consistent with a 1-39% stenosis. Left Carotid: Velocities in the left ICA are consistent with a 1-39% stenosis. Vertebrals:  Bilateral vertebral arteries demonstrate antegrade flow. Subclavians: Normal flow hemodynamics were seen in bilateral subclavian              arteries. *See table(s) above for measurements and observations.     Preliminary         Scheduled Meds: . atenolol  50 mg Oral BID  . atorvastatin  80 mg Oral QPM  . enoxaparin (LOVENOX) injection  40 mg Subcutaneous Q24H  . ipratropium  1 spray Each Nare BID  . montelukast  10 mg Oral QHS  . ticagrelor  90 mg Oral BID   Continuous Infusions: . sodium chloride 50 mL/hr at 04/22/20 0800     LOS: 4 days   Time spent= 35 mins    Jaser Fullen Chirag Conall Vangorder,  MD [facility]  If 7PM-7AM, please contact night-coverage  04/22/2020, 8:47 AM

## 2020-04-22 NOTE — Progress Notes (Signed)
Physical Therapy Treatment Patient Details Name: [name] [name] MRN: [id number] DOB: 1941-01-02 Today's Date: 04/22/2020    History of Present Illness Pt is a 79 year old female who presented with intermittent R arm and leg numbness/tingling and weakness that have lasted 2-3 days. Her daughter also reported the pt has been "out of it" and not herself the past few days. 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 She was not administered tPA. NIHSS = 0. 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 PMH: CVAs, sleep apnea, pneumonia, obesity, IBS, HTN, depression, and arthritis.    PT Comments    Patient progressing well towards PT goals. Reports feeling good today. Improved ambulation distance with use of RW for support. Continues to have RLE weakness however no buckling noted during walking today, just fatigue needing 1 standing rest break. Performed 5xSTS in 19.4 seconds (normative value for her age is 12.4 seconds) indicating decreased functional strength, impaired balance and fall risk. Discussed importance of equal WB through BLEs during transitions and functional use of RUE. Pt eager to get to rehab. Will follow.    Follow Up Recommendations  CIR;Supervision for mobility/OOB     Equipment Recommendations  None recommended by PT    Recommendations for Other Services       Precautions / Restrictions Precautions Precautions: Fall Restrictions Weight Bearing Restrictions: No    Mobility  Bed Mobility Overal bed mobility: Needs Assistance Bed Mobility: Supine to Sit     Supine to sit: Supervision;HOB elevated     General bed mobility comments: No assist needed.  Transfers Overall transfer level: Needs assistance Equipment used: Rolling walker (2 wheeled) Transfers: Sit to/from Stand Sit to  Stand: Min guard         General transfer comment: Min guard for safety. Stood from [employer], cues for equal WB through BLEs.  Ambulation/Gait Ambulation/Gait assistance: Min guard Gait Distance (Feet): 110 Feet Assistive device: Rolling walker (2 wheeled) Gait Pattern/deviations: Step-through pattern;Decreased stride length;Narrow base of support;Trunk flexed Gait velocity: decreased   General Gait Details: Slow, mostly steady gait with 1 standing rest break. RLE positioned better today with no knee buckling. 2/4 DOE. RLe fatigues.   Stairs             Wheelchair Mobility    Modified Rankin (Stroke Patients Only) Modified Rankin (Stroke Patients Only) Pre-Morbid Rankin Score: No significant disability Modified Rankin: Moderately severe disability     Balance Overall balance assessment: Needs assistance Sitting-balance support: Feet supported;No upper extremity supported Sitting balance-Leahy Scale: Good     Standing balance support: During functional activity Standing balance-Leahy Scale: Fair Standing balance comment: Reliant on UE support for balance during dynamic tasks                            Cognition Arousal/Alertness: Awake/alert Behavior During Therapy: WFL for tasks assessed/performed Overall Cognitive Status: Impaired/Different from baseline Area of Impairment: Awareness;Safety/judgement                         Safety/Judgement: Decreased awareness of safety;Decreased awareness of deficits Awareness: Emergent   General Comments: Pt with decreased awareness of deficits.      Exercises      General  Comments General comments (skin integrity, edema, etc.): Performed 5xSTS in 19.4 seconds (normative value for her age is 12.4 seconds) indicating decreased functional strength, impaired balance and fall risk.      Pertinent Vitals/Pain Pain Assessment: No/denies pain    Home Living                      Prior  Function            PT Goals (current goals can now be found in the care plan section) Progress towards PT goals: Progressing toward goals    Frequency    Min 4X/week      PT Plan Current plan remains appropriate    Co-evaluation              AM-PAC PT "6 Clicks" Mobility   Outcome Measure  Help needed turning from your back to your side while in a flat bed without using bedrails?: None Help needed moving from lying on your back to sitting on the side of a flat bed without using bedrails?: None Help needed moving to and from a bed to a chair (including a wheelchair)?: A Little Help needed standing up from a chair using your arms (e.g., wheelchair or bedside chair)?: A Little Help needed to walk in hospital room?: A Little Help needed climbing 3-5 steps with a railing? : A Little 6 Click Score: 20    End of Session Equipment Utilized During Treatment: Gait belt Activity Tolerance: Patient tolerated treatment well Patient left: in bed;with call bell/phone within reach;with bed alarm set Nurse Communication: Mobility status PT Visit Diagnosis: Unsteadiness on feet (R26.81);Other abnormalities of gait and mobility (R26.89);Muscle weakness (generalized) (M62.81);History of falling (Z91.81);Difficulty in walking, not elsewhere classified (R26.2);Other symptoms and signs involving the nervous system (R29.898);Hemiplegia and hemiparesis Hemiplegia - caused by: Cerebral infarction     Time: 1253-1320 PT Time Calculation (min) (ACUTE ONLY): 27 min  Charges:  $Gait Training: 8-22 mins $Therapeutic Activity: 8-22 mins                     Marisa Severin, PT, DPT Acute Rehabilitation Services Pager [phone] Office [place] 04/22/2020, 3:48 PM

## 2020-04-23 DIAGNOSIS — I639 Cerebral infarction, unspecified: Secondary | ICD-10-CM | POA: Diagnosis not present

## 2020-04-23 LAB — BASIC METABOLIC PANEL
Anion gap: 11 (ref 5–15)
BUN: 13 mg/dL (ref 8–23)
CO2: 21 mmol/L — ABNORMAL LOW (ref 22–32)
Calcium: 9.7 mg/dL (ref 8.9–10.3)
Chloride: 108 mmol/L (ref 98–111)
Creatinine, Ser: 0.85 mg/dL (ref 0.44–1.00)
GFR, Estimated: >60 mL/min (ref >60)
Glucose, Bld: 108 mg/dL — ABNORMAL HIGH (ref 70–99)
Potassium: 3.9 mmol/L (ref 3.5–5.1)
Sodium: 140 mmol/L (ref 135–145)

## 2020-04-23 LAB — CBC
HCT: 43.3 % (ref 36.0–46.0)
Hemoglobin: 14.3 g/dL (ref 12.0–15.0)
MCH: 30.6 pg (ref 26.0–34.0)
MCHC: 33 g/dL (ref 30.0–36.0)
MCV: 92.7 fL (ref 80.0–100.0)
Platelets: 233 10*3/uL (ref 150–400)
RBC: 4.67 MIL/uL (ref 3.87–5.11)
RDW: 12.9 % (ref 11.5–15.5)
WBC: 6.8 10*3/uL (ref 4.0–10.5)
nRBC: 0 % (ref 0.0–0.2)

## 2020-04-23 LAB — MAGNESIUM: Magnesium: 2 mg/dL (ref 1.7–2.4)

## 2020-04-23 MED ORDER — TICAGRELOR 90 MG PO TABS: 90.0000 mg | ORAL_TABLET | Freq: Two times a day (BID) | ORAL | Status: DC

## 2020-04-23 MED ORDER — TICAGRELOR 90 MG PO TABS: 90.0000 mg | ORAL_TABLET | Freq: Two times a day (BID) | ORAL | 0 refills | Status: DC

## 2020-04-23 MED ORDER — TICAGRELOR 90 MG PO TABS
90.0000 mg | ORAL_TABLET | Freq: Two times a day (BID) | ORAL | Status: DC
Start: 2020-04-23 — End: 2020-04-23

## 2020-04-23 NOTE — Progress Notes (Signed)
Inpatient Rehabilitation Admissions Coordinator  Noted patient has improved functionally since yesterday. Supervision level with PT. I met with patient at bedside to explain that she is doing well and I recommend outpatient therapy at this time. Patient denied by [facility] last admit at min guard assist level. I have updated [employer] this morning and expect a denial which I have informed patient.Patient states she prefers [facility] rather than outpatient due to transportation needs. [facility] than [facility]. I have informed RN CM, Claiborne Billings.  Danne Baxter, RN, MSN Rehab Admissions Coordinator [phone] 04/23/2020 10:31 AM

## 2020-04-23 NOTE — Care Management Important Message (Signed)
Important Message  Patient Details  Name: [name] [name] MRN: [id number] Date of Birth: [birth year]   Medicare Important Message Given:  Yes     Barb Merino Caulder Wehner 04/23/2020, 3:25 PM

## 2020-04-23 NOTE — Plan of Care (Signed)
  Problem: Education: Goal: Knowledge of disease or condition will improve Outcome: Adequate for Discharge Goal: Knowledge of secondary prevention will improve Outcome: Adequate for Discharge Goal: Knowledge of patient specific risk factors addressed and post discharge goals established will improve Outcome: Adequate for Discharge   Problem: Coping: Goal: Will verbalize positive feelings about self Outcome: Adequate for Discharge Goal: Will identify appropriate support needs Outcome: Adequate for Discharge   Problem: Health Behavior/Discharge Planning: Goal: Ability to manage health-related needs will improve Outcome: Adequate for Discharge   Problem: Self-Care: Goal: Ability to participate in self-care as condition permits will improve Outcome: Adequate for Discharge Goal: Verbalization of feelings and concerns over difficulty with self-care will improve Outcome: Adequate for Discharge Goal: Ability to communicate needs accurately will improve Outcome: Adequate for Discharge   Problem: Nutrition: Goal: Risk of aspiration will decrease Outcome: Adequate for Discharge Goal: Dietary intake will improve Outcome: Adequate for Discharge   Problem: Intracerebral Hemorrhage Tissue Perfusion: Goal: Complications of Intracerebral Hemorrhage will be minimized Outcome: Adequate for Discharge   Problem: Ischemic Stroke/TIA Tissue Perfusion: Goal: Complications of ischemic stroke/TIA will be minimized Outcome: Adequate for Discharge   Problem: Spontaneous Subarachnoid Hemorrhage Tissue Perfusion: Goal: Complications of Spontaneous Subarachnoid Hemorrhage will be minimized Outcome: Adequate for Discharge   

## 2020-04-23 NOTE — TOC Transition Note (Signed)
Transition of Care [facility]) - CM/SW Discharge Note   Patient Details  Name: [name] MRN: [id number] Date of Birth: 1940/06/13  Transition of Care [facility]) CM/SW Contact:  Pollie Friar, RN Phone Number: 04/23/2020, 11:18 AM   Clinical Narrative:    Pt is discharging home with [facility] services through [facility]. [name] with [facility] accepted the referral. Pt has all needed DME at home; rollator, shower seat, walker, 3 in 1. Pts family can provide supervision at home and transportation to home. CM provided pt with 30 day free brilinta card.    Final next level of care: Home w Home Health Services Barriers to Discharge: No Barriers Identified   Patient Goals and CMS Choice   CMS Medicare.gov Compare Post Acute Care list provided to:: Patient Choice offered to / list presented to : Patient  Discharge Placement                       Discharge Plan and Services                          HH Arranged: PT, OT, Speech Therapy HH Agency: [facility] Date [facility] Agency Contacted: 04/23/20   Representative spoke with at [place]: [place] ([place]) Interventions     Readmission Risk Interventions No flowsheet data found.

## 2020-04-23 NOTE — Discharge Summary (Addendum)
Physician Discharge Summary  [name] [name] TML:[id number] DOB: 01-05-1941 DOA: 04/18/2020  PCP: Tonia Ghent, MD  Admit date: 04/18/2020 Discharge date: 04/23/2020  Admitted From: Home Disposition: CIR or home if denied.   Recommendations for Outpatient Follow-up:  1. Follow up with PCP in 1-2 weeks 2. Please obtain BMP/CBC in one week your next doctors visit.  3. Plavix will be switched to Brilinta.  Patient will be on aspirin and Brilinta for 3 months thereafter Brilinta alone lifelong 4. Follow-up outpatient neurology in 3-4 weeks   Discharge Condition: Stable CODE STATUS: Full code Diet recommendation: Heart healthy  Brief/Interim Summary: 79 year old with history of obstructive sleep apnea on CPAP, HTN, HLD, CVA in 2021 February and September presenting with right-sided arm numbness.  MRI brain confirmed acute left ACA territory infarcts.  Follows outpatient cardiology for A. fib.  Per neurology patient will require aspirin and Brilinta for at least 3 months and then Brilinta alone if affordable.  PT recommended CIR therefore arrangements be made. [name] updated on the day of discharge.   Assessment & Plan:   Principal Problem:   Acute CVA (cerebrovascular accident) ([place]) Active Problems:   HYPERCHOLESTEROLEMIA   Essential hypertension   Sleep apnea   Class 1 obesity due to excess calories with body mass index (BMI) of 30.0 to 30.9 in adult   Acute left ACA territory infarct -MRI brain positive for left ACA CVA -MRA head without contrast-severe stenosis of left anterior circulating area, moderate stenosis of P2 P.  Defer the need for vascular evaluation to neurology -Patient will need to be on aspirin and Brilinta for 3 months minimum and Brilinta thereafter if affordable. -No repeat echocardiogram needed, done recently -LDL 48 -Frequent neuro checks -Lipitor 40 mg daily -Seen by neuro, PT recommended CIR.  Essential hypertension - atenolol 50 mg twice  daily.  Supportive care.  Hyperlipidemia -Lipitor daily  Obstructive sleep apnea -CPAP  Obesity with BMI greater than 25 -Weight loss diet and exercise  Body mass index is 30.91 kg/m.      Discharge Diagnoses:  Principal Problem:   Acute CVA (cerebrovascular accident) ([place]) Active Problems:   HYPERCHOLESTEROLEMIA   Essential hypertension   Sleep apnea   Class 1 obesity due to excess calories with body mass index (BMI) of 30.0 to 30.9 in adult   Subjective: Overall feels great no complaints  Discharge Exam: Vitals:   04/23/20 0443 04/23/20 0738  BP: (!) 153/87 139/79  Pulse: 73 68  Resp: 18 18  Temp: 97.8 F (36.6 C) 97.6 F (36.4 C)  SpO2: 97% 98%   Vitals:   04/22/20 2120 04/22/20 2354 04/23/20 0443 04/23/20 0738  BP: 139/78 135/82 (!) 153/87 139/79  Pulse: 71 77 73 68  Resp: 18 16 18 18   Temp: 97.9 F (36.6 C) 97.8 F (36.6 C) 97.8 F (36.6 C) 97.6 F (36.4 C)  TempSrc: Oral Oral Oral Oral  SpO2: 97% 96% 97% 98%  Weight:      Height:        General: Pt is alert, awake, not in acute distress Cardiovascular: RRR, S1/S2 +, no rubs, no gallops Respiratory: CTA bilaterally, no wheezing, no rhonchi Abdominal: Soft, NT, ND, bowel sounds + Extremities: no edema, no cyanosis Generalized weakness in all extremities but overall doing better  Discharge Instructions   Allergies as of 04/23/2020      Reactions   Hydrocodone Nausea Only   Noted after surgery, may be able to tolerate with food  Medication List    STOP taking these medications   clopidogrel 75 MG tablet Commonly known as: PLAVIX     TAKE these medications   acetaminophen 500 MG tablet Commonly known as: TYLENOL Take 500 mg by mouth every 6 (six) hours as needed for moderate pain.   atenolol 50 MG tablet Commonly known as: TENORMIN TAKE ONE TABLET TWICE DAILY   atorvastatin 80 MG tablet Commonly known as: LIPITOR TAKE ONE TABLET BY MOUTH EVERY DAY AT 6PM What changed:  See the new instructions.   Biotin 10000 MCG Tabs Take 5,000 mcg by mouth daily.   chlorpheniramine 4 MG tablet Commonly known as: CHLOR-TRIMETON Take 4 mg by mouth daily as needed for allergies.   cholecalciferol 1000 units tablet Commonly known as: VITAMIN D Take 1,000 Units by mouth daily.   hydrOXYzine 10 MG tablet Commonly known as: ATARAX/VISTARIL TAKE 1/2-1 TABLET BY MOUTH 3 TIMES DAILYAS NEEDED FOR ANXIETY (SEDATION CAUTION) What changed: See the new instructions.   ipratropium 0.06 % nasal spray Commonly known as: ATROVENT one spray per nostril twice daily (up to three times daily) to help with runny nose What changed:   how much to take  how to take this  when to take this  additional instructions   magnesium oxide 400 MG tablet Commonly known as: MAG-OX Take 500 mg by mouth every other day.   montelukast 10 MG tablet Commonly known as: SINGULAIR Take 1 tablet (10 mg total) by mouth at bedtime.   ticagrelor 90 MG Tabs tablet Commonly known as: BRILINTA Take 1 tablet (90 mg total) by mouth 2 (two) times daily.       Follow-up Information    [facility].   Specialty: Neurology Contact information: [address] [place] [phone]       Tonia Ghent, MD.   Specialty: [facility] Contact information: [place] [address] [place], [facility] Follow up.   Specialty: [place] Why: The home health agency will contact you for the first home visit Contact information: [address] [place] [address] [phone]              Allergies  Allergen Reactions  . Hydrocodone Nausea Only    Noted after surgery, may be able to tolerate with food    You were cared for by a hospitalist during your hospital stay. If you have any questions about your discharge medications or the care you received while you were  in the hospital after you are discharged, you can call the unit and asked to speak with the hospitalist on call if the hospitalist that took care of you is not available. Once you are discharged, your primary care physician will handle any further medical issues. Please note that no refills for any discharge medications will be authorized once you are discharged, as it is imperative that you return to your primary care physician (or establish a relationship with a primary care physician if you do not have one) for your aftercare needs so that they can reassess your need for medications and monitor your lab values.   Procedures/Studies: CT HEAD WO CONTRAST  Result Date: 04/18/2020 CLINICAL DATA:  Numbness. EXAM: CT HEAD WITHOUT CONTRAST TECHNIQUE: Contiguous axial images were obtained from the base of the skull through the vertex without intravenous contrast. COMPARISON:  February 16, 2020. FINDINGS: Brain: Mild chronic  ischemic white matter disease is noted. 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 No mass effect or midline shift is noted. Ventricular size is within normal limits. There is no evidence of mass lesion, hemorrhage or acute infarction. Vascular: No hyperdense vessel or unexpected calcification. Skull: Normal. Negative for fracture or focal lesion. Sinuses/Orbits: No acute finding. Other: None. IMPRESSION: Mild chronic ischemic white matter disease. 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 No acute intracranial abnormality seen. Electronically Signed   By: Marijo Conception M.D.   On: 04/18/2020 13:45   MR ANGIO HEAD WO CONTRAST  Result Date: 04/18/2020 CLINICAL DATA:  Neuro deficit, acute stroke suspected. EXAM: MRA HEAD WITHOUT CONTRAST TECHNIQUE: Angiographic images of the Circle of Willis were obtained using MRA technique without intravenous contrast. COMPARISON:  MRA or of the brain February 16, 2020. FINDINGS: The bilateral vertebral arteries and the basilar  artery have normal flow related enhancement. Luminal irregularities are noted along the posterior cerebral arteries with moderate stenosis at the P2P and P3 segments on the left. Luminal irregularities along the bilateral carotid siphons with moderate stenosis at the paraclinoid segment. Attenuation of the flow related enhancement at the left A2-A3 junction consistent with severe stenosis. The bilateral MCA and and right ACA vascular tree have normal flow related enhancement. The degree of stenosis of the left ACA has progressed from prior MRA. IMPRESSION: 1. 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 2. Moderate stenosis at the P2P and P3 segments on the left, and moderate stenosis at the paraclinoid segment of the bilateral ICA. Electronically Signed   By: Pedro Earls M.D.   On: 04/18/2020 19:19   MR BRAIN WO CONTRAST  Result Date: 04/18/2020 CLINICAL DATA:  Numbness of right side EXAM: MRI HEAD WITHOUT CONTRAST TECHNIQUE: Multiplanar, multiecho pulse sequences of the brain and surrounding structures were obtained without intravenous contrast. COMPARISON:  02/16/2020 FINDINGS: Brain: There is patchy reduced diffusion involving the left superior frontal gyrus, cingulate gyrus, and body of the corpus callosum. There is also some involvement of the parasagittal left parietal lobe. Left parietal focus susceptibility is again identified and probably reflects chronic microhemorrhage. Patchy and confluent areas of T2 hyperintensity in the supratentorial and pontine white matter are nonspecific but probably reflects stable moderate to advanced chronic microvascular ischemic changes. There is a chronic small vessel infarct of the right corona radiata. There is no intracranial mass or mass effect. Ventricles are stable in size. No hydrocephalus or extra-axial collection. Vascular: Major vessel flow voids at the skull base are preserved. Skull and upper cervical spine: Normal  marrow signal is preserved. Sinuses/Orbits: Trace mucosal thick.  Bilateral lens replacement. Other: Sella is unremarkable.  Mastoid air cells are clear. IMPRESSION: Acute left ACA territory infarcts.  No hemorrhage or mass effect. Stable chronic findings detailed above including moderate to advanced chronic microvascular ischemic changes. Electronically Signed   By: Macy Mis M.D.   On: 04/18/2020 15:41   VAS US CAROTID  Result Date: 04/20/2020 Carotid Arterial Duplex Study Indications:       Left ACA territory strokes. Risk Factors:      Hypertension, hyperlipidemia. Comparison Study:  11/26/2012- carotid artery duplex Performing Technologist: Maudry Mayhew MHA, RDMS, RVT, RDCS  Examination Guidelines: A complete evaluation includes B-mode imaging, spectral Doppler, color Doppler, and power Doppler as needed of all accessible portions of each vessel. Bilateral testing is considered an integral part of a complete examination. Limited examinations for reoccurring indications may be performed as  noted.  Right Carotid Findings: +----------+--------+--------+--------+------------------+--------+           PSV cm/sEDV cm/sStenosisPlaque DescriptionComments +----------+--------+--------+--------+------------------+--------+ CCA Prox  85      15                                         +----------+--------+--------+--------+------------------+--------+ CCA Distal59      19                                         +----------+--------+--------+--------+------------------+--------+ ICA Prox  54      17                                         +----------+--------+--------+--------+------------------+--------+ ICA Distal66      13                                         +----------+--------+--------+--------+------------------+--------+ ECA       80      15                                         +----------+--------+--------+--------+------------------+--------+  +----------+--------+-------+----------------+-------------------+           PSV cm/sEDV cmsDescribe        Arm Pressure (mmHG) +----------+--------+-------+----------------+-------------------+ [id number]             Multiphasic, WNL                    +----------+--------+-------+----------------+-------------------+ +---------+--------+--+--------+--+---------+ VertebralPSV cm/s42EDV cm/s14Antegrade +---------+--------+--+--------+--+---------+  Left Carotid Findings: +----------+--------+--------+--------+-----------------------+--------+           PSV cm/sEDV cm/sStenosisPlaque Description     Comments +----------+--------+--------+--------+-----------------------+--------+ CCA Prox  70      14                                              +----------+--------+--------+--------+-----------------------+--------+ CCA Distal62      17              smooth and heterogenous         +----------+--------+--------+--------+-----------------------+--------+ ICA Prox  63      18              smooth and heterogenous         +----------+--------+--------+--------+-----------------------+--------+ ICA Distal67      23                                              +----------+--------+--------+--------+-----------------------+--------+ ECA       86      13                                              +----------+--------+--------+--------+-----------------------+--------+ +----------+--------+--------+----------------+-------------------+  PSV cm/sEDV cm/sDescribe        Arm Pressure (mmHG) +----------+--------+--------+----------------+-------------------+ Subclavian101             Multiphasic, WNL                    +----------+--------+--------+----------------+-------------------+ +---------+--------+--+--------+--+---------+ VertebralPSV cm/s51EDV cm/s13Antegrade +---------+--------+--+--------+--+---------+   Summary: Right Carotid:  Velocities in the right ICA are consistent with a 1-39% stenosis. Left Carotid: Velocities in the left ICA are consistent with a 1-39% stenosis. Vertebrals:  Bilateral vertebral arteries demonstrate antegrade flow. Subclavians: Normal flow hemodynamics were seen in bilateral subclavian              arteries. *See table(s) above for measurements and observations.     Preliminary    Home sleep test  Result Date: 04/02/2020 Star Age, MD     04/05/2020  7:24 AM  [facility] HOME SLEEP TEST (Watch PAT) STUDY DATE: 04/03/20 DOB: 01/24/1941 MRN: [id number] ORDERING CLINICIAN: Star Age, MD, PhD  REFERRING CLINICIAN: Dr. Dimple Nanas, NP CLINICAL INFORMATION/HISTORY: 79 year old woman with a history of stroke, irritable bowel syndrome, hypertension, hyperlipidemia, reflux disease, depression, arthritis, and obesity, who was previously diagnosed with obstructive sleep apnea and placed on PAP therapy.  She has an older autoPAP machine and is compliant with treatment.  She presents for reevaluation.  She is eligible for new equipment. Epworth sleepiness score: 9/24. BMI: 33.3 kg/m FINDINGS: Total Record Time (hours, min): 7 H 33 min Total Sleep Time (hours, min):  6 H 44 min Percent REM (%):    26.71 % Calculated pAHI (per hour):  50.6       REM pAHI: 37.9    NREM pAHI: 55.2  Oxygen Saturation (%) Mean: 92  Minimum oxygen saturation (%): 86                 O2 Saturation Range (%): 97-86  O2Saturation (minutes) <=88%: 1.8 Pulse Mean (bpm):    61  Pulse Range (51-91) IMPRESSION: OSA (obstructive sleep apnea) RECOMMENDATION: This home sleep test demonstrates severe obstructive sleep apnea -by a number of events - with a total AHI of 50.6/hour and O2 nadir of 86%. Ongoing treatment with positive airway pressure is recommended. The patient will be advised to proceed with AutoPAP therapy with new equipment. A full night titration study may be considered to optimize treatment settings, if needed  down the road. Please note that untreated obstructive sleep apnea may carry additional perioperative morbidity. Patients with significant obstructive sleep apnea should receive perioperative PAP therapy and the surgeons and particularly the anesthesiologist should be informed of the diagnosis and the severity of the sleep disordered breathing. The patient should be cautioned not to drive, work at heights, or operate dangerous or heavy equipment when tired or sleepy. Review and reiteration of good sleep hygiene measures should be pursued with any patient. Other causes of the patient's symptoms, including circadian rhythm disturbances, an underlying mood disorder, medication effect and/or an underlying medical problem cannot be ruled out based on this test. Clinical correlation is recommended. The patient and her referring provider will be notified of the test results. The patient will be seen in follow up in sleep clinic at [facility]. I certify that I have reviewed the raw data recording prior to the issuance of this report in accordance with the standards of the American Academy of Sleep Medicine (AASM). INTERPRETING PHYSICIAN: Star Age, MD, PhD Board Certified in Neurology and Sleep Medicine [facility] [address], [place],  [place] [address] [phone]     The results of significant diagnostics from this hospitalization (including imaging, microbiology, ancillary and laboratory) are listed below for reference.     Microbiology: Recent Results (from the past 240 hour(s))  Urine culture     Status: None   Collection Time: 04/18/20 11:42 AM   Specimen: Urine, Clean Catch  Result Value Ref Range Status   Specimen Description URINE, CLEAN CATCH  Final   Special Requests NONE  Final   Culture   Final    NO GROWTH Performed at [facility], [address] [place], [place] [address]    Report Status 04/19/2020 FINAL  Final  Resp Panel by RT-PCR (Flu A&B, Covid)  Nasopharyngeal Swab     Status: None   Collection Time: 04/18/20  5:09 PM   Specimen: Nasopharyngeal Swab; Nasopharyngeal(NP) swabs in vial transport medium  Result Value Ref Range Status   SARS Coronavirus 2 by RT PCR NEGATIVE NEGATIVE Final    Comment: (NOTE) SARS-CoV-2 target nucleic acids are NOT DETECTED.  The SARS-CoV-2 RNA is generally detectable in upper respiratory specimens during the acute phase of infection. The lowest concentration of SARS-CoV-2 viral copies this assay can detect is 138 copies/mL. A negative result does not preclude SARS-Cov-2 infection and should not be used as the sole basis for treatment or other patient management decisions. A negative result may occur with  improper specimen collection/handling, submission of specimen other than nasopharyngeal swab, presence of viral mutation(s) within the areas targeted by this assay, and inadequate number of viral copies(<138 copies/mL). A negative result must be combined with clinical observations, patient history, and epidemiological information. The expected result is Negative.  Fact Sheet for Patients:  EntrepreneurPulse.com.au  Fact Sheet for Healthcare Providers:  IncredibleEmployment.be  This test is no t yet approved or cleared by the [place] FDA and  has been authorized for detection and/or diagnosis of SARS-CoV-2 by FDA under an Emergency Use Authorization (EUA). This EUA will remain  in effect (meaning this test can be used) for the duration of the COVID-19 declaration under Section 564(b)(1) of the Act, 21 U.S.C.section 360bbb-3(b)(1), unless the authorization is terminated  or revoked sooner.       Influenza A by PCR NEGATIVE NEGATIVE Final   Influenza B by PCR NEGATIVE NEGATIVE Final    Comment: (NOTE) The Xpert Xpress SARS-CoV-2/FLU/RSV plus assay is intended as an aid in the diagnosis of influenza from Nasopharyngeal swab specimens and should not be  used as a sole basis for treatment. Nasal washings and aspirates are unacceptable for Xpert Xpress SARS-CoV-2/FLU/RSV testing.  Fact Sheet for Patients: EntrepreneurPulse.com.au  Fact Sheet for Healthcare Providers: IncredibleEmployment.be  This test is not yet approved or cleared by the [place] FDA and has been authorized for detection and/or diagnosis of SARS-CoV-2 by FDA under an Emergency Use Authorization (EUA). This EUA will remain in effect (meaning this test can be used) for the duration of the COVID-19 declaration under Section 564(b)(1) of the Act, 21 U.S.C. section 360bbb-3(b)(1), unless the authorization is terminated or revoked.  Performed at [facility], [place] [address] [place], [place] [address]      Labs: BNP (last 3 results) No results for input(s): BNP in the last 8760 hours. Basic Metabolic Panel: Recent Labs  Lab 04/18/20 0943 04/18/20 0943 04/18/20 0955 04/20/20 0407 04/21/20 0255 04/22/20 0315 04/23/20 0504  NA 140   < > 141 141 141 140 140  K 3.9   < >  4.1 3.6 3.9 3.7 3.9  CL 105   < > 104 108 111 108 108  CO2 24  --   --  21* 22 22 21*  GLUCOSE 114*   < > 108* 110* 112* 111* 108*  BUN 10   < > 11 9 5* 9 13  CREATININE 0.98   < > 0.80 0.82 0.74 0.82 0.85  CALCIUM 10.2  --   --  9.6 9.6 9.5 9.7  MG  --   --   --  2.0 1.9 1.9 2.0   < > = values in this interval not displayed.   Liver Function Tests: Recent Labs  Lab 04/18/20 0943  AST 30  ALT 21  ALKPHOS 104  BILITOT 1.2  PROT 6.7  ALBUMIN 4.3   Recent Labs  Lab 04/18/20 1124  LIPASE 39   No results for input(s): AMMONIA in the last 168 hours. CBC: Recent Labs  Lab 04/18/20 0943 04/18/20 0943 04/18/20 0955 04/20/20 0407 04/21/20 0255 04/22/20 0315 04/23/20 0504  WBC 7.3  --   --  5.9 6.3 6.1 6.8  NEUTROABS 5.7  --   --   --   --   --   --   HGB 15.9*   < > 16.7* 13.5 13.9 14.1 14.3  HCT 48.7*   < > 49.0* 41.3 41.9 42.7  43.3  MCV 95.1  --   --  93.2 92.1 91.8 92.7  PLT 293  --   --  201 228 225 233   < > = values in this interval not displayed.   Cardiac Enzymes: No results for input(s): CKTOTAL, CKMB, CKMBINDEX, TROPONINI in the last 168 hours. BNP: Invalid input(s): POCBNP CBG: Recent Labs  Lab 04/18/20 0937  GLUCAP 113*   D-Dimer No results for input(s): DDIMER in the last 72 hours. Hgb A1c No results for input(s): HGBA1C in the last 72 hours. Lipid Profile No results for input(s): CHOL, HDL, LDLCALC, TRIG, CHOLHDL, LDLDIRECT in the last 72 hours. Thyroid function studies No results for input(s): TSH, T4TOTAL, T3FREE, THYROIDAB in the last 72 hours.  Invalid input(s): FREET3 Anemia work up No results for input(s): VITAMINB12, FOLATE, FERRITIN, TIBC, IRON, RETICCTPCT in the last 72 hours. Urinalysis    Component Value Date/Time   COLORURINE YELLOW 04/18/2020 1142   APPEARANCEUR CLEAR 04/18/2020 1142   APPEARANCEUR Clear 11/26/2012 1630   LABSPEC 1.010 04/18/2020 1142   LABSPEC 1.009 11/26/2012 1630   PHURINE 5.0 04/18/2020 1142   GLUCOSEU NEGATIVE 04/18/2020 1142   GLUCOSEU Negative 11/26/2012 1630   HGBUR SMALL (A) 04/18/2020 1142   BILIRUBINUR NEGATIVE 04/18/2020 1142   BILIRUBINUR Neg 03/09/2017 1601   BILIRUBINUR Negative 11/26/2012 1630   KETONESUR 5 (A) 04/18/2020 1142   PROTEINUR NEGATIVE 04/18/2020 1142   UROBILINOGEN 0.2 03/09/2017 1601   NITRITE NEGATIVE 04/18/2020 1142   LEUKOCYTESUR NEGATIVE 04/18/2020 1142   LEUKOCYTESUR Negative 11/26/2012 1630   Sepsis Labs Invalid input(s): PROCALCITONIN,  WBC,  LACTICIDVEN Microbiology Recent Results (from the past 240 hour(s))  Urine culture     Status: None   Collection Time: 04/18/20 11:42 AM   Specimen: Urine, Clean Catch  Result Value Ref Range Status   Specimen Description URINE, CLEAN CATCH  Final   Special Requests NONE  Final   Culture   Final    NO GROWTH Performed at [facility], [place] [address]  [place], [place] [address]    Report Status 04/19/2020 FINAL  Final  Resp Panel  by RT-PCR (Flu A&B, Covid) Nasopharyngeal Swab     Status: None   Collection Time: 04/18/20  5:09 PM   Specimen: Nasopharyngeal Swab; Nasopharyngeal(NP) swabs in vial transport medium  Result Value Ref Range Status   SARS Coronavirus 2 by RT PCR NEGATIVE NEGATIVE Final    Comment: (NOTE) SARS-CoV-2 target nucleic acids are NOT DETECTED.  The SARS-CoV-2 RNA is generally detectable in upper respiratory specimens during the acute phase of infection. The lowest concentration of SARS-CoV-2 viral copies this assay can detect is 138 copies/mL. A negative result does not preclude SARS-Cov-2 infection and should not be used as the sole basis for treatment or other patient management decisions. A negative result may occur with  improper specimen collection/handling, submission of specimen other than nasopharyngeal swab, presence of viral mutation(s) within the areas targeted by this assay, and inadequate number of viral copies(<138 copies/mL). A negative result must be combined with clinical observations, patient history, and epidemiological information. The expected result is Negative.  Fact Sheet for Patients:  Entrepreneur[employer]ulse.[employer].au  Fact Sheet for Healthcare Providers:  IncredibleEmployment.be  This test is no t yet approved or cleared by the [place] FDA and  has been authorized for detection and/or diagnosis of SARS-CoV-2 by FDA under an Emergency Use Authorization (EUA). This EUA will remain  in effect (meaning this test can be used) for the duration of the COVID-19 declaration under Section 564(b)(1) of the Act, 21 U.S.C.section 360bbb-3(b)(1), unless the authorization is terminated  or revoked sooner.       Influenza A by PCR NEGATIVE NEGATIVE Final   Influenza B by PCR NEGATIVE NEGATIVE Final    Comment: (NOTE) The Xpert Xpress SARS-CoV-2/FLU/RSV plus assay is  intended as an aid in the diagnosis of influenza from Nasopharyngeal swab specimens and should not be used as a sole basis for treatment. Nasal washings and aspirates are unacceptable for Xpert Xpress SARS-CoV-2/FLU/RSV testing.  Fact Sheet for Patients: EntrepreneurPulse.com.au  Fact Sheet for Healthcare Providers: IncredibleEmployment.be  This test is not yet approved or cleared by the [place] FDA and has been authorized for detection and/or diagnosis of SARS-CoV-2 by FDA under an Emergency Use Authorization (EUA). This EUA will remain in effect (meaning this test can be used) for the duration of the COVID-19 declaration under Section 564(b)(1) of the Act, 21 U.S.C. section 360bbb-3(b)(1), unless the authorization is terminated or revoked.  Performed at [facility], [place] [address] [place], [place] [address]      Time coordinating discharge:  I have spent 35 minutes face to face with the patient and on the ward discussing the patients care, assessment, plan and disposition with other care givers. >50% of the time was devoted counseling the patient about the risks and benefits of treatment/Discharge disposition and coordinating care.   SIGNED:   Damita Lack, MD  [facility] 04/23/2020, 10:42 AM   If 7PM-7AM, please contact night-coverage

## 2020-04-23 NOTE — Plan of Care (Signed)
  Problem: Coping: Goal: Will identify appropriate support needs Outcome: Progressing   Problem: Coping: Goal: Will verbalize positive feelings about self Outcome: Progressing   Problem: Education: Goal: Knowledge of patient specific risk factors addressed and post discharge goals established will improve Outcome: Progressing   Problem: Education: Goal: Knowledge of secondary prevention will improve Outcome: Progressing   Problem: Education: Goal: Knowledge of disease or condition will improve Outcome: Progressing

## 2020-04-23 NOTE — Progress Notes (Signed)
Physical Therapy Treatment Patient Details Name: [name] [name] MRN: [id number] DOB: 17-May-1941 Today's Date: 04/23/2020    History of Present Illness Pt is a 79 year old female who presented with intermittent R arm and leg numbness/tingling and weakness that have lasted 2-3 days. Her daughter also reported the pt has been "out of it" and not herself the past few days. 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 She was not administered tPA. NIHSS = 0. 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 PMH: CVAs, sleep apnea, pneumonia, obesity, IBS, HTN, depression, and arthritis.    PT Comments    Patient progressing well towards PT goals. Reports she feels stronger today. Tolerated ADL session at the sink with supervision for safety, favoring LLE for support. Performed 5xSTS in 14.47 seconds (MDC is 3.6-4.2 seconds); prior time was 19.4 seconds indicating significant improvement/change based on [place]. However, continues to demonstrate decreased functional strength, fall risk and impaired balance based on her score. Improved in her TUG score as well from 30.99 seconds to 22.99 seconds. Pt continues to have decreased awareness of deficits which is improving, fatigue and BLE weakness, RLE>LLE. If pt does not get approved for CIR, recommend neuro OPPT to address higher level tasks/balance. Will follow.   Follow Up Recommendations  CIR;Supervision for mobility/OOB     Equipment Recommendations  None recommended by PT    Recommendations for Other Services       Precautions / Restrictions Precautions Precautions: Fall Restrictions Weight Bearing Restrictions: No    Mobility  Bed Mobility Overal bed mobility: Needs Assistance Bed Mobility: Supine to Sit     Supine to sit: Supervision;HOB elevated     General bed  mobility comments: No assist needed.  Transfers Overall transfer level: Needs assistance Equipment used: Rolling walker (2 wheeled) Transfers: Sit to/from Stand Sit to Stand: Supervision         General transfer comment: Supervision for safety. Stood from EOB x6, transferred to chair post ambulation.  Ambulation/Gait Ambulation/Gait assistance: Supervision Gait Distance (Feet): 100 Feet Assistive device: Rolling walker (2 wheeled) Gait Pattern/deviations: Step-through pattern;Decreased stride length;Narrow base of support;Trunk flexed Gait velocity: decreased   General Gait Details: Slow, mostly steady gait with use of RW. No knee buckling. Fatigues. Reports feeling better   Stairs             Wheelchair Mobility    Modified Rankin (Stroke Patients Only) Modified Rankin (Stroke Patients Only) Pre-Morbid Rankin Score: No significant disability Modified Rankin: Moderately severe disability     Balance Overall balance assessment: Needs assistance Sitting-balance support: Feet supported;No upper extremity supported Sitting balance-Leahy Scale: Good     Standing balance support: During functional activity Standing balance-Leahy Scale: Fair Standing balance comment: Able to stand at sink and perform ADLs- washing face, armpits, brushing teeth without LOB.                       Timed Up and Go Test TUG: Normal TUG Normal TUG (seconds): 22.99    Cognition Arousal/Alertness: Awake/alert   Overall Cognitive Status: Within Functional Limits for tasks assessed  General Comments: Better awareness of deficits today; cognition seems to be improving, asking appropriate questions.      Exercises      General Comments General comments (skin integrity, edema, etc.): Performed 5xSTS in 14.47 seconds (MDC is 3.6-4.2 seconds), prior time was 19.4 seconds indicating improved time with significant change based on [place].  Continues to demonstrate decreased functional strength, fall risk and impaired balance based on her score.      Pertinent Vitals/Pain Pain Assessment: No/denies pain    Home Living                      Prior Function            PT Goals (current goals can now be found in the care plan section) Progress towards PT goals: Progressing toward goals    Frequency    Min 4X/week      PT Plan Current plan remains appropriate    Co-evaluation              AM-PAC PT "6 Clicks" Mobility   Outcome Measure  Help needed turning from your back to your side while in a flat bed without using bedrails?: None Help needed moving from lying on your back to sitting on the side of a flat bed without using bedrails?: None Help needed moving to and from a bed to a chair (including a wheelchair)?: None Help needed standing up from a chair using your arms (e.g., wheelchair or bedside chair)?: None Help needed to walk in hospital room?: A Little Help needed climbing 3-5 steps with a railing? : A Little 6 Click Score: 22    End of Session Equipment Utilized During Treatment: Gait belt Activity Tolerance: Patient tolerated treatment well Patient left: in chair;with call bell/phone within reach;Other (comment) (MD in room) Nurse Communication: Mobility status PT Visit Diagnosis: Unsteadiness on feet (R26.81);Other abnormalities of gait and mobility (R26.89);Muscle weakness (generalized) (M62.81);History of falling (Z91.81);Difficulty in walking, not elsewhere classified (R26.2);Other symptoms and signs involving the nervous system (R29.898);Hemiplegia and hemiparesis Hemiplegia - caused by: Cerebral infarction     Time: 7915-0569 PT Time Calculation (min) (ACUTE ONLY): 27 min  Charges:  $Gait Training: 8-22 mins $Therapeutic Activity: 8-22 mins                     Marisa Severin, PT, DPT Acute Rehabilitation Services Pager [phone] Office [place] 04/23/2020, 9:43 AM

## 2020-04-24 ENCOUNTER — Telehealth: Payer: Self-pay | Admitting: Neurology

## 2020-04-24 ENCOUNTER — Telehealth: Payer: Self-pay

## 2020-04-24 NOTE — Telephone Encounter (Signed)
Thanks for calling to get clarification.  Aspirin dose should be 81 mg daily along with Brilinta 90 mg twice daily for 3 months

## 2020-04-24 NOTE — Telephone Encounter (Signed)
I called pt. I discussed this with her. She will take a baby ASA daily. Pt will follow up as scheduled.

## 2020-04-24 NOTE — Telephone Encounter (Signed)
I called pt. She reports that Dr. Leonie Man recommended brilinta for her during her recent hospitalization. Per Dr. Clydene Fake note: "Recommend aspirin and Brilinta for 3 months followed by Brilinta alone if affordable."  Pt reports that she was not taking aspirin with the brilinta in the hospital. She wants to know which dosage of aspirin she should take and if she should be taking the aspirin BID with the brilinta or just QD.

## 2020-04-24 NOTE — Telephone Encounter (Signed)
Transition Care Management Follow-up Telephone Call  Date of discharge and from where: [facility]/23/2020[facility]   How have you been since you were released from the hospital? Patient states that she is doing fine. No complaints at this time.  Any questions or concerns? No  Items Reviewed:  Did the pt receive and understand the discharge instructions provided? Yes   Medications obtained and verified? Yes   Other? No   Any new allergies since your discharge? No   Dietary orders reviewed? Yes  Do you have support at home? Yes   Home Care and Equipment/Supplies: Were home health services ordered? yes If so, what is the name of the agency? [facility]  Has the agency set up a time to come to the patient's home? no Were any new equipment or medical supplies ordered?  No What is the name of the medical supply agency? N/A Were you able to get the supplies/equipment? not applicable Do you have any questions related to the use of the equipment or supplies? No  Functional Questionnaire: (I = Independent and D = Dependent) ADLs: I  Bathing/Dressing- I  Meal Prep- I  Eating- I  Maintaining continence- I  Transferring/Ambulation- I  Managing Meds- I  Follow up appointments reviewed:   PCP Hospital f/u appt confirmed? No  Patient wants to follow up with her neurologist only right now. States she will call back at a later date and schedule an appointment with Dr. Damita Dunnings.   [facility] f/u appt confirmed? Yes  Scheduled to see neurology  Are transportation arrangements needed? No   If their condition worsens, is the pt aware to call PCP or go to the Emergency Dept.? Yes  Was the patient provided with contact information for the PCP's office or ED? Yes  Was to pt encouraged to call back with questions or concerns? Yes

## 2020-04-24 NOTE — Telephone Encounter (Signed)
Pt. called, wanting to speak with Dr. about  ticagrelor (BRILINTA) 90 MG TABS tablet. Please advise.

## 2020-04-25 NOTE — Telephone Encounter (Signed)
Noted. Thanks.

## 2020-04-26 DIAGNOSIS — M25571 Pain in right ankle and joints of right foot: Secondary | ICD-10-CM | POA: Diagnosis not present

## 2020-04-26 DIAGNOSIS — G4733 Obstructive sleep apnea (adult) (pediatric): Secondary | ICD-10-CM | POA: Diagnosis not present

## 2020-04-26 DIAGNOSIS — I4891 Unspecified atrial fibrillation: Secondary | ICD-10-CM | POA: Diagnosis not present

## 2020-04-26 DIAGNOSIS — G8911 Acute pain due to trauma: Secondary | ICD-10-CM | POA: Diagnosis not present

## 2020-04-26 DIAGNOSIS — M1711 Unilateral primary osteoarthritis, right knee: Secondary | ICD-10-CM | POA: Diagnosis not present

## 2020-04-26 DIAGNOSIS — F329 Major depressive disorder, single episode, unspecified: Secondary | ICD-10-CM | POA: Diagnosis not present

## 2020-04-26 DIAGNOSIS — I69354 Hemiplegia and hemiparesis following cerebral infarction affecting left non-dominant side: Secondary | ICD-10-CM | POA: Diagnosis not present

## 2020-04-26 DIAGNOSIS — I10 Essential (primary) hypertension: Secondary | ICD-10-CM | POA: Diagnosis not present

## 2020-04-26 DIAGNOSIS — I69351 Hemiplegia and hemiparesis following cerebral infarction affecting right dominant side: Secondary | ICD-10-CM | POA: Diagnosis not present

## 2020-05-01 DIAGNOSIS — G4733 Obstructive sleep apnea (adult) (pediatric): Secondary | ICD-10-CM | POA: Diagnosis not present

## 2020-05-01 DIAGNOSIS — M1711 Unilateral primary osteoarthritis, right knee: Secondary | ICD-10-CM | POA: Diagnosis not present

## 2020-05-01 DIAGNOSIS — F329 Major depressive disorder, single episode, unspecified: Secondary | ICD-10-CM | POA: Diagnosis not present

## 2020-05-01 DIAGNOSIS — G8911 Acute pain due to trauma: Secondary | ICD-10-CM | POA: Diagnosis not present

## 2020-05-01 DIAGNOSIS — I69354 Hemiplegia and hemiparesis following cerebral infarction affecting left non-dominant side: Secondary | ICD-10-CM | POA: Diagnosis not present

## 2020-05-01 DIAGNOSIS — I10 Essential (primary) hypertension: Secondary | ICD-10-CM | POA: Diagnosis not present

## 2020-05-01 DIAGNOSIS — I69351 Hemiplegia and hemiparesis following cerebral infarction affecting right dominant side: Secondary | ICD-10-CM | POA: Diagnosis not present

## 2020-05-01 DIAGNOSIS — M25571 Pain in right ankle and joints of right foot: Secondary | ICD-10-CM | POA: Diagnosis not present

## 2020-05-01 DIAGNOSIS — I4891 Unspecified atrial fibrillation: Secondary | ICD-10-CM | POA: Diagnosis not present

## 2020-05-02 DIAGNOSIS — I69351 Hemiplegia and hemiparesis following cerebral infarction affecting right dominant side: Secondary | ICD-10-CM | POA: Diagnosis not present

## 2020-05-02 DIAGNOSIS — M1711 Unilateral primary osteoarthritis, right knee: Secondary | ICD-10-CM | POA: Diagnosis not present

## 2020-05-02 DIAGNOSIS — G8911 Acute pain due to trauma: Secondary | ICD-10-CM | POA: Diagnosis not present

## 2020-05-02 DIAGNOSIS — I4891 Unspecified atrial fibrillation: Secondary | ICD-10-CM | POA: Diagnosis not present

## 2020-05-02 DIAGNOSIS — F329 Major depressive disorder, single episode, unspecified: Secondary | ICD-10-CM | POA: Diagnosis not present

## 2020-05-02 DIAGNOSIS — M25571 Pain in right ankle and joints of right foot: Secondary | ICD-10-CM | POA: Diagnosis not present

## 2020-05-02 DIAGNOSIS — I10 Essential (primary) hypertension: Secondary | ICD-10-CM | POA: Diagnosis not present

## 2020-05-02 DIAGNOSIS — I69354 Hemiplegia and hemiparesis following cerebral infarction affecting left non-dominant side: Secondary | ICD-10-CM | POA: Diagnosis not present

## 2020-05-02 DIAGNOSIS — G4733 Obstructive sleep apnea (adult) (pediatric): Secondary | ICD-10-CM | POA: Diagnosis not present

## 2020-05-03 DIAGNOSIS — G8911 Acute pain due to trauma: Secondary | ICD-10-CM | POA: Diagnosis not present

## 2020-05-03 DIAGNOSIS — I69351 Hemiplegia and hemiparesis following cerebral infarction affecting right dominant side: Secondary | ICD-10-CM | POA: Diagnosis not present

## 2020-05-03 DIAGNOSIS — G4733 Obstructive sleep apnea (adult) (pediatric): Secondary | ICD-10-CM | POA: Diagnosis not present

## 2020-05-03 DIAGNOSIS — M1711 Unilateral primary osteoarthritis, right knee: Secondary | ICD-10-CM | POA: Diagnosis not present

## 2020-05-03 DIAGNOSIS — F329 Major depressive disorder, single episode, unspecified: Secondary | ICD-10-CM | POA: Diagnosis not present

## 2020-05-03 DIAGNOSIS — I10 Essential (primary) hypertension: Secondary | ICD-10-CM | POA: Diagnosis not present

## 2020-05-03 DIAGNOSIS — I69354 Hemiplegia and hemiparesis following cerebral infarction affecting left non-dominant side: Secondary | ICD-10-CM | POA: Diagnosis not present

## 2020-05-03 DIAGNOSIS — I4891 Unspecified atrial fibrillation: Secondary | ICD-10-CM | POA: Diagnosis not present

## 2020-05-03 DIAGNOSIS — M25571 Pain in right ankle and joints of right foot: Secondary | ICD-10-CM | POA: Diagnosis not present

## 2020-05-04 ENCOUNTER — Telehealth: Payer: Self-pay | Admitting: Family Medicine

## 2020-05-04 DIAGNOSIS — I10 Essential (primary) hypertension: Secondary | ICD-10-CM | POA: Diagnosis not present

## 2020-05-04 DIAGNOSIS — I4891 Unspecified atrial fibrillation: Secondary | ICD-10-CM | POA: Diagnosis not present

## 2020-05-04 DIAGNOSIS — G4733 Obstructive sleep apnea (adult) (pediatric): Secondary | ICD-10-CM | POA: Diagnosis not present

## 2020-05-04 DIAGNOSIS — M1711 Unilateral primary osteoarthritis, right knee: Secondary | ICD-10-CM | POA: Diagnosis not present

## 2020-05-04 DIAGNOSIS — M25571 Pain in right ankle and joints of right foot: Secondary | ICD-10-CM | POA: Diagnosis not present

## 2020-05-04 DIAGNOSIS — F329 Major depressive disorder, single episode, unspecified: Secondary | ICD-10-CM | POA: Diagnosis not present

## 2020-05-04 DIAGNOSIS — G8911 Acute pain due to trauma: Secondary | ICD-10-CM | POA: Diagnosis not present

## 2020-05-04 DIAGNOSIS — I69354 Hemiplegia and hemiparesis following cerebral infarction affecting left non-dominant side: Secondary | ICD-10-CM | POA: Diagnosis not present

## 2020-05-04 DIAGNOSIS — I69351 Hemiplegia and hemiparesis following cerebral infarction affecting right dominant side: Secondary | ICD-10-CM | POA: Diagnosis not present

## 2020-05-04 MED ORDER — POLYETHYLENE GLYCOL 3350 17 GM/SCOOP PO POWD: 17.0000 g | Freq: Two times a day (BID) | ORAL | Status: DC | PRN

## 2020-05-04 NOTE — Telephone Encounter (Signed)
Pt called for mrs Belleau due to she called to cancel PT due to onset lower exermity weakness and not feeling right did decline being seen , and wanted to know about getting verbal orders for a nurse to see her and to get UA and labs done

## 2020-05-04 NOTE — Telephone Encounter (Signed)
Tillie Rung PT notified as instructed and voiced understanding and Tillie Rung will let pt know.

## 2020-05-04 NOTE — Telephone Encounter (Signed)
Noted  

## 2020-05-04 NOTE — Telephone Encounter (Signed)
Tillie Rung PT with [facility] called to report she went out to evaluate pt, vitals were normal but pt seems sick and lethargic  Also pt has been home from the hospital for 2 weeks and has had only 1 BM... pt is currently taking Magnesium and a stool softener sh is currently taking... Wants to make sure that you would not recommend anything different for pt  Please advise

## 2020-05-04 NOTE — Telephone Encounter (Signed)
If she is lethargic, then she needs eval.   Okay to give order for U/a at home and she can add on miralax 17g BID prn for constipation, but I would not delay the eval in the meantime- either here or UC.

## 2020-05-07 ENCOUNTER — Other Ambulatory Visit: Payer: Self-pay | Admitting: Internal Medicine

## 2020-05-07 DIAGNOSIS — I10 Essential (primary) hypertension: Secondary | ICD-10-CM | POA: Diagnosis not present

## 2020-05-07 DIAGNOSIS — I4891 Unspecified atrial fibrillation: Secondary | ICD-10-CM | POA: Diagnosis not present

## 2020-05-07 DIAGNOSIS — M25571 Pain in right ankle and joints of right foot: Secondary | ICD-10-CM | POA: Diagnosis not present

## 2020-05-07 DIAGNOSIS — F329 Major depressive disorder, single episode, unspecified: Secondary | ICD-10-CM | POA: Diagnosis not present

## 2020-05-07 DIAGNOSIS — G8911 Acute pain due to trauma: Secondary | ICD-10-CM | POA: Diagnosis not present

## 2020-05-07 DIAGNOSIS — I639 Cerebral infarction, unspecified: Secondary | ICD-10-CM

## 2020-05-07 DIAGNOSIS — G4733 Obstructive sleep apnea (adult) (pediatric): Secondary | ICD-10-CM

## 2020-05-07 DIAGNOSIS — I69354 Hemiplegia and hemiparesis following cerebral infarction affecting left non-dominant side: Secondary | ICD-10-CM | POA: Diagnosis not present

## 2020-05-07 DIAGNOSIS — M1711 Unilateral primary osteoarthritis, right knee: Secondary | ICD-10-CM | POA: Diagnosis not present

## 2020-05-07 DIAGNOSIS — I69351 Hemiplegia and hemiparesis following cerebral infarction affecting right dominant side: Secondary | ICD-10-CM | POA: Diagnosis not present

## 2020-05-08 ENCOUNTER — Telehealth: Payer: Self-pay | Admitting: Neurology

## 2020-05-08 ENCOUNTER — Emergency Department (HOSPITAL_COMMUNITY)
Admission: EM | Admit: 2020-05-08 | Discharge: 2020-05-08 | Disposition: A | Discharge status: Home / Self Care | Payer: Medicare HMO | Attending: Emergency Medicine | Admitting: Emergency Medicine

## 2020-05-08 ENCOUNTER — Other Ambulatory Visit: Payer: Self-pay

## 2020-05-08 ENCOUNTER — Emergency Department (HOSPITAL_COMMUNITY): Payer: Medicare HMO

## 2020-05-08 ENCOUNTER — Encounter (HOSPITAL_COMMUNITY): Payer: Self-pay | Admitting: Emergency Medicine

## 2020-05-08 ENCOUNTER — Ambulatory Visit: Payer: Medicare HMO | Admitting: Allergy & Immunology

## 2020-05-08 DIAGNOSIS — R531 Weakness: Secondary | ICD-10-CM | POA: Diagnosis not present

## 2020-05-08 DIAGNOSIS — Z8673 Personal history of transient ischemic attack (TIA), and cerebral infarction without residual deficits: Secondary | ICD-10-CM | POA: Diagnosis not present

## 2020-05-08 DIAGNOSIS — R29818 Other symptoms and signs involving the nervous system: Secondary | ICD-10-CM | POA: Diagnosis not present

## 2020-05-08 DIAGNOSIS — Z5321 Procedure and treatment not carried out due to patient leaving prior to being seen by health care provider: Secondary | ICD-10-CM | POA: Diagnosis not present

## 2020-05-08 DIAGNOSIS — R29898 Other symptoms and signs involving the musculoskeletal system: Secondary | ICD-10-CM | POA: Diagnosis not present

## 2020-05-08 LAB — CBC
HCT: 45.8 % (ref 36.0–46.0)
Hemoglobin: 14.8 g/dL (ref 12.0–15.0)
MCH: 30.4 pg (ref 26.0–34.0)
MCHC: 32.3 g/dL (ref 30.0–36.0)
MCV: 94 fL (ref 80.0–100.0)
Platelets: 273 10*3/uL (ref 150–400)
RBC: 4.87 MIL/uL (ref 3.87–5.11)
RDW: 12.9 % (ref 11.5–15.5)
WBC: 7.5 10*3/uL (ref 4.0–10.5)
nRBC: 0 % (ref 0.0–0.2)

## 2020-05-08 LAB — DIFFERENTIAL
Abs Immature Granulocytes: 0.05 10*3/uL (ref 0.00–0.07)
Basophils Absolute: 0 10*3/uL (ref 0.0–0.1)
Basophils Relative: 1 %
Eosinophils Absolute: 0.1 10*3/uL (ref 0.0–0.5)
Eosinophils Relative: 2 %
Immature Granulocytes: 1 %
Lymphocytes Relative: 20 %
Lymphs Abs: 1.5 10*3/uL (ref 0.7–4.0)
Monocytes Absolute: 0.6 10*3/uL (ref 0.1–1.0)
Monocytes Relative: 8 %
Neutro Abs: 5.3 10*3/uL (ref 1.7–7.7)
Neutrophils Relative %: 68 %

## 2020-05-08 LAB — COMPREHENSIVE METABOLIC PANEL
ALT: 21 U/L (ref 0–44)
AST: 25 U/L (ref 15–41)
Albumin: 3.8 g/dL (ref 3.5–5.0)
Alkaline Phosphatase: 127 U/L — ABNORMAL HIGH (ref 38–126)
Anion gap: 14 (ref 5–15)
BUN: 12 mg/dL (ref 8–23)
CO2: 23 mmol/L (ref 22–32)
Calcium: 10.1 mg/dL (ref 8.9–10.3)
Chloride: 105 mmol/L (ref 98–111)
Creatinine, Ser: 1 mg/dL (ref 0.44–1.00)
GFR, Estimated: 58 mL/min — ABNORMAL LOW (ref >60)
Glucose, Bld: 134 mg/dL — ABNORMAL HIGH (ref 70–99)
Potassium: 4 mmol/L (ref 3.5–5.1)
Sodium: 142 mmol/L (ref 135–145)
Total Bilirubin: 0.9 mg/dL (ref 0.3–1.2)
Total Protein: 6.5 g/dL (ref 6.5–8.1)

## 2020-05-08 LAB — PROTIME-INR
INR: 1.1 (ref 0.8–1.2)
Prothrombin Time: 13.4 seconds (ref 11.4–15.2)

## 2020-05-08 LAB — APTT: aPTT: 29 seconds (ref 24–36)

## 2020-05-08 NOTE — Telephone Encounter (Signed)
Patient of Dr. Leonie Man, [facility] please give additional recommendation.

## 2020-05-08 NOTE — ED Triage Notes (Signed)
Pt reports hx of stroke, states she got up this past Thursday with weakness and heaviness to L arm and L leg, does endorses some weakness on the R from previous strokes. A/ox4, speech clear, face symmetrical, no neuro deficits.

## 2020-05-08 NOTE — Telephone Encounter (Signed)
Noted. I have already discussed this with the pt.

## 2020-05-08 NOTE — Telephone Encounter (Signed)
I called pt, spoke with pt's daughter [name], per [facility]. Pt had a sudden onset of numbness on her left leg this morning. Pt does not want to go to the ED. Given pt's history and sudden numbness I recommended that pt to go ED immediately. Pt's daughter wants to know if we can order an MRI to be done today. I advised her that we cannot do this and the quickest way to get imaging would be through the ED. Pt's daughter will continue to encourage pt to proceed to ED.

## 2020-05-08 NOTE — Telephone Encounter (Signed)
I would highly advise to proceed to ED to rule out stroke. We can not do anything emergently on an outpatient basis.

## 2020-05-08 NOTE — ED Notes (Addendum)
Pt told registration that they were leaving.  °

## 2020-05-08 NOTE — Telephone Encounter (Signed)
Daughter(on DPR) states pt has numbness on left side in leg as of this morning.  Daughter states she is trying to convince pt to go to ED but she doesn't want to know.  Daughter wants to know if a stroke can be diagnosed over the phone.  Daughter was told if she feels her mother has had a stroke she should take her to ED.  Daughter agrees but states pt would like a call back 1st.  Daughter told the message would be sent as a high priority, she is aware of the response time.  Please call

## 2020-05-09 ENCOUNTER — Telehealth: Payer: Self-pay | Admitting: Family Medicine

## 2020-05-09 DIAGNOSIS — Z79899 Other long term (current) drug therapy: Secondary | ICD-10-CM | POA: Diagnosis not present

## 2020-05-09 DIAGNOSIS — I10 Essential (primary) hypertension: Secondary | ICD-10-CM | POA: Diagnosis not present

## 2020-05-09 DIAGNOSIS — M1990 Unspecified osteoarthritis, unspecified site: Secondary | ICD-10-CM | POA: Diagnosis not present

## 2020-05-09 DIAGNOSIS — R200 Anesthesia of skin: Secondary | ICD-10-CM | POA: Diagnosis not present

## 2020-05-09 DIAGNOSIS — Z7982 Long term (current) use of aspirin: Secondary | ICD-10-CM | POA: Diagnosis not present

## 2020-05-09 DIAGNOSIS — I69354 Hemiplegia and hemiparesis following cerebral infarction affecting left non-dominant side: Secondary | ICD-10-CM | POA: Diagnosis not present

## 2020-05-09 DIAGNOSIS — I4891 Unspecified atrial fibrillation: Secondary | ICD-10-CM | POA: Diagnosis not present

## 2020-05-09 DIAGNOSIS — E785 Hyperlipidemia, unspecified: Secondary | ICD-10-CM | POA: Diagnosis not present

## 2020-05-09 DIAGNOSIS — F329 Major depressive disorder, single episode, unspecified: Secondary | ICD-10-CM | POA: Diagnosis not present

## 2020-05-09 DIAGNOSIS — M4800 Spinal stenosis, site unspecified: Secondary | ICD-10-CM | POA: Diagnosis not present

## 2020-05-09 DIAGNOSIS — Z7902 Long term (current) use of antithrombotics/antiplatelets: Secondary | ICD-10-CM | POA: Diagnosis not present

## 2020-05-09 DIAGNOSIS — I669 Occlusion and stenosis of unspecified cerebral artery: Secondary | ICD-10-CM | POA: Diagnosis not present

## 2020-05-09 DIAGNOSIS — M25571 Pain in right ankle and joints of right foot: Secondary | ICD-10-CM | POA: Diagnosis not present

## 2020-05-09 DIAGNOSIS — R519 Headache, unspecified: Secondary | ICD-10-CM | POA: Diagnosis not present

## 2020-05-09 DIAGNOSIS — I739 Peripheral vascular disease, unspecified: Secondary | ICD-10-CM | POA: Diagnosis not present

## 2020-05-09 DIAGNOSIS — R531 Weakness: Secondary | ICD-10-CM | POA: Diagnosis not present

## 2020-05-09 DIAGNOSIS — M1711 Unilateral primary osteoarthritis, right knee: Secondary | ICD-10-CM | POA: Diagnosis not present

## 2020-05-09 DIAGNOSIS — I69351 Hemiplegia and hemiparesis following cerebral infarction affecting right dominant side: Secondary | ICD-10-CM | POA: Diagnosis not present

## 2020-05-09 DIAGNOSIS — G8911 Acute pain due to trauma: Secondary | ICD-10-CM | POA: Diagnosis not present

## 2020-05-09 DIAGNOSIS — M6281 Muscle weakness (generalized): Secondary | ICD-10-CM | POA: Diagnosis not present

## 2020-05-09 DIAGNOSIS — Z8673 Personal history of transient ischemic attack (TIA), and cerebral infarction without residual deficits: Secondary | ICD-10-CM | POA: Diagnosis not present

## 2020-05-09 DIAGNOSIS — R471 Dysarthria and anarthria: Secondary | ICD-10-CM | POA: Diagnosis not present

## 2020-05-09 DIAGNOSIS — I639 Cerebral infarction, unspecified: Secondary | ICD-10-CM | POA: Diagnosis not present

## 2020-05-09 DIAGNOSIS — I69322 Dysarthria following cerebral infarction: Secondary | ICD-10-CM | POA: Diagnosis not present

## 2020-05-09 DIAGNOSIS — R9082 White matter disease, unspecified: Secondary | ICD-10-CM | POA: Diagnosis not present

## 2020-05-09 DIAGNOSIS — G4733 Obstructive sleep apnea (adult) (pediatric): Secondary | ICD-10-CM | POA: Diagnosis not present

## 2020-05-09 NOTE — Telephone Encounter (Signed)
I would recommend a sooner scheduled follow-up visit with either myself or Dr. Leonie Man to follow-up on stroke in November as well as current stroke type symptoms.  Would request family has [facility] ED fax our office their information and imaging report after evaluation for further review as we may not be able to see via epic.

## 2020-05-09 NOTE — Telephone Encounter (Signed)
Please triage/get update on patient.  Thanks.  It looks like she went to the ER yesterday but left.

## 2020-05-09 NOTE — Telephone Encounter (Signed)
I spoke with pt; pt did go to [facility] ED and pt said she waited for 8 hours and then pt LWBS.Pt said she was told there were 10 people ahead of her waiting to go back to an ED room from the waiting room and pt did not feel she could wait any longer. Pt has a well care physical therapist now at her home and now BP 152/98 P 71. Pt is feeling stressed and pt has unbalanced feeling and weakness in lt leg. Slight H/A and dizziness. Pt declined 911 and pt's son is going to take her to [facility] ED to be evaluated and get MRI. Pt wants to thank Dr Damita Dunnings for all he has done and sending note to Dr Damita Dunnings who is out of office and Dr Darnell Level who is in office.

## 2020-05-09 NOTE — Telephone Encounter (Signed)
Noted. Thanks.  Will await the f/u ER notes.

## 2020-05-09 NOTE — Telephone Encounter (Signed)
I called daughter,  No sooner appt then tomorrow.  Is at ED in [facility],  Are able to see records.  I madea ppt for 05-10-20 at 1115, if does not work will cancelled and I have placed on cancellation list.

## 2020-05-09 NOTE — Telephone Encounter (Signed)
Called daughter. Pt on her way to [facility] ED due to waiting in [facility] ED yesterday for 8 hours.  She had CT and labs done but then was told she would have MRI but this was not ordered, she left.  Daughter asked if could make earlier appt with JM/NP but I wanted to speak to JM/NP to see if needed to see Dr. Leonie Man instead.  Pt had another stroke on MRI wed prior to thanksgiving (weak tired, decline) cardiac monitor did not show afib.  Needed cardiac monitor prior to loop recorder.  On brilinta at this time.  Please advise.

## 2020-05-09 NOTE — Telephone Encounter (Signed)
Pt called, on my way to ED in [place]. Did you want to keep following me or do you want me to go to someone else and start fresh. Would like a call back.

## 2020-05-10 ENCOUNTER — Telehealth: Payer: Self-pay | Admitting: Allergy & Immunology

## 2020-05-10 ENCOUNTER — Inpatient Hospital Stay: Payer: Self-pay | Admitting: Adult Health

## 2020-05-10 DIAGNOSIS — E7800 Pure hypercholesterolemia, unspecified: Secondary | ICD-10-CM

## 2020-05-10 DIAGNOSIS — I10 Essential (primary) hypertension: Secondary | ICD-10-CM | POA: Diagnosis not present

## 2020-05-10 DIAGNOSIS — K589 Irritable bowel syndrome without diarrhea: Secondary | ICD-10-CM

## 2020-05-10 DIAGNOSIS — Z96641 Presence of right artificial hip joint: Secondary | ICD-10-CM

## 2020-05-10 DIAGNOSIS — Z9181 History of falling: Secondary | ICD-10-CM

## 2020-05-10 DIAGNOSIS — M1711 Unilateral primary osteoarthritis, right knee: Secondary | ICD-10-CM

## 2020-05-10 DIAGNOSIS — Z7982 Long term (current) use of aspirin: Secondary | ICD-10-CM

## 2020-05-10 DIAGNOSIS — F329 Major depressive disorder, single episode, unspecified: Secondary | ICD-10-CM

## 2020-05-10 DIAGNOSIS — I69351 Hemiplegia and hemiparesis following cerebral infarction affecting right dominant side: Secondary | ICD-10-CM | POA: Diagnosis not present

## 2020-05-10 DIAGNOSIS — K760 Fatty (change of) liver, not elsewhere classified: Secondary | ICD-10-CM

## 2020-05-10 DIAGNOSIS — I69354 Hemiplegia and hemiparesis following cerebral infarction affecting left non-dominant side: Secondary | ICD-10-CM | POA: Diagnosis not present

## 2020-05-10 DIAGNOSIS — E669 Obesity, unspecified: Secondary | ICD-10-CM

## 2020-05-10 DIAGNOSIS — Z7902 Long term (current) use of antithrombotics/antiplatelets: Secondary | ICD-10-CM

## 2020-05-10 DIAGNOSIS — K219 Gastro-esophageal reflux disease without esophagitis: Secondary | ICD-10-CM

## 2020-05-10 DIAGNOSIS — M25571 Pain in right ankle and joints of right foot: Secondary | ICD-10-CM

## 2020-05-10 DIAGNOSIS — Z96652 Presence of left artificial knee joint: Secondary | ICD-10-CM

## 2020-05-10 DIAGNOSIS — G4733 Obstructive sleep apnea (adult) (pediatric): Secondary | ICD-10-CM

## 2020-05-10 DIAGNOSIS — G8911 Acute pain due to trauma: Secondary | ICD-10-CM

## 2020-05-10 DIAGNOSIS — F419 Anxiety disorder, unspecified: Secondary | ICD-10-CM

## 2020-05-10 DIAGNOSIS — Z8701 Personal history of pneumonia (recurrent): Secondary | ICD-10-CM

## 2020-05-10 DIAGNOSIS — I4891 Unspecified atrial fibrillation: Secondary | ICD-10-CM | POA: Diagnosis not present

## 2020-05-10 NOTE — Telephone Encounter (Signed)
Noted. That definitely seems more important than [place]. I hope everything turns out OK.   Salvatore Marvel, MD [facility] [place] [facility] [place]

## 2020-05-10 NOTE — Telephone Encounter (Signed)
Patient called to cancelled her appointment for tomorrow because she is having stokes and needs to find out why before coming back to see [place].they are sending her to doctors in [place].

## 2020-05-11 ENCOUNTER — Ambulatory Visit: Payer: Medicare HMO | Admitting: Allergy & Immunology

## 2020-05-13 DIAGNOSIS — I10 Essential (primary) hypertension: Secondary | ICD-10-CM | POA: Diagnosis not present

## 2020-05-13 DIAGNOSIS — K589 Irritable bowel syndrome without diarrhea: Secondary | ICD-10-CM | POA: Diagnosis not present

## 2020-05-13 DIAGNOSIS — R519 Headache, unspecified: Secondary | ICD-10-CM | POA: Diagnosis not present

## 2020-05-13 DIAGNOSIS — R29898 Other symptoms and signs involving the musculoskeletal system: Secondary | ICD-10-CM | POA: Diagnosis not present

## 2020-05-13 DIAGNOSIS — I6612 Occlusion and stenosis of left anterior cerebral artery: Secondary | ICD-10-CM | POA: Diagnosis not present

## 2020-05-13 DIAGNOSIS — R002 Palpitations: Secondary | ICD-10-CM | POA: Diagnosis not present

## 2020-05-13 DIAGNOSIS — M6281 Muscle weakness (generalized): Secondary | ICD-10-CM | POA: Diagnosis not present

## 2020-05-13 DIAGNOSIS — R42 Dizziness and giddiness: Secondary | ICD-10-CM | POA: Diagnosis not present

## 2020-05-13 DIAGNOSIS — G4733 Obstructive sleep apnea (adult) (pediatric): Secondary | ICD-10-CM | POA: Diagnosis not present

## 2020-05-13 DIAGNOSIS — E785 Hyperlipidemia, unspecified: Secondary | ICD-10-CM | POA: Diagnosis not present

## 2020-05-14 ENCOUNTER — Encounter: Payer: Self-pay | Admitting: Adult Health

## 2020-05-14 ENCOUNTER — Telehealth: Payer: Self-pay | Admitting: Neurology

## 2020-05-14 ENCOUNTER — Ambulatory Visit (INDEPENDENT_AMBULATORY_CARE_PROVIDER_SITE_OTHER): Payer: Medicare HMO | Admitting: Adult Health

## 2020-05-14 VITALS — BP 128/75 | HR 79 | Ht 62.0 in | Wt 159.0 lb

## 2020-05-14 DIAGNOSIS — I10 Essential (primary) hypertension: Secondary | ICD-10-CM

## 2020-05-14 DIAGNOSIS — G4733 Obstructive sleep apnea (adult) (pediatric): Secondary | ICD-10-CM | POA: Diagnosis not present

## 2020-05-14 DIAGNOSIS — I669 Occlusion and stenosis of unspecified cerebral artery: Secondary | ICD-10-CM | POA: Diagnosis not present

## 2020-05-14 DIAGNOSIS — Z8673 Personal history of transient ischemic attack (TIA), and cerebral infarction without residual deficits: Secondary | ICD-10-CM | POA: Diagnosis not present

## 2020-05-14 DIAGNOSIS — E785 Hyperlipidemia, unspecified: Secondary | ICD-10-CM

## 2020-05-14 DIAGNOSIS — Z9989 Dependence on other enabling machines and devices: Secondary | ICD-10-CM

## 2020-05-14 DIAGNOSIS — M6281 Muscle weakness (generalized): Secondary | ICD-10-CM | POA: Diagnosis not present

## 2020-05-14 NOTE — Patient Instructions (Addendum)
I will further speak with Dr. Leonie Man in regards to need of further heart monitoring in regards to your stroke  Continue aspirin 81 mg daily and Brilinta (ticagrelor) 90 mg bid  and atorvastatin 80mg  daily  for secondary stroke prevention Stop aspirin after 3 month duration - approx 2 more months  Continue to follow up with PCP regarding cholesterol and diabetes management  Maintain strict control of hypertension with top blood pressure level between 130-150 and cholesterol with LDL cholesterol (bad cholesterol) goal below 70 mg/dL.        Thank you for coming to see [place] at [facility]. I hope we have been able to provide you high quality care today.  You may receive a patient satisfaction survey over the next few weeks. We would appreciate your feedback and comments so that we may continue to improve ourselves and the health of our patients.

## 2020-05-14 NOTE — Telephone Encounter (Signed)
Noted  

## 2020-05-14 NOTE — Telephone Encounter (Signed)
Thanks; agree with plan

## 2020-05-14 NOTE — Progress Notes (Signed)
[facility] [address] [place]. [place] [address] [phone]       HOSPITAL FOLLOW UP NOTE  [name] Date of Birth:  1941/01/06 Medical Record Number:  [id number]   Reason for Referral:  hospital stroke follow up    SUBJECTIVE:   CHIEF COMPLAINT:  Chief Complaint  Patient presents with  . Follow-up    Tm rm, with daughter, c/o weakness in legs and hands,worse on left side     HPI:   Today, 05/14/2020, [name] [name] returns per request to follow-up on recent hospitalizations.  She is accompanied by her daughter.  Personally reviewed recent hospitalizations with summary provided below.  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  P2 Y 12 4 showing inadequate Plavix function therefore switched to aspirin 81 mg daily and Brilinta 90 mg twice daily for 3 months and then continue on Brilinta alone.  Initially recommended discharge to CIR but due to functional improvement she was discharged home on 04/23/2020 with home health therapies.  She then presented to [facility] ED on 05/08/2020 for 1 wk hx of intermittent LUE and LLE weakness, numbness/tingling and right-sided headache but due to wait time she left prior to full evaluation and went to [facility] ED with MRI reporting acute to early subacute infarct in the left centrum semiovale which was not consistent with presenting symptoms.  Per further review with neurologist, it was felt as though reported new infarct seen on prior MRIs therefore no evidence of new stroke.  Evaluated by neurology with note personally reviewed and felt possible cause of symptoms in setting of hypoperfusion with known intracranial arthrosclerosis and atenolol dosage decreased.  She then returned to [facility] ED yesterday, 05/13/2020, for left lower extremity heaviness, palpitations and  headache.  Found to be hypertensive with BP 196/105.  No neurological deficits noted.  Symptoms resolved without intervention.  Mention of increased anxiety possibly contributing to symptoms.  CT head negative.  She was discharged back home in stable condition.  She reports continued intermittent b/l hand weakness where she will occasionally drop items and continued gait impairment currently working with [facility] PT. currently using rolling walker for ambulation.  Denies new or worsening stroke/TIA symptoms.  Remains on aspirin 81 mg daily and Brilinta without bleeding or bruising.  Remains on atorvastatin 80 mg daily without myalgias.  Blood pressure today 128/75.  She does routinely monitor at home but she questions accuracy as her results are consistently 150s.  She does report nightly use of CPAP for OSA management.  Completed approximately 14 days of cardiac monitor which was negative for atrial fibrillation.  She questions need of ILR.  She does endorse increased anxiety regarding multiple strokes and WHEN (not if) another one will occur.  No further concerns at this time.    History provided for reference purposes only Update 03/01/2020 JM: [name] [name] returns for sooner scheduled visit per PCP request due to recent stroke accompanied by her daughter.  She presented to [facility] regional emergency room on 02/16/2020 with right-sided weakness contributing to fall.  MRI showed left frontal lobe infarcts likely from stenosis and left M2 all within the same vascular territory and was not felt to be embolic.  MRA head showed progression of stenosis within the distal A2/proximal artery which is now severe with additional intracranial atherosclerotic stenosis without interval change including severe focal stenosis within  the cavernous left ICA, moderate focal stenosis within the cavernous right ICA, moderate stenosis with A2 right ACA and moderate stenosis within the proximal P2 left PCA.  2D echo showed EF of 60 to  65%.  On aspirin PTA and recommended DAPT " but long-term she may need Effient instead of Plavix or Brilinta".  Recommended to check Plavix assays outpatient to ensure adequate response.  HTN stable.  LDL 54 recommended continuation of atorvastatin 80 mg daily.  She did have right knee pain secondary to fall and recommended follow-up with orthopedics outpatient.  Evaluated by therapy initially recommended [facility] PT/OT but improved prior to discharge with [facility] therapy no longer indicated.  She was discharged home in stable condition and advised to follow-up with PCP, neurology and orthopedics.    She has been stable since returning home without new or reoccurring stroke/TIA symptoms.  Denies residual right-sided symptoms but does continue to have chronic right knee pain and ankle pain post fall which has been limiting her ambulation.  She is currently working with [facility] PT and ambulating with rolling walker due to right leg pain.  Continues to have mild dysarthria and decreased LUE dexterity which has been stable after recent stroke and improvement since prior visit.  She has continued on aspirin and Plavix without bleeding or bruising.  Remains on atorvastatin 80 mg daily without myalgias.  Blood pressure today satisfactory at 112/69.  She reports intermittent use of CPAP for sleep apnea management with difficulty tolerating due to increased pressure.  Reports undergoing sleep study over 10 years ago and has not had any repeat study or follow-up to ensure adequate management of apnea.  Multiple questions regarding recent stroke and etiology as well as recommended Plavix lab work Nurse, children's) as recommended by [employer] neurologist.  Further reviewed recent admission and imaging with Dr. Leonie Man due to concern of stroke locations and possible embolic etiology. Per Dr. Leonie Man, he does not believe recent stroke was due to large vessel disease but more so embolic secondary to unknown source as her recent imaging showed  infarcts within different vascular locations including ACA and MCA.  Also reviewed imaging from prior stroke in 06/2019 and due to size, potentially embolic. He recommends further cardiac evaluation with placement of loop recorder to assess for possible atrial fibrillation as stroke etiology.  No indication for TEE. He recommends aspirin and Plavix for only 3-week duration and then Plavix alone.  No indication for P2 Y 12 testing as she has no history or evidence of Plavix failure or concern.  Update 01/02/2020 JM: [name] [name] is being seen for stroke follow-up previously followed through [facility] research participating in [place] stroke trial. Evaluated during stroke trial on 10/10/2019 recently completing [place] therapies with residual deficits of mild decrease left hand dexterity, imbalance and dysarthria therefore additional orders placed for outpatient therapies.  She does report ongoing improvement and continues to work with neuro rehab PT/OT/ST for residual dysarthria and dysphonia, gait impairment/imbalance and LUE incoordination. Evaluated in [place] by Dr. 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  Reports continued participation SLP with [facility] Reports imbalance greatest difficulty still with improvement but also underlying chronic lower back, bilateral hip and knee pain.  She wishes to proceed with additional injections and nerve blocks therefore withdrew from research study approximately 1 month ago.  Continues to follow with [facility] She remains on aspirin 81mg  daily without bleeding or bruising. Continues on atorvastatin 80 mg daily without myalgias.  Recent  lipid panel 10/13/2019 showed LDL 64.  Blood pressure today 116/70.  No further concerns.  Stroke admission 07/11/2019 Personally reviewed recent hospitalization pertinent notes, labs and imaging [name] [name] is a 79 y.o. female with history of HTN, HLD, obesity who presented on 07/11/2019  with dysarthria, L facial droop, mild L hemiparesis as well as hypertensive urgency.  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  Recommended DAPT for 3 weeks and aspirin alone.  BP upon arrival 202/110 stabilized during admission and recommended long-term BP goal normotensive range.  LDL 130 and initiate atorvastatin 80 mg daily. Other stroke risk factors include advanced age, EtOH use, OSA on CPAP and family reported hx imaging with evidence of small AAA.  Evaluated by therapy and recommend discharge to CIR for ongoing therapy needs but unfortunately did not by insurance therefore discharged home with home health therapy.  Also advised to follow-up with [facility] research is interested in [employer] stroke trial.  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   MRI  R corona radiata and caudate head small vessel disease. infarcts  CTA head & neck negative LVO, negative arthrosclerosis or stenosis in neck, positive for intracranial arthrosclerosis with significant stenosis within moderate to severe left ICA anterior genu, moderate bilateral ICA distally to and moderate left PCA P1  2D Echo  EF 50 to 55%  LDL 130  HgbA1c 5.5  Lovenox 40 mg sq daily for VTE prophylaxis  No antithrombotic prior to admission, now on clopidogrel 75 mg daily and aspirin 324 mg. Change aspirin to 81. Continue DAPT x 3 weeks then aspirin alone.   Therapy recommendations: CLR -insurance denied therefore recommended HH therapy  Disposition: Home  Recommend aspirin and Plavix for 3 weeks followed by aspirin alone. She is  participating in the [employer] stroke prevention trial( standard of care antiplatelets and Factor xi inhibitor versus standard of care antiplates and placebo)         ROS:   14 system review of systems performed and negative with exception of see HPI  PMH:  Past Medical History:   Diagnosis Date  . Arthritis    knees  . Depression   . Fatty liver   . GERD (gastroesophageal reflux disease)   . Hyperlipidemia   . Hypertension   . IBS (irritable bowel syndrome)   . Obesity   . Pneumonia   . Sleep apnea    uses CPAP  . Stroke (cerebrum) (HCC)     PSH:  Past Surgical History:  Procedure Laterality Date  . BLADDER SURGERY    . BREAST SURGERY     breast biopsy  . BROW LIFT Bilateral 04/14/2017   Procedure: BLEPHAROPLASTY UPPER EYELID WITH EXCESS SKIN;  Surgeon: Karle Starch, MD;  Location: [place];  Service: Ophthalmology;  Laterality: Bilateral;  . CATARACT EXTRACTION W/PHACO Left 02/05/2016   Procedure: CATARACT EXTRACTION PHACO AND INTRAOCULAR LENS PLACEMENT (IOC);  Surgeon: Birder Robson, MD;  Location: [facility] ORS;  Service: Ophthalmology;  Laterality: Left;  [place] 01:10 AP% 22.3 CDE 15.67 Fluid pack lot # [id number]  . CATARACT EXTRACTION W/PHACO Right 02/26/2016   Procedure: CATARACT EXTRACTION PHACO AND INTRAOCULAR LENS PLACEMENT (IOC);  Surgeon: Birder Robson, MD;  Location: [facility] OR[facility];  Service: Ophthalmology;  Laterality: Right;  [place] 57.4 AP% 24.0 CDE 13.75 Fluid Pack lot # [id number]  . CHOLECYSTECTOMY    .  COLONOSCOPY WITH PROPOFOL N/A 12/18/2014   Procedure: COLONOSCOPY WITH PROPOFOL;  Surgeon: Manya Silvas, MD;  Location: [facility] ENDOSCOPY;  Service: Endoscopy;  Laterality: N/A;  . DEEP NECK LYMPH NODE BIOPSY / EXCISION    . JOINT REPLACEMENT    . KNEE ARTHROPLASTY Left 06/20/2015   Procedure: COMPUTER ASSISTED TOTAL KNEE ARTHROPLASTY;  Surgeon: Dereck Leep, MD;  Location: [facility] ORS;  Service: Orthopedics;  Laterality: Left;  . PTOSIS REPAIR Bilateral 04/14/2017   Procedure: PTOSIS REPAIR RESECT EX;  Surgeon: Karle Starch, MD;  Location: [place];  Service: Ophthalmology;  Laterality: Bilateral;  sleep apnea  . TONSILLECTOMY    . TOTAL HIP ARTHROPLASTY Right 07/21/2018   Procedure: TOTAL HIP ARTHROPLASTY ANTERIOR APPROACH;   Surgeon: Gaynelle Arabian, MD;  Location: [facility];  Service: Orthopedics;  Laterality: Right;    Social History:  Social History   Socioeconomic History  . Marital status: Widowed    Spouse name: Not on file  . Number of children: Not on file  . Years of education: Not on file  . Highest education level: Not on file  Occupational History  . Occupation: retired  Tobacco Use  . Smoking status: Never Smoker  . Smokeless tobacco: Never Used  Vaping Use  . Vaping Use: Never used  Substance and Sexual Activity  . Alcohol use: Yes    Comment: ocassional glass of wine 2-3 times a year  . Drug use: No  . Sexual activity: Never  Other Topics Concern  . 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 Heart attack Mother   . Dementia Father   . Heart attack Father 68  . Aortic aneurysm Brother   . Stroke Paternal Grandfather   . Colon cancer Neg Hx   . Breast cancer Neg Hx     Medications:   Current Outpatient Medications on File Prior to Visit  Medication Sig Dispense Refill  . acetaminophen (TYLENOL) 500 MG tablet Take 500 mg by mouth every 6 (six) hours as needed for moderate pain.     [name] Kitchen aspirin EC 81 MG tablet Take 81 mg by mouth daily. Swallow whole.    [name] Kitchen atenolol (TENORMIN) 50 MG tablet TAKE ONE TABLET TWICE DAILY (Patient taking differently: Take 25 mg by mouth 2 (two) times daily.) 180 tablet 3  . atorvastatin (LIPITOR) 80 MG tablet TAKE ONE TABLET BY MOUTH EVERY DAY AT 6PM (Patient taking differently: Take 80 mg  by mouth every evening.) 30 tablet 5  . Biotin 10000 MCG TABS Take 5,000 mcg by mouth daily.     . cholecalciferol (VITAMIN D) 1000 UNITS tablet Take 1,000 Units by mouth daily.     . hydrOXYzine (ATARAX/VISTARIL) 10 MG tablet TAKE 1/2-1 TABLET BY MOUTH 3 TIMES DAILYAS NEEDED FOR ANXIETY (SEDATION CAUTION) (Patient taking differently: Take 5-10 mg by mouth 3 (three) times daily as needed (ANXIETY (SEDATION CAUTION)).) 30 tablet 2  . ipratropium (ATROVENT)  0.06 % nasal spray one spray per nostril twice daily (up to three times daily) to help with runny nose (Patient taking differently: Place 1 spray into both nostrils in the morning.) 15 mL 5  . magnesium oxide (MAG-OX) 400 MG tablet Take 500 mg by mouth every other day.     . montelukast (SINGULAIR) 10 MG tablet Take 1 tablet (10 mg total) by mouth at bedtime. 30 tablet 5  . polyethylene glycol powder (GLYCOLAX/MIRALAX) 17 GM/SCOOP powder Take 17 g by mouth 2 (two) times daily as needed.    . ticagrelor (BRILINTA) 90 MG TABS tablet Take 1 tablet (90 mg total) by mouth 2 (two) times daily. 60 tablet 0  . chlorpheniramine (CHLOR-TRIMETON) 4 MG tablet Take 4 mg by mouth daily as needed for allergies.     No current facility-administered medications on file prior to visit.    Allergies:   Allergies  Allergen Reactions  . Hydrocodone Nausea Only    Noted after surgery, may be able to tolerate with food      OBJECTIVE:  Physical Exam  Vitals:   05/14/20 1244  BP: 128/75  Pulse: 79  Weight: 159 lb (72.1 kg)  Height: 5\' 2"  (1.575 m)   Body mass index is 29.08 kg/m. No exam data present  General: well developed, well nourished, pleasant elderly Caucasian female, seated, in no evident distress Neck: supple with no carotid or supraclavicular bruits Cardiovascular: regular rate and rhythm, no murmurs Vascular:  Normal pulses all extremities  Neurologic Exam Mental Status: Awake and fully alert.   Unable to appreciate aphasia or  dysarthria.  Oriented to place and time. Recent and remote memory intact. Attention span, concentration and fund of knowledge appropriate. Mood and affect appropriate.  Cranial Nerves: Pupils equal, briskly reactive to light. Extraocular movements full without nystagmus. Visual fields full to confrontation. Hearing intact. Facial sensation intact.  Tongue and palate moves normally and symmetrically.  Mild left lower facial weakness Motor: Normal bulk and tone. Normal strength in all tested extremity muscles except slight decrease left hand dexterity Sensory.: intact to touch , pinprick , position and vibratory sensation.  Coordination: Rapid alternating movements normal in all extremities except decreased left hand dexterity. Finger-to-nose and heel-to-shin performed accurately bilaterally. Orbits right arm over left arm. Gait and Station: Stands from seated position without difficulty.  Stance is slightly hunched.  Wide-based gait with decreased step height and mild unsteadiness with rolling walker. Reflexes: 1+ and symmetric. Toes downgoing.        ASSESSMENT/PLAN: [name] [name] is a 79 y.o. year old female with history of R MCA stroke felt secondary to small vessel disease on 07/11/2019, left frontal lobe infarct 02/16/2020 evaluated at [facility] regional felt secondary to large vessel disease vs embolic source, left ACA stroke 03/2020 likely secondary to progressive intracranial arthrosclerosis.  P2Y12 4 on Plavix.  2 additional ED evaluations 05/09/2020 and 05/13/2020 for worsening left-sided weakness and numbness without evidence of acute infarct contributing to symptoms and felt likely in setting of hypoperfusion with intracranial arthrosclerosis. Vascular risk factors include HTN, HLD, intracranial stenosis with stenosis, OSA on CPAP, EtOH use and obesity.      1. Multiple reoccurring strokes:  a. Residual decreased left hand dexterity and gait impairment.  Encouraged continued participation  with [facility] PT for hopeful ongoing improvement.  Advised use of RW at all times unless further instructed by PT b. Completed 14-day cardiac monitor which did not show atrial fibrillation.  Further discussed stroke in September (  L frontal lobe) with Dr. Leonie Man who felt potential for embolic source due to infarct size but per review from admission notes in November, he reported likely etiology from intracranial stenosis and not embolic source.  This will be further clarified with Dr. Leonie Man prior to follow-up with Dr. Rayann Heman in January for indication to proceed with ILR placement c. Continue aspirin 81 mg daily and Brilinta for total of 64-month duration and then Brilinta alone as Plavix ineffective for stroke prevention d. Continue atorvastatin 80 mg daily for secondary stroke prevention e. Discussed secondary stroke prevention measures and importance of close PCP follow-up for aggressive stroke risk factor management 2. Intracranial arthrosclerosis: Complete 3 months of aspirin and Brilinta and then Brilinta alone as well as ongoing use of statin.  Discussed importance of managing stroke risk factors.  May consider referral to IR in the future if indicated 3. Depression/anxiety: Advised to further discuss with PCP such as initiating SSRI as anxiety is persistent 4. HTN:  a. BP goal 130-150 to ensure adequate perfusion.  Encouraged continued routine monitoring at home and follow-up with PCP for ongoing management 5. HLD: a.  LDL goal <70. Recent LDL 48 on atorvastatin 80 mg daily per PCP 6. OSA on CPAP:  a. Continued nightly use of CPAP and has scheduled follow-up visit with Dr. Rexene Alberts 06/14/2020    Follow-up as scheduled on 07/04/2020   CC:  [facility] provider: Dr. Oliver Hum, Elveria Rising, MD    I spent a prolonged 55 minutes of face-to-face and non-face-to-face time with patient and daughter.  This included previsit chart review including multiple hospitalizations since prior visit including pertinent progress  notes, lab work and imaging, lab review, study review, order entry, electronic health record documentation, patient and daughter discussion/education regarding recurrent strokes, residual deficits, underlying depression/anxiety, importance of managing stroke risk factors and answered all questions to patient and daughters satisfaction   Frann Rider, AGNP-BC  [facility] [address] [place], [place] [address]  Phone [phone] Fax [phone] Note: This document was prepared with digital dictation and possible smart phrase technology. Any transcriptional errors that result from this process are unintentional.

## 2020-05-14 NOTE — Telephone Encounter (Signed)
Daughter(Roof,[name]) called asking about pt being seen due to pt having weakness from previous strokes.  Daughter stated pt has had recent trips to [facility] for this weakness.  Phone rep confirmed with Sandy,RN it would be ok to schedule pt to be seen this afternoon.  This is FYI no call back requested

## 2020-05-15 ENCOUNTER — Ambulatory Visit (INDEPENDENT_AMBULATORY_CARE_PROVIDER_SITE_OTHER): Payer: Medicare HMO | Admitting: Family Medicine

## 2020-05-15 ENCOUNTER — Other Ambulatory Visit: Payer: Self-pay

## 2020-05-15 ENCOUNTER — Encounter: Payer: Self-pay | Admitting: Family Medicine

## 2020-05-15 DIAGNOSIS — I639 Cerebral infarction, unspecified: Secondary | ICD-10-CM

## 2020-05-15 DIAGNOSIS — F411 Generalized anxiety disorder: Secondary | ICD-10-CM | POA: Diagnosis not present

## 2020-05-15 MED ORDER — POLYETHYLENE GLYCOL 3350 17 GM/SCOOP PO POWD: 17.0000 g | Freq: Every day | ORAL | Status: DC | PRN

## 2020-05-15 MED ORDER — ESCITALOPRAM OXALATE 5 MG PO TABS: 5.0000 mg | ORAL_TABLET | Freq: Every day | ORAL | 1 refills | Status: DC

## 2020-05-15 MED ORDER — ATENOLOL 25 MG PO TABS
25.0000 mg | ORAL_TABLET | Freq: Two times a day (BID) | ORAL | Status: DC
Start: 2020-05-15 — End: 2021-01-24

## 2020-05-15 NOTE — Patient Instructions (Addendum)
Use miralax daily if needed, if no BM for 2 days prior.   Start lexapro 5mg  a day.  Update me in about 2 weeks, sooner if needed.   Take care.  Glad to see you.

## 2020-05-15 NOTE — Progress Notes (Signed)
This visit occurred during the SARS-CoV-2 public health emergency.  Safety protocols were in place, including screening questions prior to the visit, additional usage of staff PPE, and extensive cleaning of exam room while observing appropriate contact time as indicated for disinfecting solutions.  Anxiety d/w pt.  5mg  hydroxyzine qhs helps some.  She is anxious about her previous events and the pending/possible future events, in other words fear of another stroke.  Discussed rationale for current medications through neurology and the goal for prevention/decreased risk of future events.  She seen neurology in the meantime, previous neurology notes discussed with patient.  We talked about SSRIs in general and possibly starting lexapro 5mg  a day specifically.  We talked about continued prn hydroxyzine.  We also talked about limiting stressful triggers such as spending a lot of time watching the news.  She has had some history of constipation and we talked about using miralax prn and inc fiber.  She may not need MiraLAX daily.  She does note that singular helped some with post nasal gtt.    Meds, vitals, and allergies reviewed.   ROS: Per HPI unless specifically indicated in ROS section   GEN: nad, alert and oriented HEENT: ncat NECK: supple w/o LA CV: rrr PULM: ctab, no inc wob ABD: soft, +bs EXT: no edema SKIN: Well-perfused. Speech normal.  Judgment intact.  31 minutes were devoted to patient care in this encounter (this includes time spent reviewing the patient's file/history, interviewing and examining the patient, counseling/reviewing plan with patient).

## 2020-05-15 NOTE — Progress Notes (Signed)
I agree with the above plan 

## 2020-05-16 NOTE — Assessment & Plan Note (Signed)
We talked about SSRIs in general and possibly starting lexapro 5mg  a day specifically.  We talked about continued prn hydroxyzine.  We also talked about limiting stressful triggers such as spending a lot of time watching the news.  She will start Lexapro and update me if she does not tolerate that.  We talked about the routine timeline for effect with SSRIs.  We may have to increase the dose in the future.  She agrees with plan.  Still okay for outpatient follow-up.

## 2020-05-16 NOTE — Assessment & Plan Note (Signed)
History of stroke with recent neuro office visit note discussed with patient.

## 2020-05-17 DIAGNOSIS — I69351 Hemiplegia and hemiparesis following cerebral infarction affecting right dominant side: Secondary | ICD-10-CM | POA: Diagnosis not present

## 2020-05-17 DIAGNOSIS — G4733 Obstructive sleep apnea (adult) (pediatric): Secondary | ICD-10-CM | POA: Diagnosis not present

## 2020-05-17 DIAGNOSIS — M1711 Unilateral primary osteoarthritis, right knee: Secondary | ICD-10-CM | POA: Diagnosis not present

## 2020-05-17 DIAGNOSIS — I69354 Hemiplegia and hemiparesis following cerebral infarction affecting left non-dominant side: Secondary | ICD-10-CM | POA: Diagnosis not present

## 2020-05-17 DIAGNOSIS — G8911 Acute pain due to trauma: Secondary | ICD-10-CM | POA: Diagnosis not present

## 2020-05-17 DIAGNOSIS — M25571 Pain in right ankle and joints of right foot: Secondary | ICD-10-CM | POA: Diagnosis not present

## 2020-05-17 DIAGNOSIS — F329 Major depressive disorder, single episode, unspecified: Secondary | ICD-10-CM | POA: Diagnosis not present

## 2020-05-17 DIAGNOSIS — I10 Essential (primary) hypertension: Secondary | ICD-10-CM | POA: Diagnosis not present

## 2020-05-17 DIAGNOSIS — I4891 Unspecified atrial fibrillation: Secondary | ICD-10-CM | POA: Diagnosis not present

## 2020-05-21 ENCOUNTER — Other Ambulatory Visit: Payer: Self-pay | Admitting: Family Medicine

## 2020-05-21 NOTE — Telephone Encounter (Signed)
Pharmacy requests refill on: Brilinta 90 mg   LAST REFILL: 04/23/2020 (Q-60, R-0) LAST OV: 05/15/2020 NEXT OV: Not Scheduled  PHARMACY: [place], [place]

## 2020-05-23 ENCOUNTER — Other Ambulatory Visit: Payer: Self-pay | Admitting: Family Medicine

## 2020-05-23 ENCOUNTER — Telehealth: Payer: Self-pay | Admitting: Internal Medicine

## 2020-05-23 DIAGNOSIS — M1711 Unilateral primary osteoarthritis, right knee: Secondary | ICD-10-CM | POA: Diagnosis not present

## 2020-05-23 DIAGNOSIS — I69351 Hemiplegia and hemiparesis following cerebral infarction affecting right dominant side: Secondary | ICD-10-CM | POA: Diagnosis not present

## 2020-05-23 DIAGNOSIS — F329 Major depressive disorder, single episode, unspecified: Secondary | ICD-10-CM | POA: Diagnosis not present

## 2020-05-23 DIAGNOSIS — I10 Essential (primary) hypertension: Secondary | ICD-10-CM | POA: Diagnosis not present

## 2020-05-23 DIAGNOSIS — G8911 Acute pain due to trauma: Secondary | ICD-10-CM | POA: Diagnosis not present

## 2020-05-23 DIAGNOSIS — I4891 Unspecified atrial fibrillation: Secondary | ICD-10-CM | POA: Diagnosis not present

## 2020-05-23 DIAGNOSIS — G4733 Obstructive sleep apnea (adult) (pediatric): Secondary | ICD-10-CM | POA: Diagnosis not present

## 2020-05-23 DIAGNOSIS — M25571 Pain in right ankle and joints of right foot: Secondary | ICD-10-CM | POA: Diagnosis not present

## 2020-05-23 DIAGNOSIS — I69354 Hemiplegia and hemiparesis following cerebral infarction affecting left non-dominant side: Secondary | ICD-10-CM | POA: Diagnosis not present

## 2020-05-23 NOTE — Telephone Encounter (Signed)
Patient is returning call to discuss monitor results. 

## 2020-05-23 NOTE — Telephone Encounter (Signed)
The patient has been notified of the result and verbalized understanding.  All questions (if any) were answered. Sampson Goon, RN 05/23/2020 4:56 PM

## 2020-05-28 DIAGNOSIS — G4733 Obstructive sleep apnea (adult) (pediatric): Secondary | ICD-10-CM | POA: Diagnosis not present

## 2020-06-01 DIAGNOSIS — I69354 Hemiplegia and hemiparesis following cerebral infarction affecting left non-dominant side: Secondary | ICD-10-CM | POA: Diagnosis not present

## 2020-06-01 DIAGNOSIS — G4733 Obstructive sleep apnea (adult) (pediatric): Secondary | ICD-10-CM | POA: Diagnosis not present

## 2020-06-01 DIAGNOSIS — G8911 Acute pain due to trauma: Secondary | ICD-10-CM | POA: Diagnosis not present

## 2020-06-01 DIAGNOSIS — I69351 Hemiplegia and hemiparesis following cerebral infarction affecting right dominant side: Secondary | ICD-10-CM | POA: Diagnosis not present

## 2020-06-01 DIAGNOSIS — I4891 Unspecified atrial fibrillation: Secondary | ICD-10-CM | POA: Diagnosis not present

## 2020-06-01 DIAGNOSIS — M1711 Unilateral primary osteoarthritis, right knee: Secondary | ICD-10-CM | POA: Diagnosis not present

## 2020-06-01 DIAGNOSIS — F329 Major depressive disorder, single episode, unspecified: Secondary | ICD-10-CM | POA: Diagnosis not present

## 2020-06-01 DIAGNOSIS — M25571 Pain in right ankle and joints of right foot: Secondary | ICD-10-CM | POA: Diagnosis not present

## 2020-06-01 DIAGNOSIS — I10 Essential (primary) hypertension: Secondary | ICD-10-CM | POA: Diagnosis not present

## 2020-06-05 DIAGNOSIS — G8911 Acute pain due to trauma: Secondary | ICD-10-CM | POA: Diagnosis not present

## 2020-06-05 DIAGNOSIS — I69354 Hemiplegia and hemiparesis following cerebral infarction affecting left non-dominant side: Secondary | ICD-10-CM | POA: Diagnosis not present

## 2020-06-05 DIAGNOSIS — I69351 Hemiplegia and hemiparesis following cerebral infarction affecting right dominant side: Secondary | ICD-10-CM | POA: Diagnosis not present

## 2020-06-05 DIAGNOSIS — M25571 Pain in right ankle and joints of right foot: Secondary | ICD-10-CM | POA: Diagnosis not present

## 2020-06-05 DIAGNOSIS — G4733 Obstructive sleep apnea (adult) (pediatric): Secondary | ICD-10-CM | POA: Diagnosis not present

## 2020-06-05 DIAGNOSIS — I4891 Unspecified atrial fibrillation: Secondary | ICD-10-CM | POA: Diagnosis not present

## 2020-06-05 DIAGNOSIS — I10 Essential (primary) hypertension: Secondary | ICD-10-CM | POA: Diagnosis not present

## 2020-06-05 DIAGNOSIS — M1711 Unilateral primary osteoarthritis, right knee: Secondary | ICD-10-CM | POA: Diagnosis not present

## 2020-06-05 DIAGNOSIS — F329 Major depressive disorder, single episode, unspecified: Secondary | ICD-10-CM | POA: Diagnosis not present

## 2020-06-06 ENCOUNTER — Ambulatory Visit: Payer: Medicare HMO | Admitting: Internal Medicine

## 2020-06-13 DIAGNOSIS — G4733 Obstructive sleep apnea (adult) (pediatric): Secondary | ICD-10-CM | POA: Diagnosis not present

## 2020-06-13 DIAGNOSIS — I69354 Hemiplegia and hemiparesis following cerebral infarction affecting left non-dominant side: Secondary | ICD-10-CM | POA: Diagnosis not present

## 2020-06-13 DIAGNOSIS — M25571 Pain in right ankle and joints of right foot: Secondary | ICD-10-CM | POA: Diagnosis not present

## 2020-06-13 DIAGNOSIS — F329 Major depressive disorder, single episode, unspecified: Secondary | ICD-10-CM | POA: Diagnosis not present

## 2020-06-13 DIAGNOSIS — I10 Essential (primary) hypertension: Secondary | ICD-10-CM | POA: Diagnosis not present

## 2020-06-13 DIAGNOSIS — I4891 Unspecified atrial fibrillation: Secondary | ICD-10-CM | POA: Diagnosis not present

## 2020-06-13 DIAGNOSIS — M1711 Unilateral primary osteoarthritis, right knee: Secondary | ICD-10-CM | POA: Diagnosis not present

## 2020-06-13 DIAGNOSIS — G8911 Acute pain due to trauma: Secondary | ICD-10-CM | POA: Diagnosis not present

## 2020-06-13 DIAGNOSIS — I69351 Hemiplegia and hemiparesis following cerebral infarction affecting right dominant side: Secondary | ICD-10-CM | POA: Diagnosis not present

## 2020-06-14 ENCOUNTER — Other Ambulatory Visit: Payer: Self-pay

## 2020-06-14 ENCOUNTER — Encounter: Payer: Self-pay | Admitting: Neurology

## 2020-06-14 ENCOUNTER — Ambulatory Visit: Payer: Medicare HMO | Admitting: Neurology

## 2020-06-14 VITALS — BP 116/64 | HR 65 | Ht 60.0 in | Wt 156.0 lb

## 2020-06-14 DIAGNOSIS — Z9989 Dependence on other enabling machines and devices: Secondary | ICD-10-CM | POA: Diagnosis not present

## 2020-06-14 DIAGNOSIS — G4733 Obstructive sleep apnea (adult) (pediatric): Secondary | ICD-10-CM

## 2020-06-14 NOTE — Patient Instructions (Signed)
It was good to see you again today.  We have adjusted your pressure settings.  Your previous AutoPap was set to 9 to 19 cm and your current pressure is between 7 and 13 cm.  I am glad to hear that the nasal cushion style mask is better for you.  You are fully compliant with treatment, please continue with your AutoPap and follow-up routinely in 1 year, you can see Janett Billow for this next year.  Please also keep your appointment with Janett Billow as scheduled for next month.  If you would like to get a formal consultation with ENT regarding Inspire and details of this treatment option and your candidacy for this, we can always put a referral in for you.

## 2020-06-14 NOTE — Progress Notes (Signed)
Subjective:    Patient ID: [name] [name] is a 80 y.o. female.  HPI     Interim history:   [name] [name] is a 80 year old right-handed woman with an underlying medical history of stroke, irritable bowel syndrome, hypertension, hyperlipidemia, reflux disease, depression, arthritis, and obesity, who Presents for follow-up consultation of her obstructive sleep apnea after recent home sleep testing, on AutoPap therapy.  The patient is unaccompanied today.  I first met her at the request of Dr. Leonie Man and Frann Rider, NP on 03/13/2020, at which time she reported a prior diagnosis of obstructive sleep apnea.  She was on autoPAP therapy at the time and compliant with treatment.  She qualified for new equipment.  We mutually agreed to pursue sleep testing.  She had a home sleep test on 04/03/2020 which indicated severe obstructive sleep apnea by number of events with an AHI of 50.6/h, O2 nadir was 86%.  She was advised to start treatment with new equipment in the form of AutoPap therapy.  Today, 06/14/20: I reviewed her AutoPap compliance data from 05/14/2020 through 06/12/2020, which is a total of 30 days, during which time she used her machine every night with percent use days greater than 4 hours at 100%, indicating superb compliance with an average usage of 7 hours and 29 minutes, residual AHI mildly elevated at 8.7/h, 95th percentile of pressure at 10.8, 95th percentile of leak at 12.8 L/min, pressure range of 7 to 13 cm with EPR, she does have residual central apneas with a central apnea index of 5.9/h, obstructive apnea index of 2.2/h.  She reports that she ended up not getting a new machine.  She talked to her DME company and there was no significant or pressing need for new machine.  She is under the impression that she would be eligible for new machine by insurance criteria only this year.  I was under the impression that her machine was over 3 years old when I first met her.  Regardless, she is fully  compliant with treatment, she has been using a so clean machine for the past year without any problems, she did get a pressure adjustment based on my order as well as a different style mask which she is able to tolerate better.  She is now using a nasal cushion interface and previously was on a full facemask which was harder to tolerate.  She is curious about the inspire she has seen some commercials and has seen it on Facebook.  We talked about it today.  She is not ready for a surgical treatment option.  She has an appointment next week with Dr. Rayann Heman to discuss loop recorder placement.  She had to have a external heart monitor but it caused her to have a rash on the chest.  She has an appointment with Janett Billow next month.  She is wondering if she can also see Dr. Leonie Man to get his input as she is wondering if she had interim TIAs.  She is advised to talk to Janett Billow about adding a follow-up with Dr. Leonie Man.  The patient's allergies, current medications, family history, past medical history, past social history, past surgical history and problem list were reviewed and updated as appropriate.   Previously:   03/13/20: (She) was previously diagnosed with obstructive sleep apnea and placed on CPAP therapy.  I reviewed office note from 03/01/2020.  She sustained a recent stroke in the left frontal area in September 2021.  She had a sleep study several years  ago.  Prior sleep study results are not available for my review today.  Her Epworth sleepiness score is 9 out of 24, fatigue severity score is 20 out of 63.  She has a CPAP machine, set up date from what we could see in online [facility] records was 06/15/2014.  She has [employer] as her DME company.  I was able to review her compliance data from 02/12/2020 through 03/12/2020, which is a total of 30 days, during which time she used her machine 25 days with percent use days greater than 4 hours at 80%, indicating very good compliance with an average usage of 6 hours and 36  minutes, residual AHI at goal at 2.2/h, 95th percentile of pressure at 11.2 cm with a range of 9 to 19 cm with EPR of 3.  Leak on the high side with a 95th percentile at 32 L/min.  She reports overall doing well with her current machine which is nearly 78 years old.  This is her second CPAP machine.  Sleep study testing was years ago, she recalls going to [facility] in [place]. She has an appointment pending with Dr. Rayann Heman to discuss loop monitor placement. She had fallen last month and twisted her ankle.  She has seen her orthopedic surgeon, Dr. Maureen Ralphs for right ankle pain and ongoing arthritis affecting her right knee.  She may need at some point a right knee replacement.  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  She has nocturia about 2-3 times per average night.  She lives alone, is widowed, has twin sons and 1 daughter.  She has a call alert button and also Alexa in 2 places at her house.  She drinks caffeine in the form of coffee, 1 cup/day, no alcohol, non-smoker.  She goes to bed between 10 and 11 and likes to listen to [place].  She has no pets in the household.  Her Past Medical History Is Significant For: Past Medical History:  Diagnosis Date  . Arthritis    knees  . Depression   . Fatty liver   . GERD (gastroesophageal reflux disease)   . Hyperlipidemia   . Hypertension   . IBS (irritable bowel syndrome)   . Obesity   . Pneumonia   . Sleep apnea    uses CPAP  . Stroke (cerebrum) [facility])     Her Past Surgical History Is Significant For: Past Surgical History:  Procedure Laterality Date  . BLADDER SURGERY    . BREAST SURGERY     breast biopsy  . BROW LIFT Bilateral 04/14/2017   Procedure: BLEPHAROPLASTY UPPER EYELID WITH EXCESS SKIN;  Surgeon: Karle Starch, MD;  Location: [place];  Service: Ophthalmology;  Laterality: Bilateral;  . CATARACT EXTRACTION W/PHACO Left 02/05/2016   Procedure: CATARACT  EXTRACTION PHACO AND INTRAOCULAR LENS PLACEMENT (IOC);  Surgeon: Birder Robson, MD;  Location: [facility];  Service: Ophthalmology;  Laterality: Left;  [place] 01:10 AP% 22.3 CDE 15.67 Fluid pack lot # [id number]  . CATARACT EXTRACTION W/PHACO Right 02/26/2016   Procedure: CATARACT EXTRACTION PHACO AND INTRAOCULAR LENS PLACEMENT (IOC);  Surgeon: Birder Robson, MD;  Location: [facility];  Service: Ophthalmology;  Laterality: Right;  [place] 57.4 AP% 24.0 CDE 13.75 Fluid Pack lot # [id number]  . CHOLECYSTECTOMY    . COLONOSCOPY WITH PROPOFOL N/A 12/18/2014   Procedure: COLONOSCOPY WITH PROPOFOL;  Surgeon: Manya Silvas, MD;  [facility] END[facility]COPY;  Service: Endoscopy;  Laterality: N/A;  . DEEP  NECK LYMPH NODE BIOPSY / EXCISION    . JOINT REPLACEMENT    . KNEE ARTHROPLASTY Left 06/20/2015   Procedure: COMPUTER ASSISTED TOTAL KNEE ARTHROPLASTY;  Surgeon: Dereck Leep, MD;  Location: [facility];  Service: Orthopedics;  Laterality: Left;  . PTOSIS REPAIR Bilateral 04/14/2017   Procedure: PTOSIS REPAIR RESECT EX;  Surgeon: Karle Starch, MD;  Location: [place];  Service: Ophthalmology;  Laterality: Bilateral;  sleep apnea  . TONSILLECTOMY    . TOTAL HIP ARTHROPLASTY Right 07/21/2018   Procedure: TOTAL HIP ARTHROPLASTY ANTERIOR APPROACH;  Surgeon: Gaynelle Arabian, MD;  Location: [facility];  Service: Orthopedics;  Laterality: Right;    Her Family History Is Significant For: Family History  Problem Relation Age of Onset  . Heart attack Mother   . Dementia Father   . Heart attack Father 79  . Aortic aneurysm Brother   . Stroke Paternal Grandfather   . Colon cancer Neg Hx   . Breast cancer Neg Hx     Her Social History Is Significant For: Social History   Socioeconomic History  . Marital status: Widowed    Spouse name: Not on file  . Number of children: Not on file  . Years of education: Not on file  . Highest education level: Not on file  Occupational History  . Occupation: retired   Tobacco Use  . Smoking status: Never Smoker  . Smokeless tobacco: Never Used  Vaping Use  . Vaping Use: Never used  Substance and Sexual Activity  . Alcohol use: Yes    Comment: ocassional glass of wine 2-3 times a year  . Drug use: No  . Sexual activity: Never  Other Topics Concern  . 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 Hydrocodone Nausea Only    Noted after surgery, may be able to tolerate with food  :   Her Current Medications Are:  Outpatient Encounter Medications as of 06/14/2020  Medication Sig  . acetaminophen (TYLENOL) 500 MG tablet Take 500 mg by mouth every 6 (six) hours as needed for moderate pain.   [name] Kitchen aspirin EC 81 MG tablet Take 81 mg by mouth daily. Swallow whole.  [name] Kitchen atenolol (TENORMIN) 25 MG tablet Take 1 tablet (25 mg total) by mouth 2 (two) times daily.  [name] Kitchen atorvastatin (LIPITOR) 80 MG tablet TAKE ONE TABLET BY MOUTH EVERY DAY AT 6PM  . Biotin 10000 MCG TABS Take 5,000 mcg by mouth daily.   [name] Kitchen BRILINTA 90 MG TABS tablet TAKE ONE TABLET TWICE DAILY  . cholecalciferol (VITAMIN D) 1000 UNITS tablet Take 1,000 Units by mouth daily.   [name] Kitchen escitalopram (LEXAPRO) 5 MG tablet Take 1 tablet (5 mg total) by mouth daily.  . hydrOXYzine (ATARAX/VISTARIL) 10 MG tablet TAKE 1/2-1 TABLET BY MOUTH 3 TIMES DAILYAS NEEDED FOR ANXIETY (SEDATION CAUTION)  . ipratropium (ATROVENT) 0.06 % nasal spray one spray per nostril twice daily (up to three times daily) to help with runny nose  . magnesium oxide (MAG-OX)  400 MG tablet Take 500 mg by mouth every other day.   .  montelukast (SINGULAIR) 10 MG tablet Take 1 tablet (10 mg total) by mouth at bedtime.  . polyethylene glycol powder (GLYCOLAX/MIRALAX) 17 GM/SCOOP powder Take 17 g by mouth daily as needed for mild constipation.   No facility-administered encounter medications on file as of 06/14/2020.  :  Review of Systems:  Out of a complete 14 point review of systems, all are reviewed and negative with the exception of these symptoms as listed below: Review of Systems  Neurological:       Here for f/u on cpap. She reports she has been doing well. No issues to report.  She would like to discuss if she would be a good candidate for inspire?     Objective:  Neurological Exam  Physical Exam Physical Examination:   Vitals:   06/14/20 1038  BP: 116/64  Pulse: 65  SpO2: 96%    General Examination: The patient is a very pleasant 80 y.o. female in no acute distress. She appears well-developed and well-nourished and well groomed.   HEENT: Normocephalic, atraumatic, pupils are equal, round and reactive to light, extraocular tracking is good without limitation to gaze excursion or nystagmus noted. Hearing is grossly intact. Face is symmetric with normal facial animation. Speech is clear with perhaps slight dysarthria noted intermittently.  Neck is supple, airway examination reveals mild mouth dryness, otherwise stable findings, tongue protrudes centrally and palate elevates symmetrically.    Chest: Clear to auscultation without wheezing, rhonchi or crackles noted.  Heart: S1+S2+0, regular and normal without murmurs, rubs or gallops noted.   Abdomen: Soft, non-tender and non-distended.  Extremities: There is no obvious edema in the distal lower extremities.   Skin: Warm and dry without trophic changes noted.   Musculoskeletal: exam reveals pain in the right knee. She is status post left total knee replacement   Neurologically:  Mental status: The patient is awake, alert and oriented in all 4  spheres. Her immediate and remote memory, attention, language skills and fund of knowledge are appropriate. There is no evidence of aphasia, agnosia, apraxia or anomia. Thought process is linear. Mood is normal and affect is normal.  Cranial nerves II - XII are as described above under HEENT exam.  Motor exam: Normal bulk, strength and tone is noted, reports mild/subjective weakness in the left lower extremity. Fine motor skills and coordination: grossly intact.  Cerebellar testing: No dysmetria or intention tremor. There is no truncal or gait ataxia.  Sensory exam: intact to light touch in the upper and lower extremities.  Gait, station and balance: She stands with mild difficulty and pushes herself up.  She has a rolling walker and has a mild limp on the right, all stable.   Assessment and Plan:  In summary, [name] [name] is a very pleasant 80 year old female with an underlying medical history of stroke, irritable bowel syndrome, hypertension, hyperlipidemia, reflux disease, depression, arthritis, and obesity, who presents for follow-up consultation of her obstructive sleep apnea.  She was diagnosed several years ago and has been using an AutoPap machine.  She had an interim home sleep test on 04/02/2020 which confirmed her sleep apnea diagnosis, overall AHI was 50.6/h, O2 nadir 86%.  I did write for a new machine but she elected to continue with her current AutoPap machine but her pressure was readjusted to a lower range.  She is using a nasal cushion interface with good success and better tolerance compared to  a full facemask before.  She is commended on her full treatment adherence.  We talked about inspire and other treatment options for sleep apnea including a dental device.  Of note, she also uses a bite guard nightly for bruxism.  She is in favor of continuing with AutoPap therapy.  She has an appointment pending with cardiology and a follow-up in stroke clinic next month.  She would like to see  Dr. Leonie Man as well if possible.  She is encouraged to talk to Janett Billow about this for her next follow-up appointment.  From the sleep apnea standpoint, she is doing well.  She does have a residual AHI of around 8.7/h, primarily because of central events, I would not like to make any changes to her pressure settings at this time.  She is agreeable to maintaining treatment and we can certainly talk about replacing her machine at the next appointment.  She is advised to follow-up with Janett Billow routinely in 1 year for this. I answered all her questions today and she was in agreement. I spent 30 minutes in total face-to-face time and in reviewing records during pre-charting, more than 50% of which was spent in counseling and coordination of care, reviewing test results, reviewing medications and treatment regimen and/or in discussing or reviewing the diagnosis of OSA, the prognosis and treatment options. Pertinent laboratory and imaging test results that were available during this visit with the patient were reviewed by me and considered in my medical decision making (see chart for details).

## 2020-06-20 ENCOUNTER — Ambulatory Visit: Payer: Medicare HMO | Admitting: Internal Medicine

## 2020-06-20 ENCOUNTER — Other Ambulatory Visit: Payer: Self-pay

## 2020-06-20 ENCOUNTER — Encounter: Payer: Self-pay | Admitting: Internal Medicine

## 2020-06-20 VITALS — BP 138/76 | HR 64 | Ht 60.0 in | Wt 155.8 lb

## 2020-06-20 DIAGNOSIS — I639 Cerebral infarction, unspecified: Secondary | ICD-10-CM | POA: Diagnosis not present

## 2020-06-20 HISTORY — PX: OTHER SURGICAL HISTORY: SHX169

## 2020-06-20 NOTE — Patient Instructions (Addendum)
Medication Instructions:  Your physician recommends that you continue on your current medications as directed. Please refer to the Current Medication list given to you today.  Labwork: None ordered.  Testing/Procedures: None ordered.  Your physician wants you to follow-up in: as needed with Dr. Allred.  You will receive a reminder letter in the mail two months in advance. If you don't receive a letter, please call our office to schedule the follow-up appointment.    Implantable Loop Recorder Placement, Care After This sheet gives you information about how to care for yourself after your procedure. Your health care provider may also give you more specific instructions. If you have problems or questions, contact your health care provider. What can I expect after the procedure? After the procedure, it is common to have:  Soreness or discomfort near the incision.  Some swelling or bruising near the incision.  Follow these instructions at home: Incision care  1.  Leave your outer dressing on for 24 hours.  After 24 hours you can remove your outer dressing and shower. 2. Leave adhesive strips in place. These skin closures may need to stay in place for 1-2 weeks. If adhesive strip edges start to loosen and curl up, you may trim the loose edges.  You may remove the strips if they have not fallen off after 2 weeks. 3. Check your incision area every day for signs of infection. Check for: a. Redness, swelling, or pain. b. Fluid or blood. c. Warmth. d. Pus or a bad smell. 4. Do not take baths, swim, or use a hot tub until your incision is completely healed. 5. If your wound site starts to bleed apply pressure.      If you have any questions/concerns please call the device clinic at [phone].  Activity  Return to your normal activities.  General instructions  Follow instructions from your health care provider about how to manage your implantable loop recorder and transmit the  information. Learn how to activate a recording if this is necessary for your type of device.  Do not go through a metal detection gate, and do not let someone hold a metal detector over your chest. Show your ID card.  Do not have an MRI unless you check with your health care provider first.  Take over-the-counter and prescription medicines only as told by your health care provider.  Keep all follow-up visits as told by your health care provider. This is important. Contact a health care provider if:  You have redness, swelling, or pain around your incision.  You have a fever.  You have pain that is not relieved by your pain medicine.  You have triggered your device because of fainting (syncope) or because of a heartbeat that feels like it is racing, slow, fluttering, or skipping (palpitations). Get help right away if you have:  Chest pain.  Difficulty breathing. Summary  After the procedure, it is common to have soreness or discomfort near the incision.  Change your dressing as told by your health care provider.  Follow instructions from your health care provider about how to manage your implantable loop recorder and transmit the information.  Keep all follow-up visits as told by your health care provider. This is important. This information is not intended to replace advice given to you by your health care provider. Make sure you discuss any questions you have with your health care provider. Document Released: 04/23/2015 Document Revised: 06/27/2017 Document Reviewed: 06/27/2017 Elsevier Patient Education  2020 [employer]     [employer]

## 2020-06-20 NOTE — Progress Notes (Signed)
PCP: Tonia Ghent, MD   Primary EP: Dr Rayann Heman  [name] [name] is a 80 y.o. female who presents today for routine electrophysiology followup.  Since last being seen in our clinic, the patient reports doing very well.  Today, she denies symptoms of palpitations, chest pain, shortness of breath,  lower extremity edema, dizziness, presyncope, or syncope.  The patient is otherwise without complaint today.   Past Medical History:  Diagnosis Date  . Arthritis    knees  . Depression   . Fatty liver   . GERD (gastroesophageal reflux disease)   . Hyperlipidemia   . Hypertension   . IBS (irritable bowel syndrome)   . Obesity   . Pneumonia   . Sleep apnea    uses CPAP  . Stroke (cerebrum) [facility])    Past Surgical History:  Procedure Laterality Date  . BLADDER SURGERY    . BREAST SURGERY     breast biopsy  . BROW LIFT Bilateral 04/14/2017   Procedure: BLEPHAROPLASTY UPPER EYELID WITH EXCESS SKIN;  Surgeon: Karle Starch, MD;  Location: [place];  Service: Ophthalmology;  Laterality: Bilateral;  . CATARACT EXTRACTION W/PHACO Left 02/05/2016   Procedure: CATARACT EXTRACTION PHACO AND INTRAOCULAR LENS PLACEMENT (IOC);  Surgeon: Birder Robson, MD;  Location: [facility];  Service: Ophthalmology;  Laterality: Left;  [place] 01:10 AP% 22.3 CDE 15.67 Fluid pack lot # [id number]  . CATARACT EXTRACTION W/PHACO Right 02/26/2016   Procedure: CATARACT EXTRACTION PHACO AND INTRAOCULAR LENS PLACEMENT (IOC);  Surgeon: Birder Robson, MD;  Location: [facility];  Service: Ophthalmology;  Laterality: Right;  [place] 57.4 AP% 24.0 CDE 13.75 Fluid Pack lot # [id number]  . CHOLECYSTECTOMY    . COLONOSCOPY WITH PROPOFOL N/A 12/18/2014   Procedure: COLONOSCOPY WITH PROPOFOL;  Surgeon: Manya Silvas, MD;  Location: [facility];  Service: Endoscopy;  Laterality: N/A;  . DEEP NECK LYMPH NODE BIOPSY / EXCISION    . JOINT REPLACEMENT    . KNEE ARTHROPLASTY Left 06/20/2015   Procedure: COMPUTER ASSISTED  TOTAL KNEE ARTHROPLASTY;  Surgeon: Dereck Leep, MD;  Location: [facility];  Service: Orthopedics;  Laterality: Left;  . PTOSIS REPAIR Bilateral 04/14/2017   Procedure: PTOSIS REPAIR RESECT EX;  Surgeon: Karle Starch, MD;  Location: [place];  Service: Ophthalmology;  Laterality: Bilateral;  sleep apnea  . TONSILLECTOMY    . TOTAL HIP ARTHROPLASTY Right 07/21/2018   Procedure: TOTAL HIP ARTHROPLASTY ANTERIOR APPROACH;  Surgeon: Gaynelle Arabian, MD;  Location: [facility];  Service: Orthopedics;  Laterality: Right;    ROS- all systems are reviewed and negatives except as per HPI above  Current Outpatient Medications  Medication Sig Dispense Refill  . acetaminophen (TYLENOL) 500 MG tablet Take 500 mg by mouth every 6 (six) hours as needed for moderate pain.     [name] Kitchen aspirin EC 81 MG tablet Take 81 mg by mouth daily. Swallow whole.    [name] Kitchen atenolol (TENORMIN) 25 MG tablet Take 1 tablet (25 mg total) by mouth 2 (two) times daily.    [name] Kitchen atorvastatin (LIPITOR) 80 MG tablet TAKE ONE TABLET BY MOUTH EVERY DAY AT 6PM 30 tablet 5  . Biotin 10000 MCG TABS Take 5,000 mcg by mouth daily.     [name] Kitchen BRILINTA 90 MG TABS tablet TAKE ONE TABLET TWICE DAILY 60 tablet 2  . cholecalciferol (VITAMIN D) 1000 UNITS tablet Take 1,000 Units by mouth daily.     [name] Kitchen escitalopram (LEXAPRO) 5 MG tablet Take 1 tablet (5 mg  total) by mouth daily. 90 tablet 1  . hydrOXYzine (ATARAX/VISTARIL) 10 MG tablet TAKE 1/2-1 TABLET BY MOUTH 3 TIMES DAILYAS NEEDED FOR ANXIETY (SEDATION CAUTION) 30 tablet 2  . ipratropium (ATROVENT) 0.06 % nasal spray one spray per nostril twice daily (up to three times daily) to help with runny nose 15 mL 5  . magnesium oxide (MAG-OX) 400 MG tablet Take 500 mg by mouth every other day.     . montelukast (SINGULAIR) 10 MG tablet Take 1 tablet (10 mg total) by mouth at bedtime. 30 tablet 5  . polyethylene glycol powder (GLYCOLAX/MIRALAX) 17 GM/SCOOP powder Take 17 g by mouth daily as needed for mild  constipation.     No current facility-administered medications for this visit.    Physical Exam: Vitals:   06/20/20 1029  BP: 138/76  Pulse: 64  SpO2: 94%  Weight: 155 lb 12.8 oz (70.7 kg)  Height: 5' (1.524 m)    GEN- The patient is well appearing, alert and oriented x 3 today.   Head- normocephalic, atraumatic Eyes-  Sclera clear, conjunctiva pink Ears- hearing intact Oropharynx- clear Lungs- Clear to ausculation bilaterally, normal work of breathing Heart- Regular rate and rhythm, no murmurs, rubs or gallops, PMI not laterally displaced GI- soft, NT, ND, + BS Extremities- no clubbing, cyanosis, or edema  Wt Readings from Last 3 Encounters:  06/20/20 155 lb 12.8 oz (70.7 kg)  06/14/20 156 lb (70.8 kg)  05/15/20 158 lb 3.2 oz (71.8 kg)    EKG tracing ordered today is personally reviewed and shows sinus  Assessment and Plan:  1. Cryptogenic stroke She has been monitoring with a 30 day monitor without afib detected. I agree with the neurology team's concern for a cardioembolic source for stroke. I would therefore advise implantation of an implantable loop recorder for long term arrhythmia monitoring.  Risks and benefits to ILR were discussed at length with the patient today, including but not limited to risks of bleeding and infection.  Extensive device education was performed.  Remote monitoring was also discussed at length today.  The patient understands and wishes to proceed.  We will proceed at this time with ILR implantation.   Thompson Grayer MD, [facility] 06/20/2020 10:55 AM      DESCRIPTION OF PROCEDURE:  Informed written consent was obtained.  The patient required no sedation for the procedure today.  The patients left chest was prepped and draped. Mapping over the patient's chest was performed to identify the appropriate ILR site.  This area was found to be the left parasternal region over the 3rd-4th intercostal space.  The skin overlying this region was infiltrated  with lidocaine for local analgesia.  A 0.5-cm incision was made at the implant site.  A subcutaneous ILR pocket was fashioned using a combination of sharp and blunt dissection.  A Medtronic Reveal Linq model LNQ 22 (SN [id number]) implantable loop recorder was then placed into the pocket R waves were very prominent and measured > 0.2 mV. EBL<1 ml.  Steri- Strips and a sterile dressing were then applied.  There were no early apparent complications.     CONCLUSIONS:   1. Successful implantation of a Medtronic Reveal LINQ implantable loop recorder for cryptogenic stroke  2. No early apparent complications.   Thompson Grayer MD, [facility] 06/20/2020 10:55 AM

## 2020-07-04 ENCOUNTER — Other Ambulatory Visit: Payer: Self-pay

## 2020-07-04 ENCOUNTER — Ambulatory Visit: Payer: Medicare HMO | Admitting: Adult Health

## 2020-07-04 ENCOUNTER — Encounter: Payer: Self-pay | Admitting: Adult Health

## 2020-07-04 VITALS — BP 121/74 | HR 64 | Ht 60.0 in | Wt 156.0 lb

## 2020-07-04 DIAGNOSIS — Z8673 Personal history of transient ischemic attack (TIA), and cerebral infarction without residual deficits: Secondary | ICD-10-CM | POA: Diagnosis not present

## 2020-07-04 DIAGNOSIS — E785 Hyperlipidemia, unspecified: Secondary | ICD-10-CM

## 2020-07-04 DIAGNOSIS — I10 Essential (primary) hypertension: Secondary | ICD-10-CM | POA: Diagnosis not present

## 2020-07-04 DIAGNOSIS — G4733 Obstructive sleep apnea (adult) (pediatric): Secondary | ICD-10-CM

## 2020-07-04 NOTE — Progress Notes (Signed)
I agree with the above plan 

## 2020-07-04 NOTE — Progress Notes (Signed)
[facility] [address] [place]. [place] [address] [phone]       STROKE FOLLOW UP NOTE  [name] [name] Date of Birth:  11/02/1940 Medical Record Number:  [id number]   Reason for Referral: stroke follow up    SUBJECTIVE:   CHIEF COMPLAINT:  Chief Complaint  Patient presents with  . Follow-up    TR with daughter (suzanne) Pt is well, sleeps good     HPI:   Today, 07/04/2020, [name] [name] returns for scheduled follow-up regarding prior strokes accompanied by her daughter. She has been stable since prior visit without any additional stroke/TIA symptoms. Reports residual left hand weakness and gait impairment. She will use RW at times especially upon awakening but otherwise doesn't need AD. She denies any recent falls.  She has remained on Brilinta, aspirin and atorvastatin 80 mg daily without side effects.  51-month aspirin and Brilinta duration will be completed at the end of this month.  Blood pressure today 121/74.  Monitors at home and typically stable.  Reports ongoing compliance with CPAP followed by Dr. Rexene Alberts. Due to concern of multiple strokes etiology cryptogenic, loop recorder placed by Dr. Rayann Heman on 06/20/2020 for further long-term monitoring of atrial fibrillation.  No further concerns at this time.   History provided for reference purposes only Update 05/14/2020 JM: [name] [name] returns per request to follow-up on recent hospitalizations.  She is accompanied by her daughter.  Personally reviewed recent hospitalizations with summary provided below.  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  P2 Y 12 4 showing inadequate Plavix function therefore switched to aspirin 81 mg daily and Brilinta 90 mg twice daily for 3 months and then continue on Brilinta alone.  Initially recommended discharge  to CIR but due to functional improvement she was discharged home on 04/23/2020 with home health therapies.  She then presented to [facility] ED on 05/08/2020 for 1 wk hx of intermittent LUE and LLE weakness, numbness/tingling and right-sided headache but due to wait time she left prior to full evaluation and went to [facility] ED with MRI reporting acute to early subacute infarct in the left centrum semiovale which was not consistent with presenting symptoms.  Per further review with neurologist, it was felt as though reported new infarct seen on prior MRIs therefore no evidence of new stroke.  Evaluated by neurology with note personally reviewed and felt possible cause of symptoms in setting of hypoperfusion with known intracranial arthrosclerosis and atenolol dosage decreased.  She then returned to [facility] ED yesterday, 05/13/2020, for left lower extremity heaviness, palpitations and headache.  Found to be hypertensive with BP 196/105.  No neurological deficits noted.  Symptoms resolved without intervention.  Mention of increased anxiety possibly contributing to symptoms.  CT head negative.  She was discharged back home in stable condition.  She reports continued intermittent b/l hand weakness where she will occasionally drop items and continued gait impairment currently working with [facility] PT. currently using rolling walker for ambulation.  Denies new or worsening stroke/TIA symptoms.  Remains on aspirin 81 mg daily and Brilinta without bleeding or bruising.  Remains on atorvastatin 80 mg daily without myalgias.  Blood pressure today 128/75.  She does routinely monitor at home but she questions accuracy as her results are consistently 150s.  She does report nightly use of CPAP for OSA management.  Completed approximately 14 days of  cardiac monitor which was negative for atrial fibrillation.  She questions need of ILR.  She does endorse increased anxiety regarding multiple strokes and WHEN (not if) another one will occur.  No further  concerns at this time.  Update 03/01/2020 JM: [name] [name] returns for sooner scheduled visit per PCP request due to recent stroke accompanied by her daughter.  She presented to [facility] regional emergency room on 02/16/2020 with right-sided weakness contributing to fall.  MRI showed left frontal lobe infarcts likely from stenosis and left M2 all within the same vascular territory and was not felt to be embolic.  MRA head showed progression of stenosis within the distal A2/proximal artery which is now severe with additional intracranial atherosclerotic stenosis without interval change including severe focal stenosis within the cavernous left ICA, moderate focal stenosis within the cavernous right ICA, moderate stenosis with A2 right ACA and moderate stenosis within the proximal P2 left PCA.  2D echo showed EF of 60 to 65%.  On aspirin PTA and recommended DAPT " but long-term she may need Effient instead of Plavix or Brilinta".  Recommended to check Plavix assays outpatient to ensure adequate response.  HTN stable.  LDL 54 recommended continuation of atorvastatin 80 mg daily.  She did have right knee pain secondary to fall and recommended follow-up with orthopedics outpatient.  Evaluated by therapy initially recommended [facility] PT/OT but improved prior to discharge with [facility] therapy no longer indicated.  She was discharged home in stable condition and advised to follow-up with PCP, neurology and orthopedics.    She has been stable since returning home without new or reoccurring stroke/TIA symptoms.  Denies residual right-sided symptoms but does continue to have chronic right knee pain and ankle pain post fall which has been limiting her ambulation.  She is currently working with [facility] PT and ambulating with rolling walker due to right leg pain.  Continues to have mild dysarthria and decreased LUE dexterity which has been stable after recent stroke and improvement since prior visit.  She has continued on aspirin and Plavix  without bleeding or bruising.  Remains on atorvastatin 80 mg daily without myalgias.  Blood pressure today satisfactory at 112/69.  She reports intermittent use of CPAP for sleep apnea management with difficulty tolerating due to increased pressure.  Reports undergoing sleep study over 10 years ago and has not had any repeat study or follow-up to ensure adequate management of apnea.  Multiple questions regarding recent stroke and etiology as well as recommended Plavix lab work Nurse, children's) as recommended by [employer] neurologist.  Further reviewed recent admission and imaging with Dr. Leonie Man due to concern of stroke locations and possible embolic etiology. Per Dr. Leonie Man, he does not believe recent stroke was due to large vessel disease but more so embolic secondary to unknown source as her recent imaging showed infarcts within different vascular locations including ACA and MCA.  Also reviewed imaging from prior stroke in 06/2019 and due to size, potentially embolic. He recommends further cardiac evaluation with placement of loop recorder to assess for possible atrial fibrillation as stroke etiology.  No indication for TEE. He recommends aspirin and Plavix for only 3-week duration and then Plavix alone.  No indication for P2 Y 12 testing as she has no history or evidence of Plavix failure or concern.  Update 01/02/2020 JM: Ms. Talton is being seen for stroke follow-up previously followed through [facility] research participating in [place] stroke trial. Evaluated during stroke trial on 10/10/2019 recently completing [place] therapies with  residual deficits of mild decrease left hand dexterity, imbalance and dysarthria therefore additional orders placed for outpatient therapies.  She does report ongoing improvement and continues to work with neuro rehab PT/OT/ST for residual dysarthria and dysphonia, gait impairment/imbalance and LUE incoordination. Evaluated in [place] by Dr. 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  Reports continued participation SLP with [facility] Reports imbalance greatest difficulty still with improvement but also underlying chronic lower back, bilateral hip and knee pain.  She wishes to proceed with additional injections and nerve blocks therefore withdrew from research study approximately 1 month ago.  Continues to follow with [facility] She remains on aspirin 81mg  daily without bleeding or bruising. Continues on atorvastatin 80 mg daily without myalgias.  Recent lipid panel 10/13/2019 showed LDL 64.  Blood pressure today 116/70.  No further concerns.  Stroke admission 07/11/2019 Personally reviewed recent hospitalization pertinent notes, labs and imaging Ms. GEARLINE SPILMAN is a 80 y.o. female with history of HTN, HLD, obesity who presented on 07/11/2019 with dysarthria, L facial droop, mild L hemiparesis as well as hypertensive urgency.  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  Recommended DAPT for 3 weeks and aspirin alone.  BP upon arrival 202/110 stabilized during admission and recommended long-term BP goal normotensive range.  LDL 130 and initiate atorvastatin 80 mg daily. Other stroke risk factors include advanced age, EtOH use, OSA on CPAP and family reported hx imaging with evidence of small AAA.  Evaluated by therapy and recommend discharge to CIR for ongoing therapy needs but unfortunately did not by insurance therefore discharged home with home health therapy.  Also advised to follow-up with [facility] research is interested in [employer] stroke trial.  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   MRI  R corona radiata and caudate head small vessel disease. infarcts  CTA head & neck negative LVO, negative arthrosclerosis or stenosis in neck, positive for intracranial arthrosclerosis with significant stenosis within moderate to  severe left ICA anterior genu, moderate bilateral ICA distally to and moderate left PCA P1  2D Echo  EF 50 to 55%  LDL 130  HgbA1c 5.5  Lovenox 40 mg sq daily for VTE prophylaxis  No antithrombotic prior to admission, now on clopidogrel 75 mg daily and aspirin 324 mg. Change aspirin to 81. Continue DAPT x 3 weeks then aspirin alone.   Therapy recommendations: CLR -insurance denied therefore recommended HH therapy  Disposition: Home  Recommend aspirin and Plavix for 3 weeks followed by aspirin alone. She is  participating in the [employer] stroke prevention trial( standard of care antiplatelets and Factor xi inhibitor versus standard of care antiplates and placebo)         ROS:   14 system review of systems performed and negative with exception of see HPI  PMH:  Past Medical History:  Diagnosis Date  . Arthritis    knees  . Depression   . Fatty liver   . GERD (gastroesophageal reflux disease)   . Hyperlipidemia   . Hypertension   . IBS (irritable bowel syndrome)   . Obesity   . Pneumonia   . Sleep apnea    uses CPAP  . Stroke (cerebrum) (HCC)     PSH:  Past Surgical History:  Procedure Laterality Date  . BLADDER SURGERY    . BREAST SURGERY  breast biopsy  . BROW LIFT Bilateral 04/14/2017   Procedure: BLEPHAROPLASTY UPPER EYELID WITH EXCESS SKIN;  Surgeon: Karle Starch, MD;  Location: [place];  Service: Ophthalmology;  Laterality: Bilateral;  . CATARACT EXTRACTION W/PHACO Left 02/05/2016   Procedure: CATARACT EXTRACTION PHACO AND INTRAOCULAR LENS PLACEMENT (IOC);  Surgeon: Birder Robson, MD;  Location: [facility];  Service: Ophthalmology;  Laterality: Left;  [place] 01:10 AP% 22.3 CDE 15.67 Fluid pack lot # [id number]  . CATARACT EXTRACTION W/PHACO Right 02/26/2016   Procedure: CATARACT EXTRACTION PHACO AND INTRAOCULAR LENS PLACEMENT (IOC);  Surgeon: Birder Robson, MD;  Location: [facility];  Service: Ophthalmology;  Laterality: Right;  [place] 57.4 AP%  24.0 CDE 13.75 Fluid Pack lot # [id number]  . CHOLECYSTECTOMY    . COLONOSCOPY WITH PROPOFOL N/A 12/18/2014   Procedure: COLONOSCOPY WITH PROPOFOL;  Surgeon: Manya Silvas, MD;  Location: [facility] ENDOSCOPY;  Service: Endoscopy;  Laterality: N/A;  . DEEP NECK LYMPH NODE BIOPSY / EXCISION    . implantable loop recorder implantation  06/20/2020   Medtronic Reveal Linq model LNQ 22 ([place] [id number]) implantable loop recorder for cryptogenic stroke  . JOINT REPLACEMENT    . KNEE ARTHROPLASTY Left 06/20/2015   Procedure: COMPUTER ASSISTED TOTAL KNEE ARTHROPLASTY;  Surgeon: Dereck Leep, MD;  Location: [facility];  Service: Orthopedics;  Laterality: Left;  . PTOSIS REPAIR Bilateral 04/14/2017   Procedure: PTOSIS REPAIR RESECT EX;  Surgeon: Karle Starch, MD;  Location: [place];  Service: Ophthalmology;  Laterality: Bilateral;  sleep apnea  . TONSILLECTOMY    . TOTAL HIP ARTHROPLASTY Right 07/21/2018   Procedure: TOTAL HIP ARTHROPLASTY ANTERIOR APPROACH;  Surgeon: Gaynelle Arabian, MD;  Location: [facility];  Service: Orthopedics;  Laterality: Right;    Social History:  Social History   Socioeconomic History  . Marital status: Widowed    Spouse name: Not on file  . Number of children: Not on file  . Years of education: Not on file  . Highest education level: Not on file  Occupational History  . Occupation: retired  Tobacco Use  . Smoking status: Never Smoker  . Smokeless tobacco: Never Used  Vaping Use  . Vaping Use: Never used  Substance and Sexual Activity  . Alcohol use: Yes    Comment: ocassional glass of wine 2-3 times a year  . Drug use: No  . Sexual activity: Never  Other Topics Concern  . 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 Heart attack Mother   . Dementia Father   . Heart attack Father 56  . Aortic aneurysm Brother   . Stroke Paternal Grandfather   . Colon cancer Neg Hx   . Breast cancer Neg Hx     Medications:   Current Outpatient Medications on File Prior to Visit  Medication Sig Dispense Refill  . acetaminophen (TYLENOL) 500 MG tablet Take 500 mg by mouth every 6 (six) hours as needed  for moderate pain.     [name] Kitchen aspirin EC 81 MG tablet Take 81 mg by mouth daily. Swallow whole.    [name] Kitchen atenolol (TENORMIN) 25 MG tablet Take 1 tablet (25 mg total) by mouth 2 (two) times daily.    [name] Kitchen atorvastatin (LIPITOR) 80 MG tablet TAKE ONE TABLET BY MOUTH EVERY DAY AT 6PM 30 tablet 5  . Biotin 10000 MCG TABS Take 5,000 mcg by mouth daily.     [name] Kitchen BRILINTA 90 MG TABS tablet TAKE ONE TABLET TWICE DAILY 60 tablet 2  . cholecalciferol (VITAMIN D) 1000 UNITS tablet Take 1,000 Units by mouth daily.     [name] Kitchen escitalopram (LEXAPRO) 5 MG tablet Take 1 tablet (5 mg total) by mouth daily. 90 tablet 1  . hydrOXYzine (ATARAX/VISTARIL) 10 MG tablet TAKE 1/2-1 TABLET BY MOUTH 3 TIMES DAILYAS NEEDED FOR ANXIETY (SEDATION CAUTION) 30 tablet 2  . ipratropium (ATROVENT) 0.06 % nasal spray one spray per nostril twice daily (up to three times daily) to help with runny nose 15 mL 5  . magnesium oxide (MAG-OX) 400 MG tablet Take 500 mg by mouth daily.    . montelukast (SINGULAIR) 10 MG tablet Take 1 tablet (10 mg total) by mouth at bedtime. 30 tablet 5  . polyethylene glycol powder (GLYCOLAX/MIRALAX) 17 GM/SCOOP powder Take 17 g by mouth daily as needed for mild constipation.     No current facility-administered medications on file prior to visit.    Allergies:   Allergies  Allergen Reactions  . Hydrocodone Nausea  Only    Noted after surgery, may be able to tolerate with food      OBJECTIVE:  Physical Exam  Vitals:   07/04/20 0839  BP: 121/74  Pulse: 64  Weight: 156 lb (70.8 kg)  Height: 5' (1.524 m)   Body mass index is 30.47 kg/m. No exam data present  General: well developed, well nourished, pleasant elderly Caucasian female, seated, in no evident distress Neck: supple with no carotid or supraclavicular bruits Cardiovascular: regular rate and rhythm, no murmurs Vascular:  Normal pulses all extremities  Neurologic Exam Mental Status: Awake and fully alert.  Fluent speech and language.  Oriented to place and time. Recent and remote memory intact. Attention span, concentration and fund of knowledge appropriate. Mood and affect appropriate.  Cranial Nerves: Pupils equal, briskly reactive to light. Extraocular movements full without nystagmus. Visual fields full to confrontation. Hearing intact. Facial sensation intact.  Tongue and palate moves normally and symmetrically.  Mild left lower facial weakness Motor: Normal bulk and tone. Normal strength in all tested extremity muscles except slight decrease left hand dexterity Sensory.: intact to touch , pinprick , position and vibratory sensation.  Coordination: Rapid alternating movements normal in all extremities except decreased left hand dexterity. Finger-to-nose and heel-to-shin performed accurately bilaterally. Orbits right arm over left arm. Gait and Station: Stands from seated position without difficulty.  Stance is normal.  Gait demonstrates relatively normal stride length with slight unsteadiness especially with turns but ambulating without assistive device.  Romberg negative. Reflexes: 1+ and symmetric. Toes downgoing.       ASSESSMENT/PLAN: [name] [name] is a 80 y.o. year old female with history of R MCA stroke felt secondary to small vessel disease on 07/11/2019, left frontal lobe infarct 02/16/2020 evaluated at [facility] regional  felt secondary to large vessel disease vs embolic source, left ACA stroke 03/2020 likely secondary to progressive intracranial arthrosclerosis.  P2Y12 4 on Plavix.  2 additional ED evaluations 05/09/2020 and 05/13/2020  for worsening left-sided weakness and numbness without evidence of acute infarct contributing to symptoms and felt likely in setting of hypoperfusion with intracranial arthrosclerosis. Vascular risk factors include HTN, HLD, intracranial stenosis with stenosis, OSA on CPAP, EtOH use and obesity.      1. Multiple reoccurring strokes:  a. Residual decreased left hand dexterity and gait impairment but overall continued gradual improvement b. Loop recorder placed 06/20/2020 by Dr. Rayann Heman to further monitoring for atrial fibrillation as potential stroke etiology. c. Continue Brilinta and atorvastatin 80 mg daily for secondary stroke prevention -continue aspirin in addition with Brilinta until the end of this month and then continue Brilinta alone for completion of recommended 40-month DAPT d. Discussed secondary stroke prevention measures and importance of close PCP follow-up for aggressive stroke risk factor management 2. Intracranial arthrosclerosis: Currently asymptomatic. Continue Brilinta and statin as well as managing stroke risk factors. Also discussed avoiding hypotension to ensure adequate perfusion. No indication for surveillance monitoring unless she becomes symptomatic as her current treatment plan would not change 3. HTN:  a. BP goal 130-150 to ensure adequate perfusion.  Encouraged continued routine monitoring at home and follow-up with PCP for ongoing management 4. HLD: a.  LDL goal <70. Prior LDL 48 (03/2020) on atorvastatin 80 mg daily per PCP 5. OSA on CPAP:  a. Continued nightly use of CPAP followed by Dr. Rexene Alberts    Follow-up in 4 months with Dr. Leonie Man per Dr. Guadelupe Sabin recommendation or call sooner if needed   CC:  [place] provider: Dr. Oliver Hum, Elveria Rising, MD    I  spent 30 minutes of face-to-face and non-face-to-face time with patient and daughter.  This included previsit chart review, lab review, study review, order entry, electronic health record documentation, patient and daughter discussion/education regarding recurrent strokes and placement of loop recorder, residual deficits, importance of managing stroke risk factors and answered all other questions to patient and daughters satisfaction   Frann Rider, [facility]  [facility] [address] [place], [place] [address]  Phone [phone] Fax [phone] Note: This document was prepared with digital dictation and possible smart phrase technology. Any transcriptional errors that result from this process are unintentional.

## 2020-07-04 NOTE — Patient Instructions (Signed)
Continue aspirin 81 mg daily and Brilinta (ticagrelor) 90 mg bid  and atorvastatin  for secondary stroke prevention  Discontinue aspirin at the end of this month and continue on Brlinta alone  Your loop recorder will continue to be monitored for possible atrial fibrillation  Continue to follow up with PCP regarding cholesterol and blood pressure management  Maintain strict control of hypertension with blood pressure goal below 130/90 and cholesterol with LDL cholesterol (bad cholesterol) goal below 70 mg/dL.       Followup in the future with Dr. Leonie Man in 4 months or call earlier if needed       Thank you for coming to see [name] [name] at [facility]. I hope we have been able to provide you high quality care today.  You may receive a patient satisfaction survey over the next few weeks. We would appreciate your feedback and comments so that we may continue to improve ourselves and the health of our patients.

## 2020-07-10 ENCOUNTER — Telehealth: Payer: Self-pay | Admitting: *Deleted

## 2020-07-10 MED ORDER — PROMETHAZINE-DM 6.25-15 MG/5ML PO SYRP: 2.5000 mL | ORAL_SOLUTION | Freq: Four times a day (QID) | ORAL | 0 refills | Status: DC | PRN

## 2020-07-10 NOTE — Telephone Encounter (Signed)
Patient called stating that she has a cough that started 3-4 days ago. Patient stated that the cough is productive and little green in color. Patient stated that she has head congestion. Patient stated that she has some hoarseness. Patient denies, fever, body aches, SOB or difficulty breathing. Patient stated that she does not feel that she needs an antibiotic. Patient stated that she did a home covid test on Saturday and today and both were negative. Patient stated that she was given some Promethazine cough medication a couple of years ago and had that on hand but it has expired. Patient wants to know if Dr. Damita Dunnings will send her a script in for the cough medication. Patient was given ER precautions and she verbalized understanding. Patient was advised to rest, drink lots of fluid and eat well balanced meals.  [facility]

## 2020-07-10 NOTE — Telephone Encounter (Signed)
Sent. Thanks.  Please update [place] as needed.

## 2020-07-11 NOTE — Telephone Encounter (Signed)
Please sign and close encounter when completed.

## 2020-07-11 NOTE — Telephone Encounter (Signed)
Patient aware rx was sent in. Advised patient if does not help or gets worse to let us know.

## 2020-07-18 ENCOUNTER — Other Ambulatory Visit: Payer: Self-pay | Admitting: Family Medicine

## 2020-07-18 NOTE — Telephone Encounter (Signed)
Pharmacy requests refill on: Brilinta 90 mg    LAST REFILL: 05/21/2020 (Q-60, R-2)  LAST OV: 05/15/2020 NEXT OV: Not Scheduled  PHARMACY: [facility]

## 2020-07-19 ENCOUNTER — Encounter: Payer: Self-pay | Admitting: Family Medicine

## 2020-07-23 ENCOUNTER — Other Ambulatory Visit: Payer: Self-pay | Admitting: Family Medicine

## 2020-07-23 NOTE — Telephone Encounter (Signed)
Refill request Promethazine-dextromethorphan Last office visit 05/15/20 Last refill 07/10/20  120 ML

## 2020-07-24 NOTE — Telephone Encounter (Signed)
Sent. Thanks.   

## 2020-07-25 ENCOUNTER — Telehealth: Payer: Self-pay | Admitting: Adult Health

## 2020-07-25 ENCOUNTER — Ambulatory Visit (INDEPENDENT_AMBULATORY_CARE_PROVIDER_SITE_OTHER): Payer: Medicare HMO

## 2020-07-25 DIAGNOSIS — I639 Cerebral infarction, unspecified: Secondary | ICD-10-CM

## 2020-07-25 NOTE — Telephone Encounter (Signed)
Pt called wanting to discuss her bulging disk and possible injections that she may have to be cleared for. Please advise.

## 2020-07-25 NOTE — Telephone Encounter (Signed)
Returned call.  Patient states that she was getting injections for her bulging disks prior to the stroke.    She is having trouble completing PT due to back pain and taking ibuprofen/acetaminophen. Discussed with her alternating ibuprofen and acetaminophen.  Also to not take more than 4 grams of tylenol in a 24 hour period.   Patient stated she is calling [facility] to initiate injections and send clearance to [place].  Patient denied further questions, verbalized understanding and expressed appreciation for the phone call.

## 2020-07-26 LAB — CUP PACEART REMOTE DEVICE CHECK
Date Time Interrogation Session: 20220301094636
Implantable Pulse Generator Implant Date: 20220126

## 2020-07-27 DIAGNOSIS — G4733 Obstructive sleep apnea (adult) (pediatric): Secondary | ICD-10-CM | POA: Diagnosis not present

## 2020-07-31 ENCOUNTER — Telehealth: Payer: Self-pay | Admitting: *Deleted

## 2020-07-31 MED ORDER — ESCITALOPRAM OXALATE 10 MG PO TABS: 10.0000 mg | ORAL_TABLET | Freq: Every day | ORAL | 1 refills | Status: DC

## 2020-07-31 NOTE — Telephone Encounter (Addendum)
Pt returning call.  I asked about her mood.  Says she's having trouble sleeping due to a lot on her mind.  I relayed Dr. Synthia Innocent message and pt would like to increase her med dose to see if it helps.   Also, pt is asking if any of her current medications would cause dry mouth.  Plz advise.

## 2020-07-31 NOTE — Telephone Encounter (Signed)
Pt left v/m returning missed call. Sending to [facility] CMA.

## 2020-07-31 NOTE — Telephone Encounter (Signed)
lexapro 10mg  sent to pharmacy May double up on current 5mg  dose until she runs out.  Hydroxyzine is most likely to cause dry mouth. atrovent nasal spray can as well.  lexapro could as well - let us know if dry mouth worsens on higher dose.

## 2020-07-31 NOTE — Addendum Note (Signed)
Addended by: Ria Bush on: 07/31/2020 06:06 PM   Modules accepted: Orders

## 2020-07-31 NOTE — Telephone Encounter (Signed)
Patient's daughter [name] (on [place]) left a voicemail stating that her mom was put on medication for depression and anxiety a couple of months ago. [name] stated that her mom was put on a low dose of medication and was told that it could be increased if needed. Patient's daughter stated that her mom's depression and anxiety has been bad the past couple of weeks. [name] stated that she has tried to get her mom to call and let Dr. Damita Dunnings know that but she has not. [name] wants to know if her Lexapro can be increased to a higher dose. Called and spoke to [place] and was advised that there was some improvement shortly after her mom stated the Lexapro. [name] stated about two weeks ago the medication seemed to stop working and her mom's anxiety and depression seemed to be getting bad again. [name] stated that Dr. Damita Dunnings told them that the dose could be increased if it needed to be. Patient's daughter stated that she does not feel that her mom would hurt herself or anyone else. ER precautions were given to [name] and she verbalized understanding.

## 2020-07-31 NOTE — Telephone Encounter (Signed)
Lvm asking pt to call back.  Need to relay Dr. G's message.  

## 2020-07-31 NOTE — Telephone Encounter (Signed)
Daughter is DPR. However would reach out to patient herself to touch base on mood. If patient desires, reasonable to increase dose of lexapro to 10mg  daily. Let us know if desired and I will send in higher dose.

## 2020-08-01 NOTE — Telephone Encounter (Signed)
Patient notified about new rx and advised can double up on the 5 mg until out and then start new rx. Advised of meds that can cause dry mouth and advised patient to call back if gets worse on increased dose.

## 2020-08-02 NOTE — Progress Notes (Signed)
Carelink Summary Report / Loop Recorder 

## 2020-08-04 ENCOUNTER — Emergency Department (HOSPITAL_COMMUNITY): Payer: Medicare HMO

## 2020-08-04 ENCOUNTER — Emergency Department (HOSPITAL_COMMUNITY)
Admission: EM | Admit: 2020-08-04 | Discharge: 2020-08-04 | Disposition: A | Discharge status: Home / Self Care | Payer: Medicare HMO | Attending: Emergency Medicine | Admitting: Emergency Medicine

## 2020-08-04 ENCOUNTER — Encounter (HOSPITAL_COMMUNITY): Payer: Self-pay | Admitting: Emergency Medicine

## 2020-08-04 ENCOUNTER — Other Ambulatory Visit: Payer: Self-pay

## 2020-08-04 DIAGNOSIS — M25561 Pain in right knee: Secondary | ICD-10-CM | POA: Diagnosis present

## 2020-08-04 DIAGNOSIS — Z96652 Presence of left artificial knee joint: Secondary | ICD-10-CM | POA: Diagnosis not present

## 2020-08-04 DIAGNOSIS — M25361 Other instability, right knee: Secondary | ICD-10-CM | POA: Diagnosis not present

## 2020-08-04 DIAGNOSIS — Z96641 Presence of right artificial hip joint: Secondary | ICD-10-CM | POA: Diagnosis not present

## 2020-08-04 DIAGNOSIS — Z79899 Other long term (current) drug therapy: Secondary | ICD-10-CM | POA: Insufficient documentation

## 2020-08-04 DIAGNOSIS — I10 Essential (primary) hypertension: Secondary | ICD-10-CM | POA: Diagnosis not present

## 2020-08-04 DIAGNOSIS — R531 Weakness: Secondary | ICD-10-CM | POA: Diagnosis not present

## 2020-08-04 DIAGNOSIS — R410 Disorientation, unspecified: Secondary | ICD-10-CM | POA: Insufficient documentation

## 2020-08-04 DIAGNOSIS — M1711 Unilateral primary osteoarthritis, right knee: Secondary | ICD-10-CM | POA: Diagnosis not present

## 2020-08-04 DIAGNOSIS — Z8739 Personal history of other diseases of the musculoskeletal system and connective tissue: Secondary | ICD-10-CM | POA: Insufficient documentation

## 2020-08-04 DIAGNOSIS — G459 Transient cerebral ischemic attack, unspecified: Secondary | ICD-10-CM | POA: Diagnosis not present

## 2020-08-04 DIAGNOSIS — Z7982 Long term (current) use of aspirin: Secondary | ICD-10-CM | POA: Diagnosis not present

## 2020-08-04 DIAGNOSIS — W19XXXA Unspecified fall, initial encounter: Secondary | ICD-10-CM | POA: Insufficient documentation

## 2020-08-04 LAB — URINALYSIS, ROUTINE W REFLEX MICROSCOPIC
Bilirubin Urine: NEGATIVE
Glucose, UA: NEGATIVE mg/dL
Hgb urine dipstick: NEGATIVE
Ketones, ur: 5 mg/dL — AB
Leukocytes,Ua: NEGATIVE
Nitrite: NEGATIVE
Protein, ur: NEGATIVE mg/dL
Specific Gravity, Urine: 1.009 (ref 1.005–1.030)
pH: 6 (ref 5.0–8.0)

## 2020-08-04 LAB — COMPREHENSIVE METABOLIC PANEL
ALT: 20 U/L (ref 0–44)
AST: 26 U/L (ref 15–41)
Albumin: 4.2 g/dL (ref 3.5–5.0)
Alkaline Phosphatase: 121 U/L (ref 38–126)
Anion gap: 10 (ref 5–15)
BUN: 12 mg/dL (ref 8–23)
CO2: 25 mmol/L (ref 22–32)
Calcium: 9.9 mg/dL (ref 8.9–10.3)
Chloride: 103 mmol/L (ref 98–111)
Creatinine, Ser: 0.88 mg/dL (ref 0.44–1.00)
GFR, Estimated: >60 mL/min (ref >60)
Glucose, Bld: 101 mg/dL — ABNORMAL HIGH (ref 70–99)
Potassium: 3.7 mmol/L (ref 3.5–5.1)
Sodium: 138 mmol/L (ref 135–145)
Total Bilirubin: 1 mg/dL (ref 0.3–1.2)
Total Protein: 6.9 g/dL (ref 6.5–8.1)

## 2020-08-04 LAB — CBC WITH DIFFERENTIAL/PLATELET
Abs Immature Granulocytes: 0.03 10*3/uL (ref 0.00–0.07)
Basophils Absolute: 0.1 10*3/uL (ref 0.0–0.1)
Basophils Relative: 1 %
Eosinophils Absolute: 0.1 10*3/uL (ref 0.0–0.5)
Eosinophils Relative: 2 %
HCT: 44.6 % (ref 36.0–46.0)
Hemoglobin: 14.6 g/dL (ref 12.0–15.0)
Immature Granulocytes: 0 %
Lymphocytes Relative: 21 %
Lymphs Abs: 1.5 10*3/uL (ref 0.7–4.0)
MCH: 30.8 pg (ref 26.0–34.0)
MCHC: 32.7 g/dL (ref 30.0–36.0)
MCV: 94.1 fL (ref 80.0–100.0)
Monocytes Absolute: 0.7 10*3/uL (ref 0.1–1.0)
Monocytes Relative: 10 %
Neutro Abs: 4.5 10*3/uL (ref 1.7–7.7)
Neutrophils Relative %: 66 %
Platelets: 278 10*3/uL (ref 150–400)
RBC: 4.74 MIL/uL (ref 3.87–5.11)
RDW: 13.2 % (ref 11.5–15.5)
WBC: 6.9 10*3/uL (ref 4.0–10.5)
nRBC: 0 % (ref 0.0–0.2)

## 2020-08-04 LAB — PROTIME-INR
INR: 1 (ref 0.8–1.2)
Prothrombin Time: 13.2 seconds (ref 11.4–15.2)

## 2020-08-04 NOTE — ED Notes (Signed)
D/c no concerns at this time.

## 2020-08-04 NOTE — ED Notes (Signed)
Patient transported to CT 

## 2020-08-04 NOTE — ED Provider Notes (Signed)
[facility] DEPT Provider Note   CSN: [id number] Arrival date & time: 08/04/20  1754     History Chief Complaint  Patient presents with  . Fall    [name] [name] is a 80 y.o. female.  The patient has a history of multiple strokes.  She states that she has had the same symptom happen as she did today in the presence of stroke.  However, the diagnostic picture is little bit unclear because she also has a history of severe knee arthritis on this side.  She states that currently she is feeling slightly disoriented, and she is mainly worried that she has had a new stroke.  The history is provided by the patient.  Weakness Severity:  Moderate Onset quality:  Sudden Timing:  Constant Progression:  Resolved Chronicity:  Recurrent Context comment:  History of multiple strokes, and the patient states that she was ambulating with her walker today when her right leg gave out. Relieved by:  Nothing Worsened by:  Nothing Ineffective treatments:  None tried Associated symptoms: difficulty walking, falls and stroke symptoms   Associated symptoms: no abdominal pain, no aphasia, no arthralgias, no chest pain, no cough, no dizziness, no dysuria, no numbness in extremities, no fever, no headaches, no seizures, no shortness of breath, no syncope, no vision change and no vomiting   Risk factors comment:  History of strokes.  States she is compliant with her Brilinta.      Past Medical History:  Diagnosis Date  . Arthritis    knees  . Depression   . Fatty liver   . GERD (gastroesophageal reflux disease)   . Hyperlipidemia   . Hypertension   . IBS (irritable bowel syndrome)   . Obesity   . Pneumonia   . Sleep apnea    uses CPAP  . Stroke (cerebrum) [facility])     Patient Active Problem List   Diagnosis Date Noted  . Acute CVA (cerebrovascular accident) ([place]) 04/18/2020  . Class 1 obesity due to excess calories with body mass index (BMI) of 30.0 to 30.9 in  adult 04/18/2020  . Seasonal and perennial allergic rhinitis 03/28/2020  . HLD (hyperlipidemia) 02/16/2020  . Stroke ([place]) 02/16/2020  . Fall 02/16/2020  . Abdominal wall hernia 10/30/2019  . Change in voice 10/30/2019  . History of stroke 09/14/2019  . Spinal stenosis 07/24/2019  . Mixed hyperlipidemia   . OSA (obstructive sleep apnea)   . Dysfunction of eustachian tube 05/15/2019  . Other social stressor 03/29/2019  . 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 Diarrhea 06/20/2018  . Joint pain 06/20/2018  . 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 Medicare annual wellness visit, subsequent 09/15/2017  . SUI (stress urinary incontinence, female) 08/07/2017  . Back pain 06/16/2016  . Advance care planning 01/07/2016  . Sleep apnea   . 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 Allergic rhinitis 11/24/2014  . Adult BMI 30+ 11/24/2014  . Depression 11/24/2014  . Insomnia 08/23/2009  . Headache, migraine 05/10/2009  . HERPES SIMPLEX INFECTION 08/14/2006  . HYPERCHOLESTEROLEMIA 08/14/2006  . Anxiety state 08/14/2006  . Essential hypertension 08/14/2006  . HIATAL HERNIA 08/14/2006  . IRRITABLE BOWEL SYNDROME 08/14/2006  . 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 Primary osteoarthritis of right knee 08/14/2006  . PLANTAR FASCIITIS 08/14/2006    Past Surgical History:  Procedure Laterality Date  . BLADDER SURGERY    . BREAST SURGERY     breast biopsy  . BROW  LIFT Bilateral 04/14/2017   Procedure: BLEPHAROPLASTY UPPER EYELID WITH EXCESS SKIN;  Surgeon: Karle Starch, MD;  Location: [facility];  Service: Ophthalmology;  Laterality: Bilateral;  . CATARACT EXTRACTION W/PHACO Left 02/05/2016   Procedure: CATARACT EXTRACTION PHACO AND INTRAOCULAR LENS PLACEMENT (IOC);  Surgeon: Birder Robson, MD;  Location: [facility];  Service: Ophthalmology;  Laterality: Left;  [place] 01:10 AP% 22.3 CDE 15.67 Fluid pack lot # [id number]  . CATARACT  EXTRACTION W/PHACO Right 02/26/2016   Procedure: CATARACT EXTRACTION PHACO AND INTRAOCULAR LENS PLACEMENT (IOC);  Surgeon: Birder Robson, MD;  Location: [facility];  Service: Ophthalmology;  Laterality: Right;  [place] 57.4 AP% 24.0 CDE 13.75 Fluid Pack lot # [id number]  . CHOLECYSTECTOMY    . COLONOSCOPY WITH PROPOFOL N/A 12/18/2014   Procedure: COLONOSCOPY WITH PROPOFOL;  Surgeon: Manya Silvas, MD;  Location: [facility];  Service: Endoscopy;  Laterality: N/A;  . DEEP NECK LYMPH NODE BIOPSY / EXCISION    . implantable loop recorder implantation  06/20/2020   Medtronic Reveal Linq model LNQ 22 ([place] [id number]) implantable loop recorder for cryptogenic stroke  . JOINT REPLACEMENT    . KNEE ARTHROPLASTY Left 06/20/2015   Procedure: COMPUTER ASSISTED TOTAL KNEE ARTHROPLASTY;  Surgeon: Dereck Leep, MD;  Location: [facility];  Service: Orthopedics;  Laterality: Left;  . PTOSIS REPAIR Bilateral 04/14/2017   Procedure: PTOSIS REPAIR RESECT EX;  Surgeon: Karle Starch, MD;  Location: [facility];  Service: Ophthalmology;  Laterality: Bilateral;  sleep apnea  . TONSILLECTOMY    . TOTAL HIP ARTHROPLASTY Right 07/21/2018   Procedure: TOTAL HIP ARTHROPLASTY ANTERIOR APPROACH;  Surgeon: Gaynelle Arabian, MD;  Location: [facility];  Service: Orthopedics;  Laterality: Right;     OB History   No obstetric history on file.     Family History  Problem Relation Age of Onset  . Heart attack Mother   . Dementia Father   . Heart attack Father 34  . Aortic aneurysm Brother   . Stroke Paternal Grandfather   . Colon cancer Neg Hx   . Breast cancer Neg Hx     Social History   Tobacco Use  . Smoking status: Never Smoker  . Smokeless tobacco: Never Used  Vaping Use  . Vaping Use: Never used  Substance Use Topics  . Alcohol use: Yes    Comment: ocassional glass of wine 2-3 times a year  . Drug use: No    Home Medications Prior to Admission medications   Medication Sig Start Date End Date  Taking? Authorizing Provider  acetaminophen (TYLENOL) 500 MG tablet Take 500 mg by mouth every 6 (six) hours as needed for moderate pain.     [provider]  aspirin EC 81 MG tablet Take 81 mg by mouth daily. Swallow whole.    [provider]  atenolol (TENORMIN) 25 MG tablet Take 1 tablet (25 mg total) by mouth 2 (two) times daily. 05/15/20   Tonia Ghent, MD  atorvastatin (LIPITOR) 80 MG tablet TAKE ONE TABLET BY MOUTH EVERY DAY AT 6PM 05/23/20   Tonia Ghent, MD  Biotin 10000 MCG TABS Take 5,000 mcg by mouth daily.     [provider]  BRILINTA 90 MG TABS tablet TAKE ONE TABLET TWICE DAILY 05/21/20   Tonia Ghent, MD  cholecalciferol (VITAMIN D) 1000 UNITS tablet Take 1,000 Units by mouth daily.     [provider]  escitalopram (LEXAPRO) 10 MG tablet Take 1 tablet (10 mg total)  by mouth daily. 07/31/20   Ria Bush, MD  hydrOXYzine (ATARAX/VISTARIL) 10 MG tablet TAKE 1/2-1 TABLET BY MOUTH 3 TIMES DAILYAS NEEDED FOR ANXIETY (SEDATION CAUTION) 03/12/20   Tonia Ghent, MD  ipratropium (ATROVENT) 0.06 % nasal spray one spray per nostril twice daily (up to three times daily) to help with runny nose 03/28/20   Valentina Shaggy, MD  magnesium oxide (MAG-OX) 400 MG tablet Take 500 mg by mouth daily.    [provider]  montelukast (SINGULAIR) 10 MG tablet Take 1 tablet (10 mg total) by mouth at bedtime. 03/28/20   Valentina Shaggy, MD  polyethylene glycol powder [facility]) 17 GM/SCOOP powder Take 17 g by mouth daily as needed for mild constipation. 05/15/20   Tonia Ghent, MD  promethazine-dextromethorphan (PROMETHAZINE-DM) 6.25-15 MG/5ML syrup TAKE 2.5-5 MLS BY MOUTH 4 TIMES DAILY ASNEEDED FOR COUGH (SEDATION CAUTION). 07/24/20   Tonia Ghent, MD    Allergies    Hydrocodone  Review of Systems   Review of Systems  Constitutional: Negative for chills and fever.  HENT: Negative for ear pain and sore throat.    Eyes: Negative for pain and visual disturbance.  Respiratory: Negative for cough and shortness of breath.   Cardiovascular: Negative for chest pain, palpitations and syncope.  Gastrointestinal: Negative for abdominal pain and vomiting.  Genitourinary: Negative for dysuria and hematuria.  Musculoskeletal: Positive for falls. Negative for arthralgias and back pain.  Skin: Negative for color change and rash.  Neurological: Positive for weakness. Negative for dizziness, seizures, syncope and headaches.  All other systems reviewed and are negative.   Physical Exam Updated Vital Signs BP 132/85 (BP Location: Right Arm)   Pulse 80   Temp 97.8 F (36.6 C) (Oral)   Resp 16   SpO2 97%   Physical Exam Vitals and nursing note reviewed.  Constitutional:      General: She is not in acute distress.    Appearance: She is well-developed.  HENT:     Head: Normocephalic and atraumatic.  Eyes:     Conjunctiva/sclera: Conjunctivae normal.  Cardiovascular:     Rate and Rhythm: Normal rate and regular rhythm.     Heart sounds: No murmur heard.   Pulmonary:     Effort: Pulmonary effort is normal. No respiratory distress.     Breath sounds: Normal breath sounds.  Abdominal:     Palpations: Abdomen is soft.     Tenderness: There is no abdominal tenderness.  Musculoskeletal:     Cervical back: Neck supple.  Skin:    General: Skin is warm and dry.  Neurological:     Mental Status: She is alert and oriented to person, place, and time.     Cranial Nerves: No cranial nerve deficit.     Sensory: No sensory deficit.     Motor: No weakness.     Coordination: Coordination normal.  Psychiatric:        Mood and Affect: Mood normal.     ED Results / Procedures / Treatments   Labs (all labs ordered are listed, but only abnormal results are displayed) Labs Reviewed  COMPREHENSIVE METABOLIC PANEL - Abnormal; Notable for the following components:      Result Value   Glucose, Bld 101 (*)    All  other components within normal limits  URINALYSIS, ROUTINE W REFLEX MICROSCOPIC - Abnormal; Notable for the following components:   Ketones, ur 5 (*)    All other components within normal limits  CBC WITH  DIFFERENTIAL/PLATELET  PROTIME-INR    EKG None  Radiology CT Head Wo Contrast  Result Date: 08/04/2020 CLINICAL DATA:  TIA symptoms, right-sided weakness, history of previous CVA EXAM: CT HEAD WITHOUT CONTRAST TECHNIQUE: Contiguous axial images were obtained from the base of the skull through the vertex without intravenous contrast. COMPARISON:  05/08/2020 FINDINGS: Brain: Chronic encephalomalacia within the right corona radiata and left frontal periventricular white matter consistent with previous infarcts. Scattered hypodensities throughout the bilateral periventricular and subcortical white matter consistent with chronic small vessel ischemic changes. No signs of acute infarct or hemorrhage. Lateral ventricles and midline structures are otherwise stable. No acute extra-axial fluid collections. No mass effect. Vascular: Stable atherosclerosis.  No evidence of hyperdense vessel. Skull: Normal. Negative for fracture or focal lesion. Sinuses/Orbits: No acute finding. Other: None. IMPRESSION: 1. Chronic ischemic changes throughout the bilateral periventricular and subcortical white matter, most pronounced within the right corona radiata and left frontal white matter as above. 2. No acute intracranial process. Electronically Signed   By: Randa Ngo M.D.   On: 08/04/2020 18:50   MR Brain Wo Contrast (neuro protocol)  Result Date: 08/04/2020 CLINICAL DATA:  New onset weakness with fall EXAM: MRI HEAD WITHOUT CONTRAST TECHNIQUE: Multiplanar, multiecho pulse sequences of the brain and surrounding structures were obtained without intravenous contrast. COMPARISON:  04/18/2020 FINDINGS: Brain: No acute infarct, mass effect or extra-axial collection. No acute or chronic hemorrhage. Hyperintense  T2-weighted signal is moderately widespread throughout the white matter. Parenchymal volume and CSF spaces are normal. 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 The midline structures are normal. Vascular: Major flow voids are preserved. Skull and upper cervical spine: Normal calvarium and skull base. Visualized upper cervical spine and soft tissues are normal. Sinuses/Orbits:No paranasal sinus fluid levels or advanced mucosal thickening. No mastoid or middle ear effusion. Normal orbits. IMPRESSION: 1. No acute intracranial abnormality. 2. 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 3. Severe chronic small vessel ischemic disease. Electronically Signed   By: Ulyses Jarred M.D.   On: 08/04/2020 19:43    Procedures Procedures   Medications Ordered in ED Medications - No data to display  ED Course  I have reviewed the triage vital signs and the nursing notes.  Pertinent labs & imaging results that were available during my care of the patient were reviewed by me and considered in my medical decision making (see chart for details).    MDM Rules/Calculators/A&P                          The patient presented after a fall during which her right knee buckled.  She states these are the same symptoms she had with a prior stroke.  The patient I talked about her symptoms, I told her it is most likely related to her knee.  However, she was adamant that she also has had the symptoms with strokes.  She has a strong history of strokes, and she has had CVAs even with dual antiplatelet therapy.  Therefore, she was evaluated for potential stroke.  Both CT and MRI were not significant for acute infarct.  She will be discharged home.  Of note, she has been told in the past that she needs a knee replacement but is not a surgical candidate.  I think her symptoms were most likely related to her knee.  She is currently at her neurologic baseline. Final Clinical Impression(s) / ED  Diagnoses Final diagnoses:  Fall, initial encounter  Instability of right knee joint    Rx / DC Orders ED Discharge Orders    None       Arnaldo Natal, MD 08/04/20 2014

## 2020-08-04 NOTE — ED Triage Notes (Signed)
Patient reports right knee pain after it "gave out" causing fall today. Hx stroke. Denies confusion, new weakness. Ambulatory with walker.

## 2020-08-05 NOTE — Telephone Encounter (Signed)
Noted.  Agreed.  Thanks. 

## 2020-08-08 ENCOUNTER — Telehealth: Payer: Self-pay | Admitting: Adult Health

## 2020-08-08 NOTE — Telephone Encounter (Signed)
Per review of ED note, they had low suspicion for possible stroke with CT and MRI normal. They felt more likely related to knee issue although patient and family did not agree.  I do not believe adding aspirin back in addition to Brilinta would be of any benefit but will also forward to Dr. Leonie Man for further input

## 2020-08-08 NOTE — Telephone Encounter (Signed)
Called daughter of pt.  Pt has been to ED for fall 08-04-20 (r leg), (leg gives out) Stroke riuled out. Then had another fall last night (did not hurt herself), stands then falls (r leg).  Daughter does not feel related to r knee (although states she does need knee replacement). Questioning if needing aspiriin 81mg  would help as was on Brilinta and aspirin prior to 07-23-20.  She has had respiratory virus 2-3 wks ago, mentions that some dizziness and unsteadiness at that time started (non covid).  She is better now, still with dizziness.  Is eating now (although was not well previously) and drinking ok.  Did well with HHPT ? Also if this would help again.  Please advise.  Staring aspirin again with brilinta and if PT would help.

## 2020-08-08 NOTE — Telephone Encounter (Signed)
Pt's leg went out on her on Saturday & again last night.  On Saturday pt was taken to ED no evidence of a stroke from CT scan& MRI.  Daughter asking for a call to discuss if pt needs to go back on Aspirin

## 2020-08-08 NOTE — Telephone Encounter (Signed)
Spoke to daughter of pt and relayed to her Jessica's note and forwarded to Dr. Leonie Man for his input. Will let her know once we hear back.

## 2020-08-09 NOTE — Telephone Encounter (Signed)
Pt left v/m that Dr Darnell Level increased lexapro from 5 mg to 10 mg due to pts anxiety being increased.pt wants to know if increasing lexapro to 10 mg could cause pts legs to feel weak;pt keeps walker with her at all times now. Pt feels more sleepy today. Pt seen in [place] ED on 08/04/20. Pt said ED advised her symptoms on 08/04/20 were not stroke related. Do you want pt to have EDFU or advice over phone. Pt request cb.

## 2020-08-09 NOTE — Telephone Encounter (Signed)
LMTCB

## 2020-08-09 NOTE — Telephone Encounter (Signed)
I wouldn't expect the med to be the issue, I would continue as is. Please offer f/u.  Thanks.

## 2020-08-10 NOTE — Telephone Encounter (Signed)
I agree no need to add aspirin as it will increase bleed risk

## 2020-08-10 NOTE — Telephone Encounter (Signed)
Spoke with patient about below message. She declined f/u at this time and will continue as is. She will call back if anything changes and needs to be seen.

## 2020-08-13 ENCOUNTER — Other Ambulatory Visit: Payer: Self-pay | Admitting: Family Medicine

## 2020-08-13 NOTE — Telephone Encounter (Signed)
Lovey Newcomer, can you please let daughter know no additional aspirin indicated at this time. Thank you

## 2020-08-13 NOTE — Telephone Encounter (Signed)
Kindly inform the patient that she has a one-time dose of antibiotics which is a prophylaxis to prevent infection from spreading in the blood while cleaning the teeth.  This is a standard practice and the dose is adequate and the antibiotic should not interfere with other medicines

## 2020-08-13 NOTE — Telephone Encounter (Signed)
Spoke to patient and relayed Dr. Josefine Class message that no additional aspirin was indicated at this time.  She is now concerned because she is having her teeth cleaned on Thursday.  Stated she was given a prescription of amoxicillan 500 mg x 4 tablets to take 1 hour prior to procedure.  She is concerned that is too much to take and wants to make sure it will not interfere with anything she is taking.

## 2020-08-14 NOTE — Telephone Encounter (Signed)
Called and spoke with pt. Relayed Dr. Clydene Fake message. She verbalized understanding and appreciation.

## 2020-08-27 ENCOUNTER — Ambulatory Visit (INDEPENDENT_AMBULATORY_CARE_PROVIDER_SITE_OTHER): Payer: Medicare HMO

## 2020-08-27 DIAGNOSIS — I639 Cerebral infarction, unspecified: Secondary | ICD-10-CM

## 2020-08-28 ENCOUNTER — Other Ambulatory Visit: Payer: Self-pay | Admitting: Allergy & Immunology

## 2020-08-28 ENCOUNTER — Other Ambulatory Visit: Payer: Self-pay

## 2020-08-28 ENCOUNTER — Encounter: Payer: Self-pay | Admitting: Family Medicine

## 2020-08-28 ENCOUNTER — Ambulatory Visit (INDEPENDENT_AMBULATORY_CARE_PROVIDER_SITE_OTHER): Payer: Medicare HMO | Admitting: Family Medicine

## 2020-08-28 DIAGNOSIS — M4800 Spinal stenosis, site unspecified: Secondary | ICD-10-CM

## 2020-08-28 DIAGNOSIS — F411 Generalized anxiety disorder: Secondary | ICD-10-CM

## 2020-08-28 DIAGNOSIS — Z8673 Personal history of transient ischemic attack (TIA), and cerebral infarction without residual deficits: Secondary | ICD-10-CM

## 2020-08-28 DIAGNOSIS — K589 Irritable bowel syndrome without diarrhea: Secondary | ICD-10-CM | POA: Diagnosis not present

## 2020-08-28 DIAGNOSIS — B029 Zoster without complications: Secondary | ICD-10-CM | POA: Diagnosis not present

## 2020-08-28 LAB — CUP PACEART REMOTE DEVICE CHECK
Date Time Interrogation Session: 20220403094848
Implantable Pulse Generator Implant Date: 20220126

## 2020-08-28 MED ORDER — VALACYCLOVIR HCL 1 G PO TABS: 1000.0000 mg | ORAL_TABLET | Freq: Three times a day (TID) | ORAL | 0 refills | Status: DC

## 2020-08-28 NOTE — Patient Instructions (Addendum)
Let me check with neurology.  I would keep taking lexapro in the mornings. Update me in about 10 days, sooner if needed.  Please check in with [name].   Start valtrex in the meantime.   Hold miralax and magnesium for now. I would only take miralax on the 3rd day if no BM.   Take care.  Glad to see you.

## 2020-08-28 NOTE — Progress Notes (Signed)
This visit occurred during the SARS-CoV-2 public health emergency.  Safety protocols were in place, including screening questions prior to the visit, additional usage of staff PPE, and extensive cleaning of exam room while observing appropriate contact time as indicated for disinfecting solutions.  As of today/recently, on brilinta BID, not on aspirin.  On atorvastatin 80mg  a day.   She has the recurrent thought of "Let me get through this hour/day."  She thought she historically had OCD tendencies.  She had inc in lexapro, up to 10mg  daily, since last month.  She has h/o worries about old concerns/conversations, and that interrupts her sleep.  Morning use of lexapro may have helped, that was a recent change.  Had used hydroxyzine prev, up to 10mg  per dose at night.  She slept better with change to AM dosing lexapro.    She had a cold in 06/2020.  She is also off aspirin.  Sometimes family has noted speech changes in the meantime, episodic limb weakness.  Now using a walker.  Those changes noted since stopping aspirin.    Family noted episodic confusion, "overthinking", and anxiety.  She has chronic diet changes with altered taste.    She had prev injection per [name], pain on getting up from bed in the AM.  Tylenol helps.  I asked her to f/u with [name].    Recently with dermatomal itchy rash on the L abd.    She is alternating from diarrhea and constipation.  She stopped miralax and magnesium in the meantime.  Discussed options.    Meds, vitals, and allergies reviewed.   ROS: Per HPI unless specifically indicated in ROS section   nad ncat Neck supple, no LA rrr ctab abd soft, not ttp Normal L hip ROM.   L sided dermatomal rash on the abdomen.  On back and abdomen, doesn't cross midline.   43 minutes were devoted to patient care in this encounter (this includes time spent reviewing the patient's file/history, interviewing and examining the patient, counseling/reviewing plan with  patient).

## 2020-08-30 ENCOUNTER — Telehealth: Payer: Self-pay | Admitting: Family Medicine

## 2020-08-30 DIAGNOSIS — B029 Zoster without complications: Secondary | ICD-10-CM | POA: Insufficient documentation

## 2020-08-30 NOTE — Assessment & Plan Note (Signed)
Start valtrex, routine cautions d/w pt.

## 2020-08-30 NOTE — Telephone Encounter (Signed)
Please see my OV note.  Pt/family wanted me to check with you about restarting/adding aspirin.  She has some waxing and waning sx but no clear/definite recent events.  Noted since stopping aspirin.  I suspect that is coincidental. She is still on brilinta.    I saw your prev note and thought that risk>benefit with adding asprin to brilinta, but wanted your input for reassurance.  Thanks.

## 2020-08-30 NOTE — Assessment & Plan Note (Signed)
Advised to miralax and magnesium for now. I would only take miralax on the 3rd day if no BM.  She agrees.

## 2020-08-30 NOTE — Assessment & Plan Note (Signed)
I would keep taking lexapro in the mornings. asked her to update me in about 10 days, sooner if needed.

## 2020-08-30 NOTE — Assessment & Plan Note (Signed)
She has had some waxing and waning sx over the last few months, which I suspect are multifactorial and not new acute events.  Patient/family asked about restart aspirin and I will check with neurology, though I don't suspect her to have new events.  Discussed.

## 2020-08-30 NOTE — Assessment & Plan Note (Signed)
I asked her to day please check in with [name]. Normal hip ROM.

## 2020-09-02 NOTE — Telephone Encounter (Signed)
Appreciate neurology input.  See note below.  Thanks.  I would not add back aspirin.

## 2020-09-02 NOTE — Telephone Encounter (Signed)
I do not see any benefit of added aspirin to Brilinta beyond the first 4 weeks for stroke patients for secondary prevention hence we will stop it.  Since her recurrent symptoms are not clearly stroke or TIA I would be inclined to continue Brilinta alone

## 2020-09-03 ENCOUNTER — Encounter: Payer: Self-pay | Admitting: Family Medicine

## 2020-09-05 ENCOUNTER — Other Ambulatory Visit: Payer: Self-pay | Admitting: Family Medicine

## 2020-09-05 MED ORDER — ESCITALOPRAM OXALATE 10 MG PO TABS: 15.0000 mg | ORAL_TABLET | Freq: Every morning | ORAL | Status: DC

## 2020-09-05 NOTE — Telephone Encounter (Signed)
Notified patient that she does not need to go back on aspirin; patient okay with this and verbalized understanding.

## 2020-09-06 NOTE — Progress Notes (Signed)
Carelink Summary Report / Loop Recorder 

## 2020-09-08 ENCOUNTER — Other Ambulatory Visit: Payer: Self-pay | Admitting: Family Medicine

## 2020-09-10 ENCOUNTER — Telehealth: Payer: Self-pay | Admitting: Adult Health

## 2020-09-10 NOTE — Telephone Encounter (Signed)
Called and spoke to daughter of pt.  She is having LE weakness, forgetfulness/confusion. Seems to be going downhill, seen pcp, increased lexapro for anxiety/depression. Daughter concerned wanted JM/NP to see difference since 06/2020 visit. Made appt Wednesday at [place] 09-12-20.

## 2020-09-10 NOTE — Telephone Encounter (Signed)
Pt's daughter, Lanice Schwab (on [place]) called, for a few weeks she has had weakness in her legs, confusion, not sleeping. Would like a call from the nurse to discuss a sooner appt than 6/13.

## 2020-09-12 ENCOUNTER — Encounter: Payer: Self-pay | Admitting: Adult Health

## 2020-09-12 ENCOUNTER — Ambulatory Visit: Payer: Medicare HMO | Admitting: Adult Health

## 2020-09-12 ENCOUNTER — Telehealth: Payer: Self-pay | Admitting: Family Medicine

## 2020-09-12 VITALS — BP 142/86 | HR 76 | Ht 60.0 in | Wt 138.0 lb

## 2020-09-12 DIAGNOSIS — G4733 Obstructive sleep apnea (adult) (pediatric): Secondary | ICD-10-CM

## 2020-09-12 DIAGNOSIS — Z8673 Personal history of transient ischemic attack (TIA), and cerebral infarction without residual deficits: Secondary | ICD-10-CM | POA: Diagnosis not present

## 2020-09-12 DIAGNOSIS — F411 Generalized anxiety disorder: Secondary | ICD-10-CM | POA: Diagnosis not present

## 2020-09-12 NOTE — Patient Instructions (Signed)
Your Plan:  Continue current regimen at this time  Will discuss anxiety with Dr. Lupita Dawn regarding further treatment options  Will discuss further back concerns with lower back       Thank you for coming to see [place] at [facility]. I hope we have been able to provide you high quality care today.  You may receive a patient satisfaction survey over the next few weeks. We would appreciate your feedback and comments so that we may continue to improve ourselves and the health of our patients.     Generalized Anxiety Disorder, Adult Generalized anxiety disorder (GAD) is a mental health condition. Unlike normal worries, anxiety related to GAD is not triggered by a specific event. These worries do not fade or get better with time. GAD interferes with relationships, work, and school. GAD symptoms can vary from mild to severe. People with severe GAD can have intense waves of anxiety with physical symptoms that are similar to panic attacks. What are the causes? The exact cause of GAD is not known, but the following are believed to have an impact:  Differences in natural brain chemicals.  Genes passed down from parents to children.  Differences in the way threats are perceived.  Development during childhood.  Personality. What increases the risk? The following factors may make you more likely to develop this condition:  Being female.  Having a family history of anxiety disorders.  Being very shy.  Experiencing very stressful life events, such as the death of a loved one.  Having a very stressful family environment. What are the signs or symptoms? People with GAD often worry excessively about many things in their lives, such as their health and family. Symptoms may also include:  Mental and emotional symptoms: ? Worrying excessively about natural disasters. ? Fear of being late. ? Difficulty concentrating. ? Fears that others are judging your  performance.  Physical symptoms: ? Fatigue. ? Headaches, muscle tension, muscle twitches, trembling, or feeling shaky. ? Feeling like your heart is pounding or beating very fast. ? Feeling out of breath or like you cannot take a deep breath. ? Having trouble falling asleep or staying asleep, or experiencing restlessness. ? Sweating. ? Nausea, diarrhea, or irritable bowel syndrome (IBS).  Behavioral symptoms: ? Experiencing erratic moods or irritability. ? Avoidance of new situations. ? Avoidance of people. ? Extreme difficulty making decisions. How is this diagnosed? This condition is diagnosed based on your symptoms and medical history. You will also have a physical exam. Your health care provider may perform tests to rule out other possible causes of your symptoms. To be diagnosed with GAD, a person must have anxiety that:  Is out of his or her control.  Affects several different aspects of his or her life, such as work and relationships.  Causes distress that makes him or her unable to take part in normal activities.  Includes at least three symptoms of GAD, such as restlessness, fatigue, trouble concentrating, irritability, muscle tension, or sleep problems. Before your health care provider can confirm a diagnosis of GAD, these symptoms must be present more days than they are not, and they must last for 6 months or longer. How is this treated? This condition may be treated with:  Medicine. Antidepressant medicine is usually prescribed for long-term daily control. Anti-anxiety medicines may be added in severe cases, especially when panic attacks occur.  Talk therapy (psychotherapy). Certain types of talk therapy can be helpful in treating GAD by providing support, education, and  guidance. Options include: ? Cognitive behavioral therapy (CBT). People learn coping skills and self-calming techniques to ease their physical symptoms. They learn to identify unrealistic thoughts and  behaviors and to replace them with more appropriate thoughts and behaviors. ? Acceptance and commitment therapy (ACT). This treatment teaches people how to be mindful as a way to cope with unwanted thoughts and feelings. ? Biofeedback. This process trains you to manage your body's response (physiological response) through breathing techniques and relaxation methods. You will work with a therapist while machines are used to monitor your physical symptoms.  Stress management techniques. These include yoga, meditation, and exercise. A mental health specialist can help determine which treatment is best for you. Some people see improvement with one type of therapy. However, other people require a combination of therapies.   Follow these instructions at home: Lifestyle  Maintain a consistent routine and schedule.  Anticipate stressful situations. Create a plan, and allow extra time to work with your plan.  Practice stress management or self-calming techniques that you have learned from your therapist or your health care provider. General instructions  Take over-the-counter and prescription medicines only as told by your health care provider.  Understand that you are likely to have setbacks. Accept this and be kind to yourself as you persist to take better care of yourself.  Recognize and accept your accomplishments, even if you judge them as small.  Keep all follow-up visits as told by your health care provider. This is important. Contact a health care provider if:  Your symptoms do not get better.  Your symptoms get worse.  You have signs of depression, such as: ? A persistently sad or irritable mood. ? Loss of enjoyment in activities that used to bring you joy. ? Change in weight or eating. ? Changes in sleeping habits. ? Avoiding friends or family members. ? Loss of energy for normal tasks. ? Feelings of guilt or worthlessness. Get help right away if:  You have serious thoughts  about hurting yourself or others. If you ever feel like you may hurt yourself or others, or have thoughts about taking your own life, get help right away. Go to your nearest emergency department or:  Call your local emergency services (911 in the [place]  Call a suicide crisis helpline, such as the [facility] at 432-137-1095. This is open 24 hours a day in the [place]  Text the Crisis Text Line at (631)872-3043 (in the [place].). Summary  Generalized anxiety disorder (GAD) is a mental health condition that involves worry that is not triggered by a specific event.  People with GAD often worry excessively about many things in their lives, such as their health and family.  GAD may cause symptoms such as restlessness, trouble concentrating, sleep problems, frequent sweating, nausea, diarrhea, headaches, and trembling or muscle twitching.  A mental health specialist can help determine which treatment is best for you. Some people see improvement with one type of therapy. However, other people require a combination of therapies. This information is not intended to replace advice given to you by your health care provider. Make sure you discuss any questions you have with your health care provider. Document Revised: 03/02/2019 Document Reviewed: 03/02/2019 Elsevier Patient Education  [place].

## 2020-09-12 NOTE — Progress Notes (Signed)
[facility] [address] [place]. [place] [address] [phone]       STROKE FOLLOW UP NOTE  Ms. LUCIE [name] Date of Birth:  1940/07/20 Medical Record Number:  [id number]   Reason for Referral: stroke follow up    SUBJECTIVE:   CHIEF COMPLAINT:  Chief Complaint  Patient presents with  . Follow-up    RM 14 with daughter ([name] [name]) Pt has gone down hill since last visit last month per daughter, involuntary movement in legs, unsteadiness, forgetfulness, weak and confusion.     HPI:   Today, 09/12/2020, [name] [name] returns for acute visit regarding new onset symptoms accompanied by her daughter  Approx 1 month ago, she suffered a fall after her right leg gave out.  She was fully evaluated in ED who felt likely due to chronic knee pain but per family request, CT head and MRI brain completed which was unremarkable for acute stroke.  Since that time, daughter has noticed worsening memory with increased times of confusion, forgetfulness and unsteadiness. Per daughter, symptoms onset around the time aspirin was discontinued.  Patient has chronic history of low back pain previously seen by [name] in 10/2019 but no recent follow-up as this was relatively stable but over the past month, pain has been more consistent and more frequent episodes of bilateral hip weakness sensation and right leg weakness typically occurring after prolonged ambulation.  She does have scheduled visit with [name] on 4/28 to undergo nerve block injections.   She does continue to live independently but her daughter checks on her routinely and assists with IADLs.  She has been greatly struggling with anxiety over the past month.  She does have history of anxiety but recently greatly worsened.  PCP recently started Lexapro and recommended increasing to 15 mg daily but she is very hesitant to increase as she read online Lexapro can worsen anxiety.  She is also been struggling with insomnia as her  mind will constantly wander with hydroxyzine prescribed by PCP but she denies much benefit.  She speaks of difficulty with her relationships as she has been limiting social interactions due to her low back pain and fear of her right leg giving out - she also feels like she is constantly being judged by her friends or worried about what they are thinking of her.  She is concerned if she misses a social event due to her low back pain and what sounds like social anxiety.  She is also fearful of undergoing nerve block injections as she is on Brilinta and feels like this should be discontinued prior to injection but was told by [name] nurse assistant that that was not necessary.  Of note, seen by PCP Dr. Damita Dunnings 4/5 regarding these concerns who felt multifactorial and likely not new acute strokes.  She was also treated for shingles with Valtrex.  She has remained on Brilinta and atorvastatin without associated side effects Blood pressure 142/86 -occasionally monitored at home and typically stable She does endorse nightly compliance with CPAP for OSA management Loop recorder has not shown atrial fibrillation thus far  No further concerns at this time      History provided for reference purposes only Update 07/04/2020 JM: [name] [name] returns for scheduled follow-up regarding prior strokes accompanied by her daughter. She has been stable since prior visit without any additional stroke/TIA symptoms. Reports residual left hand weakness and gait impairment. She will use RW at times especially upon awakening but otherwise doesn't need AD.  She denies any recent falls.  She has remained on Brilinta, aspirin and atorvastatin 80 mg daily without side effects.  32-month aspirin and Brilinta duration will be completed at the end of this month.  Blood pressure today 121/74.  Monitors at home and typically stable.  Reports ongoing compliance with CPAP followed by Dr. Rexene Alberts. Due to concern of multiple strokes etiology  cryptogenic, loop recorder placed by Dr. Rayann Heman on 06/20/2020 for further long-term monitoring of atrial fibrillation.  No further concerns at this time.  Update 05/14/2020 JM: [name] [name] returns per request to follow-up on recent hospitalizations.  She is accompanied by her daughter.  Personally reviewed recent hospitalizations with summary provided below.  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  P2 Y 12 4 showing inadequate Plavix function therefore switched to aspirin 81 mg daily and Brilinta 90 mg twice daily for 3 months and then continue on Brilinta alone.  Initially recommended discharge to CIR but due to functional improvement she was discharged home on 04/23/2020 with home health therapies.  She then presented to [facility] ED on 05/08/2020 for 1 wk hx of intermittent LUE and LLE weakness, numbness/tingling and right-sided headache but due to wait time she left prior to full evaluation and went to [facility] ED with MRI reporting acute to early subacute infarct in the left centrum semiovale which was not consistent with presenting symptoms.  Per further review with neurologist, it was felt as though reported new infarct seen on prior MRIs therefore no evidence of new stroke.  Evaluated by neurology with note personally reviewed and felt possible cause of symptoms in setting of hypoperfusion with known intracranial arthrosclerosis and atenolol dosage decreased.  She then returned to [facility] ED yesterday, 05/13/2020, for left lower extremity heaviness, palpitations and headache.  Found to be hypertensive with BP 196/105.  No neurological deficits noted.  Symptoms resolved without intervention.  Mention of increased anxiety possibly contributing to symptoms.  CT head negative.  She was discharged back home in stable condition.  She reports continued  intermittent b/l hand weakness where she will occasionally drop items and continued gait impairment currently working with [facility] PT. currently using rolling walker for ambulation.  Denies new or worsening stroke/TIA symptoms.  Remains on aspirin 81 mg daily and Brilinta without bleeding or bruising.  Remains on atorvastatin 80 mg daily without myalgias.  Blood pressure today 128/75.  She does routinely monitor at home but she questions accuracy as her results are consistently 150s.  She does report nightly use of CPAP for OSA management.  Completed approximately 14 days of cardiac monitor which was negative for atrial fibrillation.  She questions need of ILR.  She does endorse increased anxiety regarding multiple strokes and WHEN (not if) another one will occur.  No further concerns at this time.  Update 03/01/2020 JM: Ms. Crosby returns for sooner scheduled visit per PCP request due to recent stroke accompanied by her daughter.  She presented to [facility] regional emergency room on 02/16/2020 with right-sided weakness contributing to fall.  MRI showed left frontal lobe infarcts likely from stenosis and left M2 all within the same vascular territory and was not felt to be embolic.  MRA head showed progression of stenosis within the distal A2/proximal artery which is now severe with additional intracranial atherosclerotic stenosis without interval change including severe focal stenosis within the cavernous left ICA, moderate  focal stenosis within the cavernous right ICA, moderate stenosis with A2 right ACA and moderate stenosis within the proximal P2 left PCA.  2D echo showed EF of 60 to 65%.  On aspirin PTA and recommended DAPT " but long-term she may need Effient instead of Plavix or Brilinta".  Recommended to check Plavix assays outpatient to ensure adequate response.  HTN stable.  LDL 54 recommended continuation of atorvastatin 80 mg daily.  She did have right knee pain secondary to fall and recommended follow-up  with orthopedics outpatient.  Evaluated by therapy initially recommended [facility] PT/OT but improved prior to discharge with [facility] therapy no longer indicated.  She was discharged home in stable condition and advised to follow-up with PCP, neurology and orthopedics.    She has been stable since returning home without new or reoccurring stroke/TIA symptoms.  Denies residual right-sided symptoms but does continue to have chronic right knee pain and ankle pain post fall which has been limiting her ambulation.  She is currently working with [facility] PT and ambulating with rolling walker due to right leg pain.  Continues to have mild dysarthria and decreased LUE dexterity which has been stable after recent stroke and improvement since prior visit.  She has continued on aspirin and Plavix without bleeding or bruising.  Remains on atorvastatin 80 mg daily without myalgias.  Blood pressure today satisfactory at 112/69.  She reports intermittent use of CPAP for sleep apnea management with difficulty tolerating due to increased pressure.  Reports undergoing sleep study over 10 years ago and has not had any repeat study or follow-up to ensure adequate management of apnea.  Multiple questions regarding recent stroke and etiology as well as recommended Plavix lab work Nurse, children's) as recommended by [employer] neurologist.  Further reviewed recent admission and imaging with Dr. Leonie Man due to concern of stroke locations and possible embolic etiology. Per Dr. Leonie Man, he does not believe recent stroke was due to large vessel disease but more so embolic secondary to unknown source as her recent imaging showed infarcts within different vascular locations including ACA and MCA.  Also reviewed imaging from prior stroke in 06/2019 and due to size, potentially embolic. He recommends further cardiac evaluation with placement of loop recorder to assess for possible atrial fibrillation as stroke etiology.  No indication for TEE. He recommends aspirin  and Plavix for only 3-week duration and then Plavix alone.  No indication for P2 Y 12 testing as she has no history or evidence of Plavix failure or concern.  Update 01/02/2020 JM: Ms. Heiner is being seen for stroke follow-up previously followed through [facility] research participating in [place] stroke trial. Evaluated during stroke trial on 10/10/2019 recently completing [place] therapies with residual deficits of mild decrease left hand dexterity, imbalance and dysarthria therefore additional orders placed for outpatient therapies.  She does report ongoing improvement and continues to work with neuro rehab PT/OT/ST for residual dysarthria and dysphonia, gait impairment/imbalance and LUE incoordination. Evaluated in [place] by Dr. 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  Reports continued participation SLP with [facility] Reports imbalance greatest difficulty still with improvement but also underlying chronic lower back, bilateral hip and knee pain.  She wishes to proceed with additional injections and nerve blocks therefore withdrew from research study approximately 1 month ago.  Continues to follow with [facility] She remains on aspirin 81mg  daily without bleeding or bruising. Continues on atorvastatin 80 mg daily without myalgias.  Recent lipid panel 10/13/2019 showed LDL  64.  Blood pressure today 116/70.  No further concerns.  Stroke admission 07/11/2019 Personally reviewed recent hospitalization pertinent notes, labs and imaging Ms. JASLIN NOVITSKI is a 80 y.o. female with history of HTN, HLD, obesity who presented on 07/11/2019 with dysarthria, L facial droop, mild L hemiparesis as well as hypertensive urgency.  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  Recommended DAPT for 3 weeks and aspirin alone.  BP upon arrival 202/110 stabilized during admission and recommended long-term BP goal normotensive range.  LDL 130  and initiate atorvastatin 80 mg daily. Other stroke risk factors include advanced age, EtOH use, OSA on CPAP and family reported hx imaging with evidence of small AAA.  Evaluated by therapy and recommend discharge to CIR for ongoing therapy needs but unfortunately did not by insurance therefore discharged home with home health therapy.  Also advised to follow-up with [facility] research is interested in [employer] stroke trial.  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   MRI  R corona radiata and caudate head small vessel disease. infarcts  CTA head & neck negative LVO, negative arthrosclerosis or stenosis in neck, positive for intracranial arthrosclerosis with significant stenosis within moderate to severe left ICA anterior genu, moderate bilateral ICA distally to and moderate left PCA P1  2D Echo  EF 50 to 55%  LDL 130  HgbA1c 5.5  Lovenox 40 mg sq daily for VTE prophylaxis  No antithrombotic prior to admission, now on clopidogrel 75 mg daily and aspirin 324 mg. Change aspirin to 81. Continue DAPT x 3 weeks then aspirin alone.   Therapy recommendations: CLR -insurance denied therefore recommended HH therapy  Disposition: Home  Recommend aspirin and Plavix for 3 weeks followed by aspirin alone. She is  participating in the [employer] stroke prevention trial( standard of care antiplatelets and Factor xi inhibitor versus standard of care antiplates and placebo)         ROS:   14 system review of systems performed and negative with exception of see HPI   PMH:  Past Medical History:  Diagnosis Date  . Arthritis    knees  . Depression   . Fatty liver   . GERD (gastroesophageal reflux disease)   . Hyperlipidemia   . Hypertension   . IBS (irritable bowel syndrome)   . Obesity   . Pneumonia   . Sleep apnea    uses CPAP  . Stroke (cerebrum) (HCC)     PSH:  Past Surgical History:  Procedure Laterality  Date  . BLADDER SURGERY    . BREAST SURGERY     breast biopsy  . BROW LIFT Bilateral 04/14/2017   Procedure: BLEPHAROPLASTY UPPER EYELID WITH EXCESS SKIN;  Surgeon: Karle Starch, MD;  Location: [facility];  Service: Ophthalmology;  Laterality: Bilateral;  . CATARACT EXTRACTION W/PHACO Left 02/05/2016   Procedure: CATARACT EXTRACTION PHACO AND INTRAOCULAR LENS PLACEMENT (IOC);  Surgeon: Birder Robson, MD;  Location: [facility];  Service: Ophthalmology;  Laterality: Left;  [place] 01:10 AP% 22.3 CDE 15.67 Fluid pack lot # [id number]  . CATARACT EXTRACTION W/PHACO Right 02/26/2016   Procedure: CATARACT EXTRACTION PHACO AND INTRAOCULAR LENS PLACEMENT (IOC);  Surgeon: Birder Robson, MD;  Location: [facility];  Service: Ophthalmology;  Laterality: Right;  [place] 57.4 AP% 24.0 CDE 13.75 Fluid Pack lot # [id number]  . CHOLECYSTECTOMY    . COLONOSCOPY WITH PROPOFOL N/A  12/18/2014   Procedure: COLONOSCOPY WITH PROPOFOL;  Surgeon: Manya Silvas, MD;  Location: [facility] ENDOSCOPY;  Service: Endoscopy;  Laterality: N/A;  . DEEP NECK LYMPH NODE BIOPSY / EXCISION    . implantable loop recorder implantation  06/20/2020   Medtronic Reveal Linq model LNQ 22 ([place] [id number]) implantable loop recorder for cryptogenic stroke  . JOINT REPLACEMENT    . KNEE ARTHROPLASTY Left 06/20/2015   Procedure: COMPUTER ASSISTED TOTAL KNEE ARTHROPLASTY;  Surgeon: Dereck Leep, MD;  Location: [facility];  Service: Orthopedics;  Laterality: Left;  . PTOSIS REPAIR Bilateral 04/14/2017   Procedure: PTOSIS REPAIR RESECT EX;  Surgeon: Karle Starch, MD;  [facility];  Service: Ophthalmology;  Laterality: Bilateral;  sleep apnea  . TONSILLECTOMY    . TOTAL HIP ARTHROPLASTY Right 07/21/2018   Procedure: TOTAL HIP ARTHROPLASTY ANTERIOR APPROACH;  Surgeon: Gaynelle Arabian, MD;  Location: [facility];  Service: Orthopedics;  Laterality: Right;    Social History:  Social History   Socioeconomic History  . Marital status:  Widowed    Spouse name: Not on file  . Number of children: Not on file  . Years of education: Not on file  . Highest education level: Not on file  Occupational History  . Occupation: retired  Tobacco Use  . Smoking status: Never Smoker  . Smokeless tobacco: Never Used  Vaping Use  . Vaping Use: Never used  Substance and Sexual Activity  . Alcohol use: Yes    Comment: ocassional glass of wine 2-3 times a year  . Drug use: No  . Sexual activity: Never  Other Topics Concern  . 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 Heart attack Mother   . Dementia Father   . Heart attack Father 32  . Aortic aneurysm Brother   . Stroke Paternal Grandfather   . Colon cancer Neg Hx   . Breast cancer Neg Hx     Medications:   Current Outpatient Medications on File Prior to Visit  Medication Sig Dispense Refill  . acetaminophen (TYLENOL) 500 MG tablet Take 500 mg by mouth every 6 (six) hours as needed for moderate pain.     [name] Kitchen atenolol (TENORMIN) 25 MG tablet Take 1 tablet (25 mg total) by mouth 2 (two) times daily.    [name] Kitchen atorvastatin (LIPITOR) 80 MG tablet TAKE ONE TABLET BY MOUTH EVERY DAY AT 6PM 30 tablet 5  . BRILINTA 90 MG TABS tablet TAKE ONE TABLET TWICE DAILY 60 tablet 2  . cholecalciferol (VITAMIN D) 1000 UNITS tablet Take 1,000 Units by mouth daily.     [name] Kitchen escitalopram (LEXAPRO) 10 MG tablet Take 1.5 tablets (15 mg total) by mouth in the morning.    . hydrOXYzine (ATARAX/VISTARIL) 10 MG tablet TAKE 1/2-1 TABLET BY MOUTH  3 TIMES DAILYAS NEEDED FOR ANXIETY (SEDATION CAUTION) 30 tablet 2  . ipratropium (ATROVENT) 0.06 % nasal spray one spray per nostril twice daily (  up to three times daily) to help with runny nose 15 mL 5  . montelukast (SINGULAIR) 10 MG tablet TAKE ONE TABLET (10 MG) BY MOUTH AT BEDTIME 30 tablet 5  . polyethylene glycol powder (GLYCOLAX/MIRALAX) 17 GM/SCOOP powder Take 17 g by mouth daily as needed for mild constipation.     No current facility-administered medications on file prior to visit.    Allergies:   Allergies  Allergen Reactions  . Hydrocodone Nausea Only    Noted after surgery, may be able to tolerate with food      OBJECTIVE:  Physical Exam  Vitals:   09/12/20 0907  BP: (!) 142/86  Pulse: 76  Weight: 138 lb (62.6 kg)  Height: 5' (1.524 m)   Body mass index is 26.95 kg/m. No exam data present  GAD 7 : Generalized Anxiety Score 09/12/2020  Nervous, Anxious, on Edge 3  Control/stop worrying 3  Worry too much - different things 3  Trouble relaxing 3  Restless 3  Easily annoyed or irritable 1  Afraid - awful might happen 3  Total GAD 7 Score 19     General: well developed, well nourished,  pleasantly anxious elderly Caucasian female, seated, in no evident distress Neck: supple with no carotid or supraclavicular bruits Cardiovascular: regular rate and rhythm, no murmurs Vascular:  Normal pulses all extremities  Neurologic Exam Mental Status: Awake and fully alert.  Fluent speech and language.  Oriented to place and time. Recent and remote memory intact. Attention span, concentration and fund of knowledge mostly appropriate but would consistently speak of the same topics. Mood and affect anxious.  Cranial Nerves: Pupils equal, briskly reactive to light. Extraocular movements full without nystagmus. Visual fields full to confrontation. Hearing intact. Facial sensation intact.  Tongue and palate moves normally and symmetrically.  Mild left lower facial  weakness Motor: Normal bulk and tone. Normal strength in all tested extremity muscles Sensory.: intact to touch , pinprick , position and vibratory sensation.  Coordination: Rapid alternating movements normal in all extremities. Finger-to-nose and heel-to-shin performed accurately bilaterally.  Gait and Station: Stands from seated position without difficulty.  Stance is normal.  Gait demonstrates relatively normal stride length with slight unsteadiness and use of Rollator walker Reflexes: 1+ and symmetric. Toes downgoing.       ASSESSMENT/PLAN: [name] [name] is a 80 y.o. year old female with history of R MCA stroke felt secondary to small vessel disease on 07/11/2019, left frontal lobe infarct 02/16/2020 evaluated at [facility] regional felt secondary to large vessel disease vs embolic source, left ACA stroke 03/2020 likely secondary to progressive intracranial arthrosclerosis.  P2Y12 4 on Plavix.  2 additional ED evaluations 05/09/2020 and 05/13/2020 for worsening left-sided weakness and numbness without evidence of acute infarct contributing to symptoms and felt likely in setting of hypoperfusion with intracranial arthrosclerosis. Vascular risk factors include HTN, HLD, intracranial arthrosclerosis with stenosis, OSA on CPAP, EtOH use and obesity.      1. Recent worsening symptoms a. Occasional right leg weakness, worsening cognition with reported confusion and insomnia - eval in ED 3/12 post fall with MR brain negative for acute stroke b. High suspicion that intermittent right leg weakness is in setting of her known low back issue.  Unable to appreciate weakness on today's exam.  She is currently scheduled with [name] on 4/28 for nerve block injections.  She was encouraged to contact his office to verify that there is no need to hold Brilinta.  She was encouraged to use  her Rollator walker at all times for fall prevention was otherwise instructed c. Worsening cognition, reported confusion and  insomnia likely in setting of severe anxiety with GAD 7 score 19.  Currently on Lexapro per PCP with recent recommendation to increase to 15 mg. Advised her that I would reach out to her PCP Dr. Damita Dunnings to discuss further treatment options.  Limited benefit with use of hydroxyzine for insomnia -possibly consider low-dose trazodone until SSRI can take full effect.  Referral placed to behavioral health d. Very low suspicion that symptoms are in setting of new stroke has no focal deficits on exam and MRI obtained 3/15 (initial onset) with no acute abnormality noted.  She was reassured 2. Multiple reoccurring strokes:  a. No true new or reoccurring stroke/TIA symptoms b. Loop recorder placed 06/20/2020 by Dr. Rayann Heman to further monitoring for atrial fibrillation as potential stroke etiology which has not shown atrial fibrillation thus far c. Continue Brilinta and atorvastatin 80 mg daily for secondary stroke prevention -advise no indication to restart aspirin as risks would not outweigh benefit for stroke prevention d. Discussed secondary stroke prevention measures and importance of close PCP follow-up for aggressive stroke risk factor management e. HTN: BP goal 130-150 to ensure adequate perfusion.  Encouraged continued routine monitoring at home and follow-up with PCP for ongoing management f. HLD: LDL goal <70. Prior LDL 48 (03/2020) on atorvastatin 80 mg daily per PCP 3. OSA on CPAP:  a. Continued nightly use of CPAP followed by Dr. Rexene Alberts      CC:  [facility] provider: Dr. Oliver Hum, Elveria Rising, MD    I spent 50 minutes of face-to-face and non-face-to-face time with patient and daughter.  This included previsit chart review, lab review, study review, order entry, electronic health record documentation, patient and daughter prolonged discussion/education regarding recent worsening/new onset symptoms and likely etiology, history of recurrent strokes and review of loop recorder, importance of managing stroke  risk factors and answered all other questions to patient and daughters satisfaction  Frann Rider, [facility]  [facility] [address] [employer] [place], [place] [address]  Phone [phone] Fax [phone] Note: This document was prepared with digital dictation and possible smart phrase technology. Any transcriptional errors that result from this process are unintentional.

## 2020-09-12 NOTE — Telephone Encounter (Signed)
Notify pt.  Saw the note from neurology.  If she is hesitant to inc lexapro in the meantime, then f/u with behavioral health is reasonable.  Please update me as needed.  Referral already in EMR.

## 2020-09-13 MED ORDER — ESCITALOPRAM OXALATE 10 MG PO TABS: 10.0000 mg | ORAL_TABLET | Freq: Every morning | ORAL | Status: DC

## 2020-09-13 NOTE — Telephone Encounter (Signed)
Spoke with patient and she did increase the lexapro this morning as instructed by neurology. Patient states she was very dizzy, has had trouble with her balance and just could not walk right today. Daughter was so scared she was gonna fall. Patient does not want to take the increased dose again tomorrow. Patient will await Dr. Carole Civil input; patient is aware he is out of the office this week.

## 2020-09-13 NOTE — Addendum Note (Signed)
Addended by: Tonia Ghent on: 09/13/2020 08:45 PM   Modules accepted: Orders

## 2020-09-13 NOTE — Telephone Encounter (Signed)
Then I think it is reasonable to continue the lower dose of lexapro (10mg ) and f/u with behavioral health.  Thanks.

## 2020-09-14 NOTE — Telephone Encounter (Signed)
Talked to patient about lowering her dose back to what she was taking. Patient had already discussed this with her daughter and both her and the patient want her to continue on with the increased dose of lexapro. Patient took her higher dose this morning and is doing fine. Daughter told patient that she may have needed to get the higher dose in her system. I advised patient to call back if she feels worse again or feels like she needs to come back down on her dose and she agrees to do so.

## 2020-09-16 NOTE — Telephone Encounter (Signed)
Noted. Thanks.

## 2020-09-17 NOTE — Progress Notes (Signed)
I agree with the above plan 

## 2020-09-24 ENCOUNTER — Telehealth: Payer: Self-pay

## 2020-09-24 NOTE — Telephone Encounter (Signed)
I spoke with pts daughter (DPR signed) pts daughter did get refill this morning; pts daughter said for couple of wks pt's memory has worsened. pts daughter wanted appt with Dr Damita Dunnings. Scheduled appt with DR Damita Dunnings on 09/27/20 at 12 noon. pts daughter will monitor pt and if condition worsening will call office or go to ED. UC & ED precautions given and pts daughter voiced understanding. pts daughter will also encourage more fluids because pts daughter said pt is not drinking as much as usual. Sending note to DR Damita Dunnings.

## 2020-09-24 NOTE — Telephone Encounter (Signed)
Noted. Thanks.

## 2020-09-24 NOTE — Telephone Encounter (Signed)
[facility] Night - Client TELEPHONE ADVICE RECORD [facility] Patient Name: [name] [name] NKS Gender: Female DOB: 06/08/1940 Age: 80 Y 6 D Return Phone Number: [phone] (Primary) Address: City/ State/ Zip: [place] [address] Client [facility] Night - Client Client Site [place] Physician Renford Dills - MD Contact Type Call Who Is Calling Patient / Member / Family / Caregiver Call Type Triage / Clinical Caller Name [name] Relationship To Patient Daughter Return Phone Number [phone] (Primary) Chief Complaint Prescription Refill or Medication Request (non symptomatic) Reason for Call Symptomatic / Request for Josephine states her mother is out of her prescription and cannot wait until Monday. It is her blood thinner. She does not want her to have another stroke Translation No Nurse Assessment Nurse: Junius Creamer, RN, Debra Date/Time (Eastern Time): 09/22/2020 5:40:31 PM Confirm and document reason for call. If symptomatic, describe symptoms. ---Caller states her mother is out of rx blood thinner (brilenta 90mg  po bid) and can't wait until Monday. does not want her to have another stroke. [facility] [phone], was where rx was originally filled but it is closed until monday. only has enough for the am dose tomorrow. is not out of refills. no symptoms. Does the patient have any new or worsening symptoms? ---No Please document clinical information provided and list any resource used. ---advised unable to assist w/ this after hours. verbalizes understanding. Disp. Time Eilene Ghazi Time) Disposition Final User 09/22/2020 5:50:21 PM Clinical Call Yes Junius Creamer, RN, Hilda Blades

## 2020-09-27 ENCOUNTER — Encounter: Payer: Self-pay | Admitting: Family Medicine

## 2020-09-27 ENCOUNTER — Ambulatory Visit (INDEPENDENT_AMBULATORY_CARE_PROVIDER_SITE_OTHER): Payer: Medicare HMO

## 2020-09-27 ENCOUNTER — Other Ambulatory Visit: Payer: Self-pay

## 2020-09-27 ENCOUNTER — Ambulatory Visit (INDEPENDENT_AMBULATORY_CARE_PROVIDER_SITE_OTHER): Payer: Medicare HMO | Admitting: Family Medicine

## 2020-09-27 DIAGNOSIS — I639 Cerebral infarction, unspecified: Secondary | ICD-10-CM | POA: Diagnosis not present

## 2020-09-27 DIAGNOSIS — F411 Generalized anxiety disorder: Secondary | ICD-10-CM

## 2020-09-27 MED ORDER — ESCITALOPRAM OXALATE 10 MG PO TABS: 15.0000 mg | ORAL_TABLET | Freq: Every day | ORAL | Status: DC

## 2020-09-27 MED ORDER — MONTELUKAST SODIUM 10 MG PO TABS: ORAL_TABLET | ORAL | Status: DC

## 2020-09-27 MED ORDER — MELATONIN 3 MG PO TABS: 6.0000 mg | ORAL_TABLET | Freq: Every day | ORAL | Status: DC

## 2020-09-27 NOTE — Progress Notes (Signed)
This visit occurred during the SARS-CoV-2 public health emergency.  Safety protocols were in place, including screening questions prior to the visit, additional usage of staff PPE, and extensive cleaning of exam room while observing appropriate contact time as indicated for disinfecting solutions.  We talked about counseling vs psychiatry.   See below.    She had more trouble with memory in the last 2 months.  Her calendar/record keeping is more complicated in the last 2 months, likely reflecting more trouble with recall.  Hydroxyzine isn't helping much with anxiety.  Still on lexapro 15mg  a day.  She increased her lexapro earlier in March.   We talked about singular use, started 03/28/20-unclear if that contributed to any of the above.   Meds, vitals, and allergies reviewed.   ROS: Per HPI unless specifically indicated in ROS section   GEN: nad, alert and oriented HEENT: ncat NECK: supple w/o LA CV: rrr.  PULM: ctab, no inc wob ABD: soft, +bs EXT: no edema SKIN: Well-perfused.  35 minutes were devoted to patient care in this encounter (this includes time spent reviewing the patient's file/history, interviewing and examining the patient, counseling/reviewing plan with patient).

## 2020-09-27 NOTE — Patient Instructions (Addendum)
Try stopping singulair.   If you have trouble with allergies, try claritin but not claritin D.   Don't take hydroxyzine with claritin.   Take care.  Glad to see you.

## 2020-09-28 ENCOUNTER — Ambulatory Visit: Payer: Medicare HMO | Admitting: Family Medicine

## 2020-09-28 LAB — CUP PACEART REMOTE DEVICE CHECK
Date Time Interrogation Session: 20220506094826
Implantable Pulse Generator Implant Date: 20220126

## 2020-09-30 NOTE — Assessment & Plan Note (Signed)
Anxiety and memory loss noted.  Unclear if Singulair contributes to her current symptoms.  Discussed options.  She could try stopping singulair for short period of time and see if that makes a difference. If having trouble with allergies, try claritin but not claritin D.  Advised not to take hydroxyzine with claritin.   I think it makes sense for her to see psychiatry.  Staff is checking on her referral.

## 2020-10-01 ENCOUNTER — Telehealth: Payer: Self-pay | Admitting: Family Medicine

## 2020-10-01 DIAGNOSIS — F411 Generalized anxiety disorder: Secondary | ICD-10-CM

## 2020-10-01 NOTE — Telephone Encounter (Signed)
[name] called in due to [name] was referred to [facility] behavioral health for therapy but they only have counslors. And wanted to know if they can get a referral to see Dr. Clovis Pu and he is located at [address], [place], [place]: [phone]. Please call [name] when it is completed.   Please advise

## 2020-10-02 NOTE — Telephone Encounter (Signed)
Referral placed. Please update patient. Referral department will be working on it.  Thanks.

## 2020-10-02 NOTE — Telephone Encounter (Signed)
Called patient and daughter and made aware referral has been redone.

## 2020-10-11 ENCOUNTER — Other Ambulatory Visit: Payer: Self-pay

## 2020-10-11 ENCOUNTER — Ambulatory Visit (HOSPITAL_COMMUNITY)
Admission: EM | Admit: 2020-10-11 | Discharge: 2020-10-11 | Disposition: A | Discharge status: Home / Self Care | Payer: Medicare HMO | Attending: Student in an Organized Health Care Education/Training Program | Admitting: Student in an Organized Health Care Education/Training Program

## 2020-10-11 DIAGNOSIS — F411 Generalized anxiety disorder: Secondary | ICD-10-CM | POA: Insufficient documentation

## 2020-10-11 DIAGNOSIS — G479 Sleep disorder, unspecified: Secondary | ICD-10-CM | POA: Diagnosis not present

## 2020-10-11 DIAGNOSIS — R441 Visual hallucinations: Secondary | ICD-10-CM | POA: Insufficient documentation

## 2020-10-11 DIAGNOSIS — Z8673 Personal history of transient ischemic attack (TIA), and cerebral infarction without residual deficits: Secondary | ICD-10-CM | POA: Diagnosis not present

## 2020-10-11 MED ORDER — QUETIAPINE FUMARATE 25 MG PO TABS: 12.5000 mg | ORAL_TABLET | Freq: Every day | ORAL | 0 refills | Status: DC

## 2020-10-11 MED ORDER — MIRTAZAPINE 15 MG PO TBDP: 7.5000 mg | ORAL_TABLET | Freq: Every day | ORAL | 0 refills | Status: DC

## 2020-10-11 NOTE — ED Notes (Signed)
Pt discharged in no acute distress. Resources given. Safety maintained.

## 2020-10-11 NOTE — Discharge Instructions (Addendum)
Patient is instructed to take all prescribed medications as recommended.  - STOP Lexapro - Start Remeron 7.5 mg at night tom - Start Seroquel 12.5 mg tonight Report any side effects or adverse reactions to your outpatient psychiatrist. Patient is instructed to abstain from alcohol and illegal drugs while on prescription medications. In the event of worsening symptoms, patient is instructed to call the crisis hotline, 911, or go to the nearest emergency department for evaluation and treatment.

## 2020-10-11 NOTE — BH Assessment (Signed)
Pt to [facility] with her daughter due to increased anxiety, difficulty sleeping and memory issues. Pt states "I'm having difficulty getting things together". Pt reports visual hallucinations while trying to go to sleep. Pt unable to express what she sees however reports feeling like she is in a "dream like state". Pt denies SI, ,HI.  Pt is routine.

## 2020-10-11 NOTE — ED Provider Notes (Signed)
Behavioral Health Urgent Care Medical Screening Exam  Patient Name: [name] [name] MRN: [id number] Date of Evaluation: 10/11/20 Chief Complaint:   Anxiety Diagnosis:  Final diagnoses:  GAD (generalized anxiety disorder)    History of Present illness: [name] [name] is a 80 y.o. female w/ PMH of 3 CVAs and 1 psychiatric hospitalization with unknown dx in the 41s in [place].   On presentation today patient comes with her daughter as she reports that she has had worsening anxiety since her last stroke 03/2020. Patient also reports that she feels paranoid more and recalls having this similar feeling in the 70s when she hospitalized. Patient recalls that she was prescribed Lithium during the hospitalization but did not require this medication long term and does note that she had recently had her twins prior to the hospitalization. Patient reports that she also is having significant problems falling asleep and endorses racing thoughts and endorses nighttime visual hallucinations which she has been telling her daughter about. Patient reports that she was recently dx with GAD by her neurologist and her PCP has been treating with Lexapro with a recent increase in 08/2020 to 15mg . Patient reports that she has only felt worse on the Lexapro and notes that her anxiety can get so bad that she has diarrhea, racing thoughts, and constantly finds her self worry most of her day about small decisions and basic interactions with people. Patient and patient's daughter report that patient is normally very social but patient endorses having a hard time interacting with others due to her anxiety the past few months. Patient also reports a decreased appetite since her last stroke.  Patient denies SI, HI and AH. Patient reports that her PCP recommended a psychiatrist but it will be time before she can be seen.  Psychiatric Specialty Exam  Presentation  General Appearance:Well Groomed  Eye Contact:Good  Speech:Clear and  Coherent  Speech Volume:Normal  Handedness:No data recorded  Mood and Affect  Mood:Anxious  Affect:Congruent   Thought Process  Thought Processes:-- (Circumferential)  Descriptions of Associations:Intact  Orientation:Full (Time, Place and Person)  Thought Content:Logical    Hallucinations:-- (Visual hallucinations at night)  Ideas of Reference:Paranoia  Suicidal Thoughts:No  Homicidal Thoughts:No   Sensorium  Memory:Immediate Good; Recent Good; Remote Fair  Judgment:Fair  Insight:Fair   Executive Functions  Concentration:Poor  Attention Span:Fair  [place] of Knowledge:Good  Language:Good   Psychomotor Activity  Psychomotor Activity:Normal   Assets  Assets:Communication Skills; Desire for Improvement; Financial Resources/Insurance; Housing; Social Support; Leisure Time   Sleep  Sleep:Poor  Number of hours: No data recorded  No data recorded  Physical Exam: Physical Exam Constitutional:      Appearance: Normal appearance.  HENT:     Head: Normocephalic and atraumatic.     Nose: Nose normal.  Eyes:     Extraocular Movements: Extraocular movements intact.     Pupils: Pupils are equal, round, and reactive to light.  Cardiovascular:     Rate and Rhythm: Normal rate.     Pulses: Normal pulses.  Pulmonary:     Effort: Pulmonary effort is normal.  Musculoskeletal:        General: Normal range of motion.  Skin:    General: Skin is warm and dry.  Neurological:     General: No focal deficit present.     Mental Status: She is alert and oriented to person, place, and time.    Review of Systems  Constitutional: Negative for chills and fever.  HENT: Negative for  hearing loss.   Eyes: Negative for blurred vision.  Respiratory: Negative for cough and wheezing.   Cardiovascular: Negative for chest pain.  Gastrointestinal: Negative for abdominal pain.  Neurological: Negative for dizziness.  Psychiatric/Behavioral: Negative for  suicidal ideas.   Blood pressure (!) 142/78, pulse 71, temperature 98.1 F (36.7 C), temperature source Oral, resp. rate 16, height 4\' 11"  (1.499 m), weight 133 lb (60.3 kg), SpO2 99 %. Body mass index is 26.86 kg/m.  Musculoskeletal: Strength & Muscle Tone: within normal limits Gait & Station: normal Patient leans: N/A   [place] MSE Discharge Disposition for Follow up and Recommendations: Based on my evaluation the patient does not appear to have an emergency medical condition and can be discharged with resources and follow up care in outpatient services for Medication Management GAD Patient did well on MSE decreasing concern for dementia. However reported symptoms meet criteria for dx of GAD. - Recommend d'c Lexapro - Start Seroquel 12.5mg  QHS - Start Remeron 7.5mg  QHS Monitor patient via PCP and OP Psychiatrist.  Patient's daughter will get appt with Psychiatrist they were referred to by PCP. Daughter will stay with patient for 2 nights to monitor for any negative side effects.  PGY-1 Freida Busman, MD 10/11/2020, 1:09 PM

## 2020-10-17 NOTE — Progress Notes (Signed)
Carelink Summary Report / Loop Recorder 

## 2020-10-18 ENCOUNTER — Encounter (HOSPITAL_COMMUNITY): Payer: Self-pay | Admitting: Emergency Medicine

## 2020-10-18 ENCOUNTER — Other Ambulatory Visit: Payer: Self-pay

## 2020-10-18 ENCOUNTER — Emergency Department (HOSPITAL_COMMUNITY)
Admission: EM | Admit: 2020-10-18 | Discharge: 2020-10-18 | Disposition: A | Discharge status: Home / Self Care | Payer: Medicare HMO | Attending: Emergency Medicine | Admitting: Emergency Medicine

## 2020-10-18 DIAGNOSIS — Z96641 Presence of right artificial hip joint: Secondary | ICD-10-CM | POA: Insufficient documentation

## 2020-10-18 DIAGNOSIS — I10 Essential (primary) hypertension: Secondary | ICD-10-CM | POA: Insufficient documentation

## 2020-10-18 DIAGNOSIS — Z96652 Presence of left artificial knee joint: Secondary | ICD-10-CM | POA: Insufficient documentation

## 2020-10-18 DIAGNOSIS — G4700 Insomnia, unspecified: Secondary | ICD-10-CM | POA: Insufficient documentation

## 2020-10-18 DIAGNOSIS — H9313 Tinnitus, bilateral: Secondary | ICD-10-CM

## 2020-10-18 DIAGNOSIS — Z79899 Other long term (current) drug therapy: Secondary | ICD-10-CM | POA: Diagnosis not present

## 2020-10-18 MED ORDER — MELATONIN 3 MG PO TABS
3.0000 mg | ORAL_TABLET | ORAL | Status: AC
  Administered 2020-10-18: 3 mg via ORAL
  Filled 2020-10-18: qty 1

## 2020-10-18 NOTE — ED Provider Notes (Signed)
[facility] EMERGENCY DEPARTMENT Provider Note   CSN: [id number] Arrival date & time: 10/18/20  0104     History Chief Complaint  Patient presents with  . Insomnia    [name] [name] is a 80 y.o. female.  The history is provided by the patient.  Insomnia This is a recurrent problem. The current episode started more than 2 days ago. The problem occurs constantly. The problem has not changed since onset.Pertinent negatives include no chest pain, no abdominal pain, no headaches and no shortness of breath. Nothing aggravates the symptoms. Nothing relieves the symptoms. Treatments tried: remeron and seroquel. The treatment provided no relief.  Patient with anxiety and previous CVA who presents with insomnia.  Was started on Remeron and Seroquel for this and the anxiety and it hasn't helped.  She has not followed up with the doctor who placed her on these medications.  Also complains of ongoing tinnitus in B ears.       Past Medical History:  Diagnosis Date  . Arthritis    knees  . Depression   . Fatty liver   . GERD (gastroesophageal reflux disease)   . Hyperlipidemia   . Hypertension   . IBS (irritable bowel syndrome)   . Obesity   . Pneumonia   . Sleep apnea    uses CPAP  . Stroke (cerebrum) [facility])     Patient Active Problem List   Diagnosis Date Noted  . Shingles 08/30/2020  . Acute CVA (cerebrovascular accident) ([place]) 04/18/2020  . Class 1 obesity due to excess calories with body mass index (BMI) of 30.0 to 30.9 in adult 04/18/2020  . Seasonal and perennial allergic rhinitis 03/28/2020  . HLD (hyperlipidemia) 02/16/2020  . Stroke ([place]) 02/16/2020  . Fall 02/16/2020  . Abdominal wall hernia 10/30/2019  . Change in voice 10/30/2019  . History of stroke 09/14/2019  . Spinal stenosis 07/24/2019  . Mixed hyperlipidemia   . OSA (obstructive sleep apnea)   . Dysfunction of eustachian tube 05/15/2019  . Other social stressor 03/29/2019  . OA  (osteoarthritis) of hip 07/21/2018  . Diarrhea 06/20/2018  . Joint pain 06/20/2018  . 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 Medicare annual wellness visit, subsequent 09/15/2017  . SUI (stress urinary incontinence, female) 08/07/2017  . Back pain 06/16/2016  . Advance care planning 01/07/2016  . Sleep apnea   . 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 Allergic rhinitis 11/24/2014  . Adult BMI 30+ 11/24/2014  . Depression 11/24/2014  . Insomnia 08/23/2009  . Headache, migraine 05/10/2009  . HERPES SIMPLEX INFECTION 08/14/2006  . HYPERCHOLESTEROLEMIA 08/14/2006  . Anxiety state 08/14/2006  . Essential hypertension 08/14/2006  . HIATAL HERNIA 08/14/2006  . IRRITABLE BOWEL SYNDROME 08/14/2006  . 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 Primary osteoarthritis of right knee 08/14/2006  . PLANTAR FASCIITIS 08/14/2006    Past Surgical History:  Procedure Laterality Date  . BLADDER SURGERY    . BREAST SURGERY     breast biopsy  . BROW LIFT Bilateral 04/14/2017   Procedure: BLEPHAROPLASTY UPPER EYELID WITH EXCESS SKIN;  Surgeon: Karle Starch, MD;  Location: [facility];  Service: Ophthalmology;  Laterality: Bilateral;  . CATARACT EXTRACTION W/PHACO Left 02/05/2016   Procedure: CATARACT EXTRACTION PHACO AND INTRAOCULAR LENS PLACEMENT (IOC);  Surgeon: Birder Robson, MD;  Location: [facility];  Service: Ophthalmology;  Laterality: Left;  [place] 01:10 AP% 22.3 CDE 15.67 Fluid pack lot # [id number]  . CATARACT EXTRACTION W/PHACO  Right 02/26/2016   Procedure: CATARACT EXTRACTION PHACO AND INTRAOCULAR LENS PLACEMENT (IOC);  Surgeon: Birder Robson, MD;  Location: [facility];  Service: Ophthalmology;  Laterality: Right;  [place] 57.4 AP% 24.0 CDE 13.75 Fluid Pack lot # [id number]  . CHOLECYSTECTOMY    . COLONOSCOPY WITH PROPOFOL N/A 12/18/2014   Procedure: COLONOSCOPY WITH PROPOFOL;  Surgeon: Manya Silvas, MD;  Location: [facility] ENDOSCOPY;  Service:  Endoscopy;  Laterality: N/A;  . DEEP NECK LYMPH NODE BIOPSY / EXCISION    . implantable loop recorder implantation  06/20/2020   Medtronic Reveal Linq model LNQ 22 ([place] [id number]) implantable loop recorder for cryptogenic stroke  . JOINT REPLACEMENT    . KNEE ARTHROPLASTY Left 06/20/2015   Procedure: COMPUTER ASSISTED TOTAL KNEE ARTHROPLASTY;  Surgeon: Dereck Leep, MD;  Location: [facility];  Service: Orthopedics;  Laterality: Left;  . PTOSIS REPAIR Bilateral 04/14/2017   Procedure: PTOSIS REPAIR RESECT EX;  Surgeon: Karle Starch, MD;  Location: [facility];  Service: Ophthalmology;  Laterality: Bilateral;  sleep apnea  . TONSILLECTOMY    . TOTAL HIP ARTHROPLASTY Right 07/21/2018   Procedure: TOTAL HIP ARTHROPLASTY ANTERIOR APPROACH;  Surgeon: Gaynelle Arabian, MD;  [facility];  Service: Orthopedics;  Laterality: Right;     OB History   No obstetric history on file.     Family History  Problem Relation Age of Onset  . Heart attack Mother   . Dementia Father   . Heart attack Father 55  . Aortic aneurysm Brother   . Stroke Paternal Grandfather   . Colon cancer Neg Hx   . Breast cancer Neg Hx     Social History   Tobacco Use  . Smoking status: Never Smoker  . Smokeless tobacco: Never Used  Vaping Use  . Vaping Use: Never used  Substance Use Topics  . Alcohol use: Yes    Comment: ocassional glass of wine 2-3 times a year  . Drug use: No    Home Medications Prior to Admission medications   Medication Sig Start Date End Date Taking? Authorizing Provider  acetaminophen (TYLENOL) 500 MG tablet Take 500 mg by mouth every 6 (six) hours as needed for moderate pain.     [provider]  atenolol (TENORMIN) 25 MG tablet Take 1 tablet (25 mg total) by mouth 2 (two) times daily. 05/15/20   Tonia Ghent, MD  atorvastatin (LIPITOR) 80 MG tablet TAKE ONE TABLET BY MOUTH EVERY DAY AT 6PM 05/23/20   Tonia Ghent, MD  BRILINTA 90 MG TABS tablet TAKE ONE TABLET  TWICE DAILY 08/13/20   Tonia Ghent, MD  cholecalciferol (VITAMIN D) 1000 UNITS tablet Take 1,000 Units by mouth daily.     [provider]  hydrOXYzine (ATARAX/VISTARIL) 10 MG tablet TAKE 1/2-1 TABLET BY MOUTH 3 TIMES DAILYAS NEEDED FOR ANXIETY (SEDATION CAUTION) 09/10/20   Tonia Ghent, MD  ipratropium (ATROVENT) 0.06 % nasal spray one spray per nostril twice daily (up to three times daily) to help with runny nose 03/28/20   Valentina Shaggy, MD  loratadine (CLARITIN) 10 MG tablet Take 10 mg by mouth daily as needed for allergies.    [provider]  mirtazapine (REMERON SOL-TAB) 15 MG disintegrating tablet Take 0.5 tablets (7.5 mg total) by mouth at bedtime. 10/11/20 11/10/20  Freida Busman, MD  montelukast (SINGULAIR) 10 MG tablet Stop as of 09/27/20 09/27/20   Tonia Ghent, MD  polyethylene glycol powder [facility]) 17 GM/SCOOP powder Take  17 g by mouth daily as needed for mild constipation. 05/15/20   Tonia Ghent, MD  QUEtiapine (SEROQUEL) 25 MG tablet Take 0.5 tablets (12.5 mg total) by mouth at bedtime. 10/11/20   Freida Busman, MD    Allergies    Hydrocodone  Review of Systems   Review of Systems  Constitutional: Negative for fever.  HENT: Positive for tinnitus.   Eyes: Negative for photophobia and visual disturbance.  Respiratory: Negative for shortness of breath.   Cardiovascular: Negative for chest pain.  Gastrointestinal: Negative for abdominal pain.  Genitourinary: Negative for difficulty urinating.  Musculoskeletal: Negative for neck pain and neck stiffness.  Skin: Negative for rash.  Neurological: Negative for dizziness, facial asymmetry and headaches.  Psychiatric/Behavioral: Negative for agitation. The patient has insomnia.   All other systems reviewed and are negative.   Physical Exam Updated Vital Signs BP (!) 146/83   Pulse 72   Temp 98 F (36.7 C) (Oral)   Resp 18   Ht 4\' 11"  (1.499 m)   Wt 60.3 kg   SpO2 99%    BMI 26.85 kg/m   Physical Exam Vitals and nursing note reviewed.  Constitutional:      General: She is not in acute distress.    Appearance: Normal appearance.  HENT:     Head: Normocephalic and atraumatic.     Right Ear: Tympanic membrane, ear canal and external ear normal.     Left Ear: Tympanic membrane, ear canal and external ear normal.     Nose: Nose normal.     Mouth/Throat:     Mouth: Mucous membranes are moist.     Pharynx: Oropharynx is clear.  Eyes:     Extraocular Movements: Extraocular movements intact.     Conjunctiva/sclera: Conjunctivae normal.     Pupils: Pupils are equal, round, and reactive to light.  Cardiovascular:     Rate and Rhythm: Normal rate and regular rhythm.     Pulses: Normal pulses.     Heart sounds: Normal heart sounds.  Pulmonary:     Effort: Pulmonary effort is normal.     Breath sounds: Normal breath sounds.  Abdominal:     General: Abdomen is flat. Bowel sounds are normal.     Palpations: Abdomen is soft.     Tenderness: There is no abdominal tenderness. There is no guarding.  Musculoskeletal:        General: Normal range of motion.     Cervical back: Normal range of motion and neck supple.  Skin:    General: Skin is warm and dry.     Capillary Refill: Capillary refill takes less than 2 seconds.  Neurological:     General: No focal deficit present.     Mental Status: She is alert and oriented to person, place, and time.     Deep Tendon Reflexes: Reflexes normal.  Psychiatric:        Mood and Affect: Mood is anxious.     ED Results / Procedures / Treatments   Labs (all labs ordered are listed, but only abnormal results are displayed) Labs Reviewed - No data to display  EKG None  Radiology No results found.  Procedures Procedures   Medications Ordered in ED Medications  melatonin tablet 3 mg (has no administration in time range)    ED Course  I have reviewed the triage vital signs and the nursing notes.  Pertinent  labs & imaging results that were available during my care of the patient were  reviewed by me and considered in my medical decision making (see chart for details).    Tinnitus is ongoing.  No signs of infection.  Will refer to ENT.  I have given one dose of melatonin and advised follow up with both PMD and doctor who prescribed the seroquel and remeron.  I will not add anything to these 2 strong medications at this time.  I have counseled the patient on sleep hygiene.  I see no acute emergency condition at this time.  SUDIE BANDEL was evaluated in Emergency Department on 10/18/2020 for the symptoms described in the history of present illness. She was evaluated in the context of the global COVID-19 pandemic, which necessitated consideration that the patient might be at risk for infection with the SARS-CoV-2 virus that causes COVID-19. Institutional protocols and algorithms that pertain to the evaluation of patients at risk for COVID-19 are in a state of rapid change based on information released by regulatory bodies including the CDC and federal and state organizations. These policies and algorithms were followed during the patient's care in the ED.  Final Clinical Impression(s) / ED Diagnoses Return for intractable cough, coughing up blood, fevers >100.4 unrelieved by medication, shortness of breath, intractable vomiting, chest pain, shortness of breath, weakness, numbness, changes in speech, facial asymmetry, abdominal pain, passing out, Inability to tolerate liquids or food, cough, altered mental status or any concerns. No signs of systemic illness or infection. The patient is nontoxic-appearing on exam and vital signs are within normal limits.  I have reviewed the triage vital signs and the nursing notes. Pertinent labs & imaging results that were available during my care of the patient were reviewed by me and considered in my medical decision making (see chart for details). After history, exam, and  medical workup I feel the patient has been appropriately medically screened and is safe for discharge home. Pertinent diagnoses were discussed with the patient. Patient was given return precautions.     Romie Tay, MD 10/18/20 [phone]

## 2020-10-18 NOTE — ED Triage Notes (Signed)
Pt reports no sleep for three nights/days.  Denies any other complaints but has had recent medication change.

## 2020-10-19 ENCOUNTER — Other Ambulatory Visit: Payer: Self-pay

## 2020-10-19 ENCOUNTER — Ambulatory Visit (INDEPENDENT_AMBULATORY_CARE_PROVIDER_SITE_OTHER): Payer: Medicare HMO | Admitting: Behavioral Health

## 2020-10-19 ENCOUNTER — Encounter: Payer: Self-pay | Admitting: Behavioral Health

## 2020-10-19 VITALS — BP 150/83 | HR 66 | Ht 59.0 in | Wt 132.0 lb

## 2020-10-19 DIAGNOSIS — R630 Anorexia: Secondary | ICD-10-CM | POA: Diagnosis not present

## 2020-10-19 DIAGNOSIS — G4700 Insomnia, unspecified: Secondary | ICD-10-CM | POA: Diagnosis not present

## 2020-10-19 DIAGNOSIS — R4189 Other symptoms and signs involving cognitive functions and awareness: Secondary | ICD-10-CM

## 2020-10-19 DIAGNOSIS — F411 Generalized anxiety disorder: Secondary | ICD-10-CM | POA: Diagnosis not present

## 2020-10-19 DIAGNOSIS — F331 Major depressive disorder, recurrent, moderate: Secondary | ICD-10-CM | POA: Diagnosis not present

## 2020-10-19 MED ORDER — MIRTAZAPINE 15 MG PO TBDP: 15.0000 mg | ORAL_TABLET | Freq: Every day | ORAL | 1 refills | Status: DC

## 2020-10-19 MED ORDER — QUETIAPINE FUMARATE 25 MG PO TABS: 25.0000 mg | ORAL_TABLET | Freq: Every day | ORAL | 1 refills | Status: DC

## 2020-10-19 MED ORDER — SERTRALINE HCL 50 MG PO TABS: ORAL_TABLET | ORAL | 0 refills | Status: DC

## 2020-10-19 NOTE — Progress Notes (Signed)
[facility] MD/PA/NP Initial Note  10/19/2020 1:30 PM [name] [name]  MRN:  [id number]  Chief Complaint:  Chief Complaint    Anxiety; Depression; Stress; Medication Problem; Establish Care; Altered Mental Status; Insomnia      HPI:  80 year old female presents to this office for initial visit and to establish care. She is pleasant and calm with daughter [name] [name] present with verbal consent. Daughter says that her mother had recent ED visit on 10/11/2020 for worsening anxiety. 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 Today patient provides history going back to the 1970's when she reports first episode of "nervous breakdown". She is hard to focus with direct questioning and has flight of ideas as she progresses through conversation with some loose association. Daughter presents a calendar where Pt has been keeping daily reminders. She was concerned with the [name] of exquisite detail and excessiveness notes were taken. Patient endorses some paranoia and  says that she feels like her neighbors judge her because of her condition and that she is not good enough to fit in there. She ask if she she should continue going to church because the people there do not really know her. She is very talkative and often interrupts to interject new ideas, past history or explanations for her condition. Patient expresses concern over her daughters ability to continue extensive care and attention. Daughter says she noticed Remeron and Seroquel helping with some of the symptoms for a few nights and then it seemed to stop working. Mother or daughter were unable to provide history of past failed psychiatric medications at this time. Daughter says that she needs medication to help with sleep and provide calming effect for anxiety. She said ER doctor stopped the Lexapro which patient was on for about 6 months, but seemed to stop working. She says the patient was sleeping through the night but now experiencing broken sleep.  Reports food does not taste good and decrease in appetite. No mania, endorses racing thoughts and trouble concentrating. No current psychosis but reports past night time visual hallucinations. No SI/HI.  Patient could not provide list of past psychiatric medication failures  Visit Diagnosis:    ICD-10-CM   1. Generalized anxiety disorder  F41.1 QUEtiapine (SEROQUEL) 25 MG tablet    sertraline (ZOLOFT) 50 MG tablet  2. Major depressive disorder, recurrent episode, moderate (HCC)  F33.1 QUEtiapine (SEROQUEL) 25 MG tablet    sertraline (ZOLOFT) 50 MG tablet  3. Cognitive decline  R41.89 QUEtiapine (SEROQUEL) 25 MG tablet  4. Insomnia, unspecified type  G47.00 QUEtiapine (SEROQUEL) 25 MG tablet    mirtazapine (REMERON SOL-TAB) 15 MG disintegrating tablet  5. Lack of appetite  R63.0 mirtazapine (REMERON SOL-TAB) 15 MG disintegrating tablet    Past Psychiatric History: Recent ER visit   Past Medical History:  Past Medical History:  Diagnosis Date  . Arthritis    knees  . Depression   . Fatty liver   . GERD (gastroesophageal reflux disease)   . Hyperlipidemia   . Hypertension   . IBS (irritable bowel syndrome)   . Obesity   . Pneumonia   . Sleep apnea    uses CPAP  . Stroke (cerebrum) [facility])     Past Surgical History:  Procedure Laterality Date  . BLADDER SURGERY    . BREAST SURGERY     breast biopsy  . BROW LIFT Bilateral 04/14/2017   Procedure: BLEPHAROPLASTY UPPER EYELID WITH EXCESS SKIN;  Surgeon: Karle Starch, MD;  [facility];  Service: Ophthalmology;  Laterality: Bilateral;  . CATARACT EXTRACTION W/PHACO Left 02/05/2016   Procedure: CATARACT EXTRACTION PHACO AND INTRAOCULAR LENS PLACEMENT (IOC);  Surgeon: Birder Robson, MD;  Location: [facility];  Service: Ophthalmology;  Laterality: Left;  [place] 01:10 AP% 22.3 CDE 15.67 Fluid pack lot # [id number]  . CATARACT EXTRACTION W/PHACO Right 02/26/2016   Procedure: CATARACT EXTRACTION PHACO AND INTRAOCULAR LENS  PLACEMENT (IOC);  Surgeon: Birder Robson, MD;  Location: [facility];  Service: Ophthalmology;  Laterality: Right;  [place] 57.4 AP% 24.0 CDE 13.75 Fluid Pack lot # [id number]  . CHOLECYSTECTOMY    . COLONOSCOPY WITH PROPOFOL N/A 12/18/2014   Procedure: COLONOSCOPY WITH PROPOFOL;  Surgeon: Manya Silvas, MD;  Location: [facility] ENDOSCOPY;  Service: Endoscopy;  Laterality: N/A;  . DEEP NECK LYMPH NODE BIOPSY / EXCISION    . implantable loop recorder implantation  06/20/2020   Medtronic Reveal Linq model LNQ 22 ([place] [id number]) implantable loop recorder for cryptogenic stroke  . JOINT REPLACEMENT    . KNEE ARTHROPLASTY Left 06/20/2015   Procedure: COMPUTER ASSISTED TOTAL KNEE ARTHROPLASTY;  Surgeon: Dereck Leep, MD;  Location: [facility];  Service: Orthopedics;  Laterality: Left;  . PTOSIS REPAIR Bilateral 04/14/2017   Procedure: PTOSIS REPAIR RESECT EX;  Surgeon: Karle Starch, MD;  Location: [facility];  Service: Ophthalmology;  Laterality: Bilateral;  sleep apnea  . TONSILLECTOMY    . TOTAL HIP ARTHROPLASTY Right 07/21/2018   Procedure: TOTAL HIP ARTHROPLASTY ANTERIOR APPROACH;  Surgeon: Gaynelle Arabian, MD;  [facility];  Service: Orthopedics;  Laterality: Right;    Family Psychiatric History:see chart  Family History:  Family History  Problem Relation Age of Onset  . Heart attack Mother   . Dementia Father   . Heart attack Father 62  . Aortic aneurysm Brother   . Stroke Paternal Grandfather   . Colon cancer Neg Hx   . Breast cancer Neg Hx     Social History:  Social History   Socioeconomic History  . Marital status: Widowed    Spouse name: Not on file  . Number of children: Not on file  . Years of education: Not on file  . Highest education [name]: Not on file  Occupational History  . Occupation: retired  Tobacco Use  . Smoking status: Never Smoker  . Smokeless tobacco: Never Used  Vaping Use  . Vaping Use: Never used  Substance and Sexual Activity  . Alcohol  use: Yes    Comment: ocassional glass of wine 2-3 times a year  . Drug use: No  . Sexual activity: Never  Other Topics Concern  . 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 Hydrocodone Nausea Only    Noted after surgery, may be able to tolerate with food    Metabolic Disorder Labs: Lab Results  Component Value Date   HGBA1C 5.3 04/19/2020   MPG 105.41 04/19/2020   MPG 114.02 02/17/2020   No results found for: PROLACTIN Lab Results  Component  Value Date   CHOL 95 04/19/2020   TRIG 103 04/19/2020   HDL 26 (L) 04/19/2020   CHOLHDL 3.7 04/19/2020   VLDL 21 04/19/2020   LDLCALC 48 04/19/2020   LDLCALC 54 02/17/2020   Lab Results  Component Value Date   TSH 1.235 04/19/2020   TSH 1.480 05/31/2015    Therapeutic [name] Labs: No results found for: LITHIUM No results found for: VALPROATE No components found for:  CBMZ  Current Medications: Current Outpatient Medications  Medication Sig Dispense Refill  . acetaminophen (TYLENOL) 500 MG tablet Take 500 mg by mouth every 6 (six) hours as needed for moderate pain.     [name] Kitchen atenolol (TENORMIN) 25 MG tablet Take 1 tablet (25 mg total) by mouth 2 (two) times daily.    [name] Kitchen atorvastatin (LIPITOR) 80 MG tablet TAKE ONE TABLET BY MOUTH EVERY DAY AT 6PM 30 tablet 5  . BRILINTA 90 MG TABS tablet TAKE ONE TABLET TWICE DAILY 60 tablet 2  . cholecalciferol (VITAMIN D) 1000 UNITS tablet Take 1,000 Units by mouth daily.     [name] Kitchen ipratropium (ATROVENT) 0.06 % nasal spray one spray per nostril twice daily (up to three times daily) to help with  runny nose 15 mL 5  . polyethylene glycol powder (GLYCOLAX/MIRALAX) 17 GM/SCOOP powder Take 17 g by mouth daily as needed for mild constipation.    . sertraline (ZOLOFT) 50 MG tablet Take one half table 25 mg for 7 days, then one whole tablet. 30 tablet 0  . hydrOXYzine (ATARAX/VISTARIL) 10 MG tablet TAKE 1/2-1 TABLET BY MOUTH 3 TIMES DAILYAS NEEDED FOR ANXIETY (SEDATION CAUTION) (Patient not taking: Reported on 10/19/2020) 30 tablet 2  . loratadine (CLARITIN) 10 MG tablet Take 10 mg by mouth daily as needed for allergies. (Patient not taking: Reported on 10/19/2020)    . mirtazapine (REMERON SOL-TAB) 15 MG disintegrating tablet Take 1 tablet (15 mg total) by mouth at bedtime. 30 tablet 1  . montelukast (SINGULAIR) 10 MG tablet Stop as of 09/27/20 (Patient not taking: Reported on 10/19/2020)    . QUEtiapine (SEROQUEL) 25 MG tablet Take 1 tablet (25 mg total) by mouth at bedtime. 30 tablet 1   No current facility-administered medications for this visit.    Medication Side Effects: see chart  Orders placed this visit:  No orders of the defined types were placed in this encounter.   Psychiatric Specialty Exam:  Review of Systems  Constitutional: Positive for activity change, appetite change and diaphoresis.  HENT: Positive for tinnitus.   Gastrointestinal: Positive for constipation and diarrhea.  Endocrine: Positive for polydipsia.  Musculoskeletal: Positive for back pain and joint swelling.  Allergic/Immunologic: Positive for environmental allergies.  Neurological: Positive for speech difficulty and weakness.    Blood pressure (!) 150/83, pulse 66, weight 132 lb (59.9 kg).Body mass index is 26.66 kg/m.  General Appearance: Neat and Well Groomed  Eye Contact:  Fair  Speech:  Clear and Coherent  Volume:  Normal  Mood:  Anxious, Depressed and Dysphoric  Affect:  Non-Congruent  Thought Process:  Descriptions of Associations: Loose  Orientation:  Full (Time, Place, and Person)  Thought  Content: Ideas of Reference:   Paranoia and Paranoid Ideation   Suicidal Thoughts:  No  Homicidal Thoughts:  No  Memory:  Remote;   Fair  Judgement:  Fair  Insight:  Fair  Psychomotor Activity:  Decreased  Concentration:  Concentration: Fair  Recall:  [employer] of Knowledge: Fair  Language: Good  Assets:  Desire  for Improvement Resilience  ADL's:  Intact  Cognition: Impaired,  Mild  Prognosis:  Fair   Screenings:  GAD-7   Flowsheet Row Office Visit from 09/12/2020 in [facility]  Total GAD-7 Score 19    Mini-Mental   [place] from 08/17/2017 in [facility] from 08/14/2016 in [facility] at [facility]  Total Score (max 30 points ) 20 20    PHQ2-9   Flowsheet Row Patient Outreach Telephone from 07/26/2019 in [facility] Visit from 12/16/2018 in [facility] [place] from 08/17/2017 in [place] at [facility] Visit from 08/21/2016 in [facility] from 08/14/2016 in [place] [facility]  PHQ-2 Total Score 1 0 0 0 0  PHQ-9 Total Score -- -- 0 -- --    Flowsheet Row ED from 10/18/2020 in [facility] No Risk      Receiving Psychotherapy: No   Treatment Plan/Recommendations:  Patient to increase Remeron to 15 mg tablet at bedtime To increase Seroquel to 25 mg at bedtime To start Zoloft 1/2 tablet 25 mg for 7 days, then one whole tablet 50 mg. Will report worsening symptoms, acute further decline in cognition or behaviors. Provided after hours emergency contact information Discussed potential metabolic side effects associated with atypical antipsychotics, as well as potential risk for movement side effects. Advised pt to contact office if movement side effects occur.   Greater than 50% of 45 min. face to face time with  patient was spent on counseling and coordination of care. We discussed importance of following up with neurology to identify deficits or precursors to dementia and to address medications related to dementia. According to recent imaging Pt is positive for chronic small vessel disease which lead to demential  in some patients or present with symptoms of cognitive decline. Pt was advised that therapy would include treating anxiety, depression, psychosis, and sleep as needed. Pt agreed to follow up with neurology for physiological factors that could be causative for acute abnormal behaviors and decline.     Elwanda [place], NP

## 2020-10-23 ENCOUNTER — Telehealth: Payer: Self-pay | Admitting: Behavioral Health

## 2020-10-23 ENCOUNTER — Other Ambulatory Visit: Payer: Self-pay | Admitting: Family Medicine

## 2020-10-23 NOTE — Telephone Encounter (Signed)
Please review

## 2020-10-23 NOTE — Telephone Encounter (Signed)
I Contacted the daughter [name] [name] and explained in detail vascular dementia. She said mother is sleeping well and not remembering nightmares. She has decided to wait  and stay on current medication regimen and see if situation improves. No other action is needed at this time.

## 2020-10-23 NOTE — Telephone Encounter (Signed)
Please advise her that that it takes 4-6 weeks for medications to work. This is not considered medically serious side effects and need to give medication time to work before ruling it out.  Some of these may improve over time.  One week is too soon. Please advise her too follow up with neurology also due to her moms decline related to vascular changes indicated by her imaging.

## 2020-10-23 NOTE — Telephone Encounter (Signed)
Daughter called and said that [name] [name] was in last Friday. Since then she has increased the remeron and and seroquel. She has also added the zoloft. However, she is having nightmares and is more depressed. The daughter wants to know is this normal and they need to wait it out or does meds need to be changed. Please call the daughter at [phone]

## 2020-10-23 NOTE — Telephone Encounter (Signed)
Her daughter mentioned she recently stopped taking lexapro and wonders if this is contributing to the side effects

## 2020-10-26 ENCOUNTER — Other Ambulatory Visit: Payer: Self-pay

## 2020-10-26 ENCOUNTER — Ambulatory Visit (INDEPENDENT_AMBULATORY_CARE_PROVIDER_SITE_OTHER)
Admission: EM | Admit: 2020-10-26 | Discharge: 2020-10-26 | Disposition: A | Discharge status: Other Acute Inpatient Hospital | Payer: Medicare HMO | Source: Home / Self Care

## 2020-10-26 ENCOUNTER — Encounter (HOSPITAL_COMMUNITY): Payer: Self-pay | Admitting: Student

## 2020-10-26 ENCOUNTER — Emergency Department (HOSPITAL_COMMUNITY)
Admission: EM | Admit: 2020-10-26 | Discharge: 2020-10-28 | Disposition: A | Discharge status: Home / Self Care | Payer: Medicare HMO | Attending: Emergency Medicine | Admitting: Emergency Medicine

## 2020-10-26 ENCOUNTER — Encounter (HOSPITAL_COMMUNITY): Payer: Self-pay | Admitting: Emergency Medicine

## 2020-10-26 ENCOUNTER — Telehealth (HOSPITAL_COMMUNITY): Payer: Self-pay | Admitting: Emergency Medicine

## 2020-10-26 DIAGNOSIS — E785 Hyperlipidemia, unspecified: Secondary | ICD-10-CM | POA: Diagnosis not present

## 2020-10-26 DIAGNOSIS — F419 Anxiety disorder, unspecified: Secondary | ICD-10-CM

## 2020-10-26 DIAGNOSIS — F331 Major depressive disorder, recurrent, moderate: Secondary | ICD-10-CM

## 2020-10-26 DIAGNOSIS — Z79899 Other long term (current) drug therapy: Secondary | ICD-10-CM | POA: Insufficient documentation

## 2020-10-26 DIAGNOSIS — K760 Fatty (change of) liver, not elsewhere classified: Secondary | ICD-10-CM | POA: Insufficient documentation

## 2020-10-26 DIAGNOSIS — G4700 Insomnia, unspecified: Secondary | ICD-10-CM

## 2020-10-26 DIAGNOSIS — F22 Delusional disorders: Secondary | ICD-10-CM | POA: Insufficient documentation

## 2020-10-26 DIAGNOSIS — F411 Generalized anxiety disorder: Secondary | ICD-10-CM

## 2020-10-26 DIAGNOSIS — Z8673 Personal history of transient ischemic attack (TIA), and cerebral infarction without residual deficits: Secondary | ICD-10-CM | POA: Diagnosis not present

## 2020-10-26 DIAGNOSIS — Z96652 Presence of left artificial knee joint: Secondary | ICD-10-CM | POA: Insufficient documentation

## 2020-10-26 DIAGNOSIS — I10 Essential (primary) hypertension: Secondary | ICD-10-CM | POA: Diagnosis not present

## 2020-10-26 DIAGNOSIS — R45851 Suicidal ideations: Secondary | ICD-10-CM

## 2020-10-26 DIAGNOSIS — Z96641 Presence of right artificial hip joint: Secondary | ICD-10-CM | POA: Insufficient documentation

## 2020-10-26 DIAGNOSIS — F4323 Adjustment disorder with mixed anxiety and depressed mood: Secondary | ICD-10-CM | POA: Diagnosis not present

## 2020-10-26 LAB — COMPREHENSIVE METABOLIC PANEL
ALT: 21 U/L (ref 0–44)
AST: 25 U/L (ref 15–41)
Albumin: 4.3 g/dL (ref 3.5–5.0)
Alkaline Phosphatase: 146 U/L — ABNORMAL HIGH (ref 38–126)
Anion gap: 11 (ref 5–15)
BUN: 15 mg/dL (ref 8–23)
CO2: 25 mmol/L (ref 22–32)
Calcium: 10 mg/dL (ref 8.9–10.3)
Chloride: 98 mmol/L (ref 98–111)
Creatinine, Ser: 0.89 mg/dL (ref 0.44–1.00)
GFR, Estimated: >60 mL/min (ref >60)
Glucose, Bld: 109 mg/dL — ABNORMAL HIGH (ref 70–99)
Potassium: 4.3 mmol/L (ref 3.5–5.1)
Sodium: 134 mmol/L — ABNORMAL LOW (ref 135–145)
Total Bilirubin: 1.1 mg/dL (ref 0.3–1.2)
Total Protein: 7.1 g/dL (ref 6.5–8.1)

## 2020-10-26 LAB — RAPID URINE DRUG SCREEN, HOSP PERFORMED
Amphetamines: NOT DETECTED
Barbiturates: NOT DETECTED
Benzodiazepines: NOT DETECTED
Cocaine: NOT DETECTED
Opiates: NOT DETECTED
Tetrahydrocannabinol: NOT DETECTED

## 2020-10-26 LAB — CBC WITH DIFFERENTIAL/PLATELET
Abs Immature Granulocytes: 0.06 10*3/uL (ref 0.00–0.07)
Basophils Absolute: 0.1 10*3/uL (ref 0.0–0.1)
Basophils Relative: 1 %
Eosinophils Absolute: 0.2 10*3/uL (ref 0.0–0.5)
Eosinophils Relative: 2 %
HCT: 48.8 % — ABNORMAL HIGH (ref 36.0–46.0)
Hemoglobin: 16.1 g/dL — ABNORMAL HIGH (ref 12.0–15.0)
Immature Granulocytes: 1 %
Lymphocytes Relative: 20 %
Lymphs Abs: 1.9 10*3/uL (ref 0.7–4.0)
MCH: 31.5 pg (ref 26.0–34.0)
MCHC: 33 g/dL (ref 30.0–36.0)
MCV: 95.5 fL (ref 80.0–100.0)
Monocytes Absolute: 0.8 10*3/uL (ref 0.1–1.0)
Monocytes Relative: 8 %
Neutro Abs: 6.6 10*3/uL (ref 1.7–7.7)
Neutrophils Relative %: 68 %
Platelets: 305 10*3/uL (ref 150–400)
RBC: 5.11 MIL/uL (ref 3.87–5.11)
RDW: 14 % (ref 11.5–15.5)
WBC: 9.6 10*3/uL (ref 4.0–10.5)
nRBC: 0 % (ref 0.0–0.2)

## 2020-10-26 LAB — ETHANOL: Alcohol, Ethyl (B): <10 mg/dL (ref <10)

## 2020-10-26 LAB — URINALYSIS, ROUTINE W REFLEX MICROSCOPIC
Bilirubin Urine: NEGATIVE
Glucose, UA: NEGATIVE mg/dL
Ketones, ur: NEGATIVE mg/dL
Nitrite: NEGATIVE
Protein, ur: NEGATIVE mg/dL
Specific Gravity, Urine: 1.008 (ref 1.005–1.030)
pH: 5 (ref 5.0–8.0)

## 2020-10-26 LAB — SALICYLATE LEVEL: Salicylate Lvl: <7 mg/dL — ABNORMAL LOW (ref 7.0–30.0)

## 2020-10-26 LAB — ACETAMINOPHEN LEVEL: Acetaminophen (Tylenol), Serum: <10 ug/mL — ABNORMAL LOW (ref 10–30)

## 2020-10-26 MED ORDER — QUETIAPINE FUMARATE 25 MG PO TABS
25.0000 mg | ORAL_TABLET | Freq: Every day | ORAL | Status: DC
  Filled 2020-10-26 (×3): qty 1

## 2020-10-26 MED ORDER — ACETAMINOPHEN 500 MG PO TABS: 500.0000 mg | ORAL_TABLET | Freq: Four times a day (QID) | ORAL | Status: DC | PRN

## 2020-10-26 MED ORDER — ATENOLOL 25 MG PO TABS
25.0000 mg | ORAL_TABLET | Freq: Two times a day (BID) | ORAL | Status: DC
  Administered 2020-10-26 – 2020-10-28 (×3): 25 mg via ORAL
  Filled 2020-10-26 (×3): qty 1

## 2020-10-26 MED ORDER — TICAGRELOR 90 MG PO TABS
90.0000 mg | ORAL_TABLET | Freq: Two times a day (BID) | ORAL | Status: DC
  Administered 2020-10-26 – 2020-10-28 (×3): 90 mg via ORAL
  Filled 2020-10-26 (×3): qty 1

## 2020-10-26 MED ORDER — MIRTAZAPINE 15 MG PO TBDP
15.0000 mg | ORAL_TABLET | Freq: Every day | ORAL | Status: DC
  Filled 2020-10-26 (×3): qty 1

## 2020-10-26 MED ORDER — SERTRALINE HCL 50 MG PO TABS
50.0000 mg | ORAL_TABLET | Freq: Every day | ORAL | Status: DC
  Administered 2020-10-27 – 2020-10-28 (×2): 50 mg via ORAL
  Filled 2020-10-26 (×2): qty 1

## 2020-10-26 MED ORDER — ATORVASTATIN CALCIUM 40 MG PO TABS
80.0000 mg | ORAL_TABLET | Freq: Every day | ORAL | Status: DC
  Administered 2020-10-27 – 2020-10-28 (×2): 80 mg via ORAL
  Filled 2020-10-26 (×3): qty 2

## 2020-10-26 NOTE — Discharge Instructions (Signed)
Patient to be transferred to [facility] ED for medical clearance and overnight observation for stabilization and safety monitoring.

## 2020-10-26 NOTE — ED Notes (Addendum)
[facility] arrived to transport pt to [facility]. Pt A&O x2 (person, place), ambulatory. No signs of acute distress noted. Pt escorted to [facility] by staff without issue. Pt had no belongings in locker. Safety maintained.

## 2020-10-26 NOTE — BH Assessment (Signed)
[name] [name] [id number]: URGENT: Patient is a 80 year old female that presents with her daughter [name] [phone] for ongoing paranoia that has developed the last 5 days and has worsened in the last 48 hours. Patient has a history of vascular dementia (See Epic notes) and depression. Patient is currently prescribed Zoloft, Remeron and Seroquel for symptom management and reports current compliance. Patient resides alone and her daughter has assisted with ongoing care the last 6 months due to patient's mental and physical health declining. Patient denies any S/I, H/I or AVH although reports ongoing paranoia stating that "they" are trying to "hack her bank account and have been following her." Patient is uncertain who is responds ible for above. Patient does report problems urinating the last few days and increased frequency. Patient has a history of also having several strokes in the past. Patient denies any SA use. Patient is oriented x 5 and memory appears to be intact.

## 2020-10-26 NOTE — ED Notes (Signed)
Report called to Daron Offer charge RN
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
## 2020-10-26 NOTE — ED Provider Notes (Addendum)
Behavioral Health Urgent Care Medical Screening Exam  Patient Name: [name] [name] MRN: [id number] Date of Evaluation: 10/27/20 Chief Complaint: Chief Complaint/Presenting Problem: Anxiety Diagnosis:  Final diagnoses:  GAD (generalized anxiety disorder)  MDD (major depressive disorder), recurrent episode, moderate (HCC)  Insomnia, unspecified type  Paranoia ([place])   Disposition: Based on patient's history, patient's presentation, obtained collateral information from patient's daughter, and my assessment of the patient, there is some concern regarding potential SI that may have been mentioned by the patient to the patient's daughter on 10/25/2020 and 10/26/2020 (mentioned by patient's daughter, see details in HPI below), I believe that patient is experiencing some paranoia, delusions, and anxiety that is severely negatively impacting her activities of daily living and recommend continuous overnight observation/assessment for the patient with reassessment by psychiatry on the morning of 10/27/2020. Patient endorses increased urinary frequency and urgency (patient and patient's mother state that the patient used the restroom 5 times while they were in the [facility] waiting room).  Patient's mother is concerned for potential UTI.  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  Based on patient's age (4), stroke history, and concern for patient's current presentation being potentially related to potential dementia or potential UTI, will transfer patient to [facility] ED for medical clearance and overnight observation for safety monitoring and patient will be reevaluated by psychiatry via telepsych in the ED once medically cleared on 10/27/2020 to determine patient's psych disposition at that time upon 10/27/20 psychiatry reassessment.  Provider report given to  Dr. Sherry Ruffing via phone and Dr. Sherry Ruffing has agreed to accept the patient. [place] nursing report given to [facility] ED nursing staff.  EMTALA form completed.  Notified Dr. Sherry Ruffing that the EDP assuming the care of the patient in the ED should place a TTS consult so the patient can be reevaluated by psychiatry on 10/27/2020 once the patient is medically cleared.  Dr. Sherry Ruffing verbalizes understanding and agreement of this plan. Patient uses CPAP at home (patient's mother states that patient may not use her CPAP regularly) and this has been communicated to Dr. Sherry Ruffing as well.   History of Present illness: [name] [name] is a 80 y.o. female with psychiatric history significant for GAD, MDD, insomnia and past medical history of 3 prior CVAs who presents to [facility] as a voluntary walk-in accompanied by her daughter [name] [name]: [phone]) for concerns regarding paranoia and delusional thought processes. Patient's daughter states that the patient began exhibiting this worsening paranoid behavior/delusional thinking a few days to 1 week ago and that these symptoms became much worse today. With patient's consent, patient's daughter present during patient's assessment and information was obtained from both the patient and the patient's daughter for this assessment.  When patient was asked why she came to [facility] today, patient states that she is concerned because a group of people that consists of people from her church and her retirement community that she currently lives in are conspiring against her.  Patient states that today on 10/26/2020, a group of people from her church and retirement community sent the patient an email on Facebook that was also sent to many other people in the patient's church community in a retirement community.  Patient states that she cannot remember the exact subject matter of this email, but she states that the purpose of this  email was to try to force the patient out of her church community and  retirement community.  Patient states  "the church is against me" and "they don't want me in the neighborhood".  Patient states that this email was sent to her daughter as well, but patient's daughter states that there is a new was not sent to her.  Patient's daughter states that there is no evidence of this email ever being sent to the patient. Patient states that she does not currently have access to this email anymore and she states that she is sure that the group of people that sent the email originally on 10/26/2020 then went back and deleted the email so that the patient could no longer see the email anymore (patient states she cannot confirm this is what happened, but patient states that she is pretty sure that this is what happened, patient states "they know how to fix it and then it went away"). Patient's daughter states that the patient has told her daughter "that everyone is out to get her".  Patient states that she did statements to her daughter, but patient states that this statement is true.  Patient's daughter expresses concerns that the patient has been experiencing paranoia recently, but patient is adamant that she is not paranoid and she states that everything mentioned above is true.  Patient also expresses concern that she is afraid that she is going to get her home taken away from her if she does not pay her bills on time.  Patient's daughter states that the patient paying bills on time is not an issue and patient's daughter also states that the patient owns her home.  Patient also reports that on the afternoon of 10/26/2020, she saw one of her neighbors sitting in their garage on their phone and patient states that she is convinced that this neighbor was talking to somebody on the phone about the patient and that this neighbor "wanted me to reach climax".  Patient states that people in her retirement community have "been acting strange to me" and "people have been pulling the blinds down" in her  neighborhood.   Per chart review, patient presented to [facility] with her daughter on 10/11/2020 for increased anxiety.  Patient was being prescribed Lexapro by her PCP at the time of the 10/11/2020 [facility] visit (patient's daughter states that patient started out on Lexapro 5 mg, which was then increased to 10 mg, and was increased to 15 mg at the time of the 10/11/2020 [facility] visit).  Per chart review, patient's Lexapro was discontinued at 10/11/20 [facility] visit and patient was initiated on Seroquel 12.5 mg QHS and Remeron 7.5 mg QHS.  Chart review of this 10/11/2020 [facility] visit also shows that patient's daughter was to schedule an appointment with an outpatient psychiatrist for the patient and patient was to stay with patient's daughter for 2 nights in order to be monitored for any potential negative side effects from the Remeron and Seroquel.  Patient's daughter states that the patient did stop taking Lexapro and began taking Seroquel 12.5 mg QHS and Remeron 7.5 mg QHS after the patient's 10/11/20 [place] visit.  Patient's daughter states that for the first 2 nights of these medications, the patient slept well but she states that after these first 2 nights, patient then became anxious again and had trouble sleeping again.  Patient's daughter reports that she was able to schedule an outpatient psychiatry appointment for the patient at [facility] and that the patient had her first  [facility] appointment on 10/19/2020.  Patient's daughter states that at the 10/19/2020 [facility] appointment, patient's Seroquel was increased to 25 mg QHS, patient's Remeron was increased to 15 mg QHS, and patient was also started on Zoloft.  Patient's daughter states she cannot remember what dosage of Zoloft the patient is mostly taking but she states that the patient was started on 1/2 of a Zoloft pill daily for 7 days and was then instructed to increase to 1 whole Zoloft pill daily after the first 7 days.  Per chart review, patient did have a visit at  [facility] on 10/19/2020 with the following medication changes noted for that visit: increase Seroquel to 25 mg QHS, increase Remeron to 15 mg QHS, Start Zoloft 25 mg (1/2 of 1 50 mg tablet) x 7 days, then Zoloft 50 mg (1 whole 50 mg tablet) after first 7 days.  Patient and patient's daughter stated that the patient is not taking any additional psychotropic medications at this time.  Patient's daughter states that the patient has already had her Zoloft today but has not yet had her evening doses of Seroquel or Remeron yet.  Patient denies SI on exam and is adamant that she is not suicidal. Patient does state that she does not know what one is supposed to do when they feel the stress and anxiety that she is currently feeling.  However, patient's daughter states that on 10/25/20 and earlier today on 10/26/2020, the patient apologized to the patient's daughter for regarding the patient's daughter's life and being a bad mother to the patient's daughter and also stated "maybe I'll just do something".  Patient's mother states that the patient did not elaborate on this "maybe I'll just do something" statement that she made on 10/26/20 or 10/27/20, and the patient's mother expresses concern that this may have been a potential suicidal statement and patient's daughter also states that the patient told patient's mother "I don't own a gun" recently over the past few days.  Patient and patient's daughter denies any access to guns or weapons (other than [employer]) in the patient's home.  Patient and patient's daughter deny any past suicide attempts.  Patient and patient's daughter deny any history of the patient engaging in self-injurious behavior via intentionally cutting or burning herself.  Patient denies HI and patient's daughter denies any history of the patient mentioning any HI to her.  Patient denies AVH on exam.  Patient and patient's daughter do state that the patient was experiencing visual hallucinations in the past,  in which the patient states that she used to "see something by my bedside" at nighttime in the past, but patient and patient's daughter state that these visual hallucinations stopped occurring once the patient began taking her Seroquel and Remeron on 10/11/20.  Patient and patient's daughter denied any history of the patient experiencing auditory hallucinations.  Patient reports that she has been sleeping poorly, but is unsure about how many hours per night she has been sleeping.  Patient denies anhedonia.  Patient endorses feelings of depression, hopelessness, and worthlessness, but denies feelings of guilt.  Patient denies energy changes currently and reports declines in energy "only when I don't get enough sleep".  Patient does report recent intermittent declines in concentration.  Patient and patient's daughter report that the patient's appetite has been decreased over the past year secondary to the patient's depression and anxiety.  Patient's daughter states that she makes the patient write down the meals that she eats every day.  Patient's daughter  states that the patient has probably lost about 50 pounds since last February and she states that the patient has probably lost about 30 pounds over the last 3 months.  Patient states that she uses a CPAP machine at night, but patient's daughter states that she is unsure if the patient uses a CPAP machine regularly.  Patient lives alone in [place] community and has been living there since October 2020.  Patient's mother states that she keeps the patient's medication bottles at her house and she comes over to the patient's home every week to put the patient's medications in the patient's weekly Sunday through Saturday medication box.  Patient's daughter states that she patient's daughter states that she checks on the patient at the patient's home periodically, but she states that the patient is going to have someone with home health care begin coming  to the patient's home starting next week.  Patient denies any alcohol, tobacco, or illicit drug use.  Patient has had 3 strokes in the past.  Per patient's daughter, patient's past strokes occurred in February 2021, September 2021, in October 2021.  Patient's daughter states that the patient has never been formally evaluated or diagnosed with any type of dementia.  Patient's daughter states that she called [facility] on Tuesday, 10/23/2020 to express her concerns regarding potential dementia of the patient.  Patient's daughter states that the patient's psychiatric NP at [facility] discussed the patient with a psychiatrist at [facility] at that time and then communicated to the patient's daughter that it is possible that the patient may be developing some vascular dementia.  Patient's daughter states that the patient has an appointment with Dr. Leonie Man at [facility] neurology on 11/05/2020 to discuss this matter further.  Patient's mother states that the patient last had an MRI of her brain in March 2022.  Per chart review, patient had an MRI brain without contrast on 08/04/2020 with the following overall impression: "1. No acute intracranial abnormality. 2. 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 3. Severe chronic small vessel ischemic disease."  Patient's daughter states that the patient's next [facility] appointment is in about 3 weeks.  Patient states that she is not currently seeing a therapist and patient's daughter states that it has probably been least 20 to 30 years since the patient is seeing a therapist.  Patient states that she was psychiatrically hospitalized on 1 occasion in the past, in which the patient states took place in [place] in 1973/1974.  Patient's daughter expresses concern that the patient may have a UTI and may be experiencing some confusion secondary to a potential UTI.  Patient endorses increased urinary frequency and urgency over the past few days (patient and patient's  daughter states that the patient went to the bathroom/urinated 5 times while the patient was waiting to be seen in the [facility] lobby).  Patient also endorses having some issues with not being able to urinate right away when she attempts to urinate and states that she often has to use more/push harder in order to expel urine.  Patient denies any dysuria, urinary incontinence, hematuria, or any additional urinary symptoms. Patient's daughter states that she purchased an over the counter UTI test from a pharmacy 1-2 days ago and used this on the patient. Patient's daughter states that this OTC UTI test was negative for UTI. Patient also endorses some intermittent diarrhea over the past few days which she attributes to her stress/anxiety patient denies any fever, chills, lightheadedness, dizziness, recent falls, loss of  consciousness, chest pain, shortness of breath, abdominal pain, nausea, vomiting, or any additional physical symptoms at this time.  Psychiatric Specialty Exam  Presentation  General Appearance:Well Groomed  Eye Contact:Good  Speech:Clear and Coherent; Normal Rate  Speech Volume:Normal  Handedness:No data recorded  Mood and Affect  Mood:Anxious; Depressed  Affect:Congruent   Thought Process  Thought Processes:Coherent; Goal Directed (Circumferential)  Descriptions of Associations:Circumstantial  Orientation:Full (Time, Place and Person)  Thought Content:Paranoid Ideation; Delusions  Diagnosis of Schizophrenia or Schizoaffective disorder in past: No  Duration of Psychotic Symptoms: Less than six months  Hallucinations:-- (Patient denies AVH on exam. She reports experiencing VH before her 10/11/20 [facility] visit, but she denies any VH since 10/11/20. Patient denies any history AH.)  Ideas of Reference:Paranoia; Delusions  Suicidal Thoughts:-- (Patient denies SI on exam, but patient's daughter states that the patient stated "Maybe I'll just do something" on 10/25/20 and earlier on  10/26/20. See HPI for further details.)  Homicidal Thoughts:No   Sensorium  Memory:Immediate Fair; Recent Fair; Remote [place]  Insight:Fair   Executive Functions  Concentration:Fair  Attention Span:Fair  [place]  Language:Good   Psychomotor Activity  Psychomotor Activity:Normal   Assets  Assets:Communication Skills; Desire for Improvement; Financial Resources/Insurance; Housing; Leisure Time; Physical Health; Resilience; Social Support; Transportation   Sleep  Sleep:Poor  Number of hours: No data recorded  Physical Exam: Physical Exam Vitals reviewed.  Constitutional:      General: She is not in acute distress.    Appearance: She is not ill-appearing, toxic-appearing or diaphoretic.  HENT:     Head: Normocephalic and atraumatic.     Right Ear: External ear normal.     Left Ear: External ear normal.  Eyes:     General:        Right eye: No discharge.        Left eye: No discharge.     Pupils: Pupils are equal, round, and reactive to light.  Cardiovascular:     Rate and Rhythm: Normal rate and regular rhythm.  Pulmonary:     Effort: Pulmonary effort is normal. No respiratory distress.     Comments: Lungs CTA in bilateral anterior and posterior lung fields.  Musculoskeletal:        General: Normal range of motion.     Cervical back: Normal range of motion.  Neurological:     Mental Status: She is alert and oriented to person, place, and time.     Comments: No tremor noted. No sensory or motor deficits noted on patient's bilateral upper or lower extremities. Patient's wrist flexion/extension strength 4/5 bilaterally and patient's dorsiflexion/plantar flexion 4/5 bilaterally.   Psychiatric:        Attention and Perception: She does not perceive auditory or visual hallucinations.        Mood and Affect: Mood is anxious and depressed.        Speech: Speech normal.        Behavior: Behavior is not agitated, slowed,  aggressive, withdrawn, hyperactive or combative. Behavior is cooperative.        Thought Content: Thought content is paranoid and delusional. Thought content does not include homicidal ideation.     Comments: Affect mood congruent. Patient denies SI on exam and is adamant that she is not suicidal. Patient does state that she does not know what one is supposed to do when they feel the stress and anxiety that she is currently feeling.  However, patient's daughter states that on 10/25/20  and earlier today on 10/26/2020, the patient apologized to the patient's daughter for regarding the patient's daughter's life and being a bad mother to the patient's daughter and also stated "maybe I'll just do something".  Patient's mother states that the patient did not elaborate on this "maybe I'll just do something" statement that she made on 10/26/20 or 10/27/20, and the patient's mother expresses concern that this may have been a potential suicidal statement and patient's daughter also states that the patient told patient's mother "I don't own a gun" recently over the past few days.  Patient and patient's daughter denies any access to guns or weapons (other than [employer]) in the patient's home. Patient denies AVH on exam.  Patient and patient's daughter do state that the patient was experiencing visual hallucinations in the past, in which the patient states that she used to "see something by my bedside" at nighttime in the past, but patient and patient's daughter state that these visual hallucinations stopped occurring once the patient began taking her Seroquel and Remeron on 10/11/20.  Patient and patient's daughter denied any history of the patient experiencing auditory hallucinations.     Review of Systems  Constitutional: Positive for weight loss. Negative for chills, diaphoresis, fever and malaise/fatigue.       Patient denies energy changes currently and reports declines in energy "only when I don't get enough sleep".   HENT: Negative for congestion.   Respiratory: Negative for cough and shortness of breath.   Cardiovascular: Negative for chest pain and palpitations.  Gastrointestinal: Positive for diarrhea. Negative for abdominal pain, constipation, nausea and vomiting.  Genitourinary: Positive for frequency and urgency. Negative for dysuria and hematuria.       Negative for hematuria.  Musculoskeletal: Negative for joint pain and myalgias.  Neurological: Negative for dizziness, loss of consciousness and headaches.  Psychiatric/Behavioral: Positive for depression. Negative for hallucinations and substance abuse. The patient is nervous/anxious and has insomnia.            Comments: Affect mood congruent. Patient denies SI on exam and is adamant that she is not suicidal. Patient does state that she does not know what one is supposed to do when they feel the stress and anxiety that she is currently feeling.  However, patient's daughter states that on 10/25/20 and earlier today on 10/26/2020, the patient apologized to the patient's daughter for regarding the patient's daughter's life and being a bad mother to the patient's daughter and also stated "maybe I'll just do something".  Patient's mother states that the patient did not elaborate on this "maybe I'll just do something" statement that she made on 10/26/20 or 10/27/20, and the patient's mother expresses concern that this may have been a potential suicidal statement and patient's daughter also states that the patient told patient's mother "I don't own a gun" recently over the past few days.   All other systems reviewed and are negative.    Vitals: Blood pressure (!) 162/77, pulse 71, temperature 97.6 F (36.4 C), resp. rate 16, height 4\' 11"  (1.499 m), weight 59 kg, SpO2 98 %. Body mass index is 26.26 kg/m.  Musculoskeletal: Strength & Muscle Tone: within normal limits Gait & Station: normal Patient leans: N/A   [place] MSE Discharge Disposition for Follow up and  Recommendations: Based on my evaluation, the patient does not appear to have an emergency medical condition.  Based on patient's history, patient's presentation, obtained collateral information from patient's daughter, and my assessment of the patient, there is  some concern regarding potential SI that may have been mentioned by the patient to the patient's daughter on 10/25/2020 and 10/26/2020 (mentioned by patient's daughter, see details in HPI below), I believe that patient is experiencing some paranoia, delusions, and anxiety that is severely negatively impacting her activities of daily living and recommend continuous overnight observation/assessment for the patient with reassessment by psychiatry on the morning of 10/27/2020. Patient endorses increased urinary frequency and urgency (patient and patient's mother state that the patient used the restroom 5 times while they were in the [facility] waiting room).  Patient's mother is concerned for potential UTI.  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  Based on patient's age (26), stroke history, and concern for patient's current presentation being potentially related to potential dementia or potential UTI, will transfer patient to [facility] ED for medical clearance and overnight observation for safety monitoring and patient will be reevaluated by psychiatry via telepsych in the ED once medically cleared on 10/27/2020 to determine patient's psych disposition at that time upon 10/27/20 psychiatry reassessment.  Provider report given to Dr. Sherry Ruffing via phone and Dr. Sherry Ruffing has agreed to accept the patient. [place] nursing report given to [facility] ED nursing staff.  EMTALA form completed.  Notified Dr. Sherry Ruffing that the EDP assuming the care of the patient in the ED should place a TTS consult so the patient can be  reevaluated by psychiatry on 10/27/2020 once the patient is medically cleared.  Dr. Sherry Ruffing verbalizes understanding and agreement of this plan. Patient uses CPAP at home (patient's mother states that patient may not use her CPAP regularly) and this has been communicated to Dr. Sherry Ruffing as well.    Prescilla Sours, PA-C 10/27/2020, 4:48 AM

## 2020-10-26 NOTE — ED Provider Notes (Signed)
Emergency Medicine Provider Triage Evaluation Note  LATOIYA MARADIAGA , a 80 y.o. female  was evaluated in triage.  Pt presents to ER as recommended by psychiatry team for overnight observations.  They are concerned that she is experiencing paranoia and anxiety.  States that someone is the charge has messed with her email she is under tremendous amount of stress.  Denies any SI, HI, AVH.  Review of Systems  Positive: Paranoia Negative: SI, HI, AVH  Physical Exam  BP (!) 163/91 (BP Location: Left Arm)   Pulse 76   Temp 98.1 F (36.7 C) (Oral)   Resp 16   Ht 4\' 11"  (1.499 m)   Wt 60.3 kg   SpO2 98%   BMI 26.86 kg/m  Gen:   Awake, no distress   Resp:  Normal effort  MSK:   Moves extremities without difficulty  Other:  pleasant  Medical Decision Making  Medically screening exam initiated at 9:45 PM.  Appropriate orders placed.  CHRISTINA GINTZ was informed that the remainder of the evaluation will be completed by another provider, this initial triage assessment does not replace that evaluation, and the importance of remaining in the ED until their evaluation is complete.  Med clearance labs ordered   Delia Heady, Hershal Coria 10/26/20 2148    Tegeler, Gwenyth Allegra, MD 10/26/20 [phone]

## 2020-10-26 NOTE — ED Provider Notes (Signed)
[facility] EMERGENCY DEPARTMENT Provider Note   CSN: [id number] Arrival date & time: 10/26/20  2129     History Chief Complaint  Patient presents with  . Anxiety    [name] [name] is a 80 y.o. female.  The history is provided by the patient and medical records.  Anxiety    80 y.o. F with hx arthritis, depression, fatty liver, GERD, HLP, HTN, obesity, sleep apnea, prior stroke, presenting to the ED from [facility] for ongoing psychiatric evaluation.  Apparently lately she has been having some adjustment issues after moving into new assisted living facility.  She has been paranoid and thinks people there, at her church, and in the community are "out to get her".  She has been increasingly anxious.  She did have some medication adjustments in may at her OP psychiatrists office.  Daughter had some concerns over statements made yesterday while talking with patient and she voiced "well maybe ill just do something" when speaking about being a burden and a "bad mother" to her daugther.  Patient does not elaborate on this further.  On arrival to ED, still appears anxious and is asking something for her "nerves".  Past Medical History:  Diagnosis Date  . Arthritis    knees  . Depression   . Fatty liver   . GERD (gastroesophageal reflux disease)   . Hyperlipidemia   . Hypertension   . IBS (irritable bowel syndrome)   . Obesity   . Pneumonia   . Sleep apnea    uses CPAP  . Stroke (cerebrum) [facility])     Patient Active Problem List   Diagnosis Date Noted  . Shingles 08/30/2020  . Acute CVA (cerebrovascular accident) ([place]) 04/18/2020  . Class 1 obesity due to excess calories with body mass index (BMI) of 30.0 to 30.9 in adult 04/18/2020  . Seasonal and perennial allergic rhinitis 03/28/2020  . HLD (hyperlipidemia) 02/16/2020  . Stroke ([place]) 02/16/2020  . Fall 02/16/2020  . Abdominal wall hernia 10/30/2019  . Change in voice 10/30/2019  . History of stroke 09/14/2019  .  Spinal stenosis 07/24/2019  . Mixed hyperlipidemia   . OSA (obstructive sleep apnea)   . Dysfunction of eustachian tube 05/15/2019  . Other social stressor 03/29/2019  . 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 Diarrhea 06/20/2018  . Joint pain 06/20/2018  . 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 Medicare annual wellness visit, subsequent 09/15/2017  . SUI (stress urinary incontinence, female) 08/07/2017  . Back pain 06/16/2016  . Advance care planning 01/07/2016  . Sleep apnea   . 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 Allergic rhinitis 11/24/2014  . Adult BMI 30+ 11/24/2014  . Depression 11/24/2014  . Insomnia 08/23/2009  . Headache, migraine 05/10/2009  . HERPES SIMPLEX INFECTION 08/14/2006  . HYPERCHOLESTEROLEMIA 08/14/2006  . Anxiety state 08/14/2006  . Essential hypertension 08/14/2006  . HIATAL HERNIA 08/14/2006  . IRRITABLE BOWEL SYNDROME 08/14/2006  . 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 Primary osteoarthritis of right knee 08/14/2006  . PLANTAR FASCIITIS 08/14/2006    Past Surgical History:  Procedure Laterality Date  . BLADDER SURGERY    . BREAST SURGERY     breast biopsy  . BROW LIFT Bilateral 04/14/2017   Procedure: BLEPHAROPLASTY UPPER EYELID WITH EXCESS SKIN;  Surgeon: Karle Starch, MD;  Location: [place];  Service: Ophthalmology;  Laterality: Bilateral;  . CATARACT EXTRACTION W/PHACO Left 02/05/2016   Procedure: CATARACT EXTRACTION PHACO AND INTRAOCULAR  LENS PLACEMENT (IOC);  Surgeon: Birder Robson, MD;  Location: [facility];  Service: Ophthalmology;  Laterality: Left;  [place] 01:10 AP% 22.3 CDE 15.67 Fluid pack lot # [id number]  . CATARACT EXTRACTION W/PHACO Right 02/26/2016   Procedure: CATARACT EXTRACTION PHACO AND INTRAOCULAR LENS PLACEMENT (IOC);  Surgeon: Birder Robson, MD;  Location: [facility];  Service: Ophthalmology;  Laterality: Right;  [place] 57.4 AP% 24.0 CDE 13.75 Fluid Pack lot #  [id number]  . CHOLECYSTECTOMY    . COLONOSCOPY WITH PROPOFOL N/A 12/18/2014   Procedure: COLONOSCOPY WITH PROPOFOL;  Surgeon: Manya Silvas, MD;  Location: [facility] ENDOSCOPY;  Service: Endoscopy;  Laterality: N/A;  . DEEP NECK LYMPH NODE BIOPSY / EXCISION    . implantable loop recorder implantation  06/20/2020   Medtronic Reveal Linq model LNQ 22 ([place] [id number]) implantable loop recorder for cryptogenic stroke  . JOINT REPLACEMENT    . KNEE ARTHROPLASTY Left 06/20/2015   Procedure: COMPUTER ASSISTED TOTAL KNEE ARTHROPLASTY;  Surgeon: Dereck Leep, MD;  Location: [facility] OR[facility];  Service: Orthopedics;  Laterality: Left;  . PTOSIS REPAIR Bilateral 04/14/2017   Procedure: PTOSIS REPAIR RESECT EX;  Surgeon: Karle Starch, MD;  Location: [place];  Service: Ophthalmology;  Laterality: Bilateral;  sleep apnea  . TONSILLECTOMY    . TOTAL HIP ARTHROPLASTY Right 07/21/2018   Procedure: TOTAL HIP ARTHROPLASTY ANTERIOR APPROACH;  Surgeon: Gaynelle Arabian, MD;  Location: [facility];  Service: Orthopedics;  Laterality: Right;     OB History   No obstetric history on file.     Family History  Problem Relation Age of Onset  . Heart attack Mother   . Dementia Father   . Heart attack Father 27  . Aortic aneurysm Brother   . Stroke Paternal Grandfather   . Colon cancer Neg Hx   . Breast cancer Neg Hx     Social History   Tobacco Use  . Smoking status: Never Smoker  . Smokeless tobacco: Never Used  Vaping Use  . Vaping Use: Never used  Substance Use Topics  . Alcohol use: Yes    Comment: ocassional glass of wine 2-3 times a year  . Drug use: No    Home Medications Prior to Admission medications   Medication Sig Start Date End Date Taking? Authorizing Provider  acetaminophen (TYLENOL) 500 MG tablet Take 500 mg by mouth every 6 (six) hours as needed for moderate pain.     [provider]  atenolol (TENORMIN) 25 MG tablet Take 1 tablet (25 mg total) by mouth 2 (two) times daily.  05/15/20   Tonia Ghent, MD  atorvastatin (LIPITOR) 80 MG tablet TAKE ONE TABLET BY MOUTH EVERY DAY AT 6PM 05/23/20   Tonia Ghent, MD  BRILINTA 90 MG TABS tablet TAKE ONE TABLET TWICE DAILY 10/23/20   Tonia Ghent, MD  cholecalciferol (VITAMIN D) 1000 UNITS tablet Take 1,000 Units by mouth daily.     [provider]  hydrOXYzine (ATARAX/VISTARIL) 10 MG tablet TAKE 1/2-1 TABLET BY MOUTH 3 TIMES DAILYAS NEEDED FOR ANXIETY (SEDATION CAUTION) Patient not taking: Reported on 10/19/2020 09/10/20   Tonia Ghent, MD  ipratropium (ATROVENT) 0.06 % nasal spray one spray per nostril twice daily (up to three times daily) to help with runny nose 03/28/20   Valentina Shaggy, MD  loratadine (CLARITIN) 10 MG tablet Take 10 mg by mouth daily as needed for allergies. Patient not taking: Reported on 10/19/2020    [provider]  mirtazapine (REMERON SOL-TAB)  15 MG disintegrating tablet Take 1 tablet (15 mg total) by mouth at bedtime. 10/19/20   Elwanda Brooklyn, NP  montelukast (SINGULAIR) 10 MG tablet Stop as of 09/27/20 Patient not taking: Reported on 10/19/2020 09/27/20   Tonia Ghent, MD  polyethylene glycol powder (GLYCOLAX/MIRALAX) 17 GM/SCOOP powder Take 17 g by mouth daily as needed for mild constipation. 05/15/20   Tonia Ghent, MD  QUEtiapine (SEROQUEL) 25 MG tablet Take 1 tablet (25 mg total) by mouth at bedtime. 10/19/20   Elwanda Brooklyn, NP  sertraline (ZOLOFT) 50 MG tablet Take one half table 25 mg for 7 days, then one whole tablet. 10/19/20   Elwanda Brooklyn, NP    Allergies    Hydrocodone  Review of Systems   Review of Systems  Psychiatric/Behavioral: The patient is nervous/anxious.   All other systems reviewed and are negative.   Physical Exam Updated Vital Signs BP (!) 163/91 (BP Location: Left Arm)   Pulse 76   Temp 98.1 F (36.7 C) (Oral)   Resp 16   Ht 4\' 11"  (1.499 m)   Wt 65 kg   SpO2 98%   BMI 28.94 kg/m   Physical Exam Vitals and  nursing note reviewed.  Constitutional:      Appearance: She is well-developed.     Comments: Elderly  HENT:     Head: Normocephalic and atraumatic.  Eyes:     Conjunctiva/sclera: Conjunctivae normal.     Pupils: Pupils are equal, round, and reactive to light.  Cardiovascular:     Rate and Rhythm: Normal rate and regular rhythm.     Heart sounds: Normal heart sounds.  Pulmonary:     Effort: Pulmonary effort is normal.     Breath sounds: Normal breath sounds.  Abdominal:     General: Bowel sounds are normal.     Palpations: Abdomen is soft.  Musculoskeletal:        General: Normal range of motion.     Cervical back: Normal range of motion.  Skin:    General: Skin is warm and dry.  Neurological:     Mental Status: She is alert and oriented to person, place, and time.  Psychiatric:        Mood and Affect: Mood is anxious.     Comments: Anxious and asking lots of questions but redirectable     ED Results / Procedures / Treatments   Labs (all labs ordered are listed, but only abnormal results are displayed) Labs Reviewed  CBC WITH DIFFERENTIAL/PLATELET - Abnormal; Notable for the following components:      Result Value   Hemoglobin 16.1 (*)    HCT 48.8 (*)    All other components within normal limits  SALICYLATE LEVEL - Abnormal; Notable for the following components:   Salicylate Lvl <5.6 (*)    All other components within normal limits  ACETAMINOPHEN LEVEL - Abnormal; Notable for the following components:   Acetaminophen (Tylenol), Serum <10 (*)    All other components within normal limits  URINALYSIS, ROUTINE W REFLEX MICROSCOPIC - Abnormal; Notable for the following components:   Hgb urine dipstick SMALL (*)    Leukocytes,Ua TRACE (*)    Bacteria, UA RARE (*)    All other components within normal limits  COMPREHENSIVE METABOLIC PANEL - Abnormal; Notable for the following components:   Sodium 134 (*)    Glucose, Bld 109 (*)    Alkaline Phosphatase 146 (*)    All  other components within normal  limits  ETHANOL  RAPID URINE DRUG SCREEN, HOSP PERFORMED    EKG None  Radiology No results found.  Procedures Procedures   Medications Ordered in ED Medications - No data to display  ED Course  I have reviewed the triage vital signs and the nursing notes.  Pertinent labs & imaging results that were available during my care of the patient were reviewed by me and considered in my medical decision making (see chart for details).    MDM Rules/Calculators/A&P                         80 y.o. F transferred from [facility] for overnight observation and repeat psychiatric evaluation in the morning.  On arrival, still appears somewhat anxious and is asking for something for her "nerves".  Work-up is overall reassuring, no profound anemia, no electrolyte derangement.  Toxicology labs are negative.  UA without signs of infection.  Medically cleared.  Home meds ordered including her night time seroquel.  Psychiatry team to follow and reassess in the AM.  Final Clinical Impression(s) / ED Diagnoses Final diagnoses:  Paranoia [facility])  Anxiety disorder, unspecified type    Rx / DC Orders ED Discharge Orders    None       Larene Pickett, PA-C 10/26/20 2337    Orpah Greek, MD 10/28/20 [phone]

## 2020-10-26 NOTE — ED Triage Notes (Signed)
Patient reports highly mentally/emotionally  stressed this week "just moved in to a neighborhood" , denies SI or HI , no hallucinations .

## 2020-10-26 NOTE — ED Notes (Signed)
[facility] called 

## 2020-10-26 NOTE — Progress Notes (Signed)
RADLEY TESTON [id number]: URGENT: Patient is a 80 year old female that presents with her daughter [name] [phone] for ongoing paranoia that has developed the last 5 days and has worsened in the last 48 hours. Patient has a history of vascular dementia (See Epic notes) and depression. Patient is currently prescribed Zoloft, Remeron and Seroquel for symptom management and reports current compliance. Patient resides alone and her daughter has assisted with ongoing care the last 6 months due to patient's mental and physical health declining. Patient denies any S/I, H/I or AVH although reports ongoing paranoia stating that "they" are trying to "hack her bank account and have been following her." Patient is uncertain who is responds ible for above. Patient does report problems urinating the last few days and increased frequency. Patient has a history of also having several strokes in the past. Patient denies any SA use. Patient is oriented x 5 and memory appears to be intact.

## 2020-10-26 NOTE — Telephone Encounter (Signed)
8:44 PM Received a call in the emergency department from the behavioral health PA.  Patient was reportedly brought in by family with concern for paranoia related to members of the church sending out an email and trying to "exile her".    The psychiatry team feels that she has been observed overnight and reassessed in the morning under involuntary status to decide if she needs placement or further management.  However, due to her age, they are not able to keep her there overnight and are going to transfer her to the [facility] emergency department for medical clearance and monitoring overnight.  She reportedly has been having some urinary frequency as well as some urinary hesitancy and they would like her to be checked for urinary tract infection.  They report she has had multiple strokes in the past but they denied any discovery of acute neurologic deficits or any new neurologic concerns.    Patient will be transferred to [facility] for medical clearance with labs and urinalysis and then they requested a TTS consult be placed after this is completed so that she can remain on their evaluation list to be seen in the morning over here.

## 2020-10-27 DIAGNOSIS — F4323 Adjustment disorder with mixed anxiety and depressed mood: Secondary | ICD-10-CM | POA: Diagnosis not present

## 2020-10-27 DIAGNOSIS — R45851 Suicidal ideations: Secondary | ICD-10-CM

## 2020-10-27 MED ORDER — LORAZEPAM 1 MG PO TABS: 1.0000 mg | ORAL_TABLET | Freq: Once | ORAL | Status: DC

## 2020-10-27 NOTE — ED Notes (Signed)
Patient daughter [name] would like an update [phone]

## 2020-10-27 NOTE — Consult Note (Signed)
Reached out to Leilani Able, RN and requested to see patient for psychiatry assessment.

## 2020-10-27 NOTE — Consult Note (Signed)
Telepsych Consultation   Reason for Consult:  "TTS consult" Referring Physician:  Delia Heady, PA-C Location of Patient:   [facility] Location of Provider: Other: virtual home visit  Patient Identification: [name] [name] MRN:  [id number] Principal Diagnosis: Adjustment disorder with mixed anxiety and depressed mood Diagnosis:  Principal Problem:   Adjustment disorder with mixed anxiety and depressed mood Active Problems:   Suicidal ideations   Total Time spent with patient: 30 minutes  Subjective:   [name] [name] is a 80 y.o. female patient admitted to [facility] for overnight observation for worsening paranoia. Patient states, "they are tying to get rid of me."   Patient seen via telepsych by this provider; chart reviewed and consulted with Dr. Dwyane Dee on 10/27/20.  On evaluation [name] [name] is alert and oriented to person,place and time.  Aware she is at [facility], able to state time time of year and knows "Barbette Or" is the president. States she did not eat her breakfast but had crackers. When asked why she is here she states,  "this is all staged. At first they put me in this dirty and dingy room.  Now they put me in a bright room." Patient is confused, paranoid and delusional, ruminates on  "this is staged and they are coming to hurt my family."  She does not qualify who "they" are is labile and states, "you just don't understand."      HPI:  Per EDP Admission Assessment dated 10/26/2020:  Chief Complaint  Patient presents with  . Anxiety    [name] [name] is a 80 y.o. female.  The history is provided by the patient and medical records.  Anxiety    80 y.o. F with hx arthritis, depression, fatty liver, GERD, HLP, HTN, obesity, sleep apnea, prior stroke, presenting to the ED from [facility] for ongoing psychiatric evaluation.  Apparently lately she has been having some adjustment issues after moving into new assisted living facility.  She has been paranoid and thinks people  there, at her church, and in the community are "out to get her".  She has been increasingly anxious.  She did have some medication adjustments in may at her OP psychiatrists office.  Daughter had some concerns over statements made yesterday while talking with patient and she voiced "well maybe ill just do something" when speaking about being a burden and a "bad mother" to her daugther.  Patient does not elaborate on this further.  On arrival to ED, still appears anxious and is asking something for her "nerves".   Past Psychiatric History:   Risk to Self:  yes Risk to Others:  no Prior Inpatient Therapy:   Prior Outpatient Therapy:    Past Medical History:  Past Medical History:  Diagnosis Date  . Arthritis    knees  . Depression   . Fatty liver   . GERD (gastroesophageal reflux disease)   . Hyperlipidemia   . Hypertension   . IBS (irritable bowel syndrome)   . Obesity   . Pneumonia   . Sleep apnea    uses CPAP  . Stroke (cerebrum) [facility])     Past Surgical History:  Procedure Laterality Date  . BLADDER SURGERY    . BREAST SURGERY     breast biopsy  . BROW LIFT Bilateral 04/14/2017   Procedure: BLEPHAROPLASTY UPPER EYELID WITH EXCESS SKIN;  Surgeon: Karle Starch, MD;  Location: [place];  Service: Ophthalmology;  Laterality: Bilateral;  . CATARACT EXTRACTION W/PHACO Left 02/05/2016  Procedure: CATARACT EXTRACTION PHACO AND INTRAOCULAR LENS PLACEMENT (IOC);  Surgeon: Birder Robson, MD;  Location: [facility] OR[facility];  Service: Ophthalmology;  Laterality: Left;  [place] 01:10 AP% 22.3 CDE 15.67 Fluid pack lot # [id number]  . CATARACT EXTRACTION W/PHACO Right 02/26/2016   Procedure: CATARACT EXTRACTION PHACO AND INTRAOCULAR LENS PLACEMENT (IOC);  Surgeon: Birder Robson, MD;  Location: [facility];  Service: Ophthalmology;  Laterality: Right;  [place] 57.4 AP% 24.0 CDE 13.75 Fluid Pack lot # [id number]  . CHOLECYSTECTOMY    . COLONOSCOPY WITH PROPOFOL N/A 12/18/2014   Procedure:  COLONOSCOPY WITH PROPOFOL;  Surgeon: Manya Silvas, MD;  Location: [facility] ENDOSCOPY;  Service: Endoscopy;  Laterality: N/A;  . DEEP NECK LYMPH NODE BIOPSY / EXCISION    . implantable loop recorder implantation  06/20/2020   Medtronic Reveal Linq model LNQ 22 ([place] [id number]) implantable loop recorder for cryptogenic stroke  . JOINT REPLACEMENT    . KNEE ARTHROPLASTY Left 06/20/2015   Procedure: COMPUTER ASSISTED TOTAL KNEE ARTHROPLASTY;  Surgeon: Dereck Leep, MD;  Location: [facility] OR[facility];  Service: Orthopedics;  Laterality: Left;  . PTOSIS REPAIR Bilateral 04/14/2017   Procedure: PTOSIS REPAIR RESECT EX;  Surgeon: Karle Starch, MD;  Location: [place];  Service: Ophthalmology;  Laterality: Bilateral;  sleep apnea  . TONSILLECTOMY    . TOTAL HIP ARTHROPLASTY Right 07/21/2018   Procedure: TOTAL HIP ARTHROPLASTY ANTERIOR APPROACH;  Surgeon: Gaynelle Arabian, MD;  Location: [facility];  Service: Orthopedics;  Laterality: Right;   Family History:  Family History  Problem Relation Age of Onset  . Heart attack Mother   . Dementia Father   . Heart attack Father 64  . Aortic aneurysm Brother   . Stroke Paternal Grandfather   . Colon cancer Neg Hx   . Breast cancer Neg Hx    Family Psychiatric  History: unknown Social History:  Social History   Substance and Sexual Activity  Alcohol Use Yes   Comment: ocassional glass of wine 2-3 times a year     Social History   Substance and Sexual Activity  Drug Use No    Social History   Socioeconomic History  . Marital status: Widowed    Spouse name: Not on file  . Number of children: Not on file  . Years of education: Not on file  . Highest education level: Not on file  Occupational History  . Occupation: retired  Tobacco Use  . Smoking status: Never Smoker  . Smokeless tobacco: Never Used  Vaping Use  . Vaping Use: Never used  Substance and Sexual Activity  . Alcohol use: Yes    Comment: ocassional glass of wine 2-3 times a year   . Drug use: No  . Sexual activity: Never  Other Topics Concern  . 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 Hydrocodone Nausea Only    Noted after surgery, may be able to tolerate with food    Labs:  Results for orders placed  or performed during the hospital encounter of 10/26/20 (from the past 48 hour(s))  Urine rapid drug screen (hosp performed)     Status: None   Collection Time: 10/26/20  9:38 PM  Result Value Ref Range   Opiates NONE DETECTED NONE DETECTED   Cocaine NONE DETECTED NONE DETECTED   Benzodiazepines NONE DETECTED NONE DETECTED   Amphetamines NONE DETECTED NONE DETECTED   Tetrahydrocannabinol NONE DETECTED NONE DETECTED   Barbiturates NONE DETECTED NONE DETECTED    Comment: (NOTE) DRUG SCREEN FOR MEDICAL PURPOSES ONLY.  IF CONFIRMATION IS NEEDED FOR ANY PURPOSE, NOTIFY LAB WITHIN 5 DAYS.  LOWEST DETECTABLE LIMITS FOR URINE DRUG SCREEN Drug Class                     Cutoff (ng/mL) Amphetamine and metabolites    1000 Barbiturate and metabolites    200 Benzodiazepine                 160 Tricyclics and metabolites     300 Opiates and metabolites        300 Cocaine and metabolites        300 THC                            50 Performed at [facility], [place] [address] [place], [place] [address]   Urinalysis, Routine w reflex microscopic Urine, Clean Catch     Status: Abnormal   Collection Time: 10/26/20  9:38 PM  Result Value Ref Range   Color, Urine YELLOW YELLOW   APPearance CLEAR CLEAR   Specific Gravity,  Urine 1.008 1.005 - 1.030   pH 5.0 5.0 - 8.0   Glucose, UA NEGATIVE NEGATIVE mg/dL   Hgb urine dipstick SMALL (A) NEGATIVE   Bilirubin Urine NEGATIVE NEGATIVE   Ketones, ur NEGATIVE NEGATIVE mg/dL   Protein, ur NEGATIVE NEGATIVE mg/dL   Nitrite NEGATIVE NEGATIVE   Leukocytes,Ua TRACE (A) NEGATIVE   RBC / HPF 0-5 0 - 5 RBC/hpf   WBC, UA 0-5 0 - 5 WBC/hpf   Bacteria, UA RARE (A) NONE SEEN   Squamous Epithelial / LPF 0-5 0 - 5   Mucus PRESENT    Hyaline Casts, UA PRESENT     Comment: Performed at [facility], [address] [place], [place] [address]  CBC with Diff     Status: Abnormal   Collection Time: 10/26/20  9:51 PM  Result Value Ref Range   WBC 9.6 4.0 - 10.5 K/uL   RBC 5.11 3.87 - 5.11 MIL/uL   Hemoglobin 16.1 (H) 12.0 - 15.0 g/dL   HCT 48.8 (H) 36.0 - 46.0 %   MCV 95.5 80.0 - 100.0 fL   MCH 31.5 26.0 - 34.0 pg   MCHC 33.0 30.0 - 36.0 g/dL   RDW 14.0 11.5 - 15.5 %   Platelets 305 150 - 400 K/uL   nRBC 0.0 0.0 - 0.2 %   Neutrophils Relative % 68 %   Neutro Abs 6.6 1.7 - 7.7 K/uL   Lymphocytes Relative 20 %   Lymphs Abs 1.9 0.7 - 4.0 K/uL   Monocytes Relative 8 %   Monocytes Absolute 0.8 0.1 - 1.0 K/uL   Eosinophils Relative 2 %   Eosinophils Absolute 0.2 0.0 - 0.5 K/uL   Basophils Relative 1 %   Basophils Absolute 0.1 0.0 - 0.1 K/uL   Immature Granulocytes 1 %   Abs  Immature Granulocytes 0.06 0.00 - 0.07 K/uL    Comment: Performed at [facility], [place] [address], [place], [place] [address]  Ethanol     Status: None   Collection Time: 10/26/20  9:52 PM  Result Value Ref Range   Alcohol, Ethyl (B) <10 <10 mg/dL    Comment: (NOTE) Lowest detectable limit for serum alcohol is 10 mg/dL.  For medical purposes only. Performed at [facility], [place] [address], [place], [place] [address]   Salicylate level     Status: Abnormal   Collection Time: 10/26/20  9:52 PM  Result Value Ref Range   Salicylate Lvl <4.7 (L) 7.0 - 30.0 mg/dL    Comment:  Performed at [facility] [address] [place], [place] [address]  Acetaminophen level     Status: Abnormal   Collection Time: 10/26/20  9:52 PM  Result Value Ref Range   Acetaminophen (Tylenol), Serum <10 (L) 10 - 30 ug/mL    Comment: (NOTE) Therapeutic concentrations vary significantly. A range of 10-30 ug/mL  may be an effective concentration for many patients. However, some  are best treated at concentrations outside of this range. Acetaminophen concentrations >150 ug/mL at 4 hours after ingestion  and >50 ug/mL at 12 hours after ingestion are often associated with  toxic reactions.  Performed at [facility], [place] [address], [place], [place] [address]   Comprehensive metabolic panel     Status: Abnormal   Collection Time: 10/26/20  9:52 PM  Result Value Ref Range   Sodium 134 (L) 135 - 145 mmol/L   Potassium 4.3 3.5 - 5.1 mmol/L   Chloride 98 98 - 111 mmol/L   CO2 25 22 - 32 mmol/L   Glucose, Bld 109 (H) 70 - 99 mg/dL    Comment: Glucose reference range applies only to samples taken after fasting for at least 8 hours.   BUN 15 8 - 23 mg/dL   Creatinine, Ser 0.89 0.44 - 1.00 mg/dL   Calcium 10.0 8.9 - 10.3 mg/dL   Total Protein 7.1 6.5 - 8.1 g/dL   Albumin 4.3 3.5 - 5.0 g/dL   AST 25 15 - 41 U/L   ALT 21 0 - 44 U/L   Alkaline Phosphatase 146 (H) 38 - 126 U/L   Total Bilirubin 1.1 0.3 - 1.2 mg/dL   GFR, Estimated >60 >60 mL/min    Comment: (NOTE) Calculated using the CKD-EPI Creatinine Equation (2021)    Anion gap 11 5 - 15    Comment: Performed at [facility] [address] [place], [place] [address]    Medications:  Current Facility-Administered Medications  Medication Dose Route Frequency Provider Last Rate Last Admin  . acetaminophen (TYLENOL) tablet 500 mg  500 mg Oral Q6H PRN Larene Pickett, PA-C      . atenolol (TENORMIN) tablet 25 mg  25 mg Oral BID Larene Pickett, PA-C   25 mg at 10/26/20 2332  . atorvastatin (LIPITOR) tablet 80 mg   80 mg Oral Daily Quincy Carnes M, PA-C      . mirtazapine (REMERON SOL-TAB) disintegrating tablet 15 mg  15 mg Oral QHS Quincy Carnes M, PA-C      . QUEtiapine (SEROQUEL) tablet 25 mg  25 mg Oral QHS Quincy Carnes M, PA-C      . sertraline (ZOLOFT) tablet 50 mg  50 mg Oral Daily Quincy Carnes M, PA-C      . ticagrelor [facility])  tablet 90 mg  90 mg Oral BID Larene Pickett, PA-C   90 mg at 10/26/20 2332   Current Outpatient Medications  Medication Sig Dispense Refill  . acetaminophen (TYLENOL) 500 MG tablet Take 500 mg by mouth every 6 (six) hours as needed for moderate pain.     [name] Kitchen atenolol (TENORMIN) 25 MG tablet Take 1 tablet (25 mg total) by mouth 2 (two) times daily.    [name] Kitchen atorvastatin (LIPITOR) 80 MG tablet TAKE ONE TABLET BY MOUTH EVERY DAY AT 6PM (Patient taking differently: Take 80 mg by mouth every evening.) 30 tablet 5  . BRILINTA 90 MG TABS tablet TAKE ONE TABLET TWICE DAILY (Patient taking differently: Take 90 mg by mouth 2 (two) times daily.) 60 tablet 2  . cholecalciferol (VITAMIN D) 1000 UNITS tablet Take 1,000 Units by mouth daily.     [name] Kitchen ipratropium (ATROVENT) 0.06 % nasal spray one spray per nostril twice daily (up to three times daily) to help with runny nose (Patient taking differently: Place 1 spray into both nostrils 3 (three) times daily as needed for rhinitis.) 15 mL 5  . loratadine (CLARITIN) 10 MG tablet Take 10 mg by mouth daily as needed for allergies.    . mirtazapine (REMERON SOL-TAB) 15 MG disintegrating tablet Take 1 tablet (15 mg total) by mouth at bedtime. 30 tablet 1  . polyethylene glycol powder (GLYCOLAX/MIRALAX) 17 GM/SCOOP powder Take 17 g by mouth daily as needed for mild constipation.    . QUEtiapine (SEROQUEL) 25 MG tablet Take 1 tablet (25 mg total) by mouth at bedtime. 30 tablet 1  . sertraline (ZOLOFT) 50 MG tablet Take one half table 25 mg for 7 days, then one whole tablet. (Patient taking differently: Take 50 mg by mouth daily.) 30 tablet 0  .  hydrOXYzine (ATARAX/VISTARIL) 10 MG tablet TAKE 1/2-1 TABLET BY MOUTH 3 TIMES DAILYAS NEEDED FOR ANXIETY (SEDATION CAUTION) (Patient not taking: No sig reported) 30 tablet 2  . montelukast (SINGULAIR) 10 MG tablet Stop as of 09/27/20 (Patient not taking: No sig reported)      Musculoskeletal: unable to fully assess via telepsych visit.  Patient seen laying in bed.    Presentation  General Appearance: Well Groomed  Eye Contact:Good  Speech:Clear and Coherent; Normal Rate  Speech Volume:Normal  Handedness:No data recorded  Mood and Affect  Mood:Anxious; Depressed  Affect:Labile   Thought Process  Thought Processes:Irrevelant  Descriptions of Associations:Circumstantial  Orientation:Full (Time, Place and Person)  Thought Content:Delusions; Illogical; Rumination  History of Schizophrenia/Schizoaffective disorder:No  Duration of Psychotic Symptoms:Less than six months  Hallucinations:Hallucinations: None  Ideas of Reference:Delusions; Paranoia; Percusatory  Suicidal Thoughts:Suicidal Thoughts: yes  Homicidal Thoughts:Homicidal Thoughts: No   Sensorium  Memory:Immediate Good; Recent Fair; Remote Fair  Judgment:Impaired  Insight:Lacking   Executive Functions  Concentration:Fair  Attention Span:Fair  [place]  Language:Good   Psychomotor Activity  Psychomotor Activity:Psychomotor Activity: Normal   Assets  Assets:Communication Skills; Housing; Social Support   Sleep  Sleep:Sleep: Fair Number of Hours of Sleep: 4   Physical Exam: Physical Exam HENT:     Head: Normocephalic.     Nose: Nose normal.  Cardiovascular:     Rate and Rhythm: Normal rate.     Pulses: Normal pulses.  Pulmonary:     Effort: Pulmonary effort is normal.  Musculoskeletal:        General: Normal range of motion.     Cervical back: Normal range of motion.  Neurological:  Mental Status: She is alert and oriented to person, place, and  time.    Review of Systems  Constitutional: Negative.   HENT: Negative.   Eyes: Negative.   Respiratory: Negative.   Musculoskeletal: Negative.   Skin: Negative.   Neurological: Negative for dizziness, loss of consciousness, weakness and headaches.  Endo/Heme/Allergies: Negative.   Psychiatric/Behavioral: Positive for depression and suicidal ideas.   Blood pressure (!) 129/93, pulse 84, temperature 98 F (36.7 C), temperature source Oral, resp. rate 18, height 4\' 11"  (1.499 m), weight 65 kg, SpO2 100 %. Body mass index is 28.94 kg/m.  Treatment Plan Summary: 80 year old female patient who is delusional and paranoid, has impaired judgement and no insight posing safety concerns.  She lives at a assistive living facility but essentially care for herself and self administers medications.  Based on her presentation, and suicidal ideations, it does not appear she can adequately care for herself at this time.  Daily contact with patient to assess and evaluate symptoms and progress in treatment and Medication management   Restarted home medications.   Disposition: Recommend psychiatric Inpatient admission when medically cleared.  This service was provided via telemedicine using a 2-way, interactive audio and video technology.  Names of all persons participating in this telemedicine service and their role in this encounter. Name: [name] [name] Role: Patient   Name: Duard Brady ills Role: Ferdinand, NP 10/27/2020 1:55 PM

## 2020-10-27 NOTE — ED Notes (Addendum)
Refused EKG, refused vital signs.

## 2020-10-27 NOTE — ED Notes (Signed)
Pt reports coming here from the behavioral center pt unsure as to why. Pt denies any si or hi.Pt reports wanting to go home

## 2020-10-27 NOTE — ED Notes (Signed)
Pt wanded by security clear

## 2020-10-27 NOTE — ED Notes (Signed)
Daughter updated on pt status and efforts for placement.

## 2020-10-27 NOTE — ED Notes (Signed)
Attempted to get pts vitals and a EKG.pt refused Stating this is is a scam and we are actors and hired [place] to do this to her. Also stating she wants to see her daughter and she should have already been her.

## 2020-10-27 NOTE — ED Provider Notes (Signed)
0845: RN Santiago Glad notified me patient continues to refuse EKG that was ordered last night and does not want to take her medicines. Requesting to see her daughter and states everything here is staged.  I evaluated the patient. She is laying in bed comfortable, awake. Does not appear particularly anxious. Speech is clear, no pressured speech. Explained to her need for EKG and medicines.  She states "there is not way you are getting an EKG" and "because I know all of this is staged".  Per EMT she will be moved to a hall bed.  Given that patient is otherwise stable, not trying to get out of bed, etc will hold off on any BZD, antipsychotics at this time until psych evaluates her.     Kinnie Feil, PA-C 10/27/20 7741    Gareth Morgan, MD 10/27/20 [phone]

## 2020-10-27 NOTE — Progress Notes (Signed)
Per [name] [name], patient meets criteria for inpatient treatment. There are no available or appropriate beds at [facility] today. CSW faxed referrals to the following facilities for review:  [facility]  TTS will continue to seek bed placement.  Glennie Isle, MSW, [place], LCAS-A Phone: [phone] Disposition/TOC

## 2020-10-27 NOTE — ED Notes (Signed)
Items inventoried in locker 13

## 2020-10-27 NOTE — ED Notes (Signed)
Breakfast tray ordered 

## 2020-10-28 NOTE — ED Provider Notes (Signed)
Patient was seen by TTS with her daughter, daughter would like to take her mom home. Plan is for daughter to take patient home, plan is for daughter to provide care pending home health. Continue with Seroquel, follow-up with neuro as previously scheduled.   Tacy Learn, PA-C 10/28/20 1305    Varney Biles, MD 10/28/20 [phone]

## 2020-10-28 NOTE — ED Notes (Signed)
TTS in use at this time.

## 2020-10-28 NOTE — ED Notes (Signed)
Breakfast Ordered 

## 2020-10-28 NOTE — ED Notes (Signed)
When pt was approached for getting into a room to speak with TTS, pt laid in the bed & stated she will play this game as long as we do & said she wanted to rest. After pt's bed was moved to a private room TTS was told that her daughter was wanting to bring pt home, EDP Nan[place]ati aware as well. TTS is ready at this time.

## 2020-10-28 NOTE — Discharge Instructions (Addendum)
Take Seroquel as discussed with behavioral health.  Monitor patient 24/7 until home health services are established. Patient is not to be left alone. Patient has an appointment with neuro provider 11/05/2020 already established.

## 2020-10-28 NOTE — ED Notes (Signed)
Pt's daughter [name] [phone]) would like to speak with Dr.

## 2020-10-28 NOTE — ED Notes (Signed)
Pt needing frequent redirection to stay within the vicinity of her bed, she was given cloth to fold & coloring sheets to stay occupied. Pt is appeased at this time.

## 2020-10-28 NOTE — ED Notes (Signed)
[name] [name], [place] approved this RN to allow pt's daughter to come back & speak along side of pt to TTS. Aleda Grana, counselor has been notified they are in room ready for TTS.

## 2020-10-28 NOTE — BH Assessment (Signed)
Requested nursing to set up TTS machine for patient's re-assessment.

## 2020-10-28 NOTE — ED Notes (Signed)
Pt very confused did not know where she was

## 2020-10-28 NOTE — ED Notes (Addendum)
Merlyn Lot, NP advised for pt to continue all meds and follow up with prescribing providers.

## 2020-10-28 NOTE — ED Notes (Signed)
Pt assisted in bathroom, given mesh briefs and 5 sanitary napkins. Pt stated she didn't want to lay on the bed so I provided a chair and warm blankets.

## 2020-10-28 NOTE — BH Assessment (Addendum)
Reassessment: 10/28/2020  Clinician contacted nursing and requested the TTS machine to be placed in patient's room for reassessment.   Prior to reassessment Clinician completed a chart review noting: "She has been paranoid and thinks people there, at her church, and in the community are "out to get her".  She has been increasingly anxious.  She did have some medication adjustments in may at her OP psychiatrists office.  Daughter had some concerns over statements made yesterday while talking with patient and she voiced "well maybe ill just do something" when speaking about being a burden and a "bad mother" to her daugther.  Patient does not elaborate on this further."  Clinician met with patient today. Her daughter was present and asked that she remain present during the assessment today.   Patient asked by this Clinician about the events leading to the hospital visit. Her response was "I came in because of strange thoughts about things", "I feel like everyone is out to get me and I still feel that way, "They sent out a mass e-mail" and "They said things we have said.... that I thought were private messages esposing use to the church". Clinician sought clarification regarding patient's references to the term "They". Patient states that "they" are all the people that are after her sending letters out to "everyone". People believes that someone is trying to confuse her by moving her around in the hospital. Pattient sates she has been harassed for several months. She lives alone. However, feels that "they" are after her at home and when she is in the community.   Denies current suicidal ideations. Patient denies she has ever stated or felt suicidal. Patient reporting a history of depression. Current depressive symptoms consist of  isolating self for others, hopeless due to her on-going confusion, anger/irritable, loss of intersest in usual pleasures. Clinician asked patient about her sleep routine and stated,  "They gave me medication to "[facility]wen[facility] Found me" so I have to rest a lot". She wakes up to vivid nightmares non memory of them when she finally wakes up during the day.  Patient says that she has visual hallucinations throughout the night.   Patient is typically a very social. However, patient experiencing severe social anxiety.  Denies HI. Denies history of alcohol and/or drugs. No history of abuse and/or trauma. Denies history of alcohol and/or drugs. No history of abuse and/or trauma.   The daughter at bedside states that she is concerned about medication changes that occurred March 2022. During the time of the medication changes, patient subsequently started displaying personality changes, anxiety, and psychotic symptoms. . Started on Seroquel and Remeron March 2022. Also, Setraline was started. The daughter is questioning if the medications are making her symptoms worse. 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  Discharge:Per Rojelio Brenner, NP, patient is psych cleared. She is ok to discharge home with her daughter. We have discussed in length discharge plans...patient's daughter will monitor patient 24/7 until home health services are established. Patient is not to be left alone. Patient has an appointment with [facility] provider 11/05/2020 already established. The daughter will also follow up with her PCP regarding medication needs.

## 2020-10-29 ENCOUNTER — Telehealth: Payer: Self-pay | Admitting: Behavioral Health

## 2020-10-29 ENCOUNTER — Ambulatory Visit (INDEPENDENT_AMBULATORY_CARE_PROVIDER_SITE_OTHER): Payer: Medicare HMO

## 2020-10-29 DIAGNOSIS — I639 Cerebral infarction, unspecified: Secondary | ICD-10-CM | POA: Diagnosis not present

## 2020-10-29 NOTE — Telephone Encounter (Signed)
[name]'s phone # is [phone]

## 2020-10-29 NOTE — Telephone Encounter (Signed)
Daughter, [name], called to report that her Mother, [name] [name], was admitted to [facility] over the weekend.  She has been released but they changed her medication.  Please call [name] to review these changes.  Also, she needs a referral to [facility] for dementia and/or vascular dementia.  They require a referral to see a Dr. Parks Ranger treats dementia.

## 2020-10-29 NOTE — Telephone Encounter (Signed)
Please review and let me know if you need me to call her

## 2020-10-29 NOTE — Telephone Encounter (Signed)
Called and consulted with [name], patients daughter to discuss current mental status and medical condition of patient. No further action is needed at this time.

## 2020-10-30 ENCOUNTER — Telehealth: Payer: Self-pay | Admitting: Family Medicine

## 2020-10-30 DIAGNOSIS — Z8673 Personal history of transient ischemic attack (TIA), and cerebral infarction without residual deficits: Secondary | ICD-10-CM

## 2020-10-30 NOTE — Telephone Encounter (Signed)
[name] called in wanted to know about getting a referral for her mom to a neurologist for dementia. She was in the hospital this past weekend for psych issues and she is declining really fast. She is going from independent living to needing care around the clock.  She wants her to go to [facility].

## 2020-10-30 NOTE — Telephone Encounter (Signed)
[name] [name] has been notified that referral was done.

## 2020-10-30 NOTE — Addendum Note (Signed)
Addended by: Tonia Ghent on: 10/30/2020 01:47 PM   Modules accepted: Orders

## 2020-10-30 NOTE — Telephone Encounter (Signed)
I put in the referral.  Thanks.  

## 2020-10-30 NOTE — Telephone Encounter (Signed)
Spoke with [name] and she states patient is already established at [facility] neurolog[facility] but they need a new referral in order to see her for dementia.

## 2020-10-31 LAB — CUP PACEART REMOTE DEVICE CHECK
Date Time Interrogation Session: 20220608094530
Implantable Pulse Generator Implant Date: 20220126

## 2020-11-05 ENCOUNTER — Ambulatory Visit: Payer: Medicare HMO | Admitting: Neurology

## 2020-11-05 ENCOUNTER — Encounter: Payer: Self-pay | Admitting: Neurology

## 2020-11-05 VITALS — BP 131/86 | HR 76 | Ht 59.0 in | Wt 127.9 lb

## 2020-11-05 DIAGNOSIS — R413 Other amnesia: Secondary | ICD-10-CM | POA: Diagnosis not present

## 2020-11-05 DIAGNOSIS — Z113 Encounter for screening for infections with a predominantly sexual mode of transmission: Secondary | ICD-10-CM | POA: Diagnosis not present

## 2020-11-05 DIAGNOSIS — G3184 Mild cognitive impairment, so stated: Secondary | ICD-10-CM

## 2020-11-05 DIAGNOSIS — I6990 Unspecified sequelae of unspecified cerebrovascular disease: Secondary | ICD-10-CM

## 2020-11-05 NOTE — Patient Instructions (Signed)
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  I recommend further evaluation with checking memory panel labs and EEG.  I discussed memory compensation strategies and encouraged her to increase participation in cognitive challenging activities like solving crossword puzzles, playing bridge and sudoku.  Continue Brilinta for secondary stroke prevention with aggressive risk factor modification strict control of hypertension with blood pressure goal below 130/90, lipids with LDL cholesterol goal below 70 mg percent and diabetes with hemoglobin A1c goal below 6.5%.  She will continue follow-up with [facility] for her anxiety and paranoia.  She will return for follow-up in the future in 3 months with [facility] if necessary. Memory Compensation Strategies  Use "WARM" strategy.  W= write it down  A= associate it  R= repeat it  M= make a mental note  2.   You can keep a Social worker.  Use a 3-ring notebook with sections for the following: calendar, important names and phone numbers,  medications, doctors' names/phone numbers, lists/reminders, and a section to journal what you did  each day.   3.    Use a calendar to write appointments down.  4.    Write yourself a schedule for the day.  This can be placed on the calendar or in a separate section of the Memory Notebook.  Keeping a  regular schedule can help memory.  5.    Use medication organizer with sections for each day or morning/evening pills.  You may need help loading it  6.    Keep a basket, or pegboard by the door.  Place items that you need to take out with you in the basket or on the pegboard.  You may also want to  include a message board for reminders.  7.    Use sticky notes.  Place sticky notes with reminders in a place where the task is  performed.  For example: " turn off the  stove" placed by the stove, "lock the door" placed on the door at eye level, " take your medications" on  the bathroom mirror or by the place where you normally take your medications.  8.    Use alarms/timers.  Use while cooking to remind yourself to check on food or as a reminder to take your medicine, or as a  reminder to make a call, or as a reminder to perform another task, etc.

## 2020-11-05 NOTE — Progress Notes (Signed)
[facility] [address] [place]. [place] [address] [phone]       STROKE FOLLOW UP NOTE  [name] Date of Birth:  03/20/1941 Medical Record Number:  [id number]   Reason for Referral: stroke follow up    SUBJECTIVE:   CHIEF COMPLAINT:  Chief Complaint  Patient presents with   Follow-up    Routine visit Room 9, daughter [name] in room    HPI:  Update 11/05/2020: She returns for follow-up after last visit with [place] practitioner 6 weeks ago.  She is accompanied by her daughter.  Their main complaint today is her memory loss which she has had since stroke.  Her short-term memory is poor.  She is also continues to have problems with anxiety and paranoia.  She is currently being managed by [facility] psychiatry.  She initially tried Lexapro and Zoloft without benefit.  She is currently taking Seroquel at night but mostly to help her sleep.  She still having some mild anxiety and paranoid ideation.  She continues to have mild weakness in the right leg as well as left hand digits getting better.  She is tolerating Brilinta well without any bruising.  Blood pressures well controlled today it is 131/86.  She is tolerating Lipitor well without muscle aches and pains.  Patient is having short-term memory difficulties ever since her stroke which is not getting better.  Her daughter started helping her do her bills and finances.  She still lives alone and is independent.  She has not had any work-up for reversible causes of memory loss.  Prior visit, 09/12/2020, Franky Macho, NP ) [name] returns for acute visit regarding new onset symptoms accompanied by her daughter  Approx 1 month ago, she suffered a fall after her right leg gave out.  She was fully evaluated in ED who felt likely due to chronic knee pain but per family request, CT head and MRI brain completed which was unremarkable for acute stroke.  Since that time, daughter has noticed worsening  memory with increased times of confusion, forgetfulness and unsteadiness. Per daughter, symptoms onset around the time aspirin was discontinued.  Patient has chronic history of low back pain previously seen by [name] in 10/2019 but no recent follow-up as this was relatively stable but over the past month, pain has been more consistent and more frequent episodes of bilateral hip weakness sensation and right leg weakness typically occurring after prolonged ambulation.  She does have scheduled visit with [name] on 4/28 to undergo nerve block injections.   She does continue to live independently but her daughter checks on her routinely and assists with IADLs.  She has been greatly struggling with anxiety over the past month.  She does have history of anxiety but recently greatly worsened.  PCP recently started Lexapro and recommended increasing to 15 mg daily but she is very hesitant to increase as she read online Lexapro can worsen anxiety.  She is also been struggling with insomnia as her mind will constantly wander with hydroxyzine prescribed by PCP but she denies much benefit.  She speaks of difficulty with her relationships as she has been limiting social interactions due to her low back pain and fear of her right leg giving out - she also feels like she is constantly being judged by her friends or worried about what they are thinking of her.  She is concerned if she misses a social event due to her low back pain and what  sounds like social anxiety.  She is also fearful of undergoing nerve block injections as she is on Brilinta and feels like this should be discontinued prior to injection but was told by [name] nurse assistant that that was not necessary.  Of note, seen by PCP Dr. Damita Dunnings 4/5 regarding these concerns who felt multifactorial and likely not new acute strokes.  She was also treated for shingles with Valtrex.  She has remained on Brilinta and atorvastatin without associated side  effects Blood pressure 142/86 -occasionally monitored at home and typically stable She does endorse nightly compliance with CPAP for OSA management Loop recorder has not shown atrial fibrillation thus far  No further concerns at this time      History provided for reference purposes only Update 07/04/2020 JM: [name] returns for scheduled follow-up regarding prior strokes accompanied by her daughter. She has been stable since prior visit without any additional stroke/TIA symptoms. Reports residual left hand weakness and gait impairment. She will use RW at times especially upon awakening but otherwise doesn't need AD. She denies any recent falls.  She has remained on Brilinta, aspirin and atorvastatin 80 mg daily without side effects.  78-month aspirin and Brilinta duration will be completed at the end of this month.  Blood pressure today 121/74.  Monitors at home and typically stable.  Reports ongoing compliance with CPAP followed by Dr. Rexene Alberts. Due to concern of multiple strokes etiology cryptogenic, loop recorder placed by Dr. Rayann Heman on 06/20/2020 for further long-term monitoring of atrial fibrillation.  No further concerns at this time.  Update 05/14/2020 JM: [name] returns per request to follow-up on recent hospitalizations.  She is accompanied by her daughter.  Personally reviewed recent hospitalizations with summary provided below.  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  P2 Y 12 4 showing inadequate Plavix function therefore switched to aspirin 81 mg daily and Brilinta 90 mg twice daily for 3 months and then continue on Brilinta alone.  Initially recommended discharge to CIR but due to functional improvement she was discharged home on 04/23/2020 with home health therapies.  She then presented to [facility] ED on 05/08/2020 for 1 wk  hx of intermittent LUE and LLE weakness, numbness/tingling and right-sided headache but due to wait time she left prior to full evaluation and went to [facility] ED with MRI reporting acute to early subacute infarct in the left centrum semiovale which was not consistent with presenting symptoms.  Per further review with neurologist, it was felt as though reported new infarct seen on prior MRIs therefore no evidence of new stroke.  Evaluated by neurology with note personally reviewed and felt possible cause of symptoms in setting of hypoperfusion with known intracranial arthrosclerosis and atenolol dosage decreased.  She then returned to [facility] ED yesterday, 05/13/2020, for left lower extremity heaviness, palpitations and headache.  Found to be hypertensive with BP 196/105.  No neurological deficits noted.  Symptoms resolved without intervention.  Mention of increased anxiety possibly contributing to symptoms.  CT head negative.  She was discharged back home in stable condition.  She reports continued intermittent b/l hand weakness where she will occasionally drop items and continued gait impairment currently working with [facility] PT. currently using rolling walker for ambulation.  Denies new or worsening stroke/TIA symptoms.  Remains on aspirin 81 mg daily and Brilinta without bleeding or bruising.  Remains on  atorvastatin 80 mg daily without myalgias.  Blood pressure today 128/75.  She does routinely monitor at home but she questions accuracy as her results are consistently 150s.  She does report nightly use of CPAP for OSA management.  Completed approximately 14 days of cardiac monitor which was negative for atrial fibrillation.  She questions need of ILR.  She does endorse increased anxiety regarding multiple strokes and WHEN (not if) another one will occur.  No further concerns at this time.  Update 03/01/2020 JM: Ms. Alberts returns for sooner scheduled visit per PCP request due to recent stroke accompanied by her daughter.   She presented to [facility] regional emergency room on 02/16/2020 with right-sided weakness contributing to fall.  MRI showed left frontal lobe infarcts likely from stenosis and left M2 all within the same vascular territory and was not felt to be embolic.  MRA head showed progression of stenosis within the distal A2/proximal artery which is now severe with additional intracranial atherosclerotic stenosis without interval change including severe focal stenosis within the cavernous left ICA, moderate focal stenosis within the cavernous right ICA, moderate stenosis with A2 right ACA and moderate stenosis within the proximal P2 left PCA.  2D echo showed EF of 60 to 65%.  On aspirin PTA and recommended DAPT " but long-term she may need Effient instead of Plavix or Brilinta".  Recommended to check Plavix assays outpatient to ensure adequate response.  HTN stable.  LDL 54 recommended continuation of atorvastatin 80 mg daily.  She did have right knee pain secondary to fall and recommended follow-up with orthopedics outpatient.  Evaluated by therapy initially recommended [facility] PT/OT but improved prior to discharge with [facility] therapy no longer indicated.  She was discharged home in stable condition and advised to follow-up with PCP, neurology and orthopedics.    She has been stable since returning home without new or reoccurring stroke/TIA symptoms.  Denies residual right-sided symptoms but does continue to have chronic right knee pain and ankle pain post fall which has been limiting her ambulation.  She is currently working with [facility] PT and ambulating with rolling walker due to right leg pain.  Continues to have mild dysarthria and decreased LUE dexterity which has been stable after recent stroke and improvement since prior visit.  She has continued on aspirin and Plavix without bleeding or bruising.  Remains on atorvastatin 80 mg daily without myalgias.  Blood pressure today satisfactory at 112/69.  She reports intermittent use of  CPAP for sleep apnea management with difficulty tolerating due to increased pressure.  Reports undergoing sleep study over 10 years ago and has not had any repeat study or follow-up to ensure adequate management of apnea.  Multiple questions regarding recent stroke and etiology as well as recommended Plavix lab work Nurse, children's) as recommended by [employer] neurologist.  Further reviewed recent admission and imaging with Dr. Leonie Man due to concern of stroke locations and possible embolic etiology. Per Dr. Leonie Man, he does not believe recent stroke was due to large vessel disease but more so embolic secondary to unknown source as her recent imaging showed infarcts within different vascular locations including ACA and MCA.  Also reviewed imaging from prior stroke in 06/2019 and due to size, potentially embolic. He recommends further cardiac evaluation with placement of loop recorder to assess for possible atrial fibrillation as stroke etiology.  No indication for TEE. He recommends aspirin and Plavix for only 3-week duration and then Plavix alone.  No indication for P2 Y 12 testing as  she has no history or evidence of Plavix failure or concern.  Update 01/02/2020 JM: Ms. Ferrick is being seen for stroke follow-up previously followed through [facility] research participating in [place] stroke trial. Evaluated during stroke trial on 10/10/2019 recently completing [place] therapies with residual deficits of mild decrease left hand dexterity, imbalance and dysarthria therefore additional orders placed for outpatient therapies.  She does report ongoing improvement and continues to work with neuro rehab PT/OT/ST for residual dysarthria and dysphonia, gait impairment/imbalance and LUE incoordination. Evaluated in [place] by Dr. 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  Reports continued participation SLP with [facility] Reports imbalance greatest difficulty still with improvement but  also underlying chronic lower back, bilateral hip and knee pain.  She wishes to proceed with additional injections and nerve blocks therefore withdrew from research study approximately 1 month ago.  Continues to follow with [facility] She remains on aspirin 81mg  daily without bleeding or bruising. Continues on atorvastatin 80 mg daily without myalgias.  Recent lipid panel 10/13/2019 showed LDL 64.  Blood pressure today 116/70.  No further concerns.  Stroke admission 07/11/2019 Personally reviewed recent hospitalization pertinent notes, labs and imaging Ms. LIZMARY NADER is a 80 y.o. female with history of HTN, HLD, obesity who presented on 07/11/2019 with dysarthria, L facial droop, mild L hemiparesis as well as hypertensive urgency.  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  Recommended DAPT for 3 weeks and aspirin alone.  BP upon arrival 202/110 stabilized during admission and recommended long-term BP goal normotensive range.  LDL 130 and initiate atorvastatin 80 mg daily. Other stroke risk factors include advanced age, EtOH use, OSA on CPAP and family reported hx imaging with evidence of small AAA.  Evaluated by therapy and recommend discharge to CIR for ongoing therapy needs but unfortunately did not by insurance therefore discharged home with home health therapy.  Also advised to follow-up with [facility] research is interested in [employer] stroke trial.   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  MRI  R corona radiata and caudate head small vessel disease. infarcts CTA head & neck negative LVO, negative arthrosclerosis or stenosis in neck, positive for intracranial arthrosclerosis with significant stenosis within moderate to severe left ICA anterior genu, moderate bilateral ICA distally to and moderate left PCA P1 2D Echo  EF 50 to 55% LDL 130 HgbA1c 5.5 Lovenox 40 mg sq daily for VTE  prophylaxis No antithrombotic prior to admission, now on clopidogrel 75 mg daily and aspirin 324 mg. Change aspirin to 81. Continue DAPT x 3 weeks then aspirin alone.  Therapy recommendations: [facility] -insurance denied therefore recommended HH therapy Disposition: Home Recommend aspirin and Plavix for 3 weeks followed by aspirin alone. She is  participating in the [employer] stroke prevention trial( standard of care antiplatelets and Factor xi inhibitor versus standard of care antiplates and placebo)         ROS:   14 system review of systems performed and negative with exception of see HPI   PMH:  Past Medical History:  Diagnosis Date   Arthritis    knees   Depression    Fatty liver    GERD (gastroesophageal reflux disease)    Hyperlipidemia    Hypertension    IBS (irritable bowel syndrome)    Obesity    Pneumonia    Sleep apnea  uses CPAP   Stroke (cerebrum) (HCC)     PSH:  Past Surgical History:  Procedure Laterality Date   BLADDER SURGERY     BREAST SURGERY     breast biopsy   BROW LIFT Bilateral 04/14/2017   Procedure: BLEPHAROPLASTY UPPER EYELID WITH EXCESS SKIN;  Surgeon: Karle Starch, MD;  Location: [place];  Service: Ophthalmology;  Laterality: Bilateral;   CATARACT EXTRACTION W/PHACO Left 02/05/2016   Procedure: CATARACT EXTRACTION PHACO AND INTRAOCULAR LENS PLACEMENT ([place]);  Surgeon: Birder Robson, MD;  Location: [facility];  Service: Ophthalmology;  Laterality: Left;  [place] 01:10 AP% 22.3 CDE 15.67 Fluid pack lot # [id number]   CATARACT EXTRACTION W/PHACO Right 02/26/2016   Procedure: CATARACT EXTRACTION PHACO AND INTRAOCULAR LENS PLACEMENT ([facility]);  Surgeon: Birder Robson, MD;  Location: [facility];  Service: Ophthalmology;  Laterality: Right;  [place] 57.4 AP% 24.0 CDE 13.75 Fluid Pack lot # [id number]   CHOLECYSTECTOMY     COLONOSCOPY WITH PROPOFOL N/A 12/18/2014   Procedure: COLONOSCOPY WITH PROPOFOL;  Surgeon: Manya Silvas, MD;  Location: [facility]  ENDOSCOPY;  Service: Endoscopy;  Laterality: N/A;   DEEP NECK LYMPH NODE BIOPSY / EXCISION     implantable loop recorder implantation  06/20/2020   Medtronic Reveal Linq model LNQ 22 ([place] [id number]) implantable loop recorder for cryptogenic stroke   JOINT REPLACEMENT     KNEE ARTHROPLASTY Left 06/20/2015   Procedure: COMPUTER ASSISTED TOTAL KNEE ARTHROPLASTY;  Surgeon: Dereck Leep, MD;  Location: [facility];  Service: Orthopedics;  Laterality: Left;   PTOSIS REPAIR Bilateral 04/14/2017   Procedure: PTOSIS REPAIR RESECT EX;  Surgeon: Karle Starch, MD;  Location: [employer];  Service: Ophthalmology;  Laterality: Bilateral;  sleep apnea   TONSILLECTOMY     TOTAL HIP ARTHROPLASTY Right 07/21/2018   Procedure: TOTAL HIP ARTHROPLASTY ANTERIOR APPROACH;  Surgeon: Gaynelle Arabian, MD;  Location: [facility];  Service: Orthopedics;  Laterality: Right;    Social History:  Social History   Socioeconomic History   Marital status: Widowed    Spouse name: Not on file   Number of children: Not on file   Years of education: Not on file   Highest education level: Not on file  Occupational History   Occupation: retired  Tobacco Use   Smoking status: Never   Smokeless tobacco: Never  Vaping Use   Vaping Use: Never used  Substance and Sexual Activity   Alcohol use: Yes    Comment: ocassional glass of wine 2-3 times a year   Drug use: No   Sexual activity: Never  Other Topics Concern   Not on file  Social History Narrative   Lives alone in home   Right Handed   Drinks 1-2 cups caffeine daily   Social Determinants of Health   Financial Resource Strain: Not on file  Food Insecurity: Not on file  Transportation Needs: Not on file  Physical Activity: Not on file  Stress: Not on file  Social Connections: Not on file  Intimate Partner Violence: Not on file    Family History:  Family History  Problem Relation Age of Onset   Heart attack Mother    Dementia Father    Heart attack  Father 44   Aortic aneurysm Brother    Stroke Paternal Grandfather    Colon cancer Neg Hx    Breast cancer Neg Hx     Medications:   Current Outpatient Medications on File Prior to Visit  Medication Sig Dispense Refill  acetaminophen (TYLENOL) 500 MG tablet Take 500 mg by mouth every 6 (six) hours as needed for moderate pain.      atenolol (TENORMIN) 25 MG tablet Take 1 tablet (25 mg total) by mouth 2 (two) times daily.     atorvastatin (LIPITOR) 80 MG tablet TAKE ONE TABLET BY MOUTH EVERY DAY AT 6PM (Patient taking differently: Take 80 mg by mouth every evening.) 30 tablet 5   BRILINTA 90 MG TABS tablet TAKE ONE TABLET TWICE DAILY (Patient taking differently: Take 90 mg by mouth 2 (two) times daily.) 60 tablet 2   cholecalciferol (VITAMIN D) 1000 UNITS tablet Take 1,000 Units by mouth daily.      ipratropium (ATROVENT) 0.06 % nasal spray one spray per nostril twice daily (up to three times daily) to help with runny nose (Patient taking differently: Place 1 spray into both nostrils 3 (three) times daily as needed for rhinitis.) 15 mL 5   loratadine (CLARITIN) 10 MG tablet Take 10 mg by mouth daily as needed for allergies.     polyethylene glycol powder (GLYCOLAX/MIRALAX) 17 GM/SCOOP powder Take 17 g by mouth daily as needed for mild constipation.     QUEtiapine (SEROQUEL) 25 MG tablet Take 1 tablet (25 mg total) by mouth at bedtime. 30 tablet 1   hydrOXYzine (ATARAX/VISTARIL) 10 MG tablet TAKE 1/2-1 TABLET BY MOUTH 3 TIMES DAILYAS NEEDED FOR ANXIETY (SEDATION CAUTION) 30 tablet 2   mirtazapine (REMERON SOL-TAB) 15 MG disintegrating tablet Take 1 tablet (15 mg total) by mouth at bedtime. 30 tablet 1   montelukast (SINGULAIR) 10 MG tablet Stop as of 09/27/20     sertraline (ZOLOFT) 50 MG tablet Take one half table 25 mg for 7 days, then one whole tablet. (Patient taking differently: Take 50 mg by mouth daily.) 30 tablet 0   No current facility-administered medications on file prior to visit.     Allergies:   Allergies  Allergen Reactions   Hydrocodone Nausea Only    Noted after surgery, may be able to tolerate with food      OBJECTIVE:  Physical Exam  Vitals:   11/05/20 1245  BP: 131/86  Pulse: 76  Weight: 127 lb 14.4 oz (58 kg)  Height: 4\' 11"  (1.499 m)   Body mass index is 25.83 kg/m. No results found.  GAD 7 : Generalized Anxiety Score 09/12/2020  Nervous, Anxious, on Edge 3  Control/stop worrying 3  Worry too much - different things 3  Trouble relaxing 3  Restless 3  Easily annoyed or irritable 1  Afraid - awful might happen 3  Total GAD 7 Score 19     General: well developed, well nourished,  pleasantly anxious elderly Caucasian female, seated, in no evident distress Neck: supple with no carotid or supraclavicular bruits Cardiovascular: regular rate and rhythm, no murmurs Vascular:  Normal pulses all extremities  Neurologic Exam Mental Status: Awake and fully alert.  Fluent speech and language.  Oriented to place and time. Recent and remote memory intact. Attention span, concentration and fund of knowledge mostly appropriate but would consistently speak of the same topics. Mood and affect anxious. Diminished recall 1/3. Able to name 7 animals only which can walk on 4 legs. Clock drawing 4/4. Cranial Nerves: Pupils equal, briskly reactive to light. Extraocular movements full without nystagmus. Visual fields full to confrontation. Hearing intact. Facial sensation intact.  Tongue and palate moves normally and symmetrically.  Mild left lower facial weakness Motor: Normal bulk and tone. Normal strength in all  tested extremity muscles except mild weakness in left hand and diminished finger tapping on left Sensory.: intact to touch , pinprick , position and vibratory sensation.  Coordination: Rapid alternating movements normal in all extremities. Finger-to-nose and heel-to-shin performed accurately bilaterally.  Gait and Station: Stands from seated position  without difficulty.  Stance is normal.  Gait demonstrates relatively normal stride length with slight unsteadiness   Reflexes: 1+ and symmetric. Toes downgoing.       ASSESSMENT/PLAN: RAEVYN SOKOL is a 80 y.o. year old female with history of R MCA stroke felt secondary to small vessel disease on 07/11/2019, left frontal lobe infarct 02/16/2020 evaluated at [facility] regional felt secondary to large vessel disease vs embolic source, left ACA stroke 03/2020 likely secondary to progressive intracranial arthrosclerosis.  P2Y12 4 on Plavix.  2 additional ED evaluations 05/09/2020 and 05/13/2020 for worsening left-sided weakness and numbness without evidence of acute infarct contributing to symptoms and felt likely in setting of hypoperfusion with intracranial arthrosclerosis. Vascular risk factors include HTN, HLD, intracranial arthrosclerosis with stenosis, OSA on CPAP, EtOH use and obesity.      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  I recommend further evaluation with checking memory panel labs and EEG.  I discussed memory compensation strategies and encouraged her to increase participation in cognitive challenging activities like solving crossword puzzles, playing bridge and sudoku.  Continue Brilinta for secondary stroke prevention with aggressive risk factor modification strict control of hypertension with blood pressure goal below 130/90, lipids with LDL cholesterol goal below 70 mg percent and diabetes with hemoglobin A1c goal below 6.5%.  She will continue follow-up with [facility] psychiatry for her anxiety and paranoia.  She will return for follow-up in the future in 3 months with [place] practitioner oncology if necessary.  I spent 35 minutes of face-to-face and non-face-to-face time with patient and daughter.  This included  previsit chart review, lab review, study review, order entry, electronic health record documentation, patient and daughter prolonged discussion/education regarding recent worsening/new onset symptoms and likely etiology, history of recurrent strokes and review of loop recorder, importance of managing stroke risk factors  and memory lossand answered all other questions to patient and daughters satisfaction     Antony Contras, MD CC:   Tonia Ghent, MD     Phone [phone] Fax [phone] Note: This document was prepared with digital dictation and possible smart phrase technology. Any transcriptional errors that result from this process are unintentional.     DOB, follow-up appointment in 66months [facility] right now

## 2020-11-06 ENCOUNTER — Telehealth: Payer: Self-pay | Admitting: Neurology

## 2020-11-06 LAB — DEMENTIA PANEL
Homocysteine: 19.2 umol/L (ref 0.0–19.2)
RPR Ser Ql: NONREACTIVE
TSH: 1.4 u[IU]/mL (ref 0.450–4.500)
Vitamin B-12: 353 pg/mL (ref 232–1245)

## 2020-11-06 NOTE — Telephone Encounter (Signed)
Pt's daughter Lanice Schwab on [place] called and LVM wanting to know if her mother's results of yesterday labs have come in.

## 2020-11-09 ENCOUNTER — Other Ambulatory Visit: Payer: Self-pay | Admitting: Behavioral Health

## 2020-11-09 ENCOUNTER — Ambulatory Visit (INDEPENDENT_AMBULATORY_CARE_PROVIDER_SITE_OTHER): Payer: Medicare HMO | Admitting: Behavioral Health

## 2020-11-09 ENCOUNTER — Other Ambulatory Visit: Payer: Self-pay

## 2020-11-09 DIAGNOSIS — G4700 Insomnia, unspecified: Secondary | ICD-10-CM | POA: Diagnosis not present

## 2020-11-09 DIAGNOSIS — R4189 Other symptoms and signs involving cognitive functions and awareness: Secondary | ICD-10-CM

## 2020-11-09 DIAGNOSIS — F411 Generalized anxiety disorder: Secondary | ICD-10-CM

## 2020-11-09 DIAGNOSIS — F331 Major depressive disorder, recurrent, moderate: Secondary | ICD-10-CM

## 2020-11-09 MED ORDER — QUETIAPINE FUMARATE 50 MG PO TABS: 50.0000 mg | ORAL_TABLET | Freq: Every day | ORAL | 1 refills | Status: DC

## 2020-11-10 NOTE — Progress Notes (Signed)
Kindly inform the patient that lab work for reversible causes of memory loss was all normal

## 2020-11-11 ENCOUNTER — Encounter: Payer: Self-pay | Admitting: Behavioral Health

## 2020-11-11 NOTE — Progress Notes (Signed)
[facility]  Patient ID: [name] [name],  MRN: [id number]  PCP: Tonia Ghent, MD  Date of Evaluation: 11/11/2020 Time spent:30 minutes  Chief Complaint:  Chief Complaint   Anxiety; Depression; Altered Mental Status; Follow-up; Medication Problem; Medication Refill     HISTORY/CURRENT STATUS: HPI 80 year old female presents to this office for follow up and medication management. She is accompanied by daughter [name] with consent. Patient was guarded and restricted this visit. Daughter of patient provided most of the reliable collateral information. Pt's short and long term memory was intact this visit but she was still fixated on her previous ER visit which she believes "was staged". She also says that this visit might be staged. Daughter says that mother has had continued frequent delusions that church members are against her and talk about her. Pt says she believes she is not good enough. She continually gives praise to daughter in how good she is to her and for taking care of her. She feels like she is a burden. Daughter says that patients sleep has improved and night time is less eventful. She says that she continues to have delusions and paranoia. Pt says her anxiety is a 6/10 and depression a 5/10. Pt is sleeping 7-8 hours per night with aid of medication. She is negative for mania, she is having paranoid delusions. No SI/HI  Past failed psychiatric medications: Lexapro Zoloft  Individual Medical History/ Review of Systems: Changes? :No   Allergies: Hydrocodone  Current Medications:  Current Outpatient Medications:    acetaminophen (TYLENOL) 500 MG tablet, Take 500 mg by mouth every 6 (six) hours as needed for moderate pain. , Disp: , Rfl:    atenolol (TENORMIN) 25 MG tablet, Take 1 tablet (25 mg total) by mouth 2 (two) times daily., Disp: , Rfl:    cholecalciferol (VITAMIN D) 1000 UNITS tablet, Take 1,000 Units by mouth daily. , Disp: , Rfl:    hydrOXYzine  (ATARAX/VISTARIL) 10 MG tablet, TAKE 1/2-1 TABLET BY MOUTH 3 TIMES DAILYAS NEEDED FOR ANXIETY (SEDATION CAUTION), Disp: 30 tablet, Rfl: 2   loratadine (CLARITIN) 10 MG tablet, Take 10 mg by mouth daily as needed for allergies., Disp: , Rfl:    mirtazapine (REMERON SOL-TAB) 15 MG disintegrating tablet, Take 1 tablet (15 mg total) by mouth at bedtime., Disp: 30 tablet, Rfl: 1   montelukast (SINGULAIR) 10 MG tablet, Stop as of 09/27/20, Disp: , Rfl:    polyethylene glycol powder (GLYCOLAX/MIRALAX) 17 GM/SCOOP powder, Take 17 g by mouth daily as needed for mild constipation., Disp: , Rfl:    QUEtiapine (SEROQUEL) 25 MG tablet, Take 1 tablet (25 mg total) by mouth at bedtime., Disp: 30 tablet, Rfl: 1   QUEtiapine (SEROQUEL) 50 MG tablet, Take 1 tablet (50 mg total) by mouth at bedtime., Disp: 30 tablet, Rfl: 1   atorvastatin (LIPITOR) 80 MG tablet, TAKE ONE TABLET BY MOUTH EVERY DAY AT 6PM (Patient taking differently: Take 80 mg by mouth every evening.), Disp: 30 tablet, Rfl: 5   BRILINTA 90 MG TABS tablet, TAKE ONE TABLET TWICE DAILY (Patient taking differently: Take 90 mg by mouth 2 (two) times daily.), Disp: 60 tablet, Rfl: 2   ipratropium (ATROVENT) 0.06 % nasal spray, one spray per nostril twice daily (up to three times daily) to help with runny nose (Patient taking differently: Place 1 spray into both nostrils 3 (three) times daily as needed for rhinitis.), Disp: 15 mL, Rfl: 5   sertraline (ZOLOFT) 50 MG tablet, Take one  half table 25 mg for 7 days, then one whole tablet. (Patient not taking: Reported on 11/11/2020), Disp: 30 tablet, Rfl: 0 Medication Side Effects: none  Family Medical/ Social History: Changes? No  MENTAL HEALTH EXAM:  There were no vitals taken for this visit.There is no height or weight on file to calculate BMI.  General Appearance: Casual, Neat, and Well Groomed  Eye Contact:  Fair  Speech:  Clear and Coherent  Volume:  Normal  Mood:  Anxious and Depressed  Affect:  Flat   Thought Process:  Disorganized  Orientation:  Full (Time, Place, and Person)  Thought Content: Delusions   Suicidal Thoughts:  No  Homicidal Thoughts:  No  Memory:  WNL  Judgement:  Impaired  Insight:  Lacking  Psychomotor Activity:  Decreased  Concentration:  Concentration: Fair  Recall:  [place] of Knowledge: Good  Language: Good  Assets:  Desire for Improvement Physical Health Resilience Social Support  ADL's:  Impaired  Cognition: Impaired,  Moderate  Prognosis:  Poor    DIAGNOSES:    ICD-10-CM   1. Generalized anxiety disorder  F41.1 QUEtiapine (SEROQUEL) 50 MG tablet    2. Cognitive decline  R41.89 QUEtiapine (SEROQUEL) 50 MG tablet    3. Insomnia, unspecified type  G47.00     4. Major depressive disorder, recurrent episode, moderate (HCC)  F33.1 QUEtiapine (SEROQUEL) 50 MG tablet      Receiving Psychotherapy: No   RECOMMENDATIONS:   Increase Seroquel to 50 mg at bedtime Continue Remeron 15 mg by mouth at bedtime Continue Hydroxyzine 10 mg three times daily prn  Will report worsening symptoms, acute further decline in cognition or behaviors. Provided after hours emergency contact information Discussed potential metabolic side effects associated with atypical antipsychotics, as well as potential risk for movement side effects. Advised pt to contact office if movement side effects occur.  Greater than 50% of 30 min. face to face time with patient was spent on counseling and coordination of care. We discussed patients recent follow up with neurology. Reinforced to patient and daughter that psychotropic medication sometimes have limited response with vascular demential. According to recent imaging Pt is positive for chronic small vessel disease which lead to demential  in some patients or present with symptoms of cognitive decline. Pt was advised that therapy would include treating anxiety, depression, psychosis persisting of delusions and paranoid behaviors,  and  sleep disorder as needed. Patient and daughter understood that due to age and risk factors we have to use caution when prescribing antipsychotic medication or fall risk medications.     Elwanda Brooklyn, NP

## 2020-11-12 ENCOUNTER — Ambulatory Visit: Payer: Medicare HMO | Admitting: Neurology

## 2020-11-12 DIAGNOSIS — R413 Other amnesia: Secondary | ICD-10-CM

## 2020-11-12 DIAGNOSIS — R410 Disorientation, unspecified: Secondary | ICD-10-CM | POA: Diagnosis not present

## 2020-11-12 DIAGNOSIS — G3184 Mild cognitive impairment, so stated: Secondary | ICD-10-CM

## 2020-11-13 ENCOUNTER — Telehealth: Payer: Self-pay | Admitting: Emergency Medicine

## 2020-11-16 ENCOUNTER — Ambulatory Visit: Payer: Medicare HMO | Admitting: Behavioral Health

## 2020-11-19 ENCOUNTER — Telehealth: Payer: Self-pay | Admitting: *Deleted

## 2020-11-19 NOTE — Telephone Encounter (Signed)
I spoke to her daughter on DPR and provided her with the EEG results.

## 2020-11-19 NOTE — Progress Notes (Signed)
Carelink Summary Report / Loop Recorder 

## 2020-11-19 NOTE — Progress Notes (Signed)
Kindly inform the patient that EEG study was normal

## 2020-11-19 NOTE — Telephone Encounter (Signed)
-----   Message from Garvin Fila, MD sent at 11/19/2020  5:07 PM EDT ----- Mitchell Heir inform the patient that EEG study was normal

## 2020-11-27 ENCOUNTER — Telehealth: Payer: Self-pay | Admitting: Neurology

## 2020-11-27 NOTE — Telephone Encounter (Signed)
Pt's daughter(on DPR) is asking for a call to discuss concerns she has about pt showing signs of dementia

## 2020-11-27 NOTE — Telephone Encounter (Signed)
I returned the call to her daughter. She was recently seen here on 11/05/20. Memory loss was evaluated at this visit (MMSE 19/30). Additionally, she was instructed to continue follow up with her psychiatrist. Her daughter wanted it noted that her memory has continued to decline with some days being better than others. The patient was instructed to follow up in three months to be closely monitored. Her daughter schedule that appt today while we were on the phone. She will also make sure to provide her with mentally stimulating activities and keeping her well hydrated.

## 2020-11-27 NOTE — Telephone Encounter (Signed)
Left message for a return call

## 2020-11-29 ENCOUNTER — Ambulatory Visit (INDEPENDENT_AMBULATORY_CARE_PROVIDER_SITE_OTHER): Payer: Medicare HMO

## 2020-11-29 DIAGNOSIS — I639 Cerebral infarction, unspecified: Secondary | ICD-10-CM

## 2020-12-03 LAB — CUP PACEART REMOTE DEVICE CHECK
Date Time Interrogation Session: 20220711094518
Implantable Pulse Generator Implant Date: 20220126

## 2020-12-05 ENCOUNTER — Telehealth: Payer: Self-pay | Admitting: Behavioral Health

## 2020-12-05 NOTE — Telephone Encounter (Signed)
Daughter called and said that the new medicine worked great for a week. Now her mom is doing progressively worse. She thinks it must be the medicine. Please call the daughter to discuss at [phone]

## 2020-12-06 NOTE — Telephone Encounter (Signed)
Daughter stated when you increased the Seroquel she was doing very well with great memory,but now she is not doing well.She has increased paranoia,angry,and memory problems.She does not trust her kids and thinks her sons are russian spies.She thinks this may be a reaction to the Seroquel.

## 2020-12-06 NOTE — Telephone Encounter (Signed)
Spoke with daughter [name]. We are going to try increase dose of Seroquel by administering daytime dose in am of 25 mg. Daughter will call back if condition worsens. She has appt next Friday. No further action needed.

## 2020-12-11 DIAGNOSIS — E872 Acidosis: Secondary | ICD-10-CM | POA: Diagnosis not present

## 2020-12-11 DIAGNOSIS — F411 Generalized anxiety disorder: Secondary | ICD-10-CM | POA: Diagnosis not present

## 2020-12-11 DIAGNOSIS — F32A Depression, unspecified: Secondary | ICD-10-CM | POA: Diagnosis not present

## 2020-12-11 DIAGNOSIS — F0150 Vascular dementia without behavioral disturbance: Secondary | ICD-10-CM | POA: Diagnosis not present

## 2020-12-11 DIAGNOSIS — N390 Urinary tract infection, site not specified: Secondary | ICD-10-CM | POA: Diagnosis not present

## 2020-12-11 DIAGNOSIS — G4700 Insomnia, unspecified: Secondary | ICD-10-CM | POA: Diagnosis not present

## 2020-12-11 DIAGNOSIS — F39 Unspecified mood [affective] disorder: Secondary | ICD-10-CM | POA: Diagnosis not present

## 2020-12-11 DIAGNOSIS — F331 Major depressive disorder, recurrent, moderate: Secondary | ICD-10-CM | POA: Diagnosis not present

## 2020-12-11 DIAGNOSIS — R4189 Other symptoms and signs involving cognitive functions and awareness: Secondary | ICD-10-CM | POA: Diagnosis not present

## 2020-12-11 DIAGNOSIS — I10 Essential (primary) hypertension: Secondary | ICD-10-CM | POA: Diagnosis not present

## 2020-12-11 DIAGNOSIS — K5900 Constipation, unspecified: Secondary | ICD-10-CM | POA: Diagnosis not present

## 2020-12-11 DIAGNOSIS — F333 Major depressive disorder, recurrent, severe with psychotic symptoms: Secondary | ICD-10-CM | POA: Diagnosis not present

## 2020-12-11 DIAGNOSIS — E860 Dehydration: Secondary | ICD-10-CM | POA: Diagnosis not present

## 2020-12-11 DIAGNOSIS — S066X0A Traumatic subarachnoid hemorrhage without loss of consciousness, initial encounter: Secondary | ICD-10-CM | POA: Diagnosis not present

## 2020-12-11 DIAGNOSIS — R451 Restlessness and agitation: Secondary | ICD-10-CM | POA: Diagnosis not present

## 2020-12-11 DIAGNOSIS — R1030 Lower abdominal pain, unspecified: Secondary | ICD-10-CM | POA: Diagnosis not present

## 2020-12-11 DIAGNOSIS — R441 Visual hallucinations: Secondary | ICD-10-CM | POA: Diagnosis not present

## 2020-12-11 DIAGNOSIS — F419 Anxiety disorder, unspecified: Secondary | ICD-10-CM | POA: Diagnosis not present

## 2020-12-11 DIAGNOSIS — F0390 Unspecified dementia without behavioral disturbance: Secondary | ICD-10-CM | POA: Diagnosis not present

## 2020-12-11 DIAGNOSIS — I639 Cerebral infarction, unspecified: Secondary | ICD-10-CM | POA: Diagnosis not present

## 2020-12-11 DIAGNOSIS — R410 Disorientation, unspecified: Secondary | ICD-10-CM | POA: Diagnosis not present

## 2020-12-11 DIAGNOSIS — G479 Sleep disorder, unspecified: Secondary | ICD-10-CM | POA: Diagnosis not present

## 2020-12-11 DIAGNOSIS — I4891 Unspecified atrial fibrillation: Secondary | ICD-10-CM | POA: Diagnosis not present

## 2020-12-11 DIAGNOSIS — F22 Delusional disorders: Secondary | ICD-10-CM | POA: Diagnosis not present

## 2020-12-11 DIAGNOSIS — F0281 Dementia in other diseases classified elsewhere with behavioral disturbance: Secondary | ICD-10-CM | POA: Diagnosis not present

## 2020-12-11 DIAGNOSIS — R319 Hematuria, unspecified: Secondary | ICD-10-CM | POA: Diagnosis not present

## 2020-12-11 DIAGNOSIS — E7800 Pure hypercholesterolemia, unspecified: Secondary | ICD-10-CM | POA: Diagnosis not present

## 2020-12-11 DIAGNOSIS — Z681 Body mass index (BMI) 19 or less, adult: Secondary | ICD-10-CM | POA: Diagnosis not present

## 2020-12-11 DIAGNOSIS — F322 Major depressive disorder, single episode, severe without psychotic features: Secondary | ICD-10-CM | POA: Diagnosis not present

## 2020-12-11 DIAGNOSIS — Z742 Need for assistance at home and no other household member able to render care: Secondary | ICD-10-CM | POA: Diagnosis not present

## 2020-12-11 DIAGNOSIS — Z20822 Contact with and (suspected) exposure to covid-19: Secondary | ICD-10-CM | POA: Diagnosis not present

## 2020-12-12 DIAGNOSIS — F22 Delusional disorders: Secondary | ICD-10-CM | POA: Diagnosis not present

## 2020-12-12 DIAGNOSIS — F411 Generalized anxiety disorder: Secondary | ICD-10-CM | POA: Diagnosis not present

## 2020-12-12 DIAGNOSIS — R4189 Other symptoms and signs involving cognitive functions and awareness: Secondary | ICD-10-CM | POA: Insufficient documentation

## 2020-12-12 DIAGNOSIS — F331 Major depressive disorder, recurrent, moderate: Secondary | ICD-10-CM | POA: Diagnosis not present

## 2020-12-12 DIAGNOSIS — G4700 Insomnia, unspecified: Secondary | ICD-10-CM | POA: Diagnosis not present

## 2020-12-14 ENCOUNTER — Ambulatory Visit: Payer: Medicare HMO | Admitting: Behavioral Health

## 2020-12-14 ENCOUNTER — Telehealth: Payer: Self-pay | Admitting: Behavioral Health

## 2020-12-14 DIAGNOSIS — F22 Delusional disorders: Secondary | ICD-10-CM | POA: Diagnosis not present

## 2020-12-14 NOTE — Telephone Encounter (Signed)
Acknowledged.  Thank you.

## 2020-12-14 NOTE — Telephone Encounter (Signed)
Pt in hospital @ [facility] today

## 2020-12-15 DIAGNOSIS — F331 Major depressive disorder, recurrent, moderate: Secondary | ICD-10-CM | POA: Diagnosis not present

## 2020-12-15 DIAGNOSIS — F22 Delusional disorders: Secondary | ICD-10-CM | POA: Diagnosis not present

## 2020-12-15 DIAGNOSIS — G4700 Insomnia, unspecified: Secondary | ICD-10-CM | POA: Diagnosis not present

## 2020-12-15 DIAGNOSIS — F411 Generalized anxiety disorder: Secondary | ICD-10-CM | POA: Diagnosis not present

## 2020-12-16 DIAGNOSIS — K5900 Constipation, unspecified: Secondary | ICD-10-CM | POA: Diagnosis not present

## 2020-12-16 DIAGNOSIS — F331 Major depressive disorder, recurrent, moderate: Secondary | ICD-10-CM | POA: Diagnosis not present

## 2020-12-16 DIAGNOSIS — G4700 Insomnia, unspecified: Secondary | ICD-10-CM | POA: Diagnosis not present

## 2020-12-16 DIAGNOSIS — F22 Delusional disorders: Secondary | ICD-10-CM | POA: Diagnosis not present

## 2020-12-16 DIAGNOSIS — R451 Restlessness and agitation: Secondary | ICD-10-CM | POA: Diagnosis not present

## 2020-12-17 DIAGNOSIS — F322 Major depressive disorder, single episode, severe without psychotic features: Secondary | ICD-10-CM | POA: Diagnosis not present

## 2020-12-17 DIAGNOSIS — K5900 Constipation, unspecified: Secondary | ICD-10-CM | POA: Diagnosis not present

## 2020-12-17 DIAGNOSIS — F411 Generalized anxiety disorder: Secondary | ICD-10-CM | POA: Diagnosis not present

## 2020-12-17 DIAGNOSIS — R451 Restlessness and agitation: Secondary | ICD-10-CM | POA: Diagnosis not present

## 2020-12-18 DIAGNOSIS — F22 Delusional disorders: Secondary | ICD-10-CM | POA: Diagnosis not present

## 2020-12-18 DIAGNOSIS — S066X0A Traumatic subarachnoid hemorrhage without loss of consciousness, initial encounter: Secondary | ICD-10-CM | POA: Diagnosis not present

## 2020-12-18 DIAGNOSIS — R451 Restlessness and agitation: Secondary | ICD-10-CM | POA: Diagnosis not present

## 2020-12-18 DIAGNOSIS — G479 Sleep disorder, unspecified: Secondary | ICD-10-CM | POA: Diagnosis not present

## 2020-12-18 DIAGNOSIS — F419 Anxiety disorder, unspecified: Secondary | ICD-10-CM | POA: Diagnosis not present

## 2020-12-18 DIAGNOSIS — F32A Depression, unspecified: Secondary | ICD-10-CM | POA: Diagnosis not present

## 2020-12-18 DIAGNOSIS — K5900 Constipation, unspecified: Secondary | ICD-10-CM | POA: Diagnosis not present

## 2020-12-19 DIAGNOSIS — F39 Unspecified mood [affective] disorder: Secondary | ICD-10-CM | POA: Diagnosis not present

## 2020-12-19 DIAGNOSIS — R410 Disorientation, unspecified: Secondary | ICD-10-CM | POA: Diagnosis not present

## 2020-12-19 DIAGNOSIS — F419 Anxiety disorder, unspecified: Secondary | ICD-10-CM | POA: Diagnosis not present

## 2020-12-19 DIAGNOSIS — F22 Delusional disorders: Secondary | ICD-10-CM | POA: Diagnosis not present

## 2020-12-19 DIAGNOSIS — K5900 Constipation, unspecified: Secondary | ICD-10-CM | POA: Diagnosis not present

## 2020-12-20 DIAGNOSIS — F22 Delusional disorders: Secondary | ICD-10-CM | POA: Diagnosis not present

## 2020-12-20 DIAGNOSIS — F419 Anxiety disorder, unspecified: Secondary | ICD-10-CM | POA: Diagnosis not present

## 2020-12-20 DIAGNOSIS — R410 Disorientation, unspecified: Secondary | ICD-10-CM | POA: Diagnosis not present

## 2020-12-20 DIAGNOSIS — K5900 Constipation, unspecified: Secondary | ICD-10-CM | POA: Diagnosis not present

## 2020-12-20 DIAGNOSIS — E7800 Pure hypercholesterolemia, unspecified: Secondary | ICD-10-CM | POA: Diagnosis not present

## 2020-12-20 NOTE — Progress Notes (Signed)
Carelink Summary Report / Loop Recorder 

## 2020-12-21 DIAGNOSIS — F419 Anxiety disorder, unspecified: Secondary | ICD-10-CM | POA: Diagnosis not present

## 2020-12-21 DIAGNOSIS — F22 Delusional disorders: Secondary | ICD-10-CM | POA: Diagnosis not present

## 2020-12-21 DIAGNOSIS — F32A Depression, unspecified: Secondary | ICD-10-CM | POA: Diagnosis not present

## 2020-12-21 DIAGNOSIS — G479 Sleep disorder, unspecified: Secondary | ICD-10-CM | POA: Diagnosis not present

## 2020-12-23 DIAGNOSIS — F419 Anxiety disorder, unspecified: Secondary | ICD-10-CM | POA: Diagnosis not present

## 2020-12-23 DIAGNOSIS — F32A Depression, unspecified: Secondary | ICD-10-CM | POA: Diagnosis not present

## 2020-12-23 DIAGNOSIS — G479 Sleep disorder, unspecified: Secondary | ICD-10-CM | POA: Diagnosis not present

## 2020-12-23 DIAGNOSIS — F22 Delusional disorders: Secondary | ICD-10-CM | POA: Diagnosis not present

## 2020-12-24 DIAGNOSIS — F32A Depression, unspecified: Secondary | ICD-10-CM | POA: Diagnosis not present

## 2020-12-24 DIAGNOSIS — F22 Delusional disorders: Secondary | ICD-10-CM | POA: Diagnosis not present

## 2020-12-24 DIAGNOSIS — G479 Sleep disorder, unspecified: Secondary | ICD-10-CM | POA: Diagnosis not present

## 2020-12-24 DIAGNOSIS — F419 Anxiety disorder, unspecified: Secondary | ICD-10-CM | POA: Diagnosis not present

## 2020-12-26 DIAGNOSIS — F22 Delusional disorders: Secondary | ICD-10-CM | POA: Diagnosis not present

## 2020-12-26 DIAGNOSIS — F419 Anxiety disorder, unspecified: Secondary | ICD-10-CM | POA: Diagnosis not present

## 2020-12-26 DIAGNOSIS — F32A Depression, unspecified: Secondary | ICD-10-CM | POA: Diagnosis not present

## 2020-12-26 DIAGNOSIS — K5900 Constipation, unspecified: Secondary | ICD-10-CM | POA: Diagnosis not present

## 2020-12-27 DIAGNOSIS — F331 Major depressive disorder, recurrent, moderate: Secondary | ICD-10-CM | POA: Diagnosis not present

## 2020-12-27 DIAGNOSIS — F22 Delusional disorders: Secondary | ICD-10-CM | POA: Diagnosis not present

## 2020-12-27 DIAGNOSIS — F411 Generalized anxiety disorder: Secondary | ICD-10-CM | POA: Diagnosis not present

## 2020-12-27 DIAGNOSIS — K5900 Constipation, unspecified: Secondary | ICD-10-CM | POA: Diagnosis not present

## 2020-12-28 DIAGNOSIS — F32A Depression, unspecified: Secondary | ICD-10-CM | POA: Diagnosis not present

## 2020-12-28 DIAGNOSIS — K5900 Constipation, unspecified: Secondary | ICD-10-CM | POA: Diagnosis not present

## 2020-12-28 DIAGNOSIS — F22 Delusional disorders: Secondary | ICD-10-CM | POA: Diagnosis not present

## 2020-12-28 DIAGNOSIS — F419 Anxiety disorder, unspecified: Secondary | ICD-10-CM | POA: Diagnosis not present

## 2020-12-29 DIAGNOSIS — F411 Generalized anxiety disorder: Secondary | ICD-10-CM | POA: Diagnosis not present

## 2020-12-29 DIAGNOSIS — G4700 Insomnia, unspecified: Secondary | ICD-10-CM | POA: Diagnosis not present

## 2020-12-29 DIAGNOSIS — R441 Visual hallucinations: Secondary | ICD-10-CM | POA: Diagnosis not present

## 2020-12-29 DIAGNOSIS — F331 Major depressive disorder, recurrent, moderate: Secondary | ICD-10-CM | POA: Diagnosis not present

## 2020-12-29 DIAGNOSIS — K5900 Constipation, unspecified: Secondary | ICD-10-CM | POA: Diagnosis not present

## 2020-12-30 DIAGNOSIS — G479 Sleep disorder, unspecified: Secondary | ICD-10-CM | POA: Diagnosis not present

## 2020-12-30 DIAGNOSIS — K5900 Constipation, unspecified: Secondary | ICD-10-CM | POA: Diagnosis not present

## 2020-12-30 DIAGNOSIS — F22 Delusional disorders: Secondary | ICD-10-CM | POA: Diagnosis not present

## 2020-12-30 DIAGNOSIS — F419 Anxiety disorder, unspecified: Secondary | ICD-10-CM | POA: Diagnosis not present

## 2020-12-31 ENCOUNTER — Ambulatory Visit (INDEPENDENT_AMBULATORY_CARE_PROVIDER_SITE_OTHER): Payer: Medicare HMO

## 2020-12-31 DIAGNOSIS — I639 Cerebral infarction, unspecified: Secondary | ICD-10-CM | POA: Diagnosis not present

## 2021-01-07 LAB — CUP PACEART REMOTE DEVICE CHECK
Date Time Interrogation Session: 20220813094649
Implantable Pulse Generator Implant Date: 20220126

## 2021-01-14 DIAGNOSIS — F0390 Unspecified dementia without behavioral disturbance: Secondary | ICD-10-CM | POA: Insufficient documentation

## 2021-01-16 DIAGNOSIS — I4891 Unspecified atrial fibrillation: Secondary | ICD-10-CM | POA: Diagnosis not present

## 2021-01-16 DIAGNOSIS — K219 Gastro-esophageal reflux disease without esophagitis: Secondary | ICD-10-CM | POA: Diagnosis not present

## 2021-01-16 DIAGNOSIS — G4733 Obstructive sleep apnea (adult) (pediatric): Secondary | ICD-10-CM | POA: Diagnosis not present

## 2021-01-16 DIAGNOSIS — F411 Generalized anxiety disorder: Secondary | ICD-10-CM | POA: Diagnosis not present

## 2021-01-16 DIAGNOSIS — I10 Essential (primary) hypertension: Secondary | ICD-10-CM | POA: Diagnosis not present

## 2021-01-16 DIAGNOSIS — K589 Irritable bowel syndrome without diarrhea: Secondary | ICD-10-CM | POA: Diagnosis not present

## 2021-01-16 DIAGNOSIS — F22 Delusional disorders: Secondary | ICD-10-CM | POA: Diagnosis not present

## 2021-01-16 DIAGNOSIS — F0390 Unspecified dementia without behavioral disturbance: Secondary | ICD-10-CM | POA: Diagnosis not present

## 2021-01-16 DIAGNOSIS — M1990 Unspecified osteoarthritis, unspecified site: Secondary | ICD-10-CM | POA: Diagnosis not present

## 2021-01-17 ENCOUNTER — Ambulatory Visit: Payer: Medicare HMO | Admitting: Behavioral Health

## 2021-01-17 ENCOUNTER — Other Ambulatory Visit: Payer: Self-pay

## 2021-01-17 ENCOUNTER — Encounter: Payer: Self-pay | Admitting: Behavioral Health

## 2021-01-17 DIAGNOSIS — F411 Generalized anxiety disorder: Secondary | ICD-10-CM | POA: Diagnosis not present

## 2021-01-17 DIAGNOSIS — R4189 Other symptoms and signs involving cognitive functions and awareness: Secondary | ICD-10-CM

## 2021-01-17 DIAGNOSIS — F5105 Insomnia due to other mental disorder: Secondary | ICD-10-CM

## 2021-01-17 DIAGNOSIS — R630 Anorexia: Secondary | ICD-10-CM

## 2021-01-17 DIAGNOSIS — F22 Delusional disorders: Secondary | ICD-10-CM

## 2021-01-17 DIAGNOSIS — F331 Major depressive disorder, recurrent, moderate: Secondary | ICD-10-CM | POA: Diagnosis not present

## 2021-01-17 DIAGNOSIS — F99 Mental disorder, not otherwise specified: Secondary | ICD-10-CM

## 2021-01-17 MED ORDER — OLANZAPINE 10 MG PO TABS: 10.0000 mg | ORAL_TABLET | Freq: Every day | ORAL | 2 refills | Status: DC

## 2021-01-17 MED ORDER — SERTRALINE HCL 100 MG PO TABS: 100.0000 mg | ORAL_TABLET | Freq: Every day | ORAL | 2 refills | Status: DC

## 2021-01-17 NOTE — Progress Notes (Addendum)
[facility]  Patient ID: [name] [name],  MRN: [id number]  PCP: Tonia Ghent, MD  Date of Evaluation: 01/17/2021 Time spent:30 minutes  Chief Complaint:  Chief Complaint   Depression; Anxiety; Insomnia; Cognitive Decline; Follow-up; Medication Refill; Medication Problem; Paranoid     HISTORY/CURRENT STATUS: HPI 80 year old female presents to this office for follow up and medication management. She is accompanied by her daughter with consent. She is using rollator to assist in ambulation. She is following up post hospitalization for 12/11/2020  for paranoia with psychosis. During hospitalization medications were changed or adjusted. She is acknowledging continued problems with paranoia but is more self-aware. She is happy to be home in her familiar environment. The daughter agrees that she is more stable now but she does have periods of paranoid delusions. They were unable to complete an MRI while in hospital due to patients refusal. Pt is returning here today for management and refills of medication. Pt and daughter agree that best plan is to continue on current medication regimen at this time. She endorses paranoia at times. No mania, no current auditory or visual hallucinations. Has passive SI with no plan. No HI.  Past failed psychiatric medications: Lexapro Zoloft Remeron Seroquel Hydroxyzine    Individual Medical History/ Review of Systems: Changes? :No   Allergies: Hydrocodone  Current Medications:  Current Outpatient Medications:    OLANZapine (ZYPREXA) 10 MG tablet, Take 1 tablet (10 mg total) by mouth at bedtime., Disp: 30 tablet, Rfl: 2   sertraline (ZOLOFT) 100 MG tablet, Take 1 tablet (100 mg total) by mouth daily., Disp: 30 tablet, Rfl: 2   acetaminophen (TYLENOL) 500 MG tablet, Take 500 mg by mouth every 6 (six) hours as needed for moderate pain. , Disp: , Rfl:    atenolol (TENORMIN) 25 MG tablet, Take 1 tablet (25 mg total) by mouth 2 (two) times  daily., Disp: , Rfl:    atorvastatin (LIPITOR) 80 MG tablet, TAKE ONE TABLET BY MOUTH EVERY DAY AT 6PM (Patient taking differently: Take 80 mg by mouth every evening.), Disp: 30 tablet, Rfl: 5   BRILINTA 90 MG TABS tablet, TAKE ONE TABLET TWICE DAILY (Patient taking differently: Take 90 mg by mouth 2 (two) times daily.), Disp: 60 tablet, Rfl: 2   cholecalciferol (VITAMIN D) 1000 UNITS tablet, Take 1,000 Units by mouth daily. , Disp: , Rfl:    hydrOXYzine (ATARAX/VISTARIL) 10 MG tablet, TAKE 1/2-1 TABLET BY MOUTH 3 TIMES DAILYAS NEEDED FOR ANXIETY (SEDATION CAUTION), Disp: 30 tablet, Rfl: 2   ipratropium (ATROVENT) 0.06 % nasal spray, one spray per nostril twice daily (up to three times daily) to help with runny nose (Patient taking differently: Place 1 spray into both nostrils 3 (three) times daily as needed for rhinitis.), Disp: 15 mL, Rfl: 5   loratadine (CLARITIN) 10 MG tablet, Take 10 mg by mouth daily as needed for allergies., Disp: , Rfl:    montelukast (SINGULAIR) 10 MG tablet, Stop as of 09/27/20, Disp: , Rfl:    polyethylene glycol powder (GLYCOLAX/MIRALAX) 17 GM/SCOOP powder, Take 17 g by mouth daily as needed for mild constipation., Disp: , Rfl:  Medication Side Effects: none  Family Medical/ Social History: Changes? No  MENTAL HEALTH EXAM:  There were no vitals taken for this visit.There is no height or weight on file to calculate BMI.  General Appearance: Casual, Neat, and Well Groomed  Eye Contact:  Good  Speech:  Pressured  Volume:  Normal  Mood:  Anxious  Affect:  Blunt, Depressed, Flat, Restricted, and Anxious  Thought Process:  Coherent  Orientation:  Full (Time, Place, and Person)  Thought Content: Paranoid Ideation   Suicidal Thoughts:  No  Homicidal Thoughts:  No  Memory:  WNL  Judgement:  Poor  Insight:  Fair  Psychomotor Activity:  Decreased  Concentration:  Concentration: Fair  Recall:  [place] of Knowledge: Fair  Language: Good  Assets:  Desire for  Improvement Physical Health Resilience Social Support  ADL's:  Impaired  Cognition: Impaired,  Mild  Prognosis:  Poor    DIAGNOSES:    ICD-10-CM   1. Generalized anxiety disorder  F41.1 sertraline (ZOLOFT) 100 MG tablet    OLANZapine (ZYPREXA) 10 MG tablet    2. Cognitive decline  R41.89 sertraline (ZOLOFT) 100 MG tablet    OLANZapine (ZYPREXA) 10 MG tablet    3. Major depressive disorder, recurrent episode, moderate (HCC)  F33.1 sertraline (ZOLOFT) 100 MG tablet    OLANZapine (ZYPREXA) 10 MG tablet    4. Lack of appetite  R63.0 sertraline (ZOLOFT) 100 MG tablet    OLANZapine (ZYPREXA) 10 MG tablet    5. Paranoia ([place])  F22     6. Insomnia due to other mental disorder  F51.05    F99       Receiving Psychotherapy: No    RECOMMENDATIONS:  Hospital stopped Seroquel Hospital stopped [facility] Stopped Hydroxyzine To continue hospital prescribed Zyprexa 10 mg daily To continue Zoloft  100 mg daily. Will report worsening symptoms, acute further decline in cognition or behaviors. Provided after hours emergency contact information Discussed potential metabolic side effects associated with atypical antipsychotics, as well as potential risk for movement side effects. Advised pt to contact office if movement side effects occur.  Greater than 50% of 30 min. face to face time with patient was spent on counseling and coordination of care. We discussed patients recent extended hospital stay for paranoid behaviors with psychosis. Discussed continued  follow ups with neurology. Reinforced to patient and daughter that psychotropic medication sometimes have limited response with vascular demential. According to recent imaging Pt is positive for chronic small vessel disease which lead to demential  in some patients or present with symptoms of cognitive decline. Pt was advised that therapy would include treating anxiety, depression, psychosis persisting of delusions and paranoid behaviors,   and sleep disorder as needed.  Reinforced with Patient and daughter  that due to age and risk factors we have to use caution when prescribing antipsychotic medication or fall risk medications. Explained that we have to be very cautious when increasing dosage of antipsychotic with advanced age.            Elwanda Brooklyn, NP

## 2021-01-18 DIAGNOSIS — I10 Essential (primary) hypertension: Secondary | ICD-10-CM | POA: Diagnosis not present

## 2021-01-18 DIAGNOSIS — K219 Gastro-esophageal reflux disease without esophagitis: Secondary | ICD-10-CM | POA: Diagnosis not present

## 2021-01-18 DIAGNOSIS — F22 Delusional disorders: Secondary | ICD-10-CM | POA: Diagnosis not present

## 2021-01-18 DIAGNOSIS — G4733 Obstructive sleep apnea (adult) (pediatric): Secondary | ICD-10-CM | POA: Diagnosis not present

## 2021-01-18 DIAGNOSIS — K589 Irritable bowel syndrome without diarrhea: Secondary | ICD-10-CM | POA: Diagnosis not present

## 2021-01-18 DIAGNOSIS — I4891 Unspecified atrial fibrillation: Secondary | ICD-10-CM | POA: Diagnosis not present

## 2021-01-18 DIAGNOSIS — F0390 Unspecified dementia without behavioral disturbance: Secondary | ICD-10-CM | POA: Diagnosis not present

## 2021-01-18 DIAGNOSIS — F411 Generalized anxiety disorder: Secondary | ICD-10-CM | POA: Diagnosis not present

## 2021-01-18 DIAGNOSIS — M1990 Unspecified osteoarthritis, unspecified site: Secondary | ICD-10-CM | POA: Diagnosis not present

## 2021-01-22 DIAGNOSIS — K219 Gastro-esophageal reflux disease without esophagitis: Secondary | ICD-10-CM | POA: Diagnosis not present

## 2021-01-22 DIAGNOSIS — M1990 Unspecified osteoarthritis, unspecified site: Secondary | ICD-10-CM | POA: Diagnosis not present

## 2021-01-22 DIAGNOSIS — G4733 Obstructive sleep apnea (adult) (pediatric): Secondary | ICD-10-CM | POA: Diagnosis not present

## 2021-01-22 DIAGNOSIS — I4891 Unspecified atrial fibrillation: Secondary | ICD-10-CM | POA: Diagnosis not present

## 2021-01-22 DIAGNOSIS — F411 Generalized anxiety disorder: Secondary | ICD-10-CM | POA: Diagnosis not present

## 2021-01-22 DIAGNOSIS — F22 Delusional disorders: Secondary | ICD-10-CM | POA: Diagnosis not present

## 2021-01-22 DIAGNOSIS — I10 Essential (primary) hypertension: Secondary | ICD-10-CM | POA: Diagnosis not present

## 2021-01-22 DIAGNOSIS — K589 Irritable bowel syndrome without diarrhea: Secondary | ICD-10-CM | POA: Diagnosis not present

## 2021-01-22 DIAGNOSIS — F0390 Unspecified dementia without behavioral disturbance: Secondary | ICD-10-CM | POA: Diagnosis not present

## 2021-01-23 ENCOUNTER — Other Ambulatory Visit: Payer: Self-pay

## 2021-01-23 NOTE — Progress Notes (Signed)
Carelink Summary Report / Loop Recorder 

## 2021-01-24 ENCOUNTER — Telehealth: Payer: Self-pay | Admitting: Family Medicine

## 2021-01-24 ENCOUNTER — Ambulatory Visit (INDEPENDENT_AMBULATORY_CARE_PROVIDER_SITE_OTHER): Payer: Medicare HMO | Admitting: Family Medicine

## 2021-01-24 ENCOUNTER — Encounter: Payer: Self-pay | Admitting: Family Medicine

## 2021-01-24 ENCOUNTER — Other Ambulatory Visit: Payer: Self-pay

## 2021-01-24 DIAGNOSIS — K219 Gastro-esophageal reflux disease without esophagitis: Secondary | ICD-10-CM | POA: Diagnosis not present

## 2021-01-24 DIAGNOSIS — F22 Delusional disorders: Secondary | ICD-10-CM | POA: Diagnosis not present

## 2021-01-24 DIAGNOSIS — F0390 Unspecified dementia without behavioral disturbance: Secondary | ICD-10-CM | POA: Diagnosis not present

## 2021-01-24 DIAGNOSIS — I10 Essential (primary) hypertension: Secondary | ICD-10-CM | POA: Diagnosis not present

## 2021-01-24 DIAGNOSIS — F32A Depression, unspecified: Secondary | ICD-10-CM | POA: Diagnosis not present

## 2021-01-24 DIAGNOSIS — M1990 Unspecified osteoarthritis, unspecified site: Secondary | ICD-10-CM | POA: Diagnosis not present

## 2021-01-24 DIAGNOSIS — G4733 Obstructive sleep apnea (adult) (pediatric): Secondary | ICD-10-CM | POA: Diagnosis not present

## 2021-01-24 DIAGNOSIS — K589 Irritable bowel syndrome without diarrhea: Secondary | ICD-10-CM | POA: Diagnosis not present

## 2021-01-24 DIAGNOSIS — F411 Generalized anxiety disorder: Secondary | ICD-10-CM | POA: Diagnosis not present

## 2021-01-24 DIAGNOSIS — I4891 Unspecified atrial fibrillation: Secondary | ICD-10-CM | POA: Diagnosis not present

## 2021-01-24 MED ORDER — ATORVASTATIN CALCIUM 20 MG PO TABS: 20.0000 mg | ORAL_TABLET | Freq: Every day | ORAL | 1 refills | Status: DC

## 2021-01-24 MED ORDER — DONEPEZIL HCL 5 MG PO TABS: 5.0000 mg | ORAL_TABLET | Freq: Every day | ORAL | 1 refills | Status: DC

## 2021-01-24 MED ORDER — THIAMINE HCL 100 MG PO TABS: 100.0000 mg | ORAL_TABLET | Freq: Every day | ORAL | 1 refills | Status: AC

## 2021-01-24 MED ORDER — ATENOLOL 25 MG PO TABS: 25.0000 mg | ORAL_TABLET | Freq: Two times a day (BID) | ORAL | 1 refills | Status: DC

## 2021-01-24 NOTE — Progress Notes (Signed)
This visit occurred during the SARS-CoV-2 public health emergency.  Safety protocols were in place, including screening questions prior to the visit, additional usage of staff PPE, and extensive cleaning of exam room while observing appropriate contact time as indicated for disinfecting solutions.  Inpatient follow-up.  Patient was admitted on 12/11/2020 and discharged on 01/15/2021 from [facility] inpatient facility.  Admitted with paranoia concerning for MDD with psychotic features versus major neurocognitive disorder.  Inpatient course discussed with patient.  Atorvastatin was decreased to 20 mg a day given her age and low LDL.  Patient reportedly refused MRI for clarification of possible vascular dementia during the hospitalization. She was admitted with low mood, social withdrawal, decreased appetite, paranoia and hallucinations.  It was felt that she had multifactorial features due to depression and a neurocognitive disorder.  Medications were adjusted to her current regimen.  She did have a fall resulting in a subarachnoid hemorrhage as an inpatient.  She was reportedly in the bathroom and fell and hit her head.  CT showed small subarachnoid hemorrhage.  Neurosurgery was consulted.  Her Brilinta was held for 2 weeks and then restarted thereafter.  She was also treated for possible UTI.  Here today with her son.  Patient's goal is to be at home safely and not have to have family looking after her.  She is noted some memory changes, as have family.  Patient has been getting extra help from other family members approximately 24/7 since coming home from the hospital.  She is not having consistent burning with urination but she has noted some urinary frequency.  She wears a pad at baseline.  She has a faint bilateral hand tremor.  We talked about getting follow-up labs and she wanted to defer that today.  She was worried that "somebody might find something wrong".  We talked about the rationale for her lower dose of  atorvastatin, that it was reasonable to be taking vitamin D, that was reasonable to use CPAP regularly, and that family is going to keep check over her weight over the next week and let me know if she has any more continued weight loss.  Meds, vitals, and allergies reviewed.   ROS: Per HPI unless specifically indicated in ROS section   Nad Ncat, she is pleasant in conversation but she appears slightly withdrawn compared to her previous baseline. Faint bilateral hand tremor noted. Neck supple, no LA Rrr Ctab Abd soft, not ttp Ext well perfused.  Speech is fluent.

## 2021-01-24 NOTE — Telephone Encounter (Signed)
Spoke with patient's son Louie Casa. He stated that his mom has been extremely paranoid since being in the hospital but even before then as well. She wasn't eating and wouldn't take her medications due to thinking she was going to be drugged or taken away somewhere. This has been going on since June. She thinks everyone is a Engineer, civil (consulting) including him. [facility] wanted to do a MRI but she wouldn't let them and stated even today wouldn't let our office due labs. Son stated that patient is scared to have anything done due to someone finding something bad and she have to go back to the hospital. Louie Casa stated that she is also having a lot of short term memory loss. 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 He is currently staying with her in her home and they are exploring options on what will be best for patient. They are planning on touring some places soon and hopefully get her input on this as well to help ease the transition. The social worker at [facility] had been helping him with all of this; so he has a long list of places to check into for assisted living vs someone to be at her home with her. Randys number is [phone] if you want to call him back to discuss all of this with him.

## 2021-01-24 NOTE — Telephone Encounter (Signed)
Mr. Permann called in and stated that Dr. Damita Dunnings wanted to talk to him about his mom.

## 2021-01-24 NOTE — Patient Instructions (Addendum)
Don't change your regular meds for now.   Update me about your weight in about 1 week.  Take care.  Glad to see you.

## 2021-01-25 NOTE — Telephone Encounter (Signed)
Late entry.  I called her son back after the office visit.  Discussed recent events.  They are caring for her at home.  We both thought it made sense to continue as is for now.  They are considering looking into placement options.  Social work at [employer] had previously been helping with this.  If he needs social work help from our clinic in the future he will let me know.  The plan remains for him to update me about her situation next week.  I thanked him for the call.

## 2021-01-28 MED ORDER — VITAMIN D3 25 MCG (1000 UT) PO CAPS: 1000.0000 [IU] | ORAL_CAPSULE | Freq: Every day | ORAL | Status: AC

## 2021-01-28 NOTE — Assessment & Plan Note (Signed)
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  Subarachnoid hemorrhage history noted.  In the meantime, her mood is better compared to when she was admitted to the hospital.  She is tolerating Lipitor and atenolol along with Brilinta.  She still on Aricept Zyprexa sertraline and thiamine.  Reasonable to continue her baseline medications, use MiraLAX as needed and add on vitamin D.  She has had some weight loss noted with previous metabolic work-up noted as inpatient.  She declined follow-up labs today.  I think it makes sense to continue with family supervision at home, update me about her weight next week, and we can go from there.  If she needs help with social work referral then patient/family can let me know and we can work on that.  We did not make any medication changes today.  It appears that the patient is appropriate and safe for outpatient follow-up with supervision from family.  30 minutes were devoted to patient care in this encounter (this includes time spent reviewing the patient's file/history, interviewing and examining the patient, counseling/reviewing plan with patient).

## 2021-01-29 DIAGNOSIS — F22 Delusional disorders: Secondary | ICD-10-CM | POA: Diagnosis not present

## 2021-01-29 DIAGNOSIS — F0390 Unspecified dementia without behavioral disturbance: Secondary | ICD-10-CM | POA: Diagnosis not present

## 2021-01-29 DIAGNOSIS — F411 Generalized anxiety disorder: Secondary | ICD-10-CM | POA: Diagnosis not present

## 2021-01-29 DIAGNOSIS — K219 Gastro-esophageal reflux disease without esophagitis: Secondary | ICD-10-CM | POA: Diagnosis not present

## 2021-01-29 DIAGNOSIS — K589 Irritable bowel syndrome without diarrhea: Secondary | ICD-10-CM | POA: Diagnosis not present

## 2021-01-29 DIAGNOSIS — I4891 Unspecified atrial fibrillation: Secondary | ICD-10-CM | POA: Diagnosis not present

## 2021-01-29 DIAGNOSIS — M1990 Unspecified osteoarthritis, unspecified site: Secondary | ICD-10-CM | POA: Diagnosis not present

## 2021-01-29 DIAGNOSIS — G4733 Obstructive sleep apnea (adult) (pediatric): Secondary | ICD-10-CM | POA: Diagnosis not present

## 2021-01-29 DIAGNOSIS — I10 Essential (primary) hypertension: Secondary | ICD-10-CM | POA: Diagnosis not present

## 2021-01-31 ENCOUNTER — Ambulatory Visit: Payer: Medicare HMO

## 2021-02-04 ENCOUNTER — Encounter: Payer: Self-pay | Admitting: Family Medicine

## 2021-02-04 ENCOUNTER — Encounter: Payer: Self-pay | Admitting: Adult Health

## 2021-02-04 ENCOUNTER — Ambulatory Visit: Payer: Medicare HMO | Admitting: Adult Health

## 2021-02-04 ENCOUNTER — Other Ambulatory Visit: Payer: Self-pay

## 2021-02-04 VITALS — BP 143/83 | HR 61 | Ht 59.0 in | Wt 124.0 lb

## 2021-02-04 DIAGNOSIS — F411 Generalized anxiety disorder: Secondary | ICD-10-CM | POA: Diagnosis not present

## 2021-02-04 DIAGNOSIS — G3184 Mild cognitive impairment, so stated: Secondary | ICD-10-CM | POA: Diagnosis not present

## 2021-02-04 DIAGNOSIS — E785 Hyperlipidemia, unspecified: Secondary | ICD-10-CM

## 2021-02-04 DIAGNOSIS — Z8673 Personal history of transient ischemic attack (TIA), and cerebral infarction without residual deficits: Secondary | ICD-10-CM

## 2021-02-04 NOTE — Progress Notes (Signed)
[facility] [address] [place]. [place] [address] [phone]       STROKE FOLLOW UP NOTE  Ms. KHRISTY ALLAIN Date of Birth:  1940-09-18 Medical Record Number:  [id number]   Reason for Referral: stroke follow up    SUBJECTIVE:   CHIEF COMPLAINT:  Chief Complaint  Patient presents with   Follow-up    Rm 7 with daughter suzanne  Pt is well, still has some R sided weakness but overall stable.  Memory has worsen.      HPI:   Today, 02/04/2021, Ms. 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  Started on Zyprexa and donepezil and titrated home dose Zoloft.  She refused MRI during admission for further clarification of possible vascular dementia.  She also [facility] per neurosurgery recommendations.  Also treated for UTI.  MSchoeneck8/15 23/30.  Recommended 24/7 care or placement in assisted living.  She was ultimately discharged home with family.  Since discharge, she has been doing well.  Daughter reports improvement of paranoia and psychosis on current regimen.  Family was initially providing 24/7 supervision but as she was doing well continue ADLs majority of IADLs independently, family started to leave her by herself a couples hrs a day since last week. She did have a fall yesterday with right leg giving out but thankfully without injury.  [facility] and gait as well as lower back exercises and use of Rollator walker at all times.  Daughter still assists with medication and bill paying.  Cognition overall stable.  MMSE today 25/30.  Remains on Aricept.  Also remains on Brilinta and atorvastatin.  Blood pressure today 143/83.  No  further concerns at this time     History provided for reference purposes only Update 11/05/2020 Dr. SLeonie Man She returns for follow-up after last visit with JMeadpractitioner 6 weeks ago.  She is accompanied by her daughter.  Their main complaint today is her memory loss which she has had since stroke.  Her short-term memory is poor.  She is also continues to have problems with anxiety and paranoia.  [facility]  She initially tried Lexapro and Zoloft without benefit.  She is currently taking Seroquel at night but mostly to help her sleep.  She still having some mild anxiety and paranoid ideation.  She continues to have mild weakness in the right leg as well as left hand digits getting better.  She is tolerating Brilinta well without any bruising.  Blood pressures well controlled today it is 131/86.  She is tolerating Lipitor well without muscle aches and pains.  Patient is having short-term memory difficulties ever since her stroke which is not getting better.  Her daughter started helping her do her bills and finances.  She still lives alone and is independent.  She has not had any work-up for reversible causes of memory loss.  Update 09/12/2020, (Frann Rider NP ) Ms. FOhnesorgereturns for acute visit regarding new onset symptoms accompanied by her daughter  Approx 1 month ago, she suffered a fall after her right leg gave out.  She was fully evaluated in ED who felt likely due to chronic knee pain  but per family request, CT head and MRI brain completed which was unremarkable for acute stroke.  Since that time, daughter has noticed worsening memory with increased times of confusion, forgetfulness and unsteadiness. Per daughter, symptoms onset around the time aspirin was discontinued.  Patient has chronic history of low back pain previously seen by [name] in 10/2019 but no recent follow-up as this was relatively stable but over the past month, pain has  been more consistent and more frequent episodes of bilateral hip weakness sensation and right leg weakness typically occurring after prolonged ambulation.  She does have scheduled visit with [name] on 4/28 to undergo nerve block injections.   She does continue to live independently but her daughter checks on her routinely and assists with IADLs.  She has been greatly struggling with anxiety over the past month.  She does have history of anxiety but recently greatly worsened.  PCP recently started Lexapro and recommended increasing to 15 mg daily but she is very hesitant to increase as she read online Lexapro can worsen anxiety.  She is also been struggling with insomnia as her mind will constantly wander with hydroxyzine prescribed by PCP but she denies much benefit.  She speaks of difficulty with her relationships as she has been limiting social interactions due to her low back pain and fear of her right leg giving out - she also feels like she is constantly being judged by her friends or worried about what they are thinking of her.  She is concerned if she misses a social event due to her low back pain and what sounds like social anxiety.  She is also fearful of undergoing nerve block injections as she is on Brilinta and feels like this should be discontinued prior to injection but was told by [name] nurse assistant that that was not necessary.  Of note, seen by PCP Dr. Damita Dunnings 4/5 regarding these concerns who felt multifactorial and likely not new acute strokes.  She was also treated for shingles with Valtrex.  She has remained on Brilinta and atorvastatin without associated side effects Blood pressure 142/86 -occasionally monitored at home and typically stable She does endorse nightly compliance with CPAP for OSA management Loop recorder has not shown atrial fibrillation thus far  No further concerns at this time   Update 07/04/2020 JM: Ms. Tukes returns for scheduled follow-up regarding prior  strokes accompanied by her daughter. She has been stable since prior visit without any additional stroke/TIA symptoms. Reports residual left hand weakness and gait impairment. She will use RW at times especially upon awakening but otherwise doesn't need AD. She denies any recent falls.  She has remained on Brilinta, aspirin and atorvastatin 80 mg daily without side effects.  24-monthaspirin and Brilinta duration will be completed at the end of this month.  Blood pressure today 121/74.  Monitors at home and typically stable.  Reports ongoing compliance with CPAP followed by Dr. ARexene Alberts Due to concern of multiple strokes etiology cryptogenic, loop recorder placed by Dr. ARayann Hemanon 06/20/2020 for further long-term monitoring of atrial fibrillation.  No further concerns at this time.  Update 05/14/2020 JM: Ms. FBazareturns per request to follow-up on recent hospitalizations.  She is accompanied by her daughter.  Personally reviewed recent hospitalizations with summary provided below.  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  P2 Y 12 4  showing inadequate Plavix function therefore switched to aspirin 81 mg daily and Brilinta 90 mg twice daily for 3 months and then continue on Brilinta alone.  Initially recommended discharge to CIR but due to functional improvement she was discharged home on 04/23/2020 with home health therapies.  She then presented to [facility] ED on 05/08/2020 for 1 wk hx of intermittent LUE and LLE weakness, numbness/tingling and right-sided headache but due to wait time she left prior to full evaluation and went to [facility] ED with MRI reporting acute to early subacute infarct in the left centrum semiovale which was not consistent with presenting symptoms.  Per further review with neurologist, it was felt as though reported new infarct seen on  prior MRIs therefore no evidence of new stroke.  Evaluated by neurology with note personally reviewed and felt possible cause of symptoms in setting of hypoperfusion with known intracranial arthrosclerosis and atenolol dosage decreased.  She then returned to [facility] ED yesterday, 05/13/2020, for left lower extremity heaviness, palpitations and headache.  Found to be hypertensive with BP 196/105.  No neurological deficits noted.  Symptoms resolved without intervention.  Mention of increased anxiety possibly contributing to symptoms.  CT head negative.  She was discharged back home in stable condition.  She reports continued intermittent b/l hand weakness where she will occasionally drop items and continued gait impairment currently working with [facility] PT. currently using rolling walker for ambulation.  Denies new or worsening stroke/TIA symptoms.  Remains on aspirin 81 mg daily and Brilinta without bleeding or bruising.  Remains on atorvastatin 80 mg daily without myalgias.  Blood pressure today 128/75.  She does routinely monitor at home but she questions accuracy as her results are consistently 150s.  She does report nightly use of CPAP for OSA management.  Completed approximately 14 days of cardiac monitor which was negative for atrial fibrillation.  She questions need of ILR.  She does endorse increased anxiety regarding multiple strokes and WHEN (not if) another one will occur.  No further concerns at this time.  Update 03/01/2020 JM: Ms. Cort returns for sooner scheduled visit per PCP request due to recent stroke accompanied by her daughter.  She presented to [facility] regional emergency room on 02/16/2020 with right-sided weakness contributing to fall.  MRI showed left frontal lobe infarcts likely from stenosis and left M2 all within the same vascular territory and was not felt to be embolic.  MRA head showed progression of stenosis within the distal A2/proximal artery which is now severe with additional  intracranial atherosclerotic stenosis without interval change including severe focal stenosis within the cavernous left ICA, moderate focal stenosis within the cavernous right ICA, moderate stenosis with A2 right ACA and moderate stenosis within the proximal P2 left PCA.  2D echo showed EF of 60 to 65%.  On aspirin PTA and recommended DAPT " but long-term she may need Effient instead of Plavix or Brilinta".  Recommended to check Plavix assays outpatient to ensure adequate response.  HTN stable.  LDL 54 recommended continuation of atorvastatin 80 mg daily.  She did have right knee pain secondary to fall and recommended follow-up with orthopedics outpatient.  Evaluated by therapy initially recommended [facility] PT/OT but improved prior to discharge with [facility] therapy no longer indicated.  She was discharged home in stable condition and advised to follow-up with PCP, neurology and orthopedics.    She has been stable since returning home without new or reoccurring stroke/TIA symptoms.  Denies residual right-sided symptoms but does continue to have  chronic right knee pain and ankle pain post fall which has been limiting her ambulation.  She is currently working with [facility] PT and ambulating with rolling walker due to right leg pain.  Continues to have mild dysarthria and decreased LUE dexterity which has been stable after recent stroke and improvement since prior visit.  She has continued on aspirin and Plavix without bleeding or bruising.  Remains on atorvastatin 80 mg daily without myalgias.  Blood pressure today satisfactory at 112/69.  She reports intermittent use of CPAP for sleep apnea management with difficulty tolerating due to increased pressure.  Reports undergoing sleep study over 10 years ago and has not had any repeat study or follow-up to ensure adequate management of apnea.  Multiple questions regarding recent stroke and etiology as well as recommended Plavix lab work Nurse, children's) as recommended by [employer]  neurologist.  Further reviewed recent admission and imaging with Dr. Leonie Man due to concern of stroke locations and possible embolic etiology. Per Dr. Leonie Man, he does not believe recent stroke was due to large vessel disease but more so embolic secondary to unknown source as her recent imaging showed infarcts within different vascular locations including ACA and MCA.  Also reviewed imaging from prior stroke in 06/2019 and due to size, potentially embolic. He recommends further cardiac evaluation with placement of loop recorder to assess for possible atrial fibrillation as stroke etiology.  No indication for TEE. He recommends aspirin and Plavix for only 3-week duration and then Plavix alone.  No indication for P2 Y 12 testing as she has no history or evidence of Plavix failure or concern.  Update 01/02/2020 JM: Ms. Kutney is being seen for stroke follow-up previously followed through [facility] research participating in [place] stroke trial. Evaluated during stroke trial on 10/10/2019 recently completing [place] therapies with residual deficits of mild decrease left hand dexterity, imbalance and dysarthria therefore additional orders placed for outpatient therapies.  She does report ongoing improvement and continues to work with neuro rehab PT/OT/ST for residual dysarthria and dysphonia, gait impairment/imbalance and LUE incoordination. Evaluated in [place] by Dr. 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  Reports continued participation SLP with [facility] Reports imbalance greatest difficulty still with improvement but also underlying chronic lower back, bilateral hip and knee pain.  She wishes to proceed with additional injections and nerve blocks therefore withdrew from research study approximately 1 month ago.  Continues to follow with [facility] She remains on aspirin '81mg'$  daily without bleeding or bruising. Continues on atorvastatin 80 mg daily without myalgias.  Recent  lipid panel 10/13/2019 showed LDL 64.  Blood pressure today 116/70.  No further concerns.  Stroke admission 07/11/2019 Personally reviewed recent hospitalization pertinent notes, labs and imaging Ms. CHERLENE RISSER is a 80 y.o. female with history of HTN, HLD, obesity who presented on 07/11/2019 with dysarthria, L facial droop, mild L hemiparesis as well as hypertensive urgency.  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  Recommended DAPT for 3 weeks and aspirin alone.  BP upon arrival 202/110 stabilized during admission and recommended long-term BP goal normotensive range.  LDL 130 and initiate atorvastatin 80 mg daily. Other stroke risk factors include advanced age, EtOH use, OSA on CPAP and family reported hx imaging with evidence of small AAA.  Evaluated by therapy and recommend discharge to CIR for ongoing therapy needs but unfortunately did not by insurance therefore discharged home with home health therapy.  Also  advised to follow-up with [facility] research is interested in [employer] stroke trial.   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  MRI  R corona radiata and caudate head small vessel disease. infarcts CTA head & neck negative LVO, negative arthrosclerosis or stenosis in neck, positive for intracranial arthrosclerosis with significant stenosis within moderate to severe left ICA anterior genu, moderate bilateral ICA distally to and moderate left PCA P1 2D Echo  EF 50 to 55% LDL 130 HgbA1c 5.5 Lovenox 40 mg sq daily for VTE prophylaxis No antithrombotic prior to admission, now on clopidogrel 75 mg daily and aspirin 324 mg. Change aspirin to 81. Continue DAPT x 3 weeks then aspirin alone.  Therapy recommendations: CLR -insurance denied therefore recommended HH therapy Disposition: Home Recommend aspirin and Plavix for 3 weeks followed by aspirin alone. She is  participating in the  [employer] stroke prevention trial( standard of care antiplatelets and Factor xi inhibitor versus standard of care antiplates and placebo)         ROS:   14 system review of systems performed and negative with exception of see HPI   PMH:  Past Medical History:  Diagnosis Date   Arthritis    knees   Depression    Fatty liver    GERD (gastroesophageal reflux disease)    Hyperlipidemia    Hypertension    IBS (irritable bowel syndrome)    Obesity    Pneumonia    Sleep apnea    uses CPAP   Stroke (cerebrum) (HCC)     PSH:  Past Surgical History:  Procedure Laterality Date   BLADDER SURGERY     BREAST SURGERY     breast biopsy   BROW LIFT Bilateral 04/14/2017   Procedure: BLEPHAROPLASTY UPPER EYELID WITH EXCESS SKIN;  Surgeon: Karle Starch, MD;  Location: [place];  Service: Ophthalmology;  Laterality: Bilateral;   CATARACT EXTRACTION W/PHACO Left 02/05/2016   Procedure: CATARACT EXTRACTION PHACO AND INTRAOCULAR LENS PLACEMENT ([place]);  Surgeon: Birder Robson, MD;  Location: [facility];  Service: Ophthalmology;  Laterality: Left;  [place] 01:10 AP% 22.3 CDE 15.67 Fluid pack lot # [id number]   CATARACT EXTRACTION W/PHACO Right 02/26/2016   Procedure: CATARACT EXTRACTION PHACO AND INTRAOCULAR LENS PLACEMENT (IOC);  Surgeon: Birder Robson, MD;  Location: [facility];  Service: Ophthalmology;  Laterality: Right;  [place] 57.4 AP% 24.0 CDE 13.75 Fluid Pack lot # [id number]   CHOLECYSTECTOMY     COLONOSCOPY WITH PROPOFOL N/A 12/18/2014   Procedure: COLONOSCOPY WITH PROPOFOL;  Surgeon: Manya Silvas, MD;  Location: [facility];  Service: Endoscopy;  Laterality: N/A;   DEEP NECK LYMPH NODE BIOPSY / EXCISION     implantable loop recorder implantation  06/20/2020   Medtronic Reveal Linq model LNQ 22 ([id number] [id number]) implantable loop recorder for cryptogenic stroke   JOINT REPLACEMENT     KNEE ARTHROPLASTY Left 06/20/2015   Procedure: COMPUTER ASSISTED TOTAL KNEE ARTHROPLASTY;   Surgeon: Dereck Leep, MD;  Location: [facility];  Service: Orthopedics;  Laterality: Left;   PTOSIS REPAIR Bilateral 04/14/2017   Procedure: PTOSIS REPAIR RESECT EX;  Surgeon: Karle Starch, MD;  Location: [place];  Service: Ophthalmology;  Laterality: Bilateral;  sleep apnea   TONSILLECTOMY     TOTAL HIP ARTHROPLASTY Right 07/21/2018   Procedure: TOTAL HIP ARTHROPLASTY ANTERIOR APPROACH;  Surgeon: Gaynelle Arabian, MD;  Location: [facility];  Service: Orthopedics;  Laterality: Right;  Social History:  Social History   Socioeconomic History   Marital status: Widowed    Spouse name: Not on file   Number of children: Not on file   Years of education: Not on file   Highest education level: Not on file  Occupational History   Occupation: retired  Tobacco Use   Smoking status: Never   Smokeless tobacco: Never  Vaping Use   Vaping Use: Never used  Substance and Sexual Activity   Alcohol use: Yes    Comment: ocassional glass of wine 2-3 times a year   Drug use: No   Sexual activity: Never  Other Topics Concern   Not on file  Social History Narrative   Lives alone in home   Right Handed   Drinks 1-2 cups caffeine daily   Social Determinants of Health   Financial Resource Strain: Not on file  Food Insecurity: Not on file  Transportation Needs: Not on file  Physical Activity: Not on file  Stress: Not on file  Social Connections: Not on file  Intimate Partner Violence: Not on file    Family History:  Family History  Problem Relation Age of Onset   Heart attack Mother    Dementia Father    Heart attack Father 7   Aortic aneurysm Brother    Stroke Paternal Grandfather    Colon cancer Neg Hx    Breast cancer Neg Hx     Medications:   Current Outpatient Medications on File Prior to Visit  Medication Sig Dispense Refill   atenolol (TENORMIN) 25 MG tablet Take 1 tablet (25 mg total) by mouth 2 (two) times daily. 180 tablet 1   atorvastatin (LIPITOR) 20 MG tablet  Take 1 tablet (20 mg total) by mouth daily. 90 tablet 1   BRILINTA 90 MG TABS tablet TAKE ONE TABLET TWICE DAILY (Patient taking differently: Take 90 mg by mouth 2 (two) times daily.) 60 tablet 2   Cholecalciferol (VITAMIN D3) 25 MCG (1000 UT) CAPS Take 1 capsule (1,000 Units total) by mouth daily.     donepezil (ARICEPT) 5 MG tablet Take 1 tablet (5 mg total) by mouth daily. 90 tablet 1   melatonin 3 MG TABS tablet Take by mouth.     OLANZapine (ZYPREXA) 10 MG tablet Take 1 tablet (10 mg total) by mouth at bedtime. 30 tablet 2   polyethylene glycol powder (GLYCOLAX/MIRALAX) 17 GM/SCOOP powder Take 17 g by mouth daily as needed for mild constipation.     sertraline (ZOLOFT) 100 MG tablet Take 1 tablet (100 mg total) by mouth daily. 30 tablet 2   thiamine 100 MG tablet Take 1 tablet (100 mg total) by mouth daily. 90 tablet 1   No current facility-administered medications on file prior to visit.    Allergies:   Allergies  Allergen Reactions   Hydrocodone Nausea Only    Noted after surgery, may be able to tolerate with food      OBJECTIVE:  Physical Exam  Vitals:   02/04/21 1034  BP: (!) 143/83  Pulse: 61  Weight: 124 lb (56.2 kg)  Height: '4\' 11"'$  (1.499 m)    Body mass index is 25.04 kg/m. No results found.  General: well developed, well nourished, pleasant elderly Caucasian female, seated, in no evident distress Neck: supple with no carotid or supraclavicular bruits Cardiovascular: regular rate and rhythm, no murmurs Vascular:  Normal pulses all extremities  Neurologic Exam Mental Status: Awake and fully alert.  Fluent speech and language.  Oriented  to place and time. Recent and remote memory impaired. Attention span, concentration and fund of knowledge mostly appropriate by daughter with supplement history. Mood and affect appropriate.   MMSE - Mini Mental State Exam 02/04/2021 08/17/2017 08/14/2016  Orientation to time '5 5 5  '$ Orientation to Place '5 5 5  '$ Registration '3 3  3  '$ Attention/ Calculation 2 0 0  Recall '1 3 3  '$ Language- name 2 objects 2 0 0  Language- repeat '1 1 1  '$ Language- follow 3 step command '3 3 3  '$ Language- read & follow direction 1 0 0  Write a sentence 1 0 0  Copy design 1 0 0  Total score '25 20 20   '$ Cranial Nerves: Pupils equal, briskly reactive to light. Extraocular movements full without nystagmus. Visual fields full to confrontation. Hearing intact. Facial sensation intact.  Tongue and palate moves normally and symmetrically.  Mild left lower facial weakness Motor: Normal bulk and tone. Normal strength in all tested extremity muscles except mild weakness in left hand and diminished finger tapping on left Sensory.: intact to touch , pinprick , position and vibratory sensation.  Coordination: Rapid alternating movements normal in all extremities. Finger-to-nose and heel-to-shin performed accurately bilaterally.  Gait and Station: Stands from seated position without difficulty.  Stance is normal.  Gait demonstrates relatively normal stride length with slight unsteadiness with use of Rollator walker Reflexes: 1+ and symmetric. Toes downgoing.       ASSESSMENT/PLAN: BRENTLEY BALLANTINE is a 80 y.o. year old female with history of R MCA stroke felt secondary to small vessel disease on 07/11/2019, left frontal lobe infarct 02/16/2020 evaluated at [facility] regional felt secondary to large vessel disease vs embolic source, left ACA stroke 03/2020 likely secondary to progressive intracranial arthrosclerosis.  P2Y12 4 on Plavix.  2 additional ED evaluations 05/09/2020 and 05/13/2020 for worsening left-sided weakness and numbness without evidence of acute infarct contributing to symptoms and felt likely in setting of hypoperfusion with intracranial arthrosclerosis. Vascular risk factors include HTN, HLD, intracranial arthrosclerosis with stenosis, OSA on CPAP, EtOH use and obesity.     1.  Mild cognitive impairment -Overall stable with MMSE  25/30 -Continue Aricept 5 mg daily -Dementia panel 10/2020 unremarkable -EEG 10/2020 normal  2.  History of prior strokes -Continue Brilinta and atorvastatin for secondary stroke prevention measures -Loop recorder has not shown atrial fibrillation thus far -Maintain strict control of hypertension with blood pressure goal below 130/90, and lipids with LDL cholesterol goal below 70 mg percent  -Obtain lipid panel  3. MDD -Associated with paranoia and psychosis with recent prolonged hospitalization at [facility] from 7/19-8/23 -Currently stable on current regimen routinely monitored by psychiatry -May have some contribution to fluctuating cognitive impairment   Follow-up as scheduled in January for CPAP compliance visit   I spent 37 minutes of face-to-face and non-face-to-face time with patient and daughter.  This included previsit chart review, lab review, study review, order entry, electronic health record documentation, patient and daughter discussion/education regarding cognitive concerns with history of prior strokes, completion and review of MMSE, importance of managing stroke risk factors and secondary stroke prevention measures and answered all other questions to patient and daughters satisfaction    CC:  [facility] provider: Dr. Oliver Hum, Elveria Rising, MD     Frann Rider, AGNP-BC  [facility] [address] [place], [place] [address]  Phone [phone] Fax [phone] Note: This document was prepared with digital dictation and possible smart phrase technology. Any transcriptional errors  that result from this process are unintentional.

## 2021-02-04 NOTE — Patient Instructions (Signed)
Continue Brilinta (ticagrelor) 90 mg bid  and atorvastatin '20mg'$  daily  for secondary stroke prevention  We will check cholesterol levels today  Continue to follow up with PCP regarding cholesterol  and blood pressure management  Maintain strict control of hypertension with blood pressure goal below 130/90 and cholesterol with LDL cholesterol (bad cholesterol) goal below 70 mg/dL.       Followup in the future with me in 6 months or call earlier if needed       Thank you for coming to see [place] at [facility]. I hope we have been able to provide you high quality care today.  You may receive a patient satisfaction survey over the next few weeks. We would appreciate your feedback and comments so that we may continue to improve ourselves and the health of our patients.

## 2021-02-05 ENCOUNTER — Other Ambulatory Visit: Payer: Self-pay | Admitting: Adult Health

## 2021-02-05 LAB — LIPID PANEL
Chol/HDL Ratio: 3.3 ratio (ref 0.0–4.4)
Cholesterol, Total: 168 mg/dL (ref 100–199)
HDL: 51 mg/dL (ref >39)
LDL Chol Calc (NIH): 88 mg/dL (ref 0–99)
Triglycerides: 170 mg/dL — ABNORMAL HIGH (ref 0–149)
VLDL Cholesterol Cal: 29 mg/dL (ref 5–40)

## 2021-02-05 MED ORDER — ATORVASTATIN CALCIUM 40 MG PO TABS: 40.0000 mg | ORAL_TABLET | Freq: Every day | ORAL | 3 refills | Status: DC

## 2021-02-06 ENCOUNTER — Ambulatory Visit: Payer: Medicare HMO | Admitting: Adult Health

## 2021-02-06 ENCOUNTER — Telehealth: Payer: Self-pay | Admitting: *Deleted

## 2021-02-06 DIAGNOSIS — F411 Generalized anxiety disorder: Secondary | ICD-10-CM | POA: Diagnosis not present

## 2021-02-06 DIAGNOSIS — F22 Delusional disorders: Secondary | ICD-10-CM | POA: Diagnosis not present

## 2021-02-06 DIAGNOSIS — I4891 Unspecified atrial fibrillation: Secondary | ICD-10-CM | POA: Diagnosis not present

## 2021-02-06 DIAGNOSIS — I10 Essential (primary) hypertension: Secondary | ICD-10-CM | POA: Diagnosis not present

## 2021-02-06 DIAGNOSIS — G4733 Obstructive sleep apnea (adult) (pediatric): Secondary | ICD-10-CM | POA: Diagnosis not present

## 2021-02-06 DIAGNOSIS — K219 Gastro-esophageal reflux disease without esophagitis: Secondary | ICD-10-CM | POA: Diagnosis not present

## 2021-02-06 DIAGNOSIS — F0390 Unspecified dementia without behavioral disturbance: Secondary | ICD-10-CM | POA: Diagnosis not present

## 2021-02-06 DIAGNOSIS — K589 Irritable bowel syndrome without diarrhea: Secondary | ICD-10-CM | POA: Diagnosis not present

## 2021-02-06 DIAGNOSIS — M1990 Unspecified osteoarthritis, unspecified site: Secondary | ICD-10-CM | POA: Diagnosis not present

## 2021-02-06 NOTE — Chronic Care Management (AMB) (Signed)
  Chronic Care Management   Note  02/06/2021 Name: [name] [name] MRN: [id number] DOB: 1940/11/25  [name] [name] is a 80 y.o. year old female who is a primary care patient of Tonia Ghent, MD. I reached out to [name] [name] by phone today in response to a referral sent by Ms. Tonia Ghent Spare's PCP Tonia Ghent, MD     Ms. Foglesong was given information about Chronic Care Management services today including:  CCM service includes personalized support from designated clinical staff supervised by her physician, including individualized plan of care and coordination with other care providers 24/7 contact phone numbers for assistance for urgent and routine care needs. Service will only be billed when office clinical staff spend 20 minutes or more in a month to coordinate care. Only one practitioner may furnish and bill the service in a calendar month. The patient may stop CCM services at any time (effective at the end of the month) by phone call to the office staff. The patient will be responsible for cost sharing (co-pay) of up to 20% of the service fee (after annual deductible is met).  Patient agreed to services and verbal consent obtained.   Follow up plan: Telephone appointment with care management team member scheduled for: 02/25/2021  Julian Hy, [facility]  Direct Dial: [phone]

## 2021-02-07 LAB — CUP PACEART REMOTE DEVICE CHECK
Date Time Interrogation Session: 20220915094754
Implantable Pulse Generator Implant Date: 20220126

## 2021-02-08 DIAGNOSIS — I10 Essential (primary) hypertension: Secondary | ICD-10-CM | POA: Diagnosis not present

## 2021-02-08 DIAGNOSIS — F22 Delusional disorders: Secondary | ICD-10-CM | POA: Diagnosis not present

## 2021-02-08 DIAGNOSIS — K589 Irritable bowel syndrome without diarrhea: Secondary | ICD-10-CM | POA: Diagnosis not present

## 2021-02-08 DIAGNOSIS — F0390 Unspecified dementia without behavioral disturbance: Secondary | ICD-10-CM | POA: Diagnosis not present

## 2021-02-08 DIAGNOSIS — M1990 Unspecified osteoarthritis, unspecified site: Secondary | ICD-10-CM | POA: Diagnosis not present

## 2021-02-08 DIAGNOSIS — I4891 Unspecified atrial fibrillation: Secondary | ICD-10-CM | POA: Diagnosis not present

## 2021-02-08 DIAGNOSIS — F411 Generalized anxiety disorder: Secondary | ICD-10-CM | POA: Diagnosis not present

## 2021-02-08 DIAGNOSIS — K219 Gastro-esophageal reflux disease without esophagitis: Secondary | ICD-10-CM | POA: Diagnosis not present

## 2021-02-08 DIAGNOSIS — G4733 Obstructive sleep apnea (adult) (pediatric): Secondary | ICD-10-CM | POA: Diagnosis not present

## 2021-02-12 DIAGNOSIS — K219 Gastro-esophageal reflux disease without esophagitis: Secondary | ICD-10-CM | POA: Diagnosis not present

## 2021-02-12 DIAGNOSIS — I10 Essential (primary) hypertension: Secondary | ICD-10-CM | POA: Diagnosis not present

## 2021-02-12 DIAGNOSIS — K589 Irritable bowel syndrome without diarrhea: Secondary | ICD-10-CM | POA: Diagnosis not present

## 2021-02-12 DIAGNOSIS — F411 Generalized anxiety disorder: Secondary | ICD-10-CM | POA: Diagnosis not present

## 2021-02-12 DIAGNOSIS — M1990 Unspecified osteoarthritis, unspecified site: Secondary | ICD-10-CM | POA: Diagnosis not present

## 2021-02-12 DIAGNOSIS — G4733 Obstructive sleep apnea (adult) (pediatric): Secondary | ICD-10-CM | POA: Diagnosis not present

## 2021-02-12 DIAGNOSIS — I4891 Unspecified atrial fibrillation: Secondary | ICD-10-CM | POA: Diagnosis not present

## 2021-02-12 DIAGNOSIS — F0390 Unspecified dementia without behavioral disturbance: Secondary | ICD-10-CM | POA: Diagnosis not present

## 2021-02-12 DIAGNOSIS — F22 Delusional disorders: Secondary | ICD-10-CM | POA: Diagnosis not present

## 2021-02-21 ENCOUNTER — Telehealth: Payer: Self-pay | Admitting: Family Medicine

## 2021-02-21 NOTE — Telephone Encounter (Signed)
Princess of [facility] called asking for verbal order to change pt appt to Monday 02/25/21.

## 2021-02-21 NOTE — Telephone Encounter (Signed)
Called and left message for Princess that it is okay to change patients appt to 02/25/21. Advised to call back if theres any concerns.

## 2021-02-21 NOTE — Telephone Encounter (Signed)
Please give the order.  Thanks.   

## 2021-02-25 ENCOUNTER — Ambulatory Visit (INDEPENDENT_AMBULATORY_CARE_PROVIDER_SITE_OTHER): Payer: Medicare HMO | Admitting: *Deleted

## 2021-02-25 ENCOUNTER — Other Ambulatory Visit: Payer: Self-pay | Admitting: *Deleted

## 2021-02-25 DIAGNOSIS — I4891 Unspecified atrial fibrillation: Secondary | ICD-10-CM | POA: Diagnosis not present

## 2021-02-25 DIAGNOSIS — F32A Depression, unspecified: Secondary | ICD-10-CM | POA: Diagnosis not present

## 2021-02-25 DIAGNOSIS — K219 Gastro-esophageal reflux disease without esophagitis: Secondary | ICD-10-CM | POA: Diagnosis not present

## 2021-02-25 DIAGNOSIS — I639 Cerebral infarction, unspecified: Secondary | ICD-10-CM | POA: Diagnosis not present

## 2021-02-25 DIAGNOSIS — I10 Essential (primary) hypertension: Secondary | ICD-10-CM | POA: Diagnosis not present

## 2021-02-25 DIAGNOSIS — K589 Irritable bowel syndrome without diarrhea: Secondary | ICD-10-CM | POA: Diagnosis not present

## 2021-02-25 DIAGNOSIS — Z8673 Personal history of transient ischemic attack (TIA), and cerebral infarction without residual deficits: Secondary | ICD-10-CM

## 2021-02-25 DIAGNOSIS — M1990 Unspecified osteoarthritis, unspecified site: Secondary | ICD-10-CM | POA: Diagnosis not present

## 2021-02-25 DIAGNOSIS — F0390 Unspecified dementia without behavioral disturbance: Secondary | ICD-10-CM | POA: Diagnosis not present

## 2021-02-25 DIAGNOSIS — F22 Delusional disorders: Secondary | ICD-10-CM | POA: Diagnosis not present

## 2021-02-25 DIAGNOSIS — F411 Generalized anxiety disorder: Secondary | ICD-10-CM

## 2021-02-25 DIAGNOSIS — G4733 Obstructive sleep apnea (adult) (pediatric): Secondary | ICD-10-CM | POA: Diagnosis not present

## 2021-02-25 NOTE — Chronic Care Management (AMB) (Signed)
Chronic Care Management    Clinical Social Work Note  02/25/2021 Name: [name] [name] MRN: [id number] DOB: 11/12/1940  [name] [name] is a 80 y.o. year old female who is a primary care patient of Tonia Ghent, MD. The CCM team was consulted to assist the patient with chronic disease management and/or care coordination needs related to: [employer]  and Level of Care Concerns.   Engaged with patient by telephone for initial visit in response to provider referral for social work chronic care management and care coordination services.   Consent to Services:  The patient was given information about Chronic Care Management services, agreed to services, and gave verbal consent prior to initiation of services.  Please see initial visit note for detailed documentation.   Patient agreed to services and consent obtained.   Assessment: Review of patient past medical history, allergies, medications, and health status, including review of relevant consultants reports was performed today as part of a comprehensive evaluation and provision of chronic care management and care coordination services.     SDOH (Social Determinants of Health) assessments and interventions performed:  SDOH Interventions    Flowsheet Row Most Recent Value  SDOH Interventions   Transportation Interventions Intervention Not Indicated        Advanced Directives Status: See Care Plan for related entries.  CCM Care Plan  Allergies  Allergen Reactions   Hydrocodone Nausea Only    Noted after surgery, may be able to tolerate with food    Outpatient Encounter Medications as of 02/25/2021  Medication Sig   atenolol (TENORMIN) 25 MG tablet Take 1 tablet (25 mg total) by mouth 2 (two) times daily.   atorvastatin (LIPITOR) 40 MG tablet Take 1 tablet (40 mg total) by mouth daily.   BRILINTA 90 MG TABS tablet TAKE ONE TABLET TWICE DAILY (Patient taking differently: Take 90 mg by mouth 2 (two) times daily.)    Cholecalciferol (VITAMIN D3) 25 MCG (1000 UT) CAPS Take 1 capsule (1,000 Units total) by mouth daily.   donepezil (ARICEPT) 5 MG tablet Take 1 tablet (5 mg total) by mouth daily.   melatonin 3 MG TABS tablet Take by mouth.   OLANZapine (ZYPREXA) 10 MG tablet Take 1 tablet (10 mg total) by mouth at bedtime.   polyethylene glycol powder (GLYCOLAX/MIRALAX) 17 GM/SCOOP powder Take 17 g by mouth daily as needed for mild constipation.   sertraline (ZOLOFT) 100 MG tablet Take 1 tablet (100 mg total) by mouth daily.   thiamine 100 MG tablet Take 1 tablet (100 mg total) by mouth daily.   No facility-administered encounter medications on file as of 02/25/2021.    Patient Active Problem List   Diagnosis Date Noted   Major neurocognitive disorder ([place]) 01/14/2021   Cognitive decline 12/12/2020   GAD (generalized anxiety disorder) 12/12/2020   Adjustment disorder with mixed anxiety and depressed mood 10/27/2020   Suicidal ideations 10/27/2020   Shingles 08/30/2020   Acute CVA (cerebrovascular accident) ([place]) 04/18/2020   Seasonal and perennial allergic rhinitis 03/28/2020   HLD (hyperlipidemia) 02/16/2020   Stroke ([place]) 02/16/2020   Fall 02/16/2020   Abdominal wall hernia 10/30/2019   Change in voice 10/30/2019   History of stroke 09/14/2019   Spinal stenosis 07/24/2019   Mixed hyperlipidemia    OSA (obstructive sleep apnea)    Dysfunction of eustachian tube 05/15/2019   Other social stressor 03/29/2019   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   Diarrhea 06/20/2018   Joint pain 06/20/2018   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   Medicare annual wellness visit, subsequent 09/15/2017   SUI (stress urinary incontinence, female) 08/07/2017   Back pain 06/16/2016   Advance care planning 01/07/2016   Sleep apnea    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   Allergic rhinitis 11/24/2014   Adult BMI 30+ 11/24/2014   Depression 11/24/2014    Insomnia 08/23/2009   Headache, migraine 05/10/2009   HERPES SIMPLEX INFECTION 08/14/2006   HYPERCHOLESTEROLEMIA 08/14/2006   Anxiety state 08/14/2006   Essential hypertension 08/14/2006   HIATAL HERNIA 08/14/2006   IRRITABLE BOWEL SYNDROME 08/14/2006   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   Primary osteoarthritis of right knee 08/14/2006   PLANTAR FASCIITIS 08/14/2006    Conditions to be addressed/monitored: Anxiety, Depression, and s/p CVA's ; Level of care concerns and Lacks knowledge of community resource:    Care Plan : LCSW Plan of Care  Updates made by Deirdre Peer, LCSW since 02/25/2021 12:00 AM     Problem: Long-Term Care Planning/Needs   Priority: High  Onset Date: 02/25/2021     Long-Range Goal: Provide knowledge, resources and support for Effective Long-Term Care Planning   Start Date: 02/25/2021  Expected End Date: 05/25/2021  This Visit's Progress: On track  Priority: High  Note:   Current barriers:    Level of care concerns, Mental Health Concerns , Social Isolation, Lacks knowledge of community resource: long term care needs, and   Clinical Goals: Patient will work with CSW to address needs related to long term care, mental health  Clinical Interventions:  Assessment of needs, barriers , agencies contacted, as well as how impacting  Solution-Focused Strategies Active listening / Reflection utilized  Problem [place] stress acknowledged  Consideration of in-home help encouraged  Increase in actives / exercise encouraged  Made referral to Care Guide for follow up resources: ALF settings nearby, Adult Day Care and other as identifued  Review various resources, discussed options and provided patient information about  Referral to care guide (ALF facility options, Adult Day Care, etc )  Private pay options home health needs Long term care insurance and facility options,  1:1 collaboration with primary care provider regarding development and update of  comprehensive plan of care as evidenced by provider attestation and co-signature Inter-disciplinary care team collaboration (see longitudinal plan of care) Patient Goals/Self-Care Activities: Over the next 30 days Continue with therapy I have place a referral to the care guide for community resources, they will follow up with you   -review Assisted Living facility resources being emailed  -research Long term care insurance policy to know process, restrictions, etc -take FL2 form and other forms needed to be completed by PCP to Dr Josefine Class office -consider Adult Day Care/senior center activities - check out options for in-home help, long-term care or hospice - discuss my treatment options with the doctor or nurse            Follow Up Plan: SW will follow up with patient by phone over the next 4-6 weeks  and [place] will reach out to patient for assistance with resources.      [name] MSW, LCSW Licensed Clinical Social Worker [place] [phone]

## 2021-02-25 NOTE — Patient Instructions (Signed)
Visit Information   PATIENT GOALS:   Goals Addressed             This Visit's Progress    [place] My Quality of Life/Safety       Timeframe:  Long-Range Goal Priority:  High Start Date:       02/25/21                      Expected End Date:               05/25/21        Follow Up Date 04/01/21    -review Assisted Living facility resources being emailed  -research Long term care insurance policy to know process, restrictions, etc -take FL2 form and other forms needed to be completed by PCP to Dr Josefine Class office -consider Adult Day Care/senior center activities - check out options for in-home help, long-term care or hospice - discuss my treatment options with the doctor or nurse    Why is this important?   Having a long-term illness can be scary.  It can also be stressful for you and your caregiver.  These steps may help.    Notes:         Consent to CCM Services: [name] was given information about Chronic Care Management services including:  CCM service includes personalized support from designated clinical staff supervised by her physician, including individualized plan of care and coordination with other care providers 24/7 contact phone numbers for assistance for urgent and routine care needs. Service will only be billed when office clinical staff spend 20 minutes or more in a month to coordinate care. Only one practitioner may furnish and bill the service in a calendar month. The patient may stop CCM services at any time (effective at the end of the month) by phone call to the office staff. The patient will be responsible for cost sharing (co-pay) of up to 20% of the service fee (after annual deductible is met).  Patient agreed to services and verbal consent obtained.   The patient verbalized understanding of instructions, educational materials, and care plan provided today and declined offer to receive copy of patient instructions, educational materials, and  care plan.   Telephone follow up appointment with care management team member scheduled for:04/01/21  [name] MSW, LCSW Licensed Clinical Social Worker [place] [phone]   CLINICAL CARE PLAN: Patient Care Plan: LCSW Plan of Care     Problem Identified: Long-Term Care Planning/Needs   Priority: High  Onset Date: 02/25/2021     Long-Range Goal: Provide knowledge, resources and support for Effective Long-Term Care Planning   Start Date: 02/25/2021  Expected End Date: 05/25/2021  This Visit's Progress: On track  Priority: High  Note:   Current barriers:    Level of care concerns, Mental Health Concerns , Social Isolation, Lacks knowledge of community resource: long term care needs, and   Clinical Goals: Patient will work with CSW to address needs related to long term care, mental health  Clinical Interventions:  Assessment of needs, barriers , agencies contacted, as well as how impacting  Solution-Focused Strategies Active listening / Reflection utilized  Problem [place]  Caregiver stress acknowledged  Consideration of in-home help encouraged  Increase in actives / exercise encouraged  Made referral to Care Guide for follow up resources: ALF settings nearby, Adult Day Care and other as identifued  Review various resources, discussed options and provided patient information about  Referral  to care guide (ALF facility options, Adult Day Care, etc )  Private pay options home health needs Long term care insurance and facility options,  1:1 collaboration with primary care provider regarding development and update of comprehensive plan of care as evidenced by provider attestation and co-signature Inter-disciplinary care team collaboration (see longitudinal plan of care) Patient Goals/Self-Care Activities: Over the next 30 days Continue with therapy I have place a referral to the care guide for community resources, they will follow up with you   -review  Assisted Living facility resources being emailed  -research Long term care insurance policy to know process, restrictions, etc -take FL2 form and other forms needed to be completed by PCP to Dr Josefine Class office -consider Adult Day Care/senior center activities - check out options for in-home help, long-term care or hospice - discuss my treatment options with the doctor or nurse

## 2021-02-28 DIAGNOSIS — I4891 Unspecified atrial fibrillation: Secondary | ICD-10-CM | POA: Diagnosis not present

## 2021-02-28 DIAGNOSIS — I10 Essential (primary) hypertension: Secondary | ICD-10-CM | POA: Diagnosis not present

## 2021-02-28 DIAGNOSIS — F22 Delusional disorders: Secondary | ICD-10-CM | POA: Diagnosis not present

## 2021-02-28 DIAGNOSIS — F0390 Unspecified dementia without behavioral disturbance: Secondary | ICD-10-CM | POA: Diagnosis not present

## 2021-02-28 DIAGNOSIS — M1990 Unspecified osteoarthritis, unspecified site: Secondary | ICD-10-CM | POA: Diagnosis not present

## 2021-02-28 DIAGNOSIS — G4733 Obstructive sleep apnea (adult) (pediatric): Secondary | ICD-10-CM | POA: Diagnosis not present

## 2021-02-28 DIAGNOSIS — K219 Gastro-esophageal reflux disease without esophagitis: Secondary | ICD-10-CM | POA: Diagnosis not present

## 2021-02-28 DIAGNOSIS — K589 Irritable bowel syndrome without diarrhea: Secondary | ICD-10-CM | POA: Diagnosis not present

## 2021-02-28 DIAGNOSIS — F411 Generalized anxiety disorder: Secondary | ICD-10-CM | POA: Diagnosis not present

## 2021-03-01 ENCOUNTER — Telehealth: Payer: Self-pay

## 2021-03-01 NOTE — Telephone Encounter (Signed)
   Telephone encounter was:  Successful.  03/01/2021 Name: [name] [name] MRN: [id number] DOB: Jan 03, 1941  [name] [name] is a 80 y.o. year old female who is a primary care patient of Tonia Ghent, MD . The community resource team was consulted for assistance with  assisted living facilities and [facility] application.  Care guide performed the following interventions: Spoke with patient's daughter [name] confirmed email address [email or web address].  Emailed list of Assisted Living Facilities, PCS application and information for [employer] options counseling.  Follow Up Plan:  Care guide will follow up with patient by phone over the next 7 days.  Shanisha Lech, AAS Paralegal, [place] [facility]  [address], [place] [address] ??[email or web address]  ?? [phone]   [email or web address]

## 2021-03-01 NOTE — Telephone Encounter (Signed)
   Telephone encounter was:  Successful.  03/01/2021 Name: [name] [name] MRN: [id number] DOB: 1940/05/27  [name] [name] is a 80 y.o. year old female who is a primary care patient of Tonia Ghent, MD . The community resource team was consulted for assistance with  assisted living facilities, pcs form.  Care guide performed the following interventions: Received confirmation email was received from patient's daughter [name].  Follow Up Plan:  No further follow up planned at this time. The patient has been provided with needed resources.  Skilynn Durney, AAS Paralegal, [place] [facility]  [address], [place] [address] ??[email or web address]  ?? [phone]   [email or web address]

## 2021-03-04 ENCOUNTER — Ambulatory Visit (INDEPENDENT_AMBULATORY_CARE_PROVIDER_SITE_OTHER): Payer: Medicare HMO

## 2021-03-04 DIAGNOSIS — I639 Cerebral infarction, unspecified: Secondary | ICD-10-CM | POA: Diagnosis not present

## 2021-03-06 DIAGNOSIS — M1990 Unspecified osteoarthritis, unspecified site: Secondary | ICD-10-CM | POA: Diagnosis not present

## 2021-03-06 DIAGNOSIS — G4733 Obstructive sleep apnea (adult) (pediatric): Secondary | ICD-10-CM | POA: Diagnosis not present

## 2021-03-06 DIAGNOSIS — I4891 Unspecified atrial fibrillation: Secondary | ICD-10-CM | POA: Diagnosis not present

## 2021-03-06 DIAGNOSIS — K589 Irritable bowel syndrome without diarrhea: Secondary | ICD-10-CM | POA: Diagnosis not present

## 2021-03-06 DIAGNOSIS — F411 Generalized anxiety disorder: Secondary | ICD-10-CM | POA: Diagnosis not present

## 2021-03-06 DIAGNOSIS — F22 Delusional disorders: Secondary | ICD-10-CM | POA: Diagnosis not present

## 2021-03-06 DIAGNOSIS — K219 Gastro-esophageal reflux disease without esophagitis: Secondary | ICD-10-CM | POA: Diagnosis not present

## 2021-03-06 DIAGNOSIS — I10 Essential (primary) hypertension: Secondary | ICD-10-CM | POA: Diagnosis not present

## 2021-03-06 DIAGNOSIS — F0390 Unspecified dementia without behavioral disturbance: Secondary | ICD-10-CM | POA: Diagnosis not present

## 2021-03-06 LAB — CUP PACEART REMOTE DEVICE CHECK
Date Time Interrogation Session: 20221009231004
Implantable Pulse Generator Implant Date: 20220126

## 2021-03-13 NOTE — Progress Notes (Signed)
Carelink Summary Report / Loop Recorder 

## 2021-03-29 ENCOUNTER — Other Ambulatory Visit: Payer: Self-pay | Admitting: Family Medicine

## 2021-04-01 ENCOUNTER — Ambulatory Visit (INDEPENDENT_AMBULATORY_CARE_PROVIDER_SITE_OTHER): Payer: Medicare HMO | Admitting: *Deleted

## 2021-04-01 ENCOUNTER — Telehealth: Payer: Medicare HMO

## 2021-04-01 DIAGNOSIS — I639 Cerebral infarction, unspecified: Secondary | ICD-10-CM

## 2021-04-01 DIAGNOSIS — Z8673 Personal history of transient ischemic attack (TIA), and cerebral infarction without residual deficits: Secondary | ICD-10-CM

## 2021-04-01 NOTE — Patient Instructions (Signed)
Visit Information  The patient verbalized understanding of instructions, educational materials, and care plan provided today and declined offer to receive copy of patient instructions, educational materials, and care plan.   No further follow up required:

## 2021-04-01 NOTE — Chronic Care Management (AMB) (Signed)
Chronic Care Management    Clinical Social Work Note  04/01/2021 Name: [name] [name] MRN: [id number] DOB: 07-Feb-1941  [name] [name] is a 80 y.o. year old female who is a primary care patient of Tonia Ghent, MD. The CCM team was consulted to assist the patient with chronic disease management and/or care coordination needs related to: Level of Care Concerns.   Engaged with patient by telephone for follow up visit in response to provider referral for social work chronic care management and care coordination services.   Consent to Services:  The patient was given information about Chronic Care Management services, agreed to services, and gave verbal consent prior to initiation of services.  Please see initial visit note for detailed documentation.   Patient agreed to services and consent obtained.   Assessment: Review of patient past medical history, allergies, medications, and health status, including review of relevant consultants reports was performed today as part of a comprehensive evaluation and provision of chronic care management and care coordination services.     SDOH (Social Determinants of Health) assessments and interventions performed:    Advanced Directives Status: Not addressed in this encounter.  CCM Care Plan  Allergies  Allergen Reactions   Hydrocodone Nausea Only    Noted after surgery, may be able to tolerate with food    Outpatient Encounter Medications as of 04/01/2021  Medication Sig   atenolol (TENORMIN) 25 MG tablet Take 1 tablet (25 mg total) by mouth 2 (two) times daily.   atorvastatin (LIPITOR) 40 MG tablet Take 1 tablet (40 mg total) by mouth daily.   BRILINTA 90 MG TABS tablet TAKE ONE TABLET TWICE DAILY   Cholecalciferol (VITAMIN D3) 25 MCG (1000 UT) CAPS Take 1 capsule (1,000 Units total) by mouth daily.   donepezil (ARICEPT) 5 MG tablet Take 1 tablet (5 mg total) by mouth daily.   melatonin 3 MG TABS tablet Take by mouth.   OLANZapine (ZYPREXA)  10 MG tablet Take 1 tablet (10 mg total) by mouth at bedtime.   polyethylene glycol powder (GLYCOLAX/MIRALAX) 17 GM/SCOOP powder Take 17 g by mouth daily as needed for mild constipation.   sertraline (ZOLOFT) 100 MG tablet Take 1 tablet (100 mg total) by mouth daily.   thiamine 100 MG tablet Take 1 tablet (100 mg total) by mouth daily.   No facility-administered encounter medications on file as of 04/01/2021.    Patient Active Problem List   Diagnosis Date Noted   Major neurocognitive disorder ([place]) 01/14/2021   Cognitive decline 12/12/2020   GAD (generalized anxiety disorder) 12/12/2020   Adjustment disorder with mixed anxiety and depressed mood 10/27/2020   Suicidal ideations 10/27/2020   Shingles 08/30/2020   Acute CVA (cerebrovascular accident) ([place]) 04/18/2020   Seasonal and perennial allergic rhinitis 03/28/2020   HLD (hyperlipidemia) 02/16/2020   Stroke ([place]) 02/16/2020   Fall 02/16/2020   Abdominal wall hernia 10/30/2019   Change in voice 10/30/2019   History of stroke 09/14/2019   Spinal stenosis 07/24/2019   Mixed hyperlipidemia    OSA (obstructive sleep apnea)    Dysfunction of eustachian tube 05/15/2019   Other social stressor 03/29/2019   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   Diarrhea 06/20/2018   Joint pain 06/20/2018   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   Medicare annual wellness visit, subsequent 09/15/2017   SUI (stress urinary incontinence, female) 08/07/2017   Back pain 06/16/2016   Advance care planning 01/07/2016   Sleep apnea    Left knee DJD  06/20/2015   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   Allergic rhinitis 11/24/2014   Adult BMI 30+ 11/24/2014   Depression 11/24/2014   Insomnia 08/23/2009   Headache, migraine 05/10/2009   HERPES SIMPLEX INFECTION 08/14/2006   HYPERCHOLESTEROLEMIA 08/14/2006   Anxiety state 08/14/2006   Essential hypertension 08/14/2006   HIATAL HERNIA 08/14/2006   IRRITABLE BOWEL  SYNDROME 08/14/2006   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   Primary osteoarthritis of right knee 08/14/2006   PLANTAR FASCIITIS 08/14/2006    Conditions to be addressed/monitored:  s/p CVA ; Level of care concerns and Limited access to caregiver  Care Plan : LCSW Plan of Care  Updates made by Deirdre Peer, LCSW since 04/01/2021 12:00 AM     Problem: Long-Term Care Planning/Needs   Priority: High  Onset Date: 02/25/2021     Long-Range Goal: Provide knowledge, resources and support for Effective Long-Term Care Planning Completed 04/01/2021  Start Date: 02/25/2021  Expected End Date: 05/25/2021  This Visit's Progress: On track  Recent Progress: On track  Priority: High  Note:   Current barriers:    Level of care concerns, Mental Health Concerns , Social Isolation, Lacks knowledge of community resource: long term care needs, and   Clinical Goals: Patient will work with CSW to address needs related to long term care, mental health  Clinical Interventions:  CSW spoke with pt's daughter today who states pt is doing "much better". She is at home, living alone and thriving- daughter feels the medication changes made a positive difference.  They do not plan to pursue placement at this time- CSW will sign off. Assessment of needs, barriers , agencies contacted, as well as how impacting  Solution-Focused Strategies Active listening / Reflection utilized  Problem Solving [place] acknowledged  Consideration of in-home help encouraged  Increase in actives / exercise encouraged  Made referral to Care Guide for follow up resources: ALF settings nearby, Adult Day Care and other as identifued  Review various resources, discussed options and provided patient information about  Referral to care guide (ALF facility options, Adult Day Care, etc )  Private pay options home health needs Long term care insurance and facility options,  1:1 collaboration with primary care provider regarding  development and update of comprehensive plan of care as evidenced by provider attestation and co-signature Inter-disciplinary care team collaboration (see longitudinal plan of care) Patient Goals/Self-Care Activities: Over the next 30 days Continue with therapy I have place a referral to the care guide for community resources, they will follow up with you   -review Assisted Living facility resources being emailed  -research Long term care insurance policy to know process, restrictions, etc -take FL2 form and other forms needed to be completed by PCP to Dr Josefine Class office -consider Adult Day Care/senior center activities - check out options for in-home help, long-term care or hospice - discuss my treatment options with the doctor or nurse            Follow Up Plan:  N/A      [name] MSW, [place] Licensed Clinical Social Worker [place] [phone]

## 2021-04-04 ENCOUNTER — Ambulatory Visit (INDEPENDENT_AMBULATORY_CARE_PROVIDER_SITE_OTHER): Payer: Medicare HMO

## 2021-04-04 DIAGNOSIS — I639 Cerebral infarction, unspecified: Secondary | ICD-10-CM | POA: Diagnosis not present

## 2021-04-08 LAB — CUP PACEART REMOTE DEVICE CHECK
Date Time Interrogation Session: 20221111230413
Implantable Pulse Generator Implant Date: 20220126

## 2021-04-12 NOTE — Progress Notes (Signed)
Carelink Summary Report / Loop Recorder 

## 2021-04-16 ENCOUNTER — Ambulatory Visit: Payer: Medicare HMO | Admitting: Behavioral Health

## 2021-04-16 ENCOUNTER — Encounter: Payer: Self-pay | Admitting: Behavioral Health

## 2021-04-16 ENCOUNTER — Other Ambulatory Visit: Payer: Self-pay

## 2021-04-16 DIAGNOSIS — F331 Major depressive disorder, recurrent, moderate: Secondary | ICD-10-CM

## 2021-04-16 DIAGNOSIS — F411 Generalized anxiety disorder: Secondary | ICD-10-CM

## 2021-04-16 DIAGNOSIS — R630 Anorexia: Secondary | ICD-10-CM | POA: Diagnosis not present

## 2021-04-16 DIAGNOSIS — K5900 Constipation, unspecified: Secondary | ICD-10-CM | POA: Diagnosis not present

## 2021-04-16 DIAGNOSIS — R4189 Other symptoms and signs involving cognitive functions and awareness: Secondary | ICD-10-CM | POA: Diagnosis not present

## 2021-04-16 DIAGNOSIS — K432 Incisional hernia without obstruction or gangrene: Secondary | ICD-10-CM | POA: Diagnosis not present

## 2021-04-16 DIAGNOSIS — F22 Delusional disorders: Secondary | ICD-10-CM | POA: Diagnosis not present

## 2021-04-16 MED ORDER — OLANZAPINE 10 MG PO TABS
10.0000 mg | ORAL_TABLET | Freq: Every day | ORAL | 3 refills | Status: DC
Start: 2021-04-16 — End: 2021-07-29

## 2021-04-16 MED ORDER — SERTRALINE HCL 100 MG PO TABS: 100.0000 mg | ORAL_TABLET | Freq: Every day | ORAL | 3 refills | Status: DC

## 2021-04-16 NOTE — Progress Notes (Signed)
[facility]  Patient ID: CHELCEY CAPUTO,  MRN: [id number]  PCP: Tonia Ghent, MD  Date of Evaluation: 04/16/2021 Time spent:30 minutes  Chief Complaint:   HISTORY/CURRENT STATUS: HPI  80 year old female presents to this office for follow up and medication management. She is alert and cognitively sound today. She is smiling and energetic. Her daughter is present with her consent. She says that she has been doing very well. No paranoid thoughts anymore. Has been living independent and able to conduct all ADL's. Says that her anxiety and depression have improved significantly to manageable levels. She is not driving anymore and that make her feel bad sometimes. She says that she would like to leave her medication alone for now and just needs refills. Anxiety level today is 2/10 and depression is 2/10. She is sleeping with aid of medications 7-8 hours per night. No mania or psychosis present. No auditory or visual hallucinations. No paranoia or delusional thinking. No SI/HI.   Past failed psychiatric medications: Lexapro Zoloft Remeron Seroquel Hydroxyzine     Individual Medical History/ Review of Systems: Changes? :No   Allergies: Hydrocodone  Current Medications:  Current Outpatient Medications:    atenolol (TENORMIN) 25 MG tablet, Take 1 tablet (25 mg total) by mouth 2 (two) times daily., Disp: 180 tablet, Rfl: 1   atorvastatin (LIPITOR) 40 MG tablet, Take 1 tablet (40 mg total) by mouth daily., Disp: 90 tablet, Rfl: 3   BRILINTA 90 MG TABS tablet, TAKE ONE TABLET TWICE DAILY, Disp: 60 tablet, Rfl: 2   Cholecalciferol (VITAMIN D3) 25 MCG (1000 UT) CAPS, Take 1 capsule (1,000 Units total) by mouth daily., Disp: , Rfl:    donepezil (ARICEPT) 5 MG tablet, Take 1 tablet (5 mg total) by mouth daily., Disp: 90 tablet, Rfl: 1   melatonin 3 MG TABS tablet, Take by mouth., Disp: , Rfl:    polyethylene glycol powder (GLYCOLAX/MIRALAX) 17 GM/SCOOP powder, Take 17 g by  mouth daily as needed for mild constipation., Disp: , Rfl:    thiamine 100 MG tablet, Take 1 tablet (100 mg total) by mouth daily., Disp: 90 tablet, Rfl: 1   OLANZapine (ZYPREXA) 10 MG tablet, Take 1 tablet (10 mg total) by mouth at bedtime., Disp: 30 tablet, Rfl: 3   sertraline (ZOLOFT) 100 MG tablet, Take 1 tablet (100 mg total) by mouth daily., Disp: 30 tablet, Rfl: 3 Medication Side Effects: none  Family Medical/ Social History: Changes? No  MENTAL HEALTH EXAM:  There were no vitals taken for this visit.There is no height or weight on file to calculate BMI.  General Appearance: Casual and Neat  Eye Contact:  Good  Speech:  Clear and Coherent  Volume:  Normal  Mood:  NA  Affect:  Appropriate  Thought Process:  Coherent  Orientation:  Full (Time, Place, and Person)  Thought Content: Logical   Suicidal Thoughts:  No  Homicidal Thoughts:  No  Memory:  WNL  Judgement:  Good  Insight:  Good  Psychomotor Activity:  Normal  Concentration:  Concentration: Good  Recall:  Good  Fund of Knowledge: Good  Language: Good  Assets:  Desire for Improvement  ADL's:  Intact  Cognition: WNL  Prognosis:  Good    DIAGNOSES:    ICD-10-CM   1. Generalized anxiety disorder  F41.1 OLANZapine (ZYPREXA) 10 MG tablet    sertraline (ZOLOFT) 100 MG tablet    2. Cognitive decline  R41.89 OLANZapine (ZYPREXA) 10 MG tablet    sertraline (  ZOLOFT) 100 MG tablet    3. Major depressive disorder, recurrent episode, moderate (HCC)  F33.1 OLANZapine (ZYPREXA) 10 MG tablet    sertraline (ZOLOFT) 100 MG tablet    4. Lack of appetite  R63.0 OLANZapine (ZYPREXA) 10 MG tablet    sertraline (ZOLOFT) 100 MG tablet      Receiving Psychotherapy: No    RECOMMENDATIONS:   To continue prescribed Zyprexa 10 mg daily To continue Zoloft  100 mg daily. Will report worsening symptoms, acute further decline in cognition or behaviors. Provided after hours emergency contact information Discussed potential  metabolic side effects associated with atypical antipsychotics, as well as potential risk for movement side effects. Advised pt to contact office if movement side effects occur.  Greater than 50% of 30 min. face to face time with patient was spent on counseling and coordination of care. We discussed her recent improvement with anxiety and depression. Paranoia and delusional thinking has been absent for a couple of months. Daughter indicated that no medication changes desired at this time. Dicussed the possibility of lowering her Abilify to 5 mg next visit if she is still stable. Discussed potential metabolic side effects associated with atypical antipsychotics, as well as potential risk for movement side effects. Advised pt to contact office if movement side effects occur.      Elwanda Brooklyn, NP

## 2021-04-24 ENCOUNTER — Other Ambulatory Visit: Payer: Self-pay | Admitting: Surgery

## 2021-04-24 DIAGNOSIS — I639 Cerebral infarction, unspecified: Secondary | ICD-10-CM

## 2021-04-24 DIAGNOSIS — K432 Incisional hernia without obstruction or gangrene: Secondary | ICD-10-CM

## 2021-04-25 ENCOUNTER — Other Ambulatory Visit: Payer: Self-pay | Admitting: Surgery

## 2021-04-25 DIAGNOSIS — K432 Incisional hernia without obstruction or gangrene: Secondary | ICD-10-CM

## 2021-04-25 DIAGNOSIS — K5900 Constipation, unspecified: Secondary | ICD-10-CM

## 2021-04-29 NOTE — Progress Notes (Signed)
Subjective:   [name] [name] is a 80 y.o. female who presents for Medicare Annual (Subsequent) preventive examination.  I connected with [name] [name] today by telephone and verified that I am speaking with the correct person using two identifiers. Location patient: home Location provider: work Persons participating in the virtual visit: patient, Marine scientist.    I discussed the limitations, risks, security and privacy concerns of performing an evaluation and management service by telephone and the availability of in person appointments. I also discussed with the patient that there may be a patient responsible charge related to this service. The patient expressed understanding and verbally consented to this telephonic visit.    Interactive audio and video telecommunications were attempted between this provider and patient, however failed, due to patient having technical difficulties OR patient did not have access to video capability.  We continued and completed visit with audio only.  Some vital signs may be absent or patient reported.   Time Spent with patient on telephone encounter: 30 minutes  Review of Systems     Cardiac Risk Factors include: advanced age (>42men, >76 women);hypertension     Objective:    Today's Vitals   04/30/21 0946  Weight: 124 lb (56.2 kg)  Height: 4\' 11"  (1.499 m)   Body mass index is 25.04 kg/m.  Advanced Directives 04/30/2021 10/26/2020 04/18/2020 02/17/2020 12/07/2019 11/02/2019 07/12/2019  Does Patient Have a Medical Advance Directive? Yes No Yes Yes Yes Yes Yes  Type of Paramedic of [place];Living will - [place];Living will [place];Living will - [place];Living will [place];Living will  Does patient want to make changes to medical advance directive? Yes (MAU/Ambulatory/Procedural Areas - Information given) - No - Patient declined No - Patient declined Yes  (Inpatient - patient defers changing a medical advance directive at this time - Information given) - No - Patient declined  Copy of [place] in Chart? - - No - copy requested No - copy requested - No - copy requested No - copy requested  Would patient like information on creating a medical advance directive? - - - - - - No - Patient declined    Current Medications (verified) Outpatient Encounter Medications as of 04/30/2021  Medication Sig   atenolol (TENORMIN) 25 MG tablet Take 1 tablet (25 mg total) by mouth 2 (two) times daily.   atorvastatin (LIPITOR) 40 MG tablet Take 1 tablet (40 mg total) by mouth daily.   BRILINTA 90 MG TABS tablet TAKE ONE TABLET TWICE DAILY   Cholecalciferol (VITAMIN D3) 25 MCG (1000 UT) CAPS Take 1 capsule (1,000 Units total) by mouth daily.   donepezil (ARICEPT) 5 MG tablet Take 1 tablet (5 mg total) by mouth daily.   melatonin 3 MG TABS tablet Take by mouth.   OLANZapine (ZYPREXA) 10 MG tablet Take 1 tablet (10 mg total) by mouth at bedtime.   polyethylene glycol powder (GLYCOLAX/MIRALAX) 17 GM/SCOOP powder Take 17 g by mouth daily as needed for mild constipation.   sertraline (ZOLOFT) 100 MG tablet Take 1 tablet (100 mg total) by mouth daily.   thiamine 100 MG tablet Take 1 tablet (100 mg total) by mouth daily.   No facility-administered encounter medications on file as of 04/30/2021.    Allergies (verified) Hydrocodone   History: Past Medical History:  Diagnosis Date   Arthritis    knees   Depression    Fatty liver    GERD (  gastroesophageal reflux disease)    Hyperlipidemia    Hypertension    IBS (irritable bowel syndrome)    Obesity    Pneumonia    Sleep apnea    uses CPAP   Stroke (cerebrum) [facility])    Past Surgical History:  Procedure Laterality Date   BLADDER SURGERY     BREAST SURGERY     breast biopsy   BROW LIFT Bilateral 04/14/2017   Procedure: BLEPHAROPLASTY UPPER EYELID WITH EXCESS SKIN;  Surgeon: Karle Starch,  MD;  Location: [place];  Service: Ophthalmology;  Laterality: Bilateral;   CATARACT EXTRACTION W/PHACO Left 02/05/2016   Procedure: CATARACT EXTRACTION PHACO AND INTRAOCULAR LENS PLACEMENT ([place]);  Surgeon: Birder Robson, MD;  Location: [facility];  Service: Ophthalmology;  Laterality: Left;  [place] 01:10 AP% 22.3 CDE 15.67 Fluid pack lot # [id number]   CATARACT EXTRACTION W/PHACO Right 02/26/2016   Procedure: CATARACT EXTRACTION PHACO AND INTRAOCULAR LENS PLACEMENT ([facility]);  Surgeon: Birder Robson, MD;  Location: [facility];  Service: Ophthalmology;  Laterality: Right;  [place] 57.4 AP% 24.0 CDE 13.75 Fluid Pack lot # [id number]   CHOLECYSTECTOMY     COLONOSCOPY WITH PROPOFOL N/A 12/18/2014   Procedure: COLONOSCOPY WITH PROPOFOL;  Surgeon: Manya Silvas, MD;  Location: [facility] ENDOSCOPY;  Service: Endoscopy;  Laterality: N/A;   DEEP NECK LYMPH NODE BIOPSY / EXCISION     implantable loop recorder implantation  06/20/2020   Medtronic Reveal Linq model LNQ 22 ([place] [id number]) implantable loop recorder for cryptogenic stroke   JOINT REPLACEMENT     KNEE ARTHROPLASTY Left 06/20/2015   Procedure: COMPUTER ASSISTED TOTAL KNEE ARTHROPLASTY;  Surgeon: Dereck Leep, MD;  Location: [facility];  Service: Orthopedics;  Laterality: Left;   PTOSIS REPAIR Bilateral 04/14/2017   Procedure: PTOSIS REPAIR RESECT EX;  Surgeon: Karle Starch, MD;  Location: [place];  Service: Ophthalmology;  Laterality: Bilateral;  sleep apnea   TONSILLECTOMY     TOTAL HIP ARTHROPLASTY Right 07/21/2018   Procedure: TOTAL HIP ARTHROPLASTY ANTERIOR APPROACH;  Surgeon: Gaynelle Arabian, MD;  Location: [facility];  Service: Orthopedics;  Laterality: Right;   Family History  Problem Relation Age of Onset   Heart attack Mother    Dementia Father    Heart attack Father 59   Aortic aneurysm Brother    Stroke Paternal Grandfather    Colon cancer Neg Hx    Breast cancer Neg Hx    Social History   Socioeconomic History    Marital status: Widowed    Spouse name: Not on file   Number of children: Not on file   Years of education: Not on file   Highest education level: Not on file  Occupational History   Occupation: retired  Tobacco Use   Smoking status: Never   Smokeless tobacco: Never  Vaping Use   Vaping Use: Never used  Substance and Sexual Activity   Alcohol use: Not Currently    Comment: ocassional glass of wine 2-3 times a year   Drug use: No   Sexual activity: Never  Other Topics Concern   Not on file  Social History Narrative   Lives alone in home   Right Handed   Drinks 1-2 cups caffeine daily   Social Determinants of Health   Financial Resource Strain: Low Risk    Difficulty of Paying Living Expenses: Not hard at all  Food Insecurity: No Food Insecurity   Worried About [place] in the Last Year: Never true  Ran Out of Food in the Last Year: Never true  Transportation Needs: No Transportation Needs   Lack of Transportation (Medical): No   Lack of Transportation (Non-Medical): No  Physical Activity: Insufficiently Active   Days of Exercise per Week: 3 days   Minutes of Exercise per Session: 10 min  Stress: No Stress Concern Present   Feeling of Stress : Not at all  Social Connections: Socially Isolated   Frequency of Communication with Friends and Family: More than three times a week   Frequency of Social Gatherings with Friends and Family: More than three times a week   Attends Religious Services: Never   Marine scientist or Organizations: No   Attends Archivist Meetings: Never   Marital Status: Widowed    Tobacco Counseling Counseling given: Not Answered   Clinical Intake:  Pre-visit preparation completed: Yes  Pain : No/denies pain     BMI - recorded: 25.03 Nutritional Status: BMI 25 -29 Overweight Nutritional Risks: None Diabetes: No  How often do you need to have someone help you when you read instructions, pamphlets, or other  written materials from your doctor or pharmacy?: 1 - Never  Diabetic?No  Interpreter Needed?: No  Information entered by :: Orrin Brigham LPN   Activities of Daily Living In your present state of health, do you have any difficulty performing the following activities: 04/30/2021  Hearing? N  Vision? N  Difficulty concentrating or making decisions? N  Walking or climbing stairs? Y  Comment uses walker  Dressing or bathing? N  Doing errands, shopping? Y  Comment children assists  Preparing Food and eating ? N  Using the Toilet? N  In the past six months, have you accidently leaked urine? N  Do you have problems with loss of bowel control? Y  Comment patient states only when having diarrhea  Managing your Medications? N  Comment daughter assist  Managing your Finances? N  Housekeeping or managing your Housekeeping? N  Some recent data might be hidden    Patient Care Team: Tonia Ghent, MD as PCP - General (Family Medicine) Birder Robson, MD as Referring Physician (Ophthalmology) Marry Guan Laurice Record, MD as Consulting Physician (Orthopedic Surgery) Garvin Fila, MD as Consulting Physician (Neurology)  Indicate any recent Medical Services you may have received from other than [facility] providers in the past year (date may be approximate).     Assessment:   This is a routine wellness examination for [name] [name].  Hearing/Vision screen Hearing Screening - Comments:: No issues Vision Screening - Comments:: Last exam 2021, Dr. Lenis Dickinson, plans to make an appointment  Dietary issues and exercise activities discussed: Current Exercise Habits: Home exercise routine, Type of exercise: Other - see comments;stretching (rides stationary bike), Time (Minutes): 10 (patient states trying to exercise for at least 5 minutes), Frequency (Times/Week): 3, Weekly Exercise (Minutes/Week): 30, Intensity: Mild   Goals Addressed             This Visit's Progress    Patient Stated       Would  like to increase fluids       Depression Screen PHQ 2/9 Scores 04/30/2021 01/24/2021 07/26/2019 12/16/2018 08/17/2017 08/21/2016 08/14/2016  PHQ - 2 Score 0 6 1 0 0 0 0  PHQ- 9 Score - 20 - - 0 - -  Exception Documentation - - - - - - -    Fall Risk Fall Risk  04/30/2021 12/16/2018 08/17/2017 08/21/2016 08/14/2016  Falls in the past year? 1  0 No No No  Number falls in past yr: 0 - - - -  Injury with Fall? 0 - - - -  Comment Patient states falling in the shower while inpatient @ [facility] for fall due to : Other (Comment) - - - -  Risk for fall due to: Comment fell in the shower - - - -  Follow up Falls prevention discussed - - - -    Advance:  Any stairs in or around the home? No  If so, are there any without handrails? Yes  Home free of loose throw rugs in walkways, pet beds, electrical cords, etc? Yes  Adequate lighting in your home to reduce risk of falls? Yes   ASSISTIVE DEVICES UTILIZED TO PREVENT FALLS:  Life alert? No  Use of a cane, walker or w/c? Yes , walker Grab bars in the bathroom? Yes  Shower chair or bench in shower? Yes  Elevated toilet seat or a handicapped toilet? Yes   TIMED UP AND GO:  Was the test performed? No , visit completed over the phone.   Cognitive Function: Normal cognitive status assessed by this Nurse Health Advisor. No abnormalities found.   MMSE - Mini Mental State Exam 02/04/2021 08/17/2017 08/14/2016  Orientation to time 5 5 5   Orientation to Place 5 5 5   Registration 3 3 3   Attention/ Calculation 2 0 0  Recall 1 3 3   Language- name 2 objects 2 0 0  Language- repeat 1 1 1   Language- follow 3 step command 3 3 3   Language- read & follow direction 1 0 0  Write a sentence 1 0 0  Copy design 1 0 0  Total score 25 20 20         Immunizations Immunization History  Administered Date(s) Administered   Influenza Inj Mdck Quad Pf 03/16/2018   Influenza Whole 05/09/2004   Influenza, High Dose  Seasonal PF 04/01/2017, 03/23/2019   Influenza, Seasonal, Injecte, Preservative Fre 03/09/2016   Influenza,inj,Quad PF,6+ Mos 02/06/2014   Influenza,inj,quad, With Preservative 03/31/2017   Influenza-Unspecified 01/24/2014, 03/16/2018, 03/23/2019   PFIZER(Purple Top)SARS-COV-2 Vaccination 06/06/2019, 06/25/2019   PPD Test 01/09/2021   Pneumococcal Conjugate-13 04/25/2014   Pneumococcal Polysaccharide-23 03/30/2007   Pneumococcal-Unspecified 09/17/2007   Td 10/24/2005   Tdap 01/18/2016   Zoster, Live 05/26/2008    TDAP status: Up to date  Flu Vaccine status: Due, Education has been provided regarding the importance of this vaccine. Advised may receive this vaccine at local pharmacy or Health Dept. Aware to provide a copy of the vaccination record if obtained from local pharmacy or Health Dept. Verbalized acceptance and understanding.  Pneumococcal vaccine status: Up to date  Covid-19 vaccine status: Declined, Education has been provided regarding the importance of this vaccine but patient still declined. Advised may receive this vaccine at local pharmacy or Health Dept.or vaccine clinic. Aware to provide a copy of the vaccination record if obtained from local pharmacy or Health Dept. Verbalized acceptance and understanding.  Qualifies for Shingles Vaccine? Yes   Zostavax completed Yes   Shingrix Completed?: No.    Education has been provided regarding the importance of this vaccine. Patient has been advised to call insurance company to determine out of pocket expense if they have not yet received this vaccine. Advised may also receive vaccine at local pharmacy or Health Dept. Verbalized acceptance and understanding.  Screening Tests Health Maintenance  Topic Date Due  Hepatitis C Screening  Never done   Zoster Vaccines- Shingrix (1 of 2) Never done   Pneumonia Vaccine 41+ Years old (3 - PPSV23 if available, else PCV20) 04/26/2015   COVID-19 Vaccine (3 - Pfizer risk series) 07/23/2019    INFLUENZA VACCINE  12/24/2020   MAMMOGRAM  01/11/2021   TETANUS/TDAP  01/17/2026   DEXA SCAN  Completed   HPV VACCINES  Aged Out    Health Maintenance  Health Maintenance Due  Topic Date Due   Hepatitis C Screening  Never done   Zoster Vaccines- Shingrix (1 of 2) Never done   Pneumonia Vaccine 83+ Years old (3 - PPSV23 if available, else PCV20) 04/26/2015   COVID-19 Vaccine (3 - Pfizer risk series) 07/23/2019   INFLUENZA VACCINE  12/24/2020   MAMMOGRAM  01/11/2021    Colorectal cancer screening: No longer required.   Mammogram status: due, last completed 01/12/20, patient declined today.  Bone Density status: due, last completed 01/23/20, patient declined today.   Lung Cancer Screening: (Low Dose CT Chest recommended if Age 74-80 years, 30 pack-year currently smoking OR have quit w/in 15years.) does not qualify.     Additional Screening:  Hepatitis C Screening: does not qualify  Vision Screening: Recommended annual ophthalmology exams for early detection of glaucoma and other disorders of the eye. Is the patient up to date with their annual eye exam?  No , patient plans on scheduling an appointment.  Who is the provider or what is the name of the office in which the patient attends annual eye exams? Dr. Percell [place]  Dental Screening: Recommended annual dental exams for proper oral hygiene  Community Resource Referral / Chronic Care Management: CRR required this visit?  No   CCM required this visit?  No      Plan:     I have personally reviewed and noted the following in the patient's chart:   Medical and social history Use of alcohol, tobacco or illicit drugs  Current medications and supplements including opioid prescriptions.  Functional ability and status Nutritional status Physical activity Advanced directives List of other physicians Hospitalizations, surgeries, and ER visits in previous 12 months Vitals Screenings to include cognitive, depression, and  falls Referrals and appointments  In addition, I have reviewed and discussed with patient certain preventive protocols, quality metrics, and best practice recommendations. A written personalized care plan for preventive services as well as general preventive health recommendations were provided to patient.   Due to this being a telephonic visit, the after visit summary with patients personalized plan was offered to patient via mail or my-chart. Patient would like to access on my-chart.    Loma Messing, LPN   [id number]   Nurse Health Advisor   Nurse Notes: none

## 2021-04-30 ENCOUNTER — Ambulatory Visit (INDEPENDENT_AMBULATORY_CARE_PROVIDER_SITE_OTHER): Payer: Medicare HMO

## 2021-04-30 VITALS — Ht 59.0 in | Wt 124.0 lb

## 2021-04-30 DIAGNOSIS — Z0000 Encounter for general adult medical examination without abnormal findings: Secondary | ICD-10-CM

## 2021-04-30 NOTE — Patient Instructions (Signed)
Ms. Kuck , Thank you for taking time to complete your Medicare Wellness Visit. I appreciate your ongoing commitment to your health goals. Please review the following plan we discussed and let me know if I can assist you in the future.   Screening recommendations/referrals: Colonoscopy: no longer required Mammogram: due, last completed 01/12/20,Declined today, please call office to schedule if you change your mind Bone Density: due, last completed 01/23/20,Declined today, please call office to schedule if you change your mind  Recommended yearly ophthalmology/optometry visit for glaucoma screening and checkup Recommended yearly dental visit for hygiene and checkup  Vaccinations: Influenza vaccine: Due-May obtain vaccine at our office or your local pharmacy. Pneumococcal vaccine: up to date Tdap vaccine: up to date, completed 01/18/16, due 01/17/26 Shingles vaccine: Discuss with your local pharmacy if you change your mind Covid-19: newest booster available at your local pharmacy  Advanced directives: Please bring a copy of Living Will and/or [place] for your chart.   Conditions/risks identified: see problem list  Next appointment: Follow up in one year for your annual wellness visit 05/01/22 @ 9:45am, this will be a telephone visit   Preventive Care 65 Years and Older, Female Preventive care refers to lifestyle choices and visits with your health care provider that can promote health and wellness. What does preventive care include? A yearly physical exam. This is also called an annual well check. Dental exams once or twice a year. Routine eye exams. Ask your health care provider how often you should have your eyes checked. Personal lifestyle choices, including: Daily care of your teeth and gums. Regular physical activity. Eating a healthy diet. Avoiding tobacco and drug use. Limiting alcohol use. Practicing safe sex. Taking low-dose aspirin every day. Taking  vitamin and mineral supplements as recommended by your health care provider. What happens during an annual well check? The services and screenings done by your health care provider during your annual well check will depend on your age, overall health, lifestyle risk factors, and family history of disease. Counseling  Your health care provider may ask you questions about your: Alcohol use. Tobacco use. Drug use. Emotional well-being. Home and relationship well-being. Sexual activity. Eating habits. History of falls. Memory and ability to understand (cognition). Work and work Statistician. Reproductive health. Screening  You may have the following tests or measurements: Height, weight, and BMI. Blood pressure. Lipid and cholesterol levels. These may be checked every 5 years, or more frequently if you are over 4 years old. Skin check. Lung cancer screening. You may have this screening every year starting at age 20 if you have a 30-pack-year history of smoking and currently smoke or have quit within the past 15 years. Fecal occult blood test (FOBT) of the stool. You may have this test every year starting at age 37. Flexible sigmoidoscopy or colonoscopy. You may have a sigmoidoscopy every 5 years or a colonoscopy every 10 years starting at age 43. Hepatitis C blood test. Hepatitis B blood test. Sexually transmitted disease (STD) testing. Diabetes screening. This is done by checking your blood sugar (glucose) after you have not eaten for a while (fasting). You may have this done every 1-3 years. Bone density scan. This is done to screen for osteoporosis. You may have this done starting at age 24. Mammogram. This may be done every 1-2 years. Talk to your health care provider about how often you should have regular mammograms. Talk with your health care provider about your test results, treatment options, and if  necessary, the need for more tests. Vaccines  Your health care provider may  recommend certain vaccines, such as: Influenza vaccine. This is recommended every year. Tetanus, diphtheria, and acellular pertussis (Tdap, Td) vaccine. You may need a Td booster every 10 years. Zoster vaccine. You may need this after age 68. Pneumococcal 13-valent conjugate (PCV13) vaccine. One dose is recommended after age 21. Pneumococcal polysaccharide (PPSV23) vaccine. One dose is recommended after age 18. Talk to your health care provider about which screenings and vaccines you need and how often you need them. This information is not intended to replace advice given to you by your health care provider. Make sure you discuss any questions you have with your health care provider. Document Released: 06/08/2015 Document Revised: 01/30/2016 Document Reviewed: 03/13/2015 Elsevier Interactive Patient Education  2017 [place] Prevention in the Home Falls can cause injuries. They can happen to people of all ages. There are many things you can do to make your home safe and to help prevent falls. What can I do on the outside of my home? Regularly fix the edges of walkways and driveways and fix any cracks. Remove anything that might make you trip as you walk through a door, such as a raised step or threshold. Trim any bushes or trees on the path to your home. Use bright outdoor lighting. Clear any walking paths of anything that might make someone trip, such as rocks or tools. Regularly check to see if handrails are loose or broken. Make sure that both sides of any steps have handrails. Any raised decks and porches should have guardrails on the edges. Have any leaves, snow, or ice cleared regularly. Use sand or salt on walking paths during winter. Clean up any spills in your garage right away. This includes oil or grease spills. What can I do in the bathroom? Use night lights. Install grab bars by the toilet and in the tub and shower. Do not use towel bars as grab bars. Use non-skid  mats or decals in the tub or shower. If you need to sit down in the shower, use a plastic, non-slip stool. Keep the floor dry. Clean up any water that spills on the floor as soon as it happens. Remove soap buildup in the tub or shower regularly. Attach bath mats securely with double-sided non-slip rug tape. Do not have throw rugs and other things on the floor that can make you trip. What can I do in the bedroom? Use night lights. Make sure that you have a light by your bed that is easy to reach. Do not use any sheets or blankets that are too big for your bed. They should not hang down onto the floor. Have a firm chair that has side arms. You can use this for support while you get dressed. Do not have throw rugs and other things on the floor that can make you trip. What can I do in the kitchen? Clean up any spills right away. Avoid walking on wet floors. Keep items that you use a lot in easy-to-reach places. If you need to reach something above you, use a strong step stool that has a grab bar. Keep electrical cords out of the way. Do not use floor polish or wax that makes floors slippery. If you must use wax, use non-skid floor wax. Do not have throw rugs and other things on the floor that can make you trip. What can I do with my stairs? Do not leave any items  on the stairs. Make sure that there are handrails on both sides of the stairs and use them. Fix handrails that are broken or loose. Make sure that handrails are as long as the stairways. Check any carpeting to make sure that it is firmly attached to the stairs. Fix any carpet that is loose or worn. Avoid having throw rugs at the top or bottom of the stairs. If you do have throw rugs, attach them to the floor with carpet tape. Make sure that you have a light switch at the top of the stairs and the bottom of the stairs. If you do not have them, ask someone to add them for you. What else can I do to help prevent falls? Wear shoes  that: Do not have high heels. Have rubber bottoms. Are comfortable and fit you well. Are closed at the toe. Do not wear sandals. If you use a stepladder: Make sure that it is fully opened. Do not climb a closed stepladder. Make sure that both sides of the stepladder are locked into place. Ask someone to hold it for you, if possible. Clearly mark and make sure that you can see: Any grab bars or handrails. First and last steps. Where the edge of each step is. Use tools that help you move around (mobility aids) if they are needed. These include: Canes. Walkers. Scooters. Crutches. Turn on the lights when you go into a dark area. Replace any light bulbs as soon as they burn out. Set up your furniture so you have a clear path. Avoid moving your furniture around. If any of your floors are uneven, fix them. If there are any pets around you, be aware of where they are. Review your medicines with your doctor. Some medicines can make you feel dizzy. This can increase your chance of falling. Ask your doctor what other things that you can do to help prevent falls. This information is not intended to replace advice given to you by your health care provider. Make sure you discuss any questions you have with your health care provider. Document Released: 03/08/2009 Document Revised: 10/18/2015 Document Reviewed: 06/16/2014 Elsevier Interactive Patient Education  2017 [employer].

## 2021-05-02 ENCOUNTER — Ambulatory Visit
Admission: RE | Admit: 2021-05-02 | Discharge: 2021-05-02 | Disposition: A | Discharge status: Home / Self Care | Payer: Medicare HMO | Source: Ambulatory Visit | Attending: Surgery | Admitting: Surgery

## 2021-05-02 ENCOUNTER — Other Ambulatory Visit: Payer: Self-pay

## 2021-05-02 DIAGNOSIS — K5900 Constipation, unspecified: Secondary | ICD-10-CM

## 2021-05-02 DIAGNOSIS — R1013 Epigastric pain: Secondary | ICD-10-CM | POA: Diagnosis not present

## 2021-05-02 DIAGNOSIS — K432 Incisional hernia without obstruction or gangrene: Secondary | ICD-10-CM

## 2021-05-03 NOTE — Progress Notes (Signed)
CT scan shows only adipose (fatty) tissue in the hernia, no involvement of the bowel.  No cause identified for constipation symptoms.  At this time, no acute reason to proceed with epigastric hernia repair.  Will plan to continue to monitor.  Patient to notify me if symptoms change.  [place], MD [facility] [place] [facility] Office: [phone]

## 2021-05-06 ENCOUNTER — Ambulatory Visit (INDEPENDENT_AMBULATORY_CARE_PROVIDER_SITE_OTHER): Payer: Medicare HMO

## 2021-05-06 DIAGNOSIS — I639 Cerebral infarction, unspecified: Secondary | ICD-10-CM

## 2021-05-09 LAB — CUP PACEART REMOTE DEVICE CHECK
Date Time Interrogation Session: 20221214230454
Implantable Pulse Generator Implant Date: 20220126

## 2021-05-14 NOTE — Progress Notes (Signed)
Carelink Summary Report / Loop Recorder 

## 2021-05-23 ENCOUNTER — Telehealth: Payer: Self-pay | Admitting: Behavioral Health

## 2021-05-23 NOTE — Telephone Encounter (Signed)
Pt currently takes 100 mg daily.Requesting increase due to depression symptoms

## 2021-05-23 NOTE — Telephone Encounter (Signed)
That might be reasonable to consider but takes 3 weeks to help.  This decision should be made by her medical provider who should be back on Monday.  No changes until then.

## 2021-05-23 NOTE — Telephone Encounter (Signed)
Pt's daughter (on [place]) LVM asking if the pt's dosage of Sertraline can be increased?  Pt's daughter said she is noticing some depression creeping back in.   Next appt. 2/21

## 2021-05-24 ENCOUNTER — Other Ambulatory Visit: Payer: Self-pay | Admitting: Behavioral Health

## 2021-05-24 DIAGNOSIS — R4189 Other symptoms and signs involving cognitive functions and awareness: Secondary | ICD-10-CM

## 2021-05-24 DIAGNOSIS — F331 Major depressive disorder, recurrent, moderate: Secondary | ICD-10-CM

## 2021-05-24 DIAGNOSIS — R630 Anorexia: Secondary | ICD-10-CM

## 2021-05-24 DIAGNOSIS — F411 Generalized anxiety disorder: Secondary | ICD-10-CM

## 2021-05-24 MED ORDER — SERTRALINE HCL 100 MG PO TABS: 150.0000 mg | ORAL_TABLET | Freq: Every day | ORAL | 2 refills | Status: DC

## 2021-05-24 NOTE — Progress Notes (Signed)
Increased Zoloft 100 mg daily to 150 mg daily. Changes escribed to pharmacy.

## 2021-05-24 NOTE — Telephone Encounter (Signed)
Spoke with patients daughter and caregiver [name],  we did agree to increase her Zoloft to 150 mg daily. Thanks all for your help.

## 2021-06-06 ENCOUNTER — Ambulatory Visit (INDEPENDENT_AMBULATORY_CARE_PROVIDER_SITE_OTHER): Payer: Medicare HMO

## 2021-06-06 ENCOUNTER — Ambulatory Visit: Payer: Medicare HMO | Admitting: Nurse Practitioner

## 2021-06-06 DIAGNOSIS — I639 Cerebral infarction, unspecified: Secondary | ICD-10-CM

## 2021-06-06 LAB — CUP PACEART REMOTE DEVICE CHECK
Date Time Interrogation Session: 20230111230718
Implantable Pulse Generator Implant Date: 20220126

## 2021-06-18 NOTE — Progress Notes (Signed)
Carelink Summary Report / Loop Recorder 

## 2021-06-20 ENCOUNTER — Ambulatory Visit: Payer: Medicare HMO | Admitting: Adult Health

## 2021-06-25 ENCOUNTER — Other Ambulatory Visit: Payer: Self-pay | Admitting: Family Medicine

## 2021-06-27 ENCOUNTER — Telehealth: Payer: Self-pay | Admitting: Family Medicine

## 2021-06-27 NOTE — Chronic Care Management (AMB) (Signed)
°  Chronic Care Management   Note  06/27/2021 Name: [name] [name] MRN: [id number] DOB: July 21, 1940  [name] [name] is a 81 y.o. year old female who is a primary care patient of Tonia Ghent, MD. I reached out to Norman Herrlich by phone today in response to a referral sent by Ms. Sherlynn Stalls PCP, Tonia Ghent, MD.   Ms. Devoss was given information about Chronic Care Management services today including:  CCM service includes personalized support from designated clinical staff supervised by her physician, including individualized plan of care and coordination with other care providers 24/7 contact phone numbers for assistance for urgent and routine care needs. Service will only be billed when office clinical staff spend 20 minutes or more in a month to coordinate care. Only one practitioner may furnish and bill the service in a calendar month. The patient may stop CCM services at any time (effective at the end of the month) by phone call to the office staff.   Patient agreed to services and verbal consent obtained.   Follow up plan:   Tatjana Secretary/administrator

## 2021-07-12 ENCOUNTER — Telehealth: Payer: Self-pay

## 2021-07-12 NOTE — Chronic Care Management (AMB) (Signed)
Chronic Care Management Pharmacy Assistant   Name: [name] [name]  MRN: [id number] DOB: July 05, 1940  [name] [name] is an 81 y.o. year old female who presents for his initial CCM visit with the clinical pharmacist.  Reason for Encounter: Initial Questions   Conditions to be addressed/monitored: HTN and HLD    Recent office visits:  04/01/21-Family Medicine-[name] [name] MSW,LCSW-Telemedicine-assessment complete. 02/25/21-Family Medicine- Telemedicine social worker call assessment. 01/24/21-PCP-[name] Duncan,MD-Patient presented for hospital follow up.Reasonable to continue her baseline medications, use MiraLAX as needed and add on vitamin D.  Recent consult visits:  06/06/21-Cardiology-remote pacemaker check(monthly check) 05/06/21-Internal Medicine-no data found 04/16/21-General Surgery-[name] Gerkin,MD-Patient presented for follow up hernia surgery.recommended that she begin to take MiraLAX or the generic equivalent every day. I have recommended that she begin with a half dose daily. She may then adjust the dosage to effect. CT-scan ordered 04/16/21-Behavioral Health-no data found 03/06/21-Home Health Services-no data found- weekly visits 02/04/21-Neurology-[name] McCue,NP-Patient presented for follow up stroke.Continue Brilinta (ticagrelor) 90 mg bid  and atorvastatin 20mg  daily  for secondary stroke prevention-Update me about your weight in about 1 week.  01/17/21-Behavioral Health-No data found  Hospital visits:  Medication Reconciliation was completed by comparing discharge summary, patients EMR and Pharmacy list, and upon discussion with patient.  Admitted to the hospital on 12/11/20 due to weakness and paronoia. Discharge date was 01/15/21. Discharged from [facility].    Medication Changes while hospitalized: Atorvastatin decreased to 20mg  daily while hospitalized given age, SAH, low LDL, and c/f neurocognitive disease. Outpatient provider can decide ongoing plan.    Medications that remain the same after Hospital Discharge:??  Psychotropic medication regimen on discharge: - Sertraline 100mg  daily - Olanzapine 10mg  nightly - Donepezil 5mg  daily - Melatonin 3mg  nightly STOP taking these medications       QUEtiapine 50 MG tablet  Start Taking: thiamine mononitrate (vit B1) 100 mg Tab tablet Take 1 tablet (100 mg total) by mouth daily.  Medications: Outpatient Encounter Medications as of 07/12/2021  Medication Sig   atenolol (TENORMIN) 25 MG tablet Take 1 tablet (25 mg total) by mouth 2 (two) times daily.   atorvastatin (LIPITOR) 40 MG tablet Take 1 tablet (40 mg total) by mouth daily.   BRILINTA 90 MG TABS tablet TAKE ONE TABLET TWICE DAILY   Cholecalciferol (VITAMIN D3) 25 MCG (1000 UT) CAPS Take 1 capsule (1,000 Units total) by mouth daily.   donepezil (ARICEPT) 5 MG tablet Take 1 tablet (5 mg total) by mouth daily.   melatonin 3 MG TABS tablet Take by mouth.   OLANZapine (ZYPREXA) 10 MG tablet Take 1 tablet (10 mg total) by mouth at bedtime.   polyethylene glycol powder (GLYCOLAX/MIRALAX) 17 GM/SCOOP powder Take 17 g by mouth daily as needed for mild constipation.   sertraline (ZOLOFT) 100 MG tablet Take 1.5 tablets (150 mg total) by mouth daily.   thiamine 100 MG tablet Take 1 tablet (100 mg total) by mouth daily.   No facility-administered encounter medications on file as of 07/12/2021.    Lab Results  Component Value Date/Time   HGBA1C 5.3 04/19/2020 09:33 AM   HGBA1C 5.6 02/17/2020 04:26 AM     BP Readings from Last 3 Encounters:  02/04/21 (!) 143/83  01/24/21 132/86  11/05/20 131/86    Patient contacted to review initial questions prior to visit with [name] [name]. Spoke with Daughter [name] [name]  with the patient and [name] [name] will be present with the patient for appointment- may have to call [phone]  Have you seen any other providers since your last visit with PCP? Yes General Surgery Hernia repair follow up   Any changes  in your medications or health? Yes   Atorvastatin was decreased while in hospital, Increased by Neurology, no recent labs done, family concerned about correct dose of Atorvastatin   Any side effects from any medications? No  Do you have an symptoms or problems not managed by your medications? No  Any concerns about your health right now? No  Do you get any type of exercise on a regular basis? No  Can you think of a goal you would like to reach for your health? No  Do you have any problems getting your medications? No  [facility] . They have concerns regarding Brilinta and price, patient has been on other blood thinners and did not have good outcomes, so Brilinta is very helpful, but expensive at this time. She had coupon for the last year that has expired.  Is there anything that you would like to discuss during the appointment? Yes   Spoke with patient and reminded them to have all medications, supplements and any blood glucose and blood pressure readings available for review with pharmacist, at their telephone visit on 07/17/21 at 1:30pm.  Spoke with daughter [name] [name] with the patient   Star Rating Drugs:  Medication:  Last Fill: Day Supply Atorvastatin 40mg  05/14/21 90  Care Gaps: Annual wellness visit in last year? Yes Most Recent BP reading:  143/83  61-P  02/04/21 Hemoglobin A1C 5.4 12/19/2020   [name] CPP notified  [name], [place] Assistant [phone]  Total time spent for month CPA: 40 min

## 2021-07-16 ENCOUNTER — Ambulatory Visit: Payer: Medicare HMO | Admitting: Behavioral Health

## 2021-07-17 ENCOUNTER — Ambulatory Visit (INDEPENDENT_AMBULATORY_CARE_PROVIDER_SITE_OTHER): Payer: Medicare HMO | Admitting: Pharmacist

## 2021-07-17 ENCOUNTER — Other Ambulatory Visit: Payer: Self-pay

## 2021-07-17 DIAGNOSIS — Z8673 Personal history of transient ischemic attack (TIA), and cerebral infarction without residual deficits: Secondary | ICD-10-CM

## 2021-07-17 DIAGNOSIS — I10 Essential (primary) hypertension: Secondary | ICD-10-CM

## 2021-07-17 DIAGNOSIS — I639 Cerebral infarction, unspecified: Secondary | ICD-10-CM

## 2021-07-17 DIAGNOSIS — E7800 Pure hypercholesterolemia, unspecified: Secondary | ICD-10-CM

## 2021-07-17 DIAGNOSIS — F411 Generalized anxiety disorder: Secondary | ICD-10-CM

## 2021-07-17 DIAGNOSIS — F32A Depression, unspecified: Secondary | ICD-10-CM

## 2021-07-17 MED ORDER — TICAGRELOR 90 MG PO TABS: 90.0000 mg | ORAL_TABLET | Freq: Two times a day (BID) | ORAL | 0 refills | Status: DC

## 2021-07-17 NOTE — Progress Notes (Signed)
Chronic Care Management Pharmacy Note  07/19/2021 Name:  [name] [name] MRN:  [id number] DOB:  [birth year]  Summary: CCM Initial visit -Patient and daughter [name] endorse compliance with medications - daughter sets up pill box each week -Pt reports Brilinta is hard to pay for, especially in donut hole. She should qualify for PAP based on reported income ~$35,000 annually -Pt has history of multiple strokes; LDL was 88 in 01/2021 and atorvastatin was increased to 40 mg at that time; lipids have not been rechecked since  Recommendations/Changes made from today's visit: -No med changes -En[place] for Brilinta. Rx sent to [facility] re-checking lipid panel - upcoming Neurology visit in March 2023  Plan: -[facility] will call patient 3 months for general adherence review -Pharmacist follow up televisit scheduled for 6 months -AWV (LPN) 01/26/22    Subjective: [name] [name] is an 81 y.o. year old female who is a primary patient of Damita Dunnings, Elveria Rising, MD.  The CCM team was consulted for assistance with disease management and care coordination needs.    Engaged with patient by telephone for initial visit in response to provider referral for pharmacy case management and/or care coordination services.   Consent to Services:  The patient was given the following information about Chronic Care Management services today, agreed to services, and gave verbal consent: 1. CCM service includes personalized support from designated clinical staff supervised by the primary care provider, including individualized plan of care and coordination with other care providers 2. 24/7 contact phone numbers for assistance for urgent and routine care needs. 3. Service will only be billed when office clinical staff spend 20 minutes or more in a month to coordinate care. 4. Only one practitioner may furnish and bill the service in a calendar month. 5.The patient may stop CCM services at any  time (effective at the end of the month) by phone call to the office staff. 6. The patient will be responsible for cost sharing (co-pay) of up to 20% of the service fee (after annual deductible is met). Patient agreed to services and consent obtained.  Patient Care Team: Tonia Ghent, MD as PCP - General (Family Medicine) Birder Robson, MD as Referring Physician (Ophthalmology) Marry Guan Laurice Record, MD as Consulting Physician (Orthopedic Surgery) Garvin Fila, MD as Consulting Physician (Neurology) Charlton Haws, [facility] as Pharmacist (Pharmacist)  Recent office visits: 04/01/21-Janet Marcelline Deist MSW,LCSW- CCM social work encounter 01/24/21-PCP-[name],MD-Patient presented for hospital follow up.Reasonable to continue her baseline medications, use MiraLAX as needed and add on vitamin D.  Recent consult visits: 06/06/21-Cardiology-remote pacemaker check(monthly check) 04/16/21-General Surgery-[name],MD-Patient presented for follow up hernia surgery.recommended that she begin to take MiraLAX -1/2 dose daily, adjust to effect. CT-scan ordered  04/16/21-Behavioral Health-NP Lesle Chris: f/u Paranoia, MDD; pt feeling well, denies paranoid thoughts. No med changes.  03/06/21-Home Health Services-no data found- weekly visits  02/04/21-Neurology-[name],NP- f/u cognitive impairment, anxiety, stroke. LDL 88, advised to resume atorvastatin 40 mg daily.  Hospital visits: None in previous 6 months   Objective:  Lab Results  Component Value Date   CREATININE 0.89 10/26/2020   BUN 15 10/26/2020   GFR 59.88 (L) 02/23/2020   GFRNONAA >60 10/26/2020   GFRAA >60 02/18/2020   NA 134 (L) 10/26/2020   K 4.3 10/26/2020   CALCIUM 10.0 10/26/2020   CO2 25 10/26/2020   GLUCOSE 109 (H) 10/26/2020    Lab Results  Component Value Date/Time   HGBA1C 5.3 04/19/2020 09:33 AM  HGBA1C 5.6 02/17/2020 04:26 AM   GFR 59.88 (L) 02/23/2020 01:30 PM   GFR 63.97 10/13/2019 08:12 AM     Last diabetic Eye exam: No results found for: HMDIABEYEEXA  Last diabetic Foot exam: No results found for: HMDIABFOOTEX   Lab Results  Component Value Date   CHOL 168 02/04/2021   HDL 51 02/04/2021   LDLCALC 88 02/04/2021   LDLDIRECT 160.0 12/10/2018   TRIG 170 (H) 02/04/2021   CHOLHDL 3.3 02/04/2021    Hepatic Function Latest Ref Rng & Units 10/26/2020 08/04/2020 05/08/2020  Total Protein 6.5 - 8.1 g/dL 7.1 6.9 6.5  Albumin 3.5 - 5.0 g/dL 4.3 4.2 3.8  AST 15 - 41 U/L 25 26 25   ALT 0 - 44 U/L 21 20 21   Alk Phosphatase 38 - 126 U/L 146(H) 121 127(H)  Total Bilirubin 0.3 - 1.2 mg/dL 1.1 1.0 0.9  Bilirubin, Direct 0.0 - 0.3 mg/dL - - -    Lab Results  Component Value Date/Time   TSH 1.400 11/05/2020 01:16 PM   TSH 1.235 04/19/2020 09:33 AM   TSH 1.480 05/31/2015 11:36 AM    CBC Latest Ref Rng & Units 10/26/2020 08/04/2020 05/08/2020  WBC 4.0 - 10.5 K/uL 9.6 6.9 7.5  Hemoglobin 12.0 - 15.0 g/dL 16.1(H) 14.6 14.8  Hematocrit 36.0 - 46.0 % 48.8(H) 44.6 45.8  Platelets 150 - 400 K/uL 305 278 273    No results found for: VD25OH  Clinical ASCVD: Yes  The ASCVD Risk score (Arnett DK, et al., 2019) failed to calculate for the following reasons:   The patient has a prior MI or stroke diagnosis    Depression screen [facility] 2/9 04/30/2021 01/24/2021 07/26/2019  Decreased Interest 0 3 0  Down, Depressed, Hopeless 0 3 1  PHQ - 2 Score 0 6 1  Altered sleeping - 3 -  Tired, decreased energy - 0 -  Change in appetite - 3 -  Feeling bad or failure about yourself  - 2 -  Trouble concentrating - 0 -  Moving slowly or fidgety/restless - 3 -  Suicidal thoughts - 3 -  PHQ-9 Score - 20 -  Difficult doing work/chores - Not difficult at all -  Some recent data might be hidden    GAD 7 : Generalized Anxiety Score 01/24/2021 09/12/2020  Nervous, Anxious, on Edge 3 3  Control/stop worrying 3 3  Worry too much - different things 3 3  Trouble relaxing 3 3  Restless - 3  Easily annoyed or irritable 0 1   Afraid - awful might happen - 3  Total GAD 7 Score - 19  Anxiety Difficulty Not difficult at all -    Social History   Tobacco Use  Smoking Status Never  Smokeless Tobacco Never   BP Readings from Last 3 Encounters:  02/04/21 (!) 143/83  01/24/21 132/86  11/05/20 131/86   Pulse Readings from Last 3 Encounters:  02/04/21 61  01/24/21 61  11/05/20 76   Wt Readings from Last 3 Encounters:  04/30/21 124 lb (56.2 kg)  02/04/21 124 lb (56.2 kg)  01/24/21 122 lb (55.3 kg)   BMI Readings from Last 3 Encounters:  04/30/21 25.04 kg/m  02/04/21 25.04 kg/m  01/24/21 24.64 kg/m    Assessment/Interventions: Review of patient past medical history, allergies, medications, health status, including review of consultants reports, laboratory and other test data, was performed as part of comprehensive evaluation and provision of chronic care management services.   SDOH:  (Social Determinants of  Health) assessments and interventions performed: Yes SDOH Interventions    Flowsheet Row Most Recent Value  SDOH Interventions   Financial Strain Interventions Other (Comment)  [[place] in [place] for Brilinta]      SDOH Screenings   Alcohol Screen: Low Risk    Last Alcohol Screening Score (AUDIT): 0  Depression (PHQ2-9): Low Risk    PHQ-2 Score: 0  Financial Resource Strain: Low Risk    Difficulty of Paying Living Expenses: Not very hard  Food Insecurity: No Food Insecurity   Worried About Charity fundraiser in the Last Year: Never true   Ran Out of Food in the Last Year: Never true  Housing: Low Risk    Last Housing Risk Score: 0  Physical Activity: Insufficiently Active   Days of Exercise per Week: 3 days   Minutes of Exercise per Session: 10 min  Social Connections: Socially Isolated   Frequency of Communication with Friends and Family: More than three times a week   Frequency of Social Gatherings with Friends and Family: More than three times a week   Attends Religious  Services: Never   Marine scientist or Organizations: No   Attends Archivist Meetings: Never   Marital Status: Widowed  Stress: No Stress Concern Present   Feeling of Stress : Not at all  Tobacco Use: Low Risk    Smoking Tobacco Use: Never   Smokeless Tobacco Use: Never   Passive Exposure: Not on file  Transportation Needs: No Transportation Needs   Lack of Transportation (Medical): No   Lack of Transportation (Non-Medical): No    CCM Care Plan  Allergies  Allergen Reactions   Hydrocodone Nausea Only    Noted after surgery, may be able to tolerate with food    Medications Reviewed Today     Reviewed by Charlton Haws, [facility] (Pharmacist) on 07/17/21 at 1418  Med List Status: <None>   Medication Order Taking? Sig Documenting Provider Last Dose Status Informant  atenolol (TENORMIN) 25 MG tablet [id number] Yes Take 1 tablet (25 mg total) by mouth 2 (two) times daily. Tonia Ghent, MD Taking Active   atorvastatin (LIPITOR) 40 MG tablet [id number] Yes Take 1 tablet (40 mg total) by mouth daily. Frann Rider, NP Taking Active   BRILINTA 90 MG TABS tablet [id number] Yes TAKE ONE TABLET TWICE DAILY Tonia Ghent, MD Taking Active   Cholecalciferol (VITAMIN D3) 25 MCG (1000 UT) CAPS [id number] Yes Take 1 capsule (1,000 Units total) by mouth daily. Tonia Ghent, MD Taking Active   donepezil (ARICEPT) 5 MG tablet [id number] Yes Take 1 tablet (5 mg total) by mouth daily. Tonia Ghent, MD Taking Active   melatonin 3 MG TABS tablet [id number] Yes Take by mouth. [provider] Taking Active   OLANZapine (ZYPREXA) 10 MG tablet [id number] Yes Take 1 tablet (10 mg total) by mouth at bedtime. Elwanda Brooklyn, NP Taking Active   polyethylene glycol powder (GLYCOLAX/MIRALAX) 17 GM/SCOOP powder [id number] Yes Take 17 g by mouth daily as needed for mild constipation. Tonia Ghent, MD Taking Active Multiple Informants  sertraline (ZOLOFT) 100 MG tablet [id number]  Yes Take 1.5 tablets (150 mg total) by mouth daily. Elwanda Brooklyn, NP Taking Active   thiamine 100 MG tablet [id number] Yes Take 1 tablet (100 mg total) by mouth daily. Tonia Ghent, MD Taking Active             Patient Active Problem List   Diagnosis Date  Noted   Major neurocognitive disorder ([place]) 01/14/2021   Cognitive decline 12/12/2020   GAD (generalized anxiety disorder) 12/12/2020   Adjustment disorder with mixed anxiety and depressed mood 10/27/2020   Suicidal ideations 10/27/2020   Shingles 08/30/2020   Acute CVA (cerebrovascular accident) ([place]) 04/18/2020   Seasonal and perennial allergic rhinitis 03/28/2020   HLD (hyperlipidemia) 02/16/2020   Stroke ([place]) 02/16/2020   Fall 02/16/2020   Abdominal wall hernia 10/30/2019   Change in voice 10/30/2019   History of stroke 09/14/2019   Spinal stenosis 07/24/2019   Mixed hyperlipidemia    OSA (obstructive sleep apnea)    Dysfunction of eustachian tube 05/15/2019   Other social stressor 03/29/2019   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   Diarrhea 06/20/2018   Joint pain 06/20/2018   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   Medicare annual wellness visit, subsequent 09/15/2017   SUI (stress urinary incontinence, female) 08/07/2017   Back pain 06/16/2016   Advance care planning 01/07/2016   Sleep apnea    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   Allergic rhinitis 11/24/2014   Adult BMI 30+ 11/24/2014   Depression 11/24/2014   Insomnia 08/23/2009   Headache, migraine 05/10/2009   HERPES SIMPLEX INFECTION 08/14/2006   HYPERCHOLESTEROLEMIA 08/14/2006   Anxiety state 08/14/2006   Essential hypertension 08/14/2006   HIATAL HERNIA 08/14/2006   IRRITABLE BOWEL SYNDROME 08/14/2006   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   Primary osteoarthritis of right knee 08/14/2006   PLANTAR FASCIITIS 08/14/2006    Immunization History  Administered Date(s) Administered   Influenza Inj  Mdck Quad Pf 03/16/2018   Influenza Whole 05/09/2004   Influenza, High Dose Seasonal PF 04/01/2017, 03/23/2019   Influenza, Seasonal, Injecte, Preservative Fre 03/09/2016   Influenza,inj,Quad PF,6+ Mos 02/06/2014   Influenza,inj,quad, With Preservative 03/31/2017   Influenza-Unspecified 01/24/2014, 03/16/2018, 03/23/2019   PFIZER(Purple Top)SARS-COV-2 Vaccination 06/06/2019, 06/25/2019   PPD Test 01/09/2021   Pneumococcal Conjugate-13 04/25/2014   Pneumococcal Polysaccharide-23 03/30/2007   Pneumococcal-Unspecified 09/17/2007   Td 10/24/2005   Tdap 01/18/2016   Zoster, Live 05/26/2008    Conditions to be addressed/monitored:  Hypertension, Hyperlipidemia, Depression, and Anxiety  Care Plan : [place]  Updates made by Charlton Haws, [place] since 07/19/2021 12:00 AM     Problem: Hypertension, Hyperlipidemia, Depression, and Anxiety   Priority: High     Long-Range Goal: Disease mgmt   Start Date: 07/19/2021  Expected End Date: 07/19/2022  This Visit's Progress: On track  Priority: High  Note:   Current Barriers:  Unable to independently afford treatment regimen Unable to independently monitor therapeutic efficacy  Pharmacist Clinical Goal(s):  Patient will verbalize ability to afford treatment regimen achieve adherence to monitoring guidelines and medication adherence to achieve therapeutic efficacy through collaboration with PharmD and provider.   Interventions: 1:1 collaboration with Tonia Ghent, MD regarding development and update of comprehensive plan of care as evidenced by provider attestation and co-signature Inter-disciplinary care team collaboration (see longitudinal plan of care) Comprehensive medication review performed; medication list updated in electronic medical record  Hypertension (BP goal <130/80) -Controlled - BP is at/near goal in office; pt is not checking at home; she does endorse occasional dizziness, she uses walker or cane to  get around at all times -Hx strokes; OSA -Current home BP readings: not checking -Current treatment: Atenolol 25 mg BID - Query Appropriate, Effective, Safe, Accessible -Medications previously tried: metoprolol  -Educated on BP goals and benefits of medications for prevention of heart attack,  stroke and kidney damage; Importance of home blood pressure monitoring; Symptoms of hypotension and importance of maintaining adequate hydration; -Counseled to monitor BP at home twice a week -Recommended to continue current medication  Hyperlipidemia: (LDL goal < 70) -Not ideally controlled - LDL was 88 01/2021 and atorvastatin was increased to 40 mg; lipids have not been rechecked since; pt endorses compliance with statin as prescribed; she reports no strokes since being Brilinta, she did not do well with other antiplatelets, but cost is an issue -Hx strokes (multiple strokes in 2021); hx SAH after fall summer 2022 (held Brilinta x 2 weeks, then restarted) -Current treatment: Atorvastatin 40 mg daily - Appropriate, Query Effective, Safe, Accessible Brilinta 90 mg BID - Appropriate, Effective, Safe, Accessible -Medications previously tried: simvastatin, Plavix -Educated on Cholesterol goals; Benefits of statin for ASCVD risk reduction; -Assessed patient finances. She qualifies for Brilinta PAP. 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; 24/7 care advised, ultimately discharged home w/ family -PHQ9: 0 (04/2021) - minimal depression -GAD7: 12 (01/2021) - moderate anxiety -MMSE 25/30 (01/2021) - mild cognitive impairment -Connected with Neurology and [facility] for mental health support -Current  treatment: Sertraline 100 mg 1.5 tab daily - Appropriate, Effective, Safe, Accessible Olanzapine 10 mg daily HS - Appropriate, Effective, Safe, Accessible Donepezil 5 mg daily HS - Appropriate, Effective, Safe, Accessible -Medications previously tried/failed: seroquel, mirtazapine, hydroxyzine -Educated on Benefits of medication for symptom control -Discussed benefits of donepezil - slow progression of cognitive decline, not necessarily improve cognition -Recommended to continue current medication  IBS (Goal: manage symptoms) -Controlled -pt reports diarrhea vs constipation - cycling; started lactaid with ice cream and sx improved -Current treatment  Miralax PRN -Medications previously tried: n/a -Recommended to continue current medication  Health Maintenance -Vaccine gaps: Flu, Pneumovax, Shingrix, covid booster -Current therapy:  Vitamin D 1000 IU Melatonin 3 mg Miralax Thiamine 100 mg -Patient is satisfied with current therapy and denies issues -Recommended to continue current medication  Patient Goals/Self-Care Activities Patient will:  - take medications as prescribed as evidenced by patient report and record review focus on medication adherence by pill box collaborate with provider on medication access solutions (Brilinta)      Medication Assistance:  Brilinta - [place] approved through 05/25/22  Compliance/Adherence/Medication fill history: Care Gaps: Mammogram (due 01/11/21) Hep C screening  Star-Rating Drugs: Atorvastatin - PDC 92%  Patient's preferred pharmacy is:  [facility], [place] - [address] [place] [address] Phone: [phone] Fax: [phone]  [facility] - [place], [place] [address] [place] [address] Phone: [phone] Fax: [phone]  Uses pill box? Yes - daughter sets up pill box; pt texts her daughter each time she takes her meds Pt endorses 100% compliance  We  discussed: Current pharmacy is preferred with insurance plan and patient is satisfied with pharmacy services Patient decided to: Continue current medication management strategy  Care Plan and Follow Up Patient Decision:  Patient agrees to Care Plan and Follow-up.  Plan: Telephone follow up appointment with care management team member scheduled for:  6 months  [name], PharmD, BCACP Clinical Pharmacist [facility] [phone]

## 2021-07-19 NOTE — Patient Instructions (Addendum)
Visit Information  Phone number for Pharmacist: [phone]  Thank you for meeting with me to discuss your medications! I look forward to working with you to achieve your health care goals. Below is a summary of what we talked about during the visit:   Goals Addressed             This Visit's Progress    Manage My Medicine       Timeframe:  Long-Range Goal Priority:  Medium Start Date:    07/17/21                         Expected End Date:     07/17/22                  Follow Up Date Aug 2023   - call for medicine refill 2 or 3 days before it runs out - call if I am sick and can't take my medicine - keep a list of all the medicines I take; vitamins and herbals too - use a pillbox to sort medicine   -collaborate with provider on medication access solutions (Brilinta)  Why is this important?   These steps will help you keep on track with your medicines.   Notes:         Care Plan : [place]  Updates made by Charlton Haws, RPH since 07/19/2021 12:00 AM     Problem: Hypertension, Hyperlipidemia, Depression, and Anxiety   Priority: High     Long-Range Goal: Disease mgmt   Start Date: 07/19/2021  Expected End Date: 07/19/2022  This Visit's Progress: On track  Priority: High  Note:   Current Barriers:  Unable to independently afford treatment regimen Unable to independently monitor therapeutic efficacy  Pharmacist Clinical Goal(s):  Patient will verbalize ability to afford treatment regimen achieve adherence to monitoring guidelines and medication adherence to achieve therapeutic efficacy through collaboration with PharmD and provider.   Interventions: 1:1 collaboration with Tonia Ghent, MD regarding development and update of comprehensive plan of care as evidenced by provider attestation and co-signature Inter-disciplinary care team collaboration (see longitudinal plan of care) Comprehensive medication review performed; medication list  updated in electronic medical record  Hypertension (BP goal <130/80) -Controlled - BP is at/near goal in office; pt is not checking at home; she does endorse occasional dizziness, she uses walker or cane to get around at all times -Hx strokes; OSA -Current home BP readings: not checking -Current treatment: Atenolol 25 mg BID - Query Appropriate, Effective, Safe, Accessible -Medications previously tried: metoprolol  -Educated on BP goals and benefits of medications for prevention of heart attack, stroke and kidney damage; Importance of home blood pressure monitoring; Symptoms of hypotension and importance of maintaining adequate hydration; -Counseled to monitor BP at home twice a week -Recommended to continue current medication  Hyperlipidemia: (LDL goal < 70) -Not ideally controlled - LDL was 88 01/2021 and atorvastatin was increased to 40 mg; lipids have not been rechecked since; pt endorses compliance with statin as prescribed; she reports no strokes since being Brilinta, she did not do well with other antiplatelets, but cost is an issue -Hx strokes (multiple strokes in 2021); hx SAH after fall summer 2022 (held Brilinta x 2 weeks, then restarted) -Current treatment: Atorvastatin 40 mg daily - Appropriate, Query Effective, Safe, Accessible Brilinta 90 mg BID - Appropriate, Effective, Safe, Accessible -Medications previously tried: simvastatin, Plavix -Educated on Cholesterol goals; Benefits of statin for ASCVD  risk reduction; -Assessed patient finances. She qualifies for Brilinta PAP. 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; 24/7 care advised, ultimately discharged home w/ family -PHQ9: 0  (04/2021) - minimal depression -GAD7: 12 (01/2021) - moderate anxiety -MMSE 25/30 (01/2021) - mild cognitive impairment -Connected with Neurology and [facility] for mental health support -Current treatment: Sertraline 100 mg 1.5 tab daily - Appropriate, Effective, Safe, Accessible Olanzapine 10 mg daily HS - Appropriate, Effective, Safe, Accessible Donepezil 5 mg daily HS - Appropriate, Effective, Safe, Accessible -Medications previously tried/failed: seroquel, mirtazapine, hydroxyzine -Educated on Benefits of medication for symptom control -Discussed benefits of donepezil - slow progression of cognitive decline, not necessarily improve cognition -Recommended to continue current medication  IBS (Goal: manage symptoms) -Controlled -pt reports diarrhea vs constipation - cycling; started lactaid with ice cream and sx improved -Current treatment  Miralax PRN -Medications previously tried: n/a -Recommended to continue current medication  Health Maintenance -Vaccine gaps: Flu, Pneumovax, Shingrix, covid booster -Current therapy:  Vitamin D 1000 IU Melatonin 3 mg Miralax Thiamine 100 mg -Patient is satisfied with current therapy and denies issues -Recommended to continue current medication  Patient Goals/Self-Care Activities Patient will:  - take medications as prescribed as evidenced by patient report and record review focus on medication adherence by pill box collaborate with provider on medication access solutions (Brilinta)      Ms. Bodner was given information about Chronic Care Management services today including:  CCM service includes personalized support from designated clinical staff supervised by her physician, including individualized plan of care and coordination with other care providers 24/7 contact phone numbers for assistance for urgent and routine care needs. Standard insurance, coinsurance, copays and deductibles apply for chronic care management only during  months in which we provide at least 20 minutes of these services. Most insurances cover these services at 100%, however patients may be responsible for any copay, coinsurance and/or deductible if applicable. This service may help you avoid the need for more expensive face-to-face services. Only one practitioner may furnish and bill the service in a calendar month. The patient may stop CCM services at any time (effective at the end of the month) by phone call to the office staff.  Patient agreed to services and verbal consent obtained.   Patient verbalizes understanding of instructions and care plan provided today and agrees to view in [place]. Active MyChart status confirmed with patient.   Telephone follow up appointment with pharmacy team member scheduled for: 6 months  [name], PharmD, [facility] Clinical Pharmacist [facility] [phone]

## 2021-07-23 DIAGNOSIS — F32A Depression, unspecified: Secondary | ICD-10-CM | POA: Diagnosis not present

## 2021-07-23 DIAGNOSIS — E7800 Pure hypercholesterolemia, unspecified: Secondary | ICD-10-CM | POA: Diagnosis not present

## 2021-07-23 DIAGNOSIS — I10 Essential (primary) hypertension: Secondary | ICD-10-CM | POA: Diagnosis not present

## 2021-07-29 ENCOUNTER — Telehealth: Payer: Medicare HMO | Admitting: Behavioral Health

## 2021-07-29 ENCOUNTER — Encounter: Payer: Self-pay | Admitting: Behavioral Health

## 2021-07-29 DIAGNOSIS — F331 Major depressive disorder, recurrent, moderate: Secondary | ICD-10-CM | POA: Diagnosis not present

## 2021-07-29 DIAGNOSIS — R4189 Other symptoms and signs involving cognitive functions and awareness: Secondary | ICD-10-CM | POA: Diagnosis not present

## 2021-07-29 DIAGNOSIS — R630 Anorexia: Secondary | ICD-10-CM | POA: Diagnosis not present

## 2021-07-29 DIAGNOSIS — F411 Generalized anxiety disorder: Secondary | ICD-10-CM

## 2021-07-29 MED ORDER — OLANZAPINE 10 MG PO TABS: 10.0000 mg | ORAL_TABLET | Freq: Every day | ORAL | 3 refills | Status: DC

## 2021-07-29 MED ORDER — SERTRALINE HCL 100 MG PO TABS: 150.0000 mg | ORAL_TABLET | Freq: Every day | ORAL | 3 refills | Status: DC

## 2021-07-29 NOTE — Progress Notes (Signed)
[name] [name] [id number] [birth year] 81 y.o.  Virtual Visit via Video Note  I connected with pt @ on 07/29/21 at 10:00 AM EST by a video enabled telemedicine application and verified that I am speaking with the correct person using two identifiers.   I discussed the limitations of evaluation and management by telemedicine and the availability of in person appointments. The patient expressed understanding and agreed to proceed.  I discussed the assessment and treatment plan with the patient. The patient was provided an opportunity to ask questions and all were answered. The patient agreed with the plan and demonstrated an understanding of the instructions.   The patient was advised to call back or seek an in-person evaluation if the symptoms worsen or if the condition fails to improve as anticipated.  I provided 20 minutes of non-face-to-face time during this encounter.  The patient was located at home.  The provider was located at [place].   Elwanda [place], NP   Subjective:   Patient ID:  [name] [name] is a 81 y.o. (DOB Jan 24, 1941) female.  Chief Complaint:  Chief Complaint  Patient presents with   Anxiety   Depression   Cognitive Decline   Follow-up   Medication Refill    HPI  [name] [name] presents for follow-up and medication management. She is alert and cognitively sound today. She appears to be a little more sluggish this visit or fatigued. Her daughter is present with her consent. She says that she feels some low level depression but mostly due to changes like not being able to drive anymore.  No paranoid thoughts anymore. Has been living independent and able to conduct all ADL's. Says that her anxiety and depression have improved significantly to manageable levels. She says that she would like to leave her medication alone for now and just needs refills. Anxiety level today is 2/10 and depression is 2/10. She is sleeping with aid of medications 7-8 hours per  night. No mania or psychosis present. No auditory or visual hallucinations. No paranoia or delusional thinking. No SI/HI.   Review of Systems:  Review of Systems  Medications: I have reviewed the patient's current medications.  Current Outpatient Medications  Medication Sig Dispense Refill   atenolol (TENORMIN) 25 MG tablet Take 1 tablet (25 mg total) by mouth 2 (two) times daily. 180 tablet 1   atorvastatin (LIPITOR) 40 MG tablet Take 1 tablet (40 mg total) by mouth daily. 90 tablet 3   Cholecalciferol (VITAMIN D3) 25 MCG (1000 UT) CAPS Take 1 capsule (1,000 Units total) by mouth daily.     donepezil (ARICEPT) 5 MG tablet Take 1 tablet (5 mg total) by mouth daily. 90 tablet 1   melatonin 3 MG TABS tablet Take by mouth.     OLANZapine (ZYPREXA) 10 MG tablet Take 1 tablet (10 mg total) by mouth at bedtime. 30 tablet 3   polyethylene glycol powder (GLYCOLAX/MIRALAX) 17 GM/SCOOP powder Take 17 g by mouth daily as needed for mild constipation.     sertraline (ZOLOFT) 100 MG tablet Take 1.5 tablets (150 mg total) by mouth daily. 45 tablet 3   thiamine 100 MG tablet Take 1 tablet (100 mg total) by mouth daily. 90 tablet 1   ticagrelor (BRILINTA) 90 MG TABS tablet Take 1 tablet (90 mg total) by mouth 2 (two) times daily. Via [place] pt assistance 180 tablet 0   No current facility-administered medications for this visit.    Medication Side Effects: None  Allergies:  Allergies  Allergen Reactions   Hydrocodone Nausea Only    Noted after surgery, may be able to tolerate with food    Past Medical History:  Diagnosis Date   Arthritis    knees   Depression    Fatty liver    GERD (gastroesophageal reflux disease)    Hyperlipidemia    Hypertension    IBS (irritable bowel syndrome)    Obesity    Pneumonia    Sleep apnea    uses CPAP   Stroke (cerebrum) ([place])     Family History  Problem Relation Age of Onset   Heart attack Mother    Dementia Father    Heart attack Father 71    Aortic aneurysm Brother    Stroke Paternal Grandfather    Colon cancer Neg Hx    Breast cancer Neg Hx     Social History   Socioeconomic History   Marital status: Widowed    Spouse name: Not on file   Number of children: Not on file   Years of education: Not on file   Highest education level: Not on file  Occupational History   Occupation: retired  Tobacco Use   Smoking status: Never   Smokeless tobacco: Never  Vaping Use   Vaping Use: Never used  Substance and Sexual Activity   Alcohol use: Not Currently    Comment: ocassional glass of wine 2-3 times a year   Drug use: No   Sexual activity: Never  Other Topics Concern   Not on file  Social History Narrative   Lives alone in home   Right Handed   Drinks 1-2 cups caffeine daily   Social Determinants of Health   Financial Resource Strain: Low Risk    Difficulty of Paying Living Expenses: Not very hard  Food Insecurity: No Food Insecurity   Worried About Charity fundraiser in the Last Year: Never true   [place] in the Last Year: Never true  Transportation Needs: No Transportation Needs   Lack of Transportation (Medical): No   Lack of Transportation (Non-Medical): No  Physical Activity: Insufficiently Active   Days of Exercise per Week: 3 days   Minutes of Exercise per Session: 10 min  Stress: No Stress Concern Present   Feeling of Stress : Not at all  Social Connections: Socially Isolated   Frequency of Communication with Friends and Family: More than three times a week   Frequency of Social Gatherings with Friends and Family: More than three times a week   Attends Religious Services: Never   Marine scientist or Organizations: No   Attends Archivist Meetings: Never   Marital Status: Widowed  Human resources officer Violence: Not At Risk   Fear of Current or Ex-Partner: No   Emotionally Abused: No   Physically Abused: No   Sexually Abused: No    Past Medical History, Surgical history,  Social history, and Family history were reviewed and updated as appropriate.   Please see review of systems for further details on the patient's review from today.   Objective:   Physical Exam:  There were no vitals taken for this visit.  Physical Exam  Lab Review:     Component Value Date/Time   NA 134 (L) 10/26/2020 2152   NA 141 05/31/2015 1136   NA 143 11/26/2012 1630   K 4.3 10/26/2020 2152   K 4.3 11/26/2012 1630   CL 98 10/26/2020 2152   CL 110 (H)  11/26/2012 1630   CO2 25 10/26/2020 2152   CO2 27 11/26/2012 1630   GLUCOSE 109 (H) 10/26/2020 2152   GLUCOSE 97 11/26/2012 1630   BUN 15 10/26/2020 2152   BUN 15 05/31/2015 1136   BUN 14 11/26/2012 1630   CREATININE 0.89 10/26/2020 2152   CREATININE 0.88 11/26/2012 1630   CALCIUM 10.0 10/26/2020 2152   CALCIUM 9.1 11/26/2012 1630   PROT 7.1 10/26/2020 2152   PROT 6.5 05/31/2015 1136   PROT 6.7 11/26/2012 1630   ALBUMIN 4.3 10/26/2020 2152   ALBUMIN 4.2 05/31/2015 1136   ALBUMIN 3.7 11/26/2012 1630   AST 25 10/26/2020 2152   AST 33 11/26/2012 1630   ALT 21 10/26/2020 2152   ALT 27 11/26/2012 1630   ALKPHOS 146 (H) 10/26/2020 2152   ALKPHOS 96 11/26/2012 1630   BILITOT 1.1 10/26/2020 2152   BILITOT 0.4 05/31/2015 1136   BILITOT 0.3 11/26/2012 1630   GFRNONAA >60 10/26/2020 2152   GFRNONAA >60 11/26/2012 1630   GFRAA >60 02/18/2020 0723   GFRAA >60 11/26/2012 1630       Component Value Date/Time   WBC 9.6 10/26/2020 2151   RBC 5.11 10/26/2020 2151   HGB 16.1 (H) 10/26/2020 2151   HGB 15.7 05/31/2015 1136   HCT 48.8 (H) 10/26/2020 2151   HCT 45.0 05/31/2015 1136   PLT 305 10/26/2020 2151   PLT 280 05/31/2015 1136   MCV 95.5 10/26/2020 2151   MCV 89 05/31/2015 1136   MCV 91 11/26/2012 1630   MCH 31.5 10/26/2020 2151   MCHC 33.0 10/26/2020 2151   RDW 14.0 10/26/2020 2151   RDW 12.6 05/31/2015 1136   RDW 12.8 11/26/2012 1630   LYMPHSABS 1.9 10/26/2020 2151   LYMPHSABS 2.0 05/31/2015 1136   MONOABS  0.8 10/26/2020 2151   EOSABS 0.2 10/26/2020 2151   EOSABS 0.2 05/31/2015 1136   BASOSABS 0.1 10/26/2020 2151   BASOSABS 0.1 05/31/2015 1136    No results found for: POCLITH, LITHIUM   No results found for: PHENYTOIN, PHENOBARB, VALPROATE, CBMZ   .res Assessment: Plan:    [name] [name] was seen today for anxiety, depression, cognitive decline, follow-up and medication refill.  Diagnoses and all orders for this visit:  Generalized anxiety disorder -     Discontinue: OLANZapine (ZYPREXA) 10 MG tablet; Take 1 tablet (10 mg total) by mouth at bedtime. -     sertraline (ZOLOFT) 100 MG tablet; Take 1.5 tablets (150 mg total) by mouth daily. -     OLANZapine (ZYPREXA) 10 MG tablet; Take 1 tablet (10 mg total) by mouth at bedtime.  Cognitive decline -     Discontinue: OLANZapine (ZYPREXA) 10 MG tablet; Take 1 tablet (10 mg total) by mouth at bedtime. -     sertraline (ZOLOFT) 100 MG tablet; Take 1.5 tablets (150 mg total) by mouth daily. -     OLANZapine (ZYPREXA) 10 MG tablet; Take 1 tablet (10 mg total) by mouth at bedtime.  Major depressive disorder, recurrent episode, moderate (HCC) -     Discontinue: OLANZapine (ZYPREXA) 10 MG tablet; Take 1 tablet (10 mg total) by mouth at bedtime. -     sertraline (ZOLOFT) 100 MG tablet; Take 1.5 tablets (150 mg total) by mouth daily. -     OLANZapine (ZYPREXA) 10 MG tablet; Take 1 tablet (10 mg total) by mouth at bedtime.  Lack of appetite -     Discontinue: OLANZapine (ZYPREXA) 10 MG tablet; Take 1 tablet (10 mg total) by mouth  at bedtime. -     sertraline (ZOLOFT) 100 MG tablet; Take 1.5 tablets (150 mg total) by mouth daily. -     OLANZapine (ZYPREXA) 10 MG tablet; Take 1 tablet (10 mg total) by mouth at bedtime.    To continue  Zyprexa 10 mg daily To continue Zoloft  150 mg daily. Will report worsening symptoms, acute further decline in cognition or behaviors. Provided after hours emergency contact information Discussed potential metabolic side  effects associated with atypical antipsychotics, as well as potential risk for movement side effects. Advised pt to contact office if movement side effects occur.  Greater than 50% of 20  min. video visit time with patient was spent on counseling and coordination of care. We discussed her recent improvement with anxiety and depression. Paranoia and delusional thinking has been absent for several months now.  Daughter indicated that no medication changes desired at this time. Dicussed the possibility of lowering her Abilify to 5 mg next visit if she is still stable. Discussed potential metabolic side effects associated with atypical antipsychotics, as well as potential risk for movement side effects. Advised pt to contact office if movement side effects occur.            Please see After Visit Summary for patient specific instructions.  Future Appointments  Date Time Provider [place]  08/06/2021 12:45 PM Frann Rider, NP [facility] None  08/08/2021  7:20 AM [facility] DEVICE REMOTES [facility]  09/09/2021  7:20 AM [facility] DEVICE REMOTES [facility]  10/29/2021 11:00 AM Zaeem Kandel, Louanna Raw, NP [facility] None  01/15/2022  3:00 PM [facility]CM PHARMACIST [facility]  05/01/2022  9:45 AM [facility] NURSE HEALTH ADVISOR [facility]    No orders of the defined types were placed in this encounter.     -------------------------------

## 2021-08-06 ENCOUNTER — Telehealth: Payer: Self-pay | Admitting: Adult Health

## 2021-08-06 ENCOUNTER — Encounter: Payer: Self-pay | Admitting: Adult Health

## 2021-08-06 ENCOUNTER — Ambulatory Visit: Payer: Medicare HMO | Admitting: Adult Health

## 2021-08-06 VITALS — BP 136/74 | HR 59 | Ht 59.0 in | Wt 122.0 lb

## 2021-08-06 DIAGNOSIS — Z8673 Personal history of transient ischemic attack (TIA), and cerebral infarction without residual deficits: Secondary | ICD-10-CM

## 2021-08-06 DIAGNOSIS — R269 Unspecified abnormalities of gait and mobility: Secondary | ICD-10-CM | POA: Diagnosis not present

## 2021-08-06 DIAGNOSIS — G4733 Obstructive sleep apnea (adult) (pediatric): Secondary | ICD-10-CM | POA: Diagnosis not present

## 2021-08-06 DIAGNOSIS — I69398 Other sequelae of cerebral infarction: Secondary | ICD-10-CM

## 2021-08-06 DIAGNOSIS — E785 Hyperlipidemia, unspecified: Secondary | ICD-10-CM

## 2021-08-06 DIAGNOSIS — G3184 Mild cognitive impairment, so stated: Secondary | ICD-10-CM | POA: Diagnosis not present

## 2021-08-06 NOTE — Patient Instructions (Addendum)
Your Plan: ? ?Continue current treatment plan  ? ?We will plan on doing home health physical therapy  - continue using your Rolator walker at all times unless otherwise instructed  ? ?Increase use of CPAP to nightly use ? ?We will check your cholesterol levels today - we will have results by tomorrow and will let you know via MyChart if any changes are needed  ? ? ? ? ?Follow up in 6 months or call earlier if needed  ? ? ? ? ?Thank you for coming to see [place] at [facility]. I hope we have been able to provide you high quality care today. ? ?You may receive a patient satisfaction survey over the next few weeks. We would appreciate your feedback and comments so that we may continue to improve ourselves and the health of our patients. ? ?

## 2021-08-06 NOTE — Telephone Encounter (Signed)
Sent message to Marjory Lies at [facility]  to see if he will be able to take the patient.  ?

## 2021-08-06 NOTE — Telephone Encounter (Signed)
Marjory Lies messaged me and informed me he is able to take the patient.  ?

## 2021-08-06 NOTE — Progress Notes (Signed)
?[facility] ?[address] ?. [place] [address] ?[phone] ? ?     STROKE FOLLOW UP NOTE ? ?[name] ?Date of Birth:  10-29-1940 ?Medical Record Number:  [id number]  ? ?Reason for Referral: stroke follow up ? ? ? ?SUBJECTIVE: ? ? ?CHIEF COMPLAINT:  ?Chief Complaint  ?Patient presents with  ? Follow-up  ?  RM 3 with daughter [name] ?Pt is well, daughter states she is more weak and has more imbalance.   ? ? ? ?HPI:  ? ?Update 08/06/2021 JM: 81 year old female with history of multiple recurrent strokes and MCI.  Returns today for 26-monthfollow-up visit accompanied by her daughter, [name] [name]  Feels more shaky/unsteady while walking which has been a gradual decline since prior visit.  Besides doing things simple chores around her home, there is no routine activity or exercise.  She continues to use a roller walker, did have fall 2 weeks ago after waking up in recliner around 1am and while trying to walk to her bed, she fell on her bottom, did not hit head, no injury. Denies any other falls. Poor appetite but daughter ensures she eats appropriately. Sleeping well more recently. Occasional use of CPAP, at times can have difficulty putting mask on.  Denies new stroke/TIA symptoms.  Remains on Brilinta and atorvastatin without side effects.  Blood pressure today 136/74.  Loop recorder has not shown atrial fibrillation thus far.  Cognition stable with good days/bad days.  Remains on Aricept, denies side effects.  Depression and anxiety currently on Zyprexa and Zoloft, closely monitored and managed by behavioral health with reported improvement since prior visit per [place] per patient, no significant improvement - does get sad regarding loss of independence since her strokes.   Living alone and able to maintain ADLs independently.  Does have supportive family who assists with majority of IADLs.  Does do simple cleaning and cooking.  No further concerns at this  time. ? ? ? ? ?History provided for reference purposes only ?Update 02/04/2021 JM: Ms. FCyranreturns for 346-monthollow-up accompanied by her daughter, [name] [name]? 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  Started on Zyprexa and donepezil and titrated home dose Zoloft.  She refused MRI during admission for further clarification of possible vascular dementia.  [facility] also [facility] per neuro[facility] recommendations.  Also treated for UTI.  [place] 23/30.  Recommended 24/7 care or placement in assisted living.  She was ultimately discharged home with family. ? ?Since discharge, she has been doing well.  Daughter reports improvement of paranoia and psychosis on current regimen.  Family was initially providing 24/7 supervision but as she was doing well continue ADLs majority of IADLs independently, family started to leave her by herself a couples hrs a day since last week. She did have a fall yesterday with right leg giving out but thankfully without injury.  [facility] and gait as well as lower back exercises and use of Rollator walker at all times.  Daughter still assists with medication and bill paying.  Cognition overall stable.  MMSE today 25/30.  Remains on Aricept.  Also remains on Brilinta and atorvastatin.  Blood pressure today 143/83.  No further concerns at this time ? ? ?Update 11/05/2020 Dr. SeLeonie Man  She returns for follow-up after last visit with [facility] practitioner 6 weeks ago.  She is accompanied by her daughter.  Their main complaint today is her memory loss which she has had since stroke.  Her short-term memory is poor.  She is also continues to have problems with anxiety and paranoia.  She is currently being managed by [facility]  psychiatry.  She initially tried Lexapro and Zoloft without benefit.  She is currently taking Seroquel at night but mostly to help her sleep.  She still having some mild anxiety and paranoid ideation.  She continues to have mild weakness in the right leg as well as left hand digits getting better.  She is tolerating Brilinta well without any bruising.  Blood pressures well controlled today it is 131/86.  She is tolerating Lipitor well without muscle aches and pains.  Patient is having short-term memory difficulties ever since her stroke which is not getting better.  Her daughter started helping her do her bills and finances.  She still lives alone and is independent.  She has not had any work-up for reversible causes of memory loss. ? ?Update 09/12/2020, [name] Rider, NP ) [name] [name] returns for acute visit regarding new onset symptoms accompanied by her daughter ? ?Approx 1 month ago, she suffered a fall after her right leg gave out.  She was fully evaluated in ED who felt likely due to chronic knee pain but per family request, CT head and MRI brain completed which was unremarkable for acute stroke.  Since that time, daughter has noticed worsening memory with increased times of confusion, forgetfulness and unsteadiness. Per daughter, symptoms onset around the time aspirin was discontinued.  Patient has chronic history of low back pain previously seen by [name] in 10/2019 but no recent follow-up as this was relatively stable but over the past month, pain has been more consistent and more frequent episodes of bilateral hip weakness sensation and right leg weakness typically occurring after prolonged ambulation.  She does have scheduled visit with [name] on 4/28 to undergo nerve block injections.  ? ?She does continue to live independently but her daughter checks on her routinely and assists with IADLs.  She has been greatly struggling with anxiety over the past month.  She does have history of anxiety but  recently greatly worsened.  PCP recently started Lexapro and recommended increasing to 15 mg daily but she is very hesitant to increase as she read online Lexapro can worsen anxiety.  She is also been struggling with insomnia as her mind will constantly wander with hydroxyzine prescribed by PCP but she denies much benefit.  She speaks of difficulty with her relationships as she has been limiting social interactions due to her low back pain and fear of her right leg giving out - she also feels like she is constantly being judged by her friends or worried about what they are thinking of her.  She is concerned if she misses a social event due to her low back pain and what sounds like social anxiety.  She is also fearful of undergoing nerve block injections as she is on Brilinta and feels like this should be discontinued prior to injection but was told by [name] nurse assistant that that was not necessary. ? ?Of note, seen by PCP Dr. Damita Dunnings 4/5 regarding these concerns who felt multifactorial and likely not new acute strokes.  She was also treated for shingles with Valtrex. ? ?She has remained on Brilinta and atorvastatin  without associated side effects ?Blood pressure 142/86 -occasionally monitored at home and typically stable ?She does endorse nightly compliance with CPAP for OSA management ?Loop recorder has not shown atrial fibrillation thus far ? ?No further concerns at this time ? ? ?Update 07/04/2020 JM: Ms. Damas returns for scheduled follow-up regarding prior strokes accompanied by her daughter. She has been stable since prior visit without any additional stroke/TIA symptoms. Reports residual left hand weakness and gait impairment. She will use RW at times especially upon awakening but otherwise doesn't need AD. She denies any recent falls.  She has remained on Brilinta, aspirin and atorvastatin 80 mg daily without side effects.  74-monthaspirin and Brilinta duration will be completed at the end of this  month.  Blood pressure today 121/74.  Monitors at home and typically stable.  Reports ongoing compliance with CPAP followed by Dr. ARexene Alberts Due to concern of multiple strokes etiology cryptogenic, loop recorder placed by Dr. ARayann Heman

## 2021-08-07 LAB — LIPID PANEL
Chol/HDL Ratio: 2.8 ratio (ref 0.0–4.4)
Cholesterol, Total: 139 mg/dL (ref 100–199)
HDL: 49 mg/dL (ref >39)
LDL Chol Calc (NIH): 66 mg/dL (ref 0–99)
Triglycerides: 135 mg/dL (ref 0–149)
VLDL Cholesterol Cal: 24 mg/dL (ref 5–40)

## 2021-08-08 ENCOUNTER — Ambulatory Visit (INDEPENDENT_AMBULATORY_CARE_PROVIDER_SITE_OTHER): Payer: Medicare HMO

## 2021-08-08 DIAGNOSIS — I639 Cerebral infarction, unspecified: Secondary | ICD-10-CM

## 2021-08-08 LAB — CUP PACEART REMOTE DEVICE CHECK
Date Time Interrogation Session: 20230315230634
Implantable Pulse Generator Implant Date: 20220126

## 2021-08-12 ENCOUNTER — Telehealth: Payer: Self-pay | Admitting: Adult Health

## 2021-08-12 NOTE — Telephone Encounter (Signed)
I called provided the verbal order for physical therapy as requested. ?

## 2021-08-12 NOTE — Telephone Encounter (Signed)
[facility] Araceli Bouche) requesting verbal order for PT. ?Frequency:  ?1x wk for 9 wks. ? ?[phone] ?

## 2021-08-16 NOTE — Progress Notes (Signed)
Carelink Summary Report / Loop Recorder 

## 2021-08-19 ENCOUNTER — Telehealth: Payer: Self-pay | Admitting: Adult Health

## 2021-08-19 ENCOUNTER — Other Ambulatory Visit: Payer: Self-pay | Admitting: Family Medicine

## 2021-08-19 NOTE — Telephone Encounter (Signed)
[name] [name], reviewed NP'S  last office note and answered d his questions to his stated satisfaction. He  verbalized understanding, appreciation. ? ?

## 2021-08-19 NOTE — Telephone Encounter (Signed)
[facility] Arac[facility] Bouche) have a coding question about diagnosis of stroke. If effects of the stroke is confirmed, left facial droop, left hemiparesis, dysarthria. Coder want to know so can code accordingly. Would like a call from the nurse. ?

## 2021-08-20 ENCOUNTER — Telehealth: Payer: Self-pay | Admitting: Internal Medicine

## 2021-08-20 NOTE — Telephone Encounter (Signed)
Called back and left a voicemail that she does not need a call back and has the situation "handled". ?

## 2021-08-20 NOTE — Telephone Encounter (Signed)
Hey Dr. Henrene Pastor,  ? ?Spoke with patient would like to see you again for diarrhea. She was a previous patient of yours in 2011 before having an office vist with [facility] [place]. Could you please advise on scheduling with you? ? ?Thank you.  ?

## 2021-08-20 NOTE — Telephone Encounter (Signed)
Given the busy nature of my practice, if she would like to reestablish with this practice (myself), then I require her to see one of our advanced practitioners first for initial assessment.  She can establish with me thereafter. ?Dr. Henrene Pastor ?

## 2021-09-03 ENCOUNTER — Telehealth: Payer: Self-pay | Admitting: Behavioral Health

## 2021-09-03 NOTE — Telephone Encounter (Signed)
Yes, that is my mistake. Please advise her to stop taking melatonin and reduce Zyprexa in half to 5 mg.  See if this helps.

## 2021-09-03 NOTE — Telephone Encounter (Signed)
See message from patient's daughter. Your last note mentions lowering Abilify to 5 mg next visit if stable, but wonder if you meant Zyprexa - Abilify not on med list.  ?

## 2021-09-03 NOTE — Telephone Encounter (Signed)
Daughter notified of recommendations

## 2021-09-03 NOTE — Telephone Encounter (Signed)
Patient's daughter([name] [name]) lvm at 10:26 today regarding medication. She states that [name] [name] "seems out of in the morning kinda in a brain fog, almost like a medication induced." She is currently taking melatonin and Olanzapine and is sleeping really good. She would like to discuss if she should stop taking the melatonin or perhaps taking half. Pls rtc [phone] ?

## 2021-09-09 ENCOUNTER — Ambulatory Visit (INDEPENDENT_AMBULATORY_CARE_PROVIDER_SITE_OTHER): Payer: Medicare HMO

## 2021-09-09 DIAGNOSIS — I639 Cerebral infarction, unspecified: Secondary | ICD-10-CM | POA: Diagnosis not present

## 2021-09-10 LAB — CUP PACEART REMOTE DEVICE CHECK
Date Time Interrogation Session: 20230416230909
Implantable Pulse Generator Implant Date: 20220126

## 2021-09-23 ENCOUNTER — Telehealth: Payer: Self-pay

## 2021-09-23 NOTE — Chronic Care Management (AMB) (Signed)
? ? ?  Chronic Care Management ?Pharmacy Assistant  ? ?Name: PAULEEN GOLEMAN  MRN: [id number] DOB: 04-01-1941 ? ? ?Reason for Encounter: General Adherence  ?  ? ?Recent office visits:  ?08/06/21-[name],NP-(Neurology)-F/U recurrent strokes,MCI,Increase use of CPAP to nightly use-no medication changes ? ?Recent consult visits:  ?07/29/21-Brian White,NP-(Behavioral Heath)-No data found ? ?Hospital visits:  ?None in previous 6 months ? ?Medications: ?Outpatient Encounter Medications as of 09/23/2021  ?Medication Sig  ? atenolol (TENORMIN) 25 MG tablet Take 1 tablet (25 mg total) by mouth 2 (two) times daily.  ? atorvastatin (LIPITOR) 40 MG tablet Take 1 tablet (40 mg total) by mouth daily.  ? Cholecalciferol (VITAMIN D3) 25 MCG (1000 UT) CAPS Take 1 capsule (1,000 Units total) by mouth daily.  ? donepezil (ARICEPT) 5 MG tablet TAKE 1 TABLET BY MOUTH DAILY.  ? melatonin 3 MG TABS tablet Take by mouth.  ? OLANZapine (ZYPREXA) 10 MG tablet Take 1 tablet (10 mg total) by mouth at bedtime.  ? polyethylene glycol powder (GLYCOLAX/MIRALAX) 17 GM/SCOOP powder Take 17 g by mouth daily as needed for mild constipation.  ? sertraline (ZOLOFT) 100 MG tablet Take 1.5 tablets (150 mg total) by mouth daily.  ? thiamine 100 MG tablet Take 1 tablet (100 mg total) by mouth daily.  ? ticagrelor (BRILINTA) 90 MG TABS tablet Take 1 tablet (90 mg total) by mouth 2 (two) times daily. Via [employer]Me pt assistance  ? ?No facility-administered encounter medications on file as of 09/23/2021.  ? ? ? ? ? ?Contacted [facility] on 09/23/21 for general disease state and medication adherence call.  ? ?Patient is not more than 5 days past due for refill on the following medications per chart history: ? ?[facility] Medications: ?Medication Name/mg Last Fill Days Supply ?Atorvastatin '40mg'$   08/19/21 90 ? ? ? ?What concerns do you have about your medications? No concerns at this time per the patient  ? ?The patient denies side effects with their medications.  ? ?How often  do you forget or accidentally miss a dose? Never ? ?Do you use a pillbox? Yes Patient reports her daughter will put out 1 week at a time  ? ?Are you having any problems getting your medications from your pharmacy? No  [facility] is good to the patient  ? ?Has the cost of your medications been a concern? Brilinta was of concern due to cost however per the patient this has been resolved with assistance now.AZ&Me ? ?Since last visit with CPP, the following interventions have been made. New Lipid Panel-Your recent cholesterol levels look excellent -your LDL or bad cholesterol is currently at 66 (down from 88) with goal of less than 70!  ? ?The patient has not had an ED visit since last contact.  ? ?The patient denies problems with their health.  ? ?Patient denies concerns or questions for [name], PharmD at this time.  ? ?Counseled patient on:  ?[place] job taking medications, Importance of taking medication daily without missed doses, Benefits of adherence packaging or a pillbox, and Access to CCM team for any cost, medication or pharmacy concerns. ? ? ?Care Gaps: ?Annual wellness visit in last year? Yes ?Most Recent BP reading:136/74  59-P  08/06/21 ? ? ?Upcoming appointments: ?CCM appointment on 01/15/22 ? ? ?[name], CPP notified ? ?Arlynn Mcdermid, CCMA ?Health concierge  ?[phone]  ?

## 2021-09-25 NOTE — Progress Notes (Signed)
Carelink Summary Report / Loop Recorder 

## 2021-10-03 ENCOUNTER — Other Ambulatory Visit: Payer: Self-pay | Admitting: Family Medicine

## 2021-10-04 ENCOUNTER — Telehealth: Payer: Self-pay | Admitting: Adult Health

## 2021-10-04 NOTE — Telephone Encounter (Signed)
PT [name] [name] with [facility] has called for verbal orders for 1 week 9 her call back # is [phone] with secure vm ?

## 2021-10-07 ENCOUNTER — Telehealth: Payer: Self-pay | Admitting: Adult Health

## 2021-10-07 NOTE — Telephone Encounter (Signed)
See note dated 10/04/21. ?

## 2021-10-07 NOTE — Telephone Encounter (Signed)
Called [name] [name]. PT and advised her verbal order for PT, Frequency:     1 x wk for 9 weeks is approved. She  verbalized understanding, appreciation. ? ?

## 2021-10-07 NOTE — Telephone Encounter (Signed)
[facility] And[facility] Kraft) request for verbal order for PT. ?Frequency: ? 1x wk for 9 wks ?

## 2021-10-15 ENCOUNTER — Ambulatory Visit (INDEPENDENT_AMBULATORY_CARE_PROVIDER_SITE_OTHER): Payer: Medicare HMO

## 2021-10-15 DIAGNOSIS — I639 Cerebral infarction, unspecified: Secondary | ICD-10-CM | POA: Diagnosis not present

## 2021-10-16 LAB — CUP PACEART REMOTE DEVICE CHECK
Date Time Interrogation Session: 20230519230320
Implantable Pulse Generator Implant Date: 20220126

## 2021-10-18 ENCOUNTER — Telehealth: Payer: Self-pay | Admitting: Adult Health

## 2021-10-18 NOTE — Telephone Encounter (Signed)
Pt's daughter,[name] Roof, she is not able to use the CPAP machine. Want to know if there is an alternative. Have scheduled appt on 10/24/21 at 10:45 am to discuss.

## 2021-10-22 NOTE — Telephone Encounter (Signed)
Verbally discussed call with JM, NP and she recommends the pt follow up with Dr. Rexene Alberts. Pt has been moved to 10/24/2021 at 845 am with Dr. Rexene Alberts.

## 2021-10-24 ENCOUNTER — Ambulatory Visit: Payer: Medicare HMO | Admitting: Neurology

## 2021-10-24 ENCOUNTER — Encounter: Payer: Self-pay | Admitting: Neurology

## 2021-10-24 ENCOUNTER — Ambulatory Visit: Payer: Medicare HMO | Admitting: Adult Health

## 2021-10-24 VITALS — BP 135/81 | HR 70 | Ht 60.0 in | Wt 124.6 lb

## 2021-10-24 DIAGNOSIS — R634 Abnormal weight loss: Secondary | ICD-10-CM

## 2021-10-24 DIAGNOSIS — G4733 Obstructive sleep apnea (adult) (pediatric): Secondary | ICD-10-CM | POA: Diagnosis not present

## 2021-10-24 DIAGNOSIS — Z9989 Dependence on other enabling machines and devices: Secondary | ICD-10-CM | POA: Diagnosis not present

## 2021-10-24 DIAGNOSIS — Z789 Other specified health status: Secondary | ICD-10-CM

## 2021-10-24 DIAGNOSIS — Z8673 Personal history of transient ischemic attack (TIA), and cerebral infarction without residual deficits: Secondary | ICD-10-CM

## 2021-10-24 DIAGNOSIS — G4731 Primary central sleep apnea: Secondary | ICD-10-CM | POA: Diagnosis not present

## 2021-10-24 NOTE — Progress Notes (Signed)
Subjective:    Patient ID: [name] [name] is a 81 y.o. female.  HPI    Interim history:   Ms. Gauger is a 81 year old right-handed woman with an underlying medical history of stroke, irritable bowel syndrome, hypertension, hyperlipidemia, reflux disease, depression, arthritis, and obesity, who presents for follow-up consultation of her obstructive sleep apnea. The patient is accompanied by her daughter today. I last saw her on 06/14/20 at which time she was compliant with her AutoPap.  She did not get a new machine as she was not quite eligible for new machine at the time.  She tried a fullface mask which was difficult to tolerate and she was using a nasal cushion at the time.  She was advised to follow-up for sleep apnea management in 1 year.    She has seen Dr. Leonie Man and Frann Rider on a regular basis in the interim.   Today, 10/24/21: I reviewed her AutoPap compliance data for the past 90 days from 07/26/2021 through 10/23/2021, during which time she used her machine 33 days with percent use days greater than 4 hours at 10% only, indicating low compliance with an average usage for days on treatment of 2 hours and 48 minutes, residual AHI elevated at 24.3/h, primarily due to central events with a central apnea index of 12.6/h.  Leak on the higher side, 95th percentile leak of 22 L/min.  She reports difficulty tolerating the pressure, it feels too high, the pressure range is 7 to 13 cm with the 95th percentile at 9.7 cm.  Maximum pressure of 10.4 cm for these past 3 months.  She has trouble putting her mask on, it leaks, the nasal cushion was not possible for her to continue.  She received a small P10 interface about a month ago which is easier to put on.  She has residual weakness from her strokes and mild facial weakness as well.  She has no difficulty swallowing or speaking as such but slight dysarthria.  She was wondering if she is a candidate for inspire but we talked about again today and mutually  agreed to not pursue surgical treatment.  In addition, having more than 50% of central events excludes her from inspire candidacy at this time.  She has lost quite a bit of weight, compared to late 2021 when she had a home sleep test she has lost over 40 pounds.  We mutually agreed to pursue home sleep testing and reducing her pressure settings in the interim.   The patient's allergies, current medications, family history, past medical history, past social history, past surgical history and problem list were reviewed and updated as appropriate.    Previously:    I first met her at the request of Dr. Leonie Man and Frann Rider, NP on 03/13/2020, at which time she reported a prior diagnosis of obstructive sleep apnea.  She was on autoPAP therapy at the time and compliant with treatment.  She qualified for new equipment.  We mutually agreed to pursue sleep testing.  She had a home sleep test on 04/03/2020 which indicated severe obstructive sleep apnea by number of events with an AHI of 50.6/h, O2 nadir was 86%.  She was advised to start treatment with new equipment in the form of AutoPap therapy.   I reviewed her AutoPap compliance data from 05/14/2020 through 06/12/2020, which is a total of 30 days, during which time she used her machine every night with percent use days greater than 4 hours at 100%, indicating superb compliance  with an average usage of 7 hours and 29 minutes, residual AHI mildly elevated at 8.7/h, 95th percentile of pressure at 10.8, 95th percentile of leak at 12.8 L/min, pressure range of 7 to 13 cm with EPR, she does have residual central apneas with a central apnea index of 5.9/h, obstructive apnea index of 2.2/h.      03/13/20: (She) was previously diagnosed with obstructive sleep apnea and placed on CPAP therapy.  I reviewed office note from 03/01/2020.  She sustained a recent stroke in the left frontal area in September 2021.  She had a sleep study several years ago.  Prior sleep study  results are not available for my review today.  Her Epworth sleepiness score is 9 out of 24, fatigue severity score is 20 out of 63.  She has a CPAP machine, set up date from what we could see in online [facility] records was 06/15/2014.  She has [employer] as her DME company.  I was able to review her compliance data from 02/12/2020 through 03/12/2020, which is a total of 30 days, during which time she used her machine 25 days with percent use days greater than 4 hours at 80%, indicating very good compliance with an average usage of 6 hours and 36 minutes, residual AHI at goal at 2.2/h, 95th percentile of pressure at 11.2 cm with a range of 9 to 19 cm with EPR of 3.  Leak on the high side with a 95th percentile at 32 L/min.  She reports overall doing well with her current machine which is nearly 79 years old.  This is her second CPAP machine.  Sleep study testing was years ago, she recalls going to [facility] in [place]. She has an appointment pending with Dr. Rayann Heman to discuss loop monitor placement. She had fallen last month and twisted her ankle.  She has seen her orthopedic surgeon, Dr. Maureen Ralphs for right ankle pain and ongoing arthritis affecting her right knee.  She may need at some point a right knee replacement.  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  She has nocturia about 2-3 times per average night.  She lives alone, is widowed, has twin sons and 1 daughter.  She has a call alert button and also Alexa in 2 places at her house.  She drinks caffeine in the form of coffee, 1 cup/day, no alcohol, non-smoker.  She goes to bed between 10 and 11 and likes to listen to [place].  She has no pets in the household.   Her Past Medical History Is Significant For: Past Medical History:  Diagnosis Date   Arthritis    knees   Depression    Fatty liver    GERD (gastroesophageal reflux disease)    Hyperlipidemia    Hypertension    IBS (irritable bowel  syndrome)    Obesity    Pneumonia    Sleep apnea    uses CPAP   Stroke (cerebrum) ([place])     Her Past Surgical History Is Significant For: Past Surgical History:  Procedure Laterality Date   BLADDER SURGERY     BREAST SURGERY     breast biopsy   BROW LIFT Bilateral 04/14/2017   Procedure: BLEPHAROPLASTY UPPER EYELID WITH EXCESS SKIN;  Surgeon: Karle Starch, MD;  Location: [place];  Service: Ophthalmology;  Laterality: Bilateral;   CATARACT EXTRACTION W/PHACO Left 02/05/2016   Procedure: CATARACT EXTRACTION PHACO AND INTRAOCULAR LENS PLACEMENT ([place]);  Surgeon: Birder Robson,  MD;  Location: [facility];  Service: Ophthalmology;  Laterality: Left;  [place] 01:10 AP% 22.3 CDE 15.67 Fluid pack lot # [id number]   CATARACT EXTRACTION W/PHACO Right 02/26/2016   Procedure: CATARACT EXTRACTION PHACO AND INTRAOCULAR LENS PLACEMENT ([facility]);  Surgeon: Birder Robson, MD;  Location: [facility];  Service: Ophthalmology;  Laterality: Right;  [place] 57.4 AP% 24.0 CDE 13.75 Fluid Pack lot # [id number]   CHOLECYSTECTOMY     COLONOSCOPY WITH PROPOFOL N/A 12/18/2014   Procedure: COLONOSCOPY WITH PROPOFOL;  Surgeon: Manya Silvas, MD;  [facility];  Service: Endoscopy;  Laterality: N/A;   DEEP NECK LYMPH NODE BIOPSY / EXCISION     implantable loop recorder implantation  06/20/2020   Medtronic Reveal Linq model LNQ 22 ([place] [id number]) implantable loop recorder for cryptogenic stroke   JOINT REPLACEMENT     KNEE ARTHROPLASTY Left 06/20/2015   Procedure: COMPUTER ASSISTED TOTAL KNEE ARTHROPLASTY;  Surgeon: Dereck Leep, MD;  Location: [facility];  Service: Orthopedics;  Laterality: Left;   PTOSIS REPAIR Bilateral 04/14/2017   Procedure: PTOSIS REPAIR RESECT EX;  Surgeon: Karle Starch, MD;  Location: [place];  Service: Ophthalmology;  Laterality: Bilateral;  sleep apnea   TONSILLECTOMY     TOTAL HIP ARTHROPLASTY Right 07/21/2018   Procedure: TOTAL HIP ARTHROPLASTY ANTERIOR APPROACH;   Surgeon: Gaynelle Arabian, MD;  Location: [facility];  Service: Orthopedics;  Laterality: Right;    Her Family History Is Significant For: Family History  Problem Relation Age of Onset   Heart attack Mother    Dementia Father    Heart attack Father 21   Aortic aneurysm Brother    Stroke Paternal Grandfather    Colon cancer Neg Hx    Breast cancer Neg Hx     Her Social History Is Significant For: Social History   Socioeconomic History   Marital status: Widowed    Spouse name: Not on file   Number of children: Not on file   Years of education: Not on file   Highest education level: Not on file  Occupational History   Occupation: retired  Tobacco Use   Smoking status: Never   Smokeless tobacco: Never  Vaping Use   Vaping Use: Never used  Substance and Sexual Activity   Alcohol use: Not Currently    Comment: ocassional glass of wine 2-3 times a year   Drug use: No   Sexual activity: Never  Other Topics Concern   Not on file  Social History Narrative   Lives alone in home   Right Handed   Drinks 1-2 cups caffeine daily   Social Determinants of Health   Financial Resource Strain: Low Risk    Difficulty of Paying Living Expenses: Not very hard  Food Insecurity: No Food Insecurity   Worried About Charity fundraiser in the Last Year: Never true   [place] in the Last Year: Never true  Transportation Needs: No Transportation Needs   Lack of Transportation (Medical): No   Lack of Transportation (Non-Medical): No  Physical Activity: Insufficiently Active   Days of Exercise per Week: 3 days   Minutes of Exercise per Session: 10 min  Stress: No Stress Concern Present   Feeling of Stress : Not at all  Social Connections: Socially Isolated   Frequency of Communication with Friends and Family: More than three times a week   Frequency of Social Gatherings with Friends and Family: More than three times a week   Attends Religious  Services: Never   Active Member of Clubs or  Organizations: No   Attends Archivist Meetings: Never   Marital Status: Widowed    Her Allergies Are:  Allergies  Allergen Reactions   Hydrocodone Nausea Only    Noted after surgery, may be able to tolerate with food  :   Her Current Medications Are:  Outpatient Encounter Medications as of 10/24/2021  Medication Sig   atenolol (TENORMIN) 25 MG tablet TAKE 1 TABLET BY MOUTH 2 TIMES DAILY.   atorvastatin (LIPITOR) 40 MG tablet Take 1 tablet (40 mg total) by mouth daily.   Cholecalciferol (VITAMIN D3) 25 MCG (1000 UT) CAPS Take 1 capsule (1,000 Units total) by mouth daily.   donepezil (ARICEPT) 5 MG tablet TAKE 1 TABLET BY MOUTH DAILY.   OLANZapine (ZYPREXA) 10 MG tablet Take 1 tablet (10 mg total) by mouth at bedtime.   polyethylene glycol powder (GLYCOLAX/MIRALAX) 17 GM/SCOOP powder Take 17 g by mouth daily as needed for mild constipation.   sertraline (ZOLOFT) 100 MG tablet Take 1.5 tablets (150 mg total) by mouth daily.   thiamine 100 MG tablet Take 1 tablet (100 mg total) by mouth daily.   ticagrelor (BRILINTA) 90 MG TABS tablet Take 1 tablet (90 mg total) by mouth 2 (two) times daily. Via [employer] pt assistance   melatonin 3 MG TABS tablet Take by mouth.   No facility-administered encounter medications on file as of 10/24/2021.  :  Review of Systems:  Out of a complete 14 point review of systems, all are reviewed and negative with the exception of these symptoms as listed below:  Review of Systems  Neurological:        Pt is here for CPAP follow up  Pt states there is too much air pressure when she puts mask on at night . Pt states she has changed mask and she feels the pressure worse .    Objective:  Neurological Exam  Physical Exam Physical Examination:   Vitals:   10/24/21 0842  BP: 135/81  Pulse: 70   General Examination: The patient is a very pleasant 81 y.o. female in no acute distress.  Well-groomed.    HEENT: Normocephalic, atraumatic, pupils are equal,  round and reactive to light, extraocular tracking is good without limitation to gaze excursion or nystagmus noted. Hearing is grossly intact. Face is slightly asymmetric with mild left facial weakness noted, good tongue movements, airway examination reveals stable findings, speech with slight dysarthria.    Chest: Clear to auscultation without wheezing, rhonchi or crackles noted.   Heart: S1+S2+0, regular and normal without murmurs, rubs or gallops noted.    Abdomen: Soft, non-tender and non-distended.   Extremities: There is no obvious edema in the distal lower extremities.    Skin: Warm and dry without trophic changes noted.    Musculoskeletal: exam reveals pain in the right knee. She is status post left total knee replacement    Neurologically:  Mental status: The patient is awake, alert and oriented in all 4 spheres. Her immediate and remote memory, attention, language skills and fund of knowledge are mildly impaired.   She has a tendency to repeat herself or asked the same question again.  Mood and affect normal. Cranial nerves II - XII are as described above under HEENT exam.  Motor exam: Thin bulk, global strength of about 4 out of 5. Fine motor skills and coordination: Globally mildly impaired. .  Cerebellar testing: No dysmetria or intention tremor. There is no  truncal or gait ataxia.  Sensory exam: intact to light touch in the upper and lower extremities.  Gait, station and balance: She stands with mild difficulty. She walks with a rolling walker.    Assessment and Plan:  In summary, Jaliah Foody Skipper is a very pleasant 81 year old female with an underlying medical history of multiple strokes, irritable bowel syndrome, hypertension, hyperlipidemia, reflux disease, depression, arthritis, and obesity, who presents for follow-up consultation of her obstructive sleep apnea.  She was originally diagnosed several years ago and had been using an AutoPap machine.  She had an interim home sleep  test on 04/02/2020 which confirmed her sleep apnea diagnosis, overall AHI was 50.6/h, O2 nadir 86%.  She is using her previous AutoPap machine.  Pressure setting of 7-13.  She has lost quite a bit of weight, she is trying to use her AutoPap but pressure seems too high.  She has trouble with the interface but can tolerate the nasal pillows at this time.  I will reduce her pressure settings to 4-10 centimeters at this time.  We talked about inspire again today.  She has quite a bit of central apneas at this time and would not be considered a candidate but also from her medical history standpoint and stroke risk, I would not favor any surgical approach for her.  She is advised to try her current AutoPap on the lower pressure settings and we will repeat home sleep testing to reevaluate the severity of her sleep apnea and see if she has primary central apneas.  She is reminded to wear no nail polish at the time of her home testing and not wear her AutoPap nightly to get diagnostic data.  Her daughter will pick up the test equipment most likely.  We will call with the test results.  She was able to make the pressure change today on the website.  We will plan a follow-up in sleep clinic accordingly.  She should be eligible for new machine, we will plan this after her sleep testing.  I answered all their questions today and the patient and her daughter were in agreement. I spent 40 minutes in total face-to-face time and in reviewing records during pre-charting, more than 50% of which was spent in counseling and coordination of care, reviewing test results, reviewing medications and treatment regimen and/or in discussing or reviewing the diagnosis of OSA, the prognosis and treatment options. Pertinent laboratory and imaging test results that were available during this visit with the patient were reviewed by me and considered in my medical decision making (see chart for details).

## 2021-10-28 ENCOUNTER — Encounter: Payer: Self-pay | Admitting: Neurology

## 2021-10-29 ENCOUNTER — Telehealth: Payer: Medicare HMO | Admitting: Behavioral Health

## 2021-10-29 ENCOUNTER — Encounter: Payer: Self-pay | Admitting: Behavioral Health

## 2021-10-29 DIAGNOSIS — R630 Anorexia: Secondary | ICD-10-CM | POA: Diagnosis not present

## 2021-10-29 DIAGNOSIS — F22 Delusional disorders: Secondary | ICD-10-CM | POA: Diagnosis not present

## 2021-10-29 DIAGNOSIS — F411 Generalized anxiety disorder: Secondary | ICD-10-CM

## 2021-10-29 DIAGNOSIS — F99 Mental disorder, not otherwise specified: Secondary | ICD-10-CM | POA: Diagnosis not present

## 2021-10-29 DIAGNOSIS — R4189 Other symptoms and signs involving cognitive functions and awareness: Secondary | ICD-10-CM | POA: Diagnosis not present

## 2021-10-29 DIAGNOSIS — F5105 Insomnia due to other mental disorder: Secondary | ICD-10-CM | POA: Diagnosis not present

## 2021-10-29 DIAGNOSIS — F331 Major depressive disorder, recurrent, moderate: Secondary | ICD-10-CM

## 2021-10-29 MED ORDER — SERTRALINE HCL 100 MG PO TABS: 200.0000 mg | ORAL_TABLET | Freq: Every day | ORAL | 3 refills | Status: DC

## 2021-10-29 NOTE — Progress Notes (Signed)
[name] [name] [id number] 1941-04-04 81 y.o.  Virtual Visit via Video Note  I connected with pt @ on 10/29/21 at 11:00 AM EDT by a video enabled telemedicine application and verified that I am speaking with the correct person using two identifiers.   I discussed the limitations of evaluation and management by telemedicine and the availability of in person appointments. The patient expressed understanding and agreed to proceed.  I discussed the assessment and treatment plan with the patient. The patient was provided an opportunity to ask questions and all were answered. The patient agreed with the plan and demonstrated an understanding of the instructions.   The patient was advised to call back or seek an in-person evaluation if the symptoms worsen or if the condition fails to improve as anticipated.  I provided 30 minutes of non-face-to-face time during this encounter.  The patient was located at home.  The provider was located at [place].   Elwanda Brooklyn, NP   Subjective:   Patient ID:  [name] [name] is a 81 y.o. (DOB 14-Sep-1940) female.  Chief Complaint:  Chief Complaint  Patient presents with   Depression   Follow-up   Anxiety   Medication Refill   Medication Problem    HPI  BREEAN NANNINI presents for follow-up and medication management. She is alert and cognitively sound today. She appears to be a little more sluggish this visit or fatigued. Her daughter is present with her consent. She feels like she has become a little more depressed recently. Has not wanted to leave her home very much.   No paranoid thoughts anymore. Has been living independent and able to conduct all ADL's.  She says that she would like to inquired about adjusting her medication this visit. Her daughter had decreased Zyprexa to 5 mg at one point but then noticed an increase in worry and fear. She increased it back to 10 mg.  Anxiety level today is 2/10 and depression is 5/10. She is sleeping  with aid of medications 7-8 hours per night. No mania or psychosis present. No auditory or visual hallucinations. No paranoia or delusional thinking. No SI/HI.    Review of Systems:  Review of Systems  Medications: I have reviewed the patient's current medications.  Current Outpatient Medications  Medication Sig Dispense Refill   sertraline (ZOLOFT) 100 MG tablet Take 2 tablets (200 mg total) by mouth daily. 60 tablet 3   atenolol (TENORMIN) 25 MG tablet TAKE 1 TABLET BY MOUTH 2 TIMES DAILY. 180 tablet 1   atorvastatin (LIPITOR) 40 MG tablet Take 1 tablet (40 mg total) by mouth daily. 90 tablet 3   Cholecalciferol (VITAMIN D3) 25 MCG (1000 UT) CAPS Take 1 capsule (1,000 Units total) by mouth daily.     donepezil (ARICEPT) 5 MG tablet TAKE 1 TABLET BY MOUTH DAILY. 90 tablet 1   melatonin 3 MG TABS tablet Take by mouth.     OLANZapine (ZYPREXA) 10 MG tablet Take 1 tablet (10 mg total) by mouth at bedtime. 30 tablet 3   polyethylene glycol powder (GLYCOLAX/MIRALAX) 17 GM/SCOOP powder Take 17 g by mouth daily as needed for mild constipation.     sertraline (ZOLOFT) 100 MG tablet Take 1.5 tablets (150 mg total) by mouth daily. 45 tablet 3   thiamine 100 MG tablet Take 1 tablet (100 mg total) by mouth daily. 90 tablet 1   ticagrelor (BRILINTA) 90 MG TABS tablet Take 1 tablet (90 mg total) by mouth 2 (two) times  daily. Via [employer] pt assistance 180 tablet 0   No current facility-administered medications for this visit.    Medication Side Effects: None  Allergies:  Allergies  Allergen Reactions   Hydrocodone Nausea Only    Noted after surgery, may be able to tolerate with food    Past Medical History:  Diagnosis Date   Arthritis    knees   Depression    Fatty liver    GERD (gastroesophageal reflux disease)    Hyperlipidemia    Hypertension    IBS (irritable bowel syndrome)    Obesity    Pneumonia    Sleep apnea    uses CPAP   Stroke (cerebrum) ([place])     Family History   Problem Relation Age of Onset   Heart attack Mother    Dementia Father    Heart attack Father 54   Aortic aneurysm Brother    Stroke Paternal Grandfather    Colon cancer Neg Hx    Breast cancer Neg Hx     Social History   Socioeconomic History   Marital status: Widowed    Spouse name: Not on file   Number of children: Not on file   Years of education: Not on file   Highest education level: Not on file  Occupational History   Occupation: retired  Tobacco Use   Smoking status: Never   Smokeless tobacco: Never  Vaping Use   Vaping Use: Never used  Substance and Sexual Activity   Alcohol use: Not Currently    Comment: ocassional glass of wine 2-3 times a year   Drug use: No   Sexual activity: Never  Other Topics Concern   Not on file  Social History Narrative   Lives alone in home   Right Handed   Drinks 1-2 cups caffeine daily   Social Determinants of Health   Financial Resource Strain: Low Risk    Difficulty of Paying Living Expenses: Not very hard  Food Insecurity: No Food Insecurity   Worried About Charity fundraiser in the Last Year: Never true   [place] in the Last Year: Never true  Transportation Needs: No Transportation Needs   Lack of Transportation (Medical): No   Lack of Transportation (Non-Medical): No  Physical Activity: Insufficiently Active   Days of Exercise per Week: 3 days   Minutes of Exercise per Session: 10 min  Stress: No Stress Concern Present   Feeling of Stress : Not at all  Social Connections: Socially Isolated   Frequency of Communication with Friends and Family: More than three times a week   Frequency of Social Gatherings with Friends and Family: More than three times a week   Attends Religious Services: Never   Marine scientist or Organizations: No   Attends Archivist Meetings: Never   Marital Status: Widowed  Human resources officer Violence: Not At Risk   Fear of Current or Ex-Partner: No   Emotionally  Abused: No   Physically Abused: No   Sexually Abused: No    Past Medical History, Surgical history, Social history, and Family history were reviewed and updated as appropriate.   Please see review of systems for further details on the patient's review from today.   Objective:   Physical Exam:  There were no vitals taken for this visit.  Physical Exam  Lab Review:     Component Value Date/Time   NA 134 (L) 10/26/2020 2152   NA 141 05/31/2015 1136  NA 143 11/26/2012 1630   K 4.3 10/26/2020 2152   K 4.3 11/26/2012 1630   CL 98 10/26/2020 2152   CL 110 (H) 11/26/2012 1630   CO2 25 10/26/2020 2152   CO2 27 11/26/2012 1630   GLUCOSE 109 (H) 10/26/2020 2152   GLUCOSE 97 11/26/2012 1630   BUN 15 10/26/2020 2152   BUN 15 05/31/2015 1136   BUN 14 11/26/2012 1630   CREATININE 0.89 10/26/2020 2152   CREATININE 0.88 11/26/2012 1630   CALCIUM 10.0 10/26/2020 2152   CALCIUM 9.1 11/26/2012 1630   PROT 7.1 10/26/2020 2152   PROT 6.5 05/31/2015 1136   PROT 6.7 11/26/2012 1630   ALBUMIN 4.3 10/26/2020 2152   ALBUMIN 4.2 05/31/2015 1136   ALBUMIN 3.7 11/26/2012 1630   AST 25 10/26/2020 2152   AST 33 11/26/2012 1630   ALT 21 10/26/2020 2152   ALT 27 11/26/2012 1630   ALKPHOS 146 (H) 10/26/2020 2152   ALKPHOS 96 11/26/2012 1630   BILITOT 1.1 10/26/2020 2152   BILITOT 0.4 05/31/2015 1136   BILITOT 0.3 11/26/2012 1630   GFRNONAA >60 10/26/2020 2152   GFRNONAA >60 11/26/2012 1630   GFRAA >60 02/18/2020 0723   GFRAA >60 11/26/2012 1630       Component Value Date/Time   WBC 9.6 10/26/2020 2151   RBC 5.11 10/26/2020 2151   HGB 16.1 (H) 10/26/2020 2151   HGB 15.7 05/31/2015 1136   HCT 48.8 (H) 10/26/2020 2151   HCT 45.0 05/31/2015 1136   PLT 305 10/26/2020 2151   PLT 280 05/31/2015 1136   MCV 95.5 10/26/2020 2151   MCV 89 05/31/2015 1136   MCV 91 11/26/2012 1630   MCH 31.5 10/26/2020 2151   MCHC 33.0 10/26/2020 2151   RDW 14.0 10/26/2020 2151   RDW 12.6 05/31/2015 1136    RDW 12.8 11/26/2012 1630   LYMPHSABS 1.9 10/26/2020 2151   LYMPHSABS 2.0 05/31/2015 1136   MONOABS 0.8 10/26/2020 2151   EOSABS 0.2 10/26/2020 2151   EOSABS 0.2 05/31/2015 1136   BASOSABS 0.1 10/26/2020 2151   BASOSABS 0.1 05/31/2015 1136    No results found for: POCLITH, LITHIUM   No results found for: PHENYTOIN, PHENOBARB, VALPROATE, CBMZ   .res Assessment: Plan:     Recommendations/Plan  To continue  Zyprexa 10 mg daily To increase Zoloft  to 200 mg daily. Will report worsening symptoms, acute further decline in cognition or behaviors. Provided after hours emergency contact information Discussed potential metabolic side effects associated with atypical antipsychotics, as well as potential risk for movement side effects. Advised pt to contact office if movement side effects occur.  Greater than 50% of 20  min. video visit time with patient was spent on counseling and coordination of care. We discussed her recent improvement with anxiety and depression. Paranoia and delusional thinking has been absent for several months now.  Daughter indicated that no medication changes desired at this time. Dicussed the possibility of lowering her Abilify to 5 mg next visit if she is still stable. Discussed potential metabolic side effects associated with atypical antipsychotics, as well as potential risk for movement side effects. Advised pt to contact office if movement side effects occur.    Aaron Edelman A. Breckyn Troyer, NP    Petina was seen today for depression, follow-up, anxiety, medication refill and medication problem.  Diagnoses and all orders for this visit:  Generalized anxiety disorder -     sertraline (ZOLOFT) 100 MG tablet; Take 2 tablets (200 mg total) by  mouth daily.  Cognitive decline  Major depressive disorder, recurrent episode, moderate (HCC) -     sertraline (ZOLOFT) 100 MG tablet; Take 2 tablets (200 mg total) by mouth daily.  Paranoia ([place])  Insomnia due to other mental  disorder  Lack of appetite     Please see After Visit Summary for patient specific instructions.  Future Appointments  Date Time Provider [place]  11/18/2021  7:05 AM [facility] DEVICE REMOTES [facility]  12/23/2021  7:05 AM [facility] DEVICE REMOTES [facility]  01/15/2022  3:00 PM [facility] PHARMACIST [facility]  01/28/2022  7:05 AM [facility] DEVICE REMOTES [facility]  02/06/2022  1:15 PM McCue, Janett Billow, NP [facility] None  03/03/2022  7:00 AM [facility] DEVICE REMOTES [facility]  04/07/2022  7:00 AM [facility] DEVICE REMOTES [facility]  05/01/2022  9:45 AM [facility] NURSE HEALTH ADVISOR [facility]  05/12/2022  7:00 AM [facility] DEVICE REMOTES [facility]  06/16/2022  7:00 AM [facility] DEVICE REMOTES [facility]  07/21/2022  7:00 AM [facility] DEVICE REMOTES [facility]  08/25/2022  7:00 AM [facility] DEVICE REMOTES [facility]  09/29/2022  7:00 AM [facility] DEVICE REMOTES [facility]    No orders of the defined types were placed in this encounter.     -------------------------------

## 2021-10-29 NOTE — Progress Notes (Signed)
Carelink Summary Report / Loop Recorder 

## 2021-11-08 ENCOUNTER — Encounter: Payer: Self-pay | Admitting: Family Medicine

## 2021-11-08 ENCOUNTER — Ambulatory Visit (INDEPENDENT_AMBULATORY_CARE_PROVIDER_SITE_OTHER): Payer: Medicare HMO | Admitting: Family Medicine

## 2021-11-08 VITALS — BP 120/70 | HR 58 | Temp 98.0°F | Ht 60.0 in | Wt 123.0 lb

## 2021-11-08 DIAGNOSIS — R197 Diarrhea, unspecified: Secondary | ICD-10-CM

## 2021-11-08 NOTE — Progress Notes (Unsigned)
Weight has been steady.    Diarrhea.  Going on for several months. Patient states she does have some occ hard stools at times but mostly ends up being diarrhea every couple of days. She has had no recent illnesses or change in diet.  She hasn't used stool softeners or laxatives.  No abd pain.  No bloody or black stools.  No fevers.  No sick contacts.  No new meds. No specific trigger foods.  She is taking dairy, ice cream.  No change with lactose free milk.  She does drink some diet drinks, unclear if that is causative.  No vomiting.  No nausea.  No drinking well water.    Meds, vitals, and allergies reviewed.   ROS: Per HPI unless specifically indicated in ROS section   GEN: nad, alert and oriented HEENT: ncat NECK: supple w/o LA CV: rrr.   PULM: ctab, no inc wob ABD: soft, +bs EXT: no edema SKIN: Well-perfused.

## 2021-11-08 NOTE — Patient Instructions (Signed)
Take care.  Glad to see you.  Go to the lab on the way out.   If you have mychart we'll likely use that to update you.     I would take 1/2- 1 dose of metamucil a day with water.  I would limit artificial sweeteners.    Update me as needed.

## 2021-11-09 LAB — CBC WITH DIFFERENTIAL/PLATELET
Absolute Monocytes: 469 cells/uL (ref 200–950)
Basophils Absolute: 48 cells/uL (ref 0–200)
Basophils Relative: 0.7 %
Eosinophils Absolute: 110 cells/uL (ref 15–500)
Eosinophils Relative: 1.6 %
HCT: 42.5 % (ref 35.0–45.0)
Hemoglobin: 14.7 g/dL (ref 11.7–15.5)
Lymphs Abs: 1297 cells/uL (ref 850–3900)
MCH: 30.8 pg (ref 27.0–33.0)
MCHC: 34.6 g/dL (ref 32.0–36.0)
MCV: 89.1 fL (ref 80.0–100.0)
MPV: 8.9 fL (ref 7.5–12.5)
Monocytes Relative: 6.8 %
Neutro Abs: 4975 cells/uL (ref 1500–7800)
Neutrophils Relative %: 72.1 %
Platelets: 233 10*3/uL (ref 140–400)
RBC: 4.77 10*6/uL (ref 3.80–5.10)
RDW: 12.2 % (ref 11.0–15.0)
Total Lymphocyte: 18.8 %
WBC: 6.9 10*3/uL (ref 3.8–10.8)

## 2021-11-09 LAB — COMPREHENSIVE METABOLIC PANEL
AG Ratio: 2 (calc) (ref 1.0–2.5)
ALT: 10 U/L (ref 6–29)
AST: 17 U/L (ref 10–35)
Albumin: 4.1 g/dL (ref 3.6–5.1)
Alkaline phosphatase (APISO): 106 U/L (ref 37–153)
BUN: 12 mg/dL (ref 7–25)
CO2: 23 mmol/L (ref 20–32)
Calcium: 10.1 mg/dL (ref 8.6–10.4)
Chloride: 107 mmol/L (ref 98–110)
Creat: 0.89 mg/dL (ref 0.60–1.00)
Globulin: 2.1 g/dL (calc) (ref 1.9–3.7)
Glucose, Bld: 84 mg/dL (ref 65–99)
Potassium: 4.1 mmol/L (ref 3.5–5.3)
Sodium: 142 mmol/L (ref 135–146)
Total Bilirubin: 0.5 mg/dL (ref 0.2–1.2)
Total Protein: 6.2 g/dL (ref 6.1–8.1)

## 2021-11-10 NOTE — Assessment & Plan Note (Signed)
Much less likely to be infectious so I think it makes sense to defer stool study at this point.  Check routine labs today. Discussed adding on fiber. I would take 1/2- 1 dose of metamucil a day with water.  I would limit artificial sweeteners in case those are contributing. Update me as needed.  She agrees with plan.  Okay for outpatient follow-up.

## 2021-11-12 ENCOUNTER — Telehealth: Payer: Self-pay | Admitting: Neurology

## 2021-11-12 ENCOUNTER — Encounter: Payer: Self-pay | Admitting: Neurology

## 2021-11-12 NOTE — Telephone Encounter (Signed)
[employer] no Josem Kaufmann req spoke to [place] ref # [id number]. Patient is scheduled at [facility] for 12/03/21 at 3:30 pm.

## 2021-11-18 ENCOUNTER — Ambulatory Visit (INDEPENDENT_AMBULATORY_CARE_PROVIDER_SITE_OTHER): Payer: Medicare HMO

## 2021-11-18 ENCOUNTER — Encounter: Payer: Self-pay | Admitting: Physician Assistant

## 2021-11-18 DIAGNOSIS — I639 Cerebral infarction, unspecified: Secondary | ICD-10-CM

## 2021-11-19 LAB — CUP PACEART REMOTE DEVICE CHECK
Date Time Interrogation Session: 20230621230935
Implantable Pulse Generator Implant Date: 20220126

## 2021-11-20 ENCOUNTER — Other Ambulatory Visit: Payer: Self-pay | Admitting: Adult Health

## 2021-12-03 ENCOUNTER — Ambulatory Visit (INDEPENDENT_AMBULATORY_CARE_PROVIDER_SITE_OTHER): Payer: Medicare HMO | Admitting: Neurology

## 2021-12-03 DIAGNOSIS — Z8673 Personal history of transient ischemic attack (TIA), and cerebral infarction without residual deficits: Secondary | ICD-10-CM

## 2021-12-03 DIAGNOSIS — G4731 Primary central sleep apnea: Secondary | ICD-10-CM

## 2021-12-03 DIAGNOSIS — Z789 Other specified health status: Secondary | ICD-10-CM

## 2021-12-03 DIAGNOSIS — R634 Abnormal weight loss: Secondary | ICD-10-CM

## 2021-12-03 DIAGNOSIS — G4733 Obstructive sleep apnea (adult) (pediatric): Secondary | ICD-10-CM

## 2021-12-05 NOTE — Progress Notes (Signed)
See procedure note.

## 2021-12-05 NOTE — Addendum Note (Signed)
Addended by: Star Age on: 12/05/2021 06:37 PM   Modules accepted: Orders

## 2021-12-05 NOTE — Procedures (Signed)
   [facility]  HOME SLEEP TEST (Watch PAT) REPORT  STUDY DATE: 12/03/2021  DOB: Apr 05, 1941  MRN: [id number]  ORDERING CLINICIAN: Star Age, MD, PhD   CLINICAL INFORMATION/HISTORY: 81 year old right-handed woman with an underlying medical history of stroke, irritable bowel syndrome, hypertension, hyperlipidemia, reflux disease, depression, arthritis, and obesity, who presents for reevaluation of her sleep apnea with concern for primary central apneas.  She has been on AutoPap therapy with difficulty tolerating treatment.  She has had interim weight loss as well.  Epworth sleepiness score: 9/24.  BMI: 33.3 kg/m  FINDINGS:   Sleep Summary:   Total Recording Time (hours, min): 8 hours, 23 min  Total Sleep Time (hours, min):  7 hours, 39 min  Percent REM (%):    17.8%   Respiratory Indices:   Calculated pAHI (per hour):  37.7/hour         REM pAHI:    28.8/hour       NREM pAHI: 39.7/hour  Central pAHI: 20.8/hour  Oxygen Saturation Statistics:    Oxygen Saturation (%) Mean: 94%   Minimum oxygen saturation (%):                 88%   O2 Saturation Range (%): 88-99%    O2 Saturation (minutes) <=88%: 0 min  Pulse Rate Statistics:   Pulse Mean (bpm):    62/min    Pulse Range (54-83/min)   IMPRESSION: Primary central sleep apnea Obstructive sleep apnea  RECOMMENDATION:  This home sleep test demonstrates severe sleep disordered breathing with a primary central component.  Mild to moderate snoring was detected.  Treatment with AutoPap is unlikely to resolve her central sleep disordered breathing.  The patient will be advised to return for a titration study, and she may benefit from BiPAP therapy, BiPAP ST or ASV to resolve central sleep apneas. A laboratory attended titration study will be requested.   Alternative treatment options will be limited secondary to the central nature of her sleep disordered breathing.  The patient should be cautioned not  to drive, work at heights, or operate dangerous or heavy equipment when tired or sleepy. Review and reiteration of good sleep hygiene measures should be pursued with any patient. Other causes of the patient's symptoms, including circadian rhythm disturbances, an underlying mood disorder, medication effect and/or an underlying medical problem cannot be ruled out based on this test. Clinical correlation is recommended. The patient and his referring provider will be notified of the test results. The patient will be seen in follow up in sleep clinic at [facility].  I certify that I have reviewed the raw data recording prior to the issuance of this report in accordance with the standards of the American Academy of Sleep Medicine (AASM).  INTERPRETING PHYSICIAN:   Star Age, MD, PhD  Board Certified in Neurology and Sleep Medicine  [facility] [address] [place], [place] [address] [phone]

## 2021-12-09 ENCOUNTER — Telehealth: Payer: Self-pay

## 2021-12-09 NOTE — Telephone Encounter (Signed)
I called pt. No answer, left a message asking pt to call me back.   

## 2021-12-09 NOTE — Telephone Encounter (Signed)
-----   Message from Star Age, MD sent at 12/05/2021  6:37 PM EDT ----- Patient had a recent home sleep test for reevaluation of her sleep apnea.  She has been on AutoPap therapy but has had residual central events.  She is also lost weight over time.  Please advise her that her recent home sleep test from 12/03/2021 indicated central sleep apneas which are unlikely to be resolved with AutoPap therapy.  I recommend that we have her come in for a proper titration study in the sleep lab.   She may need a different machine such as BiPAP.  I have ordered a sleep study for titration with BiPAP, I will copy Meagan.  Star Age, MD, PhD [facility])

## 2021-12-09 NOTE — Telephone Encounter (Signed)
I called pt. I advised pt that Dr. Rexene Alberts reviewed their sleep study results and found that sleep apnea was present with central apnea noted. and recommends that pt be treated with a cpap. Dr. Rexene Alberts recommends that pt return for a repeat sleep study in order to properly titrate the machine (may need BiPAP therapy)  and ensure a good mask fit. Pt is agreeable to returning for a titration study. I advised pt that our sleep lab will file with pt's insurance and call pt to schedule the sleep study when we hear back from the pt's insurance regarding coverage of this sleep study. Pt verbalized understanding of results. Pt had no questions at this time but was encouraged to call back if questions arise.

## 2021-12-12 NOTE — Progress Notes (Signed)
Carelink Summary Report / Loop Recorder 

## 2021-12-16 ENCOUNTER — Ambulatory Visit: Payer: Medicare HMO | Admitting: Physician Assistant

## 2021-12-19 ENCOUNTER — Telehealth: Payer: Self-pay

## 2021-12-19 NOTE — Telephone Encounter (Signed)
Patient daughter [name] called back to schedule patient appt, I called her back and left her a voicemail to call me back to schedule.  Apolonio Schneiders: [id number] (exp. 12/11/21 to 01/10/22).

## 2021-12-19 NOTE — Telephone Encounter (Signed)
Pt will call back after she speaks with her daughter to see when she can bring her for study.

## 2021-12-20 ENCOUNTER — Other Ambulatory Visit: Payer: Self-pay | Admitting: Family Medicine

## 2021-12-31 NOTE — Telephone Encounter (Signed)
LVM with daughter-[name]--for her mom, [place], to call back to schedule sleep study.

## 2022-01-01 NOTE — Telephone Encounter (Signed)
Left voicemail with patient daughter [name] to call back to schedule.

## 2022-01-06 NOTE — Telephone Encounter (Signed)
I spoke with patient daughter [name] the patient is scheduled at [facility] on 01/26/22 at 9 pm.

## 2022-01-07 NOTE — Telephone Encounter (Signed)
Updated [place]: [id number] (exp. 01/24/22 to 02/23/22).  Patient is scheduled at [facility] for 01/26/22.

## 2022-01-08 ENCOUNTER — Encounter: Payer: Self-pay | Admitting: Physician Assistant

## 2022-01-08 ENCOUNTER — Other Ambulatory Visit: Payer: Medicare HMO

## 2022-01-08 ENCOUNTER — Ambulatory Visit: Payer: Medicare HMO | Admitting: Physician Assistant

## 2022-01-08 VITALS — BP 134/86 | HR 60 | Ht 60.0 in | Wt 119.0 lb

## 2022-01-08 DIAGNOSIS — R197 Diarrhea, unspecified: Secondary | ICD-10-CM | POA: Diagnosis not present

## 2022-01-08 NOTE — Progress Notes (Signed)
Chief Complaint: Diarrhea  HPI:    [name] [name] is a 81 year old Caucasian female with a past medical history of IBS and others listed below, previously known to Dr. Henrene Pastor, who presents to clinic today with a complaint of diarrhea.    12/18/2014 colonoscopy with Dr. Vira Agar with 1 diminutive polyp in the sigmoid colon and internal hemorrhoids and otherwise normal.  Pathology showed hyperplastic polyp.    05/02/2021 CT the abdomen pelvis without contrast showed a fat-containing midline epigastric hernia underlying palpable marker measuring 3.9 cm with hernia neck measuring 2.3 cm, status postcholecystectomy.    11/08/2021 patient seen in clinic by her PCP and discussed diarrhea for several months.  Describe occasional hard stools but mostly and it being diarrhea every couple of days.  That time discussed adding fiber.  CBC and CMP normal that day.    Today, patient presents to clinic accompanied by her daughter who does assist with history.  Apparently over the past 4 to 5 months she has been having sudden urgent episodes of watery diarrhea which occur 1-2 times a week and are inhibiting her from going anywhere because she is never sure when they are going to come.  Her daughter tried Lactaid thinking it may be dairy but she continued to have episodes.  She will use Imodium after an episode and this seems to stop her stool for a few days.  Also describes that very occasionally she will have a formed stool.  Does tell me she has had some incontinence due to the urgency.  No abdominal pain or blood in her stool.  No new medications.     Denies fever, chills, weight loss or blood in her stool.  Past Medical History:  Diagnosis Date   Arthritis    knees   Depression    Fatty liver    GERD (gastroesophageal reflux disease)    Hyperlipidemia    Hypertension    IBS (irritable bowel syndrome)    Obesity    Pneumonia    Sleep apnea    uses CPAP   Stroke (cerebrum) [facility])     Past Surgical History:   Procedure Laterality Date   BLADDER SURGERY     BREAST SURGERY     breast biopsy   BROW LIFT Bilateral 04/14/2017   Procedure: BLEPHAROPLASTY UPPER EYELID WITH EXCESS SKIN;  Surgeon: Karle Starch, MD;  Location: [facility];  Service: Ophthalmology;  Laterality: Bilateral;   CATARACT EXTRACTION W/PHACO Left 02/05/2016   Procedure: CATARACT EXTRACTION PHACO AND INTRAOCULAR LENS PLACEMENT ([place]);  Surgeon: Birder Robson, MD;  Location: [facility];  Service: Ophthalmology;  Laterality: Left;  [place] 01:10 AP% 22.3 CDE 15.67 Fluid pack lot # [id number]   CATARACT EXTRACTION W/PHACO Right 02/26/2016   Procedure: CATARACT EXTRACTION PHACO AND INTRAOCULAR LENS PLACEMENT ([facility]);  Surgeon: Birder Robson, MD;  Location: [facility];  Service: Ophthalmology;  Laterality: Right;  [place] 57.4 AP% 24.0 CDE 13.75 Fluid Pack lot # [id number]   CHOLECYSTECTOMY     COLONOSCOPY WITH PROPOFOL N/A 12/18/2014   Procedure: COLONOSCOPY WITH PROPOFOL;  Surgeon: Manya Silvas, MD;  Location: [facility];  Service: Endoscopy;  Laterality: N/A;   DEEP NECK LYMPH NODE BIOPSY / EXCISION     implantable loop recorder implantation  06/20/2020   Medtronic Reveal Linq model LNQ 22 ([place] [id number]) implantable loop recorder for cryptogenic stroke   JOINT REPLACEMENT     KNEE ARTHROPLASTY Left 06/20/2015   Procedure: COMPUTER ASSISTED TOTAL KNEE ARTHROPLASTY;  Surgeon: Jeneen Rinks  Vira Blanco, MD;  Location: [facility];  Service: Orthopedics;  Laterality: Left;   PTOSIS REPAIR Bilateral 04/14/2017   Procedure: PTOSIS REPAIR RESECT EX;  Surgeon: Karle Starch, MD;  Location: [place];  Service: Ophthalmology;  Laterality: Bilateral;  sleep apnea   TONSILLECTOMY     TOTAL HIP ARTHROPLASTY Right 07/21/2018   Procedure: TOTAL HIP ARTHROPLASTY ANTERIOR APPROACH;  Surgeon: Gaynelle Arabian, MD;  Location: [facility];  Service: Orthopedics;  Laterality: Right;    Current Outpatient Medications  Medication Sig Dispense Refill    atenolol (TENORMIN) 25 MG tablet TAKE 1 TABLET BY MOUTH 2 TIMES DAILY. 180 tablet 1   atorvastatin (LIPITOR) 40 MG tablet Take 1 tablet (40 mg total) by mouth daily. 90 tablet 3   Cholecalciferol (VITAMIN D3) 25 MCG (1000 UT) CAPS Take 1 capsule (1,000 Units total) by mouth daily.     donepezil (ARICEPT) 5 MG tablet TAKE 1 TABLET BY MOUTH DAILY. 90 tablet 1   OLANZapine (ZYPREXA) 10 MG tablet Take 1 tablet (10 mg total) by mouth at bedtime. 30 tablet 3   polyethylene glycol powder (GLYCOLAX/MIRALAX) 17 GM/SCOOP powder Take 17 g by mouth daily as needed for mild constipation.     sertraline (ZOLOFT) 100 MG tablet Take 2 tablets (200 mg total) by mouth daily. 60 tablet 3   thiamine 100 MG tablet Take 1 tablet (100 mg total) by mouth daily. 90 tablet 1   ticagrelor (BRILINTA) 90 MG TABS tablet Take 1 tablet (90 mg total) by mouth 2 (two) times daily. Via AZ&Me pt assistance 180 tablet 0   No current facility-administered medications for this visit.    Allergies as of 01/08/2022 - Review Complete 11/08/2021  Allergen Reaction Noted   Hydrocodone Nausea Only 11/29/2014    Family History  Problem Relation Age of Onset   Heart attack Mother    Dementia Father    Heart attack Father 58   Aortic aneurysm Brother    Stroke Paternal Grandfather    Colon cancer Neg Hx    Breast cancer Neg Hx     Social History   Socioeconomic History   Marital status: Widowed    Spouse name: Not on file   Number of children: Not on file   Years of education: Not on file   Highest education level: Not on file  Occupational History   Occupation: retired  Tobacco Use   Smoking status: Never   Smokeless tobacco: Never  Vaping Use   Vaping Use: Never used  Substance and Sexual Activity   Alcohol use: Not Currently    Comment: ocassional glass of wine 2-3 times a year   Drug use: No   Sexual activity: Never  Other Topics Concern   Not on file  Social History Narrative   Lives alone in home   Right  Handed   Drinks 1-2 cups caffeine daily   Social Determinants of Health   Financial Resource Strain: Low Risk  (07/17/2021)   Overall Financial Resource Strain (CARDIA)    Difficulty of Paying Living Expenses: Not very hard  Food Insecurity: No Food Insecurity (04/30/2021)   Hunger Vital Sign    Worried About Running Out of Food in the Last Year: Never true    Ran Out of Food in the Last Year: Never true  Transportation Needs: No Transportation Needs (04/30/2021)   PRAPARE - Hydrologist (Medical): No    Lack of Transportation (Non-Medical): No  Physical Activity:  Insufficiently Active (04/30/2021)   Exercise Vital Sign    Days of Exercise per Week: 3 days    Minutes of Exercise per Session: 10 min  Stress: No Stress Concern Present (04/30/2021)   [place]    Feeling of Stress : Not at all  Social Connections: Socially Isolated (04/30/2021)   Social Connection and Isolation Panel [NHANES]    Frequency of Communication with Friends and Family: More than three times a week    Frequency of Social Gatherings with Friends and Family: More than three times a week    Attends Religious Services: Never    Marine scientist or Organizations: No    Attends Archivist Meetings: Never    Marital Status: Widowed  Intimate Partner Violence: Not At Risk (04/30/2021)   Humiliation, Afraid, Rape, and Kick questionnaire    Fear of Current or Ex-Partner: No    Emotionally Abused: No    Physically Abused: No    Sexually Abused: No    Review of Systems:    Constitutional: No weight loss, fever or chills Skin: No rash  Cardiovascular: No chest pain Respiratory: No SOB Gastrointestinal: See HPI and otherwise negative Genitourinary: No dysuria Neurological: No headache, dizziness or syncope Musculoskeletal: No new muscle or joint pain Hematologic: No bleeding  Psychiatric: No history of  depression or anxiety   Physical Exam:  Vital signs: BP 134/86 (BP Location: Right Arm, Patient Position: Sitting)   Pulse 60   Ht 5' (1.524 m)   Wt 119 lb (54 kg)   SpO2 96%   BMI 23.24 kg/m    Constitutional:   Pleasant Elderly Caucasian female appears to be in NAD, Well developed, Well nourished, alert and cooperative Head:  Normocephalic and atraumatic. Eyes:   PEERL, EOMI. No icterus. Conjunctiva pink. Ears:  Normal auditory acuity. Neck:  Supple Throat: Oral cavity and pharynx without inflammation, swelling or lesion.  Respiratory: Respirations even and unlabored. Lungs clear to auscultation bilaterally.   No wheezes, crackles, or rhonchi.  Cardiovascular: Normal S1, S2. No MRG. Regular rate and rhythm. No peripheral edema, cyanosis or pallor.  Gastrointestinal:  Soft, nondistended, nontender. No rebound or guarding. Normal bowel sounds. No appreciable masses or hepatomegaly. Rectal:  Not performed.  Msk:  Symmetrical without gross deformities. Without edema, no deformity or joint abnormality.  Neurologic:  Alert and  oriented x4;  grossly normal neurologically.  Skin:   Dry and intact without significant lesions or rashes. Psychiatric: Demonstrates good judgement and reason without abnormal affect or behaviors.  RELEVANT LABS AND IMAGING: CBC    Component Value Date/Time   WBC 6.9 11/08/2021 1515   RBC 4.77 11/08/2021 1515   HGB 14.7 11/08/2021 1515   HGB 15.7 05/31/2015 1136   HCT 42.5 11/08/2021 1515   HCT 45.0 05/31/2015 1136   PLT 233 11/08/2021 1515   PLT 280 05/31/2015 1136   MCV 89.1 11/08/2021 1515   MCV 89 05/31/2015 1136   MCV 91 11/26/2012 1630   MCH 30.8 11/08/2021 1515   MCHC 34.6 11/08/2021 1515   RDW 12.2 11/08/2021 1515   RDW 12.6 05/31/2015 1136   RDW 12.8 11/26/2012 1630   LYMPHSABS 1,297 11/08/2021 1515   LYMPHSABS 2.0 05/31/2015 1136   MONOABS 0.8 10/26/2020 2151   EOSABS 110 11/08/2021 1515   EOSABS 0.2 05/31/2015 1136   BASOSABS 48  11/08/2021 1515   BASOSABS 0.1 05/31/2015 1136    CMP  Component Value Date/Time   NA 142 11/08/2021 1515   NA 141 05/31/2015 1136   NA 143 11/26/2012 1630   K 4.1 11/08/2021 1515   K 4.3 11/26/2012 1630   CL 107 11/08/2021 1515   CL 110 (H) 11/26/2012 1630   CO2 23 11/08/2021 1515   CO2 27 11/26/2012 1630   GLUCOSE 84 11/08/2021 1515   GLUCOSE 97 11/26/2012 1630   BUN 12 11/08/2021 1515   BUN 15 05/31/2015 1136   BUN 14 11/26/2012 1630   CREATININE 0.89 11/08/2021 1515   CALCIUM 10.1 11/08/2021 1515   CALCIUM 9.1 11/26/2012 1630   PROT 6.2 11/08/2021 1515   PROT 6.5 05/31/2015 1136   PROT 6.7 11/26/2012 1630   ALBUMIN 4.3 10/26/2020 2152   ALBUMIN 4.2 05/31/2015 1136   ALBUMIN 3.7 11/26/2012 1630   AST 17 11/08/2021 1515   AST 33 11/26/2012 1630   ALT 10 11/08/2021 1515   ALT 27 11/26/2012 1630   ALKPHOS 146 (H) 10/26/2020 2152   ALKPHOS 96 11/26/2012 1630   BILITOT 0.5 11/08/2021 1515   BILITOT 0.4 05/31/2015 1136   BILITOT 0.3 11/26/2012 1630   GFRNONAA >60 10/26/2020 2152   GFRNONAA >60 11/26/2012 1630   GFRAA >60 02/18/2020 0723   GFRAA >60 11/26/2012 1630    Assessment: 1.  Change in bowel habits: Towards loose urgent stools 1-2 times a week, some occasional solid stools maybe once or twice a month, no help from Lactaid, Imodium does seem to stop things up, no change in medicines or diet, last colonoscopy in 2016 was normal other than 1 polyp, no abdominal pain; consider bile salt induced diarrhea versus infectious or inflammatory cause  Plan: 1.  Ordered stool studies including a GI pathogen panel, fecal calprotectin, fecal lactoferrin and fecal pancreatic elastase as well as O&P.  Pending results from these we will discuss other options. 2.  If above is negative/normal could do a trial of Cholestyramine 4 g packet once daily. 3.  Patient would like to avoid another colonoscopy if possible, we will try other therapies prior to proceeding with this. 4.   Patient wishes to be reestablish with Dr. Henrene Pastor.  He had previously okayed this. 5.  Patient to follow in clinic with me as recommended after stool studies.  Ellouise Newer, PA-C [facility] 01/08/2022, 11:08 AM

## 2022-01-08 NOTE — Progress Notes (Signed)
Noted  

## 2022-01-08 NOTE — Patient Instructions (Signed)
Your provider has requested that you go to the basement level for lab work before leaving today. Press "B" on the elevator. The lab is located at the first door on the left as you exit the elevator.  _______________________________________________________  If you are age 79 or older, your body mass index should be between 23-30. Your Body mass index is 23.24 kg/m. If this is out of the aforementioned range listed, please consider follow up with your Primary Care Provider.  If you are age 52 or younger, your body mass index should be between 19-25. Your Body mass index is 23.24 kg/m. If this is out of the aformentioned range listed, please consider follow up with your Primary Care Provider.   ________________________________________________________  The [facility] providers would like to encourage you to use [facility] to communicate with providers for non-urgent requests or questions.  Due to long hold times on the telephone, sending your provider a message by [facility] may be a faster and more efficient way to get a response.  Please allow 48 business hours for a response.  Please remember that this is for non-urgent requests.  _______________________________________________________

## 2022-01-10 ENCOUNTER — Telehealth: Payer: Self-pay

## 2022-01-10 NOTE — Chronic Care Management (AMB) (Signed)
    Chronic Care Management Pharmacy Assistant   Name: [name] [name]  MRN: [id number] DOB: August 28, 1940  Reason for Encounter: Reminder Call    Medications: Outpatient Encounter Medications as of 01/10/2022  Medication Sig   atenolol (TENORMIN) 25 MG tablet TAKE 1 TABLET BY MOUTH 2 TIMES DAILY.   atorvastatin (LIPITOR) 40 MG tablet Take 1 tablet (40 mg total) by mouth daily.   Cholecalciferol (VITAMIN D3) 25 MCG (1000 UT) CAPS Take 1 capsule (1,000 Units total) by mouth daily.   donepezil (ARICEPT) 5 MG tablet TAKE 1 TABLET BY MOUTH DAILY.   OLANZapine (ZYPREXA) 10 MG tablet Take 1 tablet (10 mg total) by mouth at bedtime.   polyethylene glycol powder (GLYCOLAX/MIRALAX) 17 GM/SCOOP powder Take 17 g by mouth daily as needed for mild constipation.   sertraline (ZOLOFT) 100 MG tablet Take 2 tablets (200 mg total) by mouth daily.   thiamine 100 MG tablet Take 1 tablet (100 mg total) by mouth daily.   ticagrelor (BRILINTA) 90 MG TABS tablet Take 1 tablet (90 mg total) by mouth 2 (two) times daily. Via AZ&Me pt assistance   No facility-administered encounter medications on file as of 01/10/2022.  [name] [name] was contacted to remind of upcoming telephone visit with [name] on 01/15/22 at 3:00pm. Patient was reminded to have any blood glucose and blood pressure readings available for review at appointment.   Message was left reminding patient of appointment.   CCM referral has been placed prior to visit?  Yes   Star Rating Drugs: Medication:  Last Fill: Day Supply Atorvastatin '40mg'$  11/20/21 [place], CPP notified  [name], [place]  [phone]

## 2022-01-14 ENCOUNTER — Telehealth: Payer: Self-pay

## 2022-01-14 NOTE — Progress Notes (Signed)
    Chronic Care Management Pharmacy Assistant   Name: [name] [name]  MRN: [id number] DOB: July 30, 1940   Reason for Encounter: CCM (Reschedule Appointment)  Called patient to reschedule her appointment on September 1 made through My Chart as [name] has meetings. I spoke with patient and she stated her daughter, [name] [name], made the appointment and asked me to call her. Called patient's daughter; no answer; left a message.    [name], CPP notified  [name], [place] Clinical Pharmacy Assistant [phone]

## 2022-01-15 ENCOUNTER — Telehealth: Payer: Medicare HMO

## 2022-01-21 ENCOUNTER — Telehealth: Payer: Self-pay

## 2022-01-21 NOTE — Chronic Care Management (AMB) (Unsigned)
    Chronic Care Management Pharmacy Assistant   Name: [name] [name]  MRN: [id number] DOB: 1940/07/29  Reason for Encounter: Reminder Call  Hospital visits:  None in previous 6 months  Medications: Outpatient Encounter Medications as of 01/21/2022  Medication Sig   atenolol (TENORMIN) 25 MG tablet TAKE 1 TABLET BY MOUTH 2 TIMES DAILY.   atorvastatin (LIPITOR) 40 MG tablet Take 1 tablet (40 mg total) by mouth daily.   Cholecalciferol (VITAMIN D3) 25 MCG (1000 UT) CAPS Take 1 capsule (1,000 Units total) by mouth daily.   donepezil (ARICEPT) 5 MG tablet TAKE 1 TABLET BY MOUTH DAILY.   OLANZapine (ZYPREXA) 10 MG tablet Take 1 tablet (10 mg total) by mouth at bedtime.   polyethylene glycol powder (GLYCOLAX/MIRALAX) 17 GM/SCOOP powder Take 17 g by mouth daily as needed for mild constipation.   sertraline (ZOLOFT) 100 MG tablet Take 2 tablets (200 mg total) by mouth daily.   thiamine 100 MG tablet Take 1 tablet (100 mg total) by mouth daily.   ticagrelor (BRILINTA) 90 MG TABS tablet Take 1 tablet (90 mg total) by mouth 2 (two) times daily. Via AZ&Me pt assistance   No facility-administered encounter medications on file as of 01/21/2022.   [name] [name] was contacted to remind of upcoming telephone visit with [name] on 01/31/22 at 11:45am. Patient was reminded to have any blood glucose and blood pressure readings available for review at appointment.   {Appointment confirmation:27274}  Are you having any problems with your medications? {yes/no:20286}   Do you have any concerns you like to discuss with the pharmacist? {yes/no:20286}  CCM referral has been placed prior to visit?  Yes   Star Rating Drugs: Medication:  Last Fill: Day Supply

## 2022-01-22 ENCOUNTER — Other Ambulatory Visit: Payer: Self-pay | Admitting: Family Medicine

## 2022-01-24 ENCOUNTER — Other Ambulatory Visit: Payer: Medicare HMO

## 2022-01-24 ENCOUNTER — Telehealth: Payer: Medicare HMO

## 2022-01-24 DIAGNOSIS — R197 Diarrhea, unspecified: Secondary | ICD-10-CM | POA: Diagnosis not present

## 2022-01-26 ENCOUNTER — Ambulatory Visit (INDEPENDENT_AMBULATORY_CARE_PROVIDER_SITE_OTHER): Payer: Medicare HMO | Admitting: Neurology

## 2022-01-26 DIAGNOSIS — Z9989 Dependence on other enabling machines and devices: Secondary | ICD-10-CM | POA: Diagnosis not present

## 2022-01-26 DIAGNOSIS — Z8673 Personal history of transient ischemic attack (TIA), and cerebral infarction without residual deficits: Secondary | ICD-10-CM

## 2022-01-26 DIAGNOSIS — G4733 Obstructive sleep apnea (adult) (pediatric): Secondary | ICD-10-CM | POA: Diagnosis not present

## 2022-01-26 DIAGNOSIS — G4731 Primary central sleep apnea: Secondary | ICD-10-CM

## 2022-01-26 DIAGNOSIS — Z789 Other specified health status: Secondary | ICD-10-CM

## 2022-01-26 DIAGNOSIS — R634 Abnormal weight loss: Secondary | ICD-10-CM

## 2022-01-26 LAB — GI PROFILE, STOOL, PCR

## 2022-01-28 LAB — FECAL LACTOFERRIN, QUANT
Fecal Lactoferrin: NEGATIVE
MICRO NUMBER:: 13864630
SPECIMEN QUALITY:: ADEQUATE

## 2022-01-30 LAB — CALPROTECTIN, FECAL: Calprotectin, Fecal: 6 ug/g (ref 0–120)

## 2022-01-31 ENCOUNTER — Telehealth: Payer: Medicare HMO

## 2022-01-31 ENCOUNTER — Ambulatory Visit (INDEPENDENT_AMBULATORY_CARE_PROVIDER_SITE_OTHER): Payer: Medicare HMO | Admitting: Pharmacist

## 2022-01-31 DIAGNOSIS — E7800 Pure hypercholesterolemia, unspecified: Secondary | ICD-10-CM

## 2022-01-31 DIAGNOSIS — F411 Generalized anxiety disorder: Secondary | ICD-10-CM

## 2022-01-31 DIAGNOSIS — I10 Essential (primary) hypertension: Secondary | ICD-10-CM

## 2022-01-31 DIAGNOSIS — Z8673 Personal history of transient ischemic attack (TIA), and cerebral infarction without residual deficits: Secondary | ICD-10-CM

## 2022-01-31 LAB — OVA AND PARASITE EXAMINATION
CONCENTRATE RESULT:: NONE SEEN
MICRO NUMBER:: 13864384
SPECIMEN QUALITY:: ADEQUATE
TRICHROME RESULT:: NONE SEEN

## 2022-01-31 NOTE — Progress Notes (Signed)
Chronic Care Management Pharmacy Note  01/31/2022 Name:  [name] [name] MRN:  [id number] DOB:  11-09-1940  Summary: CCM F/U visit -Patient and daughter [name] [name] Level endorse compliance with medications - daughter sets up pill box each week -Pt reports Brilinta has been shipped regularly from [employer]; they will need to renew PAP before Jan 2024  Recommendations/Changes made from today's visit: -No med changes  Plan: -Transition CCM to Self Care: Patient achieved CCM goals and no longer needs to be contacted as frequently. The patient has been provided with contact information for the care management team and has been advised to call with any health related questions or concerns.     Subjective: [name] [name] is an 81 y.o. year old female who is a primary patient of Damita Dunnings, Elveria Rising, MD.  The CCM team was consulted for assistance with disease management and care coordination needs.    Engaged with patient by telephone for follow up visit in response to provider referral for pharmacy case management and/or care coordination services.   Consent to Services:  The patient was given information about Chronic Care Management services, agreed to services, and gave verbal consent prior to initiation of services.  Please see initial visit note for detailed documentation.   Patient Care Team: Tonia Ghent, MD as PCP - General (Family Medicine) Birder Robson, MD as Referring Physician (Ophthalmology) Marry Guan Laurice Record, MD as Consulting Physician (Orthopedic Surgery) Garvin Fila, MD as Consulting Physician (Neurology) Charlton Haws, [facility] as Pharmacist (Pharmacist)  Recent office visits: 11/08/21 Dr Damita Dunnings OV: diarrhea x months. Discussed fiber - try metamucil. Limit artificial sweeteners.  04/01/21-[name] MSW,LCSW- CCM social work encounter 01/24/21-PCP-[name],MD-Patient presented for hospital follow up.Reasonable to continue her baseline medications, use MiraLAX as needed  and add on vitamin D.  Recent consult visits: 01/08/22 PA Ellouise Newer (GI): diarrhea - ordered stool studies. Trial cholestyramine if normal.  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 Needs titration study done, may need BiPAP. 10/29/21 NP Lesle Chris [facility]): increase sertraline to 200 mg daily. Contiue zyprexa 10 mg. 10/24/21 Dr Rexene Alberts (Neurology): OSA - ordered home sleep study and CPAP.  09/03/21 Behavioral health TE - stop melatonin and reduce Zyprexa by 1/2 to 5 mg.  08/06/21 NP Frann Rider (Neurology): hx strokes. LDL 66. BP goal < 130/90. Referral for home health for gait. No med changes.  06/06/21-Cardiology-remote pacemaker check(monthly check) 04/16/21-General Surgery-[name],MD-Patient presented for follow up hernia surgery.recommended that she begin to take MiraLAX -1/2 dose daily, adjust to effect. CT-scan ordered 04/16/21-Behavioral Health-NP Lesle Chris: f/u Paranoia, MDD; pt feeling well, denies paranoid thoughts. No med changes. 03/06/21-Home Health Services-no data found- weekly visits 02/04/21-Neurology-[name],NP- f/u cognitive impairment, anxiety, stroke. LDL 88, advised to resume atorvastatin 40 mg daily.  Hospital visits: None in previous 6 months   Objective:  Lab Results  Component Value Date   CREATININE 0.89 11/08/2021   BUN 12 11/08/2021   GFR 59.88 (L) 02/23/2020   GFRNONAA >60 10/26/2020   GFRAA >60 02/18/2020   NA 142 11/08/2021   K 4.1 11/08/2021   CALCIUM 10.1 11/08/2021   CO2 23 11/08/2021   GLUCOSE 84 11/08/2021    Lab Results  Component Value Date/Time   HGBA1C 5.3 04/19/2020 09:33 AM   HGBA1C 5.6 02/17/2020 04:26 AM   GFR 59.88 (L) 02/23/2020 01:30 PM   GFR 63.97 10/13/2019 08:12 AM    Last diabetic Eye exam: No results found for: "HMDIABEYEEXA"  Last diabetic Foot  exam: No results found for: "HMDIABFOOTEX"   Lab Results  Component Value Date   CHOL 139 08/06/2021   HDL 49 08/06/2021   LDLCALC 66 08/06/2021    LDLDIRECT 160.0 12/10/2018   TRIG 135 08/06/2021   CHOLHDL 2.8 08/06/2021       Latest Ref Rng & Units 11/08/2021    3:15 PM 10/26/2020    9:52 PM 08/04/2020    6:20 PM  Hepatic Function  Total Protein 6.1 - 8.1 g/dL 6.2  7.1  6.9   Albumin 3.5 - 5.0 g/dL  4.3  4.2   AST 10 - 35 U/L _0 ALT 6 - 29 U/L _1 Alk Phosphatase 38 - 126 U/L  146  121   Total Bilirubin 0.2 - 1.2 mg/dL 0.5  1.1  1.0     Lab Results  Component Value Date/Time   TSH 1.400 11/05/2020 01:16 PM   TSH 1.235 04/19/2020 09:33 AM   TSH 1.480 05/31/2015 11:36 AM       Latest Ref Rng & Units 11/08/2021    3:15 PM 10/26/2020    9:51 PM 08/04/2020    6:20 PM  CBC  WBC 3.8 - 10.8 Thousand/uL 6.9  9.6  6.9   Hemoglobin 11.7 - 15.5 g/dL 14.7  16.1  14.6   Hematocrit 35.0 - 45.0 % 42.5  48.8  44.6   Platelets 140 - 400 Thousand/uL 233  305  278     No results found for: "VD25OH"  Clinical ASCVD: Yes  The ASCVD Risk score (Arnett DK, et al., 2019) failed to calculate for the following reasons:   The patient has a prior MI or stroke diagnosis       04/30/2021    9:53 AM 01/24/2021   12:09 PM 07/26/2019   11:44 AM  Depression screen PHQ 2/9  Decreased Interest 0 3 0  Down, Depressed, Hopeless 0 3 1  PHQ - 2 Score 0 6 1  Altered sleeping  3   Tired, decreased energy  0   Change in appetite  3   Feeling bad or failure about yourself   2   Trouble concentrating  0   Moving slowly or fidgety/restless  3   Suicidal thoughts  3   PHQ-9 Score  20   Difficult doing work/chores  Not difficult at all        01/24/2021   12:09 PM 09/12/2020    9:55 AM  GAD 7 : Generalized Anxiety Score  Nervous, Anxious, on Edge 3 3  Control/stop worrying 3 3  Worry too much - different things 3 3  Trouble relaxing 3 3  Restless  3  Easily annoyed or irritable 0 1  Afraid - awful might happen  3  Total GAD 7 Score  19  Anxiety Difficulty Not difficult at all     Social History   Tobacco Use  Smoking Status  Never  Smokeless Tobacco Never   BP Readings from Last 3 Encounters:  01/08/22 134/86  11/08/21 120/70  10/24/21 135/81   Pulse Readings from Last 3 Encounters:  01/08/22 60  11/08/21 (!) 58  10/24/21 70   Wt Readings from Last 3 Encounters:  01/08/22 119 lb (54 kg)  11/08/21 123 lb (55.8 kg)  10/24/21 124 lb 9.6 oz (56.5 kg)   BMI Readings from Last 3 Encounters:  01/08/22 23.24 kg/m  11/08/21 24.02 kg/m  10/24/21 24.33 kg/m  Assessment/Interventions: Review of patient past medical history, allergies, medications, health status, including review of consultants reports, laboratory and other test data, was performed as part of comprehensive evaluation and provision of chronic care management services.   SDOH:  (Social Determinants of Health) assessments and interventions performed: No - done 06/2021 SDOH Interventions    Flowsheet Row Chronic Care Management from 07/17/2021 in [place] at [place] Management from 02/25/2021 in [place] at [place] Interventions    Transportation Interventions -- Intervention Not Indicated  Financial Strain Interventions Other (Comment)  [enrolld in [employer] for Brilinta] --      [place]: No Food Insecurity (04/30/2021)  Housing: Low Risk  (04/30/2021)  Transportation Needs: No Transportation Needs (04/30/2021)  Alcohol Screen: Low Risk  (04/30/2021)  Depression (PHQ2-9): Low Risk  (04/30/2021)  Financial Resource Strain: Low Risk  (07/17/2021)  Physical Activity: Insufficiently Active (04/30/2021)  Social Connections: Socially Isolated (04/30/2021)  Stress: No Stress Concern Present (04/30/2021)  Tobacco Use: Low Risk  (01/08/2022)    [place]  Allergies  Allergen Reactions   Hydrocodone Nausea Only    Noted after surgery, may be able to tolerate with food    Medications Reviewed Today     Reviewed by Charlton Haws, [facility] (Pharmacist) on 01/31/22 at 1205   Med List Status: <None>   Medication Order Taking? Sig Documenting Provider Last Dose Status Informant  atenolol (TENORMIN) 25 MG tablet [id number] Yes TAKE 1 TABLET BY MOUTH 2 TIMES DAILY. Tonia Ghent, MD Taking Active   atorvastatin (LIPITOR) 40 MG tablet [id number] Yes Take 1 tablet (40 mg total) by mouth daily. Frann Rider, NP Taking Active   Cholecalciferol (VITAMIN D3) 25 MCG (1000 UT) CAPS [id number] Yes Take 1 capsule (1,000 Units total) by mouth daily. Tonia Ghent, MD Taking Active   donepezil (ARICEPT) 5 MG tablet [id number] Yes TAKE 1 TABLET BY MOUTH DAILY. Tonia Ghent, MD Taking Active   OLANZapine [facility]) 10 MG tablet [id number] Yes Take 1 tablet (10 mg total) by mouth at bedtime. Elwanda Brooklyn, NP Taking Active   sertraline (ZOLOFT) 100 MG tablet [id number] Yes Take 2 tablets (200 mg total) by mouth daily. Elwanda Brooklyn, NP Taking Active   thiamine 100 MG tablet [id number] Yes Take 1 tablet (100 mg total) by mouth daily. Tonia Ghent, MD Taking Active   ticagrelor [facility]) 90 MG TABS tablet [id number] Yes Take 1 tablet (90 mg total) by mouth 2 (two) times daily. Via [employer] pt assistance Tonia Ghent, MD Taking Active             Patient Active Problem List   Diagnosis Date Noted   Major neurocognitive disorder ([place]) 01/14/2021   Cognitive decline 12/12/2020   GAD (generalized anxiety disorder) 12/12/2020   Adjustment disorder with mixed anxiety and depressed mood 10/27/2020   Suicidal ideations 10/27/2020   Shingles 08/30/2020   Acute CVA (cerebrovascular accident) ([place]) 04/18/2020   Seasonal and perennial allergic rhinitis 03/28/2020   HLD (hyperlipidemia) 02/16/2020   Stroke ([place]) 02/16/2020   Fall 02/16/2020   Abdominal wall hernia 10/30/2019   Change in voice 10/30/2019   History of stroke 09/14/2019   Spinal stenosis 07/24/2019   Mixed hyperlipidemia    OSA (obstructive sleep apnea)    Dysfunction of eustachian tube 05/15/2019   Other  social stressor 03/29/2019   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   Diarrhea 06/20/2018   Joint pain  06/20/2018   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   Medicare annual wellness visit, subsequent 09/15/2017   SUI (stress urinary incontinence, female) 08/07/2017   Back pain 06/16/2016   Advance care planning 01/07/2016   Sleep apnea    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   Allergic rhinitis 11/24/2014   Adult BMI 30+ 11/24/2014   Depression 11/24/2014   Insomnia 08/23/2009   Headache, migraine 05/10/2009   HERPES SIMPLEX INFECTION 08/14/2006   HYPERCHOLESTEROLEMIA 08/14/2006   Anxiety state 08/14/2006   Essential hypertension 08/14/2006   HIATAL HERNIA 08/14/2006   IRRITABLE BOWEL SYNDROME 08/14/2006   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   Primary osteoarthritis of right knee 08/14/2006   PLANTAR FASCIITIS 08/14/2006    Immunization History  Administered Date(s) Administered   Influenza Inj Mdck Quad Pf 03/16/2018   Influenza Whole 05/09/2004   Influenza, High Dose Seasonal PF 04/01/2017, 03/23/2019   Influenza, Seasonal, Injecte, Preservative Fre 03/09/2016   Influenza,inj,Quad PF,6+ Mos 02/06/2014   Influenza,inj,quad, With Preservative 03/31/2017   Influenza-Unspecified 01/24/2014, 03/16/2018, 03/23/2019   PFIZER(Purple Top)SARS-COV-2 Vaccination 06/06/2019, 06/25/2019   PPD Test 01/09/2021   Pneumococcal Conjugate-13 04/25/2014   Pneumococcal Polysaccharide-23 03/30/2007   Pneumococcal-Unspecified 09/17/2007   Td 10/24/2005   Tdap 01/18/2016   Zoster, Live 05/26/2008    Conditions to be addressed/monitored:  Hypertension, Hyperlipidemia, Depression, and Anxiety  Care Plan : [place]  Updates made by Charlton Haws, [place] since 01/31/2022 12:00 AM     Problem: Hypertension, Hyperlipidemia, Depression, and Anxiety   Priority: High     Long-Range Goal: Disease mgmt   Start Date: 07/19/2021   Expected End Date: 01/31/2022  This Visit's Progress: On track  Recent Progress: On track  Priority: High  Note:   Current Barriers:  None identified  Pharmacist Clinical Goal(s):  Patient will contact provider office for questions/concerns as evidenced notation of same in electronic health record through collaboration with PharmD and provider.   Interventions: 1:1 collaboration with Tonia Ghent, MD regarding development and update of comprehensive plan of care as evidenced by provider attestation and co-signature Inter-disciplinary care team collaboration (see longitudinal plan of care) Comprehensive medication review performed; medication list updated in electronic medical record  Hypertension (BP goal <130/80) -Controlled - BP is at/near goal in office; pt is not checking at home; she does endorse occasional dizziness, she uses walker or cane to get around at all times -Hx strokes; OSA -Current home BP readings: not checking -Current treatment: Atenolol 25 mg BID - Query Appropriate, Effective, Safe, Accessible -Medications previously tried: metoprolol  -Educated on BP goals and benefits of medications for prevention of heart attack, stroke and kidney damage; Importance of home blood pressure monitoring; Symptoms of hypotension and importance of maintaining adequate hydration; -Counseled to monitor BP at home twice a week -Recommended to continue current medication  Hyperlipidemia: (LDL goal < 70) -Not ideally controlled - LDL 66 (07/2021) at goal on atorvastatin; she reports no strokes since being Brilinta which she receives through PAP now -Hx strokes (multiple strokes in 2021); hx SAH after fall summer 2022 (held Brilinta x 2 weeks, then restarted) -Current treatment: Atorvastatin 40 mg daily - Appropriate, Query Effective, Safe, Accessible Brilinta 90 mg BID (PAP)- Appropriate, Effective, Safe, Accessible -Medications previously tried: simvastatin, Plavix -Educated on  Cholesterol goals; Benefits of statin for ASCVD risk reduction; -Recommend to continue current medication; renew Brilinta PAP before Jan 2024  Depression/Anxiety/Neurocognitive disorder (Goal: manage symptoms) -Controlled -Hospitalized Summer 2022 at [facility]  for paranoia, 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; 24/7 care advised, ultimately discharged home w/ family -PHQ9: 0 (04/2021) - minimal depression -GAD7: 12 (01/2021) - moderate anxiety -MMSE 25/30 (01/2021) - mild cognitive impairment -Connected with Neurology and [facility] for mental health support -Current treatment: Sertraline 100 mg 2 tab daily - Appropriate, Effective, Safe, Accessible Olanzapine 10 mg daily HS - Appropriate, Effective, Safe, Accessible Donepezil 5 mg daily HS - Appropriate, Effective, Safe, Accessible -Medications previously tried/failed: seroquel, mirtazapine, hydroxyzine -Educated on Benefits of medication for symptom control -Discussed benefits of donepezil - slow progression of cognitive decline, not necessarily improve cognition -Recommended to continue current medication  Patient Goals/Self-Care Activities Patient will:  - take medications as prescribed as evidenced by patient report and record review focus on medication adherence by pill box collaborate with provider on medication access solutions (Brilinta)       Medication Assistance:  Brilinta - [employer] approved through 05/25/22  Compliance/Adherence/Medication fill history: Care Gaps: Mammogram (due 01/11/21) Hep C screening  Star-Rating Drugs: Atorvastatin - PDC 94%  Medication Access: Within the past 30 days, how often has patient missed a dose of medication? 0 Is a pillbox or other method used to improve adherence? Yes  Factors that may affect medication adherence? no barriers identified Are meds synced by current pharmacy? No  Are meds delivered by current pharmacy? No  Does patient experience delays in picking up  medications due to transportation concerns? No   [facility] Services Reviewed: Is patient disadvantaged to use [facility]?: No  Current Rx insurance plan: [employer] Name and location of Current pharmacy:  [place], [place] - [address] [place] [address] Phone: [phone] Fax: [phone]  [facility] services reviewed with patient today?: No  Patient requests to transfer care to [facility]?: No  Reason patient declined to change pharmacies: Loyalty to other pharmacy/Patient preference   Care Plan and Follow Up Patient Decision:  Patient agrees to Care Plan and Follow-up.  Plan: The patient has been provided with contact information for the care management team and has been advised to call with any health related questions or concerns.   [name], PharmD, BCACP Clinical Pharmacist [facility] [phone]

## 2022-01-31 NOTE — Patient Instructions (Addendum)
Visit Information  Phone number for Pharmacist: [phone]   Goals Addressed   None     Care Plan : [place]  Updates made by Charlton Haws, [place] since 01/31/2022 12:00 AM     Problem: Hypertension, Hyperlipidemia, Depression, and Anxiety   Priority: High     Long-Range Goal: Disease mgmt   Start Date: 07/19/2021  Expected End Date: 01/31/2022  This Visit's Progress: On track  Recent Progress: On track  Priority: High  Note:   Current Barriers:  None identified  Pharmacist Clinical Goal(s):  Patient will contact provider office for questions/concerns as evidenced notation of same in electronic health record through collaboration with PharmD and provider.   Interventions: 1:1 collaboration with Tonia Ghent, MD regarding development and update of comprehensive plan of care as evidenced by provider attestation and co-signature Inter-disciplinary care team collaboration (see longitudinal plan of care) Comprehensive medication review performed; medication list updated in electronic medical record  Hypertension (BP goal <130/80) -Controlled - BP is at/near goal in office; pt is not checking at home; she does endorse occasional dizziness, she uses walker or cane to get around at all times -Hx strokes; OSA -Current home BP readings: not checking -Current treatment: Atenolol 25 mg BID - Query Appropriate, Effective, Safe, Accessible -Medications previously tried: metoprolol  -Educated on BP goals and benefits of medications for prevention of heart attack, stroke and kidney damage; Importance of home blood pressure monitoring; Symptoms of hypotension and importance of maintaining adequate hydration; -Counseled to monitor BP at home twice a week -Recommended to continue current medication  Hyperlipidemia: (LDL goal < 70) -Not ideally controlled - LDL 66 (07/2021) at goal on atorvastatin; she reports no strokes since being Brilinta which she receives through  PAP now -Hx strokes (multiple strokes in 2021); hx SAH after fall summer 2022 (held Brilinta x 2 weeks, then restarted) -Current treatment: Atorvastatin 40 mg daily - Appropriate, Query Effective, Safe, Accessible Brilinta 90 mg BID (PAP)- Appropriate, Effective, Safe, Accessible -Medications previously tried: simvastatin, Plavix -Educated on Cholesterol goals; Benefits of statin for ASCVD risk reduction; -Recommend to continue current medication; renew Brilinta PAP before Jan 2024  Depression/Anxiety/Neurocognitive disorder (Goal: manage symptoms) -Controlled -Hospitalized Summer 2022 at [facility] for paranoia, 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; 24/7 care advised, ultimately discharged home w/ family -PHQ9: 0 (04/2021) - minimal depression -GAD7: 12 (01/2021) - moderate anxiety -MMSE 25/30 (01/2021) - mild cognitive impairment -Connected with Neurology and [facility] for mental health support -Current treatment: Sertraline 100 mg 2 tab daily - Appropriate, Effective, Safe, Accessible Olanzapine 10 mg daily HS - Appropriate, Effective, Safe, Accessible Donepezil 5 mg daily HS - Appropriate, Effective, Safe, Accessible -Medications previously tried/failed: seroquel, mirtazapine, hydroxyzine -Educated on Benefits of medication for symptom control -Discussed benefits of donepezil - slow progression of cognitive decline, not necessarily improve cognition -Recommended to continue current medication  Patient Goals/Self-Care Activities Patient will:  - take medications as prescribed as evidenced by patient report and record review focus on medication adherence by pill box collaborate with provider on medication access solutions (Brilinta)      Patient verbalizes understanding of instructions and care plan provided today and agrees to view in [place]. Active MyChart status and patient understanding of how to access instructions and care plan via MyChart confirmed with patient.     The patient has been provided with contact information for the care management team and has been advised to call with any health related questions or concerns.  [name], PharmD, BCACP Clinical Pharmacist [facility] at [facility] [phone]

## 2022-02-01 LAB — PANCREATIC ELASTASE, FECAL: Pancreatic Elastase-1, Stool: >500 mcg/g

## 2022-02-03 ENCOUNTER — Other Ambulatory Visit: Payer: Self-pay

## 2022-02-03 MED ORDER — COLESTIPOL HCL 1 G PO TABS: 2.0000 g | ORAL_TABLET | Freq: Two times a day (BID) | ORAL | 0 refills | Status: DC

## 2022-02-04 ENCOUNTER — Telehealth: Payer: Self-pay

## 2022-02-04 NOTE — Telephone Encounter (Signed)
I called patient to discuss. No answer, phone number appears to be out of service. I will send a mychart message asking patient her to provide a better phone number.

## 2022-02-04 NOTE — Telephone Encounter (Signed)
I called patient's daughter, [name], per [facility].  I discussed patient's sleep study results and recommendations.  Patient's daughter agrees that patient should begin BiPAP.  I will send a BiPAP order to [place].  I advised her that Huey Romans should be calling patient within the next 2 weeks.  If not, they will let me know.  I reviewed compliance expectations for the BiPAP.  A follow-up was made for December 26 at 10:45 with Dr. Rexene Alberts for follow-up.  If patient has not started BiPAP by October 26, they will let us know.  Patient's daughter verbalized understanding of results and recommendations.  Patient's daughter had no questions or concerns at this time.  Bipap order faxed to [place]. Received a receipt of confirmation.

## 2022-02-04 NOTE — Procedures (Signed)
[facility] PAP TITRATION INTERPRETATION REPORT   STUDY DATE: 01/26/2022      PATIENT NAME:  [name] [name]         DATE OF BIRTH:  1941/05/19  PATIENT ID:  [id number]    TYPE OF STUDY:  BiPAP ST  READING PHYSICIAN: Star Age, MD, PhD SCORING TECHNICIAN: Forde Radon, RPSGT   HISTORY: 81 year old right-handed woman with an underlying medical history of stroke, irritable bowel syndrome, hypertension, hyperlipidemia, reflux disease, depression, arthritis, and obesity, who presents for a designated BiPAP titration study to treat her primary central sleep apnea. She has been on AutoPap therapy with difficulty tolerating treatment. She has had interim weight loss as well. Height: 60.0 in Weight: 124 lb (BMI 24).  DESCRIPTION: A sleep technologist was in attendance for the duration of the recording.  Data collection, scoring, video monitoring, and reporting were performed in compliance with the AASM Manual for the Scoring of Sleep and Associated Events; (Hypopnea is scored based on the criteria listed in Section VIII D. 1b in the AASM Manual V2.6 using a 4% oxygen desaturation rule or Hypopnea is scored based on the criteria listed in Section VIII D. 1a in the AASM Manual V2.6 using 3% oxygen desaturation and /or arousal rule). A physician certified by the American Board of Sleep Medicine reviewed each epoch of the study.   MEDICATIONS: Tenormin, Lipitor, Vitamin D3, Aricept, Melatonin, Zyprexa, Glycolax/Miralax, Zoloft, Thiamine, Brilinta  SLEEP CONTINUITY AND SLEEP ARCHITECTURE:  Lights out was at 21:44: and lights on 05:00: (7.3 hours in bed). Total sleep time was 292.5 minutes (94.2% supine;  5.8% lateral;  0.0% prone, 19.8% REM sleep), with a decreased sleep efficiency at 67.0%. Sleep latency was increased at 43.0 minutes.  Of the total sleep time, the percentage of stage N1 sleep was 2.9%, stage N2 sleep was 68.4%, which is increased, stage N3 sleep was 8.9%, and  REM sleep was 19.8%, which is normal. There were 1 Stage R periods observed on this study night, 3 awakenings (i.e. transitions to Stage W from any sleep stage), and 13.0 total stage transitions. Wake after sleep onset (WASO) time accounted for 101 minutes with difficulty going back to sleep in the early morning hours, otherwise, no significant sleep fragmentation noted.  AROUSAL: There were 4 arousals in total, for an arousal index of 0.8 arousals/hour.  Of these, 2 were identified as respiratory-related arousals (0.4 /h), 0 were PLM-related arousals (0.0 /h), and 5 were non-specific arousals (1.0 /h)  PAP Titration details: The patient used her typical mask, ResMed P10 small nasal pillows. She was started on standard BiPAP of 7/3 cm and switched to BiPAP ST due to ongoing central events. She was titrated to a final pressure of 9/5 cm with supine REM sleep achieved and O2 nadir of 91%. She had a respiratory rate of about 14/min. I will recommend a back up rate of 12/min on home BiPAP ST. TST of 179.5 min on the final pressure settings.  RESPIRATORY MONITORING:  Based on CMS criteria (using a 4% oxygen desaturation rule for scoring hypopneas), there were 22 apneas (1 obstructive; 14 central; 7 mixed), and 11 hypopneas.  Apnea index was 4.5. Hypopnea index was 2.3. The apnea-hypopnea index was 6.8 overall (7.2 supine, 0.0 non-supine; 2.1 REM, 2.1 supine REM). There were 0 respiratory effort-related arousals (RERAs).  The RERA index was 0.0 events/h. Total respiratory disturbance index (RDI) was 6.8 events/h. RDI results showed: supine RDI  7.2 /h; non-supine RDI 0.0 /h;  REM RDI 2.1 /h, supine REM RDI 2.1 /h.   Based on AASM criteria (using a 3% oxygen desaturation and /or arousal rule for scoring hypopneas), there were 22 apneas (1 obstructive; 14 central; 7 mixed), and 11 hypopneas. Apnea index was 4.5. Hypopnea index was 2.3. The apnea-hypopnea index was 6.8 overall (7.2 supine, 0.0 non-supine; 2.1 REM, 2.1  supine REM). There were 0 respiratory effort-related arousals (RERAs).  The RERA index was 0.0 events/h. Total respiratory disturbance index (RDI) was 6.8 events/h. RDI results showed: supine RDI  7.2 /h; non-supine RDI 0.0 /h; REM RDI 2.1 /h, supine REM RDI 2.1 /h.  Respiratory events were associated with oxyhemoglobin desaturations (nadir during sleep 80%) from a mean of 95%). There were 0 occurrences of Cheyne Stokes breathing. OXIMETRY: Total sleep time spent at, or below 88% was 0.0 minutes.  BODY POSITION: Duration of total sleep and percent of total sleep in their respective position is as follows: supine 275 minutes (94.2%), non-supine 17.0 minutes (5.8%); right 17 minutes (5.8%), left 00 minutes (0.0%), and prone 00 minutes (0.0%). Total supine REM sleep time was 58 minutes (100.0% of total REM sleep). LIMB MOVEMENTS: There were 0 periodic limb movements of sleep (0.0/h), of which 0 (0.0/h) were associated with an arousal. CARDIAC: The electrocardiogram showed normal sinus rhythm. The average heart rate during sleep was 52 bpm.  The maximum heart rate during sleep was 75 bpm. The maximum heart rate during recording was 101.  AUDIO/VIDEO: The video and audio analysis did not show any abnormal or unusual behaviors, movements, phonations or vocalizations. POST-STUDY COMMENTS: Post study, the patient indicated, that sleep was worse than usual. ? ?  IMPRESSION:  1. Primary Central Sleep Apnea RECOMMENDATIONS: This study showed near-complete resolution of the patient's primary central sleep apnea with BiPAP ST. I recommend home BiPAP ST at 9/5 cm with a back up rate of 12/min, via small P10 nasal pillows (patient's mask of choice). The patient will be advised to be fully compliant with PAP therapy to improve sleep related symptoms and decrease long term cardiovascular risks. The patient should be reminded, that it may take up to 3 months to get fully used to using PAP with all planned sleep. The  earlier full compliance is achieved, the better long term compliance tends to be. Please note that untreated sleep apnea may carry additional perioperative morbidity. Patients with significant obstructive sleep apnea should receive perioperative PAP therapy and the surgeons and particularly the anesthesiologist should be informed of the diagnosis and the severity of the sleep disordered breathing. The patient should be cautioned not to drive, work at heights, or operate dangerous or heavy equipment when tired or sleepy. Review and reiteration of good sleep hygiene measures should be pursued with any patient. The patient will be seen in follow-up in the sleep clinic at [facility] for discussion of the test results, symptom and treatment compliance review, further management strategies, etc. The referring provider will be notified of the test results. I certify that I have reviewed the entire raw data recording prior to the issuance of this report in accordance with the Standards of Accreditation of the American Academy of Sleep Medicine (AASM). Star Age,  MD, PhD

## 2022-02-04 NOTE — Telephone Encounter (Signed)
-----   Message from Star Age, MD sent at 02/04/2022  9:07 AM EDT ----- Pt had a designated BiPAP titration study on 01/26/22 to treat her primary central sleep apnea. She has been on AutoPap therapy with difficulty tolerating treatment. She has had interim weight loss as well. She did well with BiPAP of 9/5 cm. I will Rx BiPAP treatment instead of autoPAP. She will need a FU appt in sleep clinic in 2-3 mo post set up. She can see me or Janett Billow. Please explain/notify pt and send order to DME.

## 2022-02-04 NOTE — Addendum Note (Signed)
Addended by: Star Age on: 02/04/2022 09:07 AM   Modules accepted: Orders

## 2022-02-06 ENCOUNTER — Ambulatory Visit: Payer: Medicare HMO | Admitting: Adult Health

## 2022-02-06 ENCOUNTER — Encounter: Payer: Self-pay | Admitting: Adult Health

## 2022-02-06 VITALS — BP 130/76 | HR 60 | Ht 60.0 in | Wt 124.6 lb

## 2022-02-06 DIAGNOSIS — G3184 Mild cognitive impairment, so stated: Secondary | ICD-10-CM

## 2022-02-06 DIAGNOSIS — G4731 Primary central sleep apnea: Secondary | ICD-10-CM

## 2022-02-06 DIAGNOSIS — Z8673 Personal history of transient ischemic attack (TIA), and cerebral infarction without residual deficits: Secondary | ICD-10-CM

## 2022-02-06 NOTE — Patient Instructions (Addendum)
Highly recommend doing memory exercises at home  Continue to use your Rolator walker at all times unless otherwise instructed   Continue Brilinta (ticagrelor) 90 mg bid  and atorvastatin  for secondary stroke prevention  Continue to follow up with PCP regarding cholesterol and blood pressure management  Maintain strict control of hypertension with blood pressure goal below 130/90 and cholesterol with LDL cholesterol (bad cholesterol) goal below 70 mg/dL.   Signs of a Stroke? Follow the BEFAST method:  Balance Watch for a sudden loss of balance, trouble with coordination or vertigo Eyes Is there a sudden loss of vision in one or both eyes? Or double vision?  Face: Ask the person to smile. Does one side of the face droop or is it numb?  Arms: Ask the person to raise both arms. Does one arm drift downward? Is there weakness or numbness of a leg? Speech: Ask the person to repeat a simple phrase. Does the speech sound slurred/strange? Is the person confused ? Time: If you observe any of these signs, call 911.      Followup in the future with me in 6 months or call earlier if needed       Thank you for coming to see [place] at [facility]. I hope we have been able to provide you high quality care today.  You may receive a patient satisfaction survey over the next few weeks. We would appreciate your feedback and comments so that we may continue to improve ourselves and the health of our patients.

## 2022-02-06 NOTE — Progress Notes (Signed)
[facility] [address] [place]. [place] [address] [phone]       STROKE FOLLOW UP NOTE  [name] Date of Birth:  09-18-1940 Medical Record Number:  [id number]   Primary neurologist: Dr. Leonie Man Sleep neurologist: Dr. Rexene Alberts Reason for Referral: stroke follow up    SUBJECTIVE:   CHIEF COMPLAINT:  Chief Complaint  Patient presents with   Hx of multiple strokes    Rm 3, 6 month FU dgtrVinnie Level  MMSE 23  "feel like I have confusion"      HPI:   Update 02/06/2022 JM: Patient returns for 79-monthstroke follow-up accompanied by her daughter  She has been stable from a stroke standpoint without new stroke/TIA symptoms.  Remains on Brilinta and atorvastatin.  Blood pressure well controlled.  Loop recorder has not shown atrial fibrillation thus far.  Cognition 23/30 (prior 25/30) Feels like her memory has been gradually declining, reports she can be "confused" at time (such as drawing a clock or unable to remember what she is supposed to be doing).  Mornings can be worse.  Still underlying anxiety as well as to be followed by behavioral health with medication changes back in June, per daughter believes anxiety/depression has been improving Remains on Aricept, denies side effects She continues to live alone maintains ADLs independently.  Family checks on her routinely.  Recently repeated sleep study which showed severe sleep apnea with a primary central component and recommended treatment with BiPAP after completion of titration study, prior use of CPAP.  She is currently awaiting for machine.  She is scheduled to be seen with Dr. ARexene Alberts12/26 for initial follow-up.  She does report using her CPAP machine still but unable to use throughout the whole duration of the night.  No further concerns at this time    History provided for reference purposes only Update 08/06/2021 JM: 81year old female with history of multiple recurrent strokes and MCI.  Returns  today for 79-monthollow-up visit accompanied by her daughter, [name] [name] Level Feels more shaky/unsteady while walking which has been a gradual decline since prior visit.  Besides doing things simple chores around her home, there is no routine activity or exercise.  She continues to use a roller walker, did have fall 2 weeks ago after waking up in recliner around 1am and while trying to walk to her bed, she fell on her bottom, did not hit head, no injury. Denies any other falls. Poor appetite but daughter ensures she eats appropriately. Sleeping well more recently. Occasional use of CPAP, at times can have difficulty putting mask on.  Denies new stroke/TIA symptoms.  Remains on Brilinta and atorvastatin without side effects.  Blood pressure today 136/74.  Loop recorder has not shown atrial fibrillation thus far.  Cognition stable with good days/bad days.  Remains on Aricept, denies side effects.  Depression and anxiety currently on Zyprexa and Zoloft, closely monitored and managed by behavioral health with reported improvement since prior visit per BHEddyut per patient, no significant improvement - does get sad regarding loss of independence since her strokes.   Living alone and able to maintain ADLs independently.  Does have supportive family who assists with majority of IADLs.  Does do simple cleaning and cooking.  No further concerns at this time.  Update 02/04/2021 JM: Ms. 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  Started on Zyprexa and donepezil and titrated home dose Zoloft.  She refused MRI during admission for further clarification of possible vascular dementia.  She also had a fall while hospitalized resulting in [facility] - held Brilinta for 2 weeks per neurosurgery  recommendations.  Also treated for UTI.  [place] 8/15 23/30.  Recommended 24/7 care or placement in assisted living.  She was ultimately discharged home with family.  Since discharge, she has been doing well.  Daughter reports improvement of paranoia and psychosis on current regimen.  Family was initially providing 24/7 supervision but as she was doing well continue ADLs majority of IADLs independently, family started to leave her by herself a couples hrs a day since last week. She did have a fall yesterday with right leg giving out but thankfully without injury.  She is currently working with [facility] PT for strengthening and gait as well as lower back exercises and use of Rollator walker at all times.  Daughter still assists with medication and bill paying.  Cognition overall stable.  MMSE today 25/30.  Remains on Aricept.  Also remains on Brilinta and atorvastatin.  Blood pressure today 143/83.  No further concerns at this time   Update 11/05/2020 Dr. Leonie Man: She returns for follow-up after last visit with [place] practitioner 6 weeks ago.  She is accompanied by her daughter.  Their main complaint today is her memory loss which she has had since stroke.  Her short-term memory is poor.  She is also continues to have problems with anxiety and paranoia.  She is currently being managed by [facility] psychiatry.  She initially tried Lexapro and Zoloft without benefit.  She is currently taking Seroquel at night but mostly to help her sleep.  She still having some mild anxiety and paranoid ideation.  She continues to have mild weakness in the right leg as well as left hand digits getting better.  She is tolerating Brilinta well without any bruising.  Blood pressures well controlled today it is 131/86.  She is tolerating Lipitor well without muscle aches and pains.  Patient is having short-term memory difficulties ever since her stroke which is not getting better.  Her daughter started helping her do her bills and  finances.  She still lives alone and is independent.  She has not had any work-up for reversible causes of memory loss.  Update 09/12/2020, Frann Rider, NP ) Ms. Mcconaughy returns for acute visit regarding new onset symptoms accompanied by her daughter  Approx 1 month ago, she suffered a fall after her right leg gave out.  She was fully evaluated in ED who felt likely due to chronic knee pain but per family request, CT head and MRI brain completed which was unremarkable for acute stroke.  Since that time, daughter has noticed worsening memory with increased times of confusion, forgetfulness and unsteadiness. Per daughter, symptoms onset around the time aspirin was discontinued.  Patient has chronic history of low back pain previously seen by [name] in 10/2019 but no recent follow-up as this was relatively stable but over the past month, pain has been more consistent and more frequent episodes of bilateral hip weakness sensation and right leg weakness typically occurring after prolonged ambulation.  She does have scheduled visit with [name] on 4/28 to undergo nerve block injections.   She does continue to live independently but her daughter checks on her routinely and assists with IADLs.  She has  been greatly struggling with anxiety over the past month.  She does have history of anxiety but recently greatly worsened.  PCP recently started Lexapro and recommended increasing to 15 mg daily but she is very hesitant to increase as she read online Lexapro can worsen anxiety.  She is also been struggling with insomnia as her mind will constantly wander with hydroxyzine prescribed by PCP but she denies much benefit.  She speaks of difficulty with her relationships as she has been limiting social interactions due to her low back pain and fear of her right leg giving out - she also feels like she is constantly being judged by her friends or worried about what they are thinking of her.  She is concerned if she  misses a social event due to her low back pain and what sounds like social anxiety.  She is also fearful of undergoing nerve block injections as she is on Brilinta and feels like this should be discontinued prior to injection but was told by [name] nurse assistant that that was not necessary.  Of note, seen by PCP Dr. Damita Dunnings 4/5 regarding these concerns who felt multifactorial and likely not new acute strokes.  She was also treated for shingles with Valtrex.  She has remained on Brilinta and atorvastatin without associated side effects Blood pressure 142/86 -occasionally monitored at home and typically stable She does endorse nightly compliance with CPAP for OSA management Loop recorder has not shown atrial fibrillation thus far  No further concerns at this time   Update 07/04/2020 JM: Ms. Suchocki returns for scheduled follow-up regarding prior strokes accompanied by her daughter. She has been stable since prior visit without any additional stroke/TIA symptoms. Reports residual left hand weakness and gait impairment. She will use RW at times especially upon awakening but otherwise doesn't need AD. She denies any recent falls.  She has remained on Brilinta, aspirin and atorvastatin 80 mg daily without side effects.  59-monthaspirin and Brilinta duration will be completed at the end of this month.  Blood pressure today 121/74.  Monitors at home and typically stable.  Reports ongoing compliance with CPAP followed by Dr. ARexene Alberts Due to concern of multiple strokes etiology cryptogenic, loop recorder placed by Dr. ARayann Hemanon 06/20/2020 for further long-term monitoring of atrial fibrillation.  No further concerns at this time.  Update 05/14/2020 JM: Ms. FAnastacioreturns per request to follow-up on recent hospitalizations.  She is accompanied by her daughter.  Personally reviewed recent hospitalizations with summary provided below.  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  P2 Y 12 4 showing inadequate Plavix function therefore switched to aspirin 81 mg daily and Brilinta 90 mg twice daily for 3 months and then continue on Brilinta alone.  Initially recommended discharge to CIR but due to functional improvement she was discharged home on 04/23/2020 with home health therapies.  [facility] 1 wk hx of intermittent LUE and LLE weakness, numbness/tingling and right-sided headache but due to wait time she left prior to full evaluation [facility] reporting acute to early subacute infarct in the left centrum semiovale which was not consistent with presenting symptoms.  Per further review with neurologist, it was felt as though reported new infarct seen on prior MRIs therefore no evidence of new stroke.  Evaluated by neurology  with note personally reviewed and felt possible cause of symptoms in setting of hypoperfusion with known intracranial arthrosclerosis and atenolol dosage decreased.  She then returned to [facility] ED yesterday, 05/13/2020, for left lower extremity heaviness, palpitations and headache.  Found to be hypertensive with BP 196/105.  No neurological deficits noted.  Symptoms resolved without intervention.  Mention of increased anxiety possibly contributing to symptoms.  CT head negative.  She was discharged back home in stable condition.  She reports continued intermittent b/l hand weakness where she will occasionally drop items and continued gait impairment currently working with [facility] PT. currently using rolling walker for ambulation.  Denies new or worsening stroke/TIA symptoms.  Remains on aspirin 81 mg daily and Brilinta without bleeding or bruising.  Remains on atorvastatin 80 mg daily without myalgias.  Blood pressure today 128/75.  She does routinely monitor at home but she questions accuracy  as her results are consistently 150s.  She does report nightly use of CPAP for OSA management.  Completed approximately 14 days of cardiac monitor which was negative for atrial fibrillation.  She questions need of ILR.  She does endorse increased anxiety regarding multiple strokes and WHEN (not if) another one will occur.  No further concerns at this time.  Update 03/01/2020 JM: Ms. Kegg returns for sooner scheduled visit per PCP request due to recent stroke accompanied by her daughter.  She presented to [facility] emergency room on 02/16/2020 with right-sided weakness contributing to fall.  MRI showed left frontal lobe infarcts likely from stenosis and left M2 all within the same vascular territory and was not felt to be embolic.  MRA head showed progression of stenosis within the distal A2/proximal artery which is now severe with additional intracranial atherosclerotic stenosis without interval change including severe focal stenosis within the cavernous left ICA, moderate focal stenosis within the cavernous right ICA, moderate stenosis with A2 right ACA and moderate stenosis within the proximal P2 left PCA.  2D echo showed EF of 60 to 65%.  On aspirin PTA and recommended DAPT " but long-term she may need Effient instead of Plavix or Brilinta".  Recommended to check Plavix assays outpatient to ensure adequate response.  HTN stable.  LDL 54 recommended continuation of atorvastatin 80 mg daily.  She did have right knee pain secondary to fall and recommended follow-up with orthopedics outpatient.  Evaluated by therapy initially recommended [facility] PT/OT but improved prior to discharge with [facility] therapy no longer indicated.  She was discharged home in stable condition and advised to follow-up with PCP, neurology and orthopedics.    She has been stable since returning home without new or reoccurring stroke/TIA symptoms.  Denies residual right-sided symptoms but does continue to have chronic right knee pain and ankle  pain post fall which has been limiting her ambulation.  She is currently working with [facility] PT and ambulating with rolling walker due to right leg pain.  Continues to have mild dysarthria and decreased LUE dexterity which has been stable after recent stroke and improvement since prior visit.  She has continued on aspirin and Plavix without bleeding or bruising.  Remains on atorvastatin 80 mg daily without myalgias.  Blood pressure today satisfactory at 112/69.  She reports intermittent use of CPAP for sleep apnea management with difficulty tolerating due to increased pressure.  Reports undergoing sleep study over 10 years ago and has not had any repeat study or follow-up to ensure adequate management of apnea.  Multiple questions regarding recent stroke and etiology as  well as recommended Plavix lab work Nurse, children's) as recommended by [employer] neurologist.  Further reviewed recent admission and imaging with Dr. Leonie Man due to concern of stroke locations and possible embolic etiology. Per Dr. Leonie Man, he does not believe recent stroke was due to large vessel disease but more so embolic secondary to unknown source as her recent imaging showed infarcts within different vascular locations including ACA and MCA.  Also reviewed imaging from prior stroke in 06/2019 and due to size, potentially embolic. He recommends further cardiac evaluation with placement of loop recorder to assess for possible atrial fibrillation as stroke etiology.  No indication for TEE. He recommends aspirin and Plavix for only 3-week duration and then Plavix alone.  No indication for P2 Y 12 testing as she has no history or evidence of Plavix failure or concern.  Update 01/02/2020 JM: Ms. Chiem is being seen for stroke follow-up previously followed through [facility] research participating in [place] stroke trial. Evaluated during stroke trial on 10/10/2019 recently completing [place] therapies with residual deficits of mild decrease left hand dexterity,  imbalance and dysarthria therefore additional orders placed for outpatient therapies.  She does report ongoing improvement and continues to work with neuro rehab PT/OT/ST for residual dysarthria and dysphonia, gait impairment/imbalance and LUE incoordination. Evaluated in [place] by Dr. 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  Reports continued participation SLP with [facility] Reports imbalance greatest difficulty still with improvement but also underlying chronic lower back, bilateral hip and knee pain.  She wishes to proceed with additional injections and nerve blocks therefore withdrew from research study approximately 1 month ago.  Continues to follow with [facility] She remains on aspirin '81mg'$  daily without bleeding or bruising. Continues on atorvastatin 80 mg daily without myalgias.  Recent lipid panel 10/13/2019 showed LDL 64.  Blood pressure today 116/70.  No further concerns.  Stroke admission 07/11/2019 Personally reviewed recent hospitalization pertinent notes, labs and imaging Ms. ARDIE DRAGOO is a 81 y.o. female with history of HTN, HLD, obesity who presented on 07/11/2019 with dysarthria, L facial droop, mild L hemiparesis as well as hypertensive urgency.  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  Recommended DAPT for 3 weeks and aspirin alone.  BP upon arrival 202/110 stabilized during admission and recommended long-term BP goal normotensive range.  LDL 130 and initiate atorvastatin 80 mg daily. Other stroke risk factors include advanced age, EtOH use, OSA on CPAP and family reported hx imaging with evidence of small AAA.  Evaluated by therapy and recommend discharge to CIR for ongoing therapy needs but unfortunately did not by insurance therefore discharged home with home health therapy.  Also advised to follow-up with [facility] research is interested in [employer] stroke trial.   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  MRI  R corona radiata and caudate head small vessel disease. infarcts CTA head & neck negative LVO, negative arthrosclerosis or stenosis in neck, positive for intracranial arthrosclerosis with significant stenosis within moderate to severe left ICA anterior genu, moderate bilateral ICA distally to and moderate left PCA P1 2D Echo  EF 50 to 55% LDL 130 HgbA1c 5.5 Lovenox 40 mg sq daily for VTE prophylaxis No antithrombotic prior to admission, now on clopidogrel 75 mg daily and aspirin 324 mg. Change aspirin to 81. Continue DAPT x 3 weeks then aspirin alone.  Therapy recommendations: CLR -  insurance denied therefore recommended HH therapy Disposition: Home Recommend aspirin and Plavix for 3 weeks followed by aspirin alone. She is  participating in the [employer] stroke prevention trial( standard of care antiplatelets and Factor xi inhibitor versus standard of care antiplates and placebo)         ROS:   14 system review of systems performed and negative with exception of see HPI   PMH:  Past Medical History:  Diagnosis Date   Arthritis    knees   Depression    Fatty liver    GERD (gastroesophageal reflux disease)    Hyperlipidemia    Hypertension    IBS (irritable bowel syndrome)    Obesity    Pneumonia    Sleep apnea    uses CPAP   Stroke (cerebrum) (HCC)     PSH:  Past Surgical History:  Procedure Laterality Date   BLADDER SURGERY     BREAST SURGERY     breast biopsy   BROW LIFT Bilateral 04/14/2017   Procedure: BLEPHAROPLASTY UPPER EYELID WITH EXCESS SKIN;  Surgeon: Karle Starch, MD;  Location: [place];  Service: Ophthalmology;  Laterality: Bilateral;   CATARACT EXTRACTION W/PHACO Left 02/05/2016   Procedure: CATARACT EXTRACTION PHACO AND INTRAOCULAR LENS PLACEMENT ([place]);  Surgeon: Birder Robson, MD;  Location: [facility];  Service: Ophthalmology;  Laterality: Left;   [place] 01:10 AP% 22.3 CDE 15.67 Fluid pack lot # [id number]   CATARACT EXTRACTION W/PHACO Right 02/26/2016   Procedure: CATARACT EXTRACTION PHACO AND INTRAOCULAR LENS PLACEMENT (IOC);  Surgeon: Birder Robson, MD;  Location: [facility];  Service: Ophthalmology;  Laterality: Right;  [place] 57.4 AP% 24.0 CDE 13.75 Fluid Pack lot # [id number]   CHOLECYSTECTOMY     COLONOSCOPY WITH PROPOFOL N/A 12/18/2014   Procedure: COLONOSCOPY WITH PROPOFOL;  Surgeon: Manya Silvas, MD;  Location: [facility] ENDOSCOPY;  Service: Endoscopy;  Laterality: N/A;   DEEP NECK LYMPH NODE BIOPSY / EXCISION     implantable loop recorder implantation  06/20/2020   Medtronic Reveal Linq model LNQ 22 ([place] [id number]) implantable loop recorder for cryptogenic stroke   JOINT REPLACEMENT     KNEE ARTHROPLASTY Left 06/20/2015   Procedure: COMPUTER ASSISTED TOTAL KNEE ARTHROPLASTY;  Surgeon: Dereck Leep, MD;  Location: [facility];  Service: Orthopedics;  Laterality: Left;   PTOSIS REPAIR Bilateral 04/14/2017   Procedure: PTOSIS REPAIR RESECT EX;  Surgeon: Karle Starch, MD;  Location: [place];  Service: Ophthalmology;  Laterality: Bilateral;  sleep apnea   TONSILLECTOMY     TOTAL HIP ARTHROPLASTY Right 07/21/2018   Procedure: TOTAL HIP ARTHROPLASTY ANTERIOR APPROACH;  Surgeon: Gaynelle Arabian, MD;  Location: [facility];  Service: Orthopedics;  Laterality: Right;    Social History:  Social History   Socioeconomic History   Marital status: Widowed    Spouse name: Not on file   Number of children: 3   Years of education: 43   Highest education level: Not on file  Occupational History   Occupation: retired  Tobacco Use   Smoking status: Never   Smokeless tobacco: Never  Vaping Use   Vaping Use: Never used  Substance and Sexual Activity   Alcohol use: Not Currently    Comment: ocassional glass of wine 2-3 times a year   Drug use: No   Sexual activity: Not Currently  Other Topics Concern   Not on file  Social History  Narrative   Lives alone in home   Right Handed   Drinks  1-2 cups caffeine daily   Social Determinants of Health   Financial Resource Strain: Low Risk  (07/17/2021)   Overall Financial Resource Strain (CARDIA)    Difficulty of Paying Living Expenses: Not very hard  Food Insecurity: No Food Insecurity (04/30/2021)   Hunger Vital Sign    Worried About Running Out of Food in the Last Year: Never true    Ran Out of Food in the Last Year: Never true  Transportation Needs: No Transportation Needs (04/30/2021)   PRAPARE - Hydrologist (Medical): No    Lack of Transportation (Non-Medical): No  Physical Activity: Insufficiently Active (04/30/2021)   Exercise Vital Sign    Days of Exercise per Week: 3 days    Minutes of Exercise per Session: 10 min  Stress: No Stress Concern Present (04/30/2021)   [place]    Feeling of Stress : Not at all  Social Connections: Socially Isolated (04/30/2021)   Social Connection and Isolation Panel [NHANES]    Frequency of Communication with Friends and Family: More than three times a week    Frequency of Social Gatherings with Friends and Family: More than three times a week    Attends Religious Services: Never    Marine scientist or Organizations: No    Attends Archivist Meetings: Never    Marital Status: Widowed  Intimate Partner Violence: Not At Risk (04/30/2021)   Humiliation, Afraid, Rape, and Kick questionnaire    Fear of Current or Ex-Partner: No    Emotionally Abused: No    Physically Abused: No    Sexually Abused: No    Family History:  Family History  Problem Relation Age of Onset   Heart attack Mother    Dementia Father    Heart attack Father 71   Aortic aneurysm Brother    Stroke Paternal Grandfather    Colon cancer Neg Hx    Breast cancer Neg Hx     Medications:   Current Outpatient Medications on File Prior to Visit   Medication Sig Dispense Refill   atenolol (TENORMIN) 25 MG tablet TAKE 1 TABLET BY MOUTH 2 TIMES DAILY. 180 tablet 1   atorvastatin (LIPITOR) 40 MG tablet Take 1 tablet (40 mg total) by mouth daily. 90 tablet 3   Cholecalciferol (VITAMIN D3) 25 MCG (1000 UT) CAPS Take 1 capsule (1,000 Units total) by mouth daily.     colestipol (COLESTID) 1 g tablet Take 2 tablets (2 g total) by mouth 2 (two) times daily. 120 tablet 0   donepezil (ARICEPT) 5 MG tablet TAKE 1 TABLET BY MOUTH DAILY. 90 tablet 1   OLANZapine (ZYPREXA) 10 MG tablet Take 1 tablet (10 mg total) by mouth at bedtime. 30 tablet 3   sertraline (ZOLOFT) 100 MG tablet Take 2 tablets (200 mg total) by mouth daily. 60 tablet 3   thiamine 100 MG tablet Take 1 tablet (100 mg total) by mouth daily. 90 tablet 1   ticagrelor (BRILINTA) 90 MG TABS tablet Take 1 tablet (90 mg total) by mouth 2 (two) times daily. Via [employer] pt assistance 180 tablet 0   No current facility-administered medications on file prior to visit.    Allergies:   Allergies  Allergen Reactions   Hydrocodone Nausea Only    Noted after surgery, may be able to tolerate with food      OBJECTIVE:  Physical Exam  Vitals:   02/06/22 1306  BP: 130/76  Pulse: 60  Weight: 124 lb 9.6 oz (56.5 kg)  Height: 5' (1.524 m)    Body mass index is 24.33 kg/m. No results found.  General: well developed, well nourished, very pleasant elderly Caucasian female, seated, in no evident distress Neck: supple with no carotid or supraclavicular bruits Cardiovascular: regular rate and rhythm, no murmurs Vascular:  Normal pulses all extremities  Neurologic Exam Mental Status: Awake and fully alert.  Fluent speech and language.  Oriented to place and time. Recent and remote memory impaired. Attention span, concentration and fund of knowledge mostly appropriate with daughter supplementing history. Mood and affect appropriate.     02/06/2022    1:12 PM 08/06/2021   12:41 PM 02/04/2021    10:38 AM  MMSE - Mini Mental State Exam  Orientation to time '3 5 5  '$ Orientation to Place '4 4 5  '$ Registration '3 3 3  '$ Attention/ Calculation '2 3 2  '$ Recall '3 2 1  '$ Language- name 2 objects '2 2 2  '$ Language- repeat 0 1 1  Language- follow 3 step command '3 3 3  '$ Language- read & follow direction '1 1 1  '$ Write a sentence '1 1 1  '$ Copy design 1 0 1  Total score '23 25 25   '$ Cranial Nerves: Pupils equal, briskly reactive to light. Extraocular movements full without nystagmus. Visual fields full to confrontation. Hearing intact. Facial sensation intact.  Tongue and palate moves normally and symmetrically.  Mild left lower facial weakness Motor: Normal bulk and tone. Normal strength in all tested extremity muscles except mild weakness in left hand and mild HF weakness Sensory.: intact to touch , pinprick , position and vibratory sensation.  Coordination: Rapid alternating movements normal in all extremities except decreased left hand. Finger-to-nose and heel-to-shin performed accurately bilaterally.  Gait and Station: Stands from seated position without difficulty.  Stance is slightly hunched.  Gait demonstrates relatively normal stride length with slight unsteadiness and imbalance with use of Rollator walker.  Tandem walking heel toe not attempted. Reflexes: 1+ and symmetric. Toes downgoing.       ASSESSMENT/PLAN: [name] [name] is a 81 y.o. 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  P2Y12 4 on Plavix.  2 additional ED evaluations 05/09/2020 and 05/13/2020 for worsening left-sided weakness and numbness without evidence of acute infarct contributing to symptoms and felt likely in setting of hypoperfusion with intracranial arthrosclerosis.  Vascular risk factors include HTN, HLD, intracranial arthrosclerosis with stenosis, OSA on CPAP, EtOH use and obesity.     1.  Mild cognitive impairment -MMSE 23/30 (prior 25/30) -Occasional confusion about different tasks, possibly related to progressing memory decline vs untreated severe primarily central sleep apnea. Advised to call office if symptoms should worsen or do not improve once BiPAP initiated for further evaluation -Continue Aricept 5 mg daily managed by PCP -Discussed importance of increasing daytime activity -discussed importance of routine memory exercises -discussed likely benefit of initiating BiPAP and importance of nightly BiPAP usage -Continue to follow with [facility] for depression and anxiety management -Dementia panel 10/2020 unremarkable -EEG 10/2020 normal   2.  History of prior strokes -Continue Brilinta and atorvastatin for secondary stroke prevention measures -Loop recorder has not shown atrial fibrillation thus far -Maintain strict control of hypertension with blood pressure goal below 130/90,  and lipids with LDL cholesterol goal below 70 mg percent  -Lipid panel 07/2021: LDL 66     Follow-up in 6 months or call earlier if needed    I spent 34 minutes of face-to-face and non-face-to-face time with patient and daughter.  This included previsit chart review, lab review, study review, order entry, electronic health record documentation, patient and daughter discussion/education regarding above diagnoses and treatment plan and answered all other questions to patient and daughter satisfaction  CC:  Tonia Ghent, MD     Frann Rider, AGNP-BC  [facility] [address] [place], [place] [address]  Phone [phone] Fax [phone] Note: This document was prepared with digital dictation and possible smart phrase technology. Any transcriptional errors that result from this process are unintentional.

## 2022-02-21 ENCOUNTER — Ambulatory Visit (INDEPENDENT_AMBULATORY_CARE_PROVIDER_SITE_OTHER): Payer: Medicare HMO

## 2022-02-21 DIAGNOSIS — I639 Cerebral infarction, unspecified: Secondary | ICD-10-CM | POA: Diagnosis not present

## 2022-02-21 LAB — CUP PACEART REMOTE DEVICE CHECK
Date Time Interrogation Session: 20230928230405
Implantable Pulse Generator Implant Date: 20220126

## 2022-02-22 DIAGNOSIS — I10 Essential (primary) hypertension: Secondary | ICD-10-CM

## 2022-02-22 DIAGNOSIS — E785 Hyperlipidemia, unspecified: Secondary | ICD-10-CM

## 2022-02-26 NOTE — Progress Notes (Signed)
Carelink Summary Report / Loop Recorder 

## 2022-02-28 ENCOUNTER — Other Ambulatory Visit: Payer: Self-pay | Admitting: Adult Health

## 2022-03-04 ENCOUNTER — Other Ambulatory Visit: Payer: Self-pay | Admitting: Behavioral Health

## 2022-03-04 DIAGNOSIS — F411 Generalized anxiety disorder: Secondary | ICD-10-CM

## 2022-03-04 DIAGNOSIS — F331 Major depressive disorder, recurrent, moderate: Secondary | ICD-10-CM

## 2022-03-04 NOTE — Telephone Encounter (Signed)
Please schedule appt

## 2022-03-06 ENCOUNTER — Ambulatory Visit: Payer: Medicare HMO | Admitting: Physician Assistant

## 2022-03-07 ENCOUNTER — Other Ambulatory Visit: Payer: Self-pay | Admitting: Behavioral Health

## 2022-03-07 DIAGNOSIS — R630 Anorexia: Secondary | ICD-10-CM

## 2022-03-07 DIAGNOSIS — F331 Major depressive disorder, recurrent, moderate: Secondary | ICD-10-CM

## 2022-03-07 DIAGNOSIS — F411 Generalized anxiety disorder: Secondary | ICD-10-CM

## 2022-03-07 DIAGNOSIS — R4189 Other symptoms and signs involving cognitive functions and awareness: Secondary | ICD-10-CM

## 2022-03-07 NOTE — Telephone Encounter (Signed)
Please call to schedule an appt  

## 2022-03-07 NOTE — Telephone Encounter (Signed)
Lvm for patient to call and schedule 

## 2022-03-07 NOTE — Telephone Encounter (Signed)
Please call the patient to schedule an appt.

## 2022-03-07 NOTE — Telephone Encounter (Signed)
Lvm for pt to call and schedule

## 2022-03-10 ENCOUNTER — Other Ambulatory Visit: Payer: Self-pay | Admitting: Physician Assistant

## 2022-03-19 ENCOUNTER — Telehealth: Payer: Self-pay | Admitting: Neurology

## 2022-03-19 NOTE — Telephone Encounter (Signed)
Called pt. Scheduled initial CPAP appointment with pt for 1/17 @ 1:15 pm.

## 2022-03-24 ENCOUNTER — Telehealth: Payer: Self-pay | Admitting: Neurology

## 2022-03-24 NOTE — Telephone Encounter (Signed)
LVM and sent mychart msg informing pt of need to reschedule 12/26 appointment - MD out

## 2022-03-26 ENCOUNTER — Ambulatory Visit (INDEPENDENT_AMBULATORY_CARE_PROVIDER_SITE_OTHER): Payer: Medicare HMO

## 2022-03-26 DIAGNOSIS — I639 Cerebral infarction, unspecified: Secondary | ICD-10-CM | POA: Diagnosis not present

## 2022-03-26 LAB — CUP PACEART REMOTE DEVICE CHECK
Date Time Interrogation Session: 20231031230510
Implantable Pulse Generator Implant Date: 20220126

## 2022-04-03 ENCOUNTER — Other Ambulatory Visit: Payer: Self-pay | Admitting: Behavioral Health

## 2022-04-03 DIAGNOSIS — F411 Generalized anxiety disorder: Secondary | ICD-10-CM

## 2022-04-03 DIAGNOSIS — R630 Anorexia: Secondary | ICD-10-CM

## 2022-04-03 DIAGNOSIS — R4189 Other symptoms and signs involving cognitive functions and awareness: Secondary | ICD-10-CM

## 2022-04-03 DIAGNOSIS — F331 Major depressive disorder, recurrent, moderate: Secondary | ICD-10-CM

## 2022-04-03 NOTE — Telephone Encounter (Signed)
Please call to schedule an appt, past due 

## 2022-04-08 ENCOUNTER — Encounter: Payer: Self-pay | Admitting: Nurse Practitioner

## 2022-04-08 ENCOUNTER — Ambulatory Visit: Payer: Medicare HMO | Admitting: Nurse Practitioner

## 2022-04-08 VITALS — BP 138/84 | HR 64 | Ht 60.0 in | Wt 121.4 lb

## 2022-04-08 DIAGNOSIS — K5900 Constipation, unspecified: Secondary | ICD-10-CM | POA: Diagnosis not present

## 2022-04-08 DIAGNOSIS — K529 Noninfective gastroenteritis and colitis, unspecified: Secondary | ICD-10-CM | POA: Diagnosis not present

## 2022-04-08 NOTE — Progress Notes (Signed)
04/08/2022 [name] [name] [id number] May 12, 1941   Chief Complaint: Diarrhea   History of Present Illness: [name] [name] is a 81 year old female with a past medical history of anxiety, depression, hypertension, hyperlipidemia, multiple CVAs on Brilinta, cognitive impairment, sleep apnea uses CPAP, IBS - D  and colon polyps. Past cholecystectomy. She is known by Dr Henrene Pastor. She presents today for diarrhea follow up. She is accompanied by her daughter. Her diarrhea abated once she started taking Colestid 1gm two tabs po bid. However, she is now having constipations issues therefore her daughter reduced her mother's dose of Colestid to 1gm two tab po QD two days ago. She sometimes strains to pass a BM. Stools are hard and  brown and sometimes she has to manually remove stool from the rectum. No rectal bleeding or black stools.   Colonoscopy 12/18/2014 at [facility]: - One diminutive polyp in the sigmoid colon. Resected and retrieved. - Internal hemorrhoids. - The examination was otherwise normal A. COLON POLYP, SIGMOID; COLD BIOPSY:  - HYPERPLASTIC POLYP.  - NEGATIVE FOR DYSPLASIA AND MALIGNANCY.   Colonoscopy 11/15/2004: Normal colonoscopy      Latest Ref Rng & Units 11/08/2021    3:15 PM 10/26/2020    9:51 PM 08/04/2020    6:20 PM  CBC  WBC 3.8 - 10.8 Thousand/uL 6.9  9.6  6.9   Hemoglobin 11.7 - 15.5 g/dL 14.7  16.1  14.6   Hematocrit 35.0 - 45.0 % 42.5  48.8  44.6   Platelets 140 - 400 Thousand/uL 233  305  278        Latest Ref Rng & Units 11/08/2021    3:15 PM 10/26/2020    9:52 PM 08/04/2020    6:20 PM  CMP  Glucose 65 - 99 mg/dL 84  109  101   BUN 7 - 25 mg/dL '12  15  12   '$ Creatinine 0.60 - 1.00 mg/dL 0.89  0.89  0.88   Sodium 135 - 146 mmol/L 142  134  138   Potassium 3.5 - 5.3 mmol/L 4.1  4.3  3.7   Chloride 98 - 110 mmol/L 107  98  103   CO2 20 - 32 mmol/L '23  25  25   '$ Calcium 8.6 - 10.4 mg/dL 10.1  10.0  9.9   Total Protein 6.1 - 8.1 g/dL 6.2  7.1  6.9   Total Bilirubin  0.2 - 1.2 mg/dL 0.5  1.1  1.0   Alkaline Phos 38 - 126 U/L  146  121   AST 10 - 35 U/L '17  25  26   '$ ALT 6 - 29 U/L '10  21  20   '$ Pancreatic elastase level > 500,  fecal calprotectin level 6, O & P negative and GI pathogen panel negative on 01/24/2022.  CTAP 05/02/2021: 1. There is a fat containing midline epigastric hernia underlying palpable marker measuring 3.9 cm with hernia neck measuring 2.3 cm. 2. Status post cholecystectomy.  Aortic Atherosclerosis   Current Outpatient Medications on File Prior to Visit  Medication Sig Dispense Refill   atenolol (TENORMIN) 25 MG tablet TAKE 1 TABLET BY MOUTH 2 TIMES DAILY. 180 tablet 1   atorvastatin (LIPITOR) 40 MG tablet TAKE ONE TABLET (40 MG) BY MOUTH EVERY DAY 90 tablet 3   Cholecalciferol (VITAMIN D3) 25 MCG (1000 UT) CAPS Take 1 capsule (1,000 Units total) by mouth daily.     colestipol (COLESTID) 1 g tablet TAKE TWO TABLETS BY MOUTH  TWICE DAILY (Patient taking differently: Take 2 g by mouth daily.) 120 tablet 0   donepezil (ARICEPT) 5 MG tablet TAKE 1 TABLET BY MOUTH DAILY. 90 tablet 1   OLANZapine (ZYPREXA) 10 MG tablet Take 1 tablet (10 mg total) by mouth at bedtime. 30 tablet 3   sertraline (ZOLOFT) 100 MG tablet TAKE 2 TABLETS BY MOUTH DAILY. 60 tablet 0   thiamine 100 MG tablet Take 1 tablet (100 mg total) by mouth daily. 90 tablet 1   ticagrelor (BRILINTA) 90 MG TABS tablet Take 1 tablet (90 mg total) by mouth 2 (two) times daily. Via [employer] pt assistance 180 tablet 0   No current facility-administered medications on file prior to visit.   Allergies  Allergen Reactions   Hydrocodone Nausea Only    Noted after surgery, may be able to tolerate with food    Current Medications, Allergies, Past Medical History, Past Surgical History, Family History and Social History were reviewed in [employer] record.  Review of Systems:   Constitutional: Negative for fever, sweats, chills or weight loss.  Respiratory:  Negative for shortness of breath.   Cardiovascular: Negative for chest pain, palpitations and leg swelling.  Gastrointestinal: See HPI.  Musculoskeletal: + Back pain.  Neurological: + Confusion and memory issues. Negative for dizziness, headaches or paresthesias.   Physical Exam: BP 138/84   Pulse 64   Ht 5' (1.524 m)   Wt 121 lb 6 oz (55.1 kg)   BMI 23.70 kg/m   Wt Readings from Last 3 Encounters:  04/08/22 121 lb 6 oz (55.1 kg)  02/06/22 124 lb 9.6 oz (56.5 kg)  01/08/22 119 lb (54 kg)    General: 81 year old female in NAD.  Head: Normocephalic and atraumatic. Eyes: No scleral icterus. Conjunctiva pink . Ears: Normal auditory acuity. Mouth: Dentition intact. No ulcers or lesions.  Lungs: Clear throughout to auscultation. Heart: Regular rate and rhythm, no murmur. Abdomen: Soft, nontender and nondistended. Central upper abdominal hernia palpated, soft and nontender. No hepatomegaly. Normal bowel sounds x 4 quadrants.  Rectal: Deferred.  Musculoskeletal: Symmetrical with no gross deformities. Extremities: No edema. Neurological: Alert oriented x 4. No focal deficits.  Psychological: Alert and cooperative. Normal mood and affect  Assessment and Recommendations:  35) 81 year old female with chronic diarrhea, normal pancreatic elastase and fecal calprotectin levels and negative stool cultures 01/2022. Diarrhea resolved on Colestid 1gm two tabs po bid, recently developed constipation with straining. No rectal bleeding. -Dietary fiber as tolerated -Reduce Colestid to 1gm two tabs po QD (patient prefers to take 2 tabs po QD and not one tab po bid), not to be taken within 4 hours of any other medication. Reduce Colestid to 1 tab daily if constipation persists.  -Increase water intake as tolerated, try to drink 4 to 6 glasses of water daily  -Squatty potty stool recommended  -Glycerin suppository Q HS as needed  -Follow up as needed   2) Epigastric abdominal hernia, asymptomatic

## 2022-04-08 NOTE — Progress Notes (Signed)
Carelink Summary Report / Loop Recorder 

## 2022-04-08 NOTE — Patient Instructions (Addendum)
Increase water intake to 6 glasses daily.  Continue Colestid 2 tabs by mouth daily; not to be taken within 4 hours of any other medications.   Reduce Colestid to 1 tablet daily if constipation worsens.  Glycerin suppository- 1 per rectum at night as needed.  Thank you for trusting me with your gastrointestinal care!   Carl Best, CRNP

## 2022-04-09 NOTE — Progress Notes (Signed)
Assessment and plan reviewed 

## 2022-04-12 IMAGING — MR MR HEAD W/O CM
6 of 7 series · 31 of 48 positions shown · non-contrast
Comparison: 07/13/2019

CLINICAL DATA: Stroke, follow-up; research study

EXAM:
MRI HEAD WITHOUT CONTRAST
TECHNIQUE: Multiplanar, multiecho pulse sequences of the brain and surrounding
structures were obtained without intravenous contrast.

[Series 3: DWI · axial · 3.0mm · 0.94mm/px · z∈[-18,+146]mm · 8 of 112 slices shown]
[im 1/112]
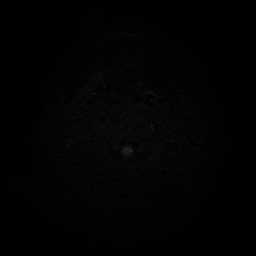
[im 13/112]
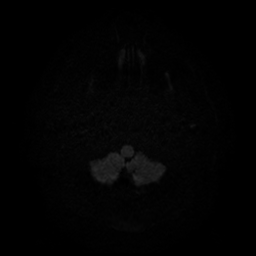
[im 38/112]
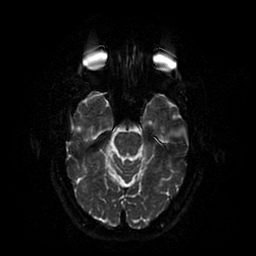
[im 50/112]
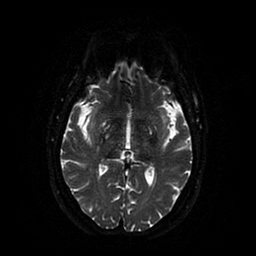
[im 62/112]
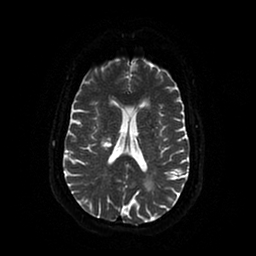
[im 75/112]
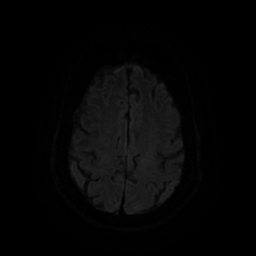
[im 99/112]
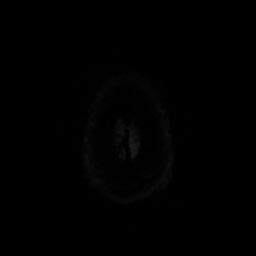
[im 112/112]
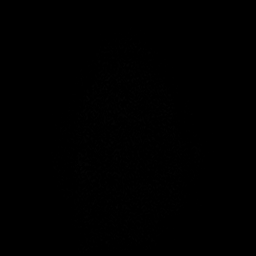

[Series 4: FLAIR · axial · 3.0mm · 0.90mm/px · z∈[-19,+145]mm · 5 of 56 slices shown]
[im 1/56]
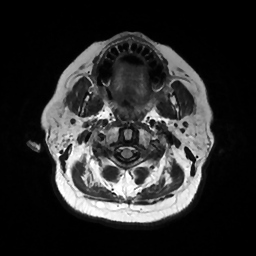
[im 14/56]
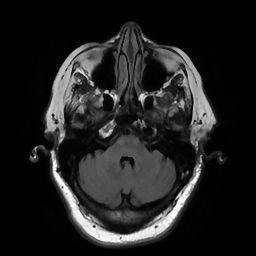
[im 28/56]
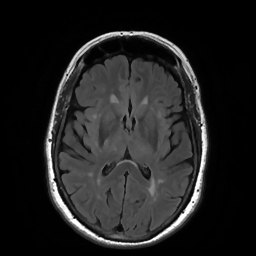
[im 42/56]
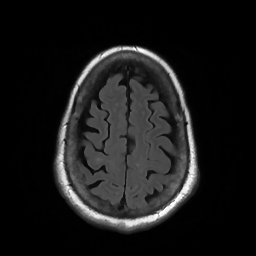
[im 56/56]
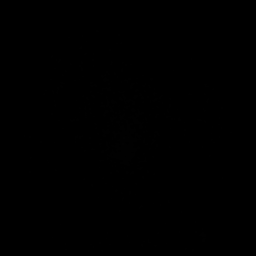

[Series 5: SWI · axial · 3.0mm · 0.47mm/px · z∈[-18,+147]mm · 8 of 112 slices shown (1 of 2)]
[im 1/112]
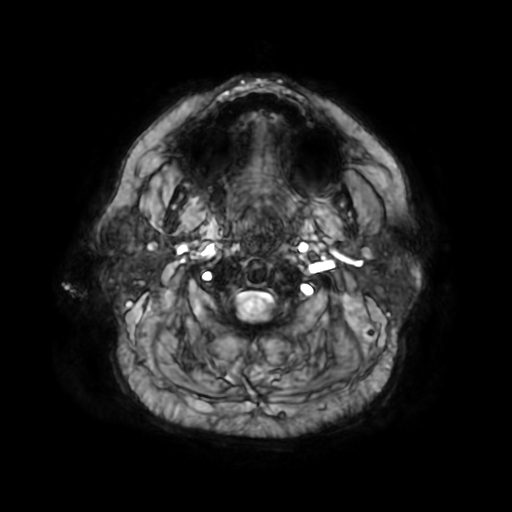
[im 13/112]
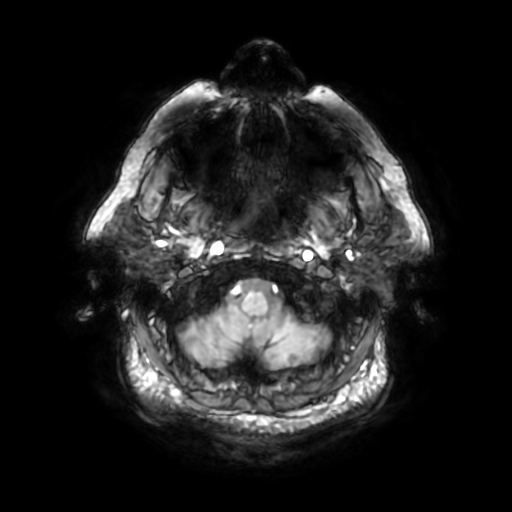
[im 38/112]
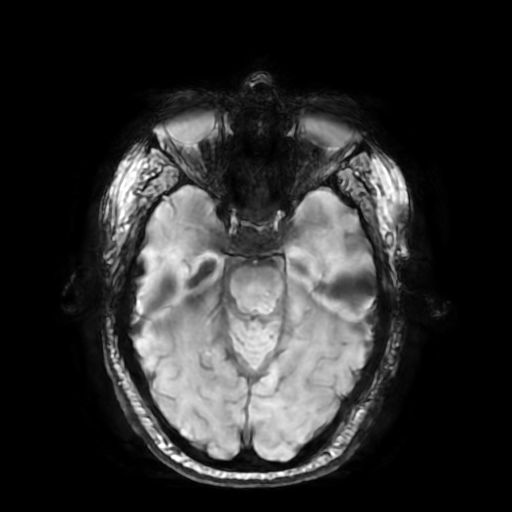
[im 50/112]
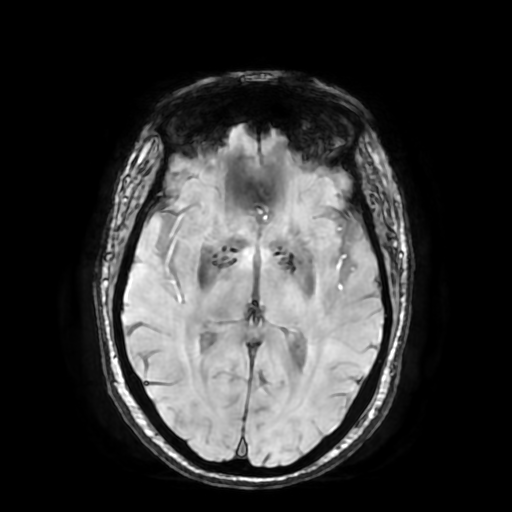
[im 62/112]
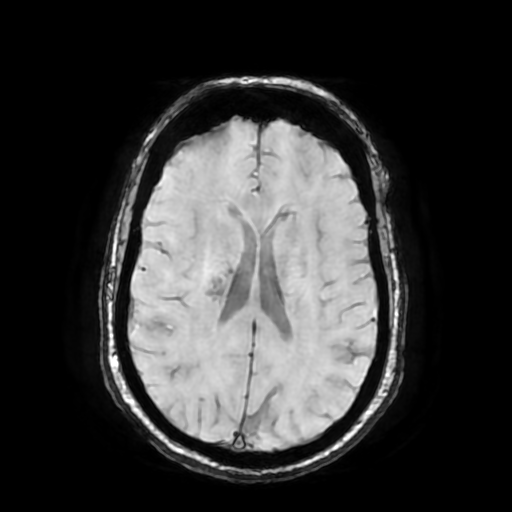
[im 75/112]
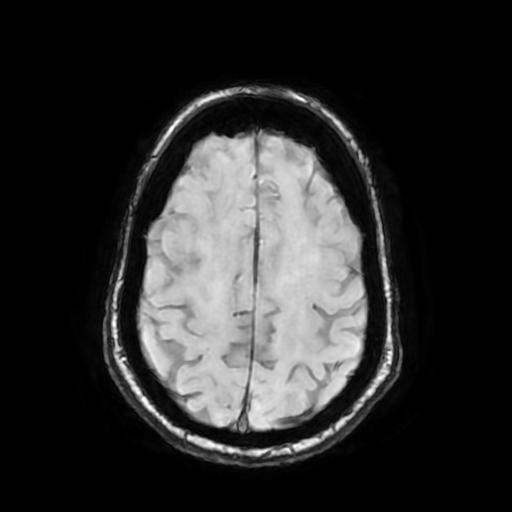
[im 99/112]
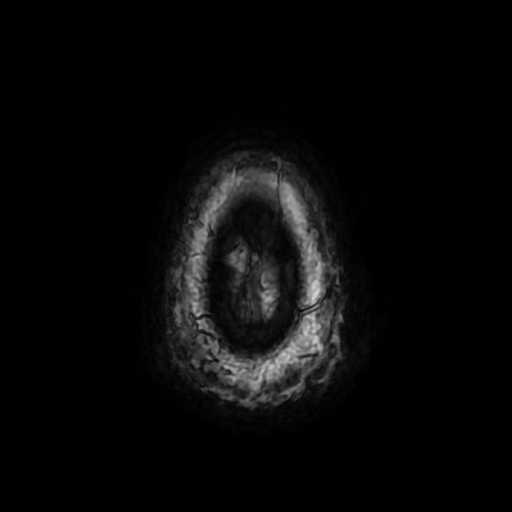
[im 112/112]
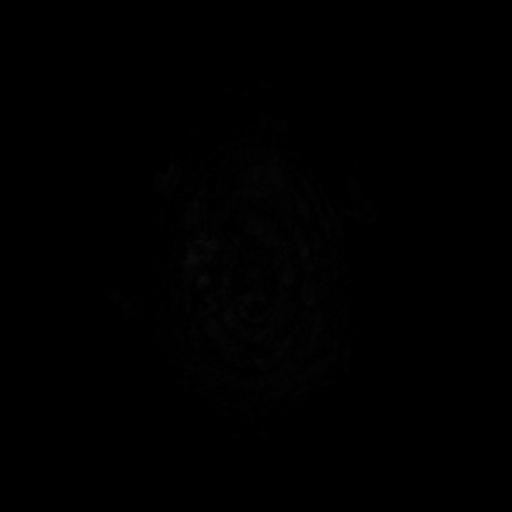

[Series 6: T2 · axial · 5.0mm · 0.47mm/px · z∈[-17,+144]mm · 3 of 28 slices shown]
[im 1/28]
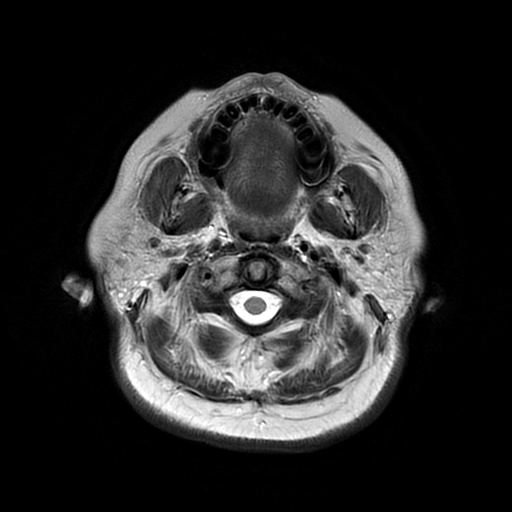
[im 14/28]
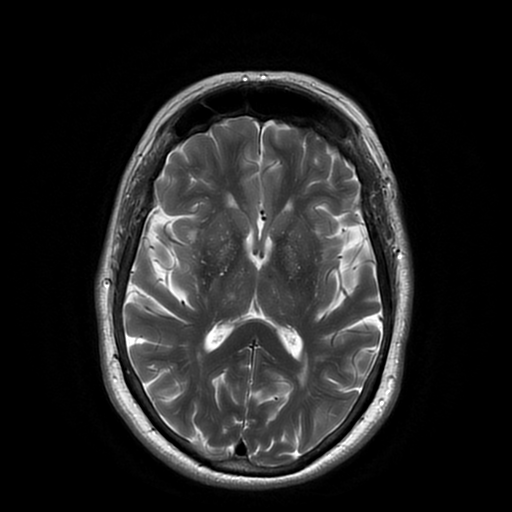
[im 28/28]
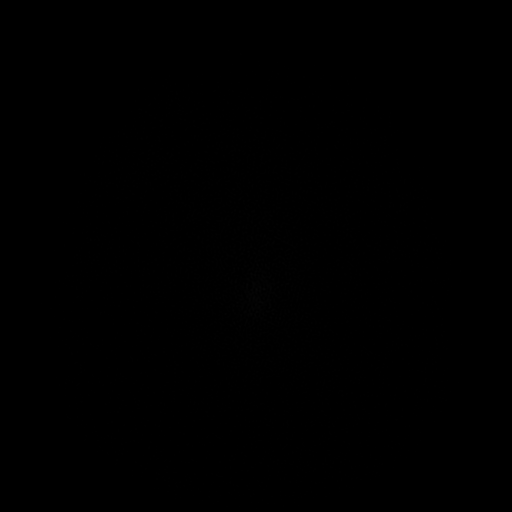

[Series 350: ADC · axial · 3.0mm · 0.94mm/px · z∈[-18,+146]mm · 5 of 56 slices shown]
[im 1/56]
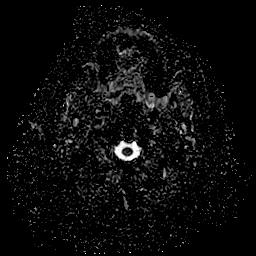
[im 14/56]
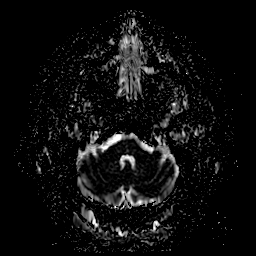
[im 28/56]
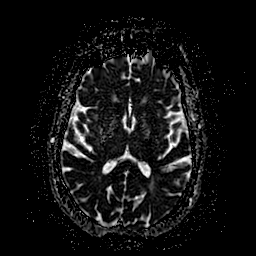
[im 42/56]
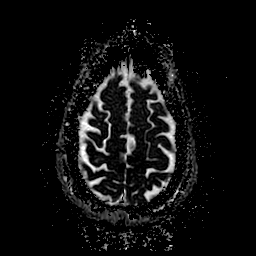
[im 56/56]
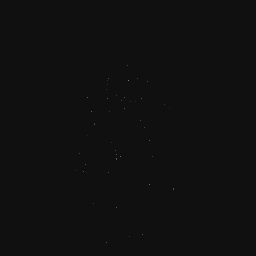

[Series 500: SWI · axial · 3.0mm · 0.47mm/px · z∈[-18,-1]mm · 2 of 109 slices shown (2 of 2)]
[im 1/109]
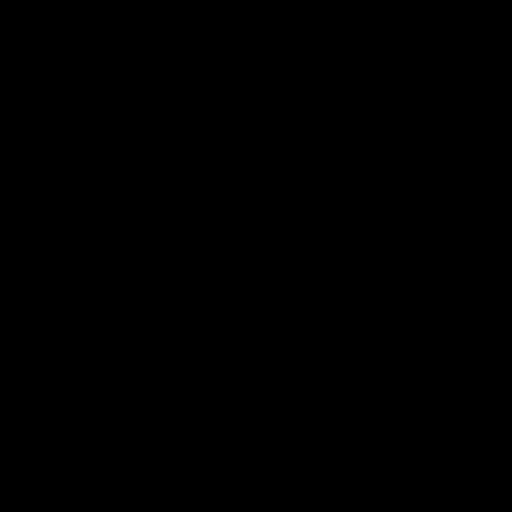
[im 13/109]
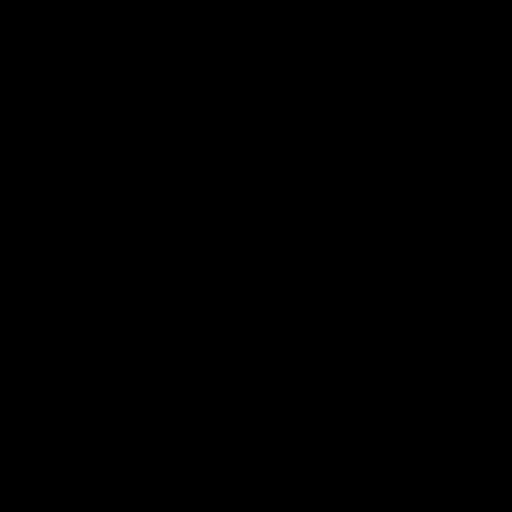

[31 of 48 positions shown; findings below may reference images not displayed]

FINDINGS: Brain: Expected evolution of now chronic infarct involving the right
corona radiata and basal ganglia. Associated susceptibility reflects
chronic blood products, which were not present on the prior study.

There is no acute infarction or intracranial hemorrhage. There is no
intracranial mass, mass effect, or edema. There is no hydrocephalus
or extra-axial fluid collection. Patchy and confluent areas of T2
hyperintensity in the supratentorial and pontine white matter
nonspecific but probably reflects stable chronic microvascular
ischemic changes. Ventricles and sulci are stable in size and
configuration. Punctate focus of susceptibility is again identified
in the left parietal lobe likely reflecting chronic microhemorrhage.

Vascular: Major vessel flow voids at the skull base are preserved.

Skull and upper cervical spine: Normal marrow signal is preserved.

Sinuses/Orbits: Paranasal sinuses are aerated. Orbits are
unremarkable.

Other: Sella is unremarkable.  Mastoid air cells are clear.
IMPRESSION: Expected evolution of now chronic infarct of the right corona
radiata and basal ganglia. There are associated chronic blood
products.

No acute abnormality.

## 2022-04-15 ENCOUNTER — Ambulatory Visit: Payer: Medicare HMO | Admitting: Behavioral Health

## 2022-04-15 ENCOUNTER — Encounter: Payer: Self-pay | Admitting: Behavioral Health

## 2022-04-15 DIAGNOSIS — R630 Anorexia: Secondary | ICD-10-CM

## 2022-04-15 DIAGNOSIS — R4189 Other symptoms and signs involving cognitive functions and awareness: Secondary | ICD-10-CM | POA: Diagnosis not present

## 2022-04-15 DIAGNOSIS — F331 Major depressive disorder, recurrent, moderate: Secondary | ICD-10-CM

## 2022-04-15 DIAGNOSIS — F411 Generalized anxiety disorder: Secondary | ICD-10-CM | POA: Diagnosis not present

## 2022-04-15 MED ORDER — OLANZAPINE 10 MG PO TABS: 10.0000 mg | ORAL_TABLET | Freq: Every day | ORAL | 5 refills | Status: DC

## 2022-04-15 MED ORDER — SERTRALINE HCL 100 MG PO TABS: 200.0000 mg | ORAL_TABLET | Freq: Every day | ORAL | 5 refills | Status: DC

## 2022-04-15 NOTE — Progress Notes (Signed)
[facility]  Patient ID: VALENA IVANOV,  MRN: [id number]  PCP: Tonia Ghent, MD  Date of Evaluation: 04/15/2022 Time spent:30 minutes  Chief Complaint:  Chief Complaint   Depression; Anxiety; Patient Education; Medication Refill; Follow-up     HISTORY/CURRENT STATUS: HPI CHITARA CLONCH presents for follow-up and medication management. She is alert and cognitively sound today. She is smiling and a little brighter today. Daughter is present with her consent and says she has noticed a big difference since we increased Zoloft to 200 mg. They do not want to adjust medication this visit and would like to continue on this regimen. Requesting next f/u in 6 months. Will call sooner if changes occur.   Anxiety level today is 2/10 and depression is 2/10. She is sleeping with aid of medications 7-8 hours per night. No mania or psychosis present. No auditory or visual hallucinations. No paranoia or delusional thinking. No SI/HI.    Individual Medical History/ Review of Systems: Changes? :No   Allergies: Hydrocodone  Current Medications:  Current Outpatient Medications:    atenolol (TENORMIN) 25 MG tablet, TAKE 1 TABLET BY MOUTH 2 TIMES DAILY., Disp: 180 tablet, Rfl: 1   atorvastatin (LIPITOR) 40 MG tablet, TAKE ONE TABLET (40 MG) BY MOUTH EVERY DAY, Disp: 90 tablet, Rfl: 3   Cholecalciferol (VITAMIN D3) 25 MCG (1000 UT) CAPS, Take 1 capsule (1,000 Units total) by mouth daily., Disp: , Rfl:    colestipol (COLESTID) 1 g tablet, TAKE TWO TABLETS BY MOUTH TWICE DAILY (Patient taking differently: Take 2 g by mouth daily.), Disp: 120 tablet, Rfl: 0   donepezil (ARICEPT) 5 MG tablet, TAKE 1 TABLET BY MOUTH DAILY., Disp: 90 tablet, Rfl: 1   OLANZapine (ZYPREXA) 10 MG tablet, Take 1 tablet (10 mg total) by mouth at bedtime., Disp: 30 tablet, Rfl: 5   sertraline (ZOLOFT) 100 MG tablet, Take 2 tablets (200 mg total) by mouth daily., Disp: 60 tablet, Rfl: 5   thiamine 100 MG tablet, Take  1 tablet (100 mg total) by mouth daily., Disp: 90 tablet, Rfl: 1   ticagrelor (BRILINTA) 90 MG TABS tablet, Take 1 tablet (90 mg total) by mouth 2 (two) times daily. Via [employer] pt assistance, Disp: 180 tablet, Rfl: 0 Medication Side Effects: none  Family Medical/ Social History: Changes? No  MENTAL HEALTH EXAM:  There were no vitals taken for this visit.There is no height or weight on file to calculate BMI.  General Appearance: Casual, Neat, and Well Groomed  Eye Contact:  Good  Speech:  Clear and Coherent  Volume:  Normal  Mood:  Anxious  Affect:  Appropriate  Thought Process:  Coherent  Orientation:  Full (Time, Place, and Person)  Thought Content: Logical   Suicidal Thoughts:  No  Homicidal Thoughts:  No  Memory:  WNL  Judgement:  Good  Insight:  Fair  Psychomotor Activity:  Normal  Concentration:  Concentration: Fair  Recall:  Good  Fund of Knowledge: Good  Language: Good  Assets:  Desire for Improvement  ADL's:  Intact  Cognition: WNL  Prognosis:  Good    DIAGNOSES:    ICD-10-CM   1. Major depressive disorder, recurrent episode, moderate (HCC)  F33.1 sertraline (ZOLOFT) 100 MG tablet    OLANZapine (ZYPREXA) 10 MG tablet    2. Generalized anxiety disorder  F41.1 sertraline (ZOLOFT) 100 MG tablet    OLANZapine (ZYPREXA) 10 MG tablet    3. Lack of appetite  R63.0 OLANZapine (ZYPREXA) 10 MG tablet  4. Cognitive decline  R41.89 OLANZapine (ZYPREXA) 10 MG tablet      Receiving Psychotherapy: No    RECOMMENDATIONS:   To continue  Zyprexa 10 mg daily To increase Zoloft  to 200 mg daily. Will report worsening symptoms, acute further decline in cognition or behaviors. Provided after hours emergency contact information Discussed potential metabolic side effects associated with atypical antipsychotics, as well as potential risk for movement side effects. Advised pt to contact office if movement side effects occur.  Greater than 50% of 30  min. face to face time  with patient was spent on counseling and coordination of care. No changes this visit.  Pt and daughter are very happy with medication right now. We discussed her recent improvement with anxiety and depression. Paranoia and delusional thinking has been absent for several months now.  Daughter indicated that no medication changes desired at this time. Dicussed the possibility of lowering her Abilify to 5 mg next visit if she is still stable. Discussed potential metabolic side effects associated with atypical antipsychotics, as well as potential risk for movement side effects. Advised pt to contact office if movement side effects occur.      Elwanda Brooklyn, NP

## 2022-04-25 ENCOUNTER — Telehealth: Payer: Self-pay

## 2022-04-25 DIAGNOSIS — Z8673 Personal history of transient ischemic attack (TIA), and cerebral infarction without residual deficits: Secondary | ICD-10-CM

## 2022-04-25 MED ORDER — TICAGRELOR 90 MG PO TABS: 90.0000 mg | ORAL_TABLET | Freq: Two times a day (BID) | ORAL | 1 refills | Status: DC

## 2022-04-25 NOTE — Chronic Care Management (AMB) (Signed)
Forms complete for renewal of Brilinta from [employer] [place]  upcoming year 2024. Sent for review.  [name], CPP notified  [name], [place]  [phone]

## 2022-04-25 NOTE — Telephone Encounter (Signed)
Contacted the manufacturer to verify renewal of Brilinta '90mg'$  patient assistance for the year 2024. Patient has been approved for 2024. Manufacturer states prescription is needed for the Brilinta '90mg'$   take 2 times daily.Fax # [phone], or E-RX to [facility].  [name], CPP notified  [name], [place]  [phone]

## 2022-04-25 NOTE — Addendum Note (Signed)
Addended by: Sherrilee Gilles B on: 04/25/2022 03:55 PM   Modules accepted: Orders

## 2022-04-25 NOTE — Telephone Encounter (Signed)
Noted.  Refill needed for Brilinta. Routing to refill pool.  Pharmcy: [facility] - [place], [place] [address] [place] [address] Phone: [phone] Fax: [phone]

## 2022-04-25 NOTE — Telephone Encounter (Signed)
Reviewed [employer] provider portal website - it says patient has already been successfully re-enrolled in the program. Will follow up with [employer] by phone to ensure enrollment for 2024.

## 2022-04-25 NOTE — Telephone Encounter (Signed)
ERX sent.  

## 2022-04-28 ENCOUNTER — Other Ambulatory Visit: Payer: Self-pay | Admitting: Physician Assistant

## 2022-04-28 ENCOUNTER — Ambulatory Visit (INDEPENDENT_AMBULATORY_CARE_PROVIDER_SITE_OTHER): Payer: Medicare HMO

## 2022-04-28 DIAGNOSIS — I639 Cerebral infarction, unspecified: Secondary | ICD-10-CM | POA: Diagnosis not present

## 2022-04-28 LAB — CUP PACEART REMOTE DEVICE CHECK
Date Time Interrogation Session: 20231203230741
Implantable Pulse Generator Implant Date: 20220126

## 2022-05-05 ENCOUNTER — Telehealth: Payer: Self-pay | Admitting: Neurology

## 2022-05-05 NOTE — Telephone Encounter (Signed)
LVM and sent mychart msg informing pt of need to reschedule 06/11/22 appointment- MD out

## 2022-05-14 ENCOUNTER — Ambulatory Visit (INDEPENDENT_AMBULATORY_CARE_PROVIDER_SITE_OTHER): Payer: Medicare HMO

## 2022-05-14 VITALS — Ht 60.0 in | Wt 121.0 lb

## 2022-05-14 DIAGNOSIS — Z0000 Encounter for general adult medical examination without abnormal findings: Secondary | ICD-10-CM | POA: Diagnosis not present

## 2022-05-14 NOTE — Patient Instructions (Addendum)
[name] [name] , Thank you for taking time to come for your Medicare Wellness Visit. I appreciate your ongoing commitment to your health goals. Please review the following plan we discussed and let me know if I can assist you in the future.   These are the goals we discussed:  Goals Addressed             This Visit's Progress    Maintain healthy lifestyle       Stay active Healthy diet Stay hydrated         This is a list of the screening recommended for you and due dates:  Health Maintenance  Topic Date Due   COVID-19 Vaccine (3 - Pfizer risk series) 05/30/2022*   Mammogram  07/25/2022*   Hepatitis C Screening: USPSTF Recommendation to screen - Ages 18-79 yo.  07/25/2022*   Zoster (Shingles) Vaccine (1 of 2) 08/13/2022*   Flu Shot  08/24/2022*   Pneumonia Vaccine (3 - PPSV23 or PCV20) 05/15/2023*   Medicare Annual Wellness Visit  05/15/2023   DTaP/Tdap/Td vaccine (3 - Td or Tdap) 01/17/2026   DEXA scan (bone density measurement)  Completed   HPV Vaccine  Aged Out  *Topic was postponed. The date shown is not the original due date.    Advanced directives: on file  Conditions/risks identified: none new  Next appointment: Follow up in one year for your annual wellness visit    Preventive Care 65 Years and Older, Female Preventive care refers to lifestyle choices and visits with your health care provider that can promote health and wellness. What does preventive care include? A yearly physical exam. This is also called an annual well check. Dental exams once or twice a year. Routine eye exams. Ask your health care provider how often you should have your eyes checked. Personal lifestyle choices, including: Daily care of your teeth and gums. Regular physical activity. Eating a healthy diet. Avoiding tobacco and drug use. Limiting alcohol use. Practicing safe sex. Taking low-dose aspirin every day. Taking vitamin and mineral supplements as recommended by your health care  provider. What happens during an annual well check? The services and screenings done by your health care provider during your annual well check will depend on your age, overall health, lifestyle risk factors, and family history of disease. Counseling  Your health care provider may ask you questions about your: Alcohol use. Tobacco use. Drug use. Emotional well-being. Home and relationship well-being. Sexual activity. Eating habits. History of falls. Memory and ability to understand (cognition). Work and work Statistician. Reproductive health. Screening  You may have the following tests or measurements: Height, weight, and BMI. Blood pressure. Lipid and cholesterol levels. These may be checked every 5 years, or more frequently if you are over 5 years old. Skin check. Lung cancer screening. You may have this screening every year starting at age 66 if you have a 30-pack-year history of smoking and currently smoke or have quit within the past 15 years. Fecal occult blood test (FOBT) of the stool. You may have this test every year starting at age 30. Flexible sigmoidoscopy or colonoscopy. You may have a sigmoidoscopy every 5 years or a colonoscopy every 10 years starting at age 47. Hepatitis C blood test. Hepatitis B blood test. Sexually transmitted disease (STD) testing. Diabetes screening. This is done by checking your blood sugar (glucose) after you have not eaten for a while (fasting). You may have this done every 1-3 years. Bone density scan. This is done to screen  for osteoporosis. You may have this done starting at age 7. Mammogram. This may be done every 1-2 years. Talk to your health care provider about how often you should have regular mammograms. Talk with your health care provider about your test results, treatment options, and if necessary, the need for more tests. Vaccines  Your health care provider may recommend certain vaccines, such as: Influenza vaccine. This is  recommended every year. Tetanus, diphtheria, and acellular pertussis (Tdap, Td) vaccine. You may need a Td booster every 10 years. Zoster vaccine. You may need this after age 73. Pneumococcal 13-valent conjugate (PCV13) vaccine. One dose is recommended after age 44. Pneumococcal polysaccharide (PPSV23) vaccine. One dose is recommended after age 81. Talk to your health care provider about which screenings and vaccines you need and how often you need them. This information is not intended to replace advice given to you by your health care provider. Make sure you discuss any questions you have with your health care provider. Document Released: 06/08/2015 Document Revised: 01/30/2016 Document Reviewed: 03/13/2015 Elsevier Interactive Patient Education  2017 [place] Prevention in the Home Falls can cause injuries. They can happen to people of all ages. There are many things you can do to make your home safe and to help prevent falls. What can I do on the outside of my home? Regularly fix the edges of walkways and driveways and fix any cracks. Remove anything that might make you trip as you walk through a door, such as a raised step or threshold. Trim any bushes or trees on the path to your home. Use bright outdoor lighting. Clear any walking paths of anything that might make someone trip, such as rocks or tools. Regularly check to see if handrails are loose or broken. Make sure that both sides of any steps have handrails. Any raised decks and porches should have guardrails on the edges. Have any leaves, snow, or ice cleared regularly. Use sand or salt on walking paths during winter. Clean up any spills in your garage right away. This includes oil or grease spills. What can I do in the bathroom? Use night lights. Install grab bars by the toilet and in the tub and shower. Do not use towel bars as grab bars. Use non-skid mats or decals in the tub or shower. If you need to sit down in  the shower, use a plastic, non-slip stool. Keep the floor dry. Clean up any water that spills on the floor as soon as it happens. Remove soap buildup in the tub or shower regularly. Attach bath mats securely with double-sided non-slip rug tape. Do not have throw rugs and other things on the floor that can make you trip. What can I do in the bedroom? Use night lights. Make sure that you have a light by your bed that is easy to reach. Do not use any sheets or blankets that are too big for your bed. They should not hang down onto the floor. Have a firm chair that has side arms. You can use this for support while you get dressed. Do not have throw rugs and other things on the floor that can make you trip. What can I do in the kitchen? Clean up any spills right away. Avoid walking on wet floors. Keep items that you use a lot in easy-to-reach places. If you need to reach something above you, use a strong step stool that has a grab bar. Keep electrical cords out of the way. Do  not use floor polish or wax that makes floors slippery. If you must use wax, use non-skid floor wax. Do not have throw rugs and other things on the floor that can make you trip. What can I do with my stairs? Do not leave any items on the stairs. Make sure that there are handrails on both sides of the stairs and use them. Fix handrails that are broken or loose. Make sure that handrails are as long as the stairways. Check any carpeting to make sure that it is firmly attached to the stairs. Fix any carpet that is loose or worn. Avoid having throw rugs at the top or bottom of the stairs. If you do have throw rugs, attach them to the floor with carpet tape. Make sure that you have a light switch at the top of the stairs and the bottom of the stairs. If you do not have them, ask someone to add them for you. What else can I do to help prevent falls? Wear shoes that: Do not have high heels. Have rubber bottoms. Are comfortable  and fit you well. Are closed at the toe. Do not wear sandals. If you use a stepladder: Make sure that it is fully opened. Do not climb a closed stepladder. Make sure that both sides of the stepladder are locked into place. Ask someone to hold it for you, if possible. Clearly mark and make sure that you can see: Any grab bars or handrails. First and last steps. Where the edge of each step is. Use tools that help you move around (mobility aids) if they are needed. These include: Canes. Walkers. Scooters. Crutches. Turn on the lights when you go into a dark area. Replace any light bulbs as soon as they burn out. Set up your furniture so you have a clear path. Avoid moving your furniture around. If any of your floors are uneven, fix them. If there are any pets around you, be aware of where they are. Review your medicines with your doctor. Some medicines can make you feel dizzy. This can increase your chance of falling. Ask your doctor what other things that you can do to help prevent falls. This information is not intended to replace advice given to you by your health care provider. Make sure you discuss any questions you have with your health care provider. Document Released: 03/08/2009 Document Revised: 10/18/2015 Document Reviewed: 06/16/2014 Elsevier Interactive Patient Education  2017 [employer].

## 2022-05-14 NOTE — Progress Notes (Signed)
Subjective:   [name] [name] is a 81 y.o. female who presents for Medicare Annual (Subsequent) preventive examination.  Review of Systems    No ROS.  Medicare Wellness Virtual Visit.  Visual/audio telehealth visit, UTA vital signs.   See social history for additional risk factors.   Cardiac Risk Factors include: advanced age (>26mn, >>26women);hypertension     Objective:    Today's Vitals   05/14/22 1537  Weight: 121 lb (54.9 kg)  Height: 5' (1.524 m)   Body mass index is 23.63 kg/m.     05/14/2022    3:07 PM 04/30/2021    9:49 AM 10/26/2020    9:53 PM 04/18/2020    9:50 AM 02/17/2020    3:00 AM 12/07/2019   10:28 AM 11/02/2019    9:51 AM  Advanced Directives  Does Patient Have a Medical Advance Directive? Yes Yes No Yes Yes Yes Yes  Type of AParamedicof ASilver PeakLiving will HTahlequahLiving will  HCloverlyLiving will [place]ila[place]aLiving will  [place]oklyn ParkLiving will  Does patient want to make changes to medical advance directive? No - Patient declined Yes (MAU/Ambulatory/Procedural Areas - Information given)  No - Patient declined No - Patient declined Yes (Inpatient - patient defers changing a medical advance directive at this time - Information given)   Copy of HJeffersontownin Chart? Yes - validated most recent copy scanned in chart (See row information)   No - copy requested No - copy requested  No - copy requested    Current Medications (verified) Outpatient Encounter Medications as of 05/14/2022  Medication Sig   atenolol (TENORMIN) 25 MG tablet TAKE 1 TABLET BY MOUTH 2 TIMES DAILY.   atorvastatin (LIPITOR) 40 MG tablet TAKE ONE TABLET (40 MG) BY MOUTH EVERY DAY   Cholecalciferol (VITAMIN D3) 25 MCG (1000 UT) CAPS Take 1 capsule (1,000 Units total) by mouth daily.   colestipol (COLESTID) 1 g tablet TAKE TWO TABLETS TWICE A DAY   donepezil (ARICEPT) 5 MG  tablet TAKE 1 TABLET BY MOUTH DAILY.   OLANZapine (ZYPREXA) 10 MG tablet Take 1 tablet (10 mg total) by mouth at bedtime.   sertraline (ZOLOFT) 100 MG tablet Take 2 tablets (200 mg total) by mouth daily.   thiamine 100 MG tablet Take 1 tablet (100 mg total) by mouth daily.   ticagrelor (BRILINTA) 90 MG TABS tablet Take 1 tablet (90 mg total) by mouth 2 (two) times daily. Via [place] pt assistance   No facility-administered encounter medications on file as of 05/14/2022.    Allergies (verified) Hydrocodone   History: Past Medical History:  Diagnosis Date   Arthritis    knees   Depression    Fatty liver    GERD (gastroesophageal reflux disease)    Hyperlipidemia    Hypertension    IBS (irritable bowel syndrome)    Obesity    Pneumonia    Sleep apnea    uses CPAP   Stroke (cerebrum) ([facility]    Past Surgical History:  Procedure Laterality Date   BLADDER SURGERY     BREAST SURGERY     breast biopsy   BROW LIFT Bilateral 04/14/2017   Procedure: BLEPHAROPLASTY UPPER EYELID WITH EXCESS SKIN;  Surgeon: FKarle Starch MD;  [place]  Service: Ophthalmology;  Laterality: Bilateral;   CATARACT EXTRACTION W/PHACO Left 02/05/2016   Procedure: CATARACT EXTRACTION PHACO AND INTRAOCULAR LENS PLACEMENT ([place];  Surgeon: Birder Robson, MD;  Location: [facility] ORS;  Service: Ophthalmology;  Laterality: Left;  [place] 01:10 AP% 22.3 CDE 15.67 Fluid pack lot # [id number]   CATARACT EXTRACTION W/PHACO Right 02/26/2016   Procedure: CATARACT EXTRACTION PHACO AND INTRAOCULAR LENS PLACEMENT ([facility]);  Surgeon: Birder Robson, MD;  Location: [facility] ORS;  Service: Ophthalmology;  Laterality: Right;  [place] 57.4 AP% 24.0 CDE 13.75 Fluid Pack lot # [id number]   CHOLECYSTECTOMY     COLONOSCOPY WITH PROPOFOL N/A 12/18/2014   Procedure: COLONOSCOPY WITH PROPOFOL;  Surgeon: Manya Silvas, MD;  Location: [facility] ENDOSCOPY;  Service: Endoscopy;  Laterality: N/A;   DEEP NECK LYMPH NODE BIOPSY / EXCISION      implantable loop recorder implantation  06/20/2020   Medtronic Reveal Linq model LNQ 22 ([place] [id number]) implantable loop recorder for cryptogenic stroke   JOINT REPLACEMENT     KNEE ARTHROPLASTY Left 06/20/2015   Procedure: COMPUTER ASSISTED TOTAL KNEE ARTHROPLASTY;  Surgeon: Dereck Leep, MD;  Location: [facility] ORS;  Service: Orthopedics;  Laterality: Left;   PTOSIS REPAIR Bilateral 04/14/2017   Procedure: PTOSIS REPAIR RESECT EX;  Surgeon: Karle Starch, MD;  Location: [facility];  Service: Ophthalmology;  Laterality: Bilateral;  sleep apnea   TONSILLECTOMY     TOTAL HIP ARTHROPLASTY Right 07/21/2018   Procedure: TOTAL HIP ARTHROPLASTY ANTERIOR APPROACH;  Surgeon: Gaynelle Arabian, MD;  Location: [facility];  Service: Orthopedics;  Laterality: Right;   Family History  Problem Relation Age of Onset   Heart attack Mother    Dementia Father    Heart attack Father 54   Aortic aneurysm Brother    Stroke Paternal Grandfather    Colon cancer Neg Hx    Breast cancer Neg Hx    Social History   Socioeconomic History   Marital status: Widowed    Spouse name: Not on file   Number of children: 3   Years of education: 1   Highest education level: Not on file  Occupational History   Occupation: retired  Tobacco Use   Smoking status: Never   Smokeless tobacco: Never  Vaping Use   Vaping Use: Never used  Substance and Sexual Activity   Alcohol use: Not Currently    Comment: ocassional glass of wine 2-3 times a year   Drug use: No   Sexual activity: Not Currently  Other Topics Concern   Not on file  Social History Narrative   Lives alone in home   Right Handed   Drinks 1-2 cups caffeine daily   Social Determinants of Health   Financial Resource Strain: Low Risk  (05/14/2022)   Overall Financial Resource Strain (CARDIA)    Difficulty of Paying Living Expenses: Not very hard  Food Insecurity: No Food Insecurity (05/14/2022)   Hunger Vital Sign    Worried About Running Out of  Food in the Last Year: Never true    [place] in the Last Year: Never true  Transportation Needs: No Transportation Needs (05/14/2022)   PRAPARE - Hydrologist (Medical): No    Lack of Transportation (Non-Medical): No  Physical Activity: Insufficiently Active (05/14/2022)   Exercise Vital Sign    Days of Exercise per Week: 3 days    Minutes of Exercise per Session: 10 min  Stress: No Stress Concern Present (05/14/2022)   Barry    Feeling of Stress : Not at all  Social Connections: Socially Isolated (05/14/2022)  Social Licensed conveyancer [NHANES]    Frequency of Communication with Friends and Family: More than three times a week    Frequency of Social Gatherings with Friends and Family: More than three times a week    Attends Religious Services: Never    Marine scientist or Organizations: No    Attends Archivist Meetings: Never    Marital Status: Widowed    Tobacco Counseling Counseling given: Not Answered   Clinical Intake:  Pre-visit preparation completed: Yes        Diabetes: No  How often do you need to have someone help you when you read instructions, pamphlets, or other written materials from your doctor or pharmacy?: 3 - Sometimes    Interpreter Needed?: No      Activities of Daily Living    05/14/2022    3:10 PM  In your present state of health, do you have any difficulty performing the following activities:  Hearing? 0  Vision? 0  Difficulty concentrating or making decisions? 1  Walking or climbing stairs? 1  Dressing or bathing? 0  Doing errands, shopping? 1  Comment [place] and eating ? N  Using the Toilet? N  In the past six months, have you accidently leaked urine? N  Comment Managed by PCP and Gertie Fey  Do you have problems with loss of bowel control? N  Managing your Medications? Y   Comment Daughter assist  Managing your Finances? Y  Comment Daughter assist  Housekeeping or managing your Housekeeping? N    Patient Care Team: Tonia Ghent, MD as PCP - General (Family Medicine) Birder Robson, MD as Referring Physician (Ophthalmology) Marry Guan Laurice Record, MD as Consulting Physician (Orthopedic Surgery) Garvin Fila, MD as Consulting Physician (Neurology) Charlton Haws, [facility] as Pharmacist (Pharmacist)  Indicate any recent Medical Services you may have received from other than [facility] providers in the past year (date may be approximate).     Assessment:   This is a routine wellness examination for Gayanne.  I connected with  Norman Herrlich on 05/14/22 by a audio enabled telemedicine application and verified that I am speaking with the correct person using two identifiers.  Patient Location: Home  Provider Location: Office/Clinic  I discussed the limitations of evaluation and management by telemedicine. The patient expressed understanding and agreed to proceed.   Hearing/Vision screen Hearing Screening - Comments:: Patient is able to hear conversational tones without difficulty.  No issues reported.   Vision Screening - Comments:: Last exam 2021, Dr. Georgette Dover, plans to make an appointment.  Agrees to plan to schedule.   Dietary issues and exercise activities discussed: Current Exercise Habits: Home exercise routine, Intensity: Mild   Goals Addressed             This Visit's Progress    Maintain healthy lifestyle       Stay active Healthy diet Stay hydrated       Depression Screen    05/14/2022    3:10 PM 04/15/2022    3:36 PM 04/30/2021    9:53 AM 01/24/2021   12:09 PM 07/26/2019   11:44 AM 12/16/2018    2:10 PM 08/17/2017    9:57 AM  PHQ 2/9 Scores  PHQ - 2 Score 0  0 6 1 0 0  PHQ- 9 Score    20   0     Information is confidential and restricted. Go to Review Flowsheets to unlock data.  Fall Risk    05/14/2022    3:09 PM  04/30/2021    9:51 AM 12/16/2018    2:09 PM 08/17/2017    9:57 AM 08/21/2016    9:59 AM  Fall Risk   Falls in the past year? 0 1 0 No No  Number falls in past yr: 0 0     Injury with Fall? 0 0     Comment  Patient states falling in the shower while inpatient @ [facility] for fall due to :  Other (Comment)     Risk for fall due to: Comment Cane/walker in use fell in the shower     Follow up Falls evaluation completed;Falls prevention discussed;Education provided Falls prevention discussed       FALL RISK PREVENTION PERTAINING TO THE HOME: Home free of loose throw rugs in walkways, pet beds, electrical cords, etc? Yes  Adequate lighting in your home to reduce risk of falls? Yes   ASSISTIVE DEVICES UTILIZED TO PREVENT FALLS: Life alert? Yes  Use of a cane, walker or w/c? Yes  Grab bars in the bathroom? Yes  Shower chair or bench in shower? Yes  Comfort chair height toilet? No   TIMED UP AND GO: Was the test performed? No .   Cognitive Function: Patient is alert. Followed by Neurology.  Taking medication as directed.      05/14/2022    3:33 PM 02/06/2022    1:12 PM 08/06/2021   12:41 PM 02/04/2021   10:38 AM 08/17/2017    9:58 AM  MMSE - Mini Mental State Exam  Not completed: Unable to complete      Orientation to time  '3 5 5 5  '$ Orientation to Place  '4 4 5 5  '$ Registration  '3 3 3 3  '$ Attention/ Calculation  '2 3 2 '$ 0  Recall  '3 2 1 3  '$ Language- name 2 objects  '2 2 2 '$ 0  Language- repeat  0 '1 1 1  '$ Language- follow 3 step command  '3 3 3 3  '$ Language- read & follow direction  '1 1 1 '$ 0  Write a sentence  '1 1 1 '$ 0  Copy design  1 0 1 0  Total score  '23 25 25 20        '$ Immunizations Immunization History  Administered Date(s) Administered   Influenza Inj Mdck Quad Pf 03/16/2018   Influenza Whole 05/09/2004   Influenza, High Dose Seasonal PF 04/01/2017, 03/23/2019   Influenza, Seasonal, Injecte, Preservative Fre 03/09/2016   Influenza,inj,Quad PF,6+ Mos 02/06/2014    Influenza,inj,quad, With Preservative 03/31/2017   Influenza-Unspecified 01/24/2014, 03/16/2018, 03/23/2019   PFIZER(Purple Top)SARS-COV-2 Vaccination 06/06/2019, 06/25/2019   PPD Test 01/09/2021   Pneumococcal Conjugate-13 04/25/2014   Pneumococcal Polysaccharide-23 03/30/2007   Pneumococcal-Unspecified 09/17/2007   Td 10/24/2005   Tdap 01/18/2016   Zoster, Live 05/26/2008   Flu Vaccine status: Due, Education has been provided regarding the importance of this vaccine. Advised may receive this vaccine at local pharmacy or Health Dept. Aware to provide a copy of the vaccination record if obtained from local pharmacy or Health Dept. Verbalized acceptance and understanding.  Pneumococcal vaccine status: Due, Education has been provided regarding the importance of this vaccine. Advised may receive this vaccine at local pharmacy or Health Dept. Aware to provide a copy of the vaccination record if obtained from local pharmacy or Health Dept. Verbalized acceptance and understanding.  Covid-19 vaccine status: Completed vaccines x2.  Shingrix Completed?: No.  Education has been provided regarding the importance of this vaccine. Patient has been advised to call insurance company to determine out of pocket expense if they have not yet received this vaccine. Advised may also receive vaccine at local pharmacy or Health Dept. Verbalized acceptance and understanding.  Screening Tests Health Maintenance  Topic Date Due   COVID-19 Vaccine (3 - Pfizer risk series) 05/30/2022 (Originally 07/23/2019)   MAMMOGRAM  07/25/2022 (Originally 01/11/2021)   Hepatitis C Screening  07/25/2022 (Originally 09/16/1958)   Zoster Vaccines- Shingrix (1 of 2) 08/13/2022 (Originally 09/16/1959)   INFLUENZA VACCINE  08/24/2022 (Originally 12/24/2021)   Pneumonia Vaccine 32+ Years old (3 - PPSV23 or PCV20) 05/15/2023 (Originally 04/26/2019)   Medicare Annual Wellness (AWV)  05/15/2023   DTaP/Tdap/Td (3 - Td or Tdap) 01/17/2026    DEXA SCAN  Completed   HPV VACCINES  Aged Out   Health Maintenance There are no preventive care reminders to display for this patient.  Mammogram- deferred per patient.   Lung Cancer Screening: (Low Dose CT Chest recommended if Age 28-80 years, 30 pack-year currently smoking OR have quit w/in 15years.) does not qualify.   Hepatitis C Screening: does not qualify.  Vision Screening: Recommended annual ophthalmology exams for early detection of glaucoma and other disorders of the eye.  Dental Screening: Recommended annual dental exams for proper oral hygiene.  Community Resource Referral / Chronic Care Management: CRR required this visit?  No   CCM required this visit?  No      Plan:     I have personally reviewed and noted the following in the patient's chart:   Medical and social history Use of alcohol, tobacco or illicit drugs  Current medications and supplements including opioid prescriptions. Patient is not currently taking opioid prescriptions. Functional ability and status Nutritional status Physical activity Advanced directives List of other physicians Hospitalizations, surgeries, and ER visits in previous 12 months Vitals Screenings to include cognitive, depression, and falls Referrals and appointments  In addition, I have reviewed and discussed with patient certain preventive protocols, quality metrics, and best practice recommendations. A written personalized care plan for preventive services as well as general preventive health recommendations were provided to patient.     Leta Jungling, LPN   [id number]

## 2022-05-20 ENCOUNTER — Ambulatory Visit: Payer: Medicare HMO | Admitting: Neurology

## 2022-05-20 NOTE — Progress Notes (Unsigned)
[facility] [address] [place]. [place] [address] [phone]       STROKE FOLLOW UP NOTE  [name] Date of Birth:  Oct 20, 1940 Medical Record Number:  [id number]   Primary neurologist: Dr. Leonie [name] Sleep neurologist: Dr. Rexene [name] Reason for visit: OSA on CPAP, hx of stroke and cognitive impairment    SUBJECTIVE:   CHIEF COMPLAINT:  Chief Complaint  Patient presents with   Follow-up    Pt in room #3 with her daughter.  Pt here toady for f/u on her stroke and Sleep apnea.     HPI:   Update 05/21/2022 JM: Patient returns for initial BiPAP compliance visit.  She is accompanied by her daughter.  Download report as noted below.  She does report occasional difficulty using greater than 4 hours due to some difficulty tolerating and other times may wake up in the middle the night to go to the bathroom and will forget to place mask back on. Daughter does note improvement of cognition after a night of using BiPAP.  Patient feels improvement of her sleep with use.  Epworth Sleepiness Scale 4/24.  No further questions or concerns regarding BiPAP.  Cognition relatively stable, does have good days/bad days  Denies any new stroke/TIA symptoms.  Compliant on all medications.  Blood pressure well-controlled.  Routinely follows with PCP Dr. Damita [name].          History provided for reference purposes only Update 02/06/2022 JM: Patient returns for 79-monthstroke follow-up accompanied by her daughter  She has been stable from a stroke standpoint without new stroke/TIA symptoms.  Remains on Brilinta and atorvastatin.  Blood pressure well controlled.  Loop recorder has not shown atrial fibrillation thus far.  Cognition 23/30 (prior 25/30) Feels like her memory has been gradually declining, reports she can be "confused" at time (such as drawing a clock or unable to remember what she is supposed to be doing).  Mornings can be worse.  Still underlying anxiety as well as  to be followed by behavioral health with medication changes back in June, per daughter believes anxiety/depression has been improving Remains on Aricept, denies side effects She continues to live alone maintains ADLs independently.  Family checks on her routinely.  Recently repeated sleep study which showed severe sleep apnea with a primary central component and recommended treatment with BiPAP after completion of titration study, prior use of CPAP.  She is currently awaiting for machine.  She is scheduled to be seen with Dr. ARexene Alberts12/26 for initial follow-up.  She does report using her CPAP machine still but unable to use throughout the whole duration of the night.  No further concerns at this time  Update 08/06/2021 JM: 81year old female with history of multiple recurrent strokes and MCI.  Returns today for 79-monthollow-up visit accompanied by her daughter, SuVinnie [name] Feels more shaky/unsteady while walking which has been a gradual decline since prior visit.  Besides doing things simple chores around her home, there is no routine activity or exercise.  She continues to use a roller walker, did have fall 2 weeks ago after waking up in recliner around 1am and while trying to walk to her bed, she fell on her bottom, did not hit head, no injury. Denies any other falls. Poor appetite but daughter ensures she eats appropriately. Sleeping well more recently. Occasional use of CPAP, at times can have difficulty putting mask on.  Denies new stroke/TIA symptoms.  Remains on Brilinta and atorvastatin without side effects.  Blood pressure today 136/74.  Loop recorder has not shown atrial fibrillation thus far.  Cognition stable with good days/bad days.  Remains on Aricept, denies side effects.  Depression and anxiety currently on Zyprexa and Zoloft, closely monitored and managed by behavioral health with reported improvement since prior visit per [place] but per patient, no significant improvement - does get sad  regarding loss of independence since her strokes.   Living alone and able to maintain ADLs independently.  Does have supportive family who assists with majority of IADLs.  Does do simple cleaning and cooking.  No further concerns at this time.  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  Started on Zyprexa and donepezil and titrated home dose Zoloft.  She refused MRI during admission for further clarification of possible vascular dementia.  She also [facility] Brilinta for 2 weeks per neurosurgery recommendations.  Also treated for UTI.  [place]8/15 23/30.  Recommended 24/7 care or placement in assisted living.  She was ultimately discharged home with family.  Since discharge, she has been doing well.  Daughter reports improvement of paranoia and psychosis on current regimen.  Family was initially providing 24/7 supervision but as she was doing well continue ADLs majority of IADLs independently, family started to leave her by herself a couples hrs a day since last week. She did have a fall yesterday with right leg giving out but thankfully without injury.  [facility] for strengthening and gait as well as lower back exercises and use of Rollator walker at all times.  Daughter still assists with medication and bill paying.  Cognition overall stable.  MMSE today 25/30.  Remains on Aricept.  Also remains on Brilinta and atorvastatin.  Blood pressure today 143/83.  No further concerns at this time   Update 11/05/2020 Dr. SLeonie [name] She returns for follow-up after last visit with JMillingtonpractitioner 6 weeks ago.  She is accompanied by her daughter.  Their main complaint today is her  memory loss which she has had since stroke.  Her short-term memory is poor.  She is also continues to have problems with anxiety and paranoia.  [facility]  She initially tried Lexapro and Zoloft without benefit.  She is currently taking Seroquel at night but mostly to help her sleep.  She still having some mild anxiety and paranoid ideation.  She continues to have mild weakness in the right leg as well as left hand digits getting better.  She is tolerating Brilinta well without any bruising.  Blood pressures well controlled today it is 131/86.  She is tolerating Lipitor well without muscle aches and pains.  Patient is having short-term memory difficulties ever since her stroke which is not getting better.  Her daughter started helping her do her bills and finances.  She still lives alone and is independent.  She has not had any work-up for reversible causes of memory loss.  Update 09/12/2020, (Frann Rider NP ) Ms. FBrodbeckreturns for acute visit regarding new onset symptoms accompanied by her daughter  Approx 1 month ago, she suffered a fall after her right leg gave out.  She was fully evaluated in ED who felt  likely due to chronic knee pain but per family request, CT head and MRI brain completed which was unremarkable for acute stroke.  Since that time, daughter has noticed worsening memory with increased times of confusion, forgetfulness and unsteadiness. Per daughter, symptoms onset around the time aspirin was discontinued.  Patient has chronic history of low back pain previously seen by [name] in 10/2019 but no recent follow-up as this was relatively stable but over the past month, pain has been more consistent and more frequent episodes of bilateral hip weakness sensation and right leg weakness typically occurring after prolonged ambulation.  She does have scheduled visit with [name] on 4/28 to undergo nerve block injections.   She does continue to live  independently but her daughter checks on her routinely and assists with IADLs.  She has been greatly struggling with anxiety over the past month.  She does have history of anxiety but recently greatly worsened.  PCP recently started Lexapro and recommended increasing to 15 mg daily but she is very hesitant to increase as she read online Lexapro can worsen anxiety.  She is also been struggling with insomnia as her mind will constantly wander with hydroxyzine prescribed by PCP but she denies much benefit.  She speaks of difficulty with her relationships as she has been limiting social interactions due to her low back pain and fear of her right leg giving out - she also feels like she is constantly being judged by her friends or worried about what they are thinking of her.  She is concerned if she misses a social event due to her low back pain and what sounds like social anxiety.  She is also fearful of undergoing nerve block injections as she is on Brilinta and feels like this should be discontinued prior to injection but was told by [name] nurse assistant that that was not necessary.  Of note, seen by PCP Dr. Damita [name] 4/5 regarding these concerns who felt multifactorial and likely not new acute strokes.  She was also treated for shingles with Valtrex.  She has remained on Brilinta and atorvastatin without associated side effects Blood pressure 142/86 -occasionally monitored at home and typically stable She does endorse nightly compliance with CPAP for OSA management Loop recorder has not shown atrial fibrillation thus far  No further concerns at this time   Update 07/04/2020 JM: [name] [name] returns for scheduled follow-up regarding prior strokes accompanied by her daughter. She has been stable since prior visit without any additional stroke/TIA symptoms. Reports residual left hand weakness and gait impairment. She will use RW at times especially upon awakening but otherwise doesn't need AD. She denies any  recent falls.  She has remained on Brilinta, aspirin and atorvastatin 80 mg daily without side effects.  75-monthaspirin and Brilinta duration will be completed at the end of this month.  Blood pressure today 121/74.  Monitors at home and typically stable.  Reports ongoing compliance with CPAP followed by Dr. ARexene [name] Due to concern of multiple strokes etiology cryptogenic, loop recorder placed by Dr. ARayann Hemanon 06/20/2020 for further long-term monitoring of atrial fibrillation.  No further concerns at this time.  Update 05/14/2020 JM: Ms. FAmesreturns per request to follow-up on recent hospitalizations.  She is accompanied by her daughter.  Personally reviewed recent hospitalizations with summary provided below.  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  P2 Y 12 4 showing inadequate Plavix function therefore switched to aspirin 81 mg daily and Brilinta 90 mg twice daily for 3 months and then continue on Brilinta alone.  Initially recommended discharge to CIR but due to functional improvement she was discharged home on 04/23/2020 with home health therapies.  She then presented to [facility] ED on 05/08/2020 for 1 wk hx of intermittent LUE and LLE weakness, numbness/tingling and right-sided headache but due to wait time she left prior to full evaluation and went to [facility] ED with MRI reporting acute to early subacute infarct in the left centrum semiovale which was not consistent with presenting symptoms.  Per further review with neurologist, it was felt as though reported new infarct seen on prior MRIs therefore no evidence of new stroke.  Evaluated by neurology with note personally reviewed and felt possible cause of symptoms in setting of hypoperfusion with known intracranial arthrosclerosis and atenolol dosage decreased.  She then returned to [facility] ED  yesterday, 05/13/2020, for left lower extremity heaviness, palpitations and headache.  Found to be hypertensive with BP 196/105.  No neurological deficits noted.  Symptoms resolved without intervention.  Mention of increased anxiety possibly contributing to symptoms.  CT head negative.  She was discharged back home in stable condition.  She reports continued intermittent b/l hand weakness where she will occasionally drop items and continued gait impairment currently working with [facility] PT. currently using rolling walker for ambulation.  Denies new or worsening stroke/TIA symptoms.  Remains on aspirin 81 mg daily and Brilinta without bleeding or bruising.  Remains on atorvastatin 80 mg daily without myalgias.  Blood pressure today 128/75.  She does routinely monitor at home but she questions accuracy as her results are consistently 150s.  She does report nightly use of CPAP for OSA management.  Completed approximately 14 days of cardiac monitor which was negative for atrial fibrillation.  She questions need of ILR.  She does endorse increased anxiety regarding multiple strokes and WHEN (not if) another one will occur.  No further concerns at this time.  Update 03/01/2020 JM: [name] [name] returns for sooner scheduled visit per PCP request due to recent stroke accompanied by her daughter.  She presented to [facility] regional emergency room on 02/16/2020 with right-sided weakness contributing to fall.  MRI showed left frontal lobe infarcts likely from stenosis and left M2 all within the same vascular territory and was not felt to be embolic.  MRA head showed progression of stenosis within the distal A2/proximal artery which is now severe with additional intracranial atherosclerotic stenosis without interval change including severe focal stenosis within the cavernous left ICA, moderate focal stenosis within the cavernous right ICA, moderate stenosis with A2 right ACA and moderate stenosis within the proximal P2 left PCA.  2D  echo showed EF of 60 to 65%.  On aspirin PTA and recommended DAPT " but long-term she may need Effient instead of Plavix or Brilinta".  Recommended to check Plavix assays outpatient to ensure adequate response.  HTN stable.  LDL 54 recommended continuation of atorvastatin 80 mg daily.  She did have right knee pain secondary to fall and recommended follow-up with orthopedics outpatient.  Evaluated by therapy initially recommended [facility] PT/OT but improved prior to discharge with [facility] therapy no longer indicated.  She was discharged home in stable condition and advised to follow-up with PCP, neurology and orthopedics.    She has been stable since returning home without new or reoccurring stroke/TIA symptoms.  Denies residual right-sided  symptoms but does continue to have chronic right knee pain and ankle pain post fall which has been limiting her ambulation.  She is currently working with [facility] PT and ambulating with rolling walker due to right leg pain.  Continues to have mild dysarthria and decreased LUE dexterity which has been stable after recent stroke and improvement since prior visit.  She has continued on aspirin and Plavix without bleeding or bruising.  Remains on atorvastatin 80 mg daily without myalgias.  Blood pressure today satisfactory at 112/69.  She reports intermittent use of CPAP for sleep apnea management with difficulty tolerating due to increased pressure.  Reports undergoing sleep study over 10 years ago and has not had any repeat study or follow-up to ensure adequate management of apnea.  Multiple questions regarding recent stroke and etiology as well as recommended Plavix lab work Nurse, children's) as recommended by [employer] neurologist.  Further reviewed recent admission and imaging with Dr. Leonie [name] due to concern of stroke locations and possible embolic etiology. Per Dr. Leonie [name], he does not believe recent stroke was due to large vessel disease but more so embolic secondary to unknown source as her  recent imaging showed infarcts within different vascular locations including ACA and MCA.  Also reviewed imaging from prior stroke in 06/2019 and due to size, potentially embolic. He recommends further cardiac evaluation with placement of loop recorder to assess for possible atrial fibrillation as stroke etiology.  No indication for TEE. He recommends aspirin and Plavix for only 3-week duration and then Plavix alone.  No indication for P2 Y 12 testing as she has no history or evidence of Plavix failure or concern.  Update 01/02/2020 JM: [name] [name] is being seen for stroke follow-up previously followed through [facility] research participating in [place] stroke trial. Evaluated during stroke trial on 10/10/2019 recently completing [place] therapies with residual deficits of mild decrease left hand dexterity, imbalance and dysarthria therefore additional orders placed for outpatient therapies.  She does report ongoing improvement and continues to work with neuro rehab PT/OT/ST for residual dysarthria and dysphonia, gait impairment/imbalance and LUE incoordination. Evaluated in [place] by Dr. 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  Reports continued participation SLP with [facility] Reports imbalance greatest difficulty still with improvement but also underlying chronic lower back, bilateral hip and knee pain.  She wishes to proceed with additional injections and nerve blocks therefore withdrew from research study approximately 1 month ago.  Continues to follow with [facility] She remains on aspirin '81mg'$  daily without bleeding or bruising. Continues on atorvastatin 80 mg daily without myalgias.  Recent lipid panel 10/13/2019 showed LDL 64.  Blood pressure today 116/70.  No further concerns.  Stroke admission 07/11/2019 Personally reviewed recent hospitalization pertinent notes, labs and imaging [name] [name] is a 81 y.o. female with history of HTN, HLD, obesity who  presented on 07/11/2019 with dysarthria, L facial droop, mild L hemiparesis as well as hypertensive urgency.  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  Recommended DAPT for 3 weeks and aspirin alone.  BP upon arrival 202/110 stabilized during admission and recommended long-term BP goal normotensive range.  LDL 130 and initiate atorvastatin 80 mg daily. Other stroke risk factors include advanced age, EtOH use, OSA on CPAP and family reported hx imaging with evidence of small AAA.  Evaluated by therapy and recommend discharge to CIR for ongoing therapy needs but unfortunately did not by insurance therefore discharged home  with home health therapy.  Also advised to follow-up with [facility] research is interested in [employer] stroke trial.   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  MRI  R corona radiata and caudate head small vessel disease. infarcts CTA head & neck negative LVO, negative arthrosclerosis or stenosis in neck, positive for intracranial arthrosclerosis with significant stenosis within moderate to severe left ICA anterior genu, moderate bilateral ICA distally to and moderate left PCA P1 2D Echo  EF 50 to 55% LDL 130 HgbA1c 5.5 Lovenox 40 mg sq daily for VTE prophylaxis No antithrombotic prior to admission, now on clopidogrel 75 mg daily and aspirin 324 mg. Change aspirin to 81. Continue DAPT x 3 weeks then aspirin alone.  Therapy recommendations: CLR -insurance denied therefore recommended HH therapy Disposition: Home Recommend aspirin and Plavix for 3 weeks followed by aspirin alone. She is  participating in the [employer] stroke prevention trial( standard of care antiplatelets and Factor xi inhibitor versus standard of care antiplates and placebo)         ROS:   14 system review of systems performed and negative with exception of see HPI   PMH:  Past Medical History:   Diagnosis Date   Arthritis    knees   Depression    Fatty liver    GERD (gastroesophageal reflux disease)    Hyperlipidemia    Hypertension    IBS (irritable bowel syndrome)    Obesity    Pneumonia    Sleep apnea    uses CPAP   Stroke (cerebrum) (HCC)     PSH:  Past Surgical History:  Procedure Laterality Date   BLADDER SURGERY     BREAST SURGERY     breast biopsy   BROW LIFT Bilateral 04/14/2017   Procedure: BLEPHAROPLASTY UPPER EYELID WITH EXCESS SKIN;  Surgeon: Karle Starch, MD;  Location: [place];  Service: Ophthalmology;  Laterality: Bilateral;   CATARACT EXTRACTION W/PHACO Left 02/05/2016   Procedure: CATARACT EXTRACTION PHACO AND INTRAOCULAR LENS PLACEMENT ([place]);  Surgeon: Birder Robson, MD;  Location: [facility];  Service: Ophthalmology;  Laterality: Left;  [place] 01:10 AP% 22.3 CDE 15.67 Fluid pack lot # [id number]   CATARACT EXTRACTION W/PHACO Right 02/26/2016   Procedure: CATARACT EXTRACTION PHACO AND INTRAOCULAR LENS PLACEMENT ([facility]);  Surgeon: Birder Robson, MD;  Location: [facility];  Service: Ophthalmology;  Laterality: Right;  [place] 57.4 AP% 24.0 CDE 13.75 Fluid Pack lot # [id number]   CHOLECYSTECTOMY     COLONOSCOPY WITH PROPOFOL N/A 12/18/2014   Procedure: COLONOSCOPY WITH PROPOFOL;  Surgeon: Manya Silvas, MD;  Location: [facility];  Service: Endoscopy;  Laterality: N/A;   DEEP NECK LYMPH NODE BIOPSY / EXCISION     implantable loop recorder implantation  06/20/2020   Medtronic Reveal Linq model LNQ 22 ([place] [id number]) implantable loop recorder for cryptogenic stroke   JOINT REPLACEMENT     KNEE ARTHROPLASTY Left 06/20/2015   Procedure: COMPUTER ASSISTED TOTAL KNEE ARTHROPLASTY;  Surgeon: Dereck Leep, MD;  Location: [facility];  Service: Orthopedics;  Laterality: Left;   PTOSIS REPAIR Bilateral 04/14/2017   Procedure: PTOSIS REPAIR RESECT EX;  Surgeon: Karle Starch, MD;  Location: [place];  Service: Ophthalmology;  Laterality:  Bilateral;  sleep apnea   TONSILLECTOMY     TOTAL HIP ARTHROPLASTY Right 07/21/2018   Procedure: TOTAL HIP ARTHROPLASTY ANTERIOR APPROACH;  Surgeon: Gaynelle Arabian, MD;  Location: [facility];  Service: Orthopedics;  Laterality: Right;    Social History:  Social History   Socioeconomic History   Marital status: Widowed    Spouse name: Not on file   Number of children: 3   Years of education: 63   Highest education [name]: Not on file  Occupational History   Occupation: retired  Tobacco Use   Smoking status: Never   Smokeless tobacco: Never  Vaping Use   Vaping Use: Never used  Substance and Sexual Activity   Alcohol use: Not Currently    Comment: ocassional glass of wine 2-3 times a year   Drug use: No   Sexual activity: Not Currently  Other Topics Concern   Not on file  Social History Narrative   Lives alone in home   Right Handed   Drinks 1-2 cups caffeine daily   Social Determinants of Health   Financial Resource Strain: Low Risk  (05/14/2022)   Overall Financial Resource Strain (CARDIA)    Difficulty of Paying Living Expenses: Not very hard  Food Insecurity: No Food Insecurity (05/14/2022)   Hunger Vital Sign    Worried About Running Out of Food in the Last Year: Never true    [place] in the Last Year: Never true  Transportation Needs: No Transportation Needs (05/14/2022)   PRAPARE - Hydrologist (Medical): No    Lack of Transportation (Non-Medical): No  Physical Activity: Insufficiently Active (05/14/2022)   Exercise Vital Sign    Days of Exercise per Week: 3 days    Minutes of Exercise per Session: 10 min  Stress: No Stress Concern Present (05/14/2022)   [place]    Feeling of Stress : Not at all  Social Connections: Socially Isolated (05/14/2022)   Social Connection and Isolation Panel [NHANES]    Frequency of Communication with Friends and Family: More than  three times a week    Frequency of Social Gatherings with Friends and Family: More than three times a week    Attends Religious Services: Never    Marine scientist or Organizations: No    Attends Archivist Meetings: Never    Marital Status: Widowed  Intimate Partner Violence: Not At Risk (05/14/2022)   Humiliation, Afraid, Rape, and Kick questionnaire    Fear of Current or Ex-Partner: No    Emotionally Abused: No    Physically Abused: No    Sexually Abused: No    Family History:  Family History  Problem Relation Age of Onset   Heart attack Mother    Dementia Father    Heart attack Father 31   Aortic aneurysm Brother    Stroke Paternal Grandfather    Colon cancer Neg Hx    Breast cancer Neg Hx     Medications:   Current Outpatient Medications on File Prior to Visit  Medication Sig Dispense Refill   atenolol (TENORMIN) 25 MG tablet TAKE 1 TABLET BY MOUTH 2 TIMES DAILY. 180 tablet 1   atorvastatin (LIPITOR) 40 MG tablet TAKE ONE TABLET (40 MG) BY MOUTH EVERY DAY 90 tablet 3   Cholecalciferol (VITAMIN D3) 25 MCG (1000 UT) CAPS Take 1 capsule (1,000 Units total) by mouth daily.     colestipol (COLESTID) 1 g tablet TAKE TWO TABLETS TWICE A DAY 120 tablet 0   donepezil (ARICEPT) 5 MG tablet TAKE 1 TABLET BY MOUTH DAILY. 90 tablet 1   OLANZapine (ZYPREXA) 10 MG tablet Take  1 tablet (10 mg total) by mouth at bedtime. 30 tablet 5   sertraline (ZOLOFT) 100 MG tablet Take 2 tablets (200 mg total) by mouth daily. 60 tablet 5   thiamine 100 MG tablet Take 1 tablet (100 mg total) by mouth daily. 90 tablet 1   ticagrelor (BRILINTA) 90 MG TABS tablet Take 1 tablet (90 mg total) by mouth 2 (two) times daily. Via [place] [employer] [place] pt assistance 180 tablet 1   No current facility-administered medications on file prior to visit.    Allergies:   Allergies  Allergen Reactions   Hydrocodone Nausea Only    Noted after surgery, may be able to tolerate with food       OBJECTIVE:  Physical Exam  Vitals:   05/21/22 1034  BP: 121/73  Pulse: 67  Weight: 127 lb (57.6 kg)  Height: 5' (1.524 m)     Body mass index is 24.8 kg/m. No results found.  General: well developed, well nourished, very pleasant elderly Caucasian female, seated, in no evident distress Neck: supple with no carotid or supraclavicular bruits Cardiovascular: regular rate and rhythm, no murmurs Vascular:  Normal pulses all extremities  Neurologic Exam Mental Status: Awake and fully alert.  Fluent speech and language.  Oriented to place and time. Recent and remote memory impaired. Attention span, concentration and fund of knowledge mostly appropriate with daughter supplementing history. Mood and affect appropriate.     05/14/2022    3:33 PM 02/06/2022    1:12 PM 08/06/2021   12:41 PM  MMSE - Mini Mental State Exam  Not completed: Unable to complete    Orientation to time  3 5  Orientation to Place  4 4  Registration  3 3  Attention/ Calculation  2 3  Recall  3 2  Language- name 2 objects  2 2  Language- repeat  0 1  Language- follow 3 step command  3 3  Language- read & follow direction  1 1  Write a sentence  1 1  Copy design  1 0  Total score  23 25   Cranial Nerves: Pupils equal, briskly reactive to light. Extraocular movements full without nystagmus. Visual fields full to confrontation. Hearing intact. Facial sensation intact.  Tongue and palate moves normally and symmetrically.  Mild left lower facial weakness Motor: Normal bulk and tone. Normal strength in all tested extremity muscles except mild weakness in left hand and mild HF weakness Sensory.: intact to touch , pinprick , position and vibratory sensation.  Coordination: Rapid alternating movements normal in all extremities except decreased left hand. Finger-to-nose and heel-to-shin performed accurately bilaterally.  Gait and Station: Stands from seated position without difficulty.  Stance is slightly  hunched.  Gait demonstrates relatively normal stride length with slight unsteadiness and imbalance with use of Rollator walker.  Tandem walking heel toe not attempted. Reflexes: 1+ and symmetric. Toes downgoing.       ASSESSMENT/PLAN: AANIKA DEFOOR is a 81 y.o. 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  P2Y12 4 on Plavix.  2 additional ED evaluations 05/09/2020 and 05/13/2020 for worsening left-sided weakness and numbness without evidence of acute infarct contributing to symptoms and felt likely in setting of hypoperfusion with intracranial arthrosclerosis. Vascular risk factors include HTN, HLD, intracranial arthrosclerosis  with stenosis, OSA on CPAP, EtOH use and obesity.  Returns today for initial CPAP compliance visit.    1.  OSA on CPAP -Compliance report shows suboptimal usage with residual AHI 8.0.  Discussed importance of increasing nightly usage with ensuring greater than 4 hours per night for optimal benefit and for insurance purposes.  Discussed ways to help improve tolerance.  She is motivated to continue to use.  Will keep current pressure settings at this time, will re-evaluate at f/u visit in 3 months. If compliance improved but AHI remains elevated, may consider pressure setting adjustments at that time.  Continue to follow with DME company [employer] for any needed supplies or CPAP related concerns.  2.  Mild cognitive impairment -Prior MMSE 23/30, will repeat at f/u visit -Continue Aricept 5 mg daily managed by PCP -Continue to follow with [facility] for depression and anxiety management -Dementia panel 10/2020 unremarkable -EEG 10/2020 normal  3.  History of prior strokes -Continue Brilinta and atorvastatin for secondary  stroke prevention measures -Loop recorder has not shown atrial fibrillation thus far -Maintain strict control of hypertension with blood pressure goal below 130/90, and lipids with LDL cholesterol goal below 70 mg percent  -Lipid panel 07/2021: LDL 66    Follow-up in March as scheduled   I spent 31 minutes of face-to-face and non-face-to-face time with patient and daughter.  This included previsit chart review, lab review, study review, order entry, electronic health record documentation, patient and daughter discussion/education regarding above diagnoses and treatment plan and answered all other questions to patient and daughter satisfaction  CC:  Tonia Ghent, MD     Frann Rider, AGNP-BC  [facility] [address] [place], [place] [address]  Phone [phone] Fax [phone] Note: This document was prepared with digital dictation and possible smart phrase technology. Any transcriptional errors that result from this process are unintentional.

## 2022-05-21 ENCOUNTER — Encounter: Payer: Self-pay | Admitting: Adult Health

## 2022-05-21 ENCOUNTER — Ambulatory Visit: Payer: Medicare HMO | Admitting: Adult Health

## 2022-05-21 VITALS — BP 121/73 | HR 67 | Ht 60.0 in | Wt 127.0 lb

## 2022-05-21 DIAGNOSIS — Z8673 Personal history of transient ischemic attack (TIA), and cerebral infarction without residual deficits: Secondary | ICD-10-CM | POA: Diagnosis not present

## 2022-05-21 DIAGNOSIS — G3184 Mild cognitive impairment, so stated: Secondary | ICD-10-CM | POA: Diagnosis not present

## 2022-05-21 DIAGNOSIS — G4733 Obstructive sleep apnea (adult) (pediatric): Secondary | ICD-10-CM

## 2022-05-21 NOTE — Patient Instructions (Signed)
Recommend trying to use your BiPAP machine every night and ensuring you are using greater than 4 hours per night for optimal benefit and per insurance requirements  Continue to follow with [facility] for any needed supplies or CPAP related concerns   Follow up in March as scheduled

## 2022-06-02 ENCOUNTER — Ambulatory Visit (INDEPENDENT_AMBULATORY_CARE_PROVIDER_SITE_OTHER): Payer: Medicare HMO

## 2022-06-02 DIAGNOSIS — I639 Cerebral infarction, unspecified: Secondary | ICD-10-CM

## 2022-06-03 LAB — CUP PACEART REMOTE DEVICE CHECK
Date Time Interrogation Session: 20240107230955
Implantable Pulse Generator Implant Date: 20220126

## 2022-06-05 NOTE — Progress Notes (Signed)
Carelink Summary Report / Loop Recorder 

## 2022-06-11 ENCOUNTER — Ambulatory Visit: Payer: Medicare HMO | Admitting: Neurology

## 2022-06-26 ENCOUNTER — Ambulatory Visit: Payer: Medicare HMO | Admitting: Neurology

## 2022-07-04 NOTE — Progress Notes (Signed)
Carelink Summary Report / Loop Recorder 

## 2022-07-06 LAB — CUP PACEART REMOTE DEVICE CHECK
Date Time Interrogation Session: 20240209230658
Implantable Pulse Generator Implant Date: 20220126

## 2022-07-07 ENCOUNTER — Ambulatory Visit: Payer: Medicare HMO

## 2022-07-07 DIAGNOSIS — I639 Cerebral infarction, unspecified: Secondary | ICD-10-CM | POA: Diagnosis not present

## 2022-07-29 ENCOUNTER — Other Ambulatory Visit: Payer: Self-pay

## 2022-07-29 DIAGNOSIS — K529 Noninfective gastroenteritis and colitis, unspecified: Secondary | ICD-10-CM

## 2022-07-29 MED ORDER — COLESTIPOL HCL 1 G PO TABS: ORAL_TABLET | ORAL | 3 refills | Status: DC

## 2022-07-29 NOTE — Telephone Encounter (Signed)
[name] [name] to refill Colestid 1 gm two tabs in the am and 1 tab po in the evening # 90, 3 refills with instructions to hold Colestid if no BM in 24 hours and to reduce to once daily if stools become too hard.  Not to take Colestid within 4 hours of any other medications. THX

## 2022-07-29 NOTE — Telephone Encounter (Signed)
Pt daughter [name] made aware of Carl Best NP recommendations: Prescription sent to pharmacy. [name] made aware. [name] verbalized understanding with all questions answered.

## 2022-08-07 ENCOUNTER — Ambulatory Visit: Payer: Medicare HMO | Admitting: Adult Health

## 2022-08-07 ENCOUNTER — Other Ambulatory Visit: Payer: Self-pay | Admitting: Family Medicine

## 2022-08-11 ENCOUNTER — Ambulatory Visit (INDEPENDENT_AMBULATORY_CARE_PROVIDER_SITE_OTHER): Payer: Medicare HMO

## 2022-08-11 DIAGNOSIS — I639 Cerebral infarction, unspecified: Secondary | ICD-10-CM | POA: Diagnosis not present

## 2022-08-12 LAB — CUP PACEART REMOTE DEVICE CHECK
Date Time Interrogation Session: 20240317231258
Implantable Pulse Generator Implant Date: 20220126

## 2022-08-14 NOTE — Progress Notes (Signed)
[facility] [address] [place]. [place] [address] [phone]       STROKE FOLLOW UP NOTE  [name] Date of Birth:  Oct 23, 1940 Medical Record Number:  [id number]   Primary neurologist: Dr. Leonie Man Sleep neurologist: Dr. Rexene Alberts Reason for visit: OSA on CPAP, hx of stroke and cognitive impairment    SUBJECTIVE:   CHIEF COMPLAINT:  Chief Complaint  Patient presents with   Follow-up    RM 3 with daughter [name] [name] Pt is well and stable from a stroke standpoint. She also states she feels her cognition and memeory has worsen since OV in sept      HPI:   Update 08/18/2022 JM: Patient returns for routine follow-up accompanied by her daughter.  Stable from stroke standpoint without new stroke/TIA symptoms.  Patient feels like her cognition and memory has been relatively stable, per daughter some decline noted.  MMSE today 25/30 (prior 23/30).  Continues on Aricept 5 mg nightly.  Compliant on Brilinta and atorvastatin.  Blood pressure well-controlled.  Loop recorder has not shown atrial fibrillation thus far.  Reports use of BiPAP most nights, daughter needs to remind her to use. Reports tolerating well but does have leaks which can be bothersome.  Compliance report over the past 30 days shows 24 out of 30 usage days with 15 days greater than 4 hours for 50% compliance.  Residual AHI 8.6 on pressure settings of 9/5 with respiratory rate 12.     History provided for reference purposes only Update 05/21/2022 JM: Patient returns for initial BiPAP compliance visit.  She is accompanied by her daughter.  Download report as noted below.  She does report occasional difficulty using greater than 4 hours due to some difficulty tolerating and other times may wake up in the middle the night to go to the bathroom and will forget to place mask back on. Daughter does note improvement of cognition after a night of using BiPAP.  Patient feels improvement of her sleep with use.   Epworth Sleepiness Scale 4/24.  No further questions or concerns regarding BiPAP.  Cognition relatively stable, does have good days/bad days  Denies any new stroke/TIA symptoms.  Compliant on all medications.  Blood pressure well-controlled.  Routinely follows with PCP Dr. Damita Dunnings.       Update 02/06/2022 JM: Patient returns for 12-month stroke follow-up accompanied by her daughter  She has been stable from a stroke standpoint without new stroke/TIA symptoms.  Remains on Brilinta and atorvastatin.  Blood pressure well controlled.  Loop recorder has not shown atrial fibrillation thus far.  Cognition 23/30 (prior 25/30) Feels like her memory has been gradually declining, reports she can be "confused" at time (such as drawing a clock or unable to remember what she is supposed to be doing).  Mornings can be worse.  Still underlying anxiety as well as to be followed by behavioral health with medication changes back in June, per daughter believes anxiety/depression has been improving Remains on Aricept, denies side effects She continues to live alone maintains ADLs independently.  Family checks on her routinely.  Recently repeated sleep study which showed severe sleep apnea with a primary central component and recommended treatment with BiPAP after completion of titration study, prior use of CPAP.  She is currently awaiting for machine.  She is scheduled to be seen with Dr. Rexene Alberts 12/26 for initial follow-up.  She does report using her CPAP machine still but unable to use throughout the whole duration of the night.  No further concerns at this time  Update 08/06/2021 JM: 82 year old female with history of multiple recurrent strokes and MCI.  Returns today for 38-month follow-up visit accompanied by her daughter, [name] [name].  Feels more shaky/unsteady while walking which has been a gradual decline since prior visit.  Besides doing things simple chores around her home, there is no routine activity or  exercise.  She continues to use a roller walker, did have fall 2 weeks ago after waking up in recliner around 1am and while trying to walk to her bed, she fell on her bottom, did not hit head, no injury. Denies any other falls. Poor appetite but daughter ensures she eats appropriately. Sleeping well more recently. Occasional use of CPAP, at times can have difficulty putting mask on.  Denies new stroke/TIA symptoms.  Remains on Brilinta and atorvastatin without side effects.  Blood pressure today 136/74.  Loop recorder has not shown atrial fibrillation thus far.  Cognition stable with good days/bad days.  Remains on Aricept, denies side effects.  Depression and anxiety currently on Zyprexa and Zoloft, closely monitored and managed by behavioral health with reported improvement since prior visit per [place] but per patient, no significant improvement - does get sad regarding loss of independence since her strokes.   Living alone and able to maintain ADLs independently.  Does have supportive family who assists with majority of IADLs.  Does do simple cleaning and cooking.  No further concerns at this time.  Update 02/04/2021 JM: [name] [name] returns for 64-month follow-up accompanied by her daughter, [name] [name].  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  Started on Zyprexa and donepezil and titrated home dose Zoloft.  She refused MRI during admission for further clarification of possible vascular dementia.  She also had a fall while hospitalized resulting in [facility] - held Brilinta for 2 weeks per neurosurgery recommendations.  Also treated for UTI.  [place] 8/15 23/30.  Recommended 24/7 care or placement in assisted living.  She was ultimately discharged home with family.  Since discharge, she has been doing well.  Daughter reports improvement of paranoia and  psychosis on current regimen.  Family was initially providing 24/7 supervision but as she was doing well continue ADLs majority of IADLs independently, family started to leave her by herself a couples hrs a day since last week. She did have a fall yesterday with right leg giving out but thankfully without injury.  She is currently working with [facility] PT for strengthening and gait as well as lower back exercises and use of Rollator walker at all times.  Daughter still assists with medication and bill paying.  Cognition overall stable.  MMSE today 25/30.  Remains on Aricept.  Also remains on Brilinta and atorvastatin.  Blood pressure today 143/83.  No further concerns at this time   Update 11/05/2020 Dr. Leonie Man: She returns for follow-up after last visit with [place] practitioner 6 weeks ago.  She is accompanied by her daughter.  Their main complaint today is her memory loss which she has had since stroke.  Her short-term memory is poor.  She is also continues to have problems with anxiety and paranoia.  She is currently being managed by [facility] psychiatry.  She initially tried Lexapro and Zoloft without benefit.  She is currently taking Seroquel at night but mostly to help her sleep.  She still having some mild  anxiety and paranoid ideation.  She continues to have mild weakness in the right leg as well as left hand digits getting [name].  She is tolerating Brilinta well without any bruising.  Blood pressures well controlled today it is 131/86.  She is tolerating Lipitor well without muscle aches and pains.  Patient is having short-term memory difficulties ever since her stroke which is not getting [name].  Her daughter started helping her do her bills and finances.  She still lives alone and is independent.  She has not had any work-up for reversible causes of memory loss.  Update 09/12/2020, Frann Rider, NP ) [name] [name] returns for acute visit regarding new onset symptoms accompanied by her  daughter  Approx 1 month ago, she suffered a fall after her right leg gave out.  She was fully evaluated in ED who felt likely due to chronic knee pain but per family request, CT head and MRI brain completed which was unremarkable for acute stroke.  Since that time, daughter has noticed worsening memory with increased times of confusion, forgetfulness and unsteadiness. Per daughter, symptoms onset around the time aspirin was discontinued.  Patient has chronic history of low back pain previously seen by [name] in 10/2019 but no recent follow-up as this was relatively stable but over the past month, pain has been more consistent and more frequent episodes of bilateral hip weakness sensation and right leg weakness typically occurring after prolonged ambulation.  She does have scheduled visit with [name] on 4/28 to undergo nerve block injections.   She does continue to live independently but her daughter checks on her routinely and assists with IADLs.  She has been greatly struggling with anxiety over the past month.  She does have history of anxiety but recently greatly worsened.  PCP recently started Lexapro and recommended increasing to 15 mg daily but she is very hesitant to increase as she read online Lexapro can worsen anxiety.  She is also been struggling with insomnia as her mind will constantly wander with hydroxyzine prescribed by PCP but she denies much benefit.  She speaks of difficulty with her relationships as she has been limiting social interactions due to her low back pain and fear of her right leg giving out - she also feels like she is constantly being judged by her friends or worried about what they are thinking of her.  She is concerned if she misses a social event due to her low back pain and what sounds like social anxiety.  She is also fearful of undergoing nerve block injections as she is on Brilinta and feels like this should be discontinued prior to injection but was told by [name]  nurse assistant that that was not necessary.  Of note, seen by PCP Dr. Damita Dunnings 4/5 regarding these concerns who felt multifactorial and likely not new acute strokes.  She was also treated for shingles with Valtrex.  She has remained on Brilinta and atorvastatin without associated side effects Blood pressure 142/86 -occasionally monitored at home and typically stable She does endorse nightly compliance with CPAP for OSA management Loop recorder has not shown atrial fibrillation thus far  No further concerns at this time   Update 07/04/2020 JM: [name] [name] returns for scheduled follow-up regarding prior strokes accompanied by her daughter. She has been stable since prior visit without any additional stroke/TIA symptoms. Reports residual left hand weakness and gait impairment. She will use RW at times especially upon awakening but otherwise doesn't need AD.  She denies any recent falls.  She has remained on Brilinta, aspirin and atorvastatin 80 mg daily without side effects.  79-month aspirin and Brilinta duration will be completed at the end of this month.  Blood pressure today 121/74.  Monitors at home and typically stable.  Reports ongoing compliance with CPAP followed by Dr. Rexene Alberts. Due to concern of multiple strokes etiology cryptogenic, loop recorder placed by Dr. Rayann Heman on 06/20/2020 for further long-term monitoring of atrial fibrillation.  No further concerns at this time.  Update 05/14/2020 JM: [name] [name] returns per request to follow-up on recent hospitalizations.  She is accompanied by her daughter.  Personally reviewed recent hospitalizations with summary provided below.  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  P2 Y 12 4 showing inadequate Plavix function therefore switched to aspirin 81 mg daily and Brilinta 90 mg twice daily for 3  months and then continue on Brilinta alone.  Initially recommended discharge to CIR but due to functional improvement she was discharged home on 04/23/2020 with home health therapies.  She then presented to [facility] ED on 05/08/2020 for 1 wk hx of intermittent LUE and LLE weakness, numbness/tingling and right-sided headache but due to wait time she left prior to full evaluation and went to [facility] ED with MRI reporting acute to early subacute infarct in the left centrum semiovale which was not consistent with presenting symptoms.  Per further review with neurologist, it was felt as though reported new infarct seen on prior MRIs therefore no evidence of new stroke.  Evaluated by neurology with note personally reviewed and felt possible cause of symptoms in setting of hypoperfusion with known intracranial arthrosclerosis and atenolol dosage decreased.  She then returned to [facility] ED yesterday, 05/13/2020, for left lower extremity heaviness, palpitations and headache.  Found to be hypertensive with BP 196/105.  No neurological deficits noted.  Symptoms resolved without intervention.  Mention of increased anxiety possibly contributing to symptoms.  CT head negative.  She was discharged back home in stable condition.  She reports continued intermittent b/l hand weakness where she will occasionally drop items and continued gait impairment currently working with [facility] PT. currently using rolling walker for ambulation.  Denies new or worsening stroke/TIA symptoms.  Remains on aspirin 81 mg daily and Brilinta without bleeding or bruising.  Remains on atorvastatin 80 mg daily without myalgias.  Blood pressure today 128/75.  She does routinely monitor at home but she questions accuracy as her results are consistently 150s.  She does report nightly use of CPAP for OSA management.  Completed approximately 14 days of cardiac monitor which was negative for atrial fibrillation.  She questions need of ILR.  She does endorse increased anxiety  regarding multiple strokes and WHEN (not if) another one will occur.  No further concerns at this time.  Update 03/01/2020 JM: [name] [name] returns for sooner scheduled visit per PCP request due to recent stroke accompanied by her daughter.  She presented to [facility] regional emergency room on 02/16/2020 with right-sided weakness contributing to fall.  MRI showed left frontal lobe infarcts likely from stenosis and left M2 all within the same vascular territory and was not felt to be embolic.  MRA head showed progression of stenosis within the distal A2/proximal artery which is now severe with additional intracranial atherosclerotic stenosis without interval change including severe focal stenosis within the cavernous left ICA, moderate  focal stenosis within the cavernous right ICA, moderate stenosis with A2 right ACA and moderate stenosis within the proximal P2 left PCA.  2D echo showed EF of 60 to 65%.  On aspirin PTA and recommended DAPT " but long-term she may need Effient instead of Plavix or Brilinta".  Recommended to check Plavix assays outpatient to ensure adequate response.  HTN stable.  LDL 54 recommended continuation of atorvastatin 80 mg daily.  She did have right knee pain secondary to fall and recommended follow-up with orthopedics outpatient.  Evaluated by therapy initially recommended [facility] PT/OT but improved prior to discharge with [facility] therapy no longer indicated.  She was discharged home in stable condition and advised to follow-up with PCP, neurology and orthopedics.    She has been stable since returning home without new or reoccurring stroke/TIA symptoms.  Denies residual right-sided symptoms but does continue to have chronic right knee pain and ankle pain post fall which has been limiting her ambulation.  She is currently working with [facility] PT and ambulating with rolling walker due to right leg pain.  Continues to have mild dysarthria and decreased LUE dexterity which has been stable after recent stroke  and improvement since prior visit.  She has continued on aspirin and Plavix without bleeding or bruising.  Remains on atorvastatin 80 mg daily without myalgias.  Blood pressure today satisfactory at 112/69.  She reports intermittent use of CPAP for sleep apnea management with difficulty tolerating due to increased pressure.  Reports undergoing sleep study over 10 years ago and has not had any repeat study or follow-up to ensure adequate management of apnea.  Multiple questions regarding recent stroke and etiology as well as recommended Plavix lab work Nurse, children's) as recommended by [employer] neurologist.  Further reviewed recent admission and imaging with Dr. Leonie Man due to concern of stroke locations and possible embolic etiology. Per Dr. Leonie Man, he does not believe recent stroke was due to large vessel disease but more so embolic secondary to unknown source as her recent imaging showed infarcts within different vascular locations including ACA and MCA.  Also reviewed imaging from prior stroke in 06/2019 and due to size, potentially embolic. He recommends further cardiac evaluation with placement of loop recorder to assess for possible atrial fibrillation as stroke etiology.  No indication for TEE. He recommends aspirin and Plavix for only 3-week duration and then Plavix alone.  No indication for P2 Y 12 testing as she has no history or evidence of Plavix failure or concern.  Update 01/02/2020 JM: [name] [name] is being seen for stroke follow-up previously followed through [facility] research participating in [place] stroke trial. Evaluated during stroke trial on 10/10/2019 recently completing [place] therapies with residual deficits of mild decrease left hand dexterity, imbalance and dysarthria therefore additional orders placed for outpatient therapies.  She does report ongoing improvement and continues to work with neuro rehab PT/OT/ST for residual dysarthria and dysphonia, gait impairment/imbalance and LUE  incoordination. Evaluated in [place] by Dr. 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  Reports continued participation SLP with [facility] Reports imbalance greatest difficulty still with improvement but also underlying chronic lower back, bilateral hip and knee pain.  She wishes to proceed with additional injections and nerve blocks therefore withdrew from research study approximately 1 month ago.  Continues to follow with [facility] She remains on aspirin 81mg  daily without bleeding or bruising. Continues on atorvastatin 80 mg daily without myalgias.  Recent lipid panel 10/13/2019 showed LDL  64.  Blood pressure today 116/70.  No further concerns.  Stroke admission 07/11/2019 Personally reviewed recent hospitalization pertinent notes, labs and imaging [name] is a 82 y.o. female with history of HTN, HLD, obesity who presented on 07/11/2019 with dysarthria, L facial droop, mild L hemiparesis as well as hypertensive urgency.  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  Recommended DAPT for 3 weeks and aspirin alone.  BP upon arrival 202/110 stabilized during admission and recommended long-term BP goal normotensive range.  LDL 130 and initiate atorvastatin 80 mg daily. Other stroke risk factors include advanced age, EtOH use, OSA on CPAP and family reported hx imaging with evidence of small AAA.  Evaluated by therapy and recommend discharge to CIR for ongoing therapy needs but unfortunately did not by insurance therefore discharged home with home health therapy.  Also advised to follow-up with [facility] research is interested in [employer] stroke trial.   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  MRI  R corona radiata and caudate head small vessel disease. infarcts CTA head & neck negative LVO, negative arthrosclerosis or stenosis in neck,  positive for intracranial arthrosclerosis with significant stenosis within moderate to severe left ICA anterior genu, moderate bilateral ICA distally to and moderate left PCA P1 2D Echo  EF 50 to 55% LDL 130 HgbA1c 5.5 Lovenox 40 mg sq daily for VTE prophylaxis No antithrombotic prior to admission, now on clopidogrel 75 mg daily and aspirin 324 mg. Change aspirin to 81. Continue DAPT x 3 weeks then aspirin alone.  Therapy recommendations: CLR -insurance denied therefore recommended HH therapy Disposition: Home Recommend aspirin and Plavix for 3 weeks followed by aspirin alone. She is  participating in the [employer] stroke prevention trial( standard of care antiplatelets and Factor xi inhibitor versus standard of care antiplates and placebo)         ROS:   14 system review of systems performed and negative with exception of see HPI   PMH:  Past Medical History:  Diagnosis Date   Arthritis    knees   Depression    Fatty liver    GERD (gastroesophageal reflux disease)    Hyperlipidemia    Hypertension    IBS (irritable bowel syndrome)    Obesity    Pneumonia    Sleep apnea    uses CPAP   Stroke (cerebrum) (HCC)     PSH:  Past Surgical History:  Procedure Laterality Date   BLADDER SURGERY     BREAST SURGERY     breast biopsy   BROW LIFT Bilateral 04/14/2017   Procedure: BLEPHAROPLASTY UPPER EYELID WITH EXCESS SKIN;  Surgeon: Karle Starch, MD;  Location: [facility];  Service: Ophthalmology;  Laterality: Bilateral;   CATARACT EXTRACTION W/PHACO Left 02/05/2016   Procedure: CATARACT EXTRACTION PHACO AND INTRAOCULAR LENS PLACEMENT ([place]);  Surgeon: Birder Robson, MD;  Location: [facility];  Service: Ophthalmology;  Laterality: Left;  [place] 01:10 AP% 22.3 CDE 15.67 Fluid pack lot # [id number]   CATARACT EXTRACTION W/PHACO Right 02/26/2016   Procedure: CATARACT EXTRACTION PHACO AND INTRAOCULAR LENS PLACEMENT (IOC);  Surgeon: Birder Robson, MD;  Location: [facility];   Service: Ophthalmology;  Laterality: Right;  [place] 57.4 AP% 24.0 CDE 13.75 Fluid Pack lot # [id number]   CHOLECYSTECTOMY     COLONOSCOPY WITH PROPOFOL N/A 12/18/2014   Procedure: COLONOSCOPY WITH PROPOFOL;  Surgeon: Herbie Baltimore  Federico Flake, MD;  Location: [facility];  Service: Endoscopy;  Laterality: N/A;   DEEP NECK LYMPH NODE BIOPSY / EXCISION     implantable loop recorder implantation  06/20/2020   Medtronic Reveal Linq model LNQ 22 ([id number] [id number]) implantable loop recorder for cryptogenic stroke   JOINT REPLACEMENT     KNEE ARTHROPLASTY Left 06/20/2015   Procedure: COMPUTER ASSISTED TOTAL KNEE ARTHROPLASTY;  Surgeon: Dereck Leep, MD;  Location: [facility];  Service: Orthopedics;  Laterality: Left;   PTOSIS REPAIR Bilateral 04/14/2017   Procedure: PTOSIS REPAIR RESECT EX;  Surgeon: Karle Starch, MD;  Location: [facility];  Service: Ophthalmology;  Laterality: Bilateral;  sleep apnea   TONSILLECTOMY     TOTAL HIP ARTHROPLASTY Right 07/21/2018   Procedure: TOTAL HIP ARTHROPLASTY ANTERIOR APPROACH;  Surgeon: Gaynelle Arabian, MD;  Location: [facility];  Service: Orthopedics;  Laterality: Right;    Social History:  Social History   Socioeconomic History   Marital status: Widowed    Spouse name: Not on file   Number of children: 3   Years of education: 25   Highest education [name]: Not on file  Occupational History   Occupation: retired  Tobacco Use   Smoking status: Never   Smokeless tobacco: Never  Vaping Use   Vaping Use: Never used  Substance and Sexual Activity   Alcohol use: Not Currently    Comment: ocassional glass of wine 2-3 times a year   Drug use: No   Sexual activity: Not Currently  Other Topics Concern   Not on file  Social History Narrative   Lives alone in home   Right Handed   Drinks 1-2 cups caffeine daily   Social Determinants of Health   Financial Resource Strain: Low Risk  (05/14/2022)   Overall Financial Resource Strain (CARDIA)    Difficulty of  Paying Living Expenses: Not very hard  Food Insecurity: No Food Insecurity (05/14/2022)   Hunger Vital Sign    Worried About Running Out of Food in the Last Year: Never true    [place] in the Last Year: Never true  Transportation Needs: No Transportation Needs (05/14/2022)   PRAPARE - Hydrologist (Medical): No    Lack of Transportation (Non-Medical): No  Physical Activity: Insufficiently Active (05/14/2022)   Exercise Vital Sign    Days of Exercise per Week: 3 days    Minutes of Exercise per Session: 10 min  Stress: No Stress Concern Present (05/14/2022)   [place]    Feeling of Stress : Not at all  Social Connections: Socially Isolated (05/14/2022)   Social Connection and Isolation Panel [NHANES]    Frequency of Communication with Friends and Family: More than three times a week    Frequency of Social Gatherings with Friends and Family: More than three times a week    Attends Religious Services: Never    Marine scientist or Organizations: No    Attends Archivist Meetings: Never    Marital Status: Widowed  Intimate Partner Violence: Not At Risk (05/14/2022)   Humiliation, Afraid, Rape, and Kick questionnaire    Fear of Current or Ex-Partner: No    Emotionally Abused: No    Physically Abused: No    Sexually Abused: No    Family History:  Family History  Problem Relation Age of Onset   Heart attack Mother    Dementia Father  Heart attack Father 49   Aortic aneurysm Brother    Stroke Paternal Grandfather    Colon cancer Neg Hx    Breast cancer Neg Hx     Medications:   Current Outpatient Medications on File Prior to Visit  Medication Sig Dispense Refill   atenolol (TENORMIN) 25 MG tablet TAKE 1 TABLET BY MOUTH 2 TIMES DAILY. 180 tablet 1   atorvastatin (LIPITOR) 40 MG tablet TAKE ONE TABLET (40 MG) BY MOUTH EVERY DAY 90 tablet 3   Cholecalciferol  (VITAMIN D3) 25 MCG (1000 UT) CAPS Take 1 capsule (1,000 Units total) by mouth daily.     colestipol (COLESTID) 1 g tablet Take two tablets in the am and 1 tablet  in the evening:  Instructions to hold Colestid if no BM in 24 hours and to reduce to once daily if stools become too hard.  Not to take Colestid within 4 hours of any other medications. 90 tablet 3   donepezil (ARICEPT) 5 MG tablet TAKE 1 TABLET BY MOUTH DAILY. 90 tablet 1   OLANZapine (ZYPREXA) 10 MG tablet Take 1 tablet (10 mg total) by mouth at bedtime. 30 tablet 5   sertraline (ZOLOFT) 100 MG tablet Take 2 tablets (200 mg total) by mouth daily. 60 tablet 5   thiamine 100 MG tablet Take 1 tablet (100 mg total) by mouth daily. 90 tablet 1   ticagrelor (BRILINTA) 90 MG TABS tablet Take 1 tablet (90 mg total) by mouth 2 (two) times daily. Via [place] & [place] pt assistance 180 tablet 1   No current facility-administered medications on file prior to visit.    Allergies:   Allergies  Allergen Reactions   Hydrocodone Nausea Only    Noted after surgery, may be able to tolerate with food      OBJECTIVE:  Physical Exam  Vitals:   08/18/22 1034  BP: 125/77  Pulse: 67  Weight: 128 lb (58.1 kg)  Height: 5' (1.524 m)   Body mass index is 25 kg/m. No results found.  General: well developed, well nourished, very pleasant elderly Caucasian female, seated, in no evident distress Neck: supple with no carotid or supraclavicular bruits Cardiovascular: regular rate and rhythm, no murmurs Vascular:  Normal pulses all extremities  Neurologic Exam Mental Status: Awake and fully alert.  Fluent speech and language.  Oriented to place and time. Recent and remote memory impaired. Attention span, concentration and fund of knowledge mostly appropriate with daughter supplementing history. Mood and affect appropriate.     08/18/2022   10:35 AM 05/14/2022    3:33 PM 02/06/2022    1:12 PM  MMSE - Mini Mental State Exam  Not completed:  Unable to  complete   Orientation to time 3  3  Orientation to Place 5  4  Registration 3  3  Attention/ Calculation 3  2  Recall 3  3  Language- name 2 objects 2  2  Language- repeat 1  0  Language- follow 3 step command 3  3  Language- read & follow direction 1  1  Write a sentence 1  1  Copy design 0  1  Total score 25  23   Cranial Nerves: Pupils equal, briskly reactive to light. Extraocular movements full without nystagmus. Visual fields full to confrontation. Hearing intact. Facial sensation intact.  Tongue and palate moves normally and symmetrically.  Mild left lower facial weakness Motor: Normal bulk and tone. Normal strength in all tested extremity muscles except mild weakness  in left hand and mild HF weakness Sensory.: intact to touch , pinprick , position and vibratory sensation.  Coordination: Rapid alternating movements normal in all extremities except decreased left hand. Finger-to-nose and heel-to-shin performed accurately bilaterally.  Gait and Station: Stands from seated position without difficulty.  Stance is slightly hunched.  Gait demonstrates relatively normal stride length with slight unsteadiness and imbalance with use of Rollator walker.  Tandem walking heel toe not attempted. Reflexes: 1+ and symmetric. Toes downgoing.       ASSESSMENT/PLAN: [name] [name] is a 82 y.o. 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  P2Y12 4 on Plavix.  2 additional ED evaluations 05/09/2020 and 05/13/2020 for worsening left-sided weakness and numbness without evidence of acute infarct contributing to symptoms and felt likely in setting of hypoperfusion with intracranial arthrosclerosis. 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      1.  Sleep apnea on BiPAP -Compliance report shows suboptimal usage with residual AHI 8.2.  Discussed importance of increasing nightly usage with ensuring greater than 4 hours per night for optimal benefit and for insurance purposes.  Will also request mask refitting with DME company due to occasional leaks currently at 27.9 L/min. Will keep current pressure settings at this time, will re-evaluate at f/u visit with hopeful improvement of compliance. If compliance improved but AHI remains elevated, may consider pressure setting adjustments at that time.  Continue to follow with DME company [employer] for any needed supplies or CPAP related concerns.  2.  Mild cognitive impairment -Prior MMSE 25/30 (prior 23/30 01/2022) -Continue Aricept 5 mg daily managed by PCP -Continue to follow with [facility] for depression and anxiety management -Dementia panel 10/2020 unremarkable -EEG 10/2020 normal  3.  History of prior strokes -Continue Brilinta and atorvastatin for secondary stroke prevention measures -Loop recorder has not shown atrial fibrillation thus far -Maintain strict control of hypertension with blood pressure goal below 130/90, and lipids with LDL cholesterol goal below 70 mg percent     Follow-up in 6 months or call earlier if needed     I spent 36 minutes of face-to-face and non-face-to-face time with patient and daughter.  This included previsit chart review, lab review, study review, order entry, electronic health record documentation, patient and daughter discussion/education regarding above diagnoses and treatment plan and answered all other questions to patient and daughter satisfaction  CC:  Tonia Ghent, MD     Frann Rider, AGNP-BC  [facility] [address] [place], [place] [address]  Phone [phone] Fax [phone] Note: This  document was prepared with digital dictation and possible smart phrase technology. Any transcriptional errors that result from this process are unintentional.

## 2022-08-18 ENCOUNTER — Encounter: Payer: Self-pay | Admitting: Adult Health

## 2022-08-18 ENCOUNTER — Ambulatory Visit: Payer: Medicare HMO | Admitting: Adult Health

## 2022-08-18 VITALS — BP 125/77 | HR 67 | Ht 60.0 in | Wt 128.0 lb

## 2022-08-18 DIAGNOSIS — G4731 Primary central sleep apnea: Secondary | ICD-10-CM | POA: Diagnosis not present

## 2022-08-18 DIAGNOSIS — R413 Other amnesia: Secondary | ICD-10-CM

## 2022-08-18 DIAGNOSIS — Z8673 Personal history of transient ischemic attack (TIA), and cerebral infarction without residual deficits: Secondary | ICD-10-CM

## 2022-08-18 DIAGNOSIS — Z9989 Dependence on other enabling machines and devices: Secondary | ICD-10-CM

## 2022-08-18 NOTE — Patient Instructions (Addendum)
Increase mental activities with brain exercises such as cross word puzzles, word search and sudoku, this can help slow your memory decline  Please increase nightly BiPAP use and ensure you are using greater than 4 hours per night. Please bring the BiPAP SD card at follow up visit to re-evaluate   Continue Brilinta (ticagrelor) 90 mg bid  and atorvastatin  for secondary stroke prevention  Continue to follow up with PCP regarding blood pressure and cholesterol management  Maintain strict control of hypertension with blood pressure goal below 130/90 and cholesterol with LDL cholesterol (bad cholesterol) goal below 70 mg/dL.   Signs of a Stroke? Follow the BEFAST method:  Balance Watch for a sudden loss of balance, trouble with coordination or vertigo Eyes Is there a sudden loss of vision in one or both eyes? Or double vision?  Face: Ask the person to smile. Does one side of the face droop or is it numb?  Arms: Ask the person to raise both arms. Does one arm drift downward? Is there weakness or numbness of a leg? Speech: Ask the person to repeat a simple phrase. Does the speech sound slurred/strange? Is the person confused ? Time: If you observe any of these signs, call 911.      Followup in the future with me in 6 months or call earlier if needed       Thank you for coming to see [place] at [facility]. I hope we have been able to provide you high quality care today.  You may receive a patient satisfaction survey over the next few weeks. We would appreciate your feedback and comments so that we may continue to improve ourselves and the health of our patients.

## 2022-08-19 ENCOUNTER — Telehealth: Payer: Self-pay

## 2022-08-19 NOTE — Progress Notes (Signed)
Carelink Summary Report / Loop Recorder 

## 2022-09-15 ENCOUNTER — Ambulatory Visit: Payer: Medicare HMO

## 2022-09-15 LAB — CUP PACEART REMOTE DEVICE CHECK
Date Time Interrogation Session: 20240419230456
Implantable Pulse Generator Implant Date: 20220126

## 2022-09-17 ENCOUNTER — Other Ambulatory Visit: Payer: Self-pay | Admitting: Behavioral Health

## 2022-09-17 DIAGNOSIS — F331 Major depressive disorder, recurrent, moderate: Secondary | ICD-10-CM

## 2022-09-17 DIAGNOSIS — R630 Anorexia: Secondary | ICD-10-CM

## 2022-09-17 DIAGNOSIS — R4189 Other symptoms and signs involving cognitive functions and awareness: Secondary | ICD-10-CM

## 2022-09-17 DIAGNOSIS — F411 Generalized anxiety disorder: Secondary | ICD-10-CM

## 2022-09-18 ENCOUNTER — Ambulatory Visit (INDEPENDENT_AMBULATORY_CARE_PROVIDER_SITE_OTHER): Payer: Medicare HMO

## 2022-09-18 DIAGNOSIS — I639 Cerebral infarction, unspecified: Secondary | ICD-10-CM | POA: Diagnosis not present

## 2022-09-19 NOTE — Progress Notes (Signed)
Carelink Summary Report / Loop Recorder 

## 2022-10-14 ENCOUNTER — Telehealth: Payer: Medicare HMO | Admitting: Behavioral Health

## 2022-10-14 ENCOUNTER — Encounter: Payer: Self-pay | Admitting: Behavioral Health

## 2022-10-14 DIAGNOSIS — G4700 Insomnia, unspecified: Secondary | ICD-10-CM

## 2022-10-14 DIAGNOSIS — F411 Generalized anxiety disorder: Secondary | ICD-10-CM | POA: Diagnosis not present

## 2022-10-14 DIAGNOSIS — R4189 Other symptoms and signs involving cognitive functions and awareness: Secondary | ICD-10-CM

## 2022-10-14 NOTE — Progress Notes (Signed)
Carelink Summary Report / Loop Recorder 

## 2022-10-14 NOTE — Progress Notes (Signed)
[name] [name] [id number] [birth year] 82 y.o.  Virtual Visit via Video Note  I connected with pt @ on 10/14/22 at  1:30 PM EDT by a video enabled telemedicine application and verified that I am speaking with the correct person using two identifiers.   I discussed the limitations of evaluation and management by telemedicine and the availability of in person appointments. The patient expressed understanding and agreed to proceed.  I discussed the assessment and treatment plan with the patient. The patient was provided an opportunity to ask questions and all were answered. The patient agreed with the plan and demonstrated an understanding of the instructions.   The patient was advised to call back or seek an in-person evaluation if the symptoms worsen or if the condition fails to improve as anticipated.  I provided 30 minutes of non-face-to-face time during this encounter.  The patient was located at home.  The provider was located at [facility].   Joan Flores, NP   Subjective:   Patient ID:  [name] [name] is a 83 y.o. (DOB [birth year]) female.  Chief Complaint:  Chief Complaint  Patient presents with   Anxiety   Depression   Medication Refill   Follow-up   Patient Education    HPI  [name] [name] presents via video visit for follow-up and medication management. She is alert and cognitively sound today. She is smiling and a little brighter today. Says medications are still working well. They do not want to adjust medication this visit and would like to continue on this regimen. Requesting next f/u in 6 months. Will call sooner if changes occur.   Anxiety level today is 2/10 and depression is 2/10. She is sleeping with aid of medications 7-8 hours per night. No mania or psychosis present. No auditory or visual hallucinations. No paranoia or delusional thinking. No SI/HI.   Review of Systems:  Review of Systems  Medications: I have reviewed the patient's current  medications.  Current Outpatient Medications  Medication Sig Dispense Refill   atenolol (TENORMIN) 25 MG tablet TAKE 1 TABLET BY MOUTH 2 TIMES DAILY. 180 tablet 1   atorvastatin (LIPITOR) 40 MG tablet TAKE ONE TABLET (40 MG) BY MOUTH EVERY DAY 90 tablet 3   Cholecalciferol (VITAMIN D3) 25 MCG (1000 UT) CAPS Take 1 capsule (1,000 Units total) by mouth daily.     colestipol (COLESTID) 1 g tablet Take two tablets in the am and 1 tablet  in the evening:  Instructions to hold Colestid if no BM in 24 hours and to reduce to once daily if stools become too hard.  Not to take Colestid within 4 hours of any other medications. 90 tablet 3   donepezil (ARICEPT) 5 MG tablet TAKE 1 TABLET BY MOUTH DAILY. 90 tablet 1   OLANZapine (ZYPREXA) 10 MG tablet TAKE 1 TABLET BY MOUTH NIGHTLY 30 tablet 5   sertraline (ZOLOFT) 100 MG tablet Take 2 tablets (200 mg total) by mouth daily. 60 tablet 5   thiamine 100 MG tablet Take 1 tablet (100 mg total) by mouth daily. 90 tablet 1   ticagrelor (BRILINTA) 90 MG TABS tablet Take 1 tablet (90 mg total) by mouth 2 (two) times daily. Via [place] & [place] pt assistance 180 tablet 1   No current facility-administered medications for this visit.    Medication Side Effects: None  Allergies:  Allergies  Allergen Reactions   Hydrocodone Nausea Only    Noted after surgery, may be able to tolerate with  food    Past Medical History:  Diagnosis Date   Arthritis    knees   Depression    Fatty liver    GERD (gastroesophageal reflux disease)    Hyperlipidemia    Hypertension    IBS (irritable bowel syndrome)    Obesity    Pneumonia    Sleep apnea    uses CPAP   Stroke (cerebrum) (HCC)     Family History  Problem Relation Age of Onset   Heart attack Mother    Dementia Father    Heart attack Father 14   Aortic aneurysm Brother    Stroke Paternal Grandfather    Colon cancer Neg Hx    Breast cancer Neg Hx     Social History   Socioeconomic History   Marital status:  Widowed    Spouse name: Not on file   Number of children: 3   Years of education: 37   Highest education level: Not on file  Occupational History   Occupation: retired  Tobacco Use   Smoking status: Never   Smokeless tobacco: Never  Vaping Use   Vaping Use: Never used  Substance and Sexual Activity   Alcohol use: Not Currently    Comment: ocassional glass of wine 2-3 times a year   Drug use: No   Sexual activity: Not Currently  Other Topics Concern   Not on file  Social History Narrative   Lives alone in home   Right Handed   Drinks 1-2 cups caffeine daily   Social Determinants of Health   Financial Resource Strain: Low Risk  (05/14/2022)   Overall Financial Resource Strain (CARDIA)    Difficulty of Paying Living Expenses: Not very hard  Food Insecurity: No Food Insecurity (05/14/2022)   Hunger Vital Sign    Worried About Running Out of Food in the Last Year: Never true    Ran Out of Food in the Last Year: Never true  Transportation Needs: No Transportation Needs (05/14/2022)   PRAPARE - Administrator, Civil Service (Medical): No    Lack of Transportation (Non-Medical): No  Physical Activity: Insufficiently Active (05/14/2022)   Exercise Vital Sign    Days of Exercise per Week: 3 days    Minutes of Exercise per Session: 10 min  Stress: No Stress Concern Present (05/14/2022)   [employer] of Occupational Health - Occupational Stress Questionnaire    Feeling of Stress : Not at all  Social Connections: Socially Isolated (05/14/2022)   Social Connection and Isolation Panel [NHANES]    Frequency of Communication with Friends and Family: More than three times a week    Frequency of Social Gatherings with Friends and Family: More than three times a week    Attends Religious Services: Never    Database administrator or Organizations: No    Attends Banker Meetings: Never    Marital Status: Widowed  Intimate Partner Violence: Not At Risk  (05/14/2022)   Humiliation, Afraid, Rape, and Kick questionnaire    Fear of Current or Ex-Partner: No    Emotionally Abused: No    Physically Abused: No    Sexually Abused: No    Past Medical History, Surgical history, Social history, and Family history were reviewed and updated as appropriate.   Please see review of systems for further details on the patient's review from today.   Objective:   Physical Exam:  There were no vitals taken for this visit.  Physical Exam  Lab  Review:     Component Value Date/Time   NA 142 11/08/2021 1515   NA 141 05/31/2015 1136   NA 143 11/26/2012 1630   K 4.1 11/08/2021 1515   K 4.3 11/26/2012 1630   CL 107 11/08/2021 1515   CL 110 (H) 11/26/2012 1630   CO2 23 11/08/2021 1515   CO2 27 11/26/2012 1630   GLUCOSE 84 11/08/2021 1515   GLUCOSE 97 11/26/2012 1630   BUN 12 11/08/2021 1515   BUN 15 05/31/2015 1136   BUN 14 11/26/2012 1630   CREATININE 0.89 11/08/2021 1515   CALCIUM 10.1 11/08/2021 1515   CALCIUM 9.1 11/26/2012 1630   PROT 6.2 11/08/2021 1515   PROT 6.5 05/31/2015 1136   PROT 6.7 11/26/2012 1630   ALBUMIN 4.3 10/26/2020 2152   ALBUMIN 4.2 05/31/2015 1136   ALBUMIN 3.7 11/26/2012 1630   AST 17 11/08/2021 1515   AST 33 11/26/2012 1630   ALT 10 11/08/2021 1515   ALT 27 11/26/2012 1630   ALKPHOS 146 (H) 10/26/2020 2152   ALKPHOS 96 11/26/2012 1630   BILITOT 0.5 11/08/2021 1515   BILITOT 0.4 05/31/2015 1136   BILITOT 0.3 11/26/2012 1630   GFRNONAA >60 10/26/2020 2152   GFRNONAA >60 11/26/2012 1630   GFRAA >60 02/18/2020 0723   GFRAA >60 11/26/2012 1630       Component Value Date/Time   WBC 6.9 11/08/2021 1515   RBC 4.77 11/08/2021 1515   HGB 14.7 11/08/2021 1515   HGB 15.7 05/31/2015 1136   HCT 42.5 11/08/2021 1515   HCT 45.0 05/31/2015 1136   PLT 233 11/08/2021 1515   PLT 280 05/31/2015 1136   MCV 89.1 11/08/2021 1515   MCV 89 05/31/2015 1136   MCV 91 11/26/2012 1630   MCH 30.8 11/08/2021 1515   MCHC 34.6  11/08/2021 1515   RDW 12.2 11/08/2021 1515   RDW 12.6 05/31/2015 1136   RDW 12.8 11/26/2012 1630   LYMPHSABS 1,297 11/08/2021 1515   LYMPHSABS 2.0 05/31/2015 1136   MONOABS 0.8 10/26/2020 2151   EOSABS 110 11/08/2021 1515   EOSABS 0.2 05/31/2015 1136   BASOSABS 48 11/08/2021 1515   BASOSABS 0.1 05/31/2015 1136    No results found for: "POCLITH", "LITHIUM"   No results found for: "PHENYTOIN", "PHENOBARB", "VALPROATE", "CBMZ"   .res Assessment: Plan:    To continue  Zyprexa 10 mg daily To increase Zoloft  to 200 mg daily. Will report worsening symptoms, acute further decline in cognition or behaviors. Provided after hours emergency contact information Discussed potential metabolic side effects associated with atypical antipsychotics, as well as potential risk for movement side effects. Advised pt to contact office if movement side effects occur.  Greater than 50% of 30  min. Video visit time with patient was spent on counseling and coordination of care. No changes this visit.  Pt and daughter are very happy with medication right now. We discussed her recent improvement with anxiety and depression. Paranoia and delusional thinking has been absent for several months now.  Daughter indicated that no medication changes desired at this time. Dicussed the possibility of lowering her Abilify to 5 mg next visit if she is still stable. Discussed potential metabolic side effects associated with atypical antipsychotics, as well as potential risk for movement side effects. Advised pt to contact office if movement side effects occur.        There are no diagnoses linked to this encounter.   Please see After Visit Summary for patient specific instructions.  Future Appointments  Date Time Provider Department Center  10/21/2022  7:30 AM [facility] DEVICE REMOTES [facility]  11/24/2022  7:05 AM [facility] DEVICE REMOTES [facility]  12/29/2022  7:05 AM [facility] DEVICE  REMOTES [facility]  02/02/2023  7:00 AM [facility] DEVICE REMOTES [facility]  02/23/2023 10:45 AM McCue, Shanda Bumps, NP [facility] None  03/09/2023  7:00 AM [facility] DEVICE REMOTES [facility]    No orders of the defined types were placed in this encounter.     -------------------------------

## 2022-10-16 LAB — CUP PACEART REMOTE DEVICE CHECK
Date Time Interrogation Session: 20240522230724
Implantable Pulse Generator Implant Date: 20220126

## 2022-10-21 ENCOUNTER — Ambulatory Visit (INDEPENDENT_AMBULATORY_CARE_PROVIDER_SITE_OTHER): Payer: Medicare HMO

## 2022-10-21 DIAGNOSIS — I639 Cerebral infarction, unspecified: Secondary | ICD-10-CM

## 2022-11-13 NOTE — Progress Notes (Signed)
Carelink Summary Report / Loop Recorder 

## 2022-11-18 LAB — CUP PACEART REMOTE DEVICE CHECK
Date Time Interrogation Session: 20240624230422
Implantable Pulse Generator Implant Date: 20220126

## 2022-11-19 ENCOUNTER — Other Ambulatory Visit: Payer: Self-pay | Admitting: Family Medicine

## 2022-11-20 ENCOUNTER — Other Ambulatory Visit: Payer: Self-pay | Admitting: Behavioral Health

## 2022-11-20 DIAGNOSIS — F411 Generalized anxiety disorder: Secondary | ICD-10-CM

## 2022-11-20 DIAGNOSIS — F331 Major depressive disorder, recurrent, moderate: Secondary | ICD-10-CM

## 2022-11-20 NOTE — Telephone Encounter (Signed)
Refill done for 90 days. Pt needs CPE (AWV pt 2) with Dr Para March prior to the end of the 90 days. Labs also. Thanks

## 2022-11-20 NOTE — Telephone Encounter (Signed)
Patient will have daughter call back and schedule for her.

## 2022-11-24 ENCOUNTER — Ambulatory Visit: Payer: Medicare HMO

## 2022-12-08 ENCOUNTER — Encounter: Payer: Self-pay | Admitting: Family Medicine

## 2022-12-08 ENCOUNTER — Ambulatory Visit (INDEPENDENT_AMBULATORY_CARE_PROVIDER_SITE_OTHER): Payer: Medicare HMO | Admitting: Family Medicine

## 2022-12-08 VITALS — BP 120/70 | HR 65 | Temp 98.7°F | Ht 60.0 in | Wt 127.0 lb

## 2022-12-08 DIAGNOSIS — W19XXXA Unspecified fall, initial encounter: Secondary | ICD-10-CM | POA: Diagnosis not present

## 2022-12-08 DIAGNOSIS — F4323 Adjustment disorder with mixed anxiety and depressed mood: Secondary | ICD-10-CM

## 2022-12-08 DIAGNOSIS — E7800 Pure hypercholesterolemia, unspecified: Secondary | ICD-10-CM

## 2022-12-08 DIAGNOSIS — Z7189 Other specified counseling: Secondary | ICD-10-CM

## 2022-12-08 DIAGNOSIS — Z0000 Encounter for general adult medical examination without abnormal findings: Secondary | ICD-10-CM

## 2022-12-08 DIAGNOSIS — R197 Diarrhea, unspecified: Secondary | ICD-10-CM

## 2022-12-08 DIAGNOSIS — G4733 Obstructive sleep apnea (adult) (pediatric): Secondary | ICD-10-CM

## 2022-12-08 DIAGNOSIS — I10 Essential (primary) hypertension: Secondary | ICD-10-CM

## 2022-12-08 DIAGNOSIS — E785 Hyperlipidemia, unspecified: Secondary | ICD-10-CM

## 2022-12-08 DIAGNOSIS — R4189 Other symptoms and signs involving cognitive functions and awareness: Secondary | ICD-10-CM

## 2022-12-08 DIAGNOSIS — Y92009 Unspecified place in unspecified non-institutional (private) residence as the place of occurrence of the external cause: Secondary | ICD-10-CM

## 2022-12-08 MED ORDER — DONEPEZIL HCL 5 MG PO TABS: 5.0000 mg | ORAL_TABLET | Freq: Every day | ORAL | 3 refills | Status: DC

## 2022-12-08 NOTE — Progress Notes (Signed)
I have personally reviewed the Medicare Annual Wellness questionnaire and have noted 1. The patient's medical and social history 2. Their use of alcohol, tobacco or illicit drugs 3. Their current medications and supplements 4. The patient's functional ability including ADL's, fall risks, home safety risks and hearing or visual             impairment. 5. Diet and physical activities 6. Evidence for depression or mood disorders  The patients weight, height, BMI have been recorded in the chart and visual acuity is per eye clinic.  I have made referrals, counseling and provided education to the patient based review of the above and I have provided the pt with a written personalized care plan for preventive services.  Provider list updated- see scanned forms.  Routine anticipatory guidance given to patient.  See health maintenance. The possibility exists that previously documented standard health maintenance information may have been brought forward from a previous encounter into this note.  If needed, that same information has been updated to reflect the current situation based on today's encounter.    Flu discussed with patient Shingles discussed with patient PNA previously done Tetanus 2017 COVID-vaccine previously done Colon cancer screening not due given her age Breast cancer screening pending 2024 Bone density test 2021 Advance directive-daughter Patrice Paradise designated if patient were incapacitated. Cognitive function addressed- see scanned forms- and if abnormal then additional documentation follows.   In addition to [facility] Wellness, follow up visit for the below conditions:  Larey Seat about 2 weeks ago.  ~2AM in the bathroom, called family to help her up.  Living alone.  Has an alert button but doesn't wear it, discussed.  Bruised R elbow.  Fall cautions discussed with patient.  Discussed home safety and her current needs.  I asked her to talk with her family about what her plans would be  going forward in case she had a change in status.  Hypertension:    Using medication without problems or lightheadedness: can get lightheaded on standing.  Chest pain with exertion:no Edema:no Short of breath:no  Elevated Cholesterol: Using medications without problems:yes Muscle aches:  Diet compliance: d/w pt. Family is cooking for patient.   Exercise: d/w pt.  Limited by gait.   Memory d/w pt.  On aricept.  Some lapses.  No red flag events.  Cautions d/w pt.  Compliant with meds.  Uses reminder to take her meds.    Has BiPaP for sleep apnea.  Compliance helps with mentation.  Discussed.    Colestipol helped with diarrhea.  Advised to avoid greasy foods.   Mood d/w pt.  "Worries" d/w pt.  Still on sertraline and zyprexa at baseline per outside clinic.    PMH and SH reviewed  Meds, vitals, and allergies reviewed.   ROS: Per HPI.  Unless specifically indicated otherwise in HPI, the patient denies:  General: fever. Eyes: acute vision changes ENT: sore throat Cardiovascular: chest pain Respiratory: SOB GI: vomiting GU: dysuria Musculoskeletal: acute back pain Derm: acute rash Neuro: acute motor dysfunction Psych: worsening mood Endocrine: polydipsia Heme: bleeding Allergy: hayfever  GEN: nad, alert and oriented HEENT: mucous membranes moist NECK: supple w/o LA CV: rrr. PULM: ctab, no inc wob ABD: soft, +bs EXT: no edema SKIN: no acute rash Bruise right elbow.

## 2022-12-08 NOTE — Patient Instructions (Addendum)
Stop atenolol and see if you are less lightheaded.  Update me as needed about your BP and pulse.  Take care.  Glad to see you.  You can call for a mammogram at the [facility] [address] [place] [address] [place]  Go to the lab on the way out.   If you have mychart we'll likely use that to update you.

## 2022-12-09 LAB — COMPREHENSIVE METABOLIC PANEL
ALT: 10 U/L (ref 0–35)
AST: 17 U/L (ref 0–37)
Albumin: 4.3 g/dL (ref 3.5–5.2)
Alkaline Phosphatase: 110 U/L (ref 39–117)
BUN: 25 mg/dL — ABNORMAL HIGH (ref 6–23)
CO2: 29 mEq/L (ref 19–32)
Calcium: 10.3 mg/dL (ref 8.4–10.5)
Chloride: 108 mEq/L (ref 96–112)
Creatinine, Ser: 0.84 mg/dL (ref 0.40–1.20)
GFR: 65.72 mL/min (ref >60.00)
Glucose, Bld: 90 mg/dL (ref 70–99)
Potassium: 4.4 mEq/L (ref 3.5–5.1)
Sodium: 142 mEq/L (ref 135–145)
Total Bilirubin: 0.6 mg/dL (ref 0.2–1.2)
Total Protein: 6.4 g/dL (ref 6.0–8.3)

## 2022-12-09 LAB — CBC WITH DIFFERENTIAL/PLATELET
Basophils Absolute: 0.1 10*3/uL (ref 0.0–0.1)
Basophils Relative: 1 % (ref 0.0–3.0)
Eosinophils Absolute: 0.1 10*3/uL (ref 0.0–0.7)
Eosinophils Relative: 1.6 % (ref 0.0–5.0)
HCT: 41.5 % (ref 36.0–46.0)
Hemoglobin: 13.8 g/dL (ref 12.0–15.0)
Lymphocytes Relative: 16.3 % (ref 12.0–46.0)
Lymphs Abs: 1.2 10*3/uL (ref 0.7–4.0)
MCHC: 33.4 g/dL (ref 30.0–36.0)
MCV: 92.2 fl (ref 78.0–100.0)
Monocytes Absolute: 0.5 10*3/uL (ref 0.1–1.0)
Monocytes Relative: 6.4 % (ref 3.0–12.0)
Neutro Abs: 5.6 10*3/uL (ref 1.4–7.7)
Neutrophils Relative %: 74.7 % (ref 43.0–77.0)
Platelets: 229 10*3/uL (ref 150.0–400.0)
RBC: 4.5 Mil/uL (ref 3.87–5.11)
RDW: 13.5 % (ref 11.5–15.5)
WBC: 7.6 10*3/uL (ref 4.0–10.5)

## 2022-12-09 LAB — LIPID PANEL
Cholesterol: 135 mg/dL (ref 0–200)
HDL: 50 mg/dL (ref >39.00)
LDL Cholesterol: 58 mg/dL (ref 0–99)
NonHDL: 84.68
Total CHOL/HDL Ratio: 3
Triglycerides: 133 mg/dL (ref 0.0–149.0)
VLDL: 26.6 mg/dL (ref 0.0–40.0)

## 2022-12-09 LAB — TSH: TSH: 1.95 u[IU]/mL (ref 0.35–5.50)

## 2022-12-11 DIAGNOSIS — Y92009 Unspecified place in unspecified non-institutional (private) residence as the place of occurrence of the external cause: Secondary | ICD-10-CM | POA: Insufficient documentation

## 2022-12-11 NOTE — Assessment & Plan Note (Signed)
Continue Zyprexa and sertraline per outside clinic.

## 2022-12-11 NOTE — Assessment & Plan Note (Signed)
Has been lightheaded.  Hold atenolol for now and update me about how she is feeling.  See after visit summary.

## 2022-12-11 NOTE — Assessment & Plan Note (Signed)
History of, improved with colestipol.  Continue as is.

## 2022-12-11 NOTE — Assessment & Plan Note (Signed)
Memory loss noted.  Continue Aricept.  Home safety discussed.

## 2022-12-11 NOTE — Assessment & Plan Note (Signed)
Continue atorvastatin

## 2022-12-11 NOTE — Assessment & Plan Note (Signed)
Larey Seat about 2 weeks ago.  ~2AM in the bathroom, called family to help her up.  Living alone.  Has an alert button but doesn't wear it, discussed.  Bruised R elbow.  Fall cautions discussed with patient.  Discussed home safety and her current needs.  I asked her to talk with her family about what her plans would be going forward in case she had a change in status.

## 2022-12-11 NOTE — Assessment & Plan Note (Signed)
Flu discussed with patient Shingles discussed with patient PNA previously done Tetanus 2017 COVID-vaccine previously done Colon cancer screening not due given her age Breast cancer screening pending 2024 Bone density test 2021 Advance directive-daughter Patrice Paradise designated if patient were incapacitated. Cognitive function addressed- see scanned forms- and if abnormal then additional documentation follows.

## 2022-12-11 NOTE — Assessment & Plan Note (Signed)
Continue BiPAP.

## 2022-12-11 NOTE — Assessment & Plan Note (Signed)
Advance directive-daughter [name] [name] designated if patient were incapacitated.

## 2022-12-15 ENCOUNTER — Encounter: Payer: Self-pay | Admitting: Family Medicine

## 2022-12-18 ENCOUNTER — Other Ambulatory Visit: Payer: Self-pay | Admitting: Nurse Practitioner

## 2022-12-18 DIAGNOSIS — K529 Noninfective gastroenteritis and colitis, unspecified: Secondary | ICD-10-CM

## 2022-12-18 NOTE — Telephone Encounter (Signed)
Please advise 

## 2022-12-22 ENCOUNTER — Ambulatory Visit (INDEPENDENT_AMBULATORY_CARE_PROVIDER_SITE_OTHER): Payer: Medicare HMO

## 2022-12-22 DIAGNOSIS — I639 Cerebral infarction, unspecified: Secondary | ICD-10-CM

## 2022-12-22 LAB — CUP PACEART REMOTE DEVICE CHECK
Date Time Interrogation Session: 20240727230517
Implantable Pulse Generator Implant Date: 20220126

## 2022-12-29 ENCOUNTER — Ambulatory Visit: Payer: Medicare HMO

## 2023-01-07 NOTE — Progress Notes (Signed)
Carelink Summary Report / Loop Recorder 

## 2023-01-24 LAB — CUP PACEART REMOTE DEVICE CHECK
Date Time Interrogation Session: 20240829230447
Implantable Pulse Generator Implant Date: 20220126

## 2023-01-27 ENCOUNTER — Ambulatory Visit (INDEPENDENT_AMBULATORY_CARE_PROVIDER_SITE_OTHER): Payer: Medicare HMO

## 2023-01-27 DIAGNOSIS — I639 Cerebral infarction, unspecified: Secondary | ICD-10-CM | POA: Diagnosis not present

## 2023-02-02 ENCOUNTER — Ambulatory Visit: Payer: Medicare HMO

## 2023-02-04 NOTE — Progress Notes (Signed)
Carelink Summary Report / Loop Recorder 

## 2023-02-19 NOTE — Progress Notes (Deleted)
[facility] [address] [place] [address] [phone]       STROKE FOLLOW UP NOTE  [name] Date of Birth:  1940/06/16 Medical Record Number:  [id number]   Primary neurologist: Dr. Pearlean [name] Sleep neurologist: Dr. Frances [name] Reason for visit: OSA on CPAP, hx of stroke and cognitive impairment    SUBJECTIVE:   CHIEF COMPLAINT:  No chief complaint on file.    HPI:   Update 02/23/2023 JM: returns for follow up visit accompanied by her daughter.         History provided for reference purposes only  Update 08/18/2022 JM: Patient returns for routine follow-up accompanied by her daughter.  Stable from stroke standpoint without new stroke/TIA symptoms.  Patient feels like her cognition and memory has been relatively stable, per daughter some decline noted.  MMSE today 25/30 (prior 23/30).  Continues on Aricept 5 mg nightly.  Compliant on Brilinta and atorvastatin.  Blood pressure well-controlled.  Loop recorder has not shown atrial fibrillation thus far.  Reports use of BiPAP most nights, daughter needs to remind her to use. Reports tolerating well but does have leaks which can be bothersome.  Compliance report over the past 30 days shows 24 out of 30 usage days with 15 days greater than 4 hours for 50% compliance.  Residual AHI 8.6 on pressure settings of 9/5 with respiratory rate 12.  Update 05/21/2022 JM: Patient returns for initial BiPAP compliance visit.  She is accompanied by her daughter.  Download report as noted below.  She does report occasional difficulty using greater than 4 hours due to some difficulty tolerating and other times may wake up in the middle the night to go to the bathroom and will forget to place mask back on. Daughter does note improvement of cognition after a night of using BiPAP.  Patient feels improvement of her sleep with use.  Epworth Sleepiness Scale 4/24.  No further questions or concerns regarding BiPAP.  Cognition  relatively stable, does have good days/bad days  Denies any new stroke/TIA symptoms.  Compliant on all medications.  Blood pressure well-controlled.  Routinely follows with PCP Dr. Para [name].       Update 02/06/2022 JM: Patient returns for 58-month stroke follow-up accompanied by her daughter  She has been stable from a stroke standpoint without new stroke/TIA symptoms.  Remains on Brilinta and atorvastatin.  Blood pressure well controlled.  Loop recorder has not shown atrial fibrillation thus far.  Cognition 23/30 (prior 25/30) Feels like her memory has been gradually declining, reports she can be "confused" at time (such as drawing a clock or unable to remember what she is supposed to be doing).  Mornings can be worse.  Still underlying anxiety as well as to be followed by behavioral health with medication changes back in June, per daughter believes anxiety/depression has been improving Remains on Aricept, denies side effects She continues to live alone maintains ADLs independently.  Family checks on her routinely.  Recently repeated sleep study which showed severe sleep apnea with a primary central component and recommended treatment with BiPAP after completion of titration study, prior use of CPAP.  She is currently awaiting for machine.  She is scheduled to be seen with Dr. Frances [name] 12/26 for initial follow-up.  She does report using her CPAP machine still but unable to use throughout the whole duration of the night.  No further concerns at this time  Update 08/06/2021 JM: 82 year old female with history of multiple recurrent strokes and MCI.  Returns today for 88-month follow-up visit accompanied by her daughter, [name] [name].  Feels more shaky/unsteady while walking which has been a gradual decline since prior visit.  Besides doing things simple chores around her home, there is no routine activity or exercise.  She continues to use a roller walker, did have fall 2 weeks ago after waking up in recliner  around 1am and while trying to walk to her bed, she fell on her bottom, did not hit head, no injury. Denies any other falls. Poor appetite but daughter ensures she eats appropriately. Sleeping well more recently. Occasional use of CPAP, at times can have difficulty putting mask on.  Denies new stroke/TIA symptoms.  Remains on Brilinta and atorvastatin without side effects.  Blood pressure today 136/74.  Loop recorder has not shown atrial fibrillation thus far.  Cognition stable with good days/bad days.  Remains on Aricept, denies side effects.  Depression and anxiety currently on Zyprexa and Zoloft, closely monitored and managed by behavioral health with reported improvement since prior visit per [facility] OV ntoe but per patient, no significant improvement - does get sad regarding loss of independence since her strokes.   Living alone and able to maintain ADLs independently.  Does have supportive family who assists with majority of IADLs.  Does do simple cleaning and cooking.  No further concerns at this time.  Update 02/04/2021 JM: [name] [name] returns for 24-month follow-up accompanied by her daughter, [name] [name].  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  Started on Zyprexa and donepezil and titrated home dose Zoloft.  She refused MRI during admission for further clarification of possible vascular dementia.  She also had a fall while hospitalized resulting in [facility] - held Brilinta for 2 weeks per neurosurgery recommendations.  Also treated for UTI.  MOCA 8/15 23/30.  Recommended 24/7 care or placement in assisted living.  She was ultimately discharged home with family.  Since discharge, she has been doing well.  Daughter reports improvement of paranoia and psychosis on current regimen.  Family was initially providing 24/7 supervision but as she was doing well  continue ADLs majority of IADLs independently, family started to leave her by herself a couples hrs a day since last week. She did have a fall yesterday with right leg giving out but thankfully without injury.  She is currently working with [facility] PT for strengthening and gait as well as lower back exercises and use of Rollator walker at all times.  Daughter still assists with medication and bill paying.  Cognition overall stable.  MMSE today 25/30.  Remains on Aricept.  Also remains on Brilinta and atorvastatin.  Blood pressure today 143/83.  No further concerns at this time   Update 11/05/2020 Dr. Pearlean [name]: She returns for follow-up after last visit with Shanda Bumps nurse practitioner 6 weeks ago.  She is accompanied by her daughter.  Their main complaint today is her memory loss which she has had since stroke.  Her short-term memory is poor.  She is also continues to have problems with anxiety and paranoia.  She is currently being managed by [facility] psychiatry.  She initially tried Lexapro and Zoloft without benefit.  She is currently taking Seroquel at night but mostly to help her sleep.  She still having some mild anxiety and paranoid ideation.  She continues to have mild weakness in the right leg as well as left hand digits  getting better.  She is tolerating Brilinta well without any bruising.  Blood pressures well controlled today it is 131/86.  She is tolerating Lipitor well without muscle aches and pains.  Patient is having short-term memory difficulties ever since her stroke which is not getting better.  Her daughter started helping her do her bills and finances.  She still lives alone and is independent.  She has not had any work-up for reversible causes of memory loss.  Update 09/12/2020, Ihor Austin, NP ) [name] [name] returns for acute visit regarding new onset symptoms accompanied by her daughter  Approx 1 month ago, she suffered a fall after her right leg gave out.  She was fully evaluated in ED who  felt likely due to chronic knee pain but per family request, CT head and MRI brain completed which was unremarkable for acute stroke.  Since that time, daughter has noticed worsening memory with increased times of confusion, forgetfulness and unsteadiness. Per daughter, symptoms onset around the time aspirin was discontinued.  Patient has chronic history of low back pain previously seen by Dr. Ethelene Hal in 10/2019 but no recent follow-up as this was relatively stable but over the past month, pain has been more consistent and more frequent episodes of bilateral hip weakness sensation and right leg weakness typically occurring after prolonged ambulation.  She does have scheduled visit with Dr. Ethelene Hal on 4/28 to undergo nerve block injections.   She does continue to live independently but her daughter checks on her routinely and assists with IADLs.  She has been greatly struggling with anxiety over the past month.  She does have history of anxiety but recently greatly worsened.  PCP recently started Lexapro and recommended increasing to 15 mg daily but she is very hesitant to increase as she read online Lexapro can worsen anxiety.  She is also been struggling with insomnia as her mind will constantly wander with hydroxyzine prescribed by PCP but she denies much benefit.  She speaks of difficulty with her relationships as she has been limiting social interactions due to her low back pain and fear of her right leg giving out - she also feels like she is constantly being judged by her friends or worried about what they are thinking of her.  She is concerned if she misses a social event due to her low back pain and what sounds like social anxiety.  She is also fearful of undergoing nerve block injections as she is on Brilinta and feels like this should be discontinued prior to injection but was told by Dr. Ethelene Hal nurse assistant that that was not necessary.  Of note, seen by PCP Dr. Para [name] 4/5 regarding these concerns who  felt multifactorial and likely not new acute strokes.  She was also treated for shingles with Valtrex.  She has remained on Brilinta and atorvastatin without associated side effects Blood pressure 142/86 -occasionally monitored at home and typically stable She does endorse nightly compliance with CPAP for OSA management Loop recorder has not shown atrial fibrillation thus far  No further concerns at this time   Update 07/04/2020 JM: [name] [name] returns for scheduled follow-up regarding prior strokes accompanied by her daughter. She has been stable since prior visit without any additional stroke/TIA symptoms. Reports residual left hand weakness and gait impairment. She will use RW at times especially upon awakening but otherwise doesn't need AD. She denies any recent falls.  She has remained on Brilinta, aspirin and atorvastatin 80 mg daily without side effects.  28-month aspirin and Brilinta duration will be completed at the end of this month.  Blood pressure today 121/74.  Monitors at home and typically stable.  Reports ongoing compliance with CPAP followed by Dr. Frances [name]. Due to concern of multiple strokes etiology cryptogenic, loop recorder placed by Dr. Johney Frame on 06/20/2020 for further long-term monitoring of atrial fibrillation.  No further concerns at this time.  Update 05/14/2020 JM: [name] [name] returns per request to follow-up on recent hospitalizations.  She is accompanied by her daughter.  Personally reviewed recent hospitalizations with summary provided below.  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  P2 Y 12 4 showing inadequate Plavix function therefore switched to aspirin 81 mg daily and Brilinta 90 mg twice daily for 3 months and then continue on Brilinta alone.  Initially recommended discharge to CIR but due to functional  improvement she was discharged home on 04/23/2020 with home health therapies.  She then presented to [facility] ED on 05/08/2020 for 1 wk hx of intermittent LUE and LLE weakness, numbness/tingling and right-sided headache but due to wait time she left prior to full evaluation and went to [facility] ED with MRI reporting acute to early subacute infarct in the left centrum semiovale which was not consistent with presenting symptoms.  Per further review with neurologist, it was felt as though reported new infarct seen on prior MRIs therefore no evidence of new stroke.  Evaluated by neurology with note personally reviewed and felt possible cause of symptoms in setting of hypoperfusion with known intracranial arthrosclerosis and atenolol dosage decreased.  She then returned to [facility] ED yesterday, 05/13/2020, for left lower extremity heaviness, palpitations and headache.  Found to be hypertensive with BP 196/105.  No neurological deficits noted.  Symptoms resolved without intervention.  Mention of increased anxiety possibly contributing to symptoms.  CT head negative.  She was discharged back home in stable condition.  She reports continued intermittent b/l hand weakness where she will occasionally drop items and continued gait impairment currently working with [facility] PT. currently using rolling walker for ambulation.  Denies new or worsening stroke/TIA symptoms.  Remains on aspirin 81 mg daily and Brilinta without bleeding or bruising.  Remains on atorvastatin 80 mg daily without myalgias.  Blood pressure today 128/75.  She does routinely monitor at home but she questions accuracy as her results are consistently 150s.  She does report nightly use of CPAP for OSA management.  Completed approximately 14 days of cardiac monitor which was negative for atrial fibrillation.  She questions need of ILR.  She does endorse increased anxiety regarding multiple strokes and WHEN (not if) another one will occur.  No further concerns at this  time.  Update 03/01/2020 JM: [name] [name] returns for sooner scheduled visit per PCP request due to recent stroke accompanied by her daughter.  She presented to [facility] regional emergency room on 02/16/2020 with right-sided weakness contributing to fall.  MRI showed left frontal lobe infarcts likely from stenosis and left M2 all within the same vascular territory and was not felt to be embolic.  MRA head showed progression of stenosis within the distal A2/proximal artery which is now severe with additional intracranial atherosclerotic stenosis without interval change including severe focal stenosis within the cavernous left ICA, moderate focal stenosis within the cavernous right ICA, moderate stenosis with A2 right ACA and moderate stenosis within the proximal P2 left  PCA.  2D echo showed EF of 60 to 65%.  On aspirin PTA and recommended DAPT " but long-term she may need Effient instead of Plavix or Brilinta".  Recommended to check Plavix assays outpatient to ensure adequate response.  HTN stable.  LDL 54 recommended continuation of atorvastatin 80 mg daily.  She did have right knee pain secondary to fall and recommended follow-up with orthopedics outpatient.  Evaluated by therapy initially recommended [facility] PT/OT but improved prior to discharge with [facility] therapy no longer indicated.  She was discharged home in stable condition and advised to follow-up with PCP, neurology and orthopedics.    She has been stable since returning home without new or reoccurring stroke/TIA symptoms.  Denies residual right-sided symptoms but does continue to have chronic right knee pain and ankle pain post fall which has been limiting her ambulation.  She is currently working with [facility] PT and ambulating with rolling walker due to right leg pain.  Continues to have mild dysarthria and decreased LUE dexterity which has been stable after recent stroke and improvement since prior visit.  She has continued on aspirin and Plavix without bleeding or  bruising.  Remains on atorvastatin 80 mg daily without myalgias.  Blood pressure today satisfactory at 112/69.  She reports intermittent use of CPAP for sleep apnea management with difficulty tolerating due to increased pressure.  Reports undergoing sleep study over 10 years ago and has not had any repeat study or follow-up to ensure adequate management of apnea.  Multiple questions regarding recent stroke and etiology as well as recommended Plavix lab work Research scientist (medical)) as recommended by [employer] neurologist.  Further reviewed recent admission and imaging with Dr. Pearlean [name] due to concern of stroke locations and possible embolic etiology. Per Dr. Pearlean [name], he does not believe recent stroke was due to large vessel disease but more so embolic secondary to unknown source as her recent imaging showed infarcts within different vascular locations including ACA and MCA.  Also reviewed imaging from prior stroke in 06/2019 and due to size, potentially embolic. He recommends further cardiac evaluation with placement of loop recorder to assess for possible atrial fibrillation as stroke etiology.  No indication for TEE. He recommends aspirin and Plavix for only 3-week duration and then Plavix alone.  No indication for P2 Y 12 testing as she has no history or evidence of Plavix failure or concern.  Update 01/02/2020 JM: [name] [name] is being seen for stroke follow-up previously followed through [facility] research participating in [employer] stroke trial. Evaluated during stroke trial on 10/10/2019 recently completing HH therapies with residual deficits of mild decrease left hand dexterity, imbalance and dysarthria therefore additional orders placed for outpatient therapies.  She does report ongoing improvement and continues to work with neuro rehab PT/OT/ST for residual dysarthria and dysphonia, gait impairment/imbalance and LUE incoordination. Evaluated in [place] by Dr. 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  Reports continued participation SLP with [facility] Reports imbalance greatest difficulty still with improvement but also underlying chronic lower back, bilateral hip and knee pain.  She wishes to proceed with additional injections and nerve blocks therefore withdrew from research study approximately 1 month ago.  Continues to follow with [facility] She remains on aspirin 81mg  daily without bleeding or bruising. Continues on atorvastatin 80 mg daily without myalgias.  Recent lipid panel 10/13/2019 showed LDL 64.  Blood pressure today 116/70.  No further concerns.  Stroke admission 07/11/2019 Personally reviewed recent hospitalization pertinent notes, labs  and imaging [name] [name] is a 82 y.o. female with history of HTN, HLD, obesity who presented on 07/11/2019 with dysarthria, L facial droop, mild L hemiparesis as well as hypertensive urgency.  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  Recommended DAPT for 3 weeks and aspirin alone.  BP upon arrival 202/110 stabilized during admission and recommended long-term BP goal normotensive range.  LDL 130 and initiate atorvastatin 80 mg daily. Other stroke risk factors include advanced age, EtOH use, OSA on CPAP and family reported hx imaging with evidence of small AAA.  Evaluated by therapy and recommend discharge to CIR for ongoing therapy needs but unfortunately did not by insurance therefore discharged home with home health therapy.  Also advised to follow-up with [facility] research is interested in [employer] stroke trial.   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  MRI  R corona radiata and caudate head small vessel disease. infarcts CTA head & neck negative LVO, negative arthrosclerosis or stenosis in neck, positive for intracranial arthrosclerosis with significant stenosis within moderate to severe left ICA anterior  genu, moderate bilateral ICA distally to and moderate left PCA P1 2D Echo  EF 50 to 55% LDL 130 HgbA1c 5.5 Lovenox 40 mg sq daily for VTE prophylaxis No antithrombotic prior to admission, now on clopidogrel 75 mg daily and aspirin 324 mg. Change aspirin to 81. Continue DAPT x 3 weeks then aspirin alone.  Therapy recommendations: CLR -insurance denied therefore recommended HH therapy Disposition: Home Recommend aspirin and Plavix for 3 weeks followed by aspirin alone. She is  participating in the [employer] stroke prevention trial( standard of care antiplatelets and Factor xi inhibitor versus standard of care antiplates and placebo)         ROS:   14 system review of systems performed and negative with exception of see HPI   PMH:  Past Medical History:  Diagnosis Date   Arthritis    knees   Depression    Fatty liver    GERD (gastroesophageal reflux disease)    Hyperlipidemia    Hypertension    IBS (irritable bowel syndrome)    Obesity    Pneumonia    Sleep apnea    uses CPAP   Stroke (cerebrum) (HCC)     PSH:  Past Surgical History:  Procedure Laterality Date   BLADDER SURGERY     BREAST SURGERY     breast biopsy   BROW LIFT Bilateral 04/14/2017   Procedure: BLEPHAROPLASTY UPPER EYELID WITH EXCESS SKIN;  Surgeon: Imagene Riches, MD;  Location: [facility];  Service: Ophthalmology;  Laterality: Bilateral;   CATARACT EXTRACTION W/PHACO Left 02/05/2016   Procedure: CATARACT EXTRACTION PHACO AND INTRAOCULAR LENS PLACEMENT (IOC);  Surgeon: Galen Manila, MD;  Location: [facility];  Service: Ophthalmology;  Laterality: Left;  [place] 01:10 AP% 22.3 CDE 15.67 Fluid pack lot # [id number]   CATARACT EXTRACTION W/PHACO Right 02/26/2016   Procedure: CATARACT EXTRACTION PHACO AND INTRAOCULAR LENS PLACEMENT (IOC);  Surgeon: Galen Manila, MD;  Location: [facility];  Service: Ophthalmology;  Laterality: Right;  [place] 57.4 AP% 24.0 CDE 13.75 Fluid Pack lot # [id number]    CHOLECYSTECTOMY     COLONOSCOPY WITH PROPOFOL N/A 12/18/2014   Procedure: COLONOSCOPY WITH PROPOFOL;  Surgeon: Scot Jun, MD;  Location: [facility];  Service: Endoscopy;  Laterality: N/A;   DEEP NECK LYMPH NODE BIOPSY /  EXCISION     implantable loop recorder implantation  06/20/2020   Medtronic Reveal Linq model LNQ 22 ([place] [id number]) implantable loop recorder for cryptogenic stroke   JOINT REPLACEMENT     KNEE ARTHROPLASTY Left 06/20/2015   Procedure: COMPUTER ASSISTED TOTAL KNEE ARTHROPLASTY;  Surgeon: Donato Heinz, MD;  Location: [facility];  Service: Orthopedics;  Laterality: Left;   PTOSIS REPAIR Bilateral 04/14/2017   Procedure: PTOSIS REPAIR RESECT EX;  Surgeon: Imagene Riches, MD;  Location: [facility];  Service: Ophthalmology;  Laterality: Bilateral;  sleep apnea   TONSILLECTOMY     TOTAL HIP ARTHROPLASTY Right 07/21/2018   Procedure: TOTAL HIP ARTHROPLASTY ANTERIOR APPROACH;  Surgeon: Ollen Gross, MD;  Location: [facility];  Service: Orthopedics;  Laterality: Right;    Social History:  Social History   Socioeconomic History   Marital status: Widowed    Spouse name: Not on file   Number of children: 3   Years of education: 78   Highest education level: Not on file  Occupational History   Occupation: retired  Tobacco Use   Smoking status: Never   Smokeless tobacco: Never  Vaping Use   Vaping status: Never Used  Substance and Sexual Activity   Alcohol use: Not Currently    Comment: ocassional glass of wine 2-3 times a year   Drug use: No   Sexual activity: Not Currently  Other Topics Concern   Not on file  Social History Narrative   Lives alone in home   Right Handed   Drinks 1-2 cups caffeine daily   Social Determinants of Health   Financial Resource Strain: Low Risk  (05/14/2022)   Overall Financial Resource Strain (CARDIA)    Difficulty of Paying Living Expenses: Not very hard  Food Insecurity: No Food Insecurity (05/14/2022)   Hunger Vital  Sign    Worried About Running Out of Food in the Last Year: Never true    Ran Out of Food in the Last Year: Never true  Transportation Needs: No Transportation Needs (05/14/2022)   PRAPARE - Administrator, Civil Service (Medical): No    Lack of Transportation (Non-Medical): No  Physical Activity: Insufficiently Active (05/14/2022)   Exercise Vital Sign    Days of Exercise per Week: 3 days    Minutes of Exercise per Session: 10 min  Stress: No Stress Concern Present (05/14/2022)   [employer] of Occupational Health - Occupational Stress Questionnaire    Feeling of Stress : Not at all  Social Connections: Socially Isolated (05/14/2022)   Social Connection and Isolation Panel [NHANES]    Frequency of Communication with Friends and Family: More than three times a week    Frequency of Social Gatherings with Friends and Family: More than three times a week    Attends Religious Services: Never    Database administrator or Organizations: No    Attends Banker Meetings: Never    Marital Status: Widowed  Intimate Partner Violence: Not At Risk (05/14/2022)   Humiliation, Afraid, Rape, and Kick questionnaire    Fear of Current or Ex-Partner: No    Emotionally Abused: No    Physically Abused: No    Sexually Abused: No    Family History:  Family History  Problem Relation Age of Onset   Heart attack Mother    Dementia Father    Heart attack Father 42   Aortic aneurysm Brother    Stroke Paternal Grandfather    Colon cancer Neg  Hx    Breast cancer Neg Hx     Medications:   Current Outpatient Medications on File Prior to Visit  Medication Sig Dispense Refill   atorvastatin (LIPITOR) 40 MG tablet TAKE ONE TABLET (40 MG) BY MOUTH EVERY DAY 90 tablet 3   Cholecalciferol (VITAMIN D3) 25 MCG (1000 UT) CAPS Take 1 capsule (1,000 Units total) by mouth daily.     colestipol (COLESTID) 1 g tablet TAKE 2 TABS IN THE MORNING AND 1 TAB IN EVENING; HOLD IF NO BM IN 24  HOURS AND REDUCE TO ONCE DAILY OF STOOLS BECOME TOO HARD. NOT WITHIN 4 HRS OF OTHER MEDS 90 tablet 3   donepezil (ARICEPT) 5 MG tablet Take 1 tablet (5 mg total) by mouth daily. 90 tablet 3   OLANZapine (ZYPREXA) 10 MG tablet TAKE 1 TABLET BY MOUTH NIGHTLY 30 tablet 5   sertraline (ZOLOFT) 100 MG tablet TAKE 2 TABLETS BY MOUTH DAILY. 60 tablet 4   thiamine 100 MG tablet Take 1 tablet (100 mg total) by mouth daily. 90 tablet 1   ticagrelor (BRILINTA) 90 MG TABS tablet Take 1 tablet (90 mg total) by mouth 2 (two) times daily. Via [place] assistance 180 tablet 1   No current facility-administered medications on file prior to visit.    Allergies:   Allergies  Allergen Reactions   Hydrocodone Nausea Only    Noted after surgery, may be able to tolerate with food      OBJECTIVE:  Physical Exam  There were no vitals filed for this visit.  There is no height or weight on file to calculate BMI. No results found.  General: well developed, well nourished, very pleasant elderly Caucasian female, seated, in no evident distress Neck: supple with no carotid or supraclavicular bruits Cardiovascular: regular rate and rhythm, no murmurs Vascular:  Normal pulses all extremities  Neurologic Exam Mental Status: Awake and fully alert.  Fluent speech and language.  Oriented to place and time. Recent and remote memory impaired. Attention span, concentration and fund of knowledge mostly appropriate with daughter supplementing history. Mood and affect appropriate.     08/18/2022   10:35 AM 05/14/2022    3:33 PM 02/06/2022    1:12 PM  MMSE - Mini Mental State Exam  Not completed:  Unable to complete   Orientation to time 3  3  Orientation to Place 5  4  Registration 3  3  Attention/ Calculation 3  2  Recall 3  3  Language- name 2 objects 2  2  Language- repeat 1  0  Language- follow 3 step command 3  3  Language- read & follow direction 1  1  Write a sentence 1  1  Copy design 0  1  Total  score 25  23   Cranial Nerves: Pupils equal, briskly reactive to light. Extraocular movements full without nystagmus. Visual fields full to confrontation. Hearing intact. Facial sensation intact.  Tongue and palate moves normally and symmetrically.  Mild left lower facial weakness Motor: Normal bulk and tone. Normal strength in all tested extremity muscles except mild weakness in left hand and mild HF weakness Sensory.: intact to touch , pinprick , position and vibratory sensation.  Coordination: Rapid alternating movements normal in all extremities except decreased left hand. Finger-to-nose and heel-to-shin performed accurately bilaterally.  Gait and Station: Stands from seated position without difficulty.  Stance is slightly hunched.  Gait demonstrates relatively normal stride length with [name] unsteadiness and imbalance with  use of Rollator walker.  Tandem walking heel toe not attempted. Reflexes: 1+ and symmetric. Toes downgoing.       ASSESSMENT/PLAN: [name] [name] is a 82 y.o. 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  P2Y12 4 on Plavix.  2 additional ED evaluations 05/09/2020 and 05/13/2020 for worsening left-sided weakness and numbness without evidence of acute infarct contributing to symptoms and felt likely in setting of hypoperfusion with intracranial arthrosclerosis. 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      1.  Sleep apnea on BiPAP -Compliance report shows suboptimal usage with residual AHI 8.2.  Discussed importance of increasing nightly usage with ensuring greater than 4 hours per night for optimal benefit and for  insurance purposes.  Will also request mask refitting with DME company due to occasional leaks currently at 27.9 L/min. Will keep current pressure settings at this time, will re-evaluate at f/u visit with hopeful improvement of compliance. If compliance improved but AHI remains elevated, may consider pressure setting adjustments at that time.  Continue to follow with DME company [employer] for any needed supplies or CPAP related concerns.  2.  Mild cognitive impairment -Prior MMSE 25/30 (prior 23/30 01/2022) -Continue Aricept 5 mg daily managed by PCP -Continue to follow with [facility] for depression and anxiety management -Dementia panel 10/2020 unremarkable -EEG 10/2020 normal  3.  History of prior strokes -Continue Brilinta and atorvastatin for secondary stroke prevention measures -Loop recorder has not shown atrial fibrillation thus far -Maintain strict control of hypertension with blood pressure goal below 130/90, and lipids with LDL cholesterol goal below 70 mg percent     Follow-up in 6 months or call earlier if needed     I spent 36 minutes of face-to-face and non-face-to-face time with patient and daughter.  This included previsit chart review, lab review, study review, order entry, electronic health record documentation, patient and daughter discussion/education regarding above diagnoses and treatment plan and answered all other questions to patient and daughter satisfaction  CC:  Joaquim Nam, MD     Ihor Austin, AGNP-BC  [facility] [address] [place], [place] [address]  Phone [phone] Fax [phone] Note: This document was prepared with digital dictation and possible smart phrase technology. Any transcriptional errors that result from this process are unintentional.

## 2023-02-23 ENCOUNTER — Ambulatory Visit: Payer: Medicare HMO | Admitting: Adult Health

## 2023-02-25 LAB — CUP PACEART REMOTE DEVICE CHECK
Date Time Interrogation Session: 20241001230234
Implantable Pulse Generator Implant Date: 20220126

## 2023-03-02 ENCOUNTER — Ambulatory Visit (INDEPENDENT_AMBULATORY_CARE_PROVIDER_SITE_OTHER): Payer: Medicare HMO

## 2023-03-02 DIAGNOSIS — I639 Cerebral infarction, unspecified: Secondary | ICD-10-CM | POA: Diagnosis not present

## 2023-03-09 ENCOUNTER — Ambulatory Visit: Payer: Medicare HMO

## 2023-03-16 NOTE — Progress Notes (Signed)
Carelink Summary Report / Loop Recorder 

## 2023-03-27 ENCOUNTER — Other Ambulatory Visit: Payer: Self-pay | Admitting: Behavioral Health

## 2023-03-27 DIAGNOSIS — F411 Generalized anxiety disorder: Secondary | ICD-10-CM

## 2023-03-27 DIAGNOSIS — F331 Major depressive disorder, recurrent, moderate: Secondary | ICD-10-CM

## 2023-04-06 ENCOUNTER — Ambulatory Visit (INDEPENDENT_AMBULATORY_CARE_PROVIDER_SITE_OTHER): Payer: Medicare HMO

## 2023-04-06 DIAGNOSIS — I639 Cerebral infarction, unspecified: Secondary | ICD-10-CM | POA: Diagnosis not present

## 2023-04-07 ENCOUNTER — Other Ambulatory Visit: Payer: Self-pay | Admitting: Adult Health

## 2023-04-07 LAB — CUP PACEART REMOTE DEVICE CHECK
Date Time Interrogation Session: 20241110230157
Implantable Pulse Generator Implant Date: 20220126

## 2023-04-30 NOTE — Progress Notes (Signed)
Carelink Summary Report / Loop Recorder 

## 2023-05-11 ENCOUNTER — Ambulatory Visit (INDEPENDENT_AMBULATORY_CARE_PROVIDER_SITE_OTHER): Payer: Medicare HMO

## 2023-05-11 DIAGNOSIS — I639 Cerebral infarction, unspecified: Secondary | ICD-10-CM

## 2023-05-11 LAB — CUP PACEART REMOTE DEVICE CHECK
Date Time Interrogation Session: 20241215230313
Implantable Pulse Generator Implant Date: 20220126

## 2023-06-05 ENCOUNTER — Telehealth: Payer: Self-pay | Admitting: Behavioral Health

## 2023-06-05 DIAGNOSIS — F331 Major depressive disorder, recurrent, moderate: Secondary | ICD-10-CM

## 2023-06-05 DIAGNOSIS — F411 Generalized anxiety disorder: Secondary | ICD-10-CM

## 2023-06-05 MED ORDER — SERTRALINE HCL 100 MG PO TABS: 200.0000 mg | ORAL_TABLET | Freq: Every day | ORAL | 0 refills | Status: DC

## 2023-06-05 NOTE — Telephone Encounter (Signed)
 Sent sertraline RF to [facility].

## 2023-06-05 NOTE — Telephone Encounter (Signed)
 Appt is 06/18/23. Requesting refill on Sertraline 100 mg called to:  [facility] - [place], [place] - [address]   Phone: [phone]  Fax: [phone]

## 2023-06-15 ENCOUNTER — Ambulatory Visit (INDEPENDENT_AMBULATORY_CARE_PROVIDER_SITE_OTHER): Payer: Medicare HMO

## 2023-06-15 DIAGNOSIS — I639 Cerebral infarction, unspecified: Secondary | ICD-10-CM | POA: Diagnosis not present

## 2023-06-15 LAB — CUP PACEART REMOTE DEVICE CHECK
Date Time Interrogation Session: 20250119230451
Implantable Pulse Generator Implant Date: 20220126

## 2023-06-15 NOTE — Progress Notes (Signed)
Carelink Summary Report / Loop Recorder 

## 2023-06-18 ENCOUNTER — Encounter: Payer: Self-pay | Admitting: Behavioral Health

## 2023-06-18 ENCOUNTER — Telehealth: Payer: Medicare HMO | Admitting: Behavioral Health

## 2023-06-18 DIAGNOSIS — F411 Generalized anxiety disorder: Secondary | ICD-10-CM | POA: Diagnosis not present

## 2023-06-18 DIAGNOSIS — F22 Delusional disorders: Secondary | ICD-10-CM

## 2023-06-18 DIAGNOSIS — R4189 Other symptoms and signs involving cognitive functions and awareness: Secondary | ICD-10-CM

## 2023-06-18 DIAGNOSIS — F331 Major depressive disorder, recurrent, moderate: Secondary | ICD-10-CM

## 2023-06-18 MED ORDER — SERTRALINE HCL 100 MG PO TABS: 200.0000 mg | ORAL_TABLET | Freq: Every day | ORAL | 5 refills | Status: AC

## 2023-06-18 MED ORDER — OLANZAPINE 10 MG PO TABS: 10.0000 mg | ORAL_TABLET | Freq: Every day | ORAL | 5 refills | Status: DC

## 2023-06-18 NOTE — Progress Notes (Signed)
[name] [name] [id number] 05/14/1941 83 y.o.  Virtual Visit via Video Note  I connected with pt @ on 06/18/23 at 11:00 AM EST by a video enabled telemedicine application and verified that I am speaking with the correct person using two identifiers.   I discussed the limitations of evaluation and management by telemedicine and the availability of in person appointments. The patient expressed understanding and agreed to proceed.  I discussed the assessment and treatment plan with the patient. The patient was provided an opportunity to ask questions and all were answered. The patient agreed with the plan and demonstrated an understanding of the instructions.   The patient was advised to call back or seek an in-person evaluation if the symptoms worsen or if the condition fails to improve as anticipated.  I provided 30 minutes of non-face-to-face time during this encounter.  The patient was located at home.  The provider was located at [facility].   Joan Flores, NP   Subjective:   Patient ID:  [name] [name] is a 83 y.o. (DOB [birth year]) female.  Chief Complaint:  Chief Complaint  Patient presents with   Anxiety   Depression   Follow-up   Medication Refill   Memory Loss   Patient Education    HPI [name] presents via video visit for follow-up and medication management. She is alert and cognitively sound today. She is smiling and a little brighter today. Says medications are still working well. They do not want to adjust medication this visit and would like to continue on this regimen. Requesting next f/u in 6 months. She follows up with PCP and neurology regularly. Hx of strokes. Cognitive decline. Will call sooner if changes occur.   Anxiety level today is 2/10 and depression is 2/10. She is sleeping with aid of medications 7-8 hours per night. No mania or psychosis present. No auditory or visual hallucinations. No paranoia or delusional thinking. No  SI/HI.   Review of Systems:  Review of Systems  Constitutional: Negative.   Allergic/Immunologic: Negative.   Neurological: Negative.   Psychiatric/Behavioral:  Positive for dysphoric mood.     Medications: I have reviewed the patient's current medications.  Current Outpatient Medications  Medication Sig Dispense Refill   donepezil (ARICEPT) 5 MG tablet Take 1 tablet (5 mg total) by mouth daily. 90 tablet 3   OLANZapine (ZYPREXA) 10 MG tablet TAKE 1 TABLET BY MOUTH NIGHTLY 30 tablet 5   OLANZapine (ZYPREXA) 10 MG tablet Take 1 tablet (10 mg total) by mouth at bedtime. 30 tablet 5   sertraline (ZOLOFT) 100 MG tablet Take 2 tablets (200 mg total) by mouth daily. 60 tablet 0   sertraline (ZOLOFT) 100 MG tablet Take 2 tablets (200 mg total) by mouth daily. 60 tablet 5   atorvastatin (LIPITOR) 40 MG tablet TAKE ONE TABLET (40 MG) BY MOUTH EVERY DAY 90 tablet 0   Cholecalciferol (VITAMIN D3) 25 MCG (1000 UT) CAPS Take 1 capsule (1,000 Units total) by mouth daily.     colestipol (COLESTID) 1 g tablet TAKE 2 TABS IN THE MORNING AND 1 TAB IN EVENING; HOLD IF NO BM IN 24 HOURS AND REDUCE TO ONCE DAILY OF STOOLS BECOME TOO HARD. NOT WITHIN 4 HRS OF OTHER MEDS 90 tablet 3   thiamine 100 MG tablet Take 1 tablet (100 mg total) by mouth daily. 90 tablet 1   ticagrelor (BRILINTA) 90 MG TABS tablet Take 1 tablet (90 mg total) by mouth 2 (two) times daily.  [place]ia [place] pt assistance 180 tablet 1   No current facility-administered medications for this visit.    Medication Side Effects: None  Allergies:  Allergies  Allergen Reactions   Hydrocodone Nausea Only    Noted after surgery, may be able to tolerate with food    Past Medical History:  Diagnosis Date   Arthritis    knees   Depression    Fatty liver    GERD (gastroesophageal reflux disease)    Hyperlipidemia    Hypertension    IBS (irritable bowel syndrome)    Obesity    Pneumonia    Sleep apnea    uses CPAP   Stroke (cerebrum)  (HCC)     Family History  Problem Relation Age of Onset   Heart attack Mother    Dementia Father    Heart attack Father 63   Aortic aneurysm Brother    Stroke Paternal Grandfather    Colon cancer Neg Hx    Breast cancer Neg Hx     Social History   Socioeconomic History   Marital status: Widowed    Spouse name: Not on file   Number of children: 3   Years of education: 70   Highest education level: Not on file  Occupational History   Occupation: retired  Tobacco Use   Smoking status: Never   Smokeless tobacco: Never  Vaping Use   Vaping status: Never Used  Substance and Sexual Activity   Alcohol use: Not Currently    Comment: ocassional glass of wine 2-3 times a year   Drug use: No   Sexual activity: Not Currently  Other Topics Concern   Not on file  Social History Narrative   Lives alone in home   Right Handed   Drinks 1-2 cups caffeine daily   Social Drivers of Health   Financial Resource Strain: Low Risk  (05/14/2022)   Overall Financial Resource Strain (CARDIA)    Difficulty of Paying Living Expenses: Not very hard  Food Insecurity: No Food Insecurity (05/14/2022)   Hunger Vital Sign    Worried About Running Out of Food in the Last Year: Never true    Ran Out of Food in the Last Year: Never true  Transportation Needs: No Transportation Needs (05/14/2022)   PRAPARE - Administrator, Civil Service (Medical): No    Lack of Transportation (Non-Medical): No  Physical Activity: Insufficiently Active (05/14/2022)   Exercise Vital Sign    Days of Exercise per Week: 3 days    Minutes of Exercise per Session: 10 min  Stress: No Stress Concern Present (05/14/2022)   [employer] of Occupational Health - Occupational Stress Questionnaire    Feeling of Stress : Not at all  Social Connections: Socially Isolated (05/14/2022)   Social Connection and Isolation Panel [NHANES]    Frequency of Communication with Friends and Family: More than three times a  week    Frequency of Social Gatherings with Friends and Family: More than three times a week    Attends Religious Services: Never    Database administrator or Organizations: No    Attends Banker Meetings: Never    Marital Status: Widowed  Intimate Partner Violence: Not At Risk (05/14/2022)   Humiliation, Afraid, Rape, and Kick questionnaire    Fear of Current or Ex-Partner: No    Emotionally Abused: No    Physically Abused: No    Sexually Abused: No    Past Medical History, Surgical  history, Social history, and Family history were reviewed and updated as appropriate.   Please see review of systems for further details on the patient's review from today.   Objective:   Physical Exam:  There were no vitals taken for this visit.  Physical Exam Neurological:     Mental Status: She is alert and oriented to person, place, and time.  Psychiatric:        Attention and Perception: Attention and perception normal.        Mood and Affect: Mood normal.        Speech: Speech normal.        Behavior: Behavior normal. Behavior is cooperative.        Cognition and Memory: Cognition and memory normal.        Judgment: Judgment normal.     Comments: Insight intact     Lab Review:     Component Value Date/Time   NA 142 12/08/2022 1522   NA 141 05/31/2015 1136   NA 143 11/26/2012 1630   K 4.4 12/08/2022 1522   K 4.3 11/26/2012 1630   CL 108 12/08/2022 1522   CL 110 (H) 11/26/2012 1630   CO2 29 12/08/2022 1522   CO2 27 11/26/2012 1630   GLUCOSE 90 12/08/2022 1522   GLUCOSE 97 11/26/2012 1630   BUN 25 (H) 12/08/2022 1522   BUN 15 05/31/2015 1136   BUN 14 11/26/2012 1630   CREATININE 0.84 12/08/2022 1522   CREATININE 0.89 11/08/2021 1515   CALCIUM 10.3 12/08/2022 1522   CALCIUM 9.1 11/26/2012 1630   PROT 6.4 12/08/2022 1522   PROT 6.5 05/31/2015 1136   PROT 6.7 11/26/2012 1630   ALBUMIN 4.3 12/08/2022 1522   ALBUMIN 4.2 05/31/2015 1136   ALBUMIN 3.7 11/26/2012  1630   AST 17 12/08/2022 1522   AST 33 11/26/2012 1630   ALT 10 12/08/2022 1522   ALT 27 11/26/2012 1630   ALKPHOS 110 12/08/2022 1522   ALKPHOS 96 11/26/2012 1630   BILITOT 0.6 12/08/2022 1522   BILITOT 0.4 05/31/2015 1136   BILITOT 0.3 11/26/2012 1630   GFRNONAA >60 10/26/2020 2152   GFRNONAA >60 11/26/2012 1630   GFRAA >60 02/18/2020 0723   GFRAA >60 11/26/2012 1630       Component Value Date/Time   WBC 7.6 12/08/2022 1522   RBC 4.50 12/08/2022 1522   HGB 13.8 12/08/2022 1522   HGB 15.7 05/31/2015 1136   HCT 41.5 12/08/2022 1522   HCT 45.0 05/31/2015 1136   PLT 229.0 12/08/2022 1522   PLT 280 05/31/2015 1136   MCV 92.2 12/08/2022 1522   MCV 89 05/31/2015 1136   MCV 91 11/26/2012 1630   MCH 30.8 11/08/2021 1515   MCHC 33.4 12/08/2022 1522   RDW 13.5 12/08/2022 1522   RDW 12.6 05/31/2015 1136   RDW 12.8 11/26/2012 1630   LYMPHSABS 1.2 12/08/2022 1522   LYMPHSABS 2.0 05/31/2015 1136   MONOABS 0.5 12/08/2022 1522   EOSABS 0.1 12/08/2022 1522   EOSABS 0.2 05/31/2015 1136   BASOSABS 0.1 12/08/2022 1522   BASOSABS 0.1 05/31/2015 1136    No results found for: "POCLITH", "LITHIUM"   No results found for: "PHENYTOIN", "PHENOBARB", "VALPROATE", "CBMZ"   .res Assessment: Plan:    To continue  Zyprexa 10 mg daily To increase Zoloft  to 200 mg daily. Will report worsening symptoms, acute further decline in cognition or behaviors. Provided after hours emergency contact information Discussed potential metabolic side effects associated with atypical antipsychotics, as  well as potential risk for movement side effects. Advised pt to contact office if movement side effects occur.  Greater than 50% of 30  min. Video visit time with patient was spent on counseling and coordination of care. No changes this visit.  Pt and daughter are very happy with medication right now. We discussed her recent improvement with anxiety and depression. Paranoia and delusional thinking has been  absent for several months now.  Daughter indicated that no medication changes desired at this time. Dicussed the possibility of lowering her Abilify to 5 mg next visit if she is still stable. Discussed potential metabolic side effects associated with atypical antipsychotics, as well as potential risk for movement side effects. Advised pt to contact office if movement side effects occur.       Kory was seen today for anxiety, depression, follow-up, medication refill, memory loss and patient education.  Diagnoses and all orders for this visit:  Major depressive disorder, recurrent episode, moderate (HCC) -     OLANZapine (ZYPREXA) 10 MG tablet; Take 1 tablet (10 mg total) by mouth at bedtime. -     sertraline (ZOLOFT) 100 MG tablet; Take 2 tablets (200 mg total) by mouth daily.  Generalized anxiety disorder -     OLANZapine (ZYPREXA) 10 MG tablet; Take 1 tablet (10 mg total) by mouth at bedtime. -     sertraline (ZOLOFT) 100 MG tablet; Take 2 tablets (200 mg total) by mouth daily.  Cognitive decline -     OLANZapine (ZYPREXA) 10 MG tablet; Take 1 tablet (10 mg total) by mouth at bedtime.  Paranoia (HCC) -     sertraline (ZOLOFT) 100 MG tablet; Take 2 tablets (200 mg total) by mouth daily.     Please see After Visit Summary for patient specific instructions.  Future Appointments  Date Time Provider Department Center  07/08/2023  7:45 AM Ihor Austin, NP [facility] None  07/20/2023  7:35 AM [facility] DEVICE REMOTES [facility]  08/24/2023  7:35 AM [facility] DEVICE REMOTES [facility]  09/28/2023  7:35 AM [facility] DEVICE REMOTES [facility]    No orders of the defined types were placed in this encounter.     -------------------------------

## 2023-07-06 ENCOUNTER — Other Ambulatory Visit: Payer: Self-pay | Admitting: Adult Health

## 2023-07-06 NOTE — Telephone Encounter (Signed)
 Last seen on 08/18/22 per note " Continue Brilinta  and atorvastatin  for secondary stroke prevention measures "  Follow up scheduled on 07/08/23

## 2023-07-07 ENCOUNTER — Telehealth: Payer: Self-pay | Admitting: Adult Health

## 2023-07-07 NOTE — Telephone Encounter (Signed)
Appointment r/s pt's driver(daughter) is sick.  Pt has been r/s and is on wait list

## 2023-07-08 ENCOUNTER — Ambulatory Visit: Payer: Medicare HMO | Admitting: Adult Health

## 2023-07-20 ENCOUNTER — Ambulatory Visit (INDEPENDENT_AMBULATORY_CARE_PROVIDER_SITE_OTHER): Payer: Medicare HMO

## 2023-07-20 DIAGNOSIS — I639 Cerebral infarction, unspecified: Secondary | ICD-10-CM

## 2023-07-21 LAB — CUP PACEART REMOTE DEVICE CHECK
Date Time Interrogation Session: 20250223230358
Implantable Pulse Generator Implant Date: 20220126

## 2023-07-21 NOTE — Progress Notes (Unsigned)
 [facility] [address] [name] [address] [phone]       STROKE FOLLOW UP NOTE  [name] [name] Date of Birth:  1940/10/09 Medical Record Number:  [id number]   Primary neurologist: [name] [name] Sleep neurologist: [name] [name] Reason for visit: OSA on CPAP, hx of stroke and cognitive impairment    SUBJECTIVE:   CHIEF COMPLAINT:  No chief complaint on file.    HPI:   Update 07/22/2023 JM: Patient returns for follow-up visit.  Stable from stroke standpoint without new stroke/TIA symptoms.  Reports cognition stable ***, MMSE today ***/30 (prior 25/30).  Remains on Aricept, tolerating without side effects.  Continues to follow with behavioral health for depression/anxiety, feels symptoms stable on Zyprexa and Zoloft.  Also remains on Brilinta and atorvastatin.  Routinely follows with PCP for stroke risk factor management.  Loop recorder has not shown atrial fibrillation thus far.   CPAP ***       History provided for reference purposes only Update 08/18/2022 JM: Patient returns for routine follow-up accompanied by her daughter.  Stable from stroke standpoint without new stroke/TIA symptoms.  Patient feels like her cognition and memory has been relatively stable, per daughter some decline noted.  MMSE today 25/30 (prior 23/30).  Continues on Aricept 5 mg nightly.  Compliant on Brilinta and atorvastatin.  Blood pressure well-controlled.  Loop recorder has not shown atrial fibrillation thus far.  Reports use of BiPAP most nights, daughter needs to remind her to use. Reports tolerating well but does have leaks which can be bothersome.  Compliance report over the past 30 days shows 24 out of 30 usage days with 15 days greater than 4 hours for 50% compliance.  Residual AHI 8.6 on pressure settings of 9/5 with respiratory rate 12.  Update 05/21/2022 JM: Patient returns for initial BiPAP compliance visit.  She is accompanied by her daughter.  Download report as  noted below.  She does report occasional difficulty using greater than 4 hours due to some difficulty tolerating and other times may wake up in the middle the night to go to the bathroom and will forget to place mask back on. Daughter does note improvement of cognition after a night of using BiPAP.  Patient feels improvement of her sleep with use.  Epworth Sleepiness Scale 4/24.  No further questions or concerns regarding BiPAP.  Cognition relatively stable, does have good days/bad days  Denies any new stroke/TIA symptoms.  Compliant on all medications.  Blood pressure well-controlled.  Routinely follows with PCP [name] [name].       Update 02/06/2022 JM: Patient returns for 64-month stroke follow-up accompanied by her daughter  She has been stable from a stroke standpoint without new stroke/TIA symptoms.  Remains on Brilinta and atorvastatin.  Blood pressure well controlled.  Loop recorder has not shown atrial fibrillation thus far.  Cognition 23/30 (prior 25/30) Feels like her memory has been gradually declining, reports she can be "confused" at time (such as drawing a clock or unable to remember what she is supposed to be doing).  Mornings can be worse.  Still underlying anxiety as well as to be followed by behavioral health with medication changes back in June, per daughter believes anxiety/depression has been improving Remains on Aricept, denies side effects She continues to live alone maintains ADLs independently.  Family checks on her routinely.  Recently repeated sleep study which showed severe sleep apnea with a primary central component and recommended treatment with BiPAP after completion of titration  study, prior use of CPAP.  She is currently awaiting for machine.  She is scheduled to be seen with [name] [name] 12/26 for initial follow-up.  She does report using her CPAP machine still but unable to use throughout the whole duration of the night.  No further concerns at this time  Update  08/06/2021 JM: 83 year old female with history of multiple recurrent strokes and MCI.  Returns today for 57-month follow-up visit accompanied by her daughter, [name] [name].  Feels more shaky/unsteady while walking which has been a gradual decline since prior visit.  Besides doing things simple chores around her home, there is no routine activity or exercise.  She continues to use a roller walker, did have fall 2 weeks ago after waking up in recliner around 1am and while trying to walk to her bed, she fell on her bottom, did not hit head, no injury. Denies any other falls. Poor appetite but daughter ensures she eats appropriately. Sleeping well more recently. Occasional use of CPAP, at times can have difficulty putting mask on.  Denies new stroke/TIA symptoms.  Remains on Brilinta and atorvastatin without side effects.  Blood pressure today 136/74.  Loop recorder has not shown atrial fibrillation thus far.  Cognition stable with good days/bad days.  Remains on Aricept, denies side effects.  Depression and anxiety currently on Zyprexa and Zoloft, closely monitored and managed by behavioral health with reported improvement since prior visit per [facility] OV ntoe but per patient, no significant improvement - does get sad regarding loss of independence since her strokes.   Living alone and able to maintain ADLs independently.  Does have supportive family who assists with majority of IADLs.  Does do simple cleaning and cooking.  No further concerns at this time.  Update 02/04/2021 JM: [name] [name] returns for 100-month follow-up accompanied by her daughter, [name] [name].  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  Started on Zyprexa and donepezil and titrated home dose Zoloft.  She refused MRI during admission for further clarification of possible vascular dementia.  She also had  a fall while hospitalized resulting in [facility] - held Brilinta for 2 weeks per neurosurgery recommendations.  Also treated for UTI.  MOCA 8/15 23/30.  Recommended 24/7 care or placement in assisted living.  She was ultimately discharged home with family.  Since discharge, she has been doing well.  Daughter reports improvement of paranoia and psychosis on current regimen.  Family was initially providing 24/7 supervision but as she was doing well continue ADLs majority of IADLs independently, family started to leave her by herself a couples hrs a day since last week. She did have a fall yesterday with right leg giving out but thankfully without injury.  She is currently working with [facility] PT for strengthening and gait as well as lower back exercises and use of Rollator walker at all times.  Daughter still assists with medication and bill paying.  Cognition overall stable.  MMSE today 25/30.  Remains on Aricept.  Also remains on Brilinta and atorvastatin.  Blood pressure today 143/83.  No further concerns at this time   Update 11/05/2020 [name] [name]: She returns for follow-up after last visit with Shanda Bumps nurse practitioner 6 weeks ago.  She is accompanied by her daughter.  Their main complaint today is her memory loss which she has had since stroke.  Her short-term memory is poor.  She is  also continues to have problems with anxiety and paranoia.  She is currently being managed by [facility] psychiatry.  She initially tried Lexapro and Zoloft without benefit.  She is currently taking Seroquel at night but mostly to help her sleep.  She still having some mild anxiety and paranoid ideation.  She continues to have mild weakness in the right leg as well as left hand digits getting better.  She is tolerating Brilinta well without any bruising.  Blood pressures well controlled today it is 131/86.  She is tolerating Lipitor well without muscle aches and pains.  Patient is having short-term memory difficulties ever since her  stroke which is not getting better.  Her daughter started helping her do her bills and finances.  She still lives alone and is independent.  She has not had any work-up for reversible causes of memory loss.  Update 09/12/2020, Ihor Austin, NP ) [name] [name] returns for acute visit regarding new onset symptoms accompanied by her daughter  Approx 1 month ago, she suffered a fall after her right leg gave out.  She was fully evaluated in ED who felt likely due to chronic knee pain but per family request, CT head and MRI brain completed which was unremarkable for acute stroke.  Since that time, daughter has noticed worsening memory with increased times of confusion, forgetfulness and unsteadiness. Per daughter, symptoms onset around the time aspirin was discontinued.  Patient has chronic history of low back pain previously seen by Dr. Ethelene Hal in 10/2019 but no recent follow-up as this was relatively stable but over the past month, pain has been more consistent and more frequent episodes of bilateral hip weakness sensation and right leg weakness typically occurring after prolonged ambulation.  She does have scheduled visit with Dr. Ethelene Hal on 4/28 to undergo nerve block injections.   She does continue to live independently but her daughter checks on her routinely and assists with IADLs.  She has been greatly struggling with anxiety over the past month.  She does have history of anxiety but recently greatly worsened.  PCP recently started Lexapro and recommended increasing to 15 mg daily but she is very hesitant to increase as she read online Lexapro can worsen anxiety.  She is also been struggling with insomnia as her mind will constantly wander with hydroxyzine prescribed by PCP but she denies much benefit.  She speaks of difficulty with her relationships as she has been limiting social interactions due to her low back pain and fear of her right leg giving out - she also feels like she is constantly being judged by her  friends or worried about what they are thinking of her.  She is concerned if she misses a social event due to her low back pain and what sounds like social anxiety.  She is also fearful of undergoing nerve block injections as she is on Brilinta and feels like this should be discontinued prior to injection but was told by Dr. Ethelene Hal nurse assistant that that was not necessary.  Of note, seen by PCP [name] [name] 4/5 regarding these concerns who felt multifactorial and likely not new acute strokes.  She was also treated for shingles with Valtrex.  She has remained on Brilinta and atorvastatin without associated side effects Blood pressure 142/86 -occasionally monitored at home and typically stable She does endorse nightly compliance with CPAP for OSA management Loop recorder has not shown atrial fibrillation thus far  No further concerns at this time   Update 07/04/2020 JM:  [name] [name] returns for scheduled follow-up regarding prior strokes accompanied by her daughter. She has been stable since prior visit without any additional stroke/TIA symptoms. Reports residual left hand weakness and gait impairment. She will use RW at times especially upon awakening but otherwise doesn't need AD. She denies any recent falls.  She has remained on Brilinta, aspirin and atorvastatin 80 mg daily without side effects.  3-month aspirin and Brilinta duration will be completed at the end of this month.  Blood pressure today 121/74.  Monitors at home and typically stable.  Reports ongoing compliance with CPAP followed by [name] [name]. Due to concern of multiple strokes etiology cryptogenic, loop recorder placed by Dr. Johney Frame on 06/20/2020 for further long-term monitoring of atrial fibrillation.  No further concerns at this time.  Update 05/14/2020 JM: [name] [name] returns per request to follow-up on recent hospitalizations.  She is accompanied by her daughter.  Personally reviewed recent hospitalizations with summary provided below.   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  P2 Y 12 4 showing inadequate Plavix function therefore switched to aspirin 81 mg daily and Brilinta 90 mg twice daily for 3 months and then continue on Brilinta alone.  Initially recommended discharge to CIR but due to functional improvement she was discharged home on 04/23/2020 with home health therapies.  She then presented to [facility] ED on 05/08/2020 for 1 wk hx of intermittent LUE and LLE weakness, numbness/tingling and right-sided headache but due to wait time she left prior to full evaluation and went to [facility] ED with MRI reporting acute to early subacute infarct in the left centrum semiovale which was not consistent with presenting symptoms.  Per further review with neurologist, it was felt as though reported new infarct seen on prior MRIs therefore no evidence of new stroke.  Evaluated by neurology with note personally reviewed and felt possible cause of symptoms in setting of hypoperfusion with known intracranial arthrosclerosis and atenolol dosage decreased.  She then returned to [facility] ED yesterday, 05/13/2020, for left lower extremity heaviness, palpitations and headache.  Found to be hypertensive with BP 196/105.  No neurological deficits noted.  Symptoms resolved without intervention.  Mention of increased anxiety possibly contributing to symptoms.  CT head negative.  She was discharged back home in stable condition.  She reports continued intermittent b/l hand weakness where she will occasionally drop items and continued gait impairment currently working with [facility] PT. currently using rolling walker for ambulation.  Denies new or worsening stroke/TIA symptoms.  Remains on aspirin 81 mg daily and Brilinta without bleeding or bruising.  Remains on atorvastatin 80 mg daily without myalgias.  Blood pressure  today 128/75.  She does routinely monitor at home but she questions accuracy as her results are consistently 150s.  She does report nightly use of CPAP for OSA management.  Completed approximately 14 days of cardiac monitor which was negative for atrial fibrillation.  She questions need of ILR.  She does endorse increased anxiety regarding multiple strokes and WHEN (not if) another one will occur.  No further concerns at this time.  Update 03/01/2020 JM: [name] [name] returns for sooner scheduled visit per PCP request due to recent stroke accompanied by her daughter.  She presented to [facility] regional emergency room on 02/16/2020 with right-sided weakness contributing to fall.  MRI showed left frontal lobe infarcts likely from stenosis and left M2  all within the same vascular territory and was not felt to be embolic.  MRA head showed progression of stenosis within the distal A2/proximal artery which is now severe with additional intracranial atherosclerotic stenosis without interval change including severe focal stenosis within the cavernous left ICA, moderate focal stenosis within the cavernous right ICA, moderate stenosis with A2 right ACA and moderate stenosis within the proximal P2 left PCA.  2D echo showed EF of 60 to 65%.  On aspirin PTA and recommended DAPT " but long-term she may need Effient instead of Plavix or Brilinta".  Recommended to check Plavix assays outpatient to ensure adequate response.  HTN stable.  LDL 54 recommended continuation of atorvastatin 80 mg daily.  She did have right knee pain secondary to fall and recommended follow-up with orthopedics outpatient.  Evaluated by therapy initially recommended [facility] PT/OT but improved prior to discharge with [facility] therapy no longer indicated.  She was discharged home in stable condition and advised to follow-up with PCP, neurology and orthopedics.    She has been stable since returning home without new or reoccurring stroke/TIA symptoms.  Denies residual  right-sided symptoms but does continue to have chronic right knee pain and ankle pain post fall which has been limiting her ambulation.  She is currently working with [facility] PT and ambulating with rolling walker due to right leg pain.  Continues to have mild dysarthria and decreased LUE dexterity which has been stable after recent stroke and improvement since prior visit.  She has continued on aspirin and Plavix without bleeding or bruising.  Remains on atorvastatin 80 mg daily without myalgias.  Blood pressure today satisfactory at 112/69.  She reports intermittent use of CPAP for sleep apnea management with difficulty tolerating due to increased pressure.  Reports undergoing sleep study over 10 years ago and has not had any repeat study or follow-up to ensure adequate management of apnea.  Multiple questions regarding recent stroke and etiology as well as recommended Plavix lab work Research scientist (medical)) as recommended by [employer] neurologist.  Further reviewed recent admission and imaging with [name] [name] due to concern of stroke locations and possible embolic etiology. Per [name] [name], he does not believe recent stroke was due to large vessel disease but more so embolic secondary to unknown source as her recent imaging showed infarcts within different vascular locations including ACA and MCA.  Also reviewed imaging from prior stroke in 06/2019 and due to size, potentially embolic. He recommends further cardiac evaluation with placement of loop recorder to assess for possible atrial fibrillation as stroke etiology.  No indication for TEE. He recommends aspirin and Plavix for only 3-week duration and then Plavix alone.  No indication for P2 Y 12 testing as she has no history or evidence of Plavix failure or concern.  Update 01/02/2020 JM: [name] [name] is being seen for stroke follow-up previously followed through [facility] research participating in [place] stroke trial. Evaluated during stroke trial on 10/10/2019 recently  completing HH therapies with residual deficits of mild decrease left hand dexterity, imbalance and dysarthria therefore additional orders placed for outpatient therapies.  She does report ongoing improvement and continues to work with neuro rehab PT/OT/ST for residual dysarthria and dysphonia, gait impairment/imbalance and LUE incoordination. Evaluated in [place] by Dr. 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  Reports continued participation SLP with [facility] Reports imbalance greatest difficulty still with improvement but also underlying chronic lower back, bilateral hip and knee pain.  She wishes to  proceed with additional injections and nerve blocks therefore withdrew from research study approximately 1 month ago.  Continues to follow with [facility] She remains on aspirin 81mg  daily without bleeding or bruising. Continues on atorvastatin 80 mg daily without myalgias.  Recent lipid panel 10/13/2019 showed LDL 64.  Blood pressure today 116/70.  No further concerns.  Stroke admission 07/11/2019 Personally reviewed recent hospitalization pertinent notes, labs and imaging [name] [name] is a 83 y.o. female with history of HTN, HLD, obesity who presented on 07/11/2019 with dysarthria, L facial droop, mild L hemiparesis as well as hypertensive urgency.  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  Recommended DAPT for 3 weeks and aspirin alone.  BP upon arrival 202/110 stabilized during admission and recommended long-term BP goal normotensive range.  LDL 130 and initiate atorvastatin 80 mg daily. Other stroke risk factors include advanced age, EtOH use, OSA on CPAP and family reported hx imaging with evidence of small AAA.  Evaluated by therapy and recommend discharge to CIR for ongoing therapy needs but unfortunately did not by insurance therefore discharged home with home health therapy.  Also advised to follow-up  with [facility] research is interested in [employer] stroke trial.   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  MRI  R corona radiata and caudate head small vessel disease. infarcts CTA head & neck negative LVO, negative arthrosclerosis or stenosis in neck, positive for intracranial arthrosclerosis with significant stenosis within moderate to severe left ICA anterior genu, moderate bilateral ICA distally to and moderate left PCA P1 2D Echo  EF 50 to 55% LDL 130 HgbA1c 5.5 Lovenox 40 mg sq daily for VTE prophylaxis No antithrombotic prior to admission, now on clopidogrel 75 mg daily and aspirin 324 mg. Change aspirin to 81. Continue DAPT x 3 weeks then aspirin alone.  Therapy recommendations: CLR -insurance denied therefore recommended HH therapy Disposition: Home Recommend aspirin and Plavix for 3 weeks followed by aspirin alone. She is  participating in the [employer] stroke prevention trial( standard of care antiplatelets and Factor xi inhibitor versus standard of care antiplates and placebo)         ROS:   14 system review of systems performed and negative with exception of see HPI   PMH:  Past Medical History:  Diagnosis Date   Arthritis    knees   Depression    Fatty liver    GERD (gastroesophageal reflux disease)    Hyperlipidemia    Hypertension    IBS (irritable bowel syndrome)    Obesity    Pneumonia    Sleep apnea    uses CPAP   Stroke (cerebrum) (HCC)     PSH:  Past Surgical History:  Procedure Laterality Date   BLADDER SURGERY     BREAST SURGERY     breast biopsy   BROW LIFT Bilateral 04/14/2017   Procedure: BLEPHAROPLASTY UPPER EYELID WITH EXCESS SKIN;  Surgeon: Imagene Riches, MD;  Location: [facility];  Service: Ophthalmology;  Laterality: Bilateral;   CATARACT EXTRACTION W/PHACO Left 02/05/2016   Procedure: CATARACT EXTRACTION PHACO AND INTRAOCULAR LENS PLACEMENT (IOC);   Surgeon: Galen Manila, MD;  Location: [facility] OR[facility];  Service: Ophthalmology;  Laterality: Left;  [place] 01:10 AP% 22.3 CDE 15.67 Fluid pack lot # [id number]   CATARACT EXTRACTION W/PHACO Right 02/26/2016   Procedure: CATARACT EXTRACTION PHACO AND INTRAOCULAR LENS PLACEMENT (IOC);  Surgeon: Galen Manila, MD;  Location: [facility] OR[facility];  Service: Ophthalmology;  Laterality: Right;  [place] 57.4 AP% 24.0 CDE 13.75 Fluid Pack lot # [id number]   CHOLECYSTECTOMY     COLONOSCOPY WITH PROPOFOL N/A 12/18/2014   Procedure: COLONOSCOPY WITH PROPOFOL;  Surgeon: Scot Jun, MD;  Location: [facility];  Service: Endoscopy;  Laterality: N/A;   DEEP NECK LYMPH NODE BIOPSY / EXCISION     implantable loop recorder implantation  06/20/2020   Medtronic Reveal Linq model LNQ 22 ([id number] [id number]) implantable loop recorder for cryptogenic stroke   JOINT REPLACEMENT     KNEE ARTHROPLASTY Left 06/20/2015   Procedure: COMPUTER ASSISTED TOTAL KNEE ARTHROPLASTY;  Surgeon: Donato Heinz, MD;  Location: [facility] OR[facility];  Service: Orthopedics;  Laterality: Left;   PTOSIS REPAIR Bilateral 04/14/2017   Procedure: PTOSIS REPAIR RESECT EX;  Surgeon: Imagene Riches, MD;  Location: [facility];  Service: Ophthalmology;  Laterality: Bilateral;  sleep apnea   TONSILLECTOMY     TOTAL HIP ARTHROPLASTY Right 07/21/2018   Procedure: TOTAL HIP ARTHROPLASTY ANTERIOR APPROACH;  Surgeon: Ollen Gross, MD;  Location: [facility];  Service: Orthopedics;  Laterality: Right;    Social History:  Social History   Socioeconomic History   Marital status: Widowed    Spouse name: Not on file   Number of children: 3   Years of education: 73   Highest education level: Not on file  Occupational History   Occupation: retired  Tobacco Use   Smoking status: Never   Smokeless tobacco: Never  Vaping Use   Vaping status: Never Used  Substance and Sexual Activity   Alcohol use: Not Currently    Comment: ocassional glass of wine 2-3 times a year   Drug  use: No   Sexual activity: Not Currently  Other Topics Concern   Not on file  Social History Narrative   Lives alone in home   Right Handed   Drinks 1-2 cups caffeine daily   Social Drivers of Health   Financial Resource Strain: Low Risk  (05/14/2022)   Overall Financial Resource Strain (CARDIA)    Difficulty of Paying Living Expenses: Not very hard  Food Insecurity: No Food Insecurity (05/14/2022)   Hunger Vital Sign    Worried About Running Out of Food in the Last Year: Never true    Ran Out of Food in the Last Year: Never true  Transportation Needs: No Transportation Needs (05/14/2022)   PRAPARE - Administrator, Civil Service (Medical): No    Lack of Transportation (Non-Medical): No  Physical Activity: Insufficiently Active (05/14/2022)   Exercise Vital Sign    Days of Exercise per Week: 3 days    Minutes of Exercise per Session: 10 min  Stress: No Stress Concern Present (05/14/2022)   [employer] of Occupational Health - Occupational Stress Questionnaire    Feeling of Stress : Not at all  Social Connections: Socially Isolated (05/14/2022)   Social Connection and Isolation Panel [NHANES]    Frequency of Communication with Friends and Family: More than three times a week    Frequency of Social Gatherings with Friends and Family: More than three times a week    Attends Religious Services: Never    Database administrator or Organizations: No    Attends Banker Meetings: Never    Marital Status: Widowed  Intimate Partner Violence: Not At Risk (05/14/2022)   Humiliation, Afraid, Rape, and Kick questionnaire    Fear of Current or  Ex-Partner: No    Emotionally Abused: No    Physically Abused: No    Sexually Abused: No    Family History:  Family History  Problem Relation Age of Onset   Heart attack Mother    Dementia Father    Heart attack Father 35   Aortic aneurysm Brother    Stroke Paternal Grandfather    Colon cancer Neg Hx    Breast  cancer Neg Hx     Medications:   Current Outpatient Medications on File Prior to Visit  Medication Sig Dispense Refill   atorvastatin (LIPITOR) 40 MG tablet TAKE ONE TABLET (40 MG) BY MOUTH ONCE DAILY 30 tablet 0   Cholecalciferol (VITAMIN D3) 25 MCG (1000 UT) CAPS Take 1 capsule (1,000 Units total) by mouth daily.     colestipol (COLESTID) 1 g tablet TAKE 2 TABS IN THE MORNING AND 1 TAB IN EVENING; HOLD IF NO BM IN 24 HOURS AND REDUCE TO ONCE DAILY OF STOOLS BECOME TOO HARD. NOT WITHIN 4 HRS OF OTHER MEDS 90 tablet 3   donepezil (ARICEPT) 5 MG tablet Take 1 tablet (5 mg total) by mouth daily. 90 tablet 3   OLANZapine (ZYPREXA) 10 MG tablet TAKE 1 TABLET BY MOUTH NIGHTLY 30 tablet 5   OLANZapine (ZYPREXA) 10 MG tablet Take 1 tablet (10 mg total) by mouth at bedtime. 30 tablet 5   sertraline (ZOLOFT) 100 MG tablet Take 2 tablets (200 mg total) by mouth daily. 60 tablet 0   sertraline (ZOLOFT) 100 MG tablet Take 2 tablets (200 mg total) by mouth daily. 60 tablet 5   thiamine 100 MG tablet Take 1 tablet (100 mg total) by mouth daily. 90 tablet 1   ticagrelor (BRILINTA) 90 MG TABS tablet Take 1 tablet (90 mg total) by mouth 2 (two) times daily. Via [place] [employer] [place] pt assistance 180 tablet 1   No current facility-administered medications on file prior to visit.    Allergies:   Allergies  Allergen Reactions   Hydrocodone Nausea Only    Noted after surgery, may be able to tolerate with food      OBJECTIVE:  Physical Exam  There were no vitals filed for this visit.  There is no height or weight on file to calculate BMI. No results found.  General: well developed, well nourished, very pleasant elderly Caucasian female, seated, in no evident distress Neck: supple with no carotid or supraclavicular bruits Cardiovascular: regular rate and rhythm, no murmurs Vascular:  Normal pulses all extremities  Neurologic Exam Mental Status: Awake and fully alert.  Fluent speech and language.  Oriented to  place and time. Recent and remote memory impaired. Attention span, concentration and fund of knowledge mostly appropriate with daughter supplementing history. Mood and affect appropriate.     08/18/2022   10:35 AM 05/14/2022    3:33 PM 02/06/2022    1:12 PM  MMSE - Mini Mental State Exam  Not completed:  Unable to complete   Orientation to time 3  3  Orientation to Place 5  4  Registration 3  3  Attention/ Calculation 3  2  Recall 3  3  Language- name 2 objects 2  2  Language- repeat 1  0  Language- follow 3 step command 3  3  Language- read & follow direction 1  1  Write a sentence 1  1  Copy design 0  1  Total score 25  23   Cranial Nerves: Pupils equal, briskly reactive to  light. Extraocular movements full without nystagmus. Visual fields full to confrontation. Hearing intact. Facial sensation intact.  Tongue and palate moves normally and symmetrically.  Mild left lower facial weakness Motor: Normal bulk and tone. Normal strength in all tested extremity muscles except mild weakness in left hand and mild HF weakness Sensory.: intact to touch , pinprick , position and vibratory sensation.  Coordination: Rapid alternating movements normal in all extremities except decreased left hand. Finger-to-nose and heel-to-shin performed accurately bilaterally.  Gait and Station: Stands from seated position without difficulty.  Stance is slightly hunched.  Gait demonstrates relatively normal stride length with slight unsteadiness and imbalance with use of Rollator walker.  Tandem walking heel toe not attempted. Reflexes: 1+ and symmetric. Toes downgoing.       ASSESSMENT/PLAN: [name] [name] is a 83 y.o. 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  P2Y12 4 on Plavix.  2 additional ED evaluations 05/09/2020 and 05/13/2020 for worsening left-sided weakness and numbness without evidence of acute infarct contributing to symptoms and felt likely in setting of hypoperfusion with intracranial arthrosclerosis. 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      1.  Sleep apnea on BiPAP -Compliance report shows suboptimal usage with residual AHI 8.2.  Discussed importance of increasing nightly usage with ensuring greater than 4 hours per night for optimal benefit and for insurance purposes.  Will also request mask refitting with DME company due to occasional leaks currently at 27.9 L/min. Will keep current pressure settings at this time, will re-evaluate at f/u visit with hopeful improvement of compliance. If compliance improved but AHI remains elevated, may consider pressure setting adjustments at that time.  Continue to follow with DME company [employer] for any needed supplies or CPAP related concerns.  2.  Mild cognitive impairment -Prior MMSE ***/30 (prior 25/30) -Continue Aricept 5 mg daily managed by PCP -Continue to follow with [facility] for depression and anxiety management -Dementia panel 10/2020 unremarkable -EEG 10/2020 normal  3.  History of prior strokes -Continue Brilinta and atorvastatin for secondary stroke prevention measures -Loop recorder has not shown atrial fibrillation thus far -Maintain strict control of hypertension with blood pressure goal below 130/90, and lipids with LDL cholesterol goal below 70 mg percent     Follow-up in 6 months or call earlier if needed     I spent 36 minutes of face-to-face and non-face-to-face time with patient and daughter.  This included previsit chart review, lab review, study review, order entry, electronic health record documentation, patient and daughter discussion/education  regarding above diagnoses and treatment plan and answered all other questions to patient and daughter satisfaction  CC:  Joaquim Nam, MD     Ihor Austin, AGNP-BC  [facility] [address] [place], [place] [address]  Phone [phone] Fax [phone] Note: This document was prepared with digital dictation and possible smart phrase technology. Any transcriptional errors that result from this process are unintentional.

## 2023-07-22 ENCOUNTER — Ambulatory Visit (INDEPENDENT_AMBULATORY_CARE_PROVIDER_SITE_OTHER): Payer: Medicare HMO | Admitting: Adult Health

## 2023-07-22 ENCOUNTER — Encounter: Payer: Self-pay | Admitting: Adult Health

## 2023-07-22 VITALS — BP 138/84 | HR 75 | Wt 130.0 lb

## 2023-07-22 DIAGNOSIS — R251 Tremor, unspecified: Secondary | ICD-10-CM

## 2023-07-22 DIAGNOSIS — G4731 Primary central sleep apnea: Secondary | ICD-10-CM

## 2023-07-22 DIAGNOSIS — Z9989 Dependence on other enabling machines and devices: Secondary | ICD-10-CM | POA: Diagnosis not present

## 2023-07-22 DIAGNOSIS — Z8673 Personal history of transient ischemic attack (TIA), and cerebral infarction without residual deficits: Secondary | ICD-10-CM

## 2023-07-22 DIAGNOSIS — G3184 Mild cognitive impairment, so stated: Secondary | ICD-10-CM | POA: Diagnosis not present

## 2023-07-22 NOTE — Patient Instructions (Addendum)
 Ensure routine memory exercises as well as physical routine exercise, healthy diet, good sleep and management of stroke risk factors  Ensure nightly use of CPAP for adequate sleep apnea management, continue to follow with your DME company for any needs supplies or CPAP related concerns  Continue to follow with behavioral health for depression/anxiety management - discuss possibility of tremor with medications. If they do not believe due to medications, would follow up with your PCP for lab work including thyroid and B12  Continue Brilinta (ticagrelor) 90 mg bid  and atorvastatin for secondary stroke prevention  Continue to follow up with PCP regarding blood pressure and cholesterol management  Maintain strict control of hypertension with blood pressure goal below 130/90 and cholesterol with LDL cholesterol (bad cholesterol) goal below 70 mg/dL.   Signs of a Stroke? Follow the BEFAST method:  Balance Watch for a sudden loss of balance, trouble with coordination or vertigo Eyes Is there a sudden loss of vision in one or both eyes? Or double vision?  Face: Ask the person to smile. Does one side of the face droop or is it numb?  Arms: Ask the person to raise both arms. Does one arm drift downward? Is there weakness or numbness of a leg? Speech: Ask the person to repeat a simple phrase. Does the speech sound slurred/strange? Is the person confused ? Time: If you observe any of these signs, call 911.     Followup in the future with me in 1 year or call earlier if needed      Thank you for coming to see [place] at [facility]. I hope we have been able to provide you high quality care today.  You may receive a patient satisfaction survey over the next few weeks. We would appreciate your feedback and comments so that we may continue to improve ourselves and the health of our patients.

## 2023-07-23 NOTE — Progress Notes (Signed)
 Carelink Summary Report / Loop Recorder

## 2023-08-07 ENCOUNTER — Other Ambulatory Visit: Payer: Self-pay | Admitting: Adult Health

## 2023-08-17 ENCOUNTER — Encounter: Payer: Self-pay | Admitting: Family Medicine

## 2023-08-17 NOTE — Telephone Encounter (Signed)
 LOV: 12/08/22 NOV: NOTHING SCHEDULED LAST REFILL:ticagrelor (BRILINTA) 90 MG TABS tablet 04/25/2022 180 TABLETS 1 REFILL

## 2023-08-19 ENCOUNTER — Other Ambulatory Visit: Payer: Self-pay | Admitting: Family Medicine

## 2023-08-19 MED ORDER — TICAGRELOR 90 MG PO TABS: 90.0000 mg | ORAL_TABLET | Freq: Two times a day (BID) | ORAL | 0 refills | Status: DC

## 2023-08-19 MED ORDER — TICAGRELOR 90 MG PO TABS: 90.0000 mg | ORAL_TABLET | Freq: Two times a day (BID) | ORAL | 1 refills | Status: DC

## 2023-08-24 ENCOUNTER — Ambulatory Visit (INDEPENDENT_AMBULATORY_CARE_PROVIDER_SITE_OTHER): Payer: Medicare HMO

## 2023-08-24 DIAGNOSIS — I639 Cerebral infarction, unspecified: Secondary | ICD-10-CM

## 2023-08-24 LAB — CUP PACEART REMOTE DEVICE CHECK
Date Time Interrogation Session: 20250330230412
Implantable Pulse Generator Implant Date: 20220126

## 2023-08-25 NOTE — Addendum Note (Signed)
 Addended by: Geralyn Flash D on: 08/25/2023 02:20 PM   Modules accepted: Orders

## 2023-08-25 NOTE — Progress Notes (Signed)
 Carelink Summary Report / Loop Recorder

## 2023-09-07 ENCOUNTER — Other Ambulatory Visit: Payer: Self-pay | Admitting: Adult Health

## 2023-09-15 ENCOUNTER — Other Ambulatory Visit: Payer: Self-pay | Admitting: Family Medicine

## 2023-09-16 ENCOUNTER — Other Ambulatory Visit: Payer: Self-pay

## 2023-09-16 MED ORDER — TICAGRELOR 90 MG PO TABS: 90.0000 mg | ORAL_TABLET | Freq: Two times a day (BID) | ORAL | 1 refills | Status: AC

## 2023-09-17 ENCOUNTER — Telehealth: Payer: Self-pay | Admitting: Nurse Practitioner

## 2023-09-17 DIAGNOSIS — K529 Noninfective gastroenteritis and colitis, unspecified: Secondary | ICD-10-CM

## 2023-09-17 NOTE — Telephone Encounter (Signed)
 Requesting medication refill for colestide. Please advise.

## 2023-09-17 NOTE — Telephone Encounter (Signed)
**Note De-identified  [place] Obfuscation** Please advise 

## 2023-09-18 MED ORDER — COLESTIPOL HCL 1 G PO TABS: ORAL_TABLET | ORAL | 0 refills | Status: DC

## 2023-09-18 NOTE — Telephone Encounter (Signed)
**Note De-identified  [place] Obfuscation** Please advise 

## 2023-09-18 NOTE — Telephone Encounter (Signed)
RX sent. Patient is aware.

## 2023-09-18 NOTE — Telephone Encounter (Signed)
 DD, ok to send in RX for Colestid  1gm one tab po bid # 60, no refills. Pls schedule patient for a follow up appt as it has been more than 1 year since she has been seen in office and is required for any additional refills.  Thank you

## 2023-09-28 ENCOUNTER — Ambulatory Visit (INDEPENDENT_AMBULATORY_CARE_PROVIDER_SITE_OTHER): Payer: Medicare HMO

## 2023-09-28 DIAGNOSIS — I639 Cerebral infarction, unspecified: Secondary | ICD-10-CM

## 2023-09-28 LAB — CUP PACEART REMOTE DEVICE CHECK
Date Time Interrogation Session: 20250504230749
Implantable Pulse Generator Implant Date: 20220126

## 2023-10-01 ENCOUNTER — Other Ambulatory Visit: Payer: Self-pay | Admitting: Adult Health

## 2023-10-07 NOTE — Progress Notes (Signed)
 Carelink Summary Report / Loop Recorder

## 2023-10-21 ENCOUNTER — Other Ambulatory Visit: Payer: Self-pay | Admitting: Nurse Practitioner

## 2023-10-21 DIAGNOSIS — K529 Noninfective gastroenteritis and colitis, unspecified: Secondary | ICD-10-CM

## 2023-10-29 ENCOUNTER — Ambulatory Visit (INDEPENDENT_AMBULATORY_CARE_PROVIDER_SITE_OTHER)

## 2023-10-29 DIAGNOSIS — I639 Cerebral infarction, unspecified: Secondary | ICD-10-CM

## 2023-10-29 LAB — CUP PACEART REMOTE DEVICE CHECK
Date Time Interrogation Session: 20250604230909
Implantable Pulse Generator Implant Date: 20220126

## 2023-10-30 ENCOUNTER — Telehealth: Payer: Self-pay | Admitting: Nurse Practitioner

## 2023-10-30 DIAGNOSIS — K529 Noninfective gastroenteritis and colitis, unspecified: Secondary | ICD-10-CM

## 2023-10-30 NOTE — Telephone Encounter (Signed)
 Good afternoon [name] [name]  The following patient has an appointment with you on 6/10 and wants to request that it be a virtual appointment due to the fact that she is not very mobile anymore. Please advise if you are willing to take this as a virtual appointment. Thank you.

## 2023-11-02 MED ORDER — COLESTIPOL HCL 1 G PO TABS: ORAL_TABLET | ORAL | 2 refills | Status: DC

## 2023-11-02 NOTE — Addendum Note (Signed)
 Addended by: Glennette Lanius on: 11/02/2023 11:38 AM   Modules accepted: Orders

## 2023-11-02 NOTE — Telephone Encounter (Signed)
 Left message for patient to call back

## 2023-11-02 NOTE — Telephone Encounter (Signed)
 [name], since it has been more than one year since I have seen this 83 year old patient (last seen in office 03/2022) I do not recommend a virtual office visit. If she is only needing Colestid  refill, it might be appropriate for her PCP to prescribe if she has been seen by PCP within the past year. Dr. Elvin Hammer, do you have other recommendations as you are patient's primary GI?

## 2023-11-02 NOTE — Telephone Encounter (Signed)
 Patient daughter returning call. Please advise.   Thank you

## 2023-11-02 NOTE — Telephone Encounter (Signed)
 Spoke to patient's daughter, Ottie Blonder. She states that Ms.Ucci is doing "great from a diarrhea perspective." States the colestid  does well and she only needs refills. I advised that we can send some refills to the pharmacy without having patient to see us  since she is doing well. Also advised that this medication may be something Dr Vallarie Gauze would be okay filling in the future since he is seeing her more frequently. Ottie Blonder says they have an appointment with Dr Vallarie Gauze in July and will discuss with him at that time.   Appointment for 11/03/23 cancelled and refills of colestid  sent to pharmacy.

## 2023-11-03 ENCOUNTER — Ambulatory Visit: Admitting: Nurse Practitioner

## 2023-11-09 ENCOUNTER — Ambulatory Visit: Payer: Self-pay | Admitting: Cardiology

## 2023-11-16 NOTE — Progress Notes (Signed)
 Carelink Summary Report / Loop Recorder

## 2023-11-19 ENCOUNTER — Telehealth: Payer: Self-pay | Admitting: Family Medicine

## 2023-11-19 DIAGNOSIS — I10 Essential (primary) hypertension: Secondary | ICD-10-CM

## 2023-11-19 DIAGNOSIS — E7800 Pure hypercholesterolemia, unspecified: Secondary | ICD-10-CM

## 2023-11-19 DIAGNOSIS — Z111 Encounter for screening for respiratory tuberculosis: Secondary | ICD-10-CM

## 2023-11-19 NOTE — Telephone Encounter (Signed)
 Copied from CRM [id number]. Topic: General - Other >> Nov 19, 2023  2:05 PM Martinique E wrote: Reason for CRM: Patient's daughter is trying to get her mom into an assisted living facility, and patient will need a TB test done. Would this be able to get done at patient's physical on July 18th? If so, the daughter said that would work out great. Daughter also has to drop off some forms to the office, questioning when these can get dropped off. Callback number for Elvie is [phone].

## 2023-11-19 NOTE — Telephone Encounter (Signed)
 We can do the TB test at the [facility].  Please remind me at the visit.  I put in the order.  Please drop off the paperwork ahead of time.  Thanks.

## 2023-11-19 NOTE — Addendum Note (Signed)
 Addended by: CLEATUS ARLYSS RAMAN on: 11/19/2023 10:45 PM   Modules accepted: Orders

## 2023-11-23 ENCOUNTER — Ambulatory Visit: Payer: Medicare HMO | Admitting: Adult Health

## 2023-11-23 NOTE — Telephone Encounter (Signed)
 Left message per DPR to advise that TB Test can be done at visit and for any additional paperwork to please drop off prior to appointment

## 2023-11-26 ENCOUNTER — Telehealth: Payer: Self-pay | Admitting: Behavioral Health

## 2023-11-26 ENCOUNTER — Telehealth: Payer: Self-pay | Admitting: Family Medicine

## 2023-11-26 NOTE — Telephone Encounter (Signed)
 Type of forms received: FLMA   Routed to: Cleatus Six received by : Randall     Individual made aware of 3-5 business day turn around (Y/N): Y   Form completed and patient made aware of charges(Y/N): Y     Faxed to :Pt requested for the papers to be signed and completed of the next two weeks when she arrives on July 18th for her mother's appointment.    Form location:  Duncan's folder up front

## 2023-11-26 NOTE — Telephone Encounter (Signed)
 Pt's daughter, LVM @ 9:57a.  She is on DPR.  She states that pt is going to assisted living and she wants to discuss pt's medicines with [facility].  No upcoming appts scheduled.

## 2023-11-30 ENCOUNTER — Ambulatory Visit

## 2023-11-30 ENCOUNTER — Other Ambulatory Visit: Payer: Self-pay | Admitting: Adult Health

## 2023-11-30 DIAGNOSIS — I639 Cerebral infarction, unspecified: Secondary | ICD-10-CM

## 2023-11-30 NOTE — Telephone Encounter (Signed)
 LVM to [facility]

## 2023-11-30 NOTE — Telephone Encounter (Signed)
 Wasn't FMLA forms it was FL2 forms and other nursing home forms. Placed in PCP's inbox

## 2023-12-01 ENCOUNTER — Other Ambulatory Visit: Payer: Self-pay | Admitting: Family Medicine

## 2023-12-01 LAB — CUP PACEART REMOTE DEVICE CHECK
Date Time Interrogation Session: 20250706230634
Implantable Pulse Generator Implant Date: 20220126

## 2023-12-01 MED ORDER — OLANZAPINE 5 MG PO TABS: 5.0000 mg | ORAL_TABLET | Freq: Every day | ORAL | 1 refills | Status: AC

## 2023-12-01 NOTE — Telephone Encounter (Signed)
 Daughter reporting patient has moved to assisted living. Pt was really shaky and PCP felt it was related to meds rather than a physical condition. Dtr reports giving patient 1/2 of the 10 mg Zyprexa . She has been doing this for about 2 weeks and reports no issues. The ALF will administer meds and they need med list to match what is being given. Dtr wants Rx for 5 mg Zyprexa . Will send as appropriate.

## 2023-12-01 NOTE — Telephone Encounter (Signed)
 Left second VM to RC.

## 2023-12-01 NOTE — Addendum Note (Signed)
 Addended by: CON BRISTLE T on: 12/01/2023 04:19 PM   Modules accepted: Orders

## 2023-12-01 NOTE — Telephone Encounter (Signed)
 Sent Rx for 5 mg Zyprexa  to [facility], discontinued 10 mg.

## 2023-12-01 NOTE — Telephone Encounter (Signed)
 Please pen the Zyprexa  5 mg and they can request records from admin. Thanks

## 2023-12-01 NOTE — Telephone Encounter (Signed)
 I will work on the hard copy when possible.  Thanks.

## 2023-12-11 ENCOUNTER — Ambulatory Visit (INDEPENDENT_AMBULATORY_CARE_PROVIDER_SITE_OTHER): Admitting: Family Medicine

## 2023-12-11 ENCOUNTER — Telehealth: Payer: Self-pay | Admitting: Family Medicine

## 2023-12-11 ENCOUNTER — Encounter: Payer: Self-pay | Admitting: Family Medicine

## 2023-12-11 ENCOUNTER — Ambulatory Visit: Payer: Self-pay | Admitting: Family Medicine

## 2023-12-11 VITALS — BP 128/72 | HR 73 | Temp 98.7°F | Ht 59.76 in | Wt 126.8 lb

## 2023-12-11 DIAGNOSIS — I10 Essential (primary) hypertension: Secondary | ICD-10-CM

## 2023-12-11 DIAGNOSIS — Z8673 Personal history of transient ischemic attack (TIA), and cerebral infarction without residual deficits: Secondary | ICD-10-CM

## 2023-12-11 DIAGNOSIS — Z111 Encounter for screening for respiratory tuberculosis: Secondary | ICD-10-CM | POA: Diagnosis not present

## 2023-12-11 DIAGNOSIS — F4323 Adjustment disorder with mixed anxiety and depressed mood: Secondary | ICD-10-CM

## 2023-12-11 DIAGNOSIS — K529 Noninfective gastroenteritis and colitis, unspecified: Secondary | ICD-10-CM

## 2023-12-11 DIAGNOSIS — E7800 Pure hypercholesterolemia, unspecified: Secondary | ICD-10-CM

## 2023-12-11 DIAGNOSIS — R197 Diarrhea, unspecified: Secondary | ICD-10-CM | POA: Diagnosis not present

## 2023-12-11 DIAGNOSIS — R413 Other amnesia: Secondary | ICD-10-CM

## 2023-12-11 DIAGNOSIS — G4733 Obstructive sleep apnea (adult) (pediatric): Secondary | ICD-10-CM | POA: Diagnosis not present

## 2023-12-11 DIAGNOSIS — L989 Disorder of the skin and subcutaneous tissue, unspecified: Secondary | ICD-10-CM | POA: Diagnosis not present

## 2023-12-11 DIAGNOSIS — R4189 Other symptoms and signs involving cognitive functions and awareness: Secondary | ICD-10-CM

## 2023-12-11 LAB — LIPID PANEL
Cholesterol: 140 mg/dL (ref 0–200)
HDL: 57.9 mg/dL (ref >39.00)
LDL Cholesterol: 65 mg/dL (ref 0–99)
NonHDL: 81.71
Total CHOL/HDL Ratio: 2
Triglycerides: 82 mg/dL (ref 0.0–149.0)
VLDL: 16.4 mg/dL (ref 0.0–40.0)

## 2023-12-11 LAB — CBC WITH DIFFERENTIAL/PLATELET
Basophils Absolute: 0 K/uL (ref 0.0–0.1)
Basophils Relative: 0.5 % (ref 0.0–3.0)
Eosinophils Absolute: 0.1 K/uL (ref 0.0–0.7)
Eosinophils Relative: 0.9 % (ref 0.0–5.0)
HCT: 42.9 % (ref 36.0–46.0)
Hemoglobin: 14.1 g/dL (ref 12.0–15.0)
Lymphocytes Relative: 12.2 % (ref 12.0–46.0)
Lymphs Abs: 0.8 K/uL (ref 0.7–4.0)
MCHC: 32.9 g/dL (ref 30.0–36.0)
MCV: 91.9 fl (ref 78.0–100.0)
Monocytes Absolute: 0.3 K/uL (ref 0.1–1.0)
Monocytes Relative: 5.4 % (ref 3.0–12.0)
Neutro Abs: 5.1 K/uL (ref 1.4–7.7)
Neutrophils Relative %: 81 % — ABNORMAL HIGH (ref 43.0–77.0)
Platelets: 223 K/uL (ref 150.0–400.0)
RBC: 4.66 Mil/uL (ref 3.87–5.11)
RDW: 13.7 % (ref 11.5–15.5)
WBC: 6.3 K/uL (ref 4.0–10.5)

## 2023-12-11 LAB — COMPREHENSIVE METABOLIC PANEL WITH GFR
ALT: 9 U/L (ref 0–35)
AST: 16 U/L (ref 0–37)
Albumin: 4.5 g/dL (ref 3.5–5.2)
Alkaline Phosphatase: 102 U/L (ref 39–117)
BUN: 18 mg/dL (ref 6–23)
CO2: 26 meq/L (ref 19–32)
Calcium: 10.3 mg/dL (ref 8.4–10.5)
Chloride: 106 meq/L (ref 96–112)
Creatinine, Ser: 0.87 mg/dL (ref 0.40–1.20)
GFR: 62.57 mL/min (ref >60.00)
Glucose, Bld: 86 mg/dL (ref 70–99)
Potassium: 4.2 meq/L (ref 3.5–5.1)
Sodium: 144 meq/L (ref 135–145)
Total Bilirubin: 0.5 mg/dL (ref 0.2–1.2)
Total Protein: 6.6 g/dL (ref 6.0–8.3)

## 2023-12-11 LAB — VITAMIN B12: Vitamin B-12: 445 pg/mL (ref 211–911)

## 2023-12-11 LAB — TSH: TSH: 1.43 u[IU]/mL (ref 0.35–5.50)

## 2023-12-11 MED ORDER — COLESTIPOL HCL 1 G PO TABS: 2.0000 g | ORAL_TABLET | Freq: Every day | ORAL | Status: AC

## 2023-12-11 NOTE — Assessment & Plan Note (Signed)
 No acute changes.  Continue donepezil .

## 2023-12-11 NOTE — Telephone Encounter (Signed)
 I filled out her FL2 and other papers.  They are on AV's desk.    Please copy and scan.    Daughter is likely going to pick up a copy of the forms on Monday AM, even if the TB test isn't resulted.    TB test is pending at this point.  When resulted and if negative, then please print off a copy of the lab and have her pick that up.    Thanks.

## 2023-12-11 NOTE — Patient Instructions (Addendum)
 Go to the lab on the way out.   If you have mychart we'll likely use that to update you.    Take care.  Glad to see you. I'll work on Environmental manager.  Let me know if you can't get set up with dermatology.

## 2023-12-11 NOTE — Assessment & Plan Note (Signed)
 Per outside clinic.  Tremor improved with decrease in Zyprexa .

## 2023-12-11 NOTE — Assessment & Plan Note (Signed)
 Continue BiPAP use

## 2023-12-11 NOTE — Assessment & Plan Note (Signed)
 Continue statin and Brilinta .  See notes on labs.  Moving into assisted living.  See scanned forms.  Has loop recorder in place.

## 2023-12-11 NOTE — Assessment & Plan Note (Signed)
 Improved with colestipol .  Continue as is.

## 2023-12-11 NOTE — Progress Notes (Signed)
 ILR summary report received. Battery status OK. Normal device function. No new symptom, tachy, brady, or pause episodes. No new AF episodes. D/w pt.   H/o CVA.  Labs pending.  Still on brilinta  w/o bleeding. No aches on lipitor .  Balance is still at baseline, ie not completely normal.  She can occ get lightheaded.  She needs help showering and dressing.  The plan is for her to move to [facility] assisted living.  See scanned forms.  TB Gold test pending.  Still using BiPAP.  Compliant.  History of memory changes.  Still on aricept  w/o acute changes in memory.  Had previous neurology evaluation.  Per patient and family, memory has been stable recently.  Colestipol  taking daily w/o ADE on med.  It helped with diarrhea.  She takes it separate from other medications.  Termor improved with dec in zyprexa .  Followed by outside clinic for mood.  Skin lesion on back.  See exam.    Meds, vitals, and allergies reviewed.   ROS: Per HPI unless specifically indicated in ROS section   Nad Ncat Neck supple, no LA Rrr Ctab Abd soft, not ttp 1cm x1cm irritated red macule on the L upper back, near the shoulder.

## 2023-12-11 NOTE — Assessment & Plan Note (Signed)
 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  Discussed.  Refer to dermatology.

## 2023-12-14 ENCOUNTER — Other Ambulatory Visit: Payer: Self-pay | Admitting: Family Medicine

## 2023-12-14 NOTE — Telephone Encounter (Signed)
 Daughter arrived in office to pick up ppwk

## 2023-12-15 ENCOUNTER — Telehealth: Payer: Self-pay | Admitting: Family Medicine

## 2023-12-15 LAB — QUANTIFERON-TB GOLD PLUS
Mitogen-NIL: >10 [IU]/mL
NIL: 0.02 [IU]/mL
QuantiFERON-TB Gold Plus: NEGATIVE
TB1-NIL: <0 [IU]/mL
TB2-NIL: 0 [IU]/mL

## 2023-12-15 NOTE — Progress Notes (Signed)
 Carelink Summary Report / Loop Recorder

## 2023-12-15 NOTE — Telephone Encounter (Signed)
 Pt's daughter came in and picked up forms up front.

## 2023-12-21 ENCOUNTER — Encounter: Payer: Self-pay | Admitting: Dermatology

## 2023-12-21 ENCOUNTER — Ambulatory Visit: Admitting: Dermatology

## 2023-12-21 DIAGNOSIS — C44529 Squamous cell carcinoma of skin of other part of trunk: Secondary | ICD-10-CM

## 2023-12-21 DIAGNOSIS — L304 Erythema intertrigo: Secondary | ICD-10-CM

## 2023-12-21 DIAGNOSIS — D485 Neoplasm of uncertain behavior of skin: Secondary | ICD-10-CM

## 2023-12-21 DIAGNOSIS — D099 Carcinoma in situ, unspecified: Secondary | ICD-10-CM

## 2023-12-21 DIAGNOSIS — D045 Carcinoma in situ of skin of trunk: Secondary | ICD-10-CM | POA: Diagnosis not present

## 2023-12-21 HISTORY — DX: Carcinoma in situ, unspecified: D09.9

## 2023-12-21 NOTE — Progress Notes (Signed)
   New Patient Visit   Subjective  [name] [name] is a 83 y.o. female who presents for the following: spot at left shoulder, was bleeding and sore, occasionally itches. Present for a few months.   Patient accompanied by daughter who contributes to history. Recently moved to [facility].   The following portions of the chart were reviewed this encounter and updated as appropriate: medications, allergies, medical history  Review of Systems:  No other skin or systemic complaints except as noted in HPI or Assessment and Plan.  Objective  Well appearing patient in no apparent distress; mood and affect are within normal limits.   A focused examination was performed of the following areas: back  Relevant exam findings are noted in the Assessment and Plan.  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 Skin / nail biopsy Type of biopsy: tangential   Informed consent: discussed and consent obtained   Timeout: patient name, date of birth, surgical site, and procedure verified   Procedure prep:  Patient was prepped and draped in usual sterile fashion Prep type:  Isopropyl alcohol Anesthesia: the lesion was anesthetized in a standard fashion   Anesthetic:  1% lidocaine  w/ epinephrine  1-100,000 buffered w/ 8.4% NaHCO3 Instrument used: DermaBlade   Hemostasis achieved with: pressure and aluminum chloride   Outcome: patient tolerated procedure well   Post-procedure details: sterile dressing applied and wound care instructions given   Dressing type: bandage and petrolatum    Specimen 1 - Surgical pathology Differential Diagnosis: ISK vs SCC vs BCC  Check Margins: No INTERTRIGO    INTERTRIGO Exam: Not examined today. 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  It is exacerbated by increased movement / activity; sweating; and higher atmospheric temperature.  Use of an absorbant powder such as Zeasorb AF powder or other OTC antifungal powder to the area daily can prevent rash recurrence. Other options to help keep the area dry include blow drying the area after bathing or using antiperspirant products such as Duradry sweat minimizing gel.  Treatment Plan: Recommend OTC tummy liners daily and Zeasorb AF powder daily prn Contact us  if worsening for appointment. Would prescribe creams. Recommend appointment so I can see it first before prescribing  Return if symptoms worsen or fail to improve.  LILLETTE Lonell Drones, RMA, am acting as scribe for Boneta Sharps, MD .   Documentation: I have reviewed the above documentation for accuracy and completeness, and I agree with the above.  Boneta Sharps, MD

## 2023-12-21 NOTE — Patient Instructions (Addendum)
 [email or web address]    Zeasorb AF antifungal powder     Wound Care Instructions  Cleanse wound gently with soap and water  once a day then pat dry with clean gauze. Apply a thin coat of Petrolatum (petroleum jelly, Vaseline) over the wound (unless you have an allergy to this). We recommend that you use a new, sterile tube of Vaseline. Do not pick or remove scabs. Do not remove the yellow or white healing tissue from the base of the wound.  Cover the wound with fresh, clean, nonstick gauze and secure with paper tape. You may use Band-Aids in place of gauze and tape if the wound is small enough, but would recommend trimming much of the tape off as there is often too much. Sometimes Band-Aids can irritate the skin.  You should call the office for your biopsy report after 1 week if you have not already been contacted.  If you experience any problems, such as abnormal amounts of bleeding, swelling, significant bruising, significant pain, or evidence of infection, please call the office immediately.  FOR ADULT SURGERY PATIENTS: If you need something for pain relief you may take 1 extra strength Tylenol  (acetaminophen ) AND 2 Ibuprofen (200mg  each) together every 4 hours as needed for pain. (do not take these if you are allergic to them or if you have a reason you should not take them.) Typically, you may only need pain medication for 1 to 3 days.   Due to recent changes in healthcare laws, you may see results of your pathology and/or laboratory studies on MyChart before the doctors have had a chance to review them. We understand that in some cases there may be results that are confusing or concerning to you. Please understand that not all results are received at the same time and often the doctors may need to interpret multiple results in order to provide you with the best plan of care or course of treatment. Therefore, we ask that you please give us  2 business days to thoroughly review all your  results before contacting the office for clarification. Should we see a critical lab result, you will be contacted sooner.   If You Need Anything After Your Visit  If you have any questions or concerns for your doctor, please call our main line at [phone] and press option 4 to reach your doctor's medical assistant. If no one answers, please leave a voicemail as directed and we will return your call as soon as possible. Messages left after 4 pm will be answered the following business day.   You may also send us  a message via MyChart. We typically respond to MyChart messages within 1-2 business days.  For prescription refills, please ask your pharmacy to contact our office. Our fax number is [phone].  If you have an urgent issue when the clinic is closed that cannot wait until the next business day, you can page your doctor at the number below.    Please note that while we do our best to be available for urgent issues outside of office hours, we are not available 24/7.   If you have an urgent issue and are unable to reach us , you may choose to seek medical care at your doctor's office, retail clinic, urgent care center, or emergency room.  If you have a medical emergency, please immediately call 911 or go to the emergency department.  Pager Numbers  - Dr. Hester: [phone]  - Dr. Jackquline: [phone]  - Dr. Claudene: [phone]   In  the event of inclement weather, please call our main line at [phone] for an update on the status of any delays or closures.  Dermatology Medication Tips: Please keep the boxes that topical medications come in in order to help keep track of the instructions about where and how to use these. Pharmacies typically print the medication instructions only on the boxes and not directly on the medication tubes.   If your medication is too expensive, please contact our office at [phone] option 4 or send us  a message through MyChart.   We are  unable to tell what your co-pay for medications will be in advance as this is different depending on your insurance coverage. However, we may be able to find a substitute medication at lower cost or fill out paperwork to get insurance to cover a needed medication.   If a prior authorization is required to get your medication covered by your insurance company, please allow us  1-2 business days to complete this process.  Drug prices often vary depending on where the prescription is filled and some pharmacies may offer cheaper prices.  The website www.goodrx.com contains coupons for medications through different pharmacies. The prices here do not account for what the cost may be with help from insurance (it may be cheaper with your insurance), but the website can give you the price if you did not use any insurance.  - You can print the associated coupon and take it with your prescription to the pharmacy.  - You may also stop by our office during regular business hours and pick up a GoodRx coupon card.  - If you need your prescription sent electronically to a different pharmacy, notify our office through [facility] or by phone at [phone] option 4.     Si Usted Necesita Algo Despus de Su Visita  Tambin puede enviarnos un mensaje a travs de Clinical cytogeneticist. Por lo general respondemos a los mensajes de MyChart en el transcurso de 1 a 2 das hbiles.  Para renovar recetas, por favor pida a su farmacia que se ponga en contacto con nuestra oficina. Randi lakes de fax es [place] [phone].  Si tiene un asunto urgente cuando la clnica est cerrada y que no puede esperar hasta el siguiente da hbil, puede llamar/localizar a su doctor(a) al nmero que aparece a continuacin.   Por favor, tenga en cuenta que aunque hacemos todo lo posible para estar disponibles para asuntos urgentes fuera del horario de [place], no estamos disponibles las 24 horas del da, los 7 [address]  [address] de la [place].   Si tiene un  problema urgente y no puede comunicarse con nosotros, puede optar por buscar atencin mdica  en el consultorio de su doctor(a), en una clnica privada, en un centro de atencin urgente o en una sala de emergencias.  Si tiene Engineer, drilling, por favor llame inmediatamente al 911 o vaya a la sala de emergencias.  Nmeros de bper  - Dr. Hester: [phone]  - Dra. Jackquline: [phone]  - Dr. Claudene: [phone]   En caso de inclemencias del tiempo, por favor llame a landry capes principal al [phone] para una actualizacin sobre el [place] de cualquier retraso o cierre.  Consejos para la medicacin en dermatologa: Por favor, guarde las cajas en las que vienen los medicamentos de uso tpico para ayudarle a seguir las instrucciones sobre dnde y cmo usarlos. Las farmacias generalmente imprimen las instrucciones del medicamento slo en las cajas y no directamente en los tubos del [place].  Si su medicamento es muy caro, por favor, pngase en contacto con landry rieger llamando al [phone] y presione la opcin 4 o envenos un mensaje a travs de Clinical cytogeneticist.   No podemos decirle cul ser su copago por los medicamentos por adelantado ya que esto es diferente dependiendo de la cobertura de su seguro. Sin embargo, es posible que podamos encontrar un medicamento sustituto a Audiological scientist un formulario para que el seguro cubra el medicamento que se considera necesario.   Si se requiere una autorizacin previa para que su compaa de seguros malta su medicamento, por favor permtanos de 1 a 2 das hbiles para completar este proceso.  Los precios de los medicamentos varan con frecuencia dependiendo del Environmental consultant de dnde se surte la receta y alguna farmacias pueden ofrecer precios ms baratos.  El sitio web www.goodrx.com tiene cupones para medicamentos de Health and safety inspector. Los precios aqu no tienen en cuenta lo que podra costar con la ayuda del seguro (puede ser ms  barato con su seguro), pero el sitio web puede darle el precio si no utiliz Tourist information centre manager.  - Puede imprimir el cupn correspondiente y llevarlo con su receta a la farmacia.  - Tambin puede pasar por nuestra oficina durante el horario de atencin regular y Education officer, museum una tarjeta de cupones de GoodRx.  - Si necesita que su receta se enve electrnicamente a una farmacia diferente, informe a nuestra oficina a travs de MyChart de [place] o por telfono llamando al [phone] y presione la opcin 4.

## 2023-12-24 DIAGNOSIS — R4189 Other symptoms and signs involving cognitive functions and awareness: Secondary | ICD-10-CM | POA: Diagnosis not present

## 2023-12-24 DIAGNOSIS — F32A Depression, unspecified: Secondary | ICD-10-CM | POA: Diagnosis not present

## 2023-12-24 DIAGNOSIS — F419 Anxiety disorder, unspecified: Secondary | ICD-10-CM | POA: Diagnosis not present

## 2023-12-24 DIAGNOSIS — M1990 Unspecified osteoarthritis, unspecified site: Secondary | ICD-10-CM | POA: Diagnosis not present

## 2023-12-24 DIAGNOSIS — G4733 Obstructive sleep apnea (adult) (pediatric): Secondary | ICD-10-CM | POA: Diagnosis not present

## 2023-12-24 DIAGNOSIS — E559 Vitamin D deficiency, unspecified: Secondary | ICD-10-CM | POA: Diagnosis not present

## 2023-12-24 DIAGNOSIS — E785 Hyperlipidemia, unspecified: Secondary | ICD-10-CM | POA: Diagnosis not present

## 2023-12-24 DIAGNOSIS — I10 Essential (primary) hypertension: Secondary | ICD-10-CM | POA: Diagnosis not present

## 2023-12-24 LAB — SURGICAL PATHOLOGY

## 2023-12-26 ENCOUNTER — Ambulatory Visit: Payer: Self-pay | Admitting: Cardiology

## 2023-12-26 DIAGNOSIS — R262 Difficulty in walking, not elsewhere classified: Secondary | ICD-10-CM | POA: Diagnosis not present

## 2023-12-26 DIAGNOSIS — M6281 Muscle weakness (generalized): Secondary | ICD-10-CM | POA: Diagnosis not present

## 2023-12-27 ENCOUNTER — Ambulatory Visit: Payer: Self-pay | Admitting: Dermatology

## 2023-12-28 NOTE — Telephone Encounter (Signed)
 Patient's daughter has been advised of BX results. She would like to discuss with patient and patient's care givers to determine what treatment option is preferred. She will call back tomorrow. aw

## 2023-12-28 NOTE — Telephone Encounter (Signed)
-----   Message from [place] sent at 12/27/2023  7:12 PM EDT ----- Diagnosis left upper back :       SQUAMOUS CELL CARCINOMA IN SITU, HYPERTROPHIC, BASE INVOLVED, EXCORIATED    Please call with diagnosis and message me with patient's decision on treatment.   Explanation: Biopsy shows a squamous cell skin cancer limited to the top layer of skin. This means it is an early cancer and has not spread. However, it has the potential to spread beyond the skin  and threaten your health, so we recommend treating it.   Treatment option 1: a cream (fluorouracil and calcipotriene ) that helps your immune system clear the skin cancer. It will cause redness and irritation. Wait two weeks after the biopsy to start  applying the cream. Apply the cream twice per day until the redness and irritation develop (usually occurs by day 7), then stop and allow it to heal. We will recheck the area in 2 months to ensure  the cancer is gone. The cream is $45 plus shipping and will be mailed to you from a low cost compounding pharmacy.  Treatment option 2: you return for a brief appointment where I perform electrodesiccation and curettage [facility]). This involves three rounds of scraping and burning to destroy the skin cancer. It has  about an 85% cure rate and leaves a round wound slightly larger than the skin cancer and leaves a round white scar. No additional pathology is done. If the skin cancer comes back, we would need to do  a surgery to remove it.   ----- Message ----- From: Interface, Lab In [place] Sent: 12/24/2023   6:31 PM EDT To: Boneta Sharps, MD

## 2023-12-31 ENCOUNTER — Ambulatory Visit

## 2023-12-31 ENCOUNTER — Ambulatory Visit: Payer: Self-pay | Admitting: Cardiology

## 2023-12-31 DIAGNOSIS — R011 Cardiac murmur, unspecified: Secondary | ICD-10-CM | POA: Diagnosis not present

## 2023-12-31 DIAGNOSIS — I10 Essential (primary) hypertension: Secondary | ICD-10-CM | POA: Diagnosis not present

## 2023-12-31 DIAGNOSIS — R012 Other cardiac sounds: Secondary | ICD-10-CM | POA: Diagnosis not present

## 2023-12-31 DIAGNOSIS — I639 Cerebral infarction, unspecified: Secondary | ICD-10-CM

## 2023-12-31 DIAGNOSIS — E559 Vitamin D deficiency, unspecified: Secondary | ICD-10-CM | POA: Diagnosis not present

## 2023-12-31 DIAGNOSIS — D519 Vitamin B12 deficiency anemia, unspecified: Secondary | ICD-10-CM | POA: Diagnosis not present

## 2023-12-31 DIAGNOSIS — R262 Difficulty in walking, not elsewhere classified: Secondary | ICD-10-CM | POA: Diagnosis not present

## 2023-12-31 DIAGNOSIS — E038 Other specified hypothyroidism: Secondary | ICD-10-CM | POA: Diagnosis not present

## 2023-12-31 DIAGNOSIS — E782 Mixed hyperlipidemia: Secondary | ICD-10-CM | POA: Diagnosis not present

## 2023-12-31 DIAGNOSIS — M6281 Muscle weakness (generalized): Secondary | ICD-10-CM | POA: Diagnosis not present

## 2023-12-31 DIAGNOSIS — M1990 Unspecified osteoarthritis, unspecified site: Secondary | ICD-10-CM | POA: Diagnosis not present

## 2023-12-31 DIAGNOSIS — E785 Hyperlipidemia, unspecified: Secondary | ICD-10-CM | POA: Diagnosis not present

## 2023-12-31 LAB — CUP PACEART REMOTE DEVICE CHECK
Date Time Interrogation Session: 20250806231144
Implantable Pulse Generator Implant Date: 20220126

## 2024-01-01 DIAGNOSIS — R262 Difficulty in walking, not elsewhere classified: Secondary | ICD-10-CM | POA: Diagnosis not present

## 2024-01-01 DIAGNOSIS — M6281 Muscle weakness (generalized): Secondary | ICD-10-CM | POA: Diagnosis not present

## 2024-01-02 DIAGNOSIS — R262 Difficulty in walking, not elsewhere classified: Secondary | ICD-10-CM | POA: Diagnosis not present

## 2024-01-02 DIAGNOSIS — M6281 Muscle weakness (generalized): Secondary | ICD-10-CM | POA: Diagnosis not present

## 2024-01-04 DIAGNOSIS — M6281 Muscle weakness (generalized): Secondary | ICD-10-CM | POA: Diagnosis not present

## 2024-01-04 DIAGNOSIS — Z79899 Other long term (current) drug therapy: Secondary | ICD-10-CM | POA: Diagnosis not present

## 2024-01-04 DIAGNOSIS — R7309 Other abnormal glucose: Secondary | ICD-10-CM | POA: Diagnosis not present

## 2024-01-04 DIAGNOSIS — D519 Vitamin B12 deficiency anemia, unspecified: Secondary | ICD-10-CM | POA: Diagnosis not present

## 2024-01-04 DIAGNOSIS — E038 Other specified hypothyroidism: Secondary | ICD-10-CM | POA: Diagnosis not present

## 2024-01-04 DIAGNOSIS — E559 Vitamin D deficiency, unspecified: Secondary | ICD-10-CM | POA: Diagnosis not present

## 2024-01-04 DIAGNOSIS — R262 Difficulty in walking, not elsewhere classified: Secondary | ICD-10-CM | POA: Diagnosis not present

## 2024-01-04 DIAGNOSIS — E782 Mixed hyperlipidemia: Secondary | ICD-10-CM | POA: Diagnosis not present

## 2024-01-06 MED ORDER — FLUOROURACIL 5 % EX CREA: TOPICAL_CREAM | CUTANEOUS | 0 refills | Status: AC

## 2024-01-06 NOTE — Telephone Encounter (Signed)
 Patient's daughter called and they have decided to treat with topical 5FU/Calcipotriene  Cream. RX sent in and orders faxed to [facility] aw

## 2024-01-06 NOTE — Addendum Note (Signed)
 Addended by: TERESA PALMA R on: 01/06/2024 04:29 PM   Modules accepted: Orders

## 2024-01-08 DIAGNOSIS — R262 Difficulty in walking, not elsewhere classified: Secondary | ICD-10-CM | POA: Diagnosis not present

## 2024-01-08 DIAGNOSIS — M6281 Muscle weakness (generalized): Secondary | ICD-10-CM | POA: Diagnosis not present

## 2024-01-12 ENCOUNTER — Telehealth: Payer: Self-pay | Admitting: Family Medicine

## 2024-01-12 NOTE — Telephone Encounter (Signed)
 Contacted Rhoda JAYSON Igo to schedule their annual wellness visit. Patient declined to schedule AWV at this time.Patient is now in a assistant living facility, and using there doctors.   [facility] Care Guide [facility] AWV TEAM Direct Dial: [phone]

## 2024-01-14 DIAGNOSIS — M6281 Muscle weakness (generalized): Secondary | ICD-10-CM | POA: Diagnosis not present

## 2024-01-14 DIAGNOSIS — R262 Difficulty in walking, not elsewhere classified: Secondary | ICD-10-CM | POA: Diagnosis not present

## 2024-01-15 DIAGNOSIS — M6281 Muscle weakness (generalized): Secondary | ICD-10-CM | POA: Diagnosis not present

## 2024-01-15 DIAGNOSIS — R262 Difficulty in walking, not elsewhere classified: Secondary | ICD-10-CM | POA: Diagnosis not present

## 2024-01-22 DIAGNOSIS — R262 Difficulty in walking, not elsewhere classified: Secondary | ICD-10-CM | POA: Diagnosis not present

## 2024-01-22 DIAGNOSIS — M6281 Muscle weakness (generalized): Secondary | ICD-10-CM | POA: Diagnosis not present

## 2024-01-26 DIAGNOSIS — M6281 Muscle weakness (generalized): Secondary | ICD-10-CM | POA: Diagnosis not present

## 2024-01-26 DIAGNOSIS — R262 Difficulty in walking, not elsewhere classified: Secondary | ICD-10-CM | POA: Diagnosis not present

## 2024-01-30 DIAGNOSIS — R262 Difficulty in walking, not elsewhere classified: Secondary | ICD-10-CM | POA: Diagnosis not present

## 2024-01-30 DIAGNOSIS — M6281 Muscle weakness (generalized): Secondary | ICD-10-CM | POA: Diagnosis not present

## 2024-02-01 ENCOUNTER — Ambulatory Visit

## 2024-02-01 DIAGNOSIS — D519 Vitamin B12 deficiency anemia, unspecified: Secondary | ICD-10-CM | POA: Diagnosis not present

## 2024-02-01 DIAGNOSIS — M1990 Unspecified osteoarthritis, unspecified site: Secondary | ICD-10-CM | POA: Diagnosis not present

## 2024-02-01 DIAGNOSIS — E785 Hyperlipidemia, unspecified: Secondary | ICD-10-CM | POA: Diagnosis not present

## 2024-02-01 DIAGNOSIS — I639 Cerebral infarction, unspecified: Secondary | ICD-10-CM | POA: Diagnosis not present

## 2024-02-01 DIAGNOSIS — I10 Essential (primary) hypertension: Secondary | ICD-10-CM | POA: Diagnosis not present

## 2024-02-01 DIAGNOSIS — E559 Vitamin D deficiency, unspecified: Secondary | ICD-10-CM | POA: Diagnosis not present

## 2024-02-01 DIAGNOSIS — E038 Other specified hypothyroidism: Secondary | ICD-10-CM | POA: Diagnosis not present

## 2024-02-01 DIAGNOSIS — E782 Mixed hyperlipidemia: Secondary | ICD-10-CM | POA: Diagnosis not present

## 2024-02-01 LAB — CUP PACEART REMOTE DEVICE CHECK
Date Time Interrogation Session: 20250906231424
Implantable Pulse Generator Implant Date: 20220126

## 2024-02-02 ENCOUNTER — Ambulatory Visit: Payer: Self-pay | Admitting: Cardiology

## 2024-02-02 DIAGNOSIS — R262 Difficulty in walking, not elsewhere classified: Secondary | ICD-10-CM | POA: Diagnosis not present

## 2024-02-02 DIAGNOSIS — M6281 Muscle weakness (generalized): Secondary | ICD-10-CM | POA: Diagnosis not present

## 2024-02-04 DIAGNOSIS — R262 Difficulty in walking, not elsewhere classified: Secondary | ICD-10-CM | POA: Diagnosis not present

## 2024-02-04 DIAGNOSIS — M6281 Muscle weakness (generalized): Secondary | ICD-10-CM | POA: Diagnosis not present

## 2024-02-05 DIAGNOSIS — R0989 Other specified symptoms and signs involving the circulatory and respiratory systems: Secondary | ICD-10-CM | POA: Diagnosis not present

## 2024-02-11 DIAGNOSIS — R262 Difficulty in walking, not elsewhere classified: Secondary | ICD-10-CM | POA: Diagnosis not present

## 2024-02-11 DIAGNOSIS — M6281 Muscle weakness (generalized): Secondary | ICD-10-CM | POA: Diagnosis not present

## 2024-02-11 NOTE — Progress Notes (Signed)
 Remote Loop Recorder Transmission

## 2024-02-16 DIAGNOSIS — M6281 Muscle weakness (generalized): Secondary | ICD-10-CM | POA: Diagnosis not present

## 2024-02-16 DIAGNOSIS — R262 Difficulty in walking, not elsewhere classified: Secondary | ICD-10-CM | POA: Diagnosis not present

## 2024-02-18 DIAGNOSIS — F32A Depression, unspecified: Secondary | ICD-10-CM | POA: Diagnosis not present

## 2024-02-18 DIAGNOSIS — I10 Essential (primary) hypertension: Secondary | ICD-10-CM | POA: Diagnosis not present

## 2024-02-18 DIAGNOSIS — M1990 Unspecified osteoarthritis, unspecified site: Secondary | ICD-10-CM | POA: Diagnosis not present

## 2024-02-18 DIAGNOSIS — E785 Hyperlipidemia, unspecified: Secondary | ICD-10-CM | POA: Diagnosis not present

## 2024-02-18 DIAGNOSIS — G4733 Obstructive sleep apnea (adult) (pediatric): Secondary | ICD-10-CM | POA: Diagnosis not present

## 2024-02-18 DIAGNOSIS — F419 Anxiety disorder, unspecified: Secondary | ICD-10-CM | POA: Diagnosis not present

## 2024-02-18 DIAGNOSIS — E559 Vitamin D deficiency, unspecified: Secondary | ICD-10-CM | POA: Diagnosis not present

## 2024-02-18 DIAGNOSIS — R4189 Other symptoms and signs involving cognitive functions and awareness: Secondary | ICD-10-CM | POA: Diagnosis not present

## 2024-02-19 DIAGNOSIS — M6281 Muscle weakness (generalized): Secondary | ICD-10-CM | POA: Diagnosis not present

## 2024-02-19 DIAGNOSIS — R262 Difficulty in walking, not elsewhere classified: Secondary | ICD-10-CM | POA: Diagnosis not present

## 2024-02-20 NOTE — Progress Notes (Signed)
 Remote Loop Recorder Transmission

## 2024-02-27 DIAGNOSIS — M6281 Muscle weakness (generalized): Secondary | ICD-10-CM | POA: Diagnosis not present

## 2024-02-27 DIAGNOSIS — R262 Difficulty in walking, not elsewhere classified: Secondary | ICD-10-CM | POA: Diagnosis not present

## 2024-03-02 ENCOUNTER — Ambulatory Visit (INDEPENDENT_AMBULATORY_CARE_PROVIDER_SITE_OTHER)

## 2024-03-02 DIAGNOSIS — I639 Cerebral infarction, unspecified: Secondary | ICD-10-CM | POA: Diagnosis not present

## 2024-03-03 ENCOUNTER — Encounter

## 2024-03-03 DIAGNOSIS — M6281 Muscle weakness (generalized): Secondary | ICD-10-CM | POA: Diagnosis not present

## 2024-03-03 DIAGNOSIS — R262 Difficulty in walking, not elsewhere classified: Secondary | ICD-10-CM | POA: Diagnosis not present

## 2024-03-03 LAB — CUP PACEART REMOTE DEVICE CHECK
Date Time Interrogation Session: 20251007230601
Implantable Pulse Generator Implant Date: 20220126

## 2024-03-04 ENCOUNTER — Ambulatory Visit: Payer: Self-pay | Admitting: Cardiology

## 2024-03-07 NOTE — Progress Notes (Signed)
 Remote Loop Recorder Transmission

## 2024-03-08 DIAGNOSIS — B351 Tinea unguium: Secondary | ICD-10-CM | POA: Diagnosis not present

## 2024-03-08 DIAGNOSIS — I7091 Generalized atherosclerosis: Secondary | ICD-10-CM | POA: Diagnosis not present

## 2024-03-17 DIAGNOSIS — M1990 Unspecified osteoarthritis, unspecified site: Secondary | ICD-10-CM | POA: Diagnosis not present

## 2024-03-17 DIAGNOSIS — I10 Essential (primary) hypertension: Secondary | ICD-10-CM | POA: Diagnosis not present

## 2024-03-17 DIAGNOSIS — E785 Hyperlipidemia, unspecified: Secondary | ICD-10-CM | POA: Diagnosis not present

## 2024-03-17 DIAGNOSIS — E559 Vitamin D deficiency, unspecified: Secondary | ICD-10-CM | POA: Diagnosis not present

## 2024-03-17 DIAGNOSIS — G4733 Obstructive sleep apnea (adult) (pediatric): Secondary | ICD-10-CM | POA: Diagnosis not present

## 2024-03-17 DIAGNOSIS — F419 Anxiety disorder, unspecified: Secondary | ICD-10-CM | POA: Diagnosis not present

## 2024-03-17 DIAGNOSIS — R4189 Other symptoms and signs involving cognitive functions and awareness: Secondary | ICD-10-CM | POA: Diagnosis not present

## 2024-03-17 DIAGNOSIS — F32A Depression, unspecified: Secondary | ICD-10-CM | POA: Diagnosis not present

## 2024-03-23 DIAGNOSIS — E038 Other specified hypothyroidism: Secondary | ICD-10-CM | POA: Diagnosis not present

## 2024-03-23 DIAGNOSIS — M1990 Unspecified osteoarthritis, unspecified site: Secondary | ICD-10-CM | POA: Diagnosis not present

## 2024-03-23 DIAGNOSIS — E785 Hyperlipidemia, unspecified: Secondary | ICD-10-CM | POA: Diagnosis not present

## 2024-03-23 DIAGNOSIS — E559 Vitamin D deficiency, unspecified: Secondary | ICD-10-CM | POA: Diagnosis not present

## 2024-03-23 DIAGNOSIS — E782 Mixed hyperlipidemia: Secondary | ICD-10-CM | POA: Diagnosis not present

## 2024-03-23 DIAGNOSIS — I10 Essential (primary) hypertension: Secondary | ICD-10-CM | POA: Diagnosis not present

## 2024-03-23 DIAGNOSIS — D519 Vitamin B12 deficiency anemia, unspecified: Secondary | ICD-10-CM | POA: Diagnosis not present

## 2024-03-28 DIAGNOSIS — E559 Vitamin D deficiency, unspecified: Secondary | ICD-10-CM | POA: Diagnosis not present

## 2024-03-28 DIAGNOSIS — E038 Other specified hypothyroidism: Secondary | ICD-10-CM | POA: Diagnosis not present

## 2024-03-28 DIAGNOSIS — D519 Vitamin B12 deficiency anemia, unspecified: Secondary | ICD-10-CM | POA: Diagnosis not present

## 2024-03-28 DIAGNOSIS — E782 Mixed hyperlipidemia: Secondary | ICD-10-CM | POA: Diagnosis not present

## 2024-03-28 DIAGNOSIS — I10 Essential (primary) hypertension: Secondary | ICD-10-CM | POA: Diagnosis not present

## 2024-03-28 DIAGNOSIS — M1990 Unspecified osteoarthritis, unspecified site: Secondary | ICD-10-CM | POA: Diagnosis not present

## 2024-03-28 DIAGNOSIS — E785 Hyperlipidemia, unspecified: Secondary | ICD-10-CM | POA: Diagnosis not present

## 2024-04-02 ENCOUNTER — Encounter

## 2024-04-03 ENCOUNTER — Ambulatory Visit: Attending: Family Medicine

## 2024-04-04 ENCOUNTER — Encounter

## 2024-05-03 ENCOUNTER — Encounter

## 2024-05-04 ENCOUNTER — Ambulatory Visit: Attending: Cardiology

## 2024-05-05 ENCOUNTER — Ambulatory Visit: Payer: Self-pay | Admitting: Cardiology

## 2024-05-05 LAB — CUP PACEART REMOTE DEVICE CHECK
Date Time Interrogation Session: 20251209230729
Implantable Pulse Generator Implant Date: 20220126

## 2024-05-11 NOTE — Progress Notes (Signed)
 Remote Loop Recorder Transmission

## 2024-06-03 ENCOUNTER — Encounter

## 2024-06-04 ENCOUNTER — Ambulatory Visit

## 2024-06-04 DIAGNOSIS — I639 Cerebral infarction, unspecified: Secondary | ICD-10-CM

## 2024-06-06 LAB — CUP PACEART REMOTE DEVICE CHECK
Date Time Interrogation Session: 20260109230232
Implantable Pulse Generator Implant Date: 20220126

## 2024-06-07 ENCOUNTER — Ambulatory Visit: Payer: Self-pay | Admitting: Cardiology

## 2024-06-07 NOTE — Progress Notes (Signed)
 Remote Loop Recorder Transmission

## 2024-07-04 ENCOUNTER — Encounter

## 2024-07-05 ENCOUNTER — Ambulatory Visit

## 2024-07-20 ENCOUNTER — Ambulatory Visit: Payer: Medicare HMO | Admitting: Adult Health

## 2024-08-04 ENCOUNTER — Encounter

## 2024-08-05 ENCOUNTER — Ambulatory Visit
